# Patient Record
Sex: Male | Born: 1935
Health system: Southern US, Community
[De-identification: ages and names within clinical notes are randomized; demographics above are authoritative.]

## PROBLEM LIST (undated history)

## (undated) DIAGNOSIS — D649 Anemia, unspecified: Secondary | ICD-10-CM

## (undated) DIAGNOSIS — J449 Chronic obstructive pulmonary disease, unspecified: Secondary | ICD-10-CM

## (undated) DIAGNOSIS — Z992 Dependence on renal dialysis: Secondary | ICD-10-CM

## (undated) DIAGNOSIS — N189 Chronic kidney disease, unspecified: Secondary | ICD-10-CM

## (undated) DIAGNOSIS — M1990 Unspecified osteoarthritis, unspecified site: Secondary | ICD-10-CM

## (undated) DIAGNOSIS — I10 Essential (primary) hypertension: Secondary | ICD-10-CM

## (undated) DIAGNOSIS — R001 Bradycardia, unspecified: Secondary | ICD-10-CM

## (undated) DIAGNOSIS — R011 Cardiac murmur, unspecified: Secondary | ICD-10-CM

## (undated) DIAGNOSIS — I2510 Atherosclerotic heart disease of native coronary artery without angina pectoris: Secondary | ICD-10-CM

## (undated) DIAGNOSIS — Z9889 Other specified postprocedural states: Secondary | ICD-10-CM

## (undated) DIAGNOSIS — K922 Gastrointestinal hemorrhage, unspecified: Secondary | ICD-10-CM

## (undated) DIAGNOSIS — J9610 Chronic respiratory failure, unspecified whether with hypoxia or hypercapnia: Secondary | ICD-10-CM

## (undated) DIAGNOSIS — K219 Gastro-esophageal reflux disease without esophagitis: Secondary | ICD-10-CM

## (undated) DIAGNOSIS — L409 Psoriasis, unspecified: Secondary | ICD-10-CM

## (undated) DIAGNOSIS — E119 Type 2 diabetes mellitus without complications: Secondary | ICD-10-CM

## (undated) DIAGNOSIS — R55 Syncope and collapse: Secondary | ICD-10-CM

## (undated) DIAGNOSIS — E7800 Pure hypercholesterolemia, unspecified: Secondary | ICD-10-CM

## (undated) DIAGNOSIS — I4430 Unspecified atrioventricular block: Secondary | ICD-10-CM

## (undated) DIAGNOSIS — N186 End stage renal disease: Secondary | ICD-10-CM

## (undated) HISTORY — DX: Pure hypercholesterolemia, unspecified: E78.00

## (undated) HISTORY — DX: Essential (primary) hypertension: I10

## (undated) HISTORY — DX: Unspecified atrioventricular block: I44.30

## (undated) HISTORY — DX: Bradycardia, unspecified: R00.1

## (undated) HISTORY — DX: Type 2 diabetes mellitus without complications: E11.9

## (undated) HISTORY — DX: Chronic respiratory failure, unspecified whether with hypoxia or hypercapnia: J96.10

## (undated) HISTORY — DX: Psoriasis, unspecified: L40.9

## (undated) HISTORY — DX: Other specified postprocedural states: Z98.890

## (undated) HISTORY — DX: Unspecified osteoarthritis, unspecified site: M19.90

## (undated) HISTORY — DX: End stage renal disease: N18.6

## (undated) HISTORY — DX: Syncope and collapse: R55

## (undated) HISTORY — DX: Anemia, unspecified: D64.9

## (undated) HISTORY — DX: Atherosclerotic heart disease of native coronary artery without angina pectoris: I25.10

## (undated) HISTORY — DX: Gastrointestinal hemorrhage, unspecified: K92.2

## (undated) HISTORY — PX: VASECTOMY: SHX75

## (undated) HISTORY — DX: Chronic obstructive pulmonary disease, unspecified: J44.9

---

## 2007-01-25 HISTORY — PX: CARDIAC CATHETERIZATION: SHX172

## 2011-01-28 ENCOUNTER — Other Ambulatory Visit: Payer: Self-pay | Admitting: Family Medicine

## 2011-01-28 ENCOUNTER — Ambulatory Visit
Admission: RE | Admit: 2011-01-28 | Discharge: 2011-01-28 | Disposition: A | Discharge status: Home / Self Care | Payer: Medicare Other | Source: Ambulatory Visit | Attending: Family Medicine | Admitting: Family Medicine

## 2011-01-28 DIAGNOSIS — M25559 Pain in unspecified hip: Secondary | ICD-10-CM | POA: Diagnosis not present

## 2011-01-28 DIAGNOSIS — M25552 Pain in left hip: Secondary | ICD-10-CM

## 2011-01-28 DIAGNOSIS — M169 Osteoarthritis of hip, unspecified: Secondary | ICD-10-CM | POA: Diagnosis not present

## 2011-01-28 IMAGING — CR DG HIP (WITH OR WITHOUT PELVIS) 2-3V*L*
2 series · 2 of 2 positions shown · non-contrast
Comparison: None.

CLINICAL DATA: Chronic left hip pain and difficulty ambulating.

LEFT HIP - COMPLETE 2+ VIEW

[view not recorded (1 of 2)]
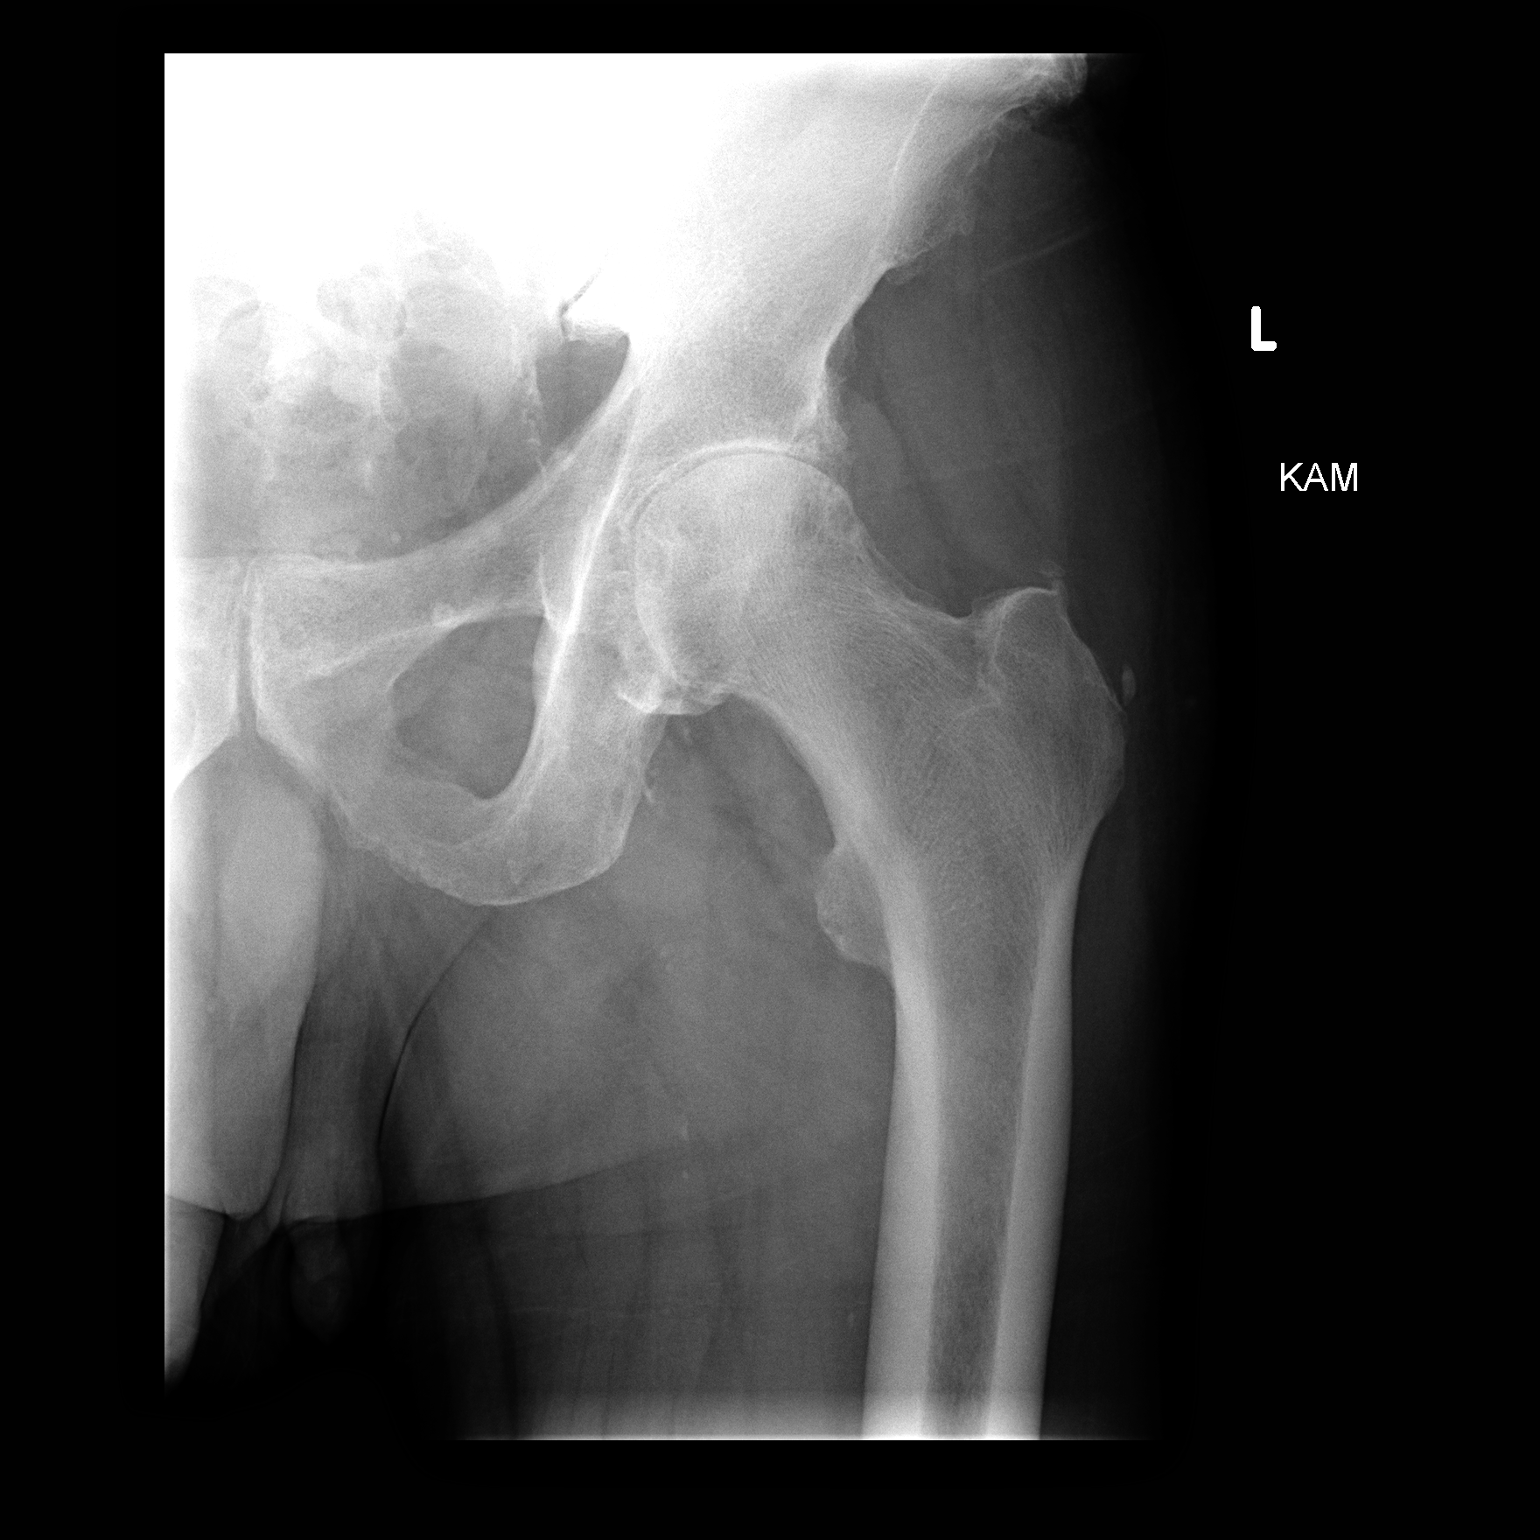

[view not recorded (2 of 2)]
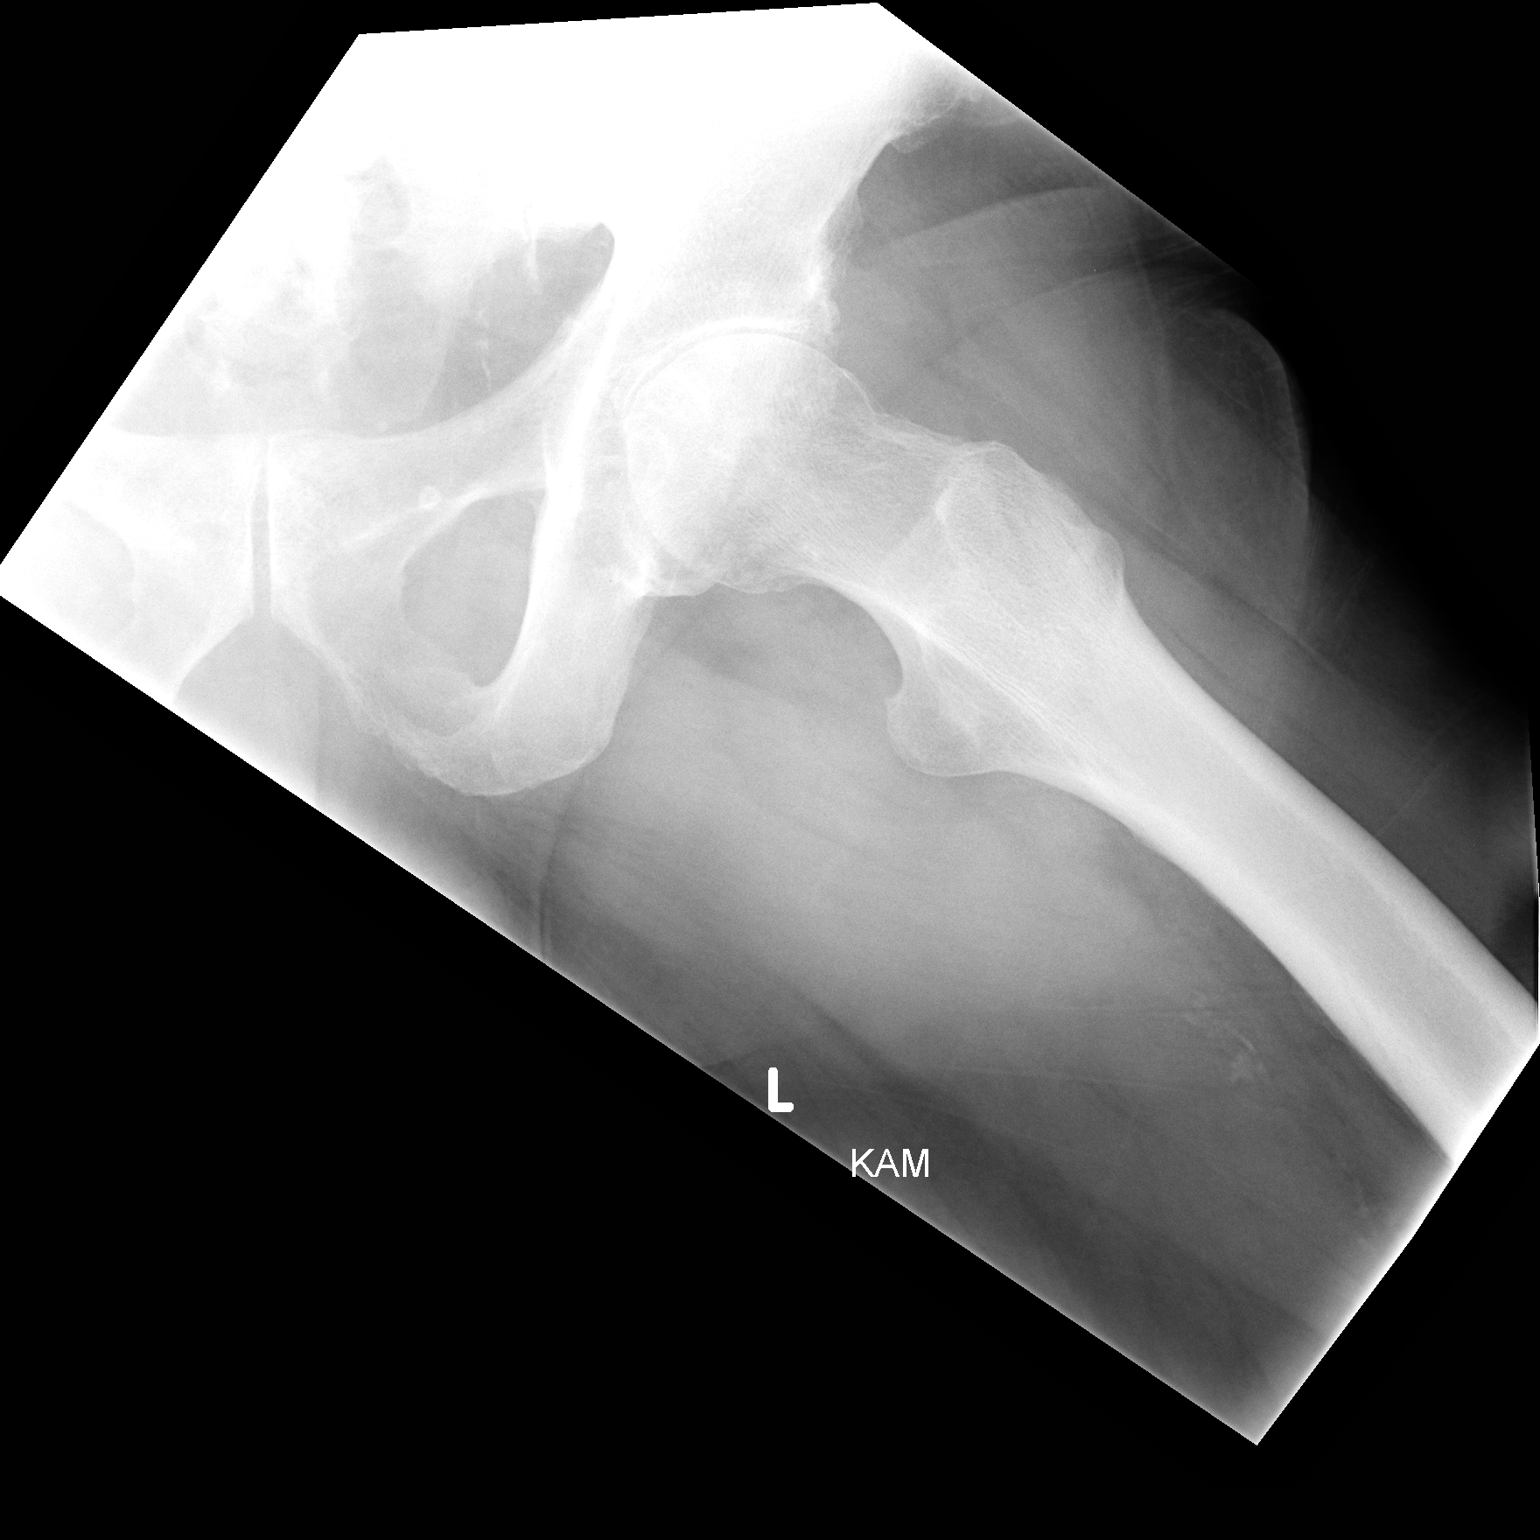

[2 of 2 positions shown; findings below may reference images not displayed]

FINDINGS: No evidence of left hip fracture or dislocation.

Advanced osteoarthritis is seen involving the left hip joint, with
near complete joint space loss.  Mild associated degenerative
sclerosis and spurring also noted.  No other significant bone
abnormality identified.
IMPRESSION: 1.  No acute findings.
2.  Advanced left hip osteoarthritis.

## 2011-02-03 DIAGNOSIS — M25559 Pain in unspecified hip: Secondary | ICD-10-CM | POA: Diagnosis not present

## 2011-02-03 DIAGNOSIS — M169 Osteoarthritis of hip, unspecified: Secondary | ICD-10-CM | POA: Diagnosis not present

## 2011-03-01 DIAGNOSIS — M25559 Pain in unspecified hip: Secondary | ICD-10-CM | POA: Diagnosis not present

## 2011-03-07 DIAGNOSIS — E1159 Type 2 diabetes mellitus with other circulatory complications: Secondary | ICD-10-CM | POA: Diagnosis not present

## 2011-03-07 DIAGNOSIS — B351 Tinea unguium: Secondary | ICD-10-CM | POA: Diagnosis not present

## 2011-03-07 DIAGNOSIS — L851 Acquired keratosis [keratoderma] palmaris et plantaris: Secondary | ICD-10-CM | POA: Diagnosis not present

## 2011-03-31 DIAGNOSIS — H6120 Impacted cerumen, unspecified ear: Secondary | ICD-10-CM | POA: Diagnosis not present

## 2011-05-16 DIAGNOSIS — L851 Acquired keratosis [keratoderma] palmaris et plantaris: Secondary | ICD-10-CM | POA: Diagnosis not present

## 2011-05-16 DIAGNOSIS — B351 Tinea unguium: Secondary | ICD-10-CM | POA: Diagnosis not present

## 2011-05-16 DIAGNOSIS — E1159 Type 2 diabetes mellitus with other circulatory complications: Secondary | ICD-10-CM | POA: Diagnosis not present

## 2011-05-27 DIAGNOSIS — N184 Chronic kidney disease, stage 4 (severe): Secondary | ICD-10-CM | POA: Diagnosis not present

## 2011-07-25 DIAGNOSIS — L851 Acquired keratosis [keratoderma] palmaris et plantaris: Secondary | ICD-10-CM | POA: Diagnosis not present

## 2011-07-25 DIAGNOSIS — E1159 Type 2 diabetes mellitus with other circulatory complications: Secondary | ICD-10-CM | POA: Diagnosis not present

## 2011-07-25 DIAGNOSIS — B351 Tinea unguium: Secondary | ICD-10-CM | POA: Diagnosis not present

## 2011-10-07 DIAGNOSIS — L851 Acquired keratosis [keratoderma] palmaris et plantaris: Secondary | ICD-10-CM | POA: Diagnosis not present

## 2011-10-07 DIAGNOSIS — B351 Tinea unguium: Secondary | ICD-10-CM | POA: Diagnosis not present

## 2011-10-07 DIAGNOSIS — E1159 Type 2 diabetes mellitus with other circulatory complications: Secondary | ICD-10-CM | POA: Diagnosis not present

## 2011-10-24 DIAGNOSIS — Z23 Encounter for immunization: Secondary | ICD-10-CM | POA: Diagnosis not present

## 2011-10-31 DIAGNOSIS — L851 Acquired keratosis [keratoderma] palmaris et plantaris: Secondary | ICD-10-CM | POA: Diagnosis not present

## 2011-10-31 DIAGNOSIS — B351 Tinea unguium: Secondary | ICD-10-CM | POA: Diagnosis not present

## 2011-10-31 DIAGNOSIS — E1159 Type 2 diabetes mellitus with other circulatory complications: Secondary | ICD-10-CM | POA: Diagnosis not present

## 2011-11-15 DIAGNOSIS — L03039 Cellulitis of unspecified toe: Secondary | ICD-10-CM | POA: Diagnosis not present

## 2011-11-30 DIAGNOSIS — Z09 Encounter for follow-up examination after completed treatment for conditions other than malignant neoplasm: Secondary | ICD-10-CM | POA: Diagnosis not present

## 2011-12-19 DIAGNOSIS — L851 Acquired keratosis [keratoderma] palmaris et plantaris: Secondary | ICD-10-CM | POA: Diagnosis not present

## 2011-12-19 DIAGNOSIS — E1159 Type 2 diabetes mellitus with other circulatory complications: Secondary | ICD-10-CM | POA: Diagnosis not present

## 2011-12-19 DIAGNOSIS — B351 Tinea unguium: Secondary | ICD-10-CM | POA: Diagnosis not present

## 2012-02-27 DIAGNOSIS — B351 Tinea unguium: Secondary | ICD-10-CM | POA: Diagnosis not present

## 2012-02-27 DIAGNOSIS — E1159 Type 2 diabetes mellitus with other circulatory complications: Secondary | ICD-10-CM | POA: Diagnosis not present

## 2012-02-27 DIAGNOSIS — L851 Acquired keratosis [keratoderma] palmaris et plantaris: Secondary | ICD-10-CM | POA: Diagnosis not present

## 2012-03-28 ENCOUNTER — Encounter (INDEPENDENT_AMBULATORY_CARE_PROVIDER_SITE_OTHER): Payer: Self-pay | Admitting: General Surgery

## 2012-03-28 DIAGNOSIS — K4090 Unilateral inguinal hernia, without obstruction or gangrene, not specified as recurrent: Secondary | ICD-10-CM | POA: Diagnosis not present

## 2012-03-28 DIAGNOSIS — Z1331 Encounter for screening for depression: Secondary | ICD-10-CM | POA: Diagnosis not present

## 2012-03-29 ENCOUNTER — Encounter (INDEPENDENT_AMBULATORY_CARE_PROVIDER_SITE_OTHER): Payer: Self-pay | Admitting: General Surgery

## 2012-03-29 ENCOUNTER — Ambulatory Visit (INDEPENDENT_AMBULATORY_CARE_PROVIDER_SITE_OTHER): Payer: Medicare Other | Admitting: General Surgery

## 2012-03-29 VITALS — BP 115/72 | HR 70 | Temp 98.0°F | Resp 16 | Wt 180.6 lb

## 2012-03-29 DIAGNOSIS — K4030 Unilateral inguinal hernia, with obstruction, without gangrene, not specified as recurrent: Secondary | ICD-10-CM

## 2012-03-29 MED ORDER — TAMSULOSIN HCL 0.4 MG PO CAPS: 0.4000 mg | ORAL_CAPSULE | Freq: Every day | ORAL | Status: DC

## 2012-03-29 NOTE — Patient Instructions (Signed)
Pick up prescription at pharmacy and start taking 2 days before surgery Do not take Levitra while taking flomax  Hernia A hernia occurs when an internal organ pushes out through a weak spot in the abdominal wall. Hernias most commonly occur in the groin and around the navel. Hernias often can be pushed back into place (reduced). Most hernias tend to get worse over time. Some abdominal hernias can get stuck in the opening (irreducible or incarcerated hernia) and cannot be reduced. An irreducible abdominal hernia which is tightly squeezed into the opening is at risk for impaired blood supply (strangulated hernia). A strangulated hernia is a medical emergency. Because of the risk for an irreducible or strangulated hernia, surgery may be recommended to repair a hernia. CAUSES   Heavy lifting.  Prolonged coughing.  Straining to have a bowel movement.  A cut (incision) made during an abdominal surgery. HOME CARE INSTRUCTIONS   Bed rest is not required. You may continue your normal activities.  Avoid lifting more than 10 pounds (4.5 kg) or straining.  Cough gently. If you are a smoker it is best to stop. Even the best hernia repair can break down with the continual strain of coughing. Even if you do not have your hernia repaired, a cough will continue to aggravate the problem.  Do not wear anything tight over your hernia. Do not try to keep it in with an outside bandage or truss. These can damage abdominal contents if they are trapped within the hernia sac.  Eat a normal diet.  Avoid constipation. Straining over long periods of time will increase hernia size and encourage breakdown of repairs. If you cannot do this with diet alone, stool softeners may be used. SEEK IMMEDIATE MEDICAL CARE IF:   You have a fever.  You develop increasing abdominal pain.  You feel nauseous or vomit.  Your hernia is stuck outside the abdomen, looks discolored, feels hard, or is tender.  You have any changes  in your bowel habits or in the hernia that are unusual for you.  You have increased pain or swelling around the hernia.  You cannot push the hernia back in place by applying gentle pressure while lying down. MAKE SURE YOU:   Understand these instructions.  Will watch your condition.  Will get help right away if you are not doing well or get worse. Document Released: 01/10/2005 Document Revised: 04/04/2011 Document Reviewed: 08/30/2007 [facility] Patient Information 2013 [place].

## 2012-03-29 NOTE — Progress Notes (Signed)
Patient ID: Nicholas [name], male   DOB: Jan 03, 1931, 79 y.o.   MRN: [id number]  No chief complaint on file.   HPI Nicholas [name] is a 79 y.o. male.   HPI 79 year old African American male referred by Dr. Carol Ada for evaluation of a large right scrotal hernia. The patient states that he was diagnosed with an inguinal hernia in 2001. He states that he was told by his [place] physician that he did not need it repaired since it is asymptomatic. He still denies any pain or tenderness in his right groin area. However he states that it is gotten larger over time. He denies any nausea, vomiting, diarrhea or constipation. He denies any numbness or tingling in his right groin. He has had a prior vasectomy. He denies any weight loss. He does complain of urinary issues such as weak stream and urinating in the middle the night.   Past Medical History  Diagnosis Date  . Diabetes mellitus   . Hypertension   . Hypercholesteremia   . Psoriasis   . Arthritis     History reviewed. No pertinent past surgical history.  Family History  Problem Relation Age of Onset  . Stroke Father   . Diabetes Mother   . Diabetes Sister   . Diabetes Brother     Social History History  Substance Use Topics  . Smoking status: Former Smoker    Quit date: 03/29/1987  . Smokeless tobacco: Not on file  . Alcohol Use: No    No Known Allergies  Current Outpatient Prescriptions  Medication Sig Dispense Refill  . amLODipine (NORVASC) 10 MG tablet Take 10 mg by mouth daily.      . ASCORBIC ACID PO Take by mouth.      Marland Kitchen aspirin 325 MG tablet Take 325 mg by mouth daily.      . COD LIVER OIL PO Take by mouth.      . diltiazem (DILACOR XR) 180 MG 24 hr capsule Take 180 mg by mouth daily.      Marland Kitchen ezetimibe-simvastatin (VYTORIN) 10-40 MG per tablet Take 1 tablet by mouth at bedtime.      . ferrous sulfate 325 (65 FE) MG tablet Take 325 mg by mouth daily with breakfast.      . insulin NPH-insulin regular (NOVOLIN 70/30) (70-30)  100 UNIT/ML injection Inject into the skin.      Marland Kitchen labetalol (NORMODYNE) 200 MG tablet Take 200 mg by mouth 2 (two) times daily.      Marland Kitchen lisinopril (PRINIVIL,ZESTRIL) 40 MG tablet Take by mouth daily.      . mupirocin nasal ointment (BACTROBAN) 2 % Place into the nose 2 (two) times daily. Use one-half of tube in each nostril twice daily for five (5) days. After application, press sides of nose together and gently massage.      . niacin 500 MG tablet Take 500 mg by mouth daily with breakfast.      . triamcinolone cream (KENALOG) 0.1 % Apply topically 2 (two) times daily.      . vardenafil (LEVITRA) 10 MG tablet Take 10 mg by mouth daily as needed for erectile dysfunction.      . tamsulosin (FLOMAX) 0.4 MG CAPS Take 1 capsule (0.4 mg total) by mouth daily. Start taking 2 days before surgery  10 capsule  0   No current facility-administered medications for this visit.    Review of Systems Review of Systems  Constitutional: Negative for fever, chills, appetite change and unexpected weight change.  HENT: Negative for congestion and trouble swallowing.        +glasses  Eyes: Negative for visual disturbance.  Respiratory: Negative for chest tightness and shortness of breath.   Cardiovascular: Negative for chest pain and leg swelling.       No PND, no orthopnea, no DOE  Gastrointestinal: Negative for nausea, abdominal pain, diarrhea and constipation.       See HPI  Genitourinary: Negative for dysuria and hematuria.       +nocturia, +weak stream  Musculoskeletal: Negative.   Skin: Negative for rash.  Neurological: Negative for seizures and speech difficulty.       No tias, no amaurosis fugax  Hematological: Does not bruise/bleed easily.  Psychiatric/Behavioral: Negative for behavioral problems and confusion.    Blood pressure 115/72, pulse 70, temperature 98 F (36.7 C), temperature source Temporal, resp. rate 16, weight 180 lb 9.6 oz (81.92 kg).  Physical Exam Physical Exam  Vitals  reviewed. Constitutional: He is oriented to person, place, and time. He appears well-developed and well-nourished. No distress.  HENT:  Head: Normocephalic and atraumatic.  Right Ear: External ear normal.  Left Ear: External ear normal.  Eyes: Conjunctivae are normal. No scleral icterus.  Neck: Neck supple. No tracheal deviation present. No thyromegaly present.  Cardiovascular: Normal rate, regular rhythm and normal heart sounds.   Pulmonary/Chest: Effort normal and breath sounds normal. No respiratory distress. He has no wheezes.  Abdominal: Soft. He exhibits no distension. There is no tenderness. There is no rebound and no guarding. A hernia is present. Hernia confirmed positive in the right inguinal area. Hernia confirmed negative in the left inguinal area.    Genitourinary: Penis normal. Left testis shows no mass.  Large right inguinal/scrotal hernia. Not able to reduce. Soft, nontender. Difficult to palpate Rt testicle  Lymphadenopathy:    He has no cervical adenopathy.       Right: No inguinal adenopathy present.       Left: No inguinal adenopathy present.  Neurological: He is alert and oriented to person, place, and time.  Skin: Skin is warm and dry. Rash noted. He is not diaphoretic. No erythema.     psoriasis dorsum L hand  Psychiatric: He has a normal mood and affect. His behavior is normal. Judgment and thought content normal.    Data Reviewed Dr Thompson Caul note  Assessment    Right incarcerated inguinal hernia     Plan    We discussed the etiology of inguinal hernias. We discussed the signs & symptoms of incarceration & strangulation.  We discussed non-operative and operative management.  The patient has elected to proceed with OPEN REPAIR OF RIGHT INCARCERATED INGUINAL HERNIA WITH MESH   I described the procedure in detail.  The patient was given educational material. We discussed the risks and benefits including but not limited to bleeding, infection, chronic  inguinal pain, nerve entrapment, hernia recurrence, mesh complications, hematoma formation, urinary retention, injury to the testicles or the ovaries, numbness in the groin, blood clots, injury to the surrounding structures, and anesthesia risk. We also discussed the typical post operative recovery course, including no heavy lifting for 6 weeks. I explained that the likelihood of improvement of their symptoms is good. I explained that he is at higher risk for recurrence because of the large nature of his inguinal hernia as well as because of his underlying diabetes mellitus. I also explained that he is at higher risk for chronic nerve pain as well as hematoma formation as well as  postoperative urinary retention. We will place him on perioperative Flomax. He was advised not to take Levitra at the same time as taking Flomax.  Leighton Ruff. Redmond Pulling, MD, FACS General, Bariatric, & Minimally Invasive Surgery [facility], [place]          [facility] M 03/29/2012, 10:23 AM

## 2012-03-30 ENCOUNTER — Encounter (HOSPITAL_COMMUNITY): Payer: Self-pay | Admitting: Pharmacy Technician

## 2012-04-09 ENCOUNTER — Other Ambulatory Visit (HOSPITAL_COMMUNITY): Payer: Medicare Other

## 2012-04-10 ENCOUNTER — Encounter (HOSPITAL_COMMUNITY)
Admission: RE | Admit: 2012-04-10 | Discharge: 2012-04-10 | Disposition: A | Discharge status: Home / Self Care | Payer: Medicare Other | Source: Ambulatory Visit | Attending: Anesthesiology | Admitting: Anesthesiology

## 2012-04-10 ENCOUNTER — Encounter (HOSPITAL_COMMUNITY): Payer: Self-pay

## 2012-04-10 ENCOUNTER — Encounter (HOSPITAL_COMMUNITY)
Admission: RE | Admit: 2012-04-10 | Discharge: 2012-04-10 | Disposition: A | Discharge status: Home / Self Care | Payer: Medicare Other | Source: Ambulatory Visit | Attending: General Surgery | Admitting: General Surgery

## 2012-04-10 ENCOUNTER — Other Ambulatory Visit (INDEPENDENT_AMBULATORY_CARE_PROVIDER_SITE_OTHER): Payer: Self-pay | Admitting: General Surgery

## 2012-04-10 DIAGNOSIS — L408 Other psoriasis: Secondary | ICD-10-CM | POA: Diagnosis not present

## 2012-04-10 DIAGNOSIS — Z01818 Encounter for other preprocedural examination: Secondary | ICD-10-CM | POA: Diagnosis not present

## 2012-04-10 DIAGNOSIS — Z01812 Encounter for preprocedural laboratory examination: Secondary | ICD-10-CM | POA: Diagnosis not present

## 2012-04-10 DIAGNOSIS — I10 Essential (primary) hypertension: Secondary | ICD-10-CM | POA: Diagnosis not present

## 2012-04-10 DIAGNOSIS — Z01810 Encounter for preprocedural cardiovascular examination: Secondary | ICD-10-CM | POA: Diagnosis not present

## 2012-04-10 DIAGNOSIS — R918 Other nonspecific abnormal finding of lung field: Secondary | ICD-10-CM | POA: Diagnosis not present

## 2012-04-10 DIAGNOSIS — M129 Arthropathy, unspecified: Secondary | ICD-10-CM | POA: Diagnosis not present

## 2012-04-10 DIAGNOSIS — E119 Type 2 diabetes mellitus without complications: Secondary | ICD-10-CM | POA: Diagnosis not present

## 2012-04-10 DIAGNOSIS — K4030 Unilateral inguinal hernia, with obstruction, without gangrene, not specified as recurrent: Secondary | ICD-10-CM | POA: Diagnosis not present

## 2012-04-10 DIAGNOSIS — E7800 Pure hypercholesterolemia, unspecified: Secondary | ICD-10-CM | POA: Diagnosis not present

## 2012-04-10 HISTORY — DX: Gastro-esophageal reflux disease without esophagitis: K21.9

## 2012-04-10 HISTORY — DX: Cardiac murmur, unspecified: R01.1

## 2012-04-10 HISTORY — DX: Chronic kidney disease, unspecified: N18.9

## 2012-04-10 LAB — BASIC METABOLIC PANEL
BUN: 27 mg/dL — ABNORMAL HIGH (ref 6–23)
GFR calc Af Amer: 27 mL/min — ABNORMAL LOW (ref >90)
GFR calc non Af Amer: 23 mL/min — ABNORMAL LOW (ref >90)
Potassium: 5.1 mEq/L (ref 3.5–5.1)

## 2012-04-10 LAB — CBC WITH DIFFERENTIAL/PLATELET
Basophils Absolute: 0 10*3/uL (ref 0.0–0.1)
Basophils Relative: 0 % (ref 0–1)
Hemoglobin: 10.3 g/dL — ABNORMAL LOW (ref 13.0–17.0)
MCHC: 33.1 g/dL (ref 30.0–36.0)
Monocytes Relative: 10 % (ref 3–12)
Neutro Abs: 4.2 10*3/uL (ref 1.7–7.7)
Neutrophils Relative %: 59 % (ref 43–77)
RDW: 14.7 % (ref 11.5–15.5)

## 2012-04-10 LAB — SURGICAL PCR SCREEN
MRSA, PCR: NEGATIVE
Staphylococcus aureus: POSITIVE — AB

## 2012-04-10 IMAGING — CR DG CHEST 2V
2 series · 2 of 2 positions shown · non-contrast
Comparison: None.

CLINICAL DATA: Hypertension; preoperative hernia surgery

CHEST - 2 VIEW

[view not recorded (1 of 2)]
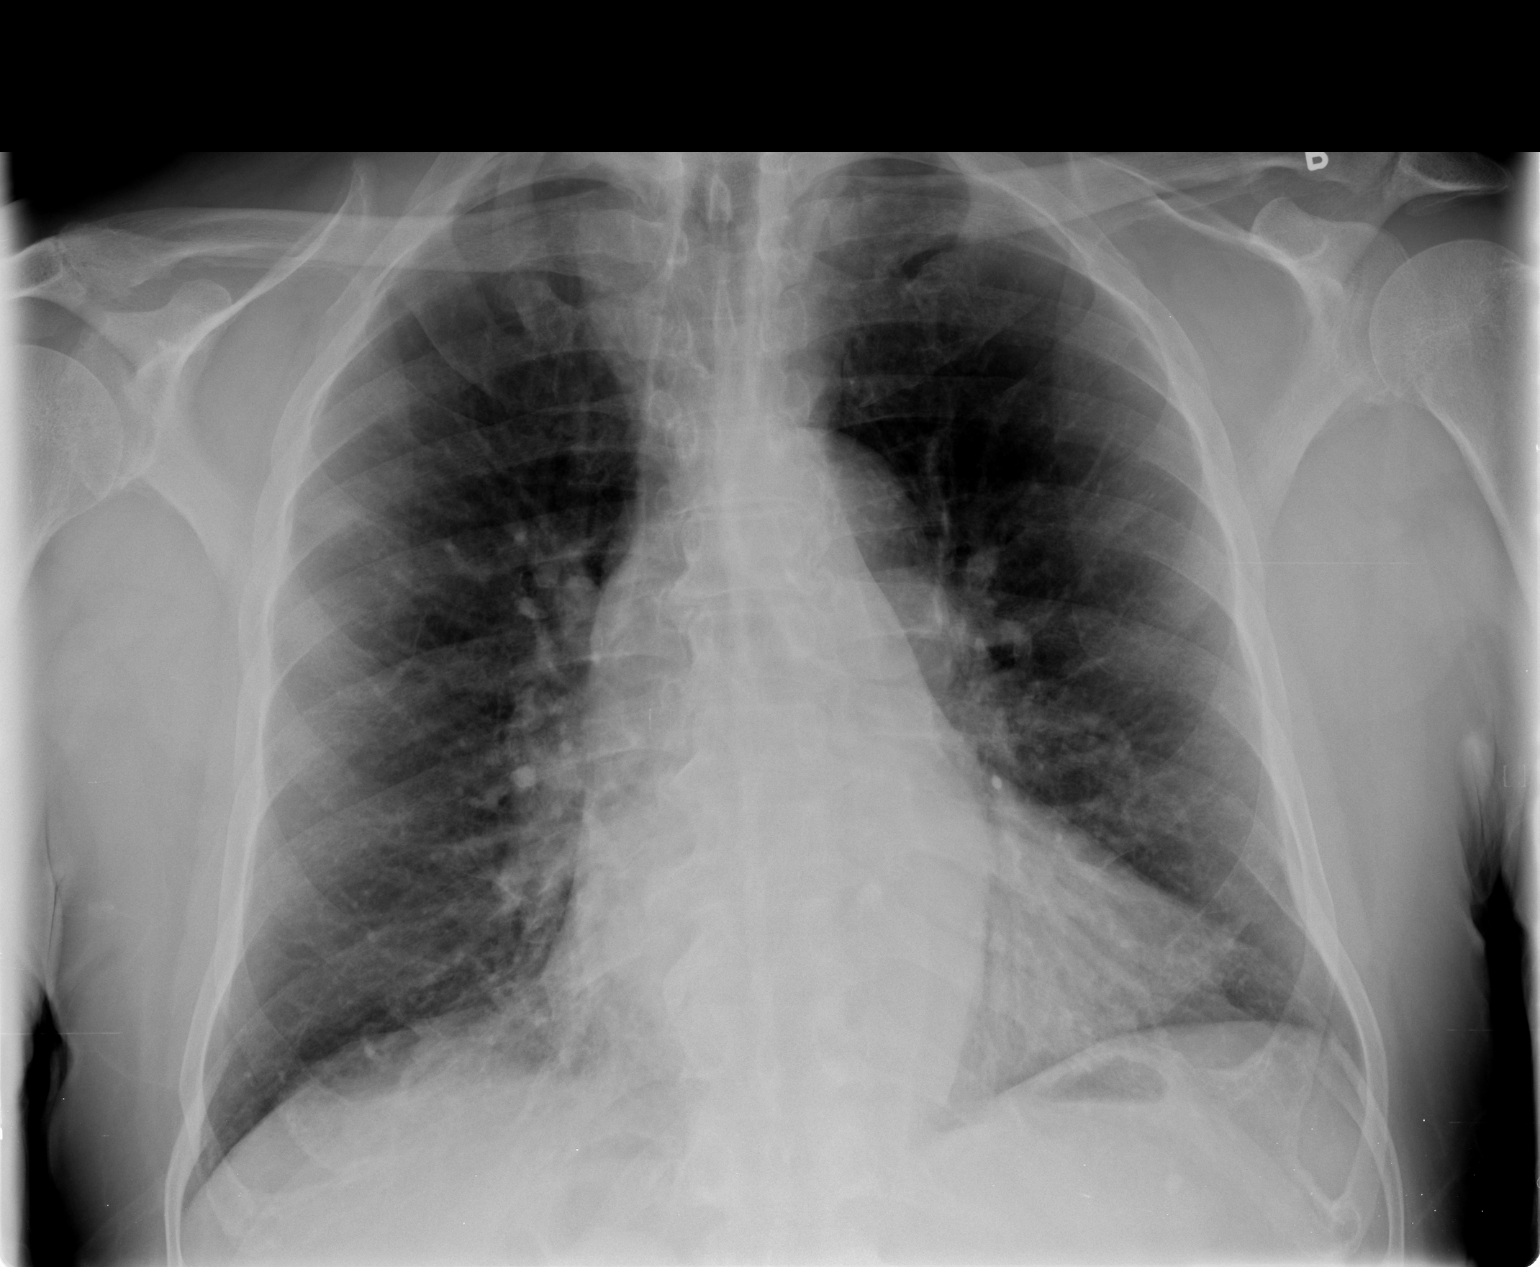

[view not recorded (2 of 2)]
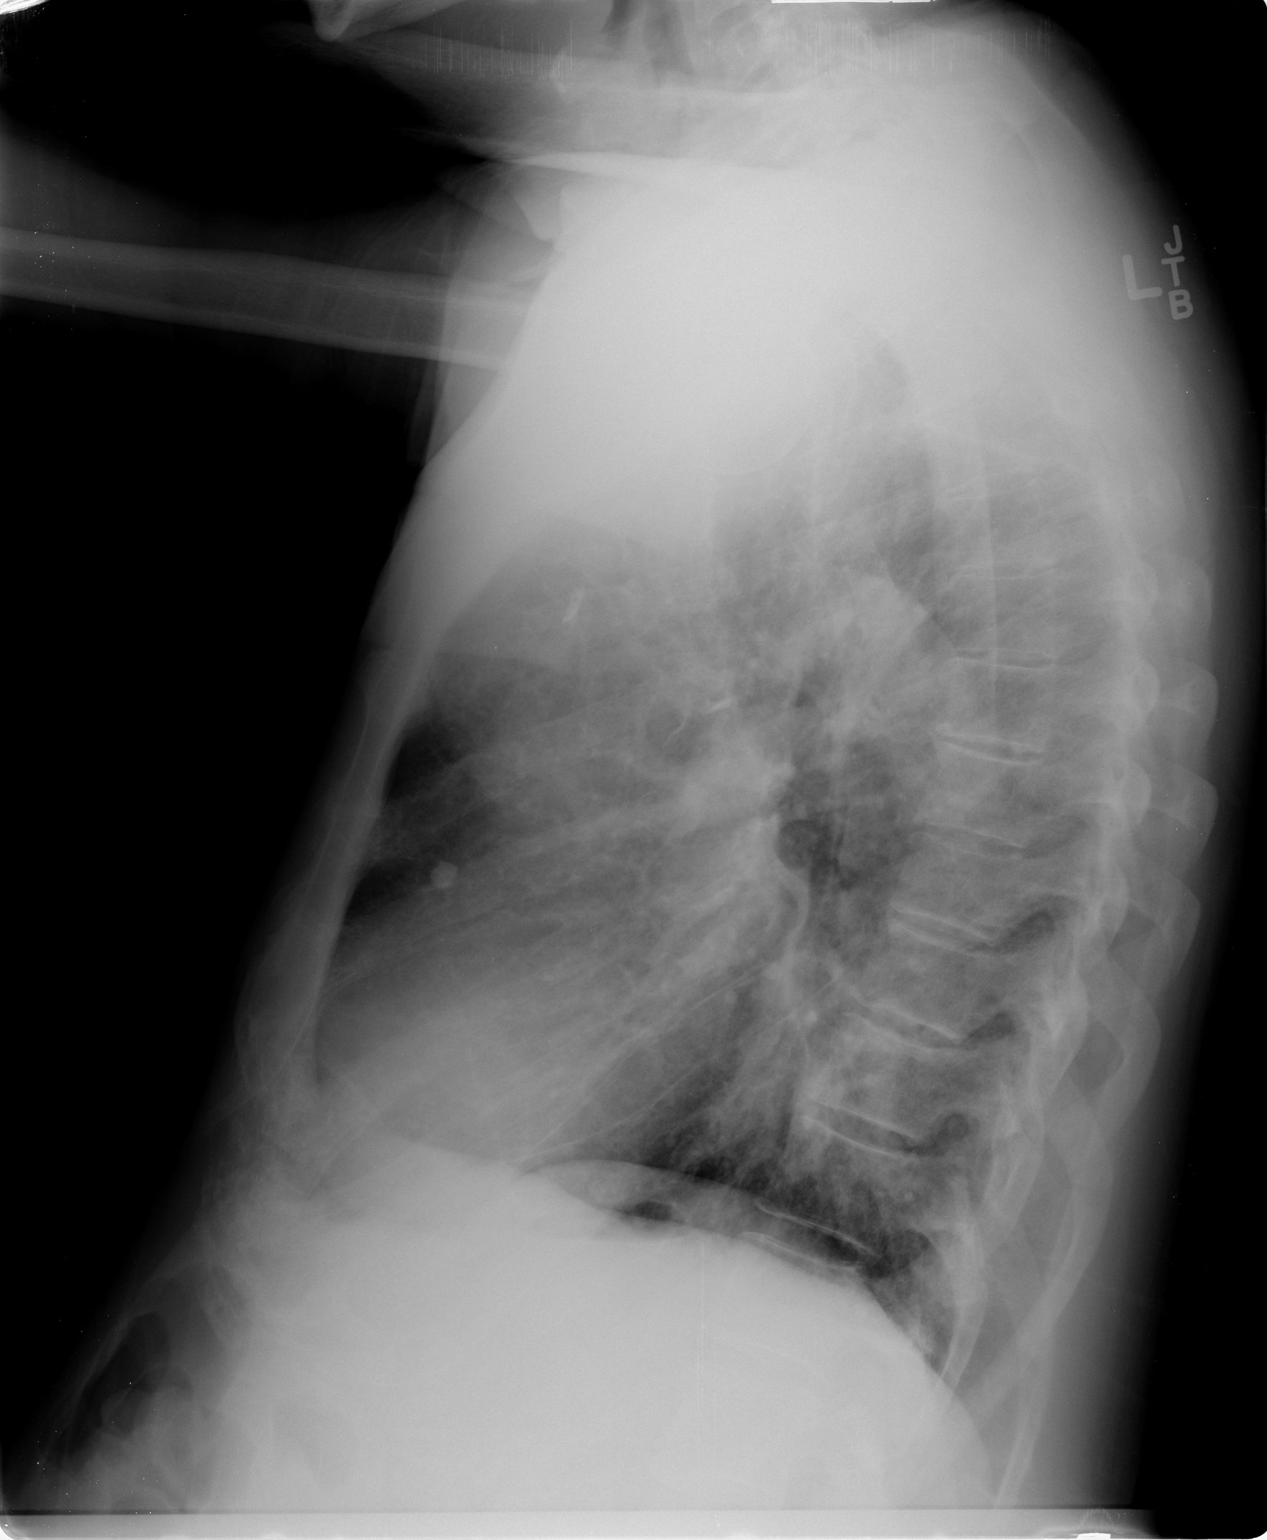

[2 of 2 positions shown; findings below may reference images not displayed]

FINDINGS: There is a 9 mm nodular opacity in the anterior chest
seen only on the lateral view.  Elsewhere, lungs clear.  Heart is
upper normal in size with normal pulmonary vascularity.  No
adenopathy.  No bone lesions.
CONCLUSION: 9 mm nodular opacity anterior chest seen only on
lateral view.  Given this finding, correlation with noncontrast
enhanced chest CT is advised to further assess.  Lungs elsewhere
clear.

## 2012-04-10 MED ORDER — CHLORHEXIDINE GLUCONATE 4 % EX LIQD: 1.0000 "application " | Freq: Once | CUTANEOUS | Status: DC

## 2012-04-10 NOTE — Progress Notes (Signed)
Office called to correct "side" verbage on permit. Spoke with kristin.

## 2012-04-10 NOTE — Pre-Procedure Instructions (Addendum)
Nicholas [name]  04/10/2012   Your procedure is scheduled on: 04/13/12  Report to [place] at 530 AM.  Call this number if you have problems the morning of surgery: [phone]   Remember:   Do not eat food or drink liquids after midnight.   Take these medicines the morning of surgery with A SIP OF WATER:tamulosin  STOPaspirin now   No insulin am of surgery   Do not wear jewelry, make-up or nail polish.  Do not wear lotions, powders, or perfumes. You may wear deodorant.  Do not shave 48 hours prior to surgery. Men may shave face and neck.  Do not bring valuables to the hospital.  Contacts, dentures or bridgework may not be worn into surgery.  Leave suitcase in the car. After surgery it may be brought to your room.  For patients admitted to the hospital, checkout time is 11:00 AM the day of  discharge.   Patients discharged the day of surgery will not be allowed to drive  home.  Name and phone number of your driver:   Special Instructions: Shower using CHG 2 nights before surgery and the night before surgery.  If you shower the day of surgery use CHG.  Use special wash - you have one bottle of CHG for all showers.  You should use approximately 1/3 of the bottle for each shower.   Please read over the following fact sheets that you were given: Pain Booklet, Coughing and Deep Breathing, MRSA Information and Surgical Site Infection Prevention

## 2012-04-11 ENCOUNTER — Telehealth (INDEPENDENT_AMBULATORY_CARE_PROVIDER_SITE_OTHER): Payer: Self-pay | Admitting: General Surgery

## 2012-04-11 NOTE — Telephone Encounter (Signed)
Left message on machine for patient to call back and ask for me. To see which [place] [facility] he is seen at and when the last time prior to his preoperative labs that he remembers having labs drawn. Awaiting call back.

## 2012-04-11 NOTE — Telephone Encounter (Signed)
Patient called back. I spoke with him and made him aware of abnormal labs and not having a lab history on him. He goes to the [place] in [place]. He is aware we need a signature release to get him labs from the [place]. HE will come first thing in the morning to sign the release.

## 2012-04-12 ENCOUNTER — Encounter (INDEPENDENT_AMBULATORY_CARE_PROVIDER_SITE_OTHER): Payer: Self-pay

## 2012-04-12 ENCOUNTER — Encounter (HOSPITAL_COMMUNITY): Payer: Self-pay

## 2012-04-12 MED ORDER — CEFAZOLIN SODIUM-DEXTROSE 2-3 GM-% IV SOLR
2.0000 g | INTRAVENOUS | Status: AC
  Administered 2012-04-13: 2 g via INTRAVENOUS
  Filled 2012-04-12: qty 50

## 2012-04-12 NOTE — Telephone Encounter (Signed)
Patient signed medical release. Called and spoke to [place] at [phone] ext [phone] and she stated if I faxed the signed release to [phone] with all the information provided she would get me the lab reports today. I faxed release and confirmation received. Awaiting lab results from November lab draw at his [place] appt.

## 2012-04-12 NOTE — Progress Notes (Signed)
Anesthesia chart review: Patient is a 79 year old male posted for right inguinal hernia repair by Dr. Redmond Pulling on 04/13/2012. History includes former smoker, diabetes mellitus type 2, psoriasis, GERD, hypertension, arthritis, hypercholesterolemia, murmur (not specified).  By lab trends, he also has underlying CKD.  He primarily receives care through the [facility].  He reports a cardiac cath approximately 5 years ago [facility]) that showed "some blockages" with collaterals and did not require intervention.  He reports he saw a cardiologist once following the cath and was told he would not need routine cardiology follow-up unless he developed any new cardiac issues/symptoms.  EKG on 04/10/12 showed NSR.  Currently, he and his wife are involved in a weekly bowling league.  They also exercise 3X/week doing tone down aerobics for seniors.  He denies chest pain, SOB at rest, LE edema.  He does get DOE with moderate activity such as going up stairs.    CXR on 04/10/12 showed: Conclusion: 9 mm nodular opacity anterior chest seen only on lateral view. Given this finding, correlation with noncontrast enhanced chest CT is advised to further assess. Lungs elsewhere clear. (I confirmed with Jade at [place] that Dr. Redmond Pulling is aware of CXR results and plans to discuss with patient post-operatively.)  Labs noted.  BUN/Cr 27/2.49, glucose 109.  H/H 10.3, 31.1.  Received comparison labs from [place] (obtained from the [facility]) done on 10/04/11 that show his BUN/Cr were 40/2.7.    He reports a cath five years ago with medical therapy recommended.  He is not having any acute cardiopulmonary symptoms and stays reasonable active.  His EKG shows NSR.  His renal function appears stable for at least the past six months.  Dr. Redmond Pulling is aware of lab and CXR results and would like to proceed.  If there are no significant changes in his status then would anticipate he could proceed as planned.  He will be evaluated by his assigned  anesthesiologist on the day of surgery.  George Hugh [facility] Short Stay Center/Anesthesiology Phone [phone] 04/12/2012 11:56 AM

## 2012-04-12 NOTE — Telephone Encounter (Signed)
Labs received. BUN/Creatinine elevated on September 2013 labs. Okay to proceed with surgery per Dr Redmond Pulling. Patient made aware. Ebony Hail, [place] made aware. Labs faxed to [facility] at [phone] and scanned to system.

## 2012-04-13 ENCOUNTER — Encounter (HOSPITAL_COMMUNITY)
Admission: RE | Disposition: A | Discharge status: Home / Self Care | Payer: Self-pay | Source: Ambulatory Visit | Attending: General Surgery

## 2012-04-13 ENCOUNTER — Ambulatory Visit (HOSPITAL_COMMUNITY): Payer: Medicare Other | Admitting: Anesthesiology

## 2012-04-13 ENCOUNTER — Encounter (HOSPITAL_COMMUNITY): Payer: Self-pay | Admitting: *Deleted

## 2012-04-13 ENCOUNTER — Encounter (HOSPITAL_COMMUNITY): Payer: Self-pay | Admitting: Vascular Surgery

## 2012-04-13 ENCOUNTER — Ambulatory Visit (HOSPITAL_COMMUNITY)
Admission: RE | Admit: 2012-04-13 | Discharge: 2012-04-13 | Disposition: A | Discharge status: Home / Self Care | Payer: Medicare Other | Source: Ambulatory Visit | Attending: General Surgery | Admitting: General Surgery

## 2012-04-13 DIAGNOSIS — M129 Arthropathy, unspecified: Secondary | ICD-10-CM | POA: Diagnosis not present

## 2012-04-13 DIAGNOSIS — L408 Other psoriasis: Secondary | ICD-10-CM | POA: Diagnosis not present

## 2012-04-13 DIAGNOSIS — K4030 Unilateral inguinal hernia, with obstruction, without gangrene, not specified as recurrent: Secondary | ICD-10-CM | POA: Insufficient documentation

## 2012-04-13 DIAGNOSIS — I10 Essential (primary) hypertension: Secondary | ICD-10-CM | POA: Insufficient documentation

## 2012-04-13 DIAGNOSIS — Z01818 Encounter for other preprocedural examination: Secondary | ICD-10-CM | POA: Insufficient documentation

## 2012-04-13 DIAGNOSIS — K4090 Unilateral inguinal hernia, without obstruction or gangrene, not specified as recurrent: Secondary | ICD-10-CM | POA: Diagnosis not present

## 2012-04-13 DIAGNOSIS — Z01812 Encounter for preprocedural laboratory examination: Secondary | ICD-10-CM | POA: Insufficient documentation

## 2012-04-13 DIAGNOSIS — E7800 Pure hypercholesterolemia, unspecified: Secondary | ICD-10-CM | POA: Insufficient documentation

## 2012-04-13 DIAGNOSIS — E119 Type 2 diabetes mellitus without complications: Secondary | ICD-10-CM | POA: Insufficient documentation

## 2012-04-13 DIAGNOSIS — Z01810 Encounter for preprocedural cardiovascular examination: Secondary | ICD-10-CM | POA: Insufficient documentation

## 2012-04-13 DIAGNOSIS — K219 Gastro-esophageal reflux disease without esophagitis: Secondary | ICD-10-CM | POA: Diagnosis not present

## 2012-04-13 HISTORY — PX: INSERTION OF MESH: SHX5868

## 2012-04-13 HISTORY — PX: INGUINAL HERNIA REPAIR: SHX194

## 2012-04-13 LAB — GLUCOSE, CAPILLARY
Glucose-Capillary: 98 mg/dL (ref 70–99)
Glucose-Capillary: 102 mg/dL — ABNORMAL HIGH (ref 70–99)
Glucose-Capillary: 123 mg/dL — ABNORMAL HIGH (ref 70–99)

## 2012-04-13 SURGERY — REPAIR, HERNIA, INGUINAL, INCARCERATED
Anesthesia: General | Site: Groin | Laterality: Right | Wound class: Clean

## 2012-04-13 MED ORDER — ROCURONIUM BROMIDE 100 MG/10ML IV SOLN
INTRAVENOUS | Status: DC | PRN
  Administered 2012-04-13: 5 mg via INTRAVENOUS
  Administered 2012-04-13: 10 mg via INTRAVENOUS
  Administered 2012-04-13: 30 mg via INTRAVENOUS

## 2012-04-13 MED ORDER — ONDANSETRON HCL 4 MG/2ML IJ SOLN: 4.0000 mg | Freq: Four times a day (QID) | INTRAMUSCULAR | Status: DC | PRN

## 2012-04-13 MED ORDER — LIDOCAINE HCL (CARDIAC) 20 MG/ML IV SOLN
INTRAVENOUS | Status: DC | PRN
  Administered 2012-04-13: 100 mg via INTRAVENOUS

## 2012-04-13 MED ORDER — NEOSTIGMINE METHYLSULFATE 1 MG/ML IJ SOLN
INTRAMUSCULAR | Status: DC | PRN
  Administered 2012-04-13: 3 mg via INTRAVENOUS

## 2012-04-13 MED ORDER — ACETAMINOPHEN 10 MG/ML IV SOLN
1000.0000 mg | Freq: Once | INTRAVENOUS | Status: AC
  Administered 2012-04-13: 1000 mg via INTRAVENOUS

## 2012-04-13 MED ORDER — GLYCOPYRROLATE 0.2 MG/ML IJ SOLN
INTRAMUSCULAR | Status: DC | PRN
  Administered 2012-04-13: 0.4 mg via INTRAVENOUS

## 2012-04-13 MED ORDER — LIDOCAINE HCL 4 % MT SOLN
OROMUCOSAL | Status: DC | PRN
  Administered 2012-04-13: 4 mL via TOPICAL

## 2012-04-13 MED ORDER — ACETAMINOPHEN 10 MG/ML IV SOLN
INTRAVENOUS | Status: AC
  Filled 2012-04-13: qty 100

## 2012-04-13 MED ORDER — OXYCODONE-ACETAMINOPHEN 5-325 MG PO TABS: 1.0000 | ORAL_TABLET | ORAL | Status: DC | PRN

## 2012-04-13 MED ORDER — LACTATED RINGERS IV SOLN: INTRAVENOUS | Status: DC | PRN

## 2012-04-13 MED ORDER — SODIUM CHLORIDE 0.9 % IR SOLN
Status: DC | PRN
  Administered 2012-04-13: 08:00:00

## 2012-04-13 MED ORDER — 0.9 % SODIUM CHLORIDE (POUR BTL) OPTIME
TOPICAL | Status: DC | PRN
  Administered 2012-04-13: 1000 mL

## 2012-04-13 MED ORDER — FENTANYL CITRATE 0.05 MG/ML IJ SOLN
INTRAMUSCULAR | Status: DC | PRN
  Administered 2012-04-13: 150 ug via INTRAVENOUS

## 2012-04-13 MED ORDER — ONDANSETRON HCL 4 MG/2ML IJ SOLN: 4.0000 mg | Freq: Once | INTRAMUSCULAR | Status: DC | PRN

## 2012-04-13 MED ORDER — SODIUM CHLORIDE 0.9 % IV SOLN
INTRAVENOUS | Status: DC | PRN
  Administered 2012-04-13 (×2): via INTRAVENOUS

## 2012-04-13 MED ORDER — EPHEDRINE SULFATE 50 MG/ML IJ SOLN
INTRAMUSCULAR | Status: DC | PRN
  Administered 2012-04-13: 5 mg via INTRAVENOUS
  Administered 2012-04-13: 10 mg via INTRAVENOUS
  Administered 2012-04-13: 5 mg via INTRAVENOUS

## 2012-04-13 MED ORDER — BUPIVACAINE-EPINEPHRINE PF 0.25-1:200000 % IJ SOLN
INTRAMUSCULAR | Status: AC
  Filled 2012-04-13: qty 30

## 2012-04-13 MED ORDER — PROPOFOL 10 MG/ML IV BOLUS
INTRAVENOUS | Status: DC | PRN
  Administered 2012-04-13: 200 mg via INTRAVENOUS

## 2012-04-13 MED ORDER — OXYCODONE HCL 5 MG PO TABS: 5.0000 mg | ORAL_TABLET | ORAL | Status: DC | PRN

## 2012-04-13 MED ORDER — HYDROMORPHONE HCL PF 1 MG/ML IJ SOLN: 0.2500 mg | INTRAMUSCULAR | Status: DC | PRN

## 2012-04-13 MED ORDER — BUPIVACAINE-EPINEPHRINE 0.25% -1:200000 IJ SOLN
INTRAMUSCULAR | Status: DC | PRN
  Administered 2012-04-13: 20 mL

## 2012-04-13 MED ORDER — ONDANSETRON HCL 4 MG/2ML IJ SOLN
INTRAMUSCULAR | Status: DC | PRN
  Administered 2012-04-13: 4 mg via INTRAVENOUS

## 2012-04-13 MED ORDER — LABETALOL HCL 200 MG PO TABS
400.0000 mg | ORAL_TABLET | Freq: Once | ORAL | Status: AC
  Administered 2012-04-13: 400 mg via ORAL
  Filled 2012-04-13 (×2): qty 2

## 2012-04-13 MED ORDER — PHENYLEPHRINE HCL 10 MG/ML IJ SOLN
INTRAMUSCULAR | Status: DC | PRN
  Administered 2012-04-13 (×5): 40 ug via INTRAVENOUS

## 2012-04-13 SURGICAL SUPPLY — 57 items
BAG DECANTER FOR FLEXI CONT (MISCELLANEOUS) ×2 IMPLANT
BENZOIN TINCTURE PRP APPL 2/3 (GAUZE/BANDAGES/DRESSINGS) ×2 IMPLANT
BLADE SURG ROTATE 9660 (MISCELLANEOUS) ×2 IMPLANT
CANISTER SUCTION 2500CC (MISCELLANEOUS) ×2 IMPLANT
CHLORAPREP W/TINT 26ML (MISCELLANEOUS) ×2 IMPLANT
CLOTH BEACON ORANGE TIMEOUT ST (SAFETY) ×2 IMPLANT
COVER SURGICAL LIGHT HANDLE (MISCELLANEOUS) ×2 IMPLANT
DRAIN PENROSE 1/2X12 LTX STRL (WOUND CARE) ×2 IMPLANT
DRAPE INCISE IOBAN 66X45 STRL (DRAPES) ×2 IMPLANT
DRAPE LAPAROSCOPIC ABDOMINAL (DRAPES) IMPLANT
DRAPE PED LAPAROTOMY (DRAPES) ×2 IMPLANT
DRAPE UTILITY 15X26 W/TAPE STR (DRAPE) ×4 IMPLANT
DRSG TEGADERM 4X4.75 (GAUZE/BANDAGES/DRESSINGS) ×2 IMPLANT
ELECT CAUTERY BLADE 6.4 (BLADE) ×2 IMPLANT
ELECT REM PT RETURN 9FT ADLT (ELECTROSURGICAL) ×2
ELECTRODE REM PT RTRN 9FT ADLT (ELECTROSURGICAL) ×1 IMPLANT
GAUZE SPONGE 4X4 16PLY XRAY LF (GAUZE/BANDAGES/DRESSINGS) ×2 IMPLANT
GLOVE BIO SURGEON STRL SZ 6.5 (GLOVE) ×2 IMPLANT
GLOVE BIO SURGEON STRL SZ7 (GLOVE) ×2 IMPLANT
GLOVE BIOGEL M STRL SZ7.5 (GLOVE) ×2 IMPLANT
GLOVE BIOGEL PI IND STRL 7.0 (GLOVE) ×3 IMPLANT
GLOVE BIOGEL PI IND STRL 7.5 (GLOVE) ×2 IMPLANT
GLOVE BIOGEL PI IND STRL 8 (GLOVE) ×1 IMPLANT
GLOVE BIOGEL PI INDICATOR 7.0 (GLOVE) ×3
GLOVE BIOGEL PI INDICATOR 7.5 (GLOVE) ×2
GLOVE BIOGEL PI INDICATOR 8 (GLOVE) ×1
GLOVE ECLIPSE 7.5 STRL STRAW (GLOVE) ×2 IMPLANT
GLOVE SURG SS PI 7.0 STRL IVOR (GLOVE) ×4 IMPLANT
GOWN STRL NON-REIN LRG LVL3 (GOWN DISPOSABLE) ×8 IMPLANT
GOWN STRL REIN XL XLG (GOWN DISPOSABLE) ×2 IMPLANT
KIT BASIN OR (CUSTOM PROCEDURE TRAY) ×2 IMPLANT
KIT ROOM TURNOVER OR (KITS) ×2 IMPLANT
LIGASURE IMPACT 36 18CM CVD LR (INSTRUMENTS) ×2 IMPLANT
MESH ULTRAPRO 3X6 7.6X15CM (Mesh General) ×2 IMPLANT
NEEDLE HYPO 25GX1X1/2 BEV (NEEDLE) ×2 IMPLANT
NS IRRIG 1000ML POUR BTL (IV SOLUTION) ×2 IMPLANT
PACK GENERAL/GYN (CUSTOM PROCEDURE TRAY) ×2 IMPLANT
PAD ARMBOARD 7.5X6 YLW CONV (MISCELLANEOUS) ×4 IMPLANT
SPONGE GAUZE 4X4 12PLY (GAUZE/BANDAGES/DRESSINGS) IMPLANT
SPONGE INTESTINAL PEANUT (DISPOSABLE) ×2 IMPLANT
STAPLER VISISTAT 35W (STAPLE) IMPLANT
STRIP CLOSURE SKIN 1/2X4 (GAUZE/BANDAGES/DRESSINGS) ×2 IMPLANT
SUT MNCRL AB 4-0 PS2 18 (SUTURE) ×2 IMPLANT
SUT NOVA NAB GS-21 0 18 T12 DT (SUTURE) IMPLANT
SUT NOVA NAB GS-21 1 T12 (SUTURE) IMPLANT
SUT PROLENE 2 0 CT2 30 (SUTURE) ×6 IMPLANT
SUT VIC AB 2-0 CT1 36 (SUTURE) ×2 IMPLANT
SUT VIC AB 3-0 SH 18 (SUTURE) ×4 IMPLANT
SUT VIC AB 3-0 SH 27 (SUTURE) ×1
SUT VIC AB 3-0 SH 27XBRD (SUTURE) ×1 IMPLANT
SUT VICRYL AB 3 0 TIES (SUTURE) ×2 IMPLANT
SYR BULB 3OZ (MISCELLANEOUS) ×2 IMPLANT
SYR CONTROL 10ML LL (SYRINGE) ×2 IMPLANT
TOWEL OR 17X24 6PK STRL BLUE (TOWEL DISPOSABLE) ×2 IMPLANT
TOWEL OR 17X26 10 PK STRL BLUE (TOWEL DISPOSABLE) ×2 IMPLANT
TRAY FOLEY CATH 14FRSI W/METER (CATHETERS) IMPLANT
WATER STERILE IRR 1000ML POUR (IV SOLUTION) IMPLANT

## 2012-04-13 NOTE — Transfer of Care (Signed)
Immediate Anesthesia Transfer of Care Note  Patient: Nicholas [name]  Procedure(s) Performed: Procedure(s): HERNIA REPAIR INGUINAL INCARCERATED (Right) INSERTION OF MESH (Right)  Patient Location: PACU  Anesthesia Type:General  Level of Consciousness: awake, alert  and oriented  Airway & Oxygen Therapy: Patient Spontanous Breathing and Patient connected to nasal cannula oxygen  Post-op Assessment: Report given to PACU RN and Post -op Vital signs reviewed and stable  Post vital signs: Reviewed and stable  Complications: No apparent anesthesia complications

## 2012-04-13 NOTE — Preoperative (Addendum)
Beta Blockers   Reason not to administer Beta Blockers:Not Applicable, Patient takes labetalol daily. States he took his daily dose in Short Stay 3/21

## 2012-04-13 NOTE — Op Note (Signed)
Open Repair of Right Incarcerated Inguinal Hernia with Mesh Procedure Note  Indications: The patient presented with a history of a right, not reducible hernia.    Pre-operative Diagnosis: right incarcerated inguinal hernia  Post-operative Diagnosis: incarcerated right indirect inguinal hernia  Surgeon: Gayland Curry   Assistants: none  Anesthesia: General endotracheal anesthesia  ASA Class: 3   Surgeon: Leighton Ruff. Redmond Pulling, MD, FACS  Procedure Details  The patient was seen again in the Holding Room. The risks, benefits, complications, treatment options, and expected outcomes were discussed with the patient. The possibilities of reaction to medication, pulmonary aspiration, perforation of viscus, bleeding, recurrent infection, the need for additional procedures, and development of a complication requiring transfusion or further operation were discussed with the patient and/or family. The likelihood of success in repairing the hernia and returning the patient to their previous functional status is good.  There was concurrence with the proposed plan, and informed consent was obtained. The site of surgery was properly noted/marked. The patient was taken to the Operating Room, identified as Nicholas [name], and the procedure verified as right inguinal hernia repair. A Time Out was held and the above information confirmed.  The patient was placed in the supine position and underwent induction of anesthesia. IV antibiotic was administered. The lower abdomen and groin was prepped with Chloraprep and the groin was prepped with Betadine and draped in the standard fashion, and 0.25% Marcaine with epinephrine was used to anesthetize the skin over the mid-portion of the inguinal canal. An 10 cm oblique incision was made. Dissection was carried down through the subcutaneous tissue with cautery to the external oblique fascia.  We opened the external oblique fascia along the direction of its fibers to the external  ring.  It took several minutes but I was able to deliver the large incarcerated hernia out of the scrotum. It appeared to be entirely omentum within the sac. The spermatic cord was circumferentially dissected bluntly and retracted with a Penrose drain.  The floor of the inguinal canal was inspected and the floor was weakened. Patient had a large indirect hernia.   I couldn't reduce the indirect hernia so I started to open the sac which was thickened. I was able to dissect and amputate some of the omentum with a Ligasure. After debulking some of the hernia, I was able to reduce it into the abdominal cavity. We skeletonized the spermatic cord.  We used a 3 x 6 inch piece of Ultrapro mesh, which was cut into a keyhole shape.  This was secured with 2-0 Prolene, beginning at the pubic tubercle, running this along the shelving edge inferiorly. Superiorly, the mesh was secured to the internal oblique fascia with interrupted 2-0 Prolene sutures.  The tails of the mesh were sutured together behind the spermatic cord.  The mesh was tucked underneath the external oblique fascia laterally.  The external oblique fascia was reapproximated with 2-0 Vicryl.  3-0 Vicryl was used to close the subcutaneous tissues and 4-0 Monocryl was used to close the skin in subcuticular fashion.  Benzoin and steri-strips were used to seal the incision.  A clean dressing was applied.  The patient was then extubated and brought to the recovery room in stable condition.  All sponge, instrument, and needle counts were correct prior to closure and at the conclusion of the case.   Estimated Blood Loss: Minimal                 Complications: None; patient tolerated the procedure well.  Disposition: PACU - hemodynamically stable.         Condition: stable  Leighton Ruff. Redmond Pulling, MD, FACS General, Bariatric, & Minimally Invasive Surgery [facility], [place]

## 2012-04-13 NOTE — Progress Notes (Signed)
No new orders in epic for consent. Pt. States surgery is on the right side. Notified Dr. Redmond Pulling. He stated will sign consent in holding.

## 2012-04-13 NOTE — Anesthesia Procedure Notes (Addendum)
Procedure Name: Intubation Date/Time: 04/13/2012 7:35 AM Performed by: Jenne Campus Pre-anesthesia Checklist: Patient identified, Emergency Drugs available, Suction available, Patient being monitored and Timeout performed Patient Re-evaluated:Patient Re-evaluated prior to inductionOxygen Delivery Method: Circle system utilized Preoxygenation: Pre-oxygenation with 100% oxygen Intubation Type: IV induction Ventilation: Mask ventilation without difficulty and Oral airway inserted - appropriate to patient size Laryngoscope Size: Mac and 4 Grade View: Grade II Tube size: 7.5 mm Number of attempts: 2 Airway Equipment and Method: Stylet and LTA kit utilized Placement Confirmation: ETT inserted through vocal cords under direct vision,  positive ETCO2,  CO2 detector and breath sounds checked- equal and bilateral Secured at: 22 cm Tube secured with: Tape Comments: Smooth IV induction. EZ mask with 35mm OA by EMT student. DL x 1 MAC 4. Unable to obtain view. Small amount of blood oozing from upper gum line. DL x 1 Dr. Tamala Julian Grade 2 view. Atraumatic OI. +ETCO2. Bbse.  4x4's and pressure applied to upper gum line.

## 2012-04-13 NOTE — Anesthesia Postprocedure Evaluation (Signed)
  Anesthesia Post-op Note  Patient: Nicholas [name]  Procedure(s) Performed: Procedure(s): HERNIA REPAIR INGUINAL INCARCERATED (Right) INSERTION OF MESH (Right)  Patient Location: PACU  Anesthesia Type:General  Level of Consciousness: awake, oriented and patient cooperative  Airway and Oxygen Therapy: Patient Spontanous Breathing  Post-op Pain: mild  Post-op Assessment: Post-op Vital signs reviewed, Patient's Cardiovascular Status Stable, Respiratory Function Stable, Patent Airway, No signs of Nausea or vomiting and Pain level controlled  Post-op Vital Signs: stable  Complications: No apparent anesthesia complications

## 2012-04-13 NOTE — Interval H&P Note (Signed)
History and Physical Interval Note:  04/13/2012 7:22 AM  Nicholas [name]  has presented today for surgery, with the diagnosis of incarcerated right inguinal hernia   The various methods of treatment have been discussed with the patient and family. After consideration of risks, benefits and other options for treatment, the patient has consented to  Procedure(s): HERNIA REPAIR INGUINAL INCARCERATED (Right) INSERTION OF MESH (Right) as a surgical intervention .  The patient's history has been reviewed, patient examined, no change in status, stable for surgery.  I have reviewed the patient's chart and labs.  Questions were answered to the patient's satisfaction.    Leighton Ruff. Redmond Pulling, MD, [place], Bariatric, & Minimally Invasive Surgery [facility], [place]  [facility] M

## 2012-04-13 NOTE — Anesthesia Preprocedure Evaluation (Addendum)
Anesthesia Evaluation  Patient identified by MRN, date of birth, ID band Patient awake    Reviewed: Allergy & Precautions, H&P , NPO status , Patient's Chart, lab work & pertinent test results, reviewed documented beta blocker date and time   History of Anesthesia Complications Negative for: history of anesthetic complications  Airway Mallampati: I TM Distance: >3 FB Neck ROM: full    Dental  (+) Edentulous Upper, Edentulous Lower and Dental Advisory Given   Pulmonary former smoker,          Cardiovascular hypertension, Pt. on medications and Pt. on home beta blockers Rhythm:regular Rate:Normal     Neuro/Psych    GI/Hepatic GERD-  ,  Endo/Other  diabetes, Type 2, Insulin Dependent  Renal/GU CRFRenal disease     Musculoskeletal   Abdominal   Peds  Hematology   Anesthesia Other Findings   Reproductive/Obstetrics                          Anesthesia Physical Anesthesia Plan  ASA: III  Anesthesia Plan: General   Post-op Pain Management:    Induction: Intravenous  Airway Management Planned: Oral ETT  Additional Equipment:   Intra-op Plan:   Post-operative Plan: Extubation in OR  Informed Consent: I have reviewed the patients History and Physical, chart, labs and discussed the procedure including the risks, benefits and alternatives for the proposed anesthesia with the patient or authorized representative who has indicated his/her understanding and acceptance.     Plan Discussed with: CRNA, Anesthesiologist and Surgeon  Anesthesia Plan Comments:         Anesthesia Quick Evaluation

## 2012-04-13 NOTE — H&P (View-Only) (Signed)
Patient ID: Nicholas [name], male   DOB: October 31, 1930, 79 y.o.   MRN: [id number]  No chief complaint on file.   HPI Nicholas [name] is a 79 y.o. male.   HPI 79 year old African American male referred by Dr. Carol Ada for evaluation of a large right scrotal hernia. The patient states that he was diagnosed with an inguinal hernia in 2001. He states that he was told by his [place] physician that he did not need it repaired since it is asymptomatic. He still denies any pain or tenderness in his right groin area. However he states that it is gotten larger over time. He denies any nausea, vomiting, diarrhea or constipation. He denies any numbness or tingling in his right groin. He has had a prior vasectomy. He denies any weight loss. He does complain of urinary issues such as weak stream and urinating in the middle the night.   Past Medical History  Diagnosis Date  . Diabetes mellitus   . Hypertension   . Hypercholesteremia   . Psoriasis   . Arthritis     History reviewed. No pertinent past surgical history.  Family History  Problem Relation Age of Onset  . Stroke Father   . Diabetes Mother   . Diabetes Sister   . Diabetes Brother     Social History History  Substance Use Topics  . Smoking status: Former Smoker    Quit date: 03/29/1987  . Smokeless tobacco: Not on file  . Alcohol Use: No    No Known Allergies  Current Outpatient Prescriptions  Medication Sig Dispense Refill  . amLODipine (NORVASC) 10 MG tablet Take 10 mg by mouth daily.      . ASCORBIC ACID PO Take by mouth.      Marland Kitchen aspirin 325 MG tablet Take 325 mg by mouth daily.      . COD LIVER OIL PO Take by mouth.      . diltiazem (DILACOR XR) 180 MG 24 hr capsule Take 180 mg by mouth daily.      Marland Kitchen ezetimibe-simvastatin (VYTORIN) 10-40 MG per tablet Take 1 tablet by mouth at bedtime.      . ferrous sulfate 325 (65 FE) MG tablet Take 325 mg by mouth daily with breakfast.      . insulin NPH-insulin regular (NOVOLIN 70/30) (70-30)  100 UNIT/ML injection Inject into the skin.      Marland Kitchen labetalol (NORMODYNE) 200 MG tablet Take 200 mg by mouth 2 (two) times daily.      Marland Kitchen lisinopril (PRINIVIL,ZESTRIL) 40 MG tablet Take by mouth daily.      . mupirocin nasal ointment (BACTROBAN) 2 % Place into the nose 2 (two) times daily. Use one-half of tube in each nostril twice daily for five (5) days. After application, press sides of nose together and gently massage.      . niacin 500 MG tablet Take 500 mg by mouth daily with breakfast.      . triamcinolone cream (KENALOG) 0.1 % Apply topically 2 (two) times daily.      . vardenafil (LEVITRA) 10 MG tablet Take 10 mg by mouth daily as needed for erectile dysfunction.      . tamsulosin (FLOMAX) 0.4 MG CAPS Take 1 capsule (0.4 mg total) by mouth daily. Start taking 2 days before surgery  10 capsule  0   No current facility-administered medications for this visit.    Review of Systems Review of Systems  Constitutional: Negative for fever, chills, appetite change and unexpected weight change.  HENT: Negative for congestion and trouble swallowing.        +glasses  Eyes: Negative for visual disturbance.  Respiratory: Negative for chest tightness and shortness of breath.   Cardiovascular: Negative for chest pain and leg swelling.       No PND, no orthopnea, no DOE  Gastrointestinal: Negative for nausea, abdominal pain, diarrhea and constipation.       See HPI  Genitourinary: Negative for dysuria and hematuria.       +nocturia, +weak stream  Musculoskeletal: Negative.   Skin: Negative for rash.  Neurological: Negative for seizures and speech difficulty.       No tias, no amaurosis fugax  Hematological: Does not bruise/bleed easily.  Psychiatric/Behavioral: Negative for behavioral problems and confusion.    Blood pressure 115/72, pulse 70, temperature 98 F (36.7 C), temperature source Temporal, resp. rate 16, weight 180 lb 9.6 oz (81.92 kg).  Physical Exam Physical Exam  Vitals  reviewed. Constitutional: He is oriented to person, place, and time. He appears well-developed and well-nourished. No distress.  HENT:  Head: Normocephalic and atraumatic.  Right Ear: External ear normal.  Left Ear: External ear normal.  Eyes: Conjunctivae are normal. No scleral icterus.  Neck: Neck supple. No tracheal deviation present. No thyromegaly present.  Cardiovascular: Normal rate, regular rhythm and normal heart sounds.   Pulmonary/Chest: Effort normal and breath sounds normal. No respiratory distress. He has no wheezes.  Abdominal: Soft. He exhibits no distension. There is no tenderness. There is no rebound and no guarding. A hernia is present. Hernia confirmed positive in the right inguinal area. Hernia confirmed negative in the left inguinal area.    Genitourinary: Penis normal. Left testis shows no mass.  Large right inguinal/scrotal hernia. Not able to reduce. Soft, nontender. Difficult to palpate Rt testicle  Lymphadenopathy:    He has no cervical adenopathy.       Right: No inguinal adenopathy present.       Left: No inguinal adenopathy present.  Neurological: He is alert and oriented to person, place, and time.  Skin: Skin is warm and dry. Rash noted. He is not diaphoretic. No erythema.     psoriasis dorsum L hand  Psychiatric: He has a normal mood and affect. His behavior is normal. Judgment and thought content normal.    Data Reviewed Dr Thompson Caul note  Assessment    Right incarcerated inguinal hernia     Plan    We discussed the etiology of inguinal hernias. We discussed the signs & symptoms of incarceration & strangulation.  We discussed non-operative and operative management.  The patient has elected to proceed with OPEN REPAIR OF RIGHT INCARCERATED INGUINAL HERNIA WITH MESH   I described the procedure in detail.  The patient was given educational material. We discussed the risks and benefits including but not limited to bleeding, infection, chronic  inguinal pain, nerve entrapment, hernia recurrence, mesh complications, hematoma formation, urinary retention, injury to the testicles or the ovaries, numbness in the groin, blood clots, injury to the surrounding structures, and anesthesia risk. We also discussed the typical post operative recovery course, including no heavy lifting for 6 weeks. I explained that the likelihood of improvement of their symptoms is good. I explained that he is at higher risk for recurrence because of the large nature of his inguinal hernia as well as because of his underlying diabetes mellitus. I also explained that he is at higher risk for chronic nerve pain as well as hematoma formation as well as  postoperative urinary retention. We will place him on perioperative Flomax. He was advised not to take Levitra at the same time as taking Flomax.  Leighton Ruff. Redmond Pulling, MD, FACS General, Bariatric, & Minimally Invasive Surgery [facility], [place]          [facility] M 03/29/2012, 10:23 AM

## 2012-04-16 ENCOUNTER — Encounter (HOSPITAL_COMMUNITY): Payer: Self-pay | Admitting: General Surgery

## 2012-04-30 ENCOUNTER — Ambulatory Visit (INDEPENDENT_AMBULATORY_CARE_PROVIDER_SITE_OTHER): Payer: Medicare Other | Admitting: General Surgery

## 2012-04-30 ENCOUNTER — Encounter (INDEPENDENT_AMBULATORY_CARE_PROVIDER_SITE_OTHER): Payer: Self-pay | Admitting: General Surgery

## 2012-04-30 VITALS — BP 134/74 | HR 71 | Temp 98.0°F | Resp 18 | Ht 68.0 in | Wt 177.0 lb

## 2012-04-30 DIAGNOSIS — R911 Solitary pulmonary nodule: Secondary | ICD-10-CM | POA: Insufficient documentation

## 2012-04-30 DIAGNOSIS — Z09 Encounter for follow-up examination after completed treatment for conditions other than malignant neoplasm: Secondary | ICD-10-CM

## 2012-04-30 NOTE — Progress Notes (Signed)
Subjective:     Patient ID: Nicholas [name], male   DOB: February 04, 1930, 79 y.o.   MRN: [id number]  HPI 79 year old [place] American male comes in today for his first postoperative appointment. He underwent open repair of a large right incarcerated indirect inguinal hernia on March 21. He denies any significant postoperative pain or discomfort. He is no longer taking any pain medications. He denies any fever, chills, nausea, vomiting, diarrhea or constipation. He denies any difficulty urinating. He denies any inguinal numbness, tingling, or burning.  Review of Systems     Objective:   Physical Exam BP 134/74  Pulse 71  Temp(Src) 98 F (36.7 C)  Resp 18  Ht 5\' 8"  (1.727 m)  Wt 177 lb (80.287 kg)  BMI 26.92 kg/m2 Alert, no apparent distress Abdomen soft, nontender nondistended GU-well-healed right inguinal incision. No cellulitis, fluctuants, or induration. No significant swelling underneath the incision. Both testicles are down. He does have a small lower inguinal  scrotal hematoma    Assessment:     Status post open repair of large right incarcerated indirect inguinal hernia with mesh     Plan:     Overall I am quite pleased with how he is doing. He was at high risk for having groin pain. I explained that the small scrotal hematoma will resolve however it may take an additional 2 months for it to completely resolve. He was reminded he should not do any heavy lifting for another 4 weeks. However I told him he could do light activity such as walking on a treadmill or doing his exercise class which is mainly cardio. Followup 8 weeks  Leighton Ruff. Redmond Pulling, MD, FACS General, Bariatric, & Minimally Invasive Surgery [facility], [place]

## 2012-04-30 NOTE — Patient Instructions (Signed)
Avoid lifting, pushing, or pulling anything greater than 15 pounds for another 4 weeks Can resume your cardio class Can mow your grass Can resume bowling in 2 1/2 weeks

## 2012-05-01 ENCOUNTER — Telehealth (INDEPENDENT_AMBULATORY_CARE_PROVIDER_SITE_OTHER): Payer: Self-pay | Admitting: General Surgery

## 2012-05-01 NOTE — Telephone Encounter (Signed)
Spoke with patient and made him aware of Chest Xray results. A copy mailed to patient to follow up with his PCP at [facility] clinic. He will call back with any questions.

## 2012-05-01 NOTE — Telephone Encounter (Signed)
Message copied by Margarette Asal on Tue May 01, 2012  9:32 AM ------      Message from: Redmond Pulling, ERIC M      Created: Mon Apr 30, 2012  4:50 PM       Please call the patient and let him preoperative chest x-ray showed a small less than 1 cm nodule in his lung. It is not uncommon to see lung nodules in people who have smoked. However he needs to followup with his primary care physician to determine whether or not he needs a chest CT to further evaluate this nodule            Thanks      wilson ------

## 2012-05-01 NOTE — Telephone Encounter (Signed)
Left message on machine for patient to call back and ask for me. To make him aware pre-op Chest Xray did show a lung nodule and he should follow up with his PCP to see if he needs additional imaging. Awaiting call back.

## 2012-05-03 ENCOUNTER — Encounter (INDEPENDENT_AMBULATORY_CARE_PROVIDER_SITE_OTHER): Payer: Medicare Other | Admitting: General Surgery

## 2012-05-07 DIAGNOSIS — L851 Acquired keratosis [keratoderma] palmaris et plantaris: Secondary | ICD-10-CM | POA: Diagnosis not present

## 2012-05-07 DIAGNOSIS — B351 Tinea unguium: Secondary | ICD-10-CM | POA: Diagnosis not present

## 2012-05-07 DIAGNOSIS — E1159 Type 2 diabetes mellitus with other circulatory complications: Secondary | ICD-10-CM | POA: Diagnosis not present

## 2012-06-08 ENCOUNTER — Other Ambulatory Visit (INDEPENDENT_AMBULATORY_CARE_PROVIDER_SITE_OTHER): Payer: Self-pay | Admitting: General Surgery

## 2012-06-27 ENCOUNTER — Ambulatory Visit (INDEPENDENT_AMBULATORY_CARE_PROVIDER_SITE_OTHER): Payer: Medicare Other | Admitting: General Surgery

## 2012-06-27 ENCOUNTER — Encounter (INDEPENDENT_AMBULATORY_CARE_PROVIDER_SITE_OTHER): Payer: Self-pay | Admitting: General Surgery

## 2012-06-27 VITALS — BP 146/76 | HR 80 | Temp 98.8°F | Resp 20 | Ht 68.0 in | Wt 184.4 lb

## 2012-06-27 DIAGNOSIS — Z09 Encounter for follow-up examination after completed treatment for conditions other than malignant neoplasm: Secondary | ICD-10-CM

## 2012-06-27 NOTE — Progress Notes (Signed)
Subjective:     Patient ID: Nicholas [name], male   DOB: [birth year], 79 y.o.   MRN: [id number]  HPI 79 year old African American male comes in for long-term followup after undergoing open repair of incarcerated right indirect inguinal hernia with mesh on 04/13/2012. When I last saw him about 77 weeks ago he had a small scrotal hematoma. He denies any pain in his groin. He denies any numbness or tingling. He denies any burning or stinging. He denies any nausea or vomiting. He denies any diarrhea or constipation. He has started bowling again. He did discuss the lung nodule that was seen on his preoperative chest x-ray with his primary care physician.  Review of Systems     Objective:   Physical Exam BP 146/76  Pulse 80  Temp(Src) 98.8 F (37.1 C) (Temporal)  Resp 20  Ht 5\' 8"  (1.727 m)  Wt 184 lb 6.4 oz (83.643 kg)  BMI 28.04 kg/m2 Alert, no apparent distress GU-well-healed right inguinal incision. No signs of swelling or inguinal hematoma no signs of hernia recurrence. Both testicles descended    Assessment:     Status post open repair of right incarcerated indirect inguinal hernia with mesh     Plan:     Overall he is doing well. He was cleared to return to full activities. However I did encourage him to avoid very heavy lifting as much as possible. followup as needed  Nicholas Ruff. Redmond Pulling, MD, FACS General, Bariatric, & Minimally Invasive Surgery [facility], [place]

## 2012-06-27 NOTE — Patient Instructions (Signed)
You can resume heavy lifting.  You need to follow up with your primary care doctor regarding the lung nodule seen on your chest xray taken before hernia surgery

## 2012-07-16 DIAGNOSIS — L851 Acquired keratosis [keratoderma] palmaris et plantaris: Secondary | ICD-10-CM | POA: Diagnosis not present

## 2012-07-16 DIAGNOSIS — B351 Tinea unguium: Secondary | ICD-10-CM | POA: Diagnosis not present

## 2012-07-16 DIAGNOSIS — E1159 Type 2 diabetes mellitus with other circulatory complications: Secondary | ICD-10-CM | POA: Diagnosis not present

## 2012-09-17 DIAGNOSIS — E1159 Type 2 diabetes mellitus with other circulatory complications: Secondary | ICD-10-CM | POA: Diagnosis not present

## 2012-09-17 DIAGNOSIS — L851 Acquired keratosis [keratoderma] palmaris et plantaris: Secondary | ICD-10-CM | POA: Diagnosis not present

## 2012-09-17 DIAGNOSIS — B351 Tinea unguium: Secondary | ICD-10-CM | POA: Diagnosis not present

## 2012-10-08 DIAGNOSIS — Z23 Encounter for immunization: Secondary | ICD-10-CM | POA: Diagnosis not present

## 2012-10-29 DIAGNOSIS — H6120 Impacted cerumen, unspecified ear: Secondary | ICD-10-CM | POA: Diagnosis not present

## 2012-10-29 DIAGNOSIS — L408 Other psoriasis: Secondary | ICD-10-CM | POA: Diagnosis not present

## 2012-11-27 DIAGNOSIS — B351 Tinea unguium: Secondary | ICD-10-CM | POA: Diagnosis not present

## 2012-11-27 DIAGNOSIS — L851 Acquired keratosis [keratoderma] palmaris et plantaris: Secondary | ICD-10-CM | POA: Diagnosis not present

## 2012-11-27 DIAGNOSIS — E1159 Type 2 diabetes mellitus with other circulatory complications: Secondary | ICD-10-CM | POA: Diagnosis not present

## 2013-01-12 DIAGNOSIS — R7301 Impaired fasting glucose: Secondary | ICD-10-CM | POA: Diagnosis not present

## 2013-01-12 DIAGNOSIS — E161 Other hypoglycemia: Secondary | ICD-10-CM | POA: Diagnosis not present

## 2013-02-01 DIAGNOSIS — E1159 Type 2 diabetes mellitus with other circulatory complications: Secondary | ICD-10-CM | POA: Diagnosis not present

## 2013-02-01 DIAGNOSIS — B351 Tinea unguium: Secondary | ICD-10-CM | POA: Diagnosis not present

## 2013-02-01 DIAGNOSIS — L851 Acquired keratosis [keratoderma] palmaris et plantaris: Secondary | ICD-10-CM | POA: Diagnosis not present

## 2013-04-12 DIAGNOSIS — B351 Tinea unguium: Secondary | ICD-10-CM | POA: Diagnosis not present

## 2013-04-12 DIAGNOSIS — L851 Acquired keratosis [keratoderma] palmaris et plantaris: Secondary | ICD-10-CM | POA: Diagnosis not present

## 2013-04-12 DIAGNOSIS — E1159 Type 2 diabetes mellitus with other circulatory complications: Secondary | ICD-10-CM | POA: Diagnosis not present

## 2013-06-21 DIAGNOSIS — E1159 Type 2 diabetes mellitus with other circulatory complications: Secondary | ICD-10-CM | POA: Diagnosis not present

## 2013-06-21 DIAGNOSIS — L851 Acquired keratosis [keratoderma] palmaris et plantaris: Secondary | ICD-10-CM | POA: Diagnosis not present

## 2013-06-21 DIAGNOSIS — B351 Tinea unguium: Secondary | ICD-10-CM | POA: Diagnosis not present

## 2013-07-11 DIAGNOSIS — L408 Other psoriasis: Secondary | ICD-10-CM | POA: Diagnosis not present

## 2013-08-30 DIAGNOSIS — L851 Acquired keratosis [keratoderma] palmaris et plantaris: Secondary | ICD-10-CM | POA: Diagnosis not present

## 2013-08-30 DIAGNOSIS — E1159 Type 2 diabetes mellitus with other circulatory complications: Secondary | ICD-10-CM | POA: Diagnosis not present

## 2013-08-30 DIAGNOSIS — B351 Tinea unguium: Secondary | ICD-10-CM | POA: Diagnosis not present

## 2013-10-07 DIAGNOSIS — Z23 Encounter for immunization: Secondary | ICD-10-CM | POA: Diagnosis not present

## 2013-11-08 DIAGNOSIS — B351 Tinea unguium: Secondary | ICD-10-CM | POA: Diagnosis not present

## 2013-11-08 DIAGNOSIS — L84 Corns and callosities: Secondary | ICD-10-CM | POA: Diagnosis not present

## 2013-11-08 DIAGNOSIS — E1159 Type 2 diabetes mellitus with other circulatory complications: Secondary | ICD-10-CM | POA: Diagnosis not present

## 2013-11-11 DIAGNOSIS — R140 Abdominal distension (gaseous): Secondary | ICD-10-CM | POA: Diagnosis not present

## 2013-11-11 DIAGNOSIS — R110 Nausea: Secondary | ICD-10-CM | POA: Diagnosis not present

## 2013-11-28 DIAGNOSIS — R51 Headache: Secondary | ICD-10-CM | POA: Diagnosis not present

## 2013-11-28 DIAGNOSIS — H6122 Impacted cerumen, left ear: Secondary | ICD-10-CM | POA: Diagnosis not present

## 2014-01-13 DIAGNOSIS — B351 Tinea unguium: Secondary | ICD-10-CM | POA: Diagnosis not present

## 2014-01-13 DIAGNOSIS — E1159 Type 2 diabetes mellitus with other circulatory complications: Secondary | ICD-10-CM | POA: Diagnosis not present

## 2014-01-13 DIAGNOSIS — L84 Corns and callosities: Secondary | ICD-10-CM | POA: Diagnosis not present

## 2014-03-24 DIAGNOSIS — L84 Corns and callosities: Secondary | ICD-10-CM | POA: Diagnosis not present

## 2014-03-24 DIAGNOSIS — B351 Tinea unguium: Secondary | ICD-10-CM | POA: Diagnosis not present

## 2014-03-24 DIAGNOSIS — E1159 Type 2 diabetes mellitus with other circulatory complications: Secondary | ICD-10-CM | POA: Diagnosis not present

## 2014-06-02 DIAGNOSIS — L84 Corns and callosities: Secondary | ICD-10-CM | POA: Diagnosis not present

## 2014-06-02 DIAGNOSIS — B351 Tinea unguium: Secondary | ICD-10-CM | POA: Diagnosis not present

## 2014-06-02 DIAGNOSIS — E1159 Type 2 diabetes mellitus with other circulatory complications: Secondary | ICD-10-CM | POA: Diagnosis not present

## 2014-08-11 DIAGNOSIS — L84 Corns and callosities: Secondary | ICD-10-CM | POA: Diagnosis not present

## 2014-08-11 DIAGNOSIS — B351 Tinea unguium: Secondary | ICD-10-CM | POA: Diagnosis not present

## 2014-08-11 DIAGNOSIS — E1159 Type 2 diabetes mellitus with other circulatory complications: Secondary | ICD-10-CM | POA: Diagnosis not present

## 2014-10-03 ENCOUNTER — Encounter (HOSPITAL_BASED_OUTPATIENT_CLINIC_OR_DEPARTMENT_OTHER): Payer: Self-pay | Admitting: *Deleted

## 2014-10-03 ENCOUNTER — Emergency Department (HOSPITAL_BASED_OUTPATIENT_CLINIC_OR_DEPARTMENT_OTHER)
Admission: EM | Admit: 2014-10-03 | Discharge: 2014-10-04 | Disposition: A | Discharge status: Home / Self Care | Payer: Medicare Other | Attending: Emergency Medicine | Admitting: Emergency Medicine

## 2014-10-03 DIAGNOSIS — T2014XA Burn of first degree of nose (septum), initial encounter: Secondary | ICD-10-CM | POA: Diagnosis not present

## 2014-10-03 DIAGNOSIS — Y92009 Unspecified place in unspecified non-institutional (private) residence as the place of occurrence of the external cause: Secondary | ICD-10-CM | POA: Insufficient documentation

## 2014-10-03 DIAGNOSIS — X12XXXA Contact with other hot fluids, initial encounter: Secondary | ICD-10-CM | POA: Diagnosis not present

## 2014-10-03 DIAGNOSIS — Z872 Personal history of diseases of the skin and subcutaneous tissue: Secondary | ICD-10-CM | POA: Insufficient documentation

## 2014-10-03 DIAGNOSIS — E780 Pure hypercholesterolemia: Secondary | ICD-10-CM | POA: Diagnosis not present

## 2014-10-03 DIAGNOSIS — Z23 Encounter for immunization: Secondary | ICD-10-CM | POA: Insufficient documentation

## 2014-10-03 DIAGNOSIS — Y9389 Activity, other specified: Secondary | ICD-10-CM | POA: Diagnosis not present

## 2014-10-03 DIAGNOSIS — R011 Cardiac murmur, unspecified: Secondary | ICD-10-CM | POA: Diagnosis not present

## 2014-10-03 DIAGNOSIS — Z7982 Long term (current) use of aspirin: Secondary | ICD-10-CM | POA: Diagnosis not present

## 2014-10-03 DIAGNOSIS — M1990 Unspecified osteoarthritis, unspecified site: Secondary | ICD-10-CM | POA: Insufficient documentation

## 2014-10-03 DIAGNOSIS — E119 Type 2 diabetes mellitus without complications: Secondary | ICD-10-CM | POA: Diagnosis not present

## 2014-10-03 DIAGNOSIS — N189 Chronic kidney disease, unspecified: Secondary | ICD-10-CM | POA: Insufficient documentation

## 2014-10-03 DIAGNOSIS — Z794 Long term (current) use of insulin: Secondary | ICD-10-CM | POA: Diagnosis not present

## 2014-10-03 DIAGNOSIS — T2024XA Burn of second degree of nose (septum), initial encounter: Secondary | ICD-10-CM | POA: Diagnosis not present

## 2014-10-03 DIAGNOSIS — Z79899 Other long term (current) drug therapy: Secondary | ICD-10-CM | POA: Diagnosis not present

## 2014-10-03 DIAGNOSIS — S0081XA Abrasion of other part of head, initial encounter: Secondary | ICD-10-CM | POA: Insufficient documentation

## 2014-10-03 DIAGNOSIS — Y998 Other external cause status: Secondary | ICD-10-CM | POA: Insufficient documentation

## 2014-10-03 DIAGNOSIS — I129 Hypertensive chronic kidney disease with stage 1 through stage 4 chronic kidney disease, or unspecified chronic kidney disease: Secondary | ICD-10-CM | POA: Insufficient documentation

## 2014-10-03 DIAGNOSIS — T2004XA Burn of unspecified degree of nose (septum), initial encounter: Secondary | ICD-10-CM | POA: Diagnosis present

## 2014-10-03 DIAGNOSIS — Z8719 Personal history of other diseases of the digestive system: Secondary | ICD-10-CM | POA: Diagnosis not present

## 2014-10-03 DIAGNOSIS — T2026XA Burn of second degree of forehead and cheek, initial encounter: Secondary | ICD-10-CM | POA: Diagnosis not present

## 2014-10-03 DIAGNOSIS — Z87891 Personal history of nicotine dependence: Secondary | ICD-10-CM | POA: Insufficient documentation

## 2014-10-03 DIAGNOSIS — T2000XA Burn of unspecified degree of head, face, and neck, unspecified site, initial encounter: Secondary | ICD-10-CM

## 2014-10-03 DIAGNOSIS — T310 Burns involving less than 10% of body surface: Secondary | ICD-10-CM | POA: Diagnosis not present

## 2014-10-03 MED ORDER — TETANUS-DIPHTH-ACELL PERTUSSIS 5-2.5-18.5 LF-MCG/0.5 IM SUSP
0.5000 mL | Freq: Once | INTRAMUSCULAR | Status: AC
Start: 2014-10-03 — End: 2014-10-03
  Administered 2014-10-03: 0.5 mL via INTRAMUSCULAR
  Filled 2014-10-03: qty 0.5
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
## 2014-10-04 ENCOUNTER — Encounter (HOSPITAL_BASED_OUTPATIENT_CLINIC_OR_DEPARTMENT_OTHER): Payer: Self-pay | Admitting: Emergency Medicine

## 2014-10-04 DIAGNOSIS — T2024XA Burn of second degree of nose (septum), initial encounter: Secondary | ICD-10-CM | POA: Diagnosis not present

## 2014-10-04 LAB — CBG MONITORING, ED: GLUCOSE-CAPILLARY: 138 mg/dL — AB (ref 65–99)

## 2014-10-04 NOTE — Discharge Instructions (Signed)
Burn Care Burns hurt your skin. When your skin is hurt, it is easier to get an infection. Follow your doctor's directions to help prevent an infection. HOME CARE  Wash your hands well before you change your bandage.  Change your bandage as often as told by your doctor.  Remove the old bandage. If the bandage sticks, soak it off with cool, clean water.  Gently clean the burn with mild soap and water.  Pat the burn dry with a clean, dry cloth.  Put a thin layer of medicated cream on the burn.  Put a clean bandage on as told by your doctor.  Keep the bandage clean and dry.  Raise (elevate) the burn for the first 24 hours. After that, follow your doctor's directions.  Only take medicine as told by your doctor. GET HELP RIGHT AWAY IF:   You have too much pain.  The skin near the burn is red, tender, puffy (swollen), or has red streaks.  The burn area has yellowish white fluid (pus) or a bad smell coming from it.  You have a fever. MAKE SURE YOU:   Understand these instructions.  Will watch your condition.  Will get help right away if you are not doing well or get worse. Document Released: 10/20/2007 Document Revised: 04/04/2011 Document Reviewed: 06/02/2010 [facility] Patient Information 2015 [place], [place]. This information is not intended to replace advice given to you by your health care provider. Make sure you discuss any questions you have with your health care provider.

## 2014-10-07 DIAGNOSIS — M25552 Pain in left hip: Secondary | ICD-10-CM | POA: Diagnosis not present

## 2014-10-07 DIAGNOSIS — T2020XD Burn of second degree of head, face, and neck, unspecified site, subsequent encounter: Secondary | ICD-10-CM | POA: Diagnosis not present

## 2014-10-07 DIAGNOSIS — S93402A Sprain of unspecified ligament of left ankle, initial encounter: Secondary | ICD-10-CM | POA: Diagnosis not present

## 2014-10-27 DIAGNOSIS — Z23 Encounter for immunization: Secondary | ICD-10-CM | POA: Diagnosis not present

## 2014-10-27 DIAGNOSIS — K625 Hemorrhage of anus and rectum: Secondary | ICD-10-CM | POA: Diagnosis not present

## 2014-10-29 DIAGNOSIS — B351 Tinea unguium: Secondary | ICD-10-CM | POA: Diagnosis not present

## 2014-10-29 DIAGNOSIS — E1159 Type 2 diabetes mellitus with other circulatory complications: Secondary | ICD-10-CM | POA: Diagnosis not present

## 2014-10-29 DIAGNOSIS — L84 Corns and callosities: Secondary | ICD-10-CM | POA: Diagnosis not present

## 2014-11-13 DIAGNOSIS — D509 Iron deficiency anemia, unspecified: Secondary | ICD-10-CM | POA: Diagnosis not present

## 2014-11-21 DIAGNOSIS — K625 Hemorrhage of anus and rectum: Secondary | ICD-10-CM | POA: Diagnosis not present

## 2014-12-04 DIAGNOSIS — K625 Hemorrhage of anus and rectum: Secondary | ICD-10-CM | POA: Diagnosis not present

## 2014-12-04 DIAGNOSIS — D509 Iron deficiency anemia, unspecified: Secondary | ICD-10-CM | POA: Diagnosis not present

## 2014-12-30 DIAGNOSIS — K648 Other hemorrhoids: Secondary | ICD-10-CM | POA: Diagnosis not present

## 2014-12-30 DIAGNOSIS — K5730 Diverticulosis of large intestine without perforation or abscess without bleeding: Secondary | ICD-10-CM | POA: Diagnosis not present

## 2014-12-30 DIAGNOSIS — K921 Melena: Secondary | ICD-10-CM | POA: Diagnosis not present

## 2014-12-30 DIAGNOSIS — K3189 Other diseases of stomach and duodenum: Secondary | ICD-10-CM | POA: Diagnosis not present

## 2014-12-30 DIAGNOSIS — D509 Iron deficiency anemia, unspecified: Secondary | ICD-10-CM | POA: Diagnosis not present

## 2014-12-30 DIAGNOSIS — K625 Hemorrhage of anus and rectum: Secondary | ICD-10-CM | POA: Diagnosis not present

## 2015-01-14 DIAGNOSIS — E1159 Type 2 diabetes mellitus with other circulatory complications: Secondary | ICD-10-CM | POA: Diagnosis not present

## 2015-01-14 DIAGNOSIS — B351 Tinea unguium: Secondary | ICD-10-CM | POA: Diagnosis not present

## 2015-01-14 DIAGNOSIS — L84 Corns and callosities: Secondary | ICD-10-CM | POA: Diagnosis not present

## 2015-02-10 DIAGNOSIS — D509 Iron deficiency anemia, unspecified: Secondary | ICD-10-CM | POA: Diagnosis not present

## 2015-02-26 DIAGNOSIS — H6122 Impacted cerumen, left ear: Secondary | ICD-10-CM | POA: Diagnosis not present

## 2015-03-25 DIAGNOSIS — E1159 Type 2 diabetes mellitus with other circulatory complications: Secondary | ICD-10-CM | POA: Diagnosis not present

## 2015-03-25 DIAGNOSIS — B351 Tinea unguium: Secondary | ICD-10-CM | POA: Diagnosis not present

## 2015-03-25 DIAGNOSIS — L84 Corns and callosities: Secondary | ICD-10-CM | POA: Diagnosis not present

## 2015-05-04 DIAGNOSIS — M5441 Lumbago with sciatica, right side: Secondary | ICD-10-CM | POA: Diagnosis not present

## 2015-05-04 DIAGNOSIS — M1612 Unilateral primary osteoarthritis, left hip: Secondary | ICD-10-CM | POA: Diagnosis not present

## 2015-05-04 DIAGNOSIS — M545 Low back pain: Secondary | ICD-10-CM | POA: Diagnosis not present

## 2015-05-04 DIAGNOSIS — M4316 Spondylolisthesis, lumbar region: Secondary | ICD-10-CM | POA: Diagnosis not present

## 2015-05-14 DIAGNOSIS — M25551 Pain in right hip: Secondary | ICD-10-CM | POA: Diagnosis not present

## 2015-06-03 DIAGNOSIS — E1159 Type 2 diabetes mellitus with other circulatory complications: Secondary | ICD-10-CM | POA: Diagnosis not present

## 2015-06-03 DIAGNOSIS — L84 Corns and callosities: Secondary | ICD-10-CM | POA: Diagnosis not present

## 2015-06-03 DIAGNOSIS — B351 Tinea unguium: Secondary | ICD-10-CM | POA: Diagnosis not present

## 2015-06-19 DIAGNOSIS — M5442 Lumbago with sciatica, left side: Secondary | ICD-10-CM | POA: Diagnosis not present

## 2015-06-19 DIAGNOSIS — M5441 Lumbago with sciatica, right side: Secondary | ICD-10-CM | POA: Diagnosis not present

## 2015-06-19 DIAGNOSIS — M545 Low back pain: Secondary | ICD-10-CM | POA: Diagnosis not present

## 2015-07-07 DIAGNOSIS — M545 Low back pain: Secondary | ICD-10-CM | POA: Diagnosis not present

## 2015-07-07 DIAGNOSIS — M25551 Pain in right hip: Secondary | ICD-10-CM | POA: Diagnosis not present

## 2015-07-07 DIAGNOSIS — R29898 Other symptoms and signs involving the musculoskeletal system: Secondary | ICD-10-CM | POA: Diagnosis not present

## 2015-07-07 DIAGNOSIS — R269 Unspecified abnormalities of gait and mobility: Secondary | ICD-10-CM | POA: Diagnosis not present

## 2015-07-07 DIAGNOSIS — M79604 Pain in right leg: Secondary | ICD-10-CM | POA: Diagnosis not present

## 2015-07-10 DIAGNOSIS — M25551 Pain in right hip: Secondary | ICD-10-CM | POA: Diagnosis not present

## 2015-07-14 DIAGNOSIS — M25551 Pain in right hip: Secondary | ICD-10-CM | POA: Diagnosis not present

## 2015-07-16 DIAGNOSIS — M25551 Pain in right hip: Secondary | ICD-10-CM | POA: Diagnosis not present

## 2015-07-21 DIAGNOSIS — M25551 Pain in right hip: Secondary | ICD-10-CM | POA: Diagnosis not present

## 2015-07-23 DIAGNOSIS — M25552 Pain in left hip: Secondary | ICD-10-CM | POA: Diagnosis not present

## 2015-07-23 DIAGNOSIS — M160 Bilateral primary osteoarthritis of hip: Secondary | ICD-10-CM | POA: Diagnosis not present

## 2015-07-23 DIAGNOSIS — M545 Low back pain: Secondary | ICD-10-CM | POA: Diagnosis not present

## 2015-07-23 DIAGNOSIS — M25551 Pain in right hip: Secondary | ICD-10-CM | POA: Diagnosis not present

## 2015-07-27 DIAGNOSIS — M25551 Pain in right hip: Secondary | ICD-10-CM | POA: Diagnosis not present

## 2015-07-30 DIAGNOSIS — M25551 Pain in right hip: Secondary | ICD-10-CM | POA: Diagnosis not present

## 2015-08-04 DIAGNOSIS — M25551 Pain in right hip: Secondary | ICD-10-CM | POA: Diagnosis not present

## 2015-08-06 DIAGNOSIS — M545 Low back pain: Secondary | ICD-10-CM | POA: Diagnosis not present

## 2015-08-06 DIAGNOSIS — M79604 Pain in right leg: Secondary | ICD-10-CM | POA: Diagnosis not present

## 2015-08-06 DIAGNOSIS — R269 Unspecified abnormalities of gait and mobility: Secondary | ICD-10-CM | POA: Diagnosis not present

## 2015-08-06 DIAGNOSIS — M25551 Pain in right hip: Secondary | ICD-10-CM | POA: Diagnosis not present

## 2015-08-06 DIAGNOSIS — R29898 Other symptoms and signs involving the musculoskeletal system: Secondary | ICD-10-CM | POA: Diagnosis not present

## 2015-08-11 DIAGNOSIS — M25551 Pain in right hip: Secondary | ICD-10-CM | POA: Diagnosis not present

## 2015-08-20 DIAGNOSIS — R269 Unspecified abnormalities of gait and mobility: Secondary | ICD-10-CM | POA: Diagnosis not present

## 2015-08-20 DIAGNOSIS — M545 Low back pain: Secondary | ICD-10-CM | POA: Diagnosis not present

## 2015-08-20 DIAGNOSIS — M25551 Pain in right hip: Secondary | ICD-10-CM | POA: Diagnosis not present

## 2015-08-20 DIAGNOSIS — M79604 Pain in right leg: Secondary | ICD-10-CM | POA: Diagnosis not present

## 2015-08-20 DIAGNOSIS — R29898 Other symptoms and signs involving the musculoskeletal system: Secondary | ICD-10-CM | POA: Diagnosis not present

## 2015-08-24 DIAGNOSIS — B351 Tinea unguium: Secondary | ICD-10-CM | POA: Diagnosis not present

## 2015-08-24 DIAGNOSIS — E1159 Type 2 diabetes mellitus with other circulatory complications: Secondary | ICD-10-CM | POA: Diagnosis not present

## 2015-08-24 DIAGNOSIS — L84 Corns and callosities: Secondary | ICD-10-CM | POA: Diagnosis not present

## 2015-08-25 DIAGNOSIS — R269 Unspecified abnormalities of gait and mobility: Secondary | ICD-10-CM | POA: Diagnosis not present

## 2015-08-25 DIAGNOSIS — M79604 Pain in right leg: Secondary | ICD-10-CM | POA: Diagnosis not present

## 2015-08-25 DIAGNOSIS — R29898 Other symptoms and signs involving the musculoskeletal system: Secondary | ICD-10-CM | POA: Diagnosis not present

## 2015-08-25 DIAGNOSIS — M25551 Pain in right hip: Secondary | ICD-10-CM | POA: Diagnosis not present

## 2015-08-25 DIAGNOSIS — M545 Low back pain: Secondary | ICD-10-CM | POA: Diagnosis not present

## 2015-09-01 DIAGNOSIS — M25551 Pain in right hip: Secondary | ICD-10-CM | POA: Diagnosis not present

## 2015-09-08 DIAGNOSIS — R269 Unspecified abnormalities of gait and mobility: Secondary | ICD-10-CM | POA: Diagnosis not present

## 2015-09-08 DIAGNOSIS — M25551 Pain in right hip: Secondary | ICD-10-CM | POA: Diagnosis not present

## 2015-09-08 DIAGNOSIS — M79604 Pain in right leg: Secondary | ICD-10-CM | POA: Diagnosis not present

## 2015-09-08 DIAGNOSIS — M545 Low back pain: Secondary | ICD-10-CM | POA: Diagnosis not present

## 2015-09-08 DIAGNOSIS — R29898 Other symptoms and signs involving the musculoskeletal system: Secondary | ICD-10-CM | POA: Diagnosis not present

## 2015-09-17 DIAGNOSIS — M79604 Pain in right leg: Secondary | ICD-10-CM | POA: Diagnosis not present

## 2015-09-17 DIAGNOSIS — M545 Low back pain: Secondary | ICD-10-CM | POA: Diagnosis not present

## 2015-09-17 DIAGNOSIS — M25551 Pain in right hip: Secondary | ICD-10-CM | POA: Diagnosis not present

## 2015-09-17 DIAGNOSIS — R269 Unspecified abnormalities of gait and mobility: Secondary | ICD-10-CM | POA: Diagnosis not present

## 2015-09-17 DIAGNOSIS — R29898 Other symptoms and signs involving the musculoskeletal system: Secondary | ICD-10-CM | POA: Diagnosis not present

## 2015-09-22 DIAGNOSIS — M25551 Pain in right hip: Secondary | ICD-10-CM | POA: Diagnosis not present

## 2015-10-06 DIAGNOSIS — R29898 Other symptoms and signs involving the musculoskeletal system: Secondary | ICD-10-CM | POA: Diagnosis not present

## 2015-10-06 DIAGNOSIS — M25551 Pain in right hip: Secondary | ICD-10-CM | POA: Diagnosis not present

## 2015-10-06 DIAGNOSIS — M79604 Pain in right leg: Secondary | ICD-10-CM | POA: Diagnosis not present

## 2015-10-06 DIAGNOSIS — M545 Low back pain: Secondary | ICD-10-CM | POA: Diagnosis not present

## 2015-10-26 DIAGNOSIS — Z23 Encounter for immunization: Secondary | ICD-10-CM | POA: Diagnosis not present

## 2015-11-02 DIAGNOSIS — E1159 Type 2 diabetes mellitus with other circulatory complications: Secondary | ICD-10-CM | POA: Diagnosis not present

## 2015-11-02 DIAGNOSIS — L84 Corns and callosities: Secondary | ICD-10-CM | POA: Diagnosis not present

## 2015-11-02 DIAGNOSIS — B351 Tinea unguium: Secondary | ICD-10-CM | POA: Diagnosis not present

## 2016-01-13 DIAGNOSIS — B351 Tinea unguium: Secondary | ICD-10-CM | POA: Diagnosis not present

## 2016-01-13 DIAGNOSIS — E1159 Type 2 diabetes mellitus with other circulatory complications: Secondary | ICD-10-CM | POA: Diagnosis not present

## 2016-01-13 DIAGNOSIS — L84 Corns and callosities: Secondary | ICD-10-CM | POA: Diagnosis not present

## 2016-02-02 DIAGNOSIS — J209 Acute bronchitis, unspecified: Secondary | ICD-10-CM | POA: Diagnosis not present

## 2016-03-23 DIAGNOSIS — B351 Tinea unguium: Secondary | ICD-10-CM | POA: Diagnosis not present

## 2016-03-23 DIAGNOSIS — L84 Corns and callosities: Secondary | ICD-10-CM | POA: Diagnosis not present

## 2016-03-23 DIAGNOSIS — E1159 Type 2 diabetes mellitus with other circulatory complications: Secondary | ICD-10-CM | POA: Diagnosis not present

## 2016-06-01 DIAGNOSIS — L84 Corns and callosities: Secondary | ICD-10-CM | POA: Diagnosis not present

## 2016-06-01 DIAGNOSIS — E1159 Type 2 diabetes mellitus with other circulatory complications: Secondary | ICD-10-CM | POA: Diagnosis not present

## 2016-06-01 DIAGNOSIS — B351 Tinea unguium: Secondary | ICD-10-CM | POA: Diagnosis not present

## 2016-08-10 DIAGNOSIS — B351 Tinea unguium: Secondary | ICD-10-CM | POA: Diagnosis not present

## 2016-08-10 DIAGNOSIS — E1159 Type 2 diabetes mellitus with other circulatory complications: Secondary | ICD-10-CM | POA: Diagnosis not present

## 2016-08-10 DIAGNOSIS — L84 Corns and callosities: Secondary | ICD-10-CM | POA: Diagnosis not present

## 2016-10-19 DIAGNOSIS — B351 Tinea unguium: Secondary | ICD-10-CM | POA: Diagnosis not present

## 2016-10-19 DIAGNOSIS — E1159 Type 2 diabetes mellitus with other circulatory complications: Secondary | ICD-10-CM | POA: Diagnosis not present

## 2016-10-19 DIAGNOSIS — L84 Corns and callosities: Secondary | ICD-10-CM | POA: Diagnosis not present

## 2017-01-04 DIAGNOSIS — B351 Tinea unguium: Secondary | ICD-10-CM | POA: Diagnosis not present

## 2017-01-04 DIAGNOSIS — E1159 Type 2 diabetes mellitus with other circulatory complications: Secondary | ICD-10-CM | POA: Diagnosis not present

## 2017-01-04 DIAGNOSIS — L84 Corns and callosities: Secondary | ICD-10-CM | POA: Diagnosis not present

## 2017-02-21 DIAGNOSIS — D500 Iron deficiency anemia secondary to blood loss (chronic): Secondary | ICD-10-CM | POA: Diagnosis present

## 2017-02-21 DIAGNOSIS — E889 Metabolic disorder, unspecified: Secondary | ICD-10-CM | POA: Diagnosis not present

## 2017-02-21 DIAGNOSIS — E119 Type 2 diabetes mellitus without complications: Secondary | ICD-10-CM | POA: Diagnosis not present

## 2017-02-21 DIAGNOSIS — D631 Anemia in chronic kidney disease: Secondary | ICD-10-CM | POA: Diagnosis not present

## 2017-02-21 DIAGNOSIS — I2510 Atherosclerotic heart disease of native coronary artery without angina pectoris: Secondary | ICD-10-CM | POA: Diagnosis not present

## 2017-02-21 DIAGNOSIS — Z85528 Personal history of other malignant neoplasm of kidney: Secondary | ICD-10-CM | POA: Diagnosis not present

## 2017-02-21 DIAGNOSIS — D649 Anemia, unspecified: Secondary | ICD-10-CM | POA: Diagnosis not present

## 2017-02-21 DIAGNOSIS — E1122 Type 2 diabetes mellitus with diabetic chronic kidney disease: Secondary | ICD-10-CM | POA: Diagnosis not present

## 2017-02-21 DIAGNOSIS — N189 Chronic kidney disease, unspecified: Secondary | ICD-10-CM | POA: Diagnosis not present

## 2017-02-21 DIAGNOSIS — K219 Gastro-esophageal reflux disease without esophagitis: Secondary | ICD-10-CM | POA: Diagnosis present

## 2017-02-21 DIAGNOSIS — E1129 Type 2 diabetes mellitus with other diabetic kidney complication: Secondary | ICD-10-CM | POA: Diagnosis not present

## 2017-02-21 DIAGNOSIS — J449 Chronic obstructive pulmonary disease, unspecified: Secondary | ICD-10-CM | POA: Diagnosis present

## 2017-02-21 DIAGNOSIS — N186 End stage renal disease: Secondary | ICD-10-CM | POA: Diagnosis not present

## 2017-02-21 DIAGNOSIS — N2889 Other specified disorders of kidney and ureter: Secondary | ICD-10-CM | POA: Diagnosis not present

## 2017-02-21 DIAGNOSIS — R000 Tachycardia, unspecified: Secondary | ICD-10-CM | POA: Diagnosis not present

## 2017-02-21 DIAGNOSIS — R0789 Other chest pain: Secondary | ICD-10-CM | POA: Diagnosis not present

## 2017-02-21 DIAGNOSIS — R079 Chest pain, unspecified: Secondary | ICD-10-CM | POA: Diagnosis not present

## 2017-02-21 DIAGNOSIS — D62 Acute posthemorrhagic anemia: Secondary | ICD-10-CM | POA: Diagnosis not present

## 2017-02-21 DIAGNOSIS — R072 Precordial pain: Secondary | ICD-10-CM | POA: Diagnosis not present

## 2017-02-21 DIAGNOSIS — R9431 Abnormal electrocardiogram [ECG] [EKG]: Secondary | ICD-10-CM | POA: Diagnosis not present

## 2017-02-21 DIAGNOSIS — M9080 Osteopathy in diseases classified elsewhere, unspecified site: Secondary | ICD-10-CM | POA: Diagnosis not present

## 2017-02-21 DIAGNOSIS — R911 Solitary pulmonary nodule: Secondary | ICD-10-CM | POA: Diagnosis present

## 2017-02-21 DIAGNOSIS — K921 Melena: Secondary | ICD-10-CM | POA: Diagnosis not present

## 2017-02-21 DIAGNOSIS — I1311 Hypertensive heart and chronic kidney disease without heart failure, with stage 5 chronic kidney disease, or end stage renal disease: Secondary | ICD-10-CM | POA: Diagnosis not present

## 2017-02-21 DIAGNOSIS — R197 Diarrhea, unspecified: Secondary | ICD-10-CM | POA: Diagnosis not present

## 2017-02-21 DIAGNOSIS — I951 Orthostatic hypotension: Secondary | ICD-10-CM | POA: Diagnosis not present

## 2017-02-21 DIAGNOSIS — Z905 Acquired absence of kidney: Secondary | ICD-10-CM | POA: Diagnosis not present

## 2017-02-21 DIAGNOSIS — I700 Atherosclerosis of aorta: Secondary | ICD-10-CM | POA: Diagnosis present

## 2017-02-21 DIAGNOSIS — I959 Hypotension, unspecified: Secondary | ICD-10-CM | POA: Diagnosis not present

## 2017-02-21 DIAGNOSIS — N2581 Secondary hyperparathyroidism of renal origin: Secondary | ICD-10-CM | POA: Diagnosis not present

## 2017-02-21 DIAGNOSIS — K753 Granulomatous hepatitis, not elsewhere classified: Secondary | ICD-10-CM | POA: Diagnosis present

## 2017-02-21 DIAGNOSIS — M1990 Unspecified osteoarthritis, unspecified site: Secondary | ICD-10-CM | POA: Diagnosis present

## 2017-02-21 DIAGNOSIS — E1121 Type 2 diabetes mellitus with diabetic nephropathy: Secondary | ICD-10-CM | POA: Diagnosis not present

## 2017-02-21 DIAGNOSIS — I120 Hypertensive chronic kidney disease with stage 5 chronic kidney disease or end stage renal disease: Secondary | ICD-10-CM | POA: Diagnosis not present

## 2017-02-21 DIAGNOSIS — F17211 Nicotine dependence, cigarettes, in remission: Secondary | ICD-10-CM | POA: Diagnosis not present

## 2017-02-21 DIAGNOSIS — R918 Other nonspecific abnormal finding of lung field: Secondary | ICD-10-CM | POA: Diagnosis not present

## 2017-02-21 DIAGNOSIS — I21A1 Myocardial infarction type 2: Secondary | ICD-10-CM | POA: Diagnosis not present

## 2017-02-21 DIAGNOSIS — I214 Non-ST elevation (NSTEMI) myocardial infarction: Secondary | ICD-10-CM | POA: Diagnosis not present

## 2017-02-21 DIAGNOSIS — Z992 Dependence on renal dialysis: Secondary | ICD-10-CM | POA: Diagnosis not present

## 2017-02-21 DIAGNOSIS — Z7982 Long term (current) use of aspirin: Secondary | ICD-10-CM | POA: Diagnosis not present

## 2017-02-21 DIAGNOSIS — R195 Other fecal abnormalities: Secondary | ICD-10-CM | POA: Diagnosis not present

## 2017-02-21 DIAGNOSIS — E785 Hyperlipidemia, unspecified: Secondary | ICD-10-CM | POA: Diagnosis present

## 2017-02-21 DIAGNOSIS — Z87891 Personal history of nicotine dependence: Secondary | ICD-10-CM | POA: Diagnosis not present

## 2017-02-21 DIAGNOSIS — Z794 Long term (current) use of insulin: Secondary | ICD-10-CM | POA: Diagnosis not present

## 2017-03-10 DIAGNOSIS — R55 Syncope and collapse: Secondary | ICD-10-CM | POA: Diagnosis not present

## 2017-03-10 DIAGNOSIS — D500 Iron deficiency anemia secondary to blood loss (chronic): Secondary | ICD-10-CM | POA: Diagnosis not present

## 2017-03-10 DIAGNOSIS — E119 Type 2 diabetes mellitus without complications: Secondary | ICD-10-CM | POA: Diagnosis not present

## 2017-03-10 DIAGNOSIS — K922 Gastrointestinal hemorrhage, unspecified: Secondary | ICD-10-CM | POA: Diagnosis not present

## 2017-03-10 DIAGNOSIS — D649 Anemia, unspecified: Secondary | ICD-10-CM | POA: Diagnosis not present

## 2017-03-10 DIAGNOSIS — K921 Melena: Secondary | ICD-10-CM | POA: Diagnosis not present

## 2017-03-10 DIAGNOSIS — R197 Diarrhea, unspecified: Secondary | ICD-10-CM | POA: Diagnosis not present

## 2017-03-10 DIAGNOSIS — Z7982 Long term (current) use of aspirin: Secondary | ICD-10-CM | POA: Diagnosis not present

## 2017-03-10 DIAGNOSIS — Z87891 Personal history of nicotine dependence: Secondary | ICD-10-CM | POA: Diagnosis not present

## 2017-03-10 DIAGNOSIS — R195 Other fecal abnormalities: Secondary | ICD-10-CM | POA: Diagnosis not present

## 2017-03-10 DIAGNOSIS — K625 Hemorrhage of anus and rectum: Secondary | ICD-10-CM | POA: Diagnosis not present

## 2017-03-10 DIAGNOSIS — Z794 Long term (current) use of insulin: Secondary | ICD-10-CM | POA: Diagnosis not present

## 2017-03-10 DIAGNOSIS — I10 Essential (primary) hypertension: Secondary | ICD-10-CM | POA: Diagnosis not present

## 2017-03-10 DIAGNOSIS — Z961 Presence of intraocular lens: Secondary | ICD-10-CM | POA: Diagnosis present

## 2017-03-10 DIAGNOSIS — K648 Other hemorrhoids: Secondary | ICD-10-CM | POA: Diagnosis not present

## 2017-03-10 DIAGNOSIS — D62 Acute posthemorrhagic anemia: Secondary | ICD-10-CM | POA: Diagnosis not present

## 2017-03-10 DIAGNOSIS — N2581 Secondary hyperparathyroidism of renal origin: Secondary | ICD-10-CM | POA: Diagnosis not present

## 2017-03-10 DIAGNOSIS — I120 Hypertensive chronic kidney disease with stage 5 chronic kidney disease or end stage renal disease: Secondary | ICD-10-CM | POA: Diagnosis not present

## 2017-03-10 DIAGNOSIS — E785 Hyperlipidemia, unspecified: Secondary | ICD-10-CM | POA: Diagnosis not present

## 2017-03-10 DIAGNOSIS — K5730 Diverticulosis of large intestine without perforation or abscess without bleeding: Secondary | ICD-10-CM | POA: Diagnosis not present

## 2017-03-10 DIAGNOSIS — D631 Anemia in chronic kidney disease: Secondary | ICD-10-CM | POA: Diagnosis not present

## 2017-03-10 DIAGNOSIS — E1122 Type 2 diabetes mellitus with diabetic chronic kidney disease: Secondary | ICD-10-CM | POA: Diagnosis not present

## 2017-03-10 DIAGNOSIS — N186 End stage renal disease: Secondary | ICD-10-CM | POA: Diagnosis not present

## 2017-03-10 DIAGNOSIS — K5521 Angiodysplasia of colon with hemorrhage: Secondary | ICD-10-CM | POA: Diagnosis not present

## 2017-03-10 DIAGNOSIS — Z992 Dependence on renal dialysis: Secondary | ICD-10-CM | POA: Diagnosis not present

## 2017-03-10 DIAGNOSIS — I959 Hypotension, unspecified: Secondary | ICD-10-CM | POA: Diagnosis not present

## 2017-03-15 DIAGNOSIS — E1159 Type 2 diabetes mellitus with other circulatory complications: Secondary | ICD-10-CM | POA: Diagnosis not present

## 2017-03-15 DIAGNOSIS — L84 Corns and callosities: Secondary | ICD-10-CM | POA: Diagnosis not present

## 2017-03-15 DIAGNOSIS — B351 Tinea unguium: Secondary | ICD-10-CM | POA: Diagnosis not present

## 2017-04-10 DIAGNOSIS — N184 Chronic kidney disease, stage 4 (severe): Secondary | ICD-10-CM | POA: Diagnosis not present

## 2017-04-10 DIAGNOSIS — N186 End stage renal disease: Secondary | ICD-10-CM | POA: Diagnosis not present

## 2017-04-10 DIAGNOSIS — I120 Hypertensive chronic kidney disease with stage 5 chronic kidney disease or end stage renal disease: Secondary | ICD-10-CM | POA: Diagnosis not present

## 2017-04-10 DIAGNOSIS — E1122 Type 2 diabetes mellitus with diabetic chronic kidney disease: Secondary | ICD-10-CM | POA: Diagnosis not present

## 2017-04-10 DIAGNOSIS — I10 Essential (primary) hypertension: Secondary | ICD-10-CM | POA: Diagnosis not present

## 2017-04-10 DIAGNOSIS — D469 Myelodysplastic syndrome, unspecified: Secondary | ICD-10-CM | POA: Diagnosis not present

## 2017-04-10 DIAGNOSIS — I252 Old myocardial infarction: Secondary | ICD-10-CM | POA: Diagnosis not present

## 2017-04-10 DIAGNOSIS — D649 Anemia, unspecified: Secondary | ICD-10-CM | POA: Diagnosis not present

## 2017-04-10 DIAGNOSIS — Z992 Dependence on renal dialysis: Secondary | ICD-10-CM | POA: Diagnosis not present

## 2017-05-24 DIAGNOSIS — E1159 Type 2 diabetes mellitus with other circulatory complications: Secondary | ICD-10-CM | POA: Diagnosis not present

## 2017-05-24 DIAGNOSIS — L84 Corns and callosities: Secondary | ICD-10-CM | POA: Diagnosis not present

## 2017-05-24 DIAGNOSIS — B351 Tinea unguium: Secondary | ICD-10-CM | POA: Diagnosis not present

## 2017-08-28 DIAGNOSIS — B351 Tinea unguium: Secondary | ICD-10-CM | POA: Diagnosis not present

## 2017-08-28 DIAGNOSIS — L84 Corns and callosities: Secondary | ICD-10-CM | POA: Diagnosis not present

## 2017-08-28 DIAGNOSIS — E1159 Type 2 diabetes mellitus with other circulatory complications: Secondary | ICD-10-CM | POA: Diagnosis not present

## 2017-09-29 DIAGNOSIS — Z794 Long term (current) use of insulin: Secondary | ICD-10-CM | POA: Diagnosis not present

## 2017-09-29 DIAGNOSIS — D649 Anemia, unspecified: Secondary | ICD-10-CM | POA: Diagnosis not present

## 2017-09-29 DIAGNOSIS — E1122 Type 2 diabetes mellitus with diabetic chronic kidney disease: Secondary | ICD-10-CM | POA: Diagnosis not present

## 2017-09-29 DIAGNOSIS — I120 Hypertensive chronic kidney disease with stage 5 chronic kidney disease or end stage renal disease: Secondary | ICD-10-CM | POA: Diagnosis not present

## 2017-09-29 DIAGNOSIS — I252 Old myocardial infarction: Secondary | ICD-10-CM | POA: Diagnosis not present

## 2017-09-29 DIAGNOSIS — Z87891 Personal history of nicotine dependence: Secondary | ICD-10-CM | POA: Diagnosis not present

## 2017-09-29 DIAGNOSIS — E785 Hyperlipidemia, unspecified: Secondary | ICD-10-CM | POA: Diagnosis not present

## 2017-09-29 DIAGNOSIS — I2510 Atherosclerotic heart disease of native coronary artery without angina pectoris: Secondary | ICD-10-CM | POA: Diagnosis not present

## 2017-09-29 DIAGNOSIS — N186 End stage renal disease: Secondary | ICD-10-CM | POA: Diagnosis not present

## 2017-10-16 DIAGNOSIS — Z23 Encounter for immunization: Secondary | ICD-10-CM | POA: Diagnosis not present

## 2017-11-27 DIAGNOSIS — R002 Palpitations: Secondary | ICD-10-CM | POA: Diagnosis not present

## 2017-11-27 DIAGNOSIS — R0789 Other chest pain: Secondary | ICD-10-CM | POA: Diagnosis not present

## 2017-12-20 DIAGNOSIS — B351 Tinea unguium: Secondary | ICD-10-CM | POA: Diagnosis not present

## 2017-12-20 DIAGNOSIS — L84 Corns and callosities: Secondary | ICD-10-CM | POA: Diagnosis not present

## 2017-12-20 DIAGNOSIS — E1159 Type 2 diabetes mellitus with other circulatory complications: Secondary | ICD-10-CM | POA: Diagnosis not present

## 2018-01-31 DIAGNOSIS — I252 Old myocardial infarction: Secondary | ICD-10-CM | POA: Diagnosis not present

## 2018-01-31 DIAGNOSIS — R079 Chest pain, unspecified: Secondary | ICD-10-CM | POA: Diagnosis not present

## 2018-01-31 DIAGNOSIS — R0600 Dyspnea, unspecified: Secondary | ICD-10-CM | POA: Diagnosis not present

## 2018-01-31 DIAGNOSIS — I10 Essential (primary) hypertension: Secondary | ICD-10-CM | POA: Diagnosis not present

## 2018-02-01 DIAGNOSIS — R9431 Abnormal electrocardiogram [ECG] [EKG]: Secondary | ICD-10-CM | POA: Diagnosis not present

## 2018-02-26 DIAGNOSIS — L84 Corns and callosities: Secondary | ICD-10-CM | POA: Diagnosis not present

## 2018-02-26 DIAGNOSIS — B351 Tinea unguium: Secondary | ICD-10-CM | POA: Diagnosis not present

## 2018-02-26 DIAGNOSIS — E1159 Type 2 diabetes mellitus with other circulatory complications: Secondary | ICD-10-CM | POA: Diagnosis not present

## 2018-04-30 DIAGNOSIS — E1159 Type 2 diabetes mellitus with other circulatory complications: Secondary | ICD-10-CM | POA: Diagnosis not present

## 2018-04-30 DIAGNOSIS — L84 Corns and callosities: Secondary | ICD-10-CM | POA: Diagnosis not present

## 2018-04-30 DIAGNOSIS — B351 Tinea unguium: Secondary | ICD-10-CM | POA: Diagnosis not present

## 2018-05-15 DIAGNOSIS — I120 Hypertensive chronic kidney disease with stage 5 chronic kidney disease or end stage renal disease: Secondary | ICD-10-CM | POA: Diagnosis not present

## 2018-05-15 DIAGNOSIS — K922 Gastrointestinal hemorrhage, unspecified: Secondary | ICD-10-CM | POA: Diagnosis not present

## 2018-05-15 DIAGNOSIS — Z992 Dependence on renal dialysis: Secondary | ICD-10-CM | POA: Diagnosis not present

## 2018-05-15 DIAGNOSIS — I25118 Atherosclerotic heart disease of native coronary artery with other forms of angina pectoris: Secondary | ICD-10-CM | POA: Diagnosis not present

## 2018-05-15 DIAGNOSIS — E785 Hyperlipidemia, unspecified: Secondary | ICD-10-CM | POA: Diagnosis not present

## 2018-05-15 DIAGNOSIS — I252 Old myocardial infarction: Secondary | ICD-10-CM | POA: Diagnosis not present

## 2018-05-15 DIAGNOSIS — N186 End stage renal disease: Secondary | ICD-10-CM | POA: Diagnosis not present

## 2018-05-15 DIAGNOSIS — E1122 Type 2 diabetes mellitus with diabetic chronic kidney disease: Secondary | ICD-10-CM | POA: Diagnosis not present

## 2018-07-09 DIAGNOSIS — E1159 Type 2 diabetes mellitus with other circulatory complications: Secondary | ICD-10-CM | POA: Diagnosis not present

## 2018-07-09 DIAGNOSIS — B351 Tinea unguium: Secondary | ICD-10-CM | POA: Diagnosis not present

## 2018-07-09 DIAGNOSIS — L84 Corns and callosities: Secondary | ICD-10-CM | POA: Diagnosis not present

## 2018-08-06 ENCOUNTER — Other Ambulatory Visit: Payer: Self-pay

## 2018-08-06 ENCOUNTER — Encounter (HOSPITAL_BASED_OUTPATIENT_CLINIC_OR_DEPARTMENT_OTHER): Payer: Self-pay | Admitting: *Deleted

## 2018-08-06 ENCOUNTER — Emergency Department (HOSPITAL_BASED_OUTPATIENT_CLINIC_OR_DEPARTMENT_OTHER): Payer: Medicare Other

## 2018-08-06 ENCOUNTER — Inpatient Hospital Stay (HOSPITAL_BASED_OUTPATIENT_CLINIC_OR_DEPARTMENT_OTHER)
Admission: EM | Admit: 2018-08-06 | Discharge: 2018-08-10 | DRG: 246 | Disposition: A | Discharge status: Home / Self Care | Payer: Medicare Other | Attending: Family Medicine | Admitting: Family Medicine

## 2018-08-06 DIAGNOSIS — N2581 Secondary hyperparathyroidism of renal origin: Secondary | ICD-10-CM | POA: Diagnosis not present

## 2018-08-06 DIAGNOSIS — R079 Chest pain, unspecified: Secondary | ICD-10-CM | POA: Diagnosis not present

## 2018-08-06 DIAGNOSIS — I213 ST elevation (STEMI) myocardial infarction of unspecified site: Secondary | ICD-10-CM

## 2018-08-06 DIAGNOSIS — Z955 Presence of coronary angioplasty implant and graft: Secondary | ICD-10-CM

## 2018-08-06 DIAGNOSIS — E1122 Type 2 diabetes mellitus with diabetic chronic kidney disease: Secondary | ICD-10-CM | POA: Diagnosis present

## 2018-08-06 DIAGNOSIS — I2510 Atherosclerotic heart disease of native coronary artery without angina pectoris: Secondary | ICD-10-CM | POA: Diagnosis not present

## 2018-08-06 DIAGNOSIS — M898X9 Other specified disorders of bone, unspecified site: Secondary | ICD-10-CM | POA: Diagnosis present

## 2018-08-06 DIAGNOSIS — Z7982 Long term (current) use of aspirin: Secondary | ICD-10-CM | POA: Diagnosis not present

## 2018-08-06 DIAGNOSIS — I252 Old myocardial infarction: Secondary | ICD-10-CM | POA: Diagnosis not present

## 2018-08-06 DIAGNOSIS — I214 Non-ST elevation (NSTEMI) myocardial infarction: Secondary | ICD-10-CM | POA: Diagnosis not present

## 2018-08-06 DIAGNOSIS — Z833 Family history of diabetes mellitus: Secondary | ICD-10-CM | POA: Diagnosis not present

## 2018-08-06 DIAGNOSIS — I10 Essential (primary) hypertension: Secondary | ICD-10-CM | POA: Diagnosis not present

## 2018-08-06 DIAGNOSIS — Z823 Family history of stroke: Secondary | ICD-10-CM

## 2018-08-06 DIAGNOSIS — E119 Type 2 diabetes mellitus without complications: Secondary | ICD-10-CM

## 2018-08-06 DIAGNOSIS — I25110 Atherosclerotic heart disease of native coronary artery with unstable angina pectoris: Secondary | ICD-10-CM | POA: Diagnosis not present

## 2018-08-06 DIAGNOSIS — Z794 Long term (current) use of insulin: Secondary | ICD-10-CM | POA: Diagnosis not present

## 2018-08-06 DIAGNOSIS — Z85528 Personal history of other malignant neoplasm of kidney: Secondary | ICD-10-CM | POA: Diagnosis not present

## 2018-08-06 DIAGNOSIS — E1159 Type 2 diabetes mellitus with other circulatory complications: Secondary | ICD-10-CM | POA: Diagnosis not present

## 2018-08-06 DIAGNOSIS — I2582 Chronic total occlusion of coronary artery: Secondary | ICD-10-CM | POA: Diagnosis present

## 2018-08-06 DIAGNOSIS — Z905 Acquired absence of kidney: Secondary | ICD-10-CM | POA: Diagnosis not present

## 2018-08-06 DIAGNOSIS — D631 Anemia in chronic kidney disease: Secondary | ICD-10-CM | POA: Diagnosis present

## 2018-08-06 DIAGNOSIS — Z992 Dependence on renal dialysis: Secondary | ICD-10-CM | POA: Diagnosis not present

## 2018-08-06 DIAGNOSIS — D638 Anemia in other chronic diseases classified elsewhere: Secondary | ICD-10-CM | POA: Diagnosis not present

## 2018-08-06 DIAGNOSIS — Z1159 Encounter for screening for other viral diseases: Secondary | ICD-10-CM

## 2018-08-06 DIAGNOSIS — I361 Nonrheumatic tricuspid (valve) insufficiency: Secondary | ICD-10-CM | POA: Diagnosis not present

## 2018-08-06 DIAGNOSIS — Z87891 Personal history of nicotine dependence: Secondary | ICD-10-CM | POA: Diagnosis not present

## 2018-08-06 DIAGNOSIS — I340 Nonrheumatic mitral (valve) insufficiency: Secondary | ICD-10-CM | POA: Diagnosis not present

## 2018-08-06 DIAGNOSIS — N186 End stage renal disease: Secondary | ICD-10-CM

## 2018-08-06 DIAGNOSIS — E7800 Pure hypercholesterolemia, unspecified: Secondary | ICD-10-CM

## 2018-08-06 DIAGNOSIS — I132 Hypertensive heart and chronic kidney disease with heart failure and with stage 5 chronic kidney disease, or end stage renal disease: Secondary | ICD-10-CM | POA: Diagnosis not present

## 2018-08-06 DIAGNOSIS — I120 Hypertensive chronic kidney disease with stage 5 chronic kidney disease or end stage renal disease: Secondary | ICD-10-CM | POA: Diagnosis not present

## 2018-08-06 DIAGNOSIS — I5043 Acute on chronic combined systolic (congestive) and diastolic (congestive) heart failure: Secondary | ICD-10-CM | POA: Diagnosis not present

## 2018-08-06 DIAGNOSIS — E861 Hypovolemia: Secondary | ICD-10-CM | POA: Diagnosis present

## 2018-08-06 DIAGNOSIS — Z20828 Contact with and (suspected) exposure to other viral communicable diseases: Secondary | ICD-10-CM | POA: Diagnosis not present

## 2018-08-06 HISTORY — DX: Dependence on renal dialysis: Z99.2

## 2018-08-06 LAB — COMPREHENSIVE METABOLIC PANEL
ALT: 27 U/L (ref 0–44)
AST: 32 U/L (ref 15–41)
Albumin: 3.5 g/dL (ref 3.5–5.0)
Alkaline Phosphatase: 70 U/L (ref 38–126)
Anion gap: 19 — ABNORMAL HIGH (ref 5–15)
BUN: 55 mg/dL — ABNORMAL HIGH (ref 8–23)
CO2: 23 mmol/L (ref 22–32)
Calcium: 8.4 mg/dL — ABNORMAL LOW (ref 8.9–10.3)
Chloride: 86 mmol/L — ABNORMAL LOW (ref 98–111)
Creatinine, Ser: 9.43 mg/dL — ABNORMAL HIGH (ref 0.61–1.24)
GFR calc Af Amer: 5 mL/min — ABNORMAL LOW (ref >60)
GFR calc non Af Amer: 5 mL/min — ABNORMAL LOW (ref >60)
Glucose, Bld: 208 mg/dL — ABNORMAL HIGH (ref 70–99)
Potassium: 4 mmol/L (ref 3.5–5.1)
Sodium: 128 mmol/L — ABNORMAL LOW (ref 135–145)
Total Bilirubin: 0.7 mg/dL (ref 0.3–1.2)
Total Protein: 7.7 g/dL (ref 6.5–8.1)

## 2018-08-06 LAB — LIPID PANEL
Cholesterol: 202 mg/dL — ABNORMAL HIGH (ref 0–200)
HDL: 68 mg/dL (ref >40)
Total CHOL/HDL Ratio: 3 RATIO
Triglycerides: 200 mg/dL — ABNORMAL HIGH (ref <150)
VLDL: 40 mg/dL (ref 0–40)

## 2018-08-06 LAB — CBC WITH DIFFERENTIAL/PLATELET
Abs Immature Granulocytes: 0.03 10*3/uL (ref 0.00–0.07)
Basophils Absolute: 0 10*3/uL (ref 0.0–0.1)
Basophils Relative: 0 %
Eosinophils Absolute: 0.2 10*3/uL (ref 0.0–0.5)
Eosinophils Relative: 2 %
HCT: 23.4 % — ABNORMAL LOW (ref 39.0–52.0)
Hemoglobin: 7.7 g/dL — ABNORMAL LOW (ref 13.0–17.0)
Immature Granulocytes: 0 %
Lymphocytes Relative: 9 %
Lymphs Abs: 0.9 10*3/uL (ref 0.7–4.0)
MCH: 32.5 pg (ref 26.0–34.0)
MCHC: 32.9 g/dL (ref 30.0–36.0)
MCV: 98.7 fL (ref 80.0–100.0)
Monocytes Absolute: 1 10*3/uL (ref 0.1–1.0)
Monocytes Relative: 10 %
Neutro Abs: 7.3 10*3/uL (ref 1.7–7.7)
Neutrophils Relative %: 79 %
Platelets: 209 10*3/uL (ref 150–400)
RBC: 2.37 MIL/uL — ABNORMAL LOW (ref 4.22–5.81)
RDW: 12.6 % (ref 11.5–15.5)
WBC: 9.3 10*3/uL (ref 4.0–10.5)
nRBC: 0 % (ref 0.0–0.2)

## 2018-08-06 LAB — BRAIN NATRIURETIC PEPTIDE: B Natriuretic Peptide: 662.8 pg/mL — ABNORMAL HIGH (ref 0.0–100.0)

## 2018-08-06 LAB — SARS CORONAVIRUS 2 AG (30 MIN TAT): SARS Coronavirus 2 Ag: NEGATIVE

## 2018-08-06 LAB — TROPONIN I (HIGH SENSITIVITY): Troponin I (High Sensitivity): 242 ng/L (ref <18)

## 2018-08-06 IMAGING — DX PORTABLE CHEST - 1 VIEW
1 series · 1 of 1 positions shown · non-contrast
Comparison: [DATE]

CLINICAL DATA: Chest pain

EXAM:
PORTABLE CHEST 1 VIEW

[chest ap]
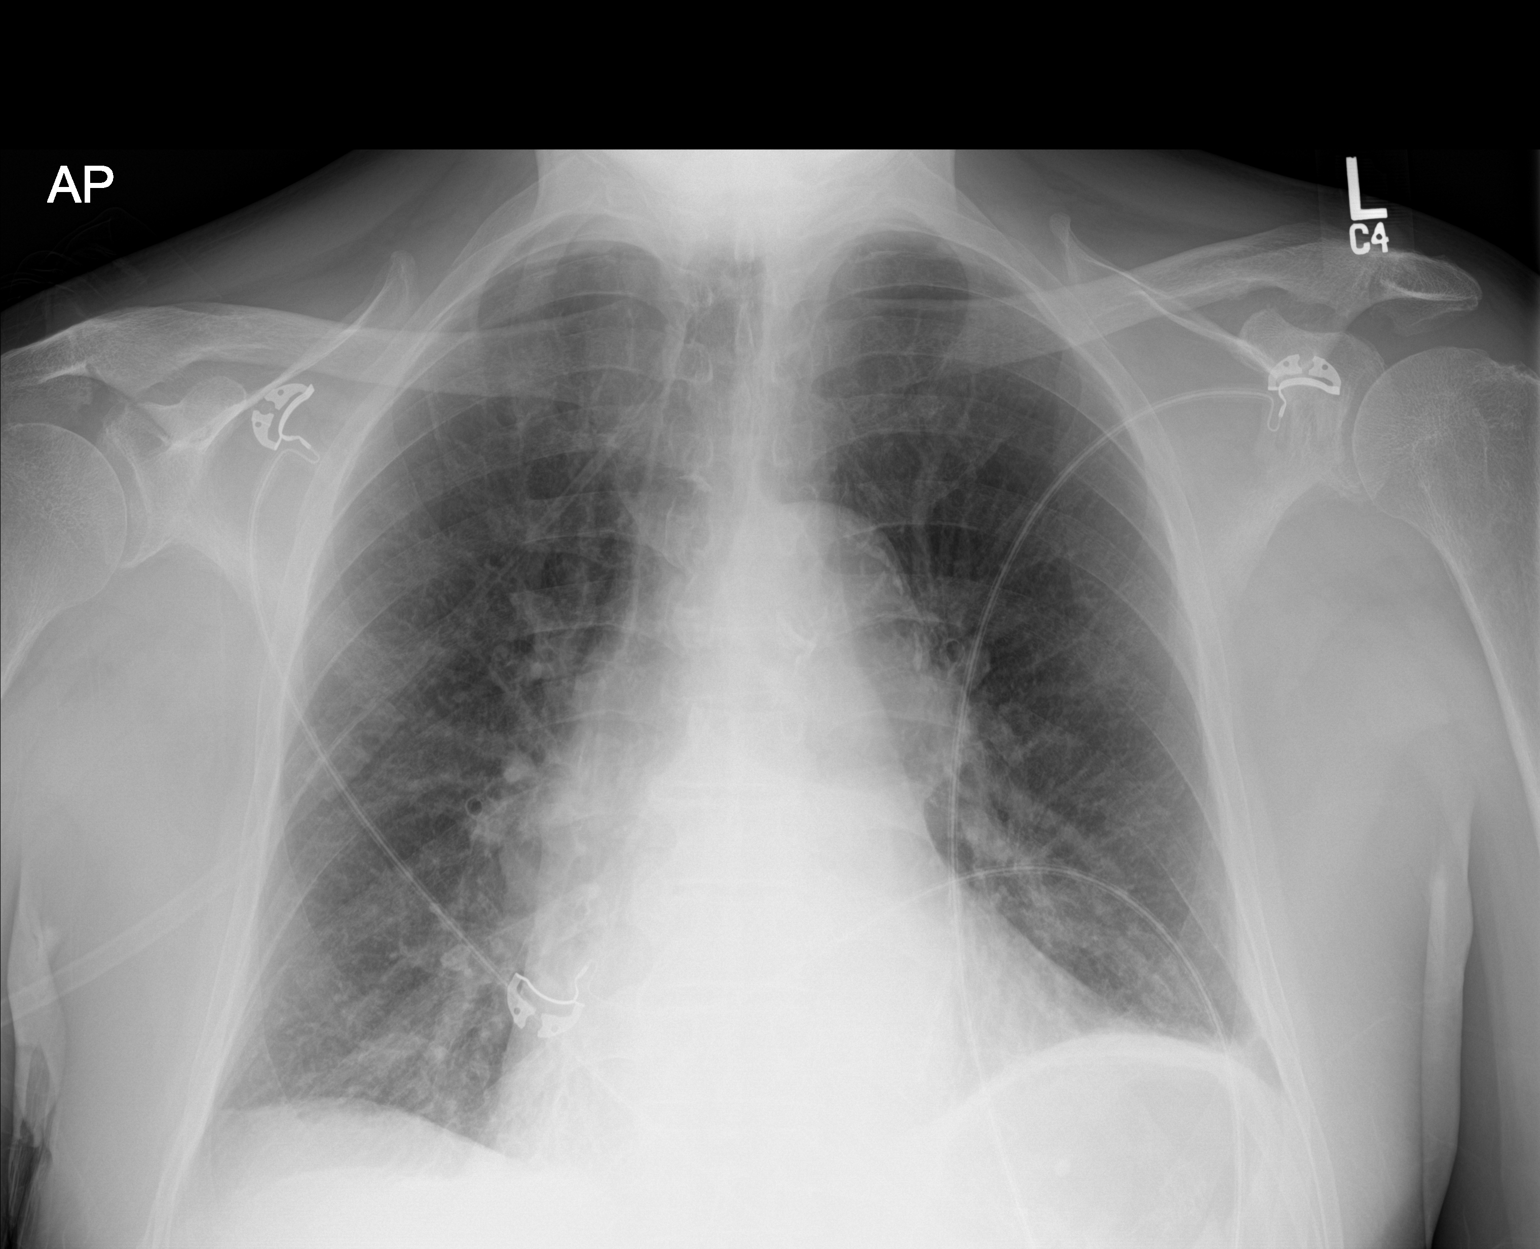

[1 of 1 positions shown; findings below may reference images not displayed]

FINDINGS: Mild cardiomegaly with vascular congestion. No confluent opacities
or effusions. Mild hyperinflation of the lungs. No overt edema,
confluent opacities or effusions. No acute bony abnormality.
IMPRESSION: Borderline cardiomegaly.  Mild vascular congestion.

Hyperinflation.

## 2018-08-06 MED ORDER — SODIUM CHLORIDE 0.9 % IV SOLN
INTRAVENOUS | Status: DC
  Administered 2018-08-09 – 2018-08-10 (×2): 10 mL/h via INTRAVENOUS

## 2018-08-06 MED ORDER — ONDANSETRON HCL 4 MG PO TABS: 4.0000 mg | ORAL_TABLET | Freq: Four times a day (QID) | ORAL | Status: DC | PRN

## 2018-08-06 MED ORDER — ACETAMINOPHEN 650 MG RE SUPP: 650.0000 mg | Freq: Four times a day (QID) | RECTAL | Status: DC | PRN

## 2018-08-06 MED ORDER — HEPARIN (PORCINE) 25000 UT/250ML-% IV SOLN
850.0000 [IU]/h | INTRAVENOUS | Status: DC
  Filled 2018-08-06: qty 250

## 2018-08-06 MED ORDER — HEPARIN (PORCINE) 25000 UT/250ML-% IV SOLN
INTRAVENOUS | Status: AC
  Administered 2018-08-06: 19:00:00 14 [IU]/kg/h via INTRAVENOUS
  Filled 2018-08-06: qty 250

## 2018-08-06 MED ORDER — SODIUM CHLORIDE 0.9% FLUSH
3.0000 mL | Freq: Two times a day (BID) | INTRAVENOUS | Status: DC
  Administered 2018-08-07: 3 mL via INTRAVENOUS

## 2018-08-06 MED ORDER — ASPIRIN 81 MG PO CHEW
324.0000 mg | CHEWABLE_TABLET | Freq: Once | ORAL | Status: AC
  Administered 2018-08-06: 324 mg via ORAL

## 2018-08-06 MED ORDER — HEPARIN (PORCINE) 25000 UT/250ML-% IV SOLN
12.0000 [IU]/kg/h | INTRAVENOUS | Status: DC
  Administered 2018-08-06: 19:00:00 14 [IU]/kg/h via INTRAVENOUS

## 2018-08-06 MED ORDER — ONDANSETRON HCL 4 MG/2ML IJ SOLN: 4.0000 mg | Freq: Four times a day (QID) | INTRAMUSCULAR | Status: DC | PRN

## 2018-08-06 MED ORDER — ASPIRIN 81 MG PO CHEW
324.0000 mg | CHEWABLE_TABLET | Freq: Once | ORAL | Status: DC
  Filled 2018-08-06: qty 4

## 2018-08-06 MED ORDER — METOPROLOL TARTRATE 12.5 MG HALF TABLET
12.5000 mg | ORAL_TABLET | Freq: Two times a day (BID) | ORAL | Status: DC
  Administered 2018-08-07 – 2018-08-10 (×5): 12.5 mg via ORAL
  Filled 2018-08-06 (×5): qty 1

## 2018-08-06 MED ORDER — HEPARIN SODIUM (PORCINE) 5000 UNIT/ML IJ SOLN
4000.0000 [IU] | Freq: Once | INTRAMUSCULAR | Status: AC
  Administered 2018-08-06: 4000 [IU] via INTRAVENOUS
  Filled 2018-08-06: qty 1

## 2018-08-06 MED ORDER — ATORVASTATIN CALCIUM 40 MG PO TABS
40.0000 mg | ORAL_TABLET | Freq: Every day | ORAL | Status: DC
  Administered 2018-08-07 (×2): 40 mg via ORAL
  Filled 2018-08-06 (×2): qty 1

## 2018-08-06 MED ORDER — NITROGLYCERIN 0.4 MG SL SUBL
0.4000 mg | SUBLINGUAL_TABLET | SUBLINGUAL | Status: DC | PRN
  Filled 2018-08-06: qty 1

## 2018-08-06 MED ORDER — ASPIRIN 325 MG PO TABS: 325.0000 mg | ORAL_TABLET | Freq: Every day | ORAL | Status: DC

## 2018-08-06 MED ORDER — ACETAMINOPHEN 325 MG PO TABS
650.0000 mg | ORAL_TABLET | Freq: Four times a day (QID) | ORAL | Status: DC | PRN
  Administered 2018-08-07: 650 mg via ORAL
  Filled 2018-08-06: qty 2

## 2018-08-06 NOTE — ED Notes (Signed)
Critical lab received; troponin 242; patient en route to [facility] via [facility]; [facility] notified.

## 2018-08-06 NOTE — ED Notes (Signed)
Lab called and stated that Pt/INR and PTT blood tube was hemolyzed.  Pt has just received heparin so baseline levels will not be able to collected.

## 2018-08-06 NOTE — ED Notes (Signed)
Pt resting quietly at this time with eyes closed.  Pt continues to deny any chest pain.

## 2018-08-06 NOTE — ED Provider Notes (Signed)
  Physical Exam  BP 129/70   Pulse 94   Temp 97.7 F (36.5 C) (Temporal)   Resp 19   Ht 5\' 8"  (1.727 m)   Wt 71.2 kg   SpO2 100%   BMI 23.87 kg/m   Physical Exam  ED Course/Procedures     Procedures  MDM  Patient with seen at [facility] and here with chest pain.  His troponin was elevated and code STEMI was initially activated.  Dr. Rogene Houston discussed with Dr. Martinique.  Patient was transferred to the ED to see cardiology.  I discussed with Dr. Martinique myself and per Dr. Martinique, the code STEMI was canceled.  He states that patient is a dialysis patient and does not have any diagnostic STEMI on his EKG.  He is also anemic 7.7 and is not a cath candidate and patient will need medical management. Hospitalist to admit.       Drenda Freeze, MD 08/06/18 [id number]

## 2018-08-06 NOTE — ED Provider Notes (Addendum)
[facility] EMERGENCY DEPARTMENT Provider Note   CSN: [id number] Arrival date & time: 08/06/18  1805     History   Chief Complaint Chief Complaint  Patient presents with  . Chest Pain    HPI Nicholas [name] is a 85 y.o. male.     Patient is a dialysis patient normally dialyzed Tuesday Thursdays and Saturdays.  Was dialyzed on Saturday.  Patient followed by cardiology in the past at [facility].  Patient had a cardiac catheterization in 2009.  Patient does have a history of intermittent pain usually in his back area.  But starting this morning it started to last longer and happen more frequently.  The episode was still ongoing when he came in and that is been going on for about 2 hours.  The episode prior to that had lasted about an hour.  Not associated with shortness of breath or any nausea.  No leg swelling.  No history of fever or upper respiratory symptoms.  Patient's history significant for end-stage renal disease on dialysis diabetes high cholesterol hypertension.  Patient denies any history of bleeding problems.  Patient denies any fevers or any upper respiratory symptoms.     Past Medical History:  Diagnosis Date  . Arthritis   . Chronic kidney disease   . Diabetes mellitus ([place])   . Dialysis patient ([name])   . GERD (gastroesophageal reflux disease)    occ  . Heart murmur   . Hypercholesteremia   . Hypertension   . Psoriasis     Patient Active Problem List   Diagnosis Date Noted  . Lung nodule seen on imaging study 04/30/2012    Past Surgical History:  Procedure Laterality Date  . CARDIAC CATHETERIZATION  09   "some blockage" with collaterals by cath at [facility] ~ 2009; no intervention required; reportedly, no routine cardiology f/u recommended   . INGUINAL HERNIA REPAIR Right 04/13/2012   Procedure: HERNIA REPAIR INGUINAL INCARCERATED;  Surgeon: Gayland Curry, MD;  Location: [facility] [place];  Service: General;  Laterality: Right;  . INSERTION OF MESH Right 04/13/2012   Procedure: INSERTION OF MESH;  Surgeon: Gayland Curry, MD;  Location: [facility];  Service: General;  Laterality: Right;  Marland Kitchen VASECTOMY          Home Medications    Prior to Admission medications   Medication Sig Start Date End Date Taking? Authorizing Provider  guaifenesin (HUMIBID E) 400 MG TABS tablet  04/22/18  Yes [provider]  lidocaine-prilocaine (EMLA) cream  05/10/18  Yes [provider]  metoprolol succinate (TOPROL-XL) 25 MG 24 hr tablet Take by mouth. 08/01/18 08/31/18 Yes [provider]  sevelamer carbonate (RENVELA) 800 MG tablet Take by mouth. 05/09/18  Yes [provider]  acetaminophen (TYLENOL) 325 MG tablet Take by mouth.    [provider]  amLODipine (NORVASC) 10 MG tablet Take 10 mg by mouth daily.    [provider]  aspirin 325 MG tablet Take 325 mg by mouth daily.    [provider]  CHOLECALCIFEROL PO Take 1 tablet by mouth daily.    [provider]  COD LIVER OIL PO Take by mouth.    [provider]  docusate sodium (COLACE) 50 MG capsule Take by mouth 2 (two) times daily.    [provider]  ezetimibe-simvastatin (VYTORIN) 10-40 MG per tablet Take 0.5 tablets by mouth at bedtime.     [provider]  ferrous sulfate 325 (65 FE) MG tablet Take 325 mg  by mouth daily with breakfast.    [provider]  furosemide (LASIX) 40 MG tablet Take 40 mg by mouth daily.    [provider]  insulin NPH-insulin regular (NOVOLIN 70/30) (70-30) 100 UNIT/ML injection Inject 30-40 Units into the skin 2 (two) times daily with a meal. 40 units in am, 30 unit in pm.    [provider]  insulin NPH-regular Human (70-30) 100 UNIT/ML injection Inject into the skin.    [provider]  labetalol (NORMODYNE) 200 MG tablet Take 400 mg by mouth daily.     [provider]  lisinopril (PRINIVIL,ZESTRIL) 40 MG tablet Take 40 mg by mouth daily.     [provider]  niacin 500 MG tablet Take 1,000 mg by mouth daily with breakfast.     [provider]  simvastatin (ZOCOR) 20 MG tablet Take 20 mg by mouth every evening.    [provider]  triamcinolone cream (KENALOG) 0.1 % Apply topically 2 (two) times daily.    [provider]  vardenafil (LEVITRA) 10 MG tablet Take 10 mg by mouth daily as needed for erectile dysfunction.    [provider]    Family History Family History  Problem Relation Age of Onset  . Diabetes Mother   . Stroke Father   . Diabetes Sister   . Diabetes Brother     Social History Social History   Tobacco Use  . Smoking status: Former Smoker    Packs/day: 1.00    Years: 15.00    Pack years: 15.00    Types: Cigarettes    Quit date: 03/29/1987    Years since quitting: 31.3  . Smokeless tobacco: Never Used  Substance Use Topics  . Alcohol use: No  . Drug use: No     Allergies   Patient has no known allergies.   Review of Systems Review of Systems  Constitutional: Negative for chills and fever.  HENT: Negative for congestion, rhinorrhea and sore throat.   Eyes: Negative for visual disturbance.  Respiratory: Negative for cough and shortness of breath.   Cardiovascular: Negative for chest pain and leg swelling.  Gastrointestinal: Negative for abdominal pain, diarrhea, nausea and vomiting.  Genitourinary: Negative for dysuria.  Musculoskeletal: Positive for back pain. Negative for neck pain.  Skin: Negative for rash.  Neurological: Negative for dizziness, light-headedness and headaches.  Hematological: Does not bruise/bleed easily.  Psychiatric/Behavioral: Negative for confusion.     Physical Exam Updated Vital Signs BP 121/62   Pulse 100   Temp 98.5 F (36.9 C) (Oral)   Resp 18   Ht 1.727 m (5\' 8" )   Wt 71.2 kg   SpO2 95%   BMI 23.87 kg/m   Physical Exam Vitals signs and nursing note reviewed.  Constitutional:      Appearance: Normal appearance.  He is well-developed. He is not diaphoretic.  HENT:     Head: Normocephalic and atraumatic.  Eyes:     Extraocular Movements: Extraocular movements intact.     Conjunctiva/sclera: Conjunctivae normal.     Pupils: Pupils are equal, round, and reactive to light.  Neck:     Musculoskeletal: Neck supple.  Cardiovascular:     Rate and Rhythm: Normal rate and regular rhythm.     Heart sounds: No murmur.  Pulmonary:     Effort: Pulmonary effort is normal. No respiratory distress.     Breath sounds: Normal breath sounds.  Chest:     Chest wall: No tenderness.  Abdominal:     Palpations: Abdomen is soft.     Tenderness: There is no abdominal tenderness.  Musculoskeletal: Normal range of motion.        General: No swelling.     Comments: AV fistula left upper extremity.  Skin:    General: Skin is warm and dry.  Neurological:     General: No focal deficit present.     Mental Status: He is alert and oriented to person, place, and time.      ED Treatments / Results  Labs (all labs ordered are listed, but only abnormal results are displayed) Labs Reviewed  SARS CORONAVIRUS 2 (HOSP ORDER, PERFORMED IN [facility] VIA [facility] ID)  CBC  BASIC METABOLIC PANEL  BRAIN NATRIURETIC PEPTIDE  CBC WITH DIFFERENTIAL/PLATELET  PROTIME-INR  APTT  COMPREHENSIVE METABOLIC PANEL  LIPID PANEL  TROPONIN I (HIGH SENSITIVITY)    EKG EKG Interpretation  Date/Time:  Monday August 06 2018 18:28:59 EDT Ventricular Rate:  102 PR Interval:  194 QRS Duration: 104 QT Interval:  342 QTC Calculation: 445 R Axis:   -37 Text Interpretation:  Sinus tachycardia Left axis deviation Marked ST abnormality, possible anterior subendocardial injury Abnormal ECG Confirmed by Fredia Sorrow [phone]) on 08/06/2018 6:38:35 PM   Radiology No results found.  Procedures Procedures (including critical care time)  CRITICAL CARE Performed by: Fredia Sorrow Total critical care time: 30 minutes Critical care  time was exclusive of separately billable procedures and treating other patients. Critical care was necessary to treat or prevent imminent or life-threatening deterioration. Critical care was time spent personally by me on the following activities: development of treatment plan with patient and/or surrogate as well as nursing, discussions with consultants, evaluation of patient's response to treatment, examination of patient, obtaining history from patient or surrogate, ordering and performing treatments and interventions, ordering and review of laboratory studies, ordering and review of radiographic studies, pulse oximetry and re-evaluation of patient's condition.   Medications Ordered in ED Medications  aspirin chewable tablet 324 mg (has no administration in time range)  nitroGLYCERIN (NITROSTAT) SL tablet 0.4 mg (has no administration in time range)  heparin injection 4,000 Units (has no administration in time range)  heparin ADULT infusion 100 units/mL (25000 units/262mL sodium chloride 0.45%) (has no administration in time range)  0.9 %  sodium chloride infusion (has no administration in time range)  aspirin chewable tablet 324 mg (has no administration in time range)  heparin 25000-0.45 UT/250ML-% infusion (has no administration in time range)     Initial Impression / Assessment and Plan / ED Course  I have reviewed the triage vital signs and the nursing notes.  Pertinent labs & imaging results that were available during my care of the patient were reviewed by me and considered in my medical decision making (see chart for details).        Patient's EKG definitely consistent with ischemia concerning for an anterior posterior STEMI.  Was reviewed by the cardiology fellow she recommended calling a STEMI.  Discussed with Dr. Martinique who is on-call for STEMI patient will be transferred to [facility] as a STEMI patient.  And will be evaluated there.  Dr. Martinique agreed with the plan.  Patient  started on heparin patient given aspirin and patient given some nitroglycerin.  Also chest x-ray being done and initial COVID testing has been ordered here.  Patient still with pain.  Following the aspirin patient's chest pain completely resolved over the pain in the back as well completely  resolved.  Final Clinical Impressions(s) / ED Diagnoses   Final diagnoses:  ST elevation myocardial infarction (STEMI), unspecified artery [facility])    ED Discharge Orders    None       Fredia Sorrow, MD 08/06/18 1912    Fredia Sorrow, MD 08/06/18 1918    Fredia Sorrow, MD 08/06/18 1919

## 2018-08-06 NOTE — ED Notes (Signed)
ED TO INPATIENT HANDOFF REPORT  ED Nurse Name and Phone #: Jefferson Fuel, RN  [phone]  S Name/Age/Gender Nicholas [name] 86 y.o. male Room/Bed: TRACC/TRACC  Code Status   Code Status: Not on file  Home/SNF/Other Home Patient oriented to: self, place, time and situation Is this baseline? Yes   Triage Complete: Triage complete  Chief Complaint back pain and chest discomfort  Triage Note Chest pain this am. Pain comes and goes with radiation into his back. States he is being treated for same at [facility].    Allergies Allergies  Allergen Reactions  . Hydrochlorothiazide Other (See Comments)    [facility]    Level of Care/Admitting Diagnosis ED Disposition    ED Disposition Condition [place]: [place] [100100]  Level of Care: Progressive [102]  Covid Evaluation: Confirmed COVID Negative  Diagnosis: NSTEMI (non-ST elevated myocardial infarction) [facility]) [[id number]]  Admitting Physician: Lenore Cordia [[id number]]  Attending Physician: Lenore Cordia [[id number]]  Estimated length of stay: past midnight tomorrow  Certification:: I certify this patient will need inpatient services for at least 2 midnights  PT Class (Do Not Modify): Inpatient [101]  PT Acc Code (Do Not Modify): Private [1]       B Medical/Surgery History Past Medical History:  Diagnosis Date  . Arthritis   . Chronic kidney disease   . Diabetes mellitus ([place])   . Dialysis patient ([place])   . GERD (gastroesophageal reflux disease)    occ  . Heart murmur   . Hypercholesteremia   . Hypertension   . Psoriasis    Past Surgical History:  Procedure Laterality Date  . CARDIAC CATHETERIZATION  09   "some blockage" with collaterals by cath at [facility] ~ 2009; no intervention required; reportedly, no routine cardiology f/u recommended   . INGUINAL HERNIA REPAIR Right 04/13/2012   Procedure: HERNIA REPAIR INGUINAL INCARCERATED;  Surgeon: Gayland Curry, MD;  Location:  [place];  Service: General;  Laterality: Right;  . INSERTION OF MESH Right 04/13/2012   Procedure: INSERTION OF MESH;  Surgeon: Gayland Curry, MD;  Location: [place];  Service: General;  Laterality: Right;  Marland Kitchen VASECTOMY       A IV Location/Drains/Wounds Patient Lines/Drains/Airways Status   Active Line/Drains/Airways    Name:   Placement date:   Placement time:   Site:   Days:   Peripheral IV 04/13/12 Left Hand   04/13/12    0705    Hand   2306   Peripheral IV 08/06/18 Right Antecubital   08/06/18    1852    Antecubital   less than 1   Peripheral IV 08/06/18 Right Forearm   08/06/18    1903    Forearm   less than 1   Incision 04/13/12 Groin Right   04/13/12    0815     2306          Intake/Output Last 24 hours No intake or output data in the 24 hours ending 08/06/18 2217  Labs/Imaging Results for orders placed or performed during the hospital encounter of 08/06/18 (from the past 48 hour(s))  Troponin I (High Sensitivity)     Status: Abnormal   Collection Time: 08/06/18  6:54 PM  Result Value Ref Range   Troponin I (High Sensitivity) 242 (HH) <18 ng/L    Comment: CRITICAL RESULT CALLED TO, READ BACK BY AND VERIFIED WITH: Jaci Carrel RN AT 1930 ON 08/06/18 BY [email or web address] (NOTE)  Elevated high sensitivity troponin I (hsTnI) values and significant  changes across serial measurements may suggest ACS but many other  chronic and acute conditions are known to elevate hsTnI results.  Refer to the Links section for chest pain algorithms and additional  guidance. Performed at [facility], [address] [place], [place] [address]   Brain natriuretic peptide     Status: Abnormal   Collection Time: 08/06/18  6:54 PM  Result Value Ref Range   B Natriuretic Peptide 662.8 (H) 0.0 - 100.0 pg/mL    Comment: Performed at [facility], [address] [place], [place] [address]  CBC with Differential/Platelet     Status: Abnormal   Collection Time: 08/06/18  6:54 PM  Result  Value Ref Range   WBC 9.3 4.0 - 10.5 K/uL   RBC 2.37 (L) 4.22 - 5.81 MIL/uL   Hemoglobin 7.7 (L) 13.0 - 17.0 g/dL   HCT 23.4 (L) 39.0 - 52.0 %   MCV 98.7 80.0 - 100.0 fL   MCH 32.5 26.0 - 34.0 pg   MCHC 32.9 30.0 - 36.0 g/dL   RDW 12.6 11.5 - 15.5 %   Platelets 209 150 - 400 K/uL   nRBC 0.0 0.0 - 0.2 %   Neutrophils Relative % 79 %   Neutro Abs 7.3 1.7 - 7.7 K/uL   Lymphocytes Relative 9 %   Lymphs Abs 0.9 0.7 - 4.0 K/uL   Monocytes Relative 10 %   Monocytes Absolute 1.0 0.1 - 1.0 K/uL   Eosinophils Relative 2 %   Eosinophils Absolute 0.2 0.0 - 0.5 K/uL   Basophils Relative 0 %   Basophils Absolute 0.0 0.0 - 0.1 K/uL   Immature Granulocytes 0 %   Abs Immature Granulocytes 0.03 0.00 - 0.07 K/uL    Comment: Performed at [facility], [address] [place], [place] [address]  Comprehensive metabolic panel     Status: Abnormal   Collection Time: 08/06/18  6:54 PM  Result Value Ref Range   Sodium 128 (L) 135 - 145 mmol/L   Potassium 4.0 3.5 - 5.1 mmol/L   Chloride 86 (L) 98 - 111 mmol/L   CO2 23 22 - 32 mmol/L   Glucose, Bld 208 (H) 70 - 99 mg/dL   BUN 55 (H) 8 - 23 mg/dL   Creatinine, Ser 9.43 (H) 0.61 - 1.24 mg/dL   Calcium 8.4 (L) 8.9 - 10.3 mg/dL   Total Protein 7.7 6.5 - 8.1 g/dL   Albumin 3.5 3.5 - 5.0 g/dL   AST 32 15 - 41 U/L   ALT 27 0 - 44 U/L   Alkaline Phosphatase 70 38 - 126 U/L   Total Bilirubin 0.7 0.3 - 1.2 mg/dL   GFR calc non Af Amer 5 (L) >60 mL/min   GFR calc Af Amer 5 (L) >60 mL/min   Anion gap 19 (H) 5 - 15    Comment: Performed at [facility], [address] [place], [place] [address]  Lipid panel     Status: None (Preliminary result)   Collection Time: 08/06/18  6:54 PM  Result Value Ref Range   Cholesterol PENDING 0 - 200 mg/dL   Triglycerides PENDING <150 mg/dL   HDL PENDING >40 mg/dL   Total CHOL/HDL Ratio PENDING RATIO   VLDL PENDING 0 - 40 mg/dL   LDL Cholesterol NOT CALCULATED 0 - 99 mg/dL    Comment: Performed  at [facility], [address] [place], [place] [address]  SARS Coronavirus 2 (Hosp order,Performed in [facility] lab via [facility] ID)     Status: None   Collection Time: 08/06/18  7:02 PM   Specimen: Dry Nasal Swab ([facility] ID Now)  Result Value Ref Range   SARS Coronavirus 2 ([facility] ID Now) NEGATIVE NEGATIVE    Comment: (NOTE) SARS-CoV-2 target nucleic acids are NOT DETECTED. The SARS-CoV-2 RNA is generally detectable in upper and lower respiratory specimens during the acute phase of infection.  Negativeresults do not preclude SARS-CoV-2 infection, do not rule out coinfections with other pathogens, and should not be used as the  sole basis for treatment or other patient management decisions.  Negative results must be combined with clinical observations, patient history, and epidemiological information. The expected result is Negative. Fact Sheet for Patients: GolfingFamily.no Fact Sheet for Healthcare Providers: https://www.hernandez-brewer.com/ This test is not yet approved or cleared by the [place] FDA and  has been authorized for detection and/or diagnosis of SARS-CoV-2 by FDA under an Emergency Use Authorization (EUA).  This EUA will remain in effect (meaning this test can be used) for the duration of  the COVID19 declaration under Section 5 64(b)(1) of the Act, 21 U.S.C.  section 365-661-5005 3(b)(1), unless the authorization is terminated or revoked sooner. Performed at [facility], [address], [place], [place] [address]    Dg Chest Port 1 View  Result Date: 08/06/2018 CLINICAL DATA:  Chest pain EXAM: PORTABLE CHEST 1 VIEW COMPARISON:  02/21/2017 FINDINGS: Mild cardiomegaly with vascular congestion. No confluent opacities or effusions. Mild hyperinflation of the lungs. No overt edema, confluent opacities or effusions. No acute bony abnormality. IMPRESSION: Borderline cardiomegaly.  Mild vascular congestion.  Hyperinflation. Electronically Signed   By: Rolm Baptise M.D.   On: 08/06/2018 19:30    Pending Labs Unresulted Labs (From admission, onward)    Start     Ordered   08/08/18 0500  Heparin level (unfractionated)  Daily,   R     08/06/18 2022   08/07/18 0500  CBC  Daily,   R     08/06/18 2022   08/07/18 0400  Heparin level (unfractionated)  Once-Timed,   STAT     08/06/18 2022          Vitals/Pain Today's Vitals   08/06/18 2130 08/06/18 2145 08/06/18 2200 08/06/18 2215  BP: 120/71 111/63 122/72 118/70  Pulse: 86 81 84 81  Resp: 19 (!) 22 20 18   Temp:      TempSrc:      SpO2: 100% 100% 100% 100%  Weight:      Height:      PainSc:        Isolation Precautions No active isolations  Medications Medications  nitroGLYCERIN (NITROSTAT) SL tablet 0.4 mg (0.4 mg Sublingual Not Given 08/06/18 1927)  0.9 %  sodium chloride infusion (has no administration in time range)  heparin ADULT infusion 100 units/mL (25000 units/234mL sodium chloride 0.45%) (850 Units/hr Intravenous Rate/Dose Change 08/06/18 1958)  heparin injection 4,000 Units (4,000 Units Intravenous Given 08/06/18 1910)  aspirin chewable tablet 324 mg (324 mg Oral Given 08/06/18 1904)    Mobility walks Low fall risk   Focused Assessments Pt denies any chest pain at this time   R Recommendations: See Admitting Provider Note  Report given to:   Additional Notes:

## 2018-08-06 NOTE — H&P (Signed)
History and Physical    Nicholas [name] NGE:[id number] DOB: [birth year] DOA: 08/06/2018  PCP: Carol Ada, MD  Patient coming from: [place] have personally briefly reviewed patient's old medical records in [place]  Chief Complaint: Chest pain  HPI: Nicholas [name] is a 85 y.o. male with medical history significant for CAD with chronic stable angina and history of end STEMI, ESRD on TTS HD, insulin-dependent type 2 diabetes, hypertension, hyperlipidemia, and anemia of chronic disease who presented to the [facility] ED for evaluation of chest pain.  Patient states he has been having intermittent episodes of pressure or squeezing type chest pain that has been occurring intermittently over the last year.  He says the symptoms began when his previous beta-blocker was discontinued due to low blood pressures.  He had a similar chest pain episode early this morning which resolved after 30-60 seconds with rest.  He went about his day and after eating lunch he had a recurrent more severe episode and therefore presented to the [facility] ED for further evaluation.  He reports associated shortness of breath and palpitations with the episodes.  He denies any nausea, vomiting, diaphoresis, abdominal pain, or swelling in either of his legs.  ED Course:  Initially presented to [place] which time vitals showed BP 121/62, pulse 100, RR 18, temp 98.5 Fahrenheit, SPO2 95%.  Labs notable for WBC 9.3, hemoglobin 7.7, platelets 209,000, Sodium 128, potassium 4.0, Bicarb 23, BUN 55, creatinine 9.43,BNP 662.8, troponin I 242, SARS-CoV-2 test negative.  Chest x-ray showed hyperinflated lung fields with vascular congestion, no focal consolidation. EKG was concerning for ischemia with possible anterior posterior STEMI.  The ED physician discussed the case with on-call cardiology fellow who recommended calling a code STEMI.  Troponin was noted to be elevated to 242.  Patient  was given aspirin 324 mg and started on IV heparin.  Patient was subsequently transferred to the [facility] ED.  In the [facility] ED, cardiology reviewed the case and felt there was not a diagnostic STEMI on EKG.  Per EDP cardiology, if it was felt that patient was not a cath candidate and that he be treated with medical management alone and for hospitalist to admit.  The hospitalist service was consulted to admit for further evaluation management.   Review of Systems: All systems reviewed and are negative except as documented in history of present illness above.   Past Medical History:  Diagnosis Date  . Arthritis   . Chronic kidney disease   . Diabetes mellitus ([place])   . Dialysis patient ([place])   . GERD (gastroesophageal reflux disease)    occ  . Heart murmur   . Hypercholesteremia   . Hypertension   . Psoriasis     Past Surgical History:  Procedure Laterality Date  . CARDIAC CATHETERIZATION  09   "some blockage" with collaterals by cath at [facility] ~ 2009; no intervention required; reportedly, no routine cardiology f/u recommended   . INGUINAL HERNIA REPAIR Right 04/13/2012   Procedure: HERNIA REPAIR INGUINAL INCARCERATED;  Surgeon: Gayland Curry, MD;  Location: [place];  Service: General;  Laterality: Right;  . INSERTION OF MESH Right 04/13/2012   Procedure: INSERTION OF MESH;  Surgeon: Gayland Curry, MD;  Location: [place];  Service: General;  Laterality: Right;  Marland Kitchen VASECTOMY      Social History:  reports that he quit smoking about 31 years ago. His smoking use included cigarettes. He has a  15.00 pack-year smoking history. He has never used smokeless tobacco. He reports that he does not drink alcohol or use drugs.  Allergies  Allergen Reactions  . Hydrochlorothiazide Other (See Comments)    [facility]    Family History  Problem Relation Age of Onset  . Diabetes Mother   . Stroke Father   . Diabetes Sister   . Diabetes Brother      Prior to Admission medications    Medication Sig Start Date End Date Taking? Authorizing Provider  aspirin 325 MG tablet Take 325 mg by mouth daily.   Yes [provider]  atorvastatin (LIPITOR) 40 MG tablet Take 40 mg by mouth at bedtime. For cholesterol   Yes [provider]  insulin NPH-insulin regular (NOVOLIN 70/30) (70-30) 100 UNIT/ML injection Inject 20 Units into the skin 2 (two) times daily as needed (CBG >100).    Yes [provider]  metoprolol succinate (TOPROL-XL) 50 MG 24 hr tablet Take 25-50 mg by mouth See admin instructions. Take 1/2 tablet (25 mg) on dialysis days - Tuesday, Thursday, Saturday. Take 1 tablet (50 mg) on non-dialysis days - Sunday, Monday, Wednesday, Friday. Take with or immediately following a meal.   Yes [provider]  PRESCRIPTION MEDICATION Take 1 tablet by mouth daily.   Yes [provider]  sevelamer carbonate (RENVELA) 800 MG tablet Take 1,600 mg by mouth 3 (three) times daily with meals.  05/09/18  Yes [provider]    Physical Exam: Vitals:   08/06/18 2145 08/06/18 2200 08/06/18 2215 08/06/18 2311  BP: 111/63 122/72 118/70 112/62  Pulse: 81 84 81 84  Resp: (!) 22 20 18 14   Temp:    98.2 F (36.8 C)  TempSrc:    Oral  SpO2: 100% 100% 100% 100%  Weight:      Height:        Constitutional: Elderly man resting supine in bed, NAD, calm, comfortable Eyes: PERRL, lids and conjunctivae normal ENMT: Mucous membranes are moist. Posterior pharynx clear of any exudate or lesions.Normal dentition.  Neck: normal, supple, no masses. Respiratory: Bibasilar inspiratory crackles. Normal respiratory effort. No accessory muscle use.  Cardiovascular: Regular rate and rhythm, no murmurs / rubs / gallops.  Trace lower extremity edema. 2+ pedal pulses.  LUE aVF with palpable thrill. Abdomen: no tenderness, no masses palpated. No hepatosplenomegaly. Bowel sounds positive.  Musculoskeletal: no clubbing / cyanosis. No joint deformity upper and  lower extremities. Good ROM, no contractures. Normal muscle tone.  Skin: no rashes, lesions, ulcers. No induration Neurologic: CN 2-12 grossly intact. Sensation intact,Strength 5/5 in all 4.  Psychiatric: Normal judgment and insight. Alert and oriented x 3. Normal mood.     Labs on Admission: I have personally reviewed following labs and imaging studies  CBC: Recent Labs  Lab 08/06/18 1854  WBC 9.3  NEUTROABS 7.3  HGB 7.7*  HCT 23.4*  MCV 98.7  PLT 185   Basic Metabolic Panel: Recent Labs  Lab 08/06/18 1854  NA 128*  K 4.0  CL 86*  CO2 23  GLUCOSE 208*  BUN 55*  CREATININE 9.43*  CALCIUM 8.4*   GFR: Estimated Creatinine Clearance: 5.5 mL/min (A) (by C-G formula based on SCr of 9.43 mg/dL (H)). Liver Function Tests: Recent Labs  Lab 08/06/18 1854  AST 32  ALT 27  ALKPHOS 70  BILITOT 0.7  PROT 7.7  ALBUMIN 3.5   No results for input(s): LIPASE, AMYLASE in the last 168 hours. No results  for input(s): AMMONIA in the last 168 hours. Coagulation Profile: No results for input(s): INR, PROTIME in the last 168 hours. Cardiac Enzymes: No results for input(s): CKTOTAL, CKMB, CKMBINDEX, TROPONINI in the last 168 hours. BNP (last 3 results) No results for input(s): PROBNP in the last 8760 hours. HbA1C: No results for input(s): HGBA1C in the last 72 hours. CBG: No results for input(s): GLUCAP in the last 168 hours. Lipid Profile: Recent Labs    08/06/18 1854  CHOL 202*  HDL 68  LDLCALC NOT CALCULATED  TRIG 200*  CHOLHDL 3.0   Thyroid Function Tests: No results for input(s): TSH, T4TOTAL, FREET4, T3FREE, THYROIDAB in the last 72 hours. Anemia Panel: No results for input(s): VITAMINB12, FOLATE, FERRITIN, TIBC, IRON, RETICCTPCT in the last 72 hours. Urine analysis: No results found for: COLORURINE, APPEARANCEUR, LABSPEC, [place], GLUCOSEU, HGBUR, BILIRUBINUR, KETONESUR, PROTEINUR, UROBILINOGEN, NITRITE, LEUKOCYTESUR  Radiological Exams on Admission: Dg Chest  Port 1 View  Result Date: 08/06/2018 CLINICAL DATA:  Chest pain EXAM: PORTABLE CHEST 1 VIEW COMPARISON:  02/21/2017 FINDINGS: Mild cardiomegaly with vascular congestion. No confluent opacities or effusions. Mild hyperinflation of the lungs. No overt edema, confluent opacities or effusions. No acute bony abnormality. IMPRESSION: Borderline cardiomegaly.  Mild vascular congestion. Hyperinflation. Electronically Signed   By: Rolm Baptise M.D.   On: 08/06/2018 19:30    EKG: Independently reviewed. Sinus tachycardia, ST elevation V1, ST depression V3-V6.  ST changes are new when compared to prior from 2014.  Assessment/Plan Principal Problem:   NSTEMI (non-ST elevated myocardial infarction) ([place]) Active Problems:   Diabetes mellitus ([place])   Hypertension   Hypercholesteremia   ESRD (end stage renal disease) on dialysis ([place])   Anemia of chronic disease  Caylan Malter is a 85 y.o. male with medical history significant for CAD with chronic stable angina and history of end STEMI, ESRD on TTS HD, insulin-dependent type 2 diabetes, hypertension, hyperlipidemia, and anemia of chronic disease who is admitted with an NSTEMI.   NSTEMI/history of CAD with angina: Not considered cardiac cath candidate per cardiology.  Recommending medical management only. -Admit to PCU -Continue IV heparin -Continue aspirin 325 mg daily -Start Lopressor 12.5 mg twice daily as HR and BP tolerate, adjust as needed -Sublingual nitroglycerin as needed -Troponin I 242 >> repeat pending -Obtain echocardiogram -Continue atorvastatin 40 mg daily  ESRD on TTS HD: Reports adherence to dialysis.  Mildly hypovolemic on admission. -Will need nephrology consult in a.m. for usual dialysis  Anemia of chronic disease: Hemoglobin 7.7, no recent for comparison.  Has no active bleeding, however on aspirin and IV heparin will need to monitor closely.  Given cardiac history, recommend blood transfusion with dialysis for goal hemoglobin  8.0.  Insulin-dependent type 2 diabetes: Takes Novolin 70/30 20 units twice daily as needed at home. -Sensitive SSI w/ HS coverage for now, adjust as needed -Check A1c  Hypertension: Currently controlled.  Start Lopressor 12.5 mg twice daily his HR and BP tolerate as above.  Hyperlipidemia: Continue atorvastatin 40 mg daily.  DVT prophylaxis: Heparin anticoagulation Code Status: Full code, confirmed with patient Family Communication: None present on admission Disposition Plan: Pending clinical progress Consults called: EDP discussed with cardiology Admission status: Admit - It is my clinical opinion that admission to INPATIENT is reasonable and necessary because of the expectation that this patient will require hospital care that crosses at least 2 midnights to treat this condition based on the medical complexity of the problems presented.  Given the aforementioned information, the predictability of  an adverse outcome is felt to be significant.       Zada Finders MD [facility]  If 7PM-7AM, please contact night-coverage [email or web address]  08/06/2018, 11:44 PM

## 2018-08-06 NOTE — ED Triage Notes (Signed)
Chest pain this am. Pain comes and goes with radiation into his back. States he is being treated for same at [facility].

## 2018-08-06 NOTE — Progress Notes (Signed)
ANTICOAGULATION CONSULT NOTE - Initial Consult  Pharmacy Consult for heparin Indication: chest pain/ACS  No Known Allergies  Patient Measurements: Height: 5\' 8"  (172.7 cm) Weight: 157 lb (71.2 kg) IBW/kg (Calculated) : 68.4 Heparin Dosing Weight: 71.2  Vital Signs: Temp: 98.5 F (36.9 C) (07/13 1819) Temp Source: Oral (07/13 1819) BP: 120/63 (07/13 1900) Pulse Rate: 98 (07/13 1900)  Labs: Recent Labs    08/06/18 1854  HGB 7.7*  HCT 23.4*  PLT 209  CREATININE 9.43*  TROPONINIHS 242*    Estimated Creatinine Clearance: 5.5 mL/min (A) (by C-G formula based on SCr of 9.43 mg/dL (H)).   Medical History: Past Medical History:  Diagnosis Date  . Arthritis   . Chronic kidney disease   . Diabetes mellitus ([place])   . Dialysis patient ([place])   . GERD (gastroesophageal reflux disease)    occ  . Heart murmur   . Hypercholesteremia   . Hypertension   . Psoriasis     Medications:  Infusions:  . sodium chloride    . heparin      Assessment: Pt is a 87YOM that presented to [facility] w/ c/o chest pain this am. Pt states that the pain comes and goes with radiation to his back. Hgb 7.7 and Plts 209. Pharmacy consulted to dose heparin per ACS. Code STEMI called and cancelled, cardiology to see patient. Pt arrived having already received heparin bolus and started on heparin drip at [facility].  Goal of Therapy:  Heparin level 0.3-0.7 units/ml Monitor platelets by anticoagulation protocol: Yes   Plan:  Heparin 850 units/hr IV Check 8 hour heparin level Monitor heparin level and CBC daily F/u cardiology plans  [facility] 08/06/2018,7:40 PM

## 2018-08-06 NOTE — ED Notes (Signed)
Spoke to wife Nicholas [name], [phone], and notified of transfer to [facility] ED and possible admission.  She related verbal understanding.

## 2018-08-06 NOTE — ED Notes (Signed)
Per Charge Nurse Marva, lab reported hemolyzed PTT/INR. Will not redraw due to administration of Heparin bolus and drip per provider. Provider aware.

## 2018-08-06 NOTE — ED Notes (Signed)
Called [facility] spoke with Tammie

## 2018-08-06 NOTE — ED Notes (Signed)
Pt resting quietly at this time.  Pt denies any chest pain or discomfort.

## 2018-08-06 NOTE — ED Notes (Signed)
Paged Dr. Meda Coffee

## 2018-08-07 ENCOUNTER — Inpatient Hospital Stay (HOSPITAL_COMMUNITY): Payer: Medicare Other

## 2018-08-07 DIAGNOSIS — I361 Nonrheumatic tricuspid (valve) insufficiency: Secondary | ICD-10-CM

## 2018-08-07 DIAGNOSIS — I340 Nonrheumatic mitral (valve) insufficiency: Secondary | ICD-10-CM

## 2018-08-07 DIAGNOSIS — I10 Essential (primary) hypertension: Secondary | ICD-10-CM

## 2018-08-07 LAB — CBC
HCT: 20.6 % — ABNORMAL LOW (ref 39.0–52.0)
Hemoglobin: 7.2 g/dL — ABNORMAL LOW (ref 13.0–17.0)
MCH: 33 pg (ref 26.0–34.0)
MCHC: 35 g/dL (ref 30.0–36.0)
MCV: 94.5 fL (ref 80.0–100.0)
Platelets: 204 10*3/uL (ref 150–400)
RBC: 2.18 MIL/uL — ABNORMAL LOW (ref 4.22–5.81)
RDW: 12.5 % (ref 11.5–15.5)
WBC: 7.8 10*3/uL (ref 4.0–10.5)
nRBC: 0 % (ref 0.0–0.2)

## 2018-08-07 LAB — HEPARIN LEVEL (UNFRACTIONATED)
Heparin Unfractionated: 0.36 IU/mL (ref 0.30–0.70)
Heparin Unfractionated: 0.38 IU/mL (ref 0.30–0.70)

## 2018-08-07 LAB — HEMOGLOBIN A1C
Hgb A1c MFr Bld: 6.8 % — ABNORMAL HIGH (ref 4.8–5.6)
Mean Plasma Glucose: 148.46 mg/dL

## 2018-08-07 LAB — BASIC METABOLIC PANEL
Anion gap: 15 (ref 5–15)
BUN: 57 mg/dL — ABNORMAL HIGH (ref 8–23)
CO2: 25 mmol/L (ref 22–32)
Calcium: 8.3 mg/dL — ABNORMAL LOW (ref 8.9–10.3)
Chloride: 90 mmol/L — ABNORMAL LOW (ref 98–111)
Creatinine, Ser: 10.03 mg/dL — ABNORMAL HIGH (ref 0.61–1.24)
GFR calc Af Amer: 5 mL/min — ABNORMAL LOW (ref >60)
GFR calc non Af Amer: 4 mL/min — ABNORMAL LOW (ref >60)
Glucose, Bld: 113 mg/dL — ABNORMAL HIGH (ref 70–99)
Potassium: 4.2 mmol/L (ref 3.5–5.1)
Sodium: 130 mmol/L — ABNORMAL LOW (ref 135–145)

## 2018-08-07 LAB — TROPONIN I (HIGH SENSITIVITY)
Troponin I (High Sensitivity): 7197 ng/L (ref <18)
Troponin I (High Sensitivity): 22363 ng/L (ref <18)

## 2018-08-07 LAB — ECHOCARDIOGRAM COMPLETE
Height: 68 in
Weight: 2476.21 oz

## 2018-08-07 LAB — ABO/RH: ABO/RH(D): A NEG

## 2018-08-07 LAB — GLUCOSE, CAPILLARY
Glucose-Capillary: 102 mg/dL — ABNORMAL HIGH (ref 70–99)
Glucose-Capillary: 122 mg/dL — ABNORMAL HIGH (ref 70–99)
Glucose-Capillary: 125 mg/dL — ABNORMAL HIGH (ref 70–99)
Glucose-Capillary: 128 mg/dL — ABNORMAL HIGH (ref 70–99)

## 2018-08-07 LAB — PREPARE RBC (CROSSMATCH)

## 2018-08-07 LAB — MRSA PCR SCREENING: MRSA by PCR: NEGATIVE

## 2018-08-07 MED ORDER — PENTAFLUOROPROP-TETRAFLUOROETH EX AERO: 1.0000 "application " | INHALATION_SPRAY | CUTANEOUS | Status: DC | PRN

## 2018-08-07 MED ORDER — SEVELAMER CARBONATE 800 MG PO TABS
1600.0000 mg | ORAL_TABLET | Freq: Three times a day (TID) | ORAL | Status: DC
  Administered 2018-08-08 – 2018-08-10 (×3): 1600 mg via ORAL
  Filled 2018-08-07 (×3): qty 2

## 2018-08-07 MED ORDER — INSULIN ASPART 100 UNIT/ML ~~LOC~~ SOLN: 0.0000 [IU] | Freq: Every day | SUBCUTANEOUS | Status: DC

## 2018-08-07 MED ORDER — HEPARIN SODIUM (PORCINE) 1000 UNIT/ML DIALYSIS
1000.0000 [IU] | INTRAMUSCULAR | Status: DC | PRN
  Filled 2018-08-07: qty 1

## 2018-08-07 MED ORDER — ASPIRIN 81 MG PO CHEW
81.0000 mg | CHEWABLE_TABLET | ORAL | Status: AC
  Administered 2018-08-08: 81 mg via ORAL
  Filled 2018-08-07: qty 1

## 2018-08-07 MED ORDER — SODIUM CHLORIDE 0.9% FLUSH: 3.0000 mL | Freq: Two times a day (BID) | INTRAVENOUS | Status: DC

## 2018-08-07 MED ORDER — LIDOCAINE-PRILOCAINE 2.5-2.5 % EX CREA
1.0000 "application " | TOPICAL_CREAM | CUTANEOUS | Status: DC | PRN
  Filled 2018-08-07: qty 5

## 2018-08-07 MED ORDER — INSULIN ASPART 100 UNIT/ML ~~LOC~~ SOLN
0.0000 [IU] | Freq: Three times a day (TID) | SUBCUTANEOUS | Status: DC
  Administered 2018-08-07 – 2018-08-09 (×3): 1 [IU] via SUBCUTANEOUS

## 2018-08-07 MED ORDER — SODIUM CHLORIDE 0.9 % IV SOLN: INTRAVENOUS | Status: DC

## 2018-08-07 MED ORDER — DARBEPOETIN ALFA 150 MCG/0.3ML IJ SOSY
150.0000 ug | PREFILLED_SYRINGE | INTRAMUSCULAR | Status: DC
  Administered 2018-08-08: 150 ug via INTRAVENOUS
  Filled 2018-08-07: qty 0.3

## 2018-08-07 MED ORDER — SODIUM CHLORIDE 0.9 % IV SOLN: 100.0000 mL | INTRAVENOUS | Status: DC | PRN

## 2018-08-07 MED ORDER — LIDOCAINE HCL (PF) 1 % IJ SOLN: 5.0000 mL | INTRAMUSCULAR | Status: DC | PRN

## 2018-08-07 MED ORDER — ALTEPLASE 2 MG IJ SOLR: 2.0000 mg | Freq: Once | INTRAMUSCULAR | Status: DC | PRN

## 2018-08-07 MED ORDER — SODIUM CHLORIDE 0.9 % IV SOLN: 250.0000 mL | INTRAVENOUS | Status: DC | PRN

## 2018-08-07 MED ORDER — SODIUM CHLORIDE 0.9% IV SOLUTION
Freq: Once | INTRAVENOUS | Status: AC
  Administered 2018-08-07: 17:00:00 via INTRAVENOUS

## 2018-08-07 MED ORDER — CINACALCET HCL 30 MG PO TABS
30.0000 mg | ORAL_TABLET | Freq: Every day | ORAL | Status: DC
  Administered 2018-08-08: 30 mg via ORAL
  Filled 2018-08-07 (×4): qty 1

## 2018-08-07 MED ORDER — CHLORHEXIDINE GLUCONATE CLOTH 2 % EX PADS
6.0000 | MEDICATED_PAD | Freq: Every day | CUTANEOUS | Status: DC
  Administered 2018-08-07: 6 via TOPICAL

## 2018-08-07 MED ORDER — SODIUM CHLORIDE 0.9% FLUSH: 3.0000 mL | INTRAVENOUS | Status: DC | PRN

## 2018-08-07 NOTE — Progress Notes (Signed)
PROGRESS NOTE    Nicholas [name]  OXB:[id number] DOB: 06-29-1930 DOA: 08/06/2018 PCP: Carol Ada, MD    Brief Narrative:  85 year old male who presented with chest pain.  He does have significant past medical history for coronary disease, chronic stable angina, end-stage renal disease, type 2 diabetes mellitus, hypertension, dyslipidemia and anemia.  Patient reported intermittent episode of chest pressure, on his initial physical examination his blood pressure was 121/62, heart rate 100, respiratory rate 18, temperature 98.5, oxygen saturation 95%.  His lungs had bibasilar Rales, heart S1-S2 present rhythmic, abdomen soft, no lower extremity edema.  Sodium 128, potassium 4.0, chloride 86, bicarb 23, glucose 208, BUN 55, creatinine 9.5, troponins high sensitivity 242.  EKG 102 bpm, left axis deviation, normal intervals, sinus rhythm, ST segment depression V3 through V6, no T wave changes.   Patient was admitted to the hospital working diagnosis of non-ST elevation myocardial infarction.   Assessment & Plan:   Principal Problem:   NSTEMI (non-ST elevated myocardial infarction) ([place]) Active Problems:   Diabetes mellitus ([place])   Hypertension   Hypercholesteremia   ESRD (end stage renal disease) on dialysis ([place])   Anemia of chronic disease   1. NSETMI. This am with no further chest or back pain, his high sensitive troponin has elevated up to 7,197 from 242. His repeat ekg has improvement on st depression, but has inverted T waves septal leads. His echocardiogram has EF 35 to 40% with moderate hypokinesis of the left ventricular, mid-apical anterospetal, anterior segment and anterior wall and inferioapical, consistent with the LAD territory.   Will continue anticoagulation with IV heparin, continue asa, atorvastatin and metoprolol. He follows with [facility] cardiology. Will have inpatient formal cardiology consultation.   2. ESRD on HD. Patient euvolemic, his K is 4.2, HD scheduled is  T-Th-Sat. Nephrology has been consulted for HD while hospitalized.  3. HTN. Continue blood pressure control with metoprolol.  4. Dyslipidemia. Continue statin therapy.   5. T2Dm. Continue insulin sliding scale for glucose cover and monitoring.   DVT prophylaxis: IV heparin   Code Status: full Family Communication: no family at the bedside  Disposition Plan/ discharge barriers: pending clinical improvement   Body mass index is 23.53 kg/m. Malnutrition Type:      Malnutrition Characteristics:      Nutrition Interventions:     RN Pressure Injury Documentation:     Consultants:   Cardiology   Nephrology   Procedures:     Antimicrobials:       Subjective: Patient with no further chest pain this am, but at home did have more frequent and more intense than usual chest pain. He follows with [facility] cardiology.   Objective: Vitals:   08/06/18 2200 08/06/18 2215 08/06/18 2311 08/07/18 0620  BP: 122/72 118/70 112/62 115/62  Pulse: 84 81 84 78  Resp: 20 18 14 16   Temp:   98.2 F (36.8 C) 98 F (36.7 C)  TempSrc:   Oral Oral  SpO2: 100% 100% 100% 100%  Weight:    70.2 kg  Height:        Intake/Output Summary (Last 24 hours) at 08/07/2018 1501 Last data filed at 08/07/2018 0700 Gross per 24 hour  Intake 333.79 ml  Output -  Net 333.79 ml   Filed Weights   08/06/18 1815 08/07/18 0620  Weight: 71.2 kg 70.2 kg    Examination:   General: deconditioned  Neurology: Awake and alert, non focal  E ENT: mild pallor, no icterus, oral mucosa moist  Cardiovascular: No JVD. S1-S2 present, rhythmic, no gallops, rubs, or murmurs. No lower extremity edema. Pulmonary: positive breath sounds bilaterally, adequate air movement, no wheezing, rhonchi or rales. Gastrointestinal. Abdomen with no organomegaly, non tender, no rebound or guarding Skin. No rashes Musculoskeletal: no joint deformities     Data Reviewed: I have personally reviewed following labs and  imaging studies  CBC: Recent Labs  Lab 08/06/18 1854 08/07/18 0238  WBC 9.3 7.8  NEUTROABS 7.3  --   HGB 7.7* 7.2*  HCT 23.4* 20.6*  MCV 98.7 94.5  PLT 209 188   Basic Metabolic Panel: Recent Labs  Lab 08/06/18 1854 08/07/18 0238  NA 128* 130*  K 4.0 4.2  CL 86* 90*  CO2 23 25  GLUCOSE 208* 113*  BUN 55* 57*  CREATININE 9.43* 10.03*  CALCIUM 8.4* 8.3*   GFR: Estimated Creatinine Clearance: 5.2 mL/min (A) (by C-G formula based on SCr of 10.03 mg/dL (H)). Liver Function Tests: Recent Labs  Lab 08/06/18 1854  AST 32  ALT 27  ALKPHOS 70  BILITOT 0.7  PROT 7.7  ALBUMIN 3.5   No results for input(s): LIPASE, AMYLASE in the last 168 hours. No results for input(s): AMMONIA in the last 168 hours. Coagulation Profile: No results for input(s): INR, PROTIME in the last 168 hours. Cardiac Enzymes: No results for input(s): CKTOTAL, CKMB, CKMBINDEX, TROPONINI in the last 168 hours. BNP (last 3 results) No results for input(s): PROBNP in the last 8760 hours. HbA1C: Recent Labs    08/07/18 0238  HGBA1C 6.8*   CBG: Recent Labs  Lab 08/07/18 0018 08/07/18 0727 08/07/18 1140  GLUCAP 125* 122* 128*   Lipid Profile: Recent Labs    08/06/18 1854  CHOL 202*  HDL 68  LDLCALC NOT CALCULATED  TRIG 200*  CHOLHDL 3.0   Thyroid Function Tests: No results for input(s): TSH, T4TOTAL, FREET4, T3FREE, THYROIDAB in the last 72 hours. Anemia Panel: No results for input(s): VITAMINB12, FOLATE, FERRITIN, TIBC, IRON, RETICCTPCT in the last 72 hours.    Radiology Studies: I have reviewed all of the imaging during this hospital visit personally     Scheduled Meds: . sodium chloride   Intravenous Once  . aspirin  325 mg Oral Daily  . atorvastatin  40 mg Oral QHS  . Chlorhexidine Gluconate Cloth  6 each Topical Q0600  . cinacalcet  30 mg Oral Q supper  . darbepoetin (ARANESP) injection - DIALYSIS  150 mcg Intravenous Q Tue-HD  . insulin aspart  0-5 Units Subcutaneous  QHS  . insulin aspart  0-9 Units Subcutaneous TID WC  . metoprolol tartrate  12.5 mg Oral BID  . sevelamer carbonate  1,600 mg Oral TID WC  . sodium chloride flush  3 mL Intravenous Q12H   Continuous Infusions: . sodium chloride    . sodium chloride    . sodium chloride    . heparin 850 Units/hr (08/06/18 1958)     LOS: 1 day        Nicholas [name] Gerome Apley, MD

## 2018-08-07 NOTE — Progress Notes (Signed)
[place] for heparin Indication: chest pain/ACS  Allergies  Allergen Reactions  . Hydrochlorothiazide Other (See Comments)    [facility]    Patient Measurements: Height: 5\' 8"  (172.7 cm) Weight: 154 lb 12.2 oz (70.2 kg) IBW/kg (Calculated) : 68.4 Heparin Dosing Weight: 71.2  Vital Signs: Temp: 98 F (36.7 C) (07/14 0620) Temp Source: Oral (07/14 0620) BP: 115/62 (07/14 0620) Pulse Rate: 78 (07/14 0620)  Labs: Recent Labs    08/06/18 1854 08/06/18 2338 08/07/18 0238 08/07/18 1023  HGB 7.7*  --  7.2*  --   HCT 23.4*  --  20.6*  --   PLT 209  --  204  --   HEPARINUNFRC  --   --  0.36 0.38  CREATININE 9.43*  --  10.03*  --   TROPONINIHS 242* 7,197*  --   --     Estimated Creatinine Clearance: 5.2 mL/min (A) (by C-G formula based on SCr of 10.03 mg/dL (H)).   Assessment: Pt is a 87YOM that presented to [facility] w/ c/o chest pain this am. Pt states that the pain comes and goes with radiation to his back. Hgb 7.2 and Plts 204. Pharmacy consulted to dose heparin per ACS. Heparin level remains therapeutic. No bleeding noted.   Goal of Therapy:  Heparin level 0.3-0.7 units/ml Monitor platelets by anticoagulation protocol: Yes   Plan:  Continue heparin 850 units/hr IV Monitor heparin level and CBC daily F/u cardiology plans  Salome Arnt, PharmD, BCPS Please see AMION for all pharmacy numbers 08/07/2018 11:14 AM

## 2018-08-07 NOTE — Consult Note (Addendum)
The patient has been seen in conjunction with Lyda Jester, PA-C. All aspects of care have been considered and discussed. The patient has been personally interviewed, examined, and all clinical data has been reviewed.   NSTEMI -> in high risk patient with multiple risk factors- Htn, elevated lipids, ESRD, and known CAD.  Needs cath to define anatomy and help guide therapy.  The patient was counseled to undergo left heart catheterization, coronary angiography, and possible percutaneous coronary intervention with stent implantation. The procedural risks and benefits were discussed in detail. The risks discussed included death, stroke, myocardial infarction, life-threatening bleeding, limb ischemia, kidney injury, allergy, and possible emergency cardiac surgery. The risk of these significant complications were estimated to occur less than 1% of the time. After discussion, the patient has agreed to proceed.    Cardiology Consultation:   Patient ID: Nicholas [name] MRN: [id number]; DOB: Oct 09, 1930  Admit date: 08/06/2018 Date of Consult: 08/07/2018  Primary Care Provider: Carol Ada, MD Primary Cardiologist: [facility] Primary Electrophysiologist:  None   Patient Profile:   Nicholas [name] is a 85 y.o. male with a hx of CAD with chronic stable angina, ESRD on HD TTS, IDT2DM, HTN, HLD and anemia of chronic disease who is being seen today for the evaluation of NSTEMI at the request of Dr. Cathlean Sauer, Internal Medicine.  History of Present Illness:   Nicholas [name] is followed by [place]. Per notes in Care Everywhere, he has a h/o nonobstructive CAD by cardiac cath years back and was treated medically. He had a stress test prior to undergoing rt nephrectomy for renal cell tumor at the [facility] hospital in 2018 that was normal. He started HD in Dec 2018 after surgery.  He has chronic anemia, w/ h/o angiodysplasia which was previously cauterized, IDDM, HTN, HLD.  Prior baseline EF unknown. No prior echo on  file.    Pt presented initially to [facility] on 7/13 w/ CC of chest pain. He reports h/o chronic stable angina, that typically resolves when he sits down to rest, however yesterday he had recurrent CP with increase in frequency of symptoms. Pain started yesterday morning while standing at the bathroom sink to shave. Described as substernal chest tightness radiating to his back. It eased off when he sat down but had recurrence multiple times throughout the day. Did not change after eating. No associated dyspnea, diaphoresis, n/v. Given recurrent CP, he went to med center [facility] and transferred to [facility] for NSTEMI.   Initial EKG 7/13 showed sinus tach 102 bpm with 3 mm ST depressions in V4-V6. Initial Hs Troponin was 242. 2nd troponin jumped to 7,197. Echo obtained today shows moderately reduced LVEF at 35-40% w/ moderate hypokinesis of the left ventricular, mid-apical anteroseptal wall, anterior segment, anterior wall and inferoapical area. Mild to moderate MR also noted. He was started on IV heparin for NSTEMI. BNP 662.8. Hgb 7.2. He has been having nosebleeds on heparin. Nephrology saw today and plans to give ESA and transfused with 1 unit during HD this evening.  Cardiology asked to weigh in. He is currently CP free and remains on IV heparin.    Heart Pathway Score:     Past Medical History:  Diagnosis Date   Arthritis    Chronic kidney disease    Diabetes mellitus ([place])    Dialysis patient ([place])    GERD (gastroesophageal reflux disease)    occ   Heart murmur    Hypercholesteremia    Hypertension    Psoriasis  Past Surgical History:  Procedure Laterality Date   CARDIAC CATHETERIZATION  09   "some blockage" with collaterals by cath at [facility] ~ 2009; no intervention required; reportedly, no routine cardiology f/u recommended    INGUINAL HERNIA REPAIR Right 04/13/2012   Procedure: HERNIA REPAIR INGUINAL INCARCERATED;  Surgeon: Gayland Curry, MD;  Location: [place];  Service: General;   Laterality: Right;   INSERTION OF MESH Right 04/13/2012   Procedure: INSERTION OF MESH;  Surgeon: Gayland Curry, MD;  Location: [place];  Service: General;  Laterality: Right;   VASECTOMY       Home Medications:  Prior to Admission medications   Medication Sig Start Date End Date Taking? Authorizing Provider  aspirin 325 MG tablet Take 325 mg by mouth daily.   Yes [provider]  atorvastatin (LIPITOR) 40 MG tablet Take 40 mg by mouth at bedtime. For cholesterol   Yes [provider]  cinacalcet (SENSIPAR) 30 MG tablet Take 30 mg by mouth daily.   Yes [provider]  insulin NPH-insulin regular (NOVOLIN 70/30) (70-30) 100 UNIT/ML injection Inject 20 Units into the skin 2 (two) times daily as needed (CBG >100).    Yes [provider]  metoprolol succinate (TOPROL-XL) 50 MG 24 hr tablet Take 25-50 mg by mouth See admin instructions. Take 1/2 tablet (25 mg) on dialysis days - Tuesday, Thursday, Saturday. Take 1 tablet (50 mg) on non-dialysis days - Sunday, Monday, Wednesday, Friday. Take with or immediately following a meal.   Yes [provider]  PROTEIN PO Take 237 mLs by mouth daily.   Yes [provider]  sevelamer carbonate (RENVELA) 800 MG tablet Take 1,600 mg by mouth 3 (three) times daily with meals.  05/09/18  Yes [provider]    Inpatient Medications: Scheduled Meds:  sodium chloride   Intravenous Once   aspirin  325 mg Oral Daily   atorvastatin  40 mg Oral QHS   Chlorhexidine Gluconate Cloth  6 each Topical Q0600   cinacalcet  30 mg Oral Q supper   darbepoetin (ARANESP) injection - DIALYSIS  150 mcg Intravenous Q Tue-HD   insulin aspart  0-5 Units Subcutaneous QHS   insulin aspart  0-9 Units Subcutaneous TID WC   metoprolol tartrate  12.5 mg Oral BID   sevelamer carbonate  1,600 mg Oral TID WC   sodium chloride flush  3 mL Intravenous Q12H   Continuous Infusions:  sodium chloride     sodium  chloride     sodium chloride     heparin 850 Units/hr (08/06/18 1958)   PRN Meds: sodium chloride, sodium chloride, acetaminophen **OR** acetaminophen, alteplase, heparin, lidocaine (PF), lidocaine-prilocaine, nitroGLYCERIN, ondansetron **OR** ondansetron (ZOFRAN) IV, pentafluoroprop-tetrafluoroeth  Allergies:    Allergies  Allergen Reactions   Hydrochlorothiazide Other (See Comments)    Unknown [facility]    Social History:   Social History   Socioeconomic History   Marital status: Married    Spouse name: Not on file   Number of children: Not on file   Years of education: Not on file   Highest education level: Not on file  Occupational History   Not on file  Social Needs   Financial resource strain: Not on file   Food insecurity    Worry: Not on file    Inability: Not on file   Transportation needs    Medical: Not on file    Non-medical: Not on file  Tobacco Use   Smoking status:  Former Smoker    Packs/day: 1.00    Years: 15.00    Pack years: 15.00    Types: Cigarettes    Quit date: 03/29/1987    Years since quitting: 31.3   Smokeless tobacco: Never Used  Substance and Sexual Activity   Alcohol use: No   Drug use: No   Sexual activity: Not on file  Lifestyle   Physical activity    Days per week: Not on file    Minutes per session: Not on file   Stress: Not on file  Relationships   Social connections    Talks on phone: Not on file    Gets together: Not on file    Attends religious service: Not on file    Active member of club or organization: Not on file    Attends meetings of clubs or organizations: Not on file    Relationship status: Not on file   Intimate partner violence    Fear of current or ex partner: Not on file    Emotionally abused: Not on file    Physically abused: Not on file    Forced sexual activity: Not on file  Other Topics Concern   Not on file  Social History Narrative   Not on file    Family History:     Family History  Problem Relation Age of Onset   Diabetes Mother    Stroke Father    Diabetes Sister    Diabetes Brother      ROS:  Please see the history of present illness.   All other ROS reviewed and negative.     Physical Exam/Data:   Vitals:   08/06/18 2200 08/06/18 2215 08/06/18 2311 08/07/18 0620  BP: 122/72 118/70 112/62 115/62  Pulse: 84 81 84 78  Resp: 20 18 14 16   Temp:   98.2 F (36.8 C) 98 F (36.7 C)  TempSrc:   Oral Oral  SpO2: 100% 100% 100% 100%  Weight:    70.2 kg  Height:        Intake/Output Summary (Last 24 hours) at 08/07/2018 1726 Last data filed at 08/07/2018 1700 Gross per 24 hour  Intake 418.79 ml  Output --  Net 418.79 ml   Last 3 Weights 08/07/2018 08/06/2018 10/03/2014  Weight (lbs) 154 lb 12.2 oz 157 lb 180 lb  Weight (kg) 70.2 kg 71.215 kg 81.647 kg     Body mass index is 23.53 kg/m.  General:  Well nourished, well developed, in no acute distress HEENT: normal Lymph: no adenopathy Neck: no JVD Endocrine:  No thryomegaly Vascular: No carotid bruits; FA pulses 2+ bilaterally without bruits  Cardiac:  normal S1, S2; RRR; no murmur  Lungs:  Faint crackles on the right lower lung field, otherwise CTA Abd: soft, nontender, no hepatomegaly  Ext: trace pretibial edema Musculoskeletal:  No deformities, BUE and BLE strength normal and equal Skin: warm and dry  Neuro:  CNs 2-12 intact, no focal abnormalities noted Psych:  Normal affect   EKG:  The EKG was personally reviewed and demonstrates:  Initial EKG 7/13 showed sinus tach 102 bpm with 3 mm ST depressions in V4-V6 Telemetry:  Telemetry was personally reviewed and demonstrates:  NSR 87 bpm   Relevant CV Studies: 2D Echo 08/07/18 IMPRESSIONS    1. The left ventricle has moderately reduced systolic function, with an ejection fraction of 35-40%. The cavity size was normal. There is mild asymmetric left ventricular hypertrophy of the basal anteroseptal wall. Left ventricular  diastolic Doppler  parameters are consistent with pseudonormalization. Elevated left atrial and left ventricular end-diastolic pressures The E/e' is >25.  2. Moderate hypokinesis of the left ventricular, mid-apical anteroseptal wall, anterior segment, anterior wall and inferoapical.  3. The right ventricle has normal systolic function. The cavity was normal. There is no increase in right ventricular wall thickness.  4. The mitral valve is abnormal. Mild thickening of the mitral valve leaflet. There is mild mitral annular calcification present. Mitral valve regurgitation is mild to moderate by color flow Doppler.  5. The tricuspid valve is abnormal.  6. The aortic valve is tricuspid. Mild calcification of the aortic valve. No stenosis of the aortic valve.  Laboratory Data:  High Sensitivity Troponin:   Recent Labs  Lab 08/06/18 1854 08/06/18 2338  TROPONINIHS 242* 7,197*     Cardiac EnzymesNo results for input(s): TROPONINI in the last 168 hours. No results for input(s): TROPIPOC in the last 168 hours.  Chemistry Recent Labs  Lab 08/06/18 1854 08/07/18 0238  NA 128* 130*  K 4.0 4.2  CL 86* 90*  CO2 23 25  GLUCOSE 208* 113*  BUN 55* 57*  CREATININE 9.43* 10.03*  CALCIUM 8.4* 8.3*  GFRNONAA 5* 4*  GFRAA 5* 5*  ANIONGAP 19* 15    Recent Labs  Lab 08/06/18 1854  PROT 7.7  ALBUMIN 3.5  AST 32  ALT 27  ALKPHOS 70  BILITOT 0.7   Hematology Recent Labs  Lab 08/06/18 1854 08/07/18 0238  WBC 9.3 7.8  RBC 2.37* 2.18*  HGB 7.7* 7.2*  HCT 23.4* 20.6*  MCV 98.7 94.5  MCH 32.5 33.0  MCHC 32.9 35.0  RDW 12.6 12.5  PLT 209 204   BNP Recent Labs  Lab 08/06/18 1854  BNP 662.8*    DDimer No results for input(s): DDIMER in the last 168 hours.   Radiology/Studies:  Dg Chest Port 1 View  Result Date: 08/06/2018 CLINICAL DATA:  Chest pain EXAM: PORTABLE CHEST 1 VIEW COMPARISON:  02/21/2017 FINDINGS: Mild cardiomegaly with vascular congestion. No confluent opacities or  effusions. Mild hyperinflation of the lungs. No overt edema, confluent opacities or effusions. No acute bony abnormality. IMPRESSION: Borderline cardiomegaly.  Mild vascular congestion. Hyperinflation. Electronically Signed   By: Rolm Baptise M.D.   On: 08/06/2018 19:30    Assessment and Plan:   Kedric Bumgarner is a 85 y.o. male with a hx of CAD with chronic stable angina, ESRD on HD TTS, IDT2DM, HTN, HLD and anemia of chronic disease who is being seen today for the evaluation of NSTEMI at the request of Dr. Cathlean Sauer, Internal Medicine.   1. NSTEMI: symptoms c/w unstable angina, w/ SSCP increasing in frequency. Troponin elevation, EKG and echocardiogram also c/w coronary ischemia/ ACS. Initial EKG showed 3 mm ST depressions in V4-V6, initial troponin 242 and 2nd troponin jumped to 7,197. 2D echo shows moderately reduced LVEF 35-40% with regional WMAs. He has been started on IV heparin and is currently CP free. Under normal circumstances, we would recommend cardiac catheterization however, given his chronic transfusion dependent anemia, he is not a suitable candidate for coronary stenting that would require treatment w/ DAPT. Would therefore favor medical therapy. IV heparin x 48 hrs, high dose statin and continuation of  blocker if BP allows. No ACE/ARB due to CKD. Continue to cycle Hs Trop to ensure downward trend. Aggressive management of cardiac risk factors. LDL goal < 70 mg/dL. Hgb A1c < 7.0. BP goal <130/80. Volume can be controlled through HD.  Will discuss case with Dr. Tamala Julian to see if he agrees with medical management. Other option would be to proceed with at least a diagnostic cath to ensure he does not have high grade LM disease. Dr. Tamala Julian to follow with final recommendations.   For questions or updates, please contact [place] Please consult [email or web address] for contact info under     Signed, Lyda Jester, PA-C  08/07/2018 5:26 PM

## 2018-08-07 NOTE — Progress Notes (Signed)
[place] for heparin Indication: chest pain/ACS  Allergies  Allergen Reactions  . Hydrochlorothiazide Other (See Comments)    Unknown [facility]    Patient Measurements: Height: 5\' 8"  (172.7 cm) Weight: 157 lb (71.2 kg) IBW/kg (Calculated) : 68.4 Heparin Dosing Weight: 71.2  Vital Signs: Temp: 98.2 F (36.8 C) (07/13 2311) Temp Source: Oral (07/13 2311) BP: 112/62 (07/13 2311) Pulse Rate: 84 (07/13 2311)  Labs: Recent Labs    08/06/18 1854 08/06/18 2338 08/07/18 0238  HGB 7.7*  --  7.2*  HCT 23.4*  --  20.6*  PLT 209  --  204  HEPARINUNFRC  --   --  0.36  CREATININE 9.43*  --   --   TROPONINIHS 242* 7,197*  --     Estimated Creatinine Clearance: 5.5 mL/min (A) (by C-G formula based on SCr of 9.43 mg/dL (H)).   Assessment: Pt is a 87YOM that [facility] w/ c/o chest pain this am. Pt states that the pain comes and goes with radiation to his back. Hgb 7.7 and Plts 209. Pharmacy consulted to dose heparin per ACS. Code STEMI called and cancelled, cardiology to see patient. Pt arrived having already received heparin bolus and started on heparin drip at [facility]. Initial heparin level 0.36 units/ml Goal of Therapy:  Heparin level 0.3-0.7 units/ml Monitor platelets by anticoagulation protocol: Yes   Plan:  Continue heparin 850 units/hr IV Monitor heparin level and CBC daily F/u cardiology plans  Thanks for allowing pharmacy to be a part of this patient's care.  Excell Seltzer, PharmD Clinical Pharmacist  08/07/2018,3:24 AM

## 2018-08-07 NOTE — Consult Note (Signed)
Reason for Consult: To manage dialysis and dialysis related needs Referring Physician: Arrien  Nicholas [name] is an 85 y.o. male with DM, HTN hyperlipidemia as well as a history of CAD and ESRD-  HD at [facility] on TTS.   Pt had been having CP which became more severe so he presented to [facility] yesterday.  Was found to have very positive troponins.  Cards was consulted and felt that patient was not a candidate for cath so suggested he get admitted for medical management of his CAD.  He is on heparin, states that he feels better.  We are asked to see him for dialysis needs.  He is compliant with his dialysis at the [place].  Last treatment was Saturday 7/11.  Hgb very low in the 7's-  He was not getting ESA at OP dialysis- unclear what his last hgb was    Dialyzes at [facility] 72.5. HD Bath 3K, Dialyzer unknown, Heparin none. Access left upper AVF-  450 BFR. No other meds with HD-  He tells me he is on renvela 2 with meals   Past Medical History:  Diagnosis Date  . Arthritis   . Chronic kidney disease   . Diabetes mellitus ([place])   . Dialysis patient ([place])   . GERD (gastroesophageal reflux disease)    occ  . Heart murmur   . Hypercholesteremia   . Hypertension   . Psoriasis     Past Surgical History:  Procedure Laterality Date  . CARDIAC CATHETERIZATION  09   "some blockage" with collaterals by cath at [facility] ~ 2009; no intervention required; reportedly, no routine cardiology f/u recommended   . INGUINAL HERNIA REPAIR Right 04/13/2012   Procedure: HERNIA REPAIR INGUINAL INCARCERATED;  Surgeon: Gayland Curry, MD;  Location: [place];  Service: General;  Laterality: Right;  . INSERTION OF MESH Right 04/13/2012   Procedure: INSERTION OF MESH;  Surgeon: Gayland Curry, MD;  Location: [place];  Service: General;  Laterality: Right;  Marland Kitchen VASECTOMY      Family History  Problem Relation Age of Onset  . Diabetes Mother   . Stroke Father   . Diabetes Sister   . Diabetes  Brother     Social History:  reports that he quit smoking about 31 years ago. His smoking use included cigarettes. He has a 15.00 pack-year smoking history. He has never used smokeless tobacco. He reports that he does not drink alcohol or use drugs.  Allergies:  Allergies  Allergen Reactions  . Hydrochlorothiazide Other (See Comments)    [facility]    Medications: I have reviewed the patient's current medications.   Results for orders placed or performed during the hospital encounter of 08/06/18 (from the past 48 hour(s))  Troponin I (High Sensitivity)     Status: Abnormal   Collection Time: 08/06/18  6:54 PM  Result Value Ref Range   Troponin I (High Sensitivity) 242 (HH) <18 ng/L    Comment: CRITICAL RESULT CALLED TO, READ BACK BY AND VERIFIED WITH: WALTON MEREDITH RN AT 1930 ON 08/06/18 BY [email or web address] (NOTE) Elevated high sensitivity troponin I (hsTnI) values and significant  changes across serial measurements may suggest ACS but many other  chronic and acute conditions are known to elevate hsTnI results.  Refer to the Links section for chest pain algorithms and additional  guidance. Performed at [facility], [address] [place], [place] [address]   Brain natriuretic peptide  Status: Abnormal   Collection Time: 08/06/18  6:54 PM  Result Value Ref Range   B Natriuretic Peptide 662.8 (H) 0.0 - 100.0 pg/mL    Comment: Performed at [facility], [address] [place], [place] [address]  CBC with Differential/Platelet     Status: Abnormal   Collection Time: 08/06/18  6:54 PM  Result Value Ref Range   WBC 9.3 4.0 - 10.5 K/uL   RBC 2.37 (L) 4.22 - 5.81 MIL/uL   Hemoglobin 7.7 (L) 13.0 - 17.0 g/dL   HCT 23.4 (L) 39.0 - 52.0 %   MCV 98.7 80.0 - 100.0 fL   MCH 32.5 26.0 - 34.0 pg   MCHC 32.9 30.0 - 36.0 g/dL   RDW 12.6 11.5 - 15.5 %   Platelets 209 150 - 400 K/uL   nRBC 0.0 0.0 - 0.2 %   Neutrophils Relative % 79 %   Neutro Abs 7.3 1.7  - 7.7 K/uL   Lymphocytes Relative 9 %   Lymphs Abs 0.9 0.7 - 4.0 K/uL   Monocytes Relative 10 %   Monocytes Absolute 1.0 0.1 - 1.0 K/uL   Eosinophils Relative 2 %   Eosinophils Absolute 0.2 0.0 - 0.5 K/uL   Basophils Relative 0 %   Basophils Absolute 0.0 0.0 - 0.1 K/uL   Immature Granulocytes 0 %   Abs Immature Granulocytes 0.03 0.00 - 0.07 K/uL    Comment: Performed at [facility], [address] [place], [place] [address]  Comprehensive metabolic panel     Status: Abnormal   Collection Time: 08/06/18  6:54 PM  Result Value Ref Range   Sodium 128 (L) 135 - 145 mmol/L   Potassium 4.0 3.5 - 5.1 mmol/L   Chloride 86 (L) 98 - 111 mmol/L   CO2 23 22 - 32 mmol/L   Glucose, Bld 208 (H) 70 - 99 mg/dL   BUN 55 (H) 8 - 23 mg/dL   Creatinine, Ser 9.43 (H) 0.61 - 1.24 mg/dL   Calcium 8.4 (L) 8.9 - 10.3 mg/dL   Total Protein 7.7 6.5 - 8.1 g/dL   Albumin 3.5 3.5 - 5.0 g/dL   AST 32 15 - 41 U/L   ALT 27 0 - 44 U/L   Alkaline Phosphatase 70 38 - 126 U/L   Total Bilirubin 0.7 0.3 - 1.2 mg/dL   GFR calc non Af Amer 5 (L) >60 mL/min   GFR calc Af Amer 5 (L) >60 mL/min   Anion gap 19 (H) 5 - 15    Comment: Performed at [facility], [address] [place], [place] [address]  Lipid panel     Status: Abnormal   Collection Time: 08/06/18  6:54 PM  Result Value Ref Range   Cholesterol 202 (H) 0 - 200 mg/dL    Comment: Performed at [facility], [place] [address] [place], [place] [address]   Triglycerides 200 (H) <150 mg/dL    Comment: Performed at [facility] [address] [place], [place] [address]   HDL 68 >40 mg/dL    Comment: Performed at [facility] [address] [place], [place] [address]   Total CHOL/HDL Ratio 3.0 RATIO    Comment: Performed at [facility], [place] [address] [place], [place] [address]   VLDL 40 0 - 40 mg/dL    Comment: Performed at [facility], [place] [address], [place], [place] [address]  LDL Cholesterol NOT  CALCULATED 0 - 99 mg/dL    Comment: Performed at [facility], [address] [place], [place] [address]  SARS Coronavirus 2 (Hosp order,Performed in [facility] lab via Abbott ID)     Status: None   Collection Time: 08/06/18  7:02 PM   Specimen: Dry Nasal Swab (Abbott ID Now)  Result Value Ref Range   SARS Coronavirus 2 (Abbott ID Now) NEGATIVE NEGATIVE    Comment: (NOTE) SARS-CoV-2 target nucleic acids are NOT DETECTED. The SARS-CoV-2 RNA is generally detectable in upper and lower respiratory specimens during the acute phase of infection.  Negativeresults do not preclude SARS-CoV-2 infection, do not rule out coinfections with other pathogens, and should not be used as the  sole basis for treatment or other patient management decisions.  Negative results must be combined with clinical observations, patient history, and epidemiological information. The expected result is Negative. Fact Sheet for Patients: GolfingFamily.no Fact Sheet for Healthcare Providers: https://www.hernandez-brewer.com/ This test is not yet approved or cleared by the [place] FDA and  has been authorized for detection and/or diagnosis of SARS-CoV-2 by FDA under an Emergency Use Authorization (EUA).  This EUA will remain in effect (meaning this test can be used) for the duration of  the COVID19 declaration under Section 5 64(b)(1) of the Act, 21 U.S.C.  section [phone] 3(b)(1), unless the authorization is terminated or revoked sooner. Performed at [facility], [address] [place], [place] [address]   Troponin I (High Sensitivity)     Status: Abnormal   Collection Time: 08/06/18 11:38 PM  Result Value Ref Range   Troponin I (High Sensitivity) 7,197 (HH) <18 ng/L    Comment: CRITICAL RESULT CALLED TO, READ BACK BY AND VERIFIED WITH: DEBRA DUFALL RN 08/07/2018 AT 0120 BY H SOEWARDIMAN MT (NOTE) Elevated high sensitivity troponin I (hsTnI) values  and significant  changes across serial measurements may suggest ACS but many other  chronic and acute conditions are known to elevate hsTnI results.  Refer to the Links section for chest pain algorithms and additional  guidance. Performed at [facility], [place] [address], [place], [place] [address]   Glucose, capillary     Status: Abnormal   Collection Time: 08/07/18 12:18 AM  Result Value Ref Range   Glucose-Capillary 125 (H) 70 - 99 mg/dL  MRSA PCR Screening     Status: None   Collection Time: 08/07/18 12:43 AM   Specimen: Nasal Mucosa; Nasopharyngeal  Result Value Ref Range   MRSA by PCR NEGATIVE NEGATIVE    Comment:        The GeneXpert MRSA Assay (FDA approved for NASAL specimens only), is one component of a comprehensive MRSA colonization surveillance program. It is not intended to diagnose MRSA infection nor to guide or monitor treatment for MRSA infections. Performed at [facility], [place] [address], [place], [place] [address]   Heparin level (unfractionated)     Status: None   Collection Time: 08/07/18  2:38 AM  Result Value Ref Range   Heparin Unfractionated 0.36 0.30 - 0.70 IU/mL    Comment: (NOTE) If heparin results are below expected values, and patient dosage has  been confirmed, suggest follow up testing of antithrombin III levels. Performed at [facility], [place] [address], [place], [place] [address]   CBC     Status: Abnormal   Collection Time: 08/07/18  2:38 AM  Result Value Ref Range  WBC 7.8 4.0 - 10.5 K/uL   RBC 2.18 (L) 4.22 - 5.81 MIL/uL   Hemoglobin 7.2 (L) 13.0 - 17.0 g/dL   HCT 20.6 (L) 39.0 - 52.0 %   MCV 94.5 80.0 - 100.0 fL   MCH 33.0 26.0 - 34.0 pg   MCHC 35.0 30.0 - 36.0 g/dL   RDW 12.5 11.5 - 15.5 %   Platelets 204 150 - 400 K/uL   nRBC 0.0 0.0 - 0.2 %    Comment: Performed at [facility], [place] [address] [place], [place] [address]  Basic metabolic panel     Status: Abnormal   Collection Time: 08/07/18  2:38  AM  Result Value Ref Range   Sodium 130 (L) 135 - 145 mmol/L   Potassium 4.2 3.5 - 5.1 mmol/L   Chloride 90 (L) 98 - 111 mmol/L   CO2 25 22 - 32 mmol/L   Glucose, Bld 113 (H) 70 - 99 mg/dL   BUN 57 (H) 8 - 23 mg/dL   Creatinine, Ser 10.03 (H) 0.61 - 1.24 mg/dL   Calcium 8.3 (L) 8.9 - 10.3 mg/dL   GFR calc non Af Amer 4 (L) >60 mL/min   GFR calc Af Amer 5 (L) >60 mL/min   Anion gap 15 5 - 15    Comment: Performed at [facility] [address], [place], [place] [address]  Hemoglobin A1c     Status: Abnormal   Collection Time: 08/07/18  2:38 AM  Result Value Ref Range   Hgb A1c MFr Bld 6.8 (H) 4.8 - 5.6 %    Comment: REPEATED TO VERIFY (NOTE) Pre diabetes:          5.7%-6.4% Diabetes:              >6.4% Glycemic control for   <7.0% adults with diabetes    Mean Plasma Glucose 148.46 mg/dL    Comment: Performed at [facility] [address], [place], [place] [address]  Glucose, capillary     Status: Abnormal   Collection Time: 08/07/18  7:27 AM  Result Value Ref Range   Glucose-Capillary 122 (H) 70 - 99 mg/dL    Dg Chest Port 1 View  Result Date: 08/06/2018 CLINICAL DATA:  Chest pain EXAM: PORTABLE CHEST 1 VIEW COMPARISON:  02/21/2017 FINDINGS: Mild cardiomegaly with vascular congestion. No confluent opacities or effusions. Mild hyperinflation of the lungs. No overt edema, confluent opacities or effusions. No acute bony abnormality. IMPRESSION: Borderline cardiomegaly.  Mild vascular congestion. Hyperinflation. Electronically Signed   By: Rolm Baptise M.D.   On: 08/06/2018 19:30    ROS: no complaints at present-  Previous CP Blood pressure 115/62, pulse 78, temperature 98 F (36.7 C), temperature source Oral, resp. rate 16, height 5\' 8"  (1.727 m), weight 70.2 kg, SpO2 100 %. General appearance: alert and no distress Resp: clear to auscultation bilaterally Cardio: regular rate and rhythm, S1, S2 normal, no murmur, click, rub or gallop GI: soft, non-tender; bowel  sounds normal; no masses,  no organomegaly Extremities: extremities normal, atraumatic, no cyanosis or edema right AVF- patent  Assessment/Plan: 85 year old BM with multiple medical problems including ESRD and CAD. He now presents with an NSTEMI- for medical management 1 NSTEMI-  Medical management including heparin- per primary  2 ESRD: normally TTS-  Would like to do today if possible to keep on schedule via AVF- have received home orders from the [place] 3 Hypertension: BP controlled , only on low  dose lopressor  4. Anemia of ESRD: was not on iron or ESA as OP-  Unclear if this hgb in the 7's is new-  Currently having a nosebleed on heparin.  Will give ESA , check iron and actually I would feel better if I gave him a unit of blood on HD today  5. Metabolic Bone Disease: continue home renvela and sensipar    Louis Meckel 08/07/2018, 10:35 AM

## 2018-08-07 NOTE — Progress Notes (Signed)
  Echocardiogram 2D Echocardiogram has been performed.  Nicholas [name] 08/07/2018, 11:48 AM

## 2018-08-07 NOTE — Plan of Care (Signed)
  Problem: Clinical Measurements: Goal: Respiratory complications will improve Outcome: Progressing Note: No s/s of respiratory complications noted.  Stable on 2L/[place]. Goal: Cardiovascular complication will be avoided Outcome: Progressing Note: No s/s of cardiovascular complication noted.  VSS.

## 2018-08-08 ENCOUNTER — Encounter (HOSPITAL_COMMUNITY): Payer: Self-pay | Admitting: Cardiovascular Disease

## 2018-08-08 ENCOUNTER — Inpatient Hospital Stay (HOSPITAL_COMMUNITY)
Admission: EM | Disposition: A | Discharge status: Home / Self Care | Payer: Self-pay | Source: Home / Self Care | Attending: Internal Medicine

## 2018-08-08 DIAGNOSIS — E7800 Pure hypercholesterolemia, unspecified: Secondary | ICD-10-CM

## 2018-08-08 DIAGNOSIS — Z992 Dependence on renal dialysis: Secondary | ICD-10-CM

## 2018-08-08 DIAGNOSIS — D638 Anemia in other chronic diseases classified elsewhere: Secondary | ICD-10-CM

## 2018-08-08 DIAGNOSIS — N186 End stage renal disease: Secondary | ICD-10-CM

## 2018-08-08 DIAGNOSIS — I214 Non-ST elevation (NSTEMI) myocardial infarction: Principal | ICD-10-CM

## 2018-08-08 DIAGNOSIS — I2510 Atherosclerotic heart disease of native coronary artery without angina pectoris: Secondary | ICD-10-CM

## 2018-08-08 HISTORY — PX: LEFT HEART CATH AND CORONARY ANGIOGRAPHY: CATH118249

## 2018-08-08 LAB — CBC
HCT: 20.1 % — ABNORMAL LOW (ref 39.0–52.0)
HCT: 26.3 % — ABNORMAL LOW (ref 39.0–52.0)
Hemoglobin: 6.9 g/dL — CL (ref 13.0–17.0)
Hemoglobin: 9.2 g/dL — ABNORMAL LOW (ref 13.0–17.0)
MCH: 32.7 pg (ref 26.0–34.0)
MCH: 32.7 pg (ref 26.0–34.0)
MCHC: 34.3 g/dL (ref 30.0–36.0)
MCHC: 35 g/dL (ref 30.0–36.0)
MCV: 95.3 fL (ref 80.0–100.0)
MCV: 93.6 fL (ref 80.0–100.0)
Platelets: 222 10*3/uL (ref 150–400)
Platelets: 223 10*3/uL (ref 150–400)
RBC: 2.11 MIL/uL — ABNORMAL LOW (ref 4.22–5.81)
RBC: 2.81 MIL/uL — ABNORMAL LOW (ref 4.22–5.81)
RDW: 12.3 % (ref 11.5–15.5)
RDW: 12.5 % (ref 11.5–15.5)
WBC: 7.1 10*3/uL (ref 4.0–10.5)
WBC: 7.3 10*3/uL (ref 4.0–10.5)
nRBC: 0 % (ref 0.0–0.2)
nRBC: 0 % (ref 0.0–0.2)

## 2018-08-08 LAB — GLUCOSE, CAPILLARY
Glucose-Capillary: 98 mg/dL (ref 70–99)
Glucose-Capillary: 91 mg/dL (ref 70–99)
Glucose-Capillary: 95 mg/dL (ref 70–99)
Glucose-Capillary: 105 mg/dL — ABNORMAL HIGH (ref 70–99)
Glucose-Capillary: 100 mg/dL — ABNORMAL HIGH (ref 70–99)

## 2018-08-08 LAB — RENAL FUNCTION PANEL
Albumin: 2.7 g/dL — ABNORMAL LOW (ref 3.5–5.0)
Anion gap: 21 — ABNORMAL HIGH (ref 5–15)
BUN: 71 mg/dL — ABNORMAL HIGH (ref 8–23)
CO2: 19 mmol/L — ABNORMAL LOW (ref 22–32)
Calcium: 8.5 mg/dL — ABNORMAL LOW (ref 8.9–10.3)
Chloride: 91 mmol/L — ABNORMAL LOW (ref 98–111)
Creatinine, Ser: 11.75 mg/dL — ABNORMAL HIGH (ref 0.61–1.24)
GFR calc Af Amer: 4 mL/min — ABNORMAL LOW (ref >60)
GFR calc non Af Amer: 3 mL/min — ABNORMAL LOW (ref >60)
Glucose, Bld: 101 mg/dL — ABNORMAL HIGH (ref 70–99)
Phosphorus: 6.1 mg/dL — ABNORMAL HIGH (ref 2.5–4.6)
Potassium: 4.4 mmol/L (ref 3.5–5.1)
Sodium: 131 mmol/L — ABNORMAL LOW (ref 135–145)

## 2018-08-08 LAB — POCT ACTIVATED CLOTTING TIME: Activated Clotting Time: 142 seconds

## 2018-08-08 LAB — BASIC METABOLIC PANEL
Anion gap: 19 — ABNORMAL HIGH (ref 5–15)
BUN: 23 mg/dL (ref 8–23)
CO2: 24 mmol/L (ref 22–32)
Calcium: 8.9 mg/dL (ref 8.9–10.3)
Chloride: 89 mmol/L — ABNORMAL LOW (ref 98–111)
Creatinine, Ser: 5.4 mg/dL — ABNORMAL HIGH (ref 0.61–1.24)
GFR calc Af Amer: 10 mL/min — ABNORMAL LOW (ref >60)
GFR calc non Af Amer: 9 mL/min — ABNORMAL LOW (ref >60)
Glucose, Bld: 101 mg/dL — ABNORMAL HIGH (ref 70–99)
Potassium: 3.6 mmol/L (ref 3.5–5.1)
Sodium: 132 mmol/L — ABNORMAL LOW (ref 135–145)

## 2018-08-08 LAB — IRON AND TIBC
Iron: 102 ug/dL (ref 45–182)
Saturation Ratios: 39 % (ref 17.9–39.5)
TIBC: 262 ug/dL (ref 250–450)
UIBC: 160 ug/dL

## 2018-08-08 LAB — PROTIME-INR
INR: 1.1 (ref 0.8–1.2)
Prothrombin Time: 14.1 seconds (ref 11.4–15.2)

## 2018-08-08 LAB — HEPARIN LEVEL (UNFRACTIONATED): Heparin Unfractionated: 0.58 IU/mL (ref 0.30–0.70)

## 2018-08-08 LAB — HEPATITIS B SURFACE ANTIGEN: Hepatitis B Surface Ag: NEGATIVE

## 2018-08-08 LAB — FERRITIN: Ferritin: 2453 ng/mL — ABNORMAL HIGH (ref 24–336)

## 2018-08-08 SURGERY — LEFT HEART CATH AND CORONARY ANGIOGRAPHY
Anesthesia: LOCAL

## 2018-08-08 MED ORDER — CLOPIDOGREL BISULFATE 75 MG PO TABS
75.0000 mg | ORAL_TABLET | Freq: Every day | ORAL | Status: DC
  Administered 2018-08-09: 75 mg via ORAL
  Filled 2018-08-08: qty 1

## 2018-08-08 MED ORDER — HEPARIN SODIUM (PORCINE) 5000 UNIT/ML IJ SOLN
5000.0000 [IU] | Freq: Three times a day (TID) | INTRAMUSCULAR | Status: DC
  Administered 2018-08-09: 5000 [IU] via SUBCUTANEOUS
  Filled 2018-08-08: qty 1

## 2018-08-08 MED ORDER — MIDAZOLAM HCL 2 MG/2ML IJ SOLN
INTRAMUSCULAR | Status: AC
  Filled 2018-08-08: qty 2

## 2018-08-08 MED ORDER — SODIUM CHLORIDE 0.9 % IV SOLN: 250.0000 mL | INTRAVENOUS | Status: DC | PRN

## 2018-08-08 MED ORDER — SODIUM CHLORIDE 0.9% FLUSH: 3.0000 mL | INTRAVENOUS | Status: DC | PRN

## 2018-08-08 MED ORDER — HEPARIN (PORCINE) IN NACL 1000-0.9 UT/500ML-% IV SOLN
INTRAVENOUS | Status: DC | PRN
  Administered 2018-08-08: 500 mL

## 2018-08-08 MED ORDER — HEPARIN (PORCINE) IN NACL 1000-0.9 UT/500ML-% IV SOLN
INTRAVENOUS | Status: AC
  Filled 2018-08-08: qty 1000

## 2018-08-08 MED ORDER — SODIUM CHLORIDE 0.9% FLUSH
3.0000 mL | Freq: Two times a day (BID) | INTRAVENOUS | Status: DC
  Administered 2018-08-09: 3 mL via INTRAVENOUS

## 2018-08-08 MED ORDER — ATORVASTATIN CALCIUM 80 MG PO TABS
80.0000 mg | ORAL_TABLET | Freq: Every day | ORAL | Status: DC
  Administered 2018-08-08: 80 mg via ORAL
  Filled 2018-08-08: qty 1

## 2018-08-08 MED ORDER — LABETALOL HCL 5 MG/ML IV SOLN: 10.0000 mg | INTRAVENOUS | Status: AC | PRN

## 2018-08-08 MED ORDER — ONDANSETRON HCL 4 MG/2ML IJ SOLN: 4.0000 mg | Freq: Four times a day (QID) | INTRAMUSCULAR | Status: DC | PRN

## 2018-08-08 MED ORDER — HYDRALAZINE HCL 20 MG/ML IJ SOLN: 10.0000 mg | INTRAMUSCULAR | Status: AC | PRN

## 2018-08-08 MED ORDER — LIDOCAINE HCL (PF) 1 % IJ SOLN
INTRAMUSCULAR | Status: AC
  Filled 2018-08-08: qty 30

## 2018-08-08 MED ORDER — DARBEPOETIN ALFA 150 MCG/0.3ML IJ SOSY
PREFILLED_SYRINGE | INTRAMUSCULAR | Status: AC
  Filled 2018-08-08: qty 0.3

## 2018-08-08 MED ORDER — VERAPAMIL HCL 2.5 MG/ML IV SOLN
INTRAVENOUS | Status: AC
  Filled 2018-08-08: qty 2

## 2018-08-08 MED ORDER — IOHEXOL 350 MG/ML SOLN
INTRAVENOUS | Status: DC | PRN
  Administered 2018-08-08: 50 mL via INTRAVENOUS

## 2018-08-08 MED ORDER — CLOPIDOGREL BISULFATE 75 MG PO TABS
300.0000 mg | ORAL_TABLET | Freq: Once | ORAL | Status: AC
  Administered 2018-08-08: 300 mg via ORAL
  Filled 2018-08-08: qty 4

## 2018-08-08 MED ORDER — FENTANYL CITRATE (PF) 100 MCG/2ML IJ SOLN
INTRAMUSCULAR | Status: AC
  Filled 2018-08-08: qty 2

## 2018-08-08 MED ORDER — ACETAMINOPHEN 325 MG PO TABS: 650.0000 mg | ORAL_TABLET | ORAL | Status: DC | PRN

## 2018-08-08 MED ORDER — FENTANYL CITRATE (PF) 100 MCG/2ML IJ SOLN
INTRAMUSCULAR | Status: DC | PRN
  Administered 2018-08-08: 25 ug via INTRAVENOUS

## 2018-08-08 MED ORDER — ASPIRIN 81 MG PO CHEW: 81.0000 mg | CHEWABLE_TABLET | Freq: Every day | ORAL | Status: DC

## 2018-08-08 MED ORDER — MIDAZOLAM HCL 2 MG/2ML IJ SOLN
INTRAMUSCULAR | Status: DC | PRN
  Administered 2018-08-08: 1 mg via INTRAVENOUS

## 2018-08-08 MED ORDER — LIDOCAINE HCL (PF) 1 % IJ SOLN
INTRAMUSCULAR | Status: DC | PRN
  Administered 2018-08-08: 15 mL

## 2018-08-08 MED ORDER — ASPIRIN 81 MG PO CHEW
81.0000 mg | CHEWABLE_TABLET | ORAL | Status: AC
  Administered 2018-08-09: 06:00:00 81 mg via ORAL
  Filled 2018-08-08: qty 1

## 2018-08-08 MED ORDER — SODIUM CHLORIDE 0.9 % IV SOLN: INTRAVENOUS | Status: DC

## 2018-08-08 MED ORDER — CHLORHEXIDINE GLUCONATE CLOTH 2 % EX PADS
6.0000 | MEDICATED_PAD | Freq: Every day | CUTANEOUS | Status: DC
  Administered 2018-08-09 – 2018-08-10 (×2): 6 via TOPICAL

## 2018-08-08 SURGICAL SUPPLY — 10 items
BAG SNAP BAND KOVER 36X36 (MISCELLANEOUS) ×2 IMPLANT
CATH INFINITI 5FR JL5 (CATHETERS) ×2 IMPLANT
CATH INFINITI 5FR MULTPACK ANG (CATHETERS) ×2 IMPLANT
COVER DOME SNAP 22 D (MISCELLANEOUS) ×2 IMPLANT
KIT HEART LEFT (KITS) ×2 IMPLANT
PACK CARDIAC CATHETERIZATION (CUSTOM PROCEDURE TRAY) ×2 IMPLANT
SHEATH PINNACLE 5F 10CM (SHEATH) ×2 IMPLANT
TRANSDUCER W/STOPCOCK (MISCELLANEOUS) ×2 IMPLANT
TUBING CIL FLEX 10 FLL-RA (TUBING) IMPLANT
WIRE EMERALD 3MM-J .035X150CM (WIRE) ×2 IMPLANT

## 2018-08-08 NOTE — H&P (View-Only) (Signed)
Progress Note  Patient Name: Nicholas [name] Date of Encounter: 08/08/2018  Primary Cardiologist: No primary care provider on file.   Subjective   No problems over night. Cannot tell if he was dialyzed. Re-discussed cath.  Inpatient Medications    Scheduled Meds: . aspirin  325 mg Oral Daily  . atorvastatin  40 mg Oral QHS  . Chlorhexidine Gluconate Cloth  6 each Topical Q0600  . cinacalcet  30 mg Oral Q supper  . darbepoetin (ARANESP) injection - DIALYSIS  150 mcg Intravenous Q Tue-HD  . insulin aspart  0-5 Units Subcutaneous QHS  . insulin aspart  0-9 Units Subcutaneous TID WC  . metoprolol tartrate  12.5 mg Oral BID  . sevelamer carbonate  1,600 mg Oral TID WC  . sodium chloride flush  3 mL Intravenous Q12H  . sodium chloride flush  3 mL Intravenous Q12H   Continuous Infusions: . sodium chloride    . sodium chloride    . sodium chloride 10 mL/hr at 08/08/18 0617  . heparin 850 Units/hr (08/06/18 1958)   PRN Meds: sodium chloride, acetaminophen **OR** acetaminophen, nitroGLYCERIN, ondansetron **OR** ondansetron (ZOFRAN) IV, sodium chloride flush   Vital Signs    Vitals:   08/08/18 0320 08/08/18 0345 08/08/18 0415 08/08/18 0420  BP: 107/63 112/66 (!) 101/58 121/71  Pulse: 89 88 92 88  Resp: 18 18 19  (!) 22  Temp: 97.9 F (36.6 C)   97.6 F (36.4 C)  TempSrc: Oral   Oral  SpO2: 100%   100%  Weight:    73.1 kg  Height:        Intake/Output Summary (Last 24 hours) at 08/08/2018 0857 Last data filed at 08/08/2018 0420 Gross per 24 hour  Intake 144.36 ml  Output 2500 ml  Net -2355.64 ml   Filed Weights   08/07/18 0620 08/08/18 0045 08/08/18 0420  Weight: 70.2 kg 75.2 kg 73.1 kg    Telemetry    NSR - Personally Reviewed  ECG    Electrocardiogram performed on 08/06/2018 demonstrated significant anterolateral ST depression with poor R wave progression throughout the precordium.  Repeat EKG on 08/07/2018 reveals resolution of ST abnormality and symmetrical  precordial T wave inversion.- Personally Reviewed  Physical Exam  He appears younger than his stated age.  He moves slowly. GEN: No acute distress.   Neck: No JVD Cardiac: RRR, 2/6 right upper sternal systolic.  No rubs, or gallops.  Respiratory: Clear to auscultation bilaterally. GI: Soft, nontender, non-distended  MS: No edema; No deformity. Neuro:  Nonfocal  Psych: Normal affect   Labs    Chemistry Recent Labs  Lab 08/06/18 1854 08/07/18 0238 08/08/18 0005 08/08/18 0539  NA 128* 130* 131* 132*  K 4.0 4.2 4.4 3.6  CL 86* 90* 91* 89*  CO2 23 25 19* 24  GLUCOSE 208* 113* 101* 101*  BUN 55* 57* 71* 23  CREATININE 9.43* 10.03* 11.75* 5.40*  CALCIUM 8.4* 8.3* 8.5* 8.9  PROT 7.7  --   --   --   ALBUMIN 3.5  --  2.7*  --   AST 32  --   --   --   ALT 27  --   --   --   ALKPHOS 70  --   --   --   BILITOT 0.7  --   --   --   GFRNONAA 5* 4* 3* 9*  GFRAA 5* 5* 4* 10*  ANIONGAP 19* 15 21* 19*     Hematology Recent  Labs  Lab 08/07/18 0238 08/08/18 0005 08/08/18 0539  WBC 7.8 7.1 7.3  RBC 2.18* 2.11* 2.81*  HGB 7.2* 6.9* 9.2*  HCT 20.6* 20.1* 26.3*  MCV 94.5 95.3 93.6  MCH 33.0 32.7 32.7  MCHC 35.0 34.3 35.0  RDW 12.5 12.3 12.5  PLT 204 222 223    Cardiac EnzymesNo results for input(s): TROPONINI in the last 168 hours. No results for input(s): TROPIPOC in the last 168 hours.   BNP Recent Labs  Lab 08/06/18 1854  BNP 662.8*     DDimer No results for input(s): DDIMER in the last 168 hours.   Radiology    Dg Chest Port 1 View  Result Date: 08/06/2018 CLINICAL DATA:  Chest pain EXAM: PORTABLE CHEST 1 VIEW COMPARISON:  02/21/2017 FINDINGS: Mild cardiomegaly with vascular congestion. No confluent opacities or effusions. Mild hyperinflation of the lungs. No overt edema, confluent opacities or effusions. No acute bony abnormality. IMPRESSION: Borderline cardiomegaly.  Mild vascular congestion. Hyperinflation. Electronically Signed   By: Rolm Baptise M.D.   On:  08/06/2018 19:30    Cardiac Studies   2D Doppler echocardiogram performed on 08/07/2018  IMPRESSIONS    1. The left ventricle has moderately reduced systolic function, with an ejection fraction of 35-40%. The cavity size was normal. There is mild asymmetric left ventricular hypertrophy of the basal anteroseptal wall. Left ventricular diastolic Doppler  parameters are consistent with pseudonormalization. Elevated left atrial and left ventricular end-diastolic pressures The E/e' is >25.  2. Moderate hypokinesis of the left ventricular, mid-apical anteroseptal wall, anterior segment, anterior wall and inferoapical.  3. The right ventricle has normal systolic function. The cavity was normal. There is no increase in right ventricular wall thickness.  4. The mitral valve is abnormal. Mild thickening of the mitral valve leaflet. There is mild mitral annular calcification present. Mitral valve regurgitation is mild to moderate by color flow Doppler.  5. The tricuspid valve is abnormal.  6. The aortic valve is tricuspid. Mild calcification of the aortic valve. No stenosis of the aortic valve.  Patient Profile     85 y.o. male with a hx of CAD with chronic stable angina, ESRD on HD TTS, IDT2DM, HTN, HLD, anemia of chronic disease requiring transfusion admitted with NSTEMI, large enzyme leak and reduced LVEF-> 35%..  Assessment & Plan    1. Anterior non-ST elevation myocardial infarction: Significant wall motion abnormality on echocardiogram with LVEF 35%.  Need to determine if infarct is completed or if there is other significant disease to help establish long-term treatment strategy and prognosis.  Coronary angiography is recommended to define anatomy and prognosis.  Because of significant anemia and GI AV malformations, would consider PCI only under circumstances where prognosis would be significantly improved from the standpoint of LV function and myocardium at risk for recurrent event 2.  Coronary atherosclerosis: Longstanding prior history of atherosclerosis although never had interventions. 3. End-stage kidney disease on chronic hemodialysis: Cannot tell from documentation in the chart but based upon labs, it appears he had hemodialysis yesterday. 4. Acute on chronic combined systolic and diastolic heart failure: Guideline directed therapy will be limited due to hemodialysis/end-stage kidney disease. 5. Significant anemia requiring transfusion: Etiology felt anemia of chronic disease.  Has AV malformations in intestinal tract.  Will be high risk for bleeding on dual antiplatelet therapy.     For questions or updates, please contact [place] Please consult [email or web address] for contact info under Cardiology/STEMI.      Signed, [place],  MD  08/08/2018, 8:57 AM

## 2018-08-08 NOTE — Progress Notes (Addendum)
[facility] Progress Note   Subjective:  Seen and examined at bedside pre procedure.  Reports dialysis went well, some oozing from cannulation sites overnight.  Denies CP, SOB, n/v/d, edema, weakness and fatigue. I saw him after cath- dressing on AVF is dry-  Found 3 V dz- trying to determine treatment plan- no CP  Objective Vitals:   08/08/18 0320 08/08/18 0345 08/08/18 0415 08/08/18 0420  BP: 107/63 112/66 (!) 101/58 121/71  Pulse: 89 88 92 88  Resp: 18 18 19  (!) 22  Temp: 97.9 F (36.6 C)   97.6 F (36.4 C)  TempSrc: Oral   Oral  SpO2: 100%   100%  Weight:    73.1 kg  Height:       Physical Exam General:NAD, WDWN, male, laying in bed Heart:+tachycardia, RR, +5/6 systolic murmur Lungs:CTAB Abdomen:soft, NTND Extremities:no LE edema Dialysis Access: LU AVF dressed c/d/i   Filed Weights   08/07/18 0620 08/08/18 0045 08/08/18 0420  Weight: 70.2 kg 75.2 kg 73.1 kg    Intake/Output Summary (Last 24 hours) at 08/08/2018 0930 Last data filed at 08/08/2018 0420 Gross per 24 hour  Intake 144.36 ml  Output 2500 ml  Net -2355.64 ml    Additional Objective Labs: Basic Metabolic Panel: Recent Labs  Lab 08/07/18 0238 08/08/18 0005 08/08/18 0539  NA 130* 131* 132*  K 4.2 4.4 3.6  CL 90* 91* 89*  CO2 25 19* 24  GLUCOSE 113* 101* 101*  BUN 57* 71* 23  CREATININE 10.03* 11.75* 5.40*  CALCIUM 8.3* 8.5* 8.9  PHOS  --  6.1*  --    Liver Function Tests: Recent Labs  Lab 08/06/18 1854 08/08/18 0005  AST 32  --   ALT 27  --   ALKPHOS 70  --   BILITOT 0.7  --   PROT 7.7  --   ALBUMIN 3.5 2.7*   CBC: Recent Labs  Lab 08/06/18 1854 08/07/18 0238 08/08/18 0005 08/08/18 0539  WBC 9.3 7.8 7.1 7.3  NEUTROABS 7.3  --   --   --   HGB 7.7* 7.2* 6.9* 9.2*  HCT 23.4* 20.6* 20.1* 26.3*  MCV 98.7 94.5 95.3 93.6  PLT 209 204 222 223   CBG: Recent Labs  Lab 08/07/18 0018 08/07/18 0727 08/07/18 1140 08/07/18 1709 08/08/18 0758  GLUCAP 125* 122* 128*  102* 98   Iron Studies:  Recent Labs    08/08/18 0539  IRON 102  TIBC 262  FERRITIN 2,453*   Lab Results  Component Value Date   INR 1.1 08/08/2018   Studies/Results: Dg Chest Port 1 View  Result Date: 08/06/2018 CLINICAL DATA:  Chest pain EXAM: PORTABLE CHEST 1 VIEW COMPARISON:  02/21/2017 FINDINGS: Mild cardiomegaly with vascular congestion. No confluent opacities or effusions. Mild hyperinflation of the lungs. No overt edema, confluent opacities or effusions. No acute bony abnormality. IMPRESSION: Borderline cardiomegaly.  Mild vascular congestion. Hyperinflation. Electronically Signed   By: Rolm Baptise M.D.   On: 08/06/2018 19:30    Medications: . sodium chloride    . sodium chloride    . sodium chloride 10 mL/hr at 08/08/18 0617  . heparin Stopped (08/08/18 0903)   . [MAR Hold] aspirin  325 mg Oral Daily  . [MAR Hold] atorvastatin  40 mg Oral QHS  . [MAR Hold] Chlorhexidine Gluconate Cloth  6 each Topical Q0600  . [MAR Hold] cinacalcet  30 mg Oral Q supper  . [MAR Hold] darbepoetin (ARANESP) injection - DIALYSIS  150 mcg Intravenous  Q Tue-HD  . [MAR Hold] insulin aspart  0-5 Units Subcutaneous QHS  . [MAR Hold] insulin aspart  0-9 Units Subcutaneous TID WC  . [MAR Hold] metoprolol tartrate  12.5 mg Oral BID  . [MAR Hold] sevelamer carbonate  1,600 mg Oral TID WC  . [MAR Hold] sodium chloride flush  3 mL Intravenous Q12H  . [MAR Hold] sodium chloride flush  3 mL Intravenous Q12H    Dialysis Orders: Dialyzes at [facility] 72.5. HD Bath 3K, Dialyzer unknown, Heparin none. Access left upper AVF-  450 BFR. No other meds with HD-  He tells me he is on renvela 2 with meals    Assessment/Plan: 85 year old BM with multiple medical problems including ESRD and CAD. He now presents with an NSTEMI- for medical management  1. NSTEMI - Echo w/LVEF 35%. Heart cath this morning.  Per cards. 3V dz-  A few options-  Cards, cts and pt to decide.  Lopressor, asa and  lipitor with heparin drip  2. ESRD - on HD TTS.  HD yesterday per regular schedule. Tolerated well.  Some oozing post HD from AVF while on heparin drip, monitor. Orders written for HD tomorrow per regular schedule.  3. Anemia of CKD- Hgb 9.2 s/p 1unit pRBC? Ferritin^, no Fe load. Aranesp q Tuesday  4. Secondary hyperparathyroidism - Ca in goal, phos^.  Continue sensipar and renvela  5. HTN/volume - Bp well controlled.  Volume status stable.  Does not appear overloaded. UF 2.5 Lremoved yesterday.  6. Nutrition - Renal diet w/fluid restrictions once advanced. 7. Combined systolic and diastolic HD  Jen Mow, PA-C [facility] Pager: [phone] 08/08/2018,9:30 AM  LOS: 2 days   Patient seen and examined, agree with above note with above modifications. No more CP-- heart cath found severe 3 V dz- plan pending.  Routine HD tomorrow on schedule along with appropriate titration of HD related meds  Corliss Parish, MD 08/08/2018  ]

## 2018-08-08 NOTE — Progress Notes (Addendum)
Progress Note  Patient Name: Nicholas [name] Date of Encounter: 08/08/2018  Primary Cardiologist: No primary care provider on file.   Subjective   No problems over night. Cannot tell if he was dialyzed. Re-discussed cath.  Inpatient Medications    Scheduled Meds: . aspirin  325 mg Oral Daily  . atorvastatin  40 mg Oral QHS  . Chlorhexidine Gluconate Cloth  6 each Topical Q0600  . cinacalcet  30 mg Oral Q supper  . darbepoetin (ARANESP) injection - DIALYSIS  150 mcg Intravenous Q Tue-HD  . insulin aspart  0-5 Units Subcutaneous QHS  . insulin aspart  0-9 Units Subcutaneous TID WC  . metoprolol tartrate  12.5 mg Oral BID  . sevelamer carbonate  1,600 mg Oral TID WC  . sodium chloride flush  3 mL Intravenous Q12H  . sodium chloride flush  3 mL Intravenous Q12H   Continuous Infusions: . sodium chloride    . sodium chloride    . sodium chloride 10 mL/hr at 08/08/18 0617  . heparin 850 Units/hr (08/06/18 1958)   PRN Meds: sodium chloride, acetaminophen **OR** acetaminophen, nitroGLYCERIN, ondansetron **OR** ondansetron (ZOFRAN) IV, sodium chloride flush   Vital Signs    Vitals:   08/08/18 0320 08/08/18 0345 08/08/18 0415 08/08/18 0420  BP: 107/63 112/66 (!) 101/58 121/71  Pulse: 89 88 92 88  Resp: 18 18 19  (!) 22  Temp: 97.9 F (36.6 C)   97.6 F (36.4 C)  TempSrc: Oral   Oral  SpO2: 100%   100%  Weight:    73.1 kg  Height:        Intake/Output Summary (Last 24 hours) at 08/08/2018 0857 Last data filed at 08/08/2018 0420 Gross per 24 hour  Intake 144.36 ml  Output 2500 ml  Net -2355.64 ml   Filed Weights   08/07/18 0620 08/08/18 0045 08/08/18 0420  Weight: 70.2 kg 75.2 kg 73.1 kg    Telemetry    NSR - Personally Reviewed  ECG    Electrocardiogram performed on 08/06/2018 demonstrated significant anterolateral ST depression with poor R wave progression throughout the precordium.  Repeat EKG on 08/07/2018 reveals resolution of ST abnormality and symmetrical  precordial T wave inversion.- Personally Reviewed  Physical Exam  He appears younger than his stated age.  He moves slowly. GEN: No acute distress.   Neck: No JVD Cardiac: RRR, 2/6 left lower sternal systolic Murmur.  No rubs, or gallops.  Respiratory: Clear to auscultation bilaterally. GI: Soft, nontender, non-distended  MS: No edema; No deformity. Neuro:  Nonfocal  Psych: Normal affect   Labs    Chemistry Recent Labs  Lab 08/06/18 1854 08/07/18 0238 08/08/18 0005 08/08/18 0539  NA 128* 130* 131* 132*  K 4.0 4.2 4.4 3.6  CL 86* 90* 91* 89*  CO2 23 25 19* 24  GLUCOSE 208* 113* 101* 101*  BUN 55* 57* 71* 23  CREATININE 9.43* 10.03* 11.75* 5.40*  CALCIUM 8.4* 8.3* 8.5* 8.9  PROT 7.7  --   --   --   ALBUMIN 3.5  --  2.7*  --   AST 32  --   --   --   ALT 27  --   --   --   ALKPHOS 70  --   --   --   BILITOT 0.7  --   --   --   GFRNONAA 5* 4* 3* 9*  GFRAA 5* 5* 4* 10*  ANIONGAP 19* 15 21* 19*     Hematology  Recent Labs  Lab 08/07/18 0238 08/08/18 0005 08/08/18 0539  WBC 7.8 7.1 7.3  RBC 2.18* 2.11* 2.81*  HGB 7.2* 6.9* 9.2*  HCT 20.6* 20.1* 26.3*  MCV 94.5 95.3 93.6  MCH 33.0 32.7 32.7  MCHC 35.0 34.3 35.0  RDW 12.5 12.3 12.5  PLT 204 222 223    Cardiac EnzymesNo results for input(s): TROPONINI in the last 168 hours. No results for input(s): TROPIPOC in the last 168 hours.   BNP Recent Labs  Lab 08/06/18 1854  BNP 662.8*     DDimer No results for input(s): DDIMER in the last 168 hours.   Radiology    Dg Chest Port 1 View  Result Date: 08/06/2018 CLINICAL DATA:  Chest pain EXAM: PORTABLE CHEST 1 VIEW COMPARISON:  02/21/2017 FINDINGS: Mild cardiomegaly with vascular congestion. No confluent opacities or effusions. Mild hyperinflation of the lungs. No overt edema, confluent opacities or effusions. No acute bony abnormality. IMPRESSION: Borderline cardiomegaly.  Mild vascular congestion. Hyperinflation. Electronically Signed   By: Rolm Baptise M.D.    On: 08/06/2018 19:30    Cardiac Studies   2D Doppler echocardiogram performed on 08/07/2018  IMPRESSIONS    1. The left ventricle has moderately reduced systolic function, with an ejection fraction of 35-40%. The cavity size was normal. There is mild asymmetric left ventricular hypertrophy of the basal anteroseptal wall. Left ventricular diastolic Doppler  parameters are consistent with pseudonormalization. Elevated left atrial and left ventricular end-diastolic pressures The E/e' is >25.  2. Moderate hypokinesis of the left ventricular, mid-apical anteroseptal wall, anterior segment, anterior wall and inferoapical.  3. The right ventricle has normal systolic function. The cavity was normal. There is no increase in right ventricular wall thickness.  4. The mitral valve is abnormal. Mild thickening of the mitral valve leaflet. There is mild mitral annular calcification present. Mitral valve regurgitation is mild to moderate by color flow Doppler.  5. The tricuspid valve is abnormal.  6. The aortic valve is tricuspid. Mild calcification of the aortic valve. No stenosis of the aortic valve.  Patient Profile     85 y.o. male with a hx of CAD with chronic stable angina, ESRD on HD TTS, IDT2DM, HTN, HLD, anemia of chronic disease requiring transfusion admitted with NSTEMI, large enzyme leak and reduced LVEF-> 35%..  Assessment & Plan    1. Anterior non-ST elevation myocardial infarction: Significant wall motion abnormality on echocardiogram with LVEF 35%.  Need to determine if infarct is completed or if there is other significant disease to help establish long-term treatment strategy and prognosis.  Coronary angiography is recommended to define anatomy and prognosis.  Because of significant anemia and GI AV malformations, would consider PCI only under circumstances where prognosis would be significantly improved from the standpoint of LV function and myocardium at risk for recurrent event 2.  Coronary atherosclerosis: Longstanding prior history of atherosclerosis although never had interventions. 3. End-stage kidney disease on chronic hemodialysis: Cannot tell from documentation in the chart but based upon labs, it appears he had hemodialysis yesterday. 4. Acute on chronic combined systolic and diastolic heart failure: Guideline directed therapy will be limited due to hemodialysis/end-stage kidney disease. 5. Significant anemia requiring transfusion: Etiology felt anemia of chronic disease.  Has AV malformations in intestinal tract.  Will be high risk for bleeding on dual antiplatelet therapy.     For questions or updates, please contact [place] Please consult [email or web address] for contact info under Cardiology/STEMI.      Signed, Belva Crome  III, MD  08/08/2018, 8:57 AM

## 2018-08-08 NOTE — Progress Notes (Signed)
PROGRESS NOTE    Nicholas [name]  ZOX:[id number] DOB: 06-04-1930 DOA: 08/06/2018 PCP: Carol Ada, MD    Brief Narrative:  85 year old male who presented with chest pain.  He does have significant past medical history for coronary disease, chronic stable angina, end-stage renal disease, type 2 diabetes mellitus, hypertension, dyslipidemia and anemia.  Patient reported intermittent episode of chest pressure, on his initial physical examination his blood pressure was 121/62, heart rate 100, respiratory rate 18, temperature 98.5, oxygen saturation 95%.  His lungs had bibasilar Rales, heart S1-S2 present rhythmic, abdomen soft, no lower extremity edema.  Sodium 128, potassium 4.0, chloride 86, bicarb 23, glucose 208, BUN 55, creatinine 9.5, troponins high sensitivity 242.  EKG 102 bpm, left axis deviation, normal intervals, sinus rhythm, ST segment depression V3 through V6, no T wave changes.   Patient was admitted to the hospital working diagnosis of non-ST elevation myocardial infarction.   Assessment & Plan:   Principal Problem:   NSTEMI (non-ST elevated myocardial infarction) ([place]) Active Problems:   Diabetes mellitus ([place])   Hypertension   Hypercholesteremia   ESRD (end stage renal disease) on dialysis ([place])   Anemia of chronic disease    1. NSETMI/ anterior spetal.  EF 35 to 40% with moderate hypokinesis of the left ventricular, mid-apical anterospetal, anterior segment and anterior wall and inferioapical, consistent with the LAD territory. Patient with no chest pain, no dyspnea. Underwent cardiac catheterization today with findings of: severe multivessel CAD. Follow on surgical recommendations. Patient has risk factors including ESRD and advance age, but physically functional   Now off heparin infusion, continue b blockade, antiplatelet therapy (asa-clopidogrel) and statin therapy. No ace inh due to ESRD.Transfer to cardiac telemetry.   2. ESRD on HD. Scheduled for HD in am, per  nephrology recommendations, home schedule is T-Th-Sat.   3. HTN.  Metoprolol for blood pressure control.   4. Dyslipidemia. On  Atorvastatin 80 mg daily with good toleration.   5. T2Dm. On insulin sliding scale for glucose cover and monitoring. patient is tolerating po well  DVT prophylaxis: sq heparin   Code Status: full Family Communication: no family at the bedside  Disposition Plan/ discharge barriers: pending CT surgery and cardiology final recommendations.   Body mass index is 24.5 kg/m. Malnutrition Type:      Malnutrition Characteristics:      Nutrition Interventions:     RN Pressure Injury Documentation:     Consultants:   Cardiology   CT surgery   Procedures:   Cardiac cath today  Antimicrobials:       Subjective: Patient with no further chest pain, no nausea or vomiting. Tolerated HD well, no nausea or vomiting.   Objective: Vitals:   08/08/18 1123 08/08/18 1138 08/08/18 1153 08/08/18 1223  BP: (!) 105/50 121/64 (!) 100/58 (!) 108/54  Pulse:      Resp:      Temp:      TempSrc:      SpO2:      Weight:      Height:        Intake/Output Summary (Last 24 hours) at 08/08/2018 1345 Last data filed at 08/08/2018 0903 Gross per 24 hour  Intake 144.36 ml  Output 2500 ml  Net -2355.64 ml   Filed Weights   08/07/18 0620 08/08/18 0045 08/08/18 0420  Weight: 70.2 kg 75.2 kg 73.1 kg    Examination:   General: deconditioned  Neurology: Awake and alert, non focal  E ENT: mild pallor, no icterus, oral mucosa  moist Cardiovascular: No JVD. S1-S2 present, rhythmic, no gallops, rubs, or murmurs. No lower extremity edema. Pulmonary: positive breath sounds bilaterally, adequate air movement, no wheezing, rhonchi or rales. Gastrointestinal. Abdomen with no organomegaly, non tender, no rebound or guarding Skin. No rashes Musculoskeletal: no joint deformities     Data Reviewed: I have personally reviewed following labs and imaging studies   CBC: Recent Labs  Lab 08/06/18 1854 08/07/18 0238 08/08/18 0005 08/08/18 0539  WBC 9.3 7.8 7.1 7.3  NEUTROABS 7.3  --   --   --   HGB 7.7* 7.2* 6.9* 9.2*  HCT 23.4* 20.6* 20.1* 26.3*  MCV 98.7 94.5 95.3 93.6  PLT 209 204 222 283   Basic Metabolic Panel: Recent Labs  Lab 08/06/18 1854 08/07/18 0238 08/08/18 0005 08/08/18 0539  NA 128* 130* 131* 132*  K 4.0 4.2 4.4 3.6  CL 86* 90* 91* 89*  CO2 23 25 19* 24  GLUCOSE 208* 113* 101* 101*  BUN 55* 57* 71* 23  CREATININE 9.43* 10.03* 11.75* 5.40*  CALCIUM 8.4* 8.3* 8.5* 8.9  PHOS  --   --  6.1*  --    GFR: Estimated Creatinine Clearance: 9.7 mL/min (A) (by C-G formula based on SCr of 5.4 mg/dL (H)). Liver Function Tests: Recent Labs  Lab 08/06/18 1854 08/08/18 0005  AST 32  --   ALT 27  --   ALKPHOS 70  --   BILITOT 0.7  --   PROT 7.7  --   ALBUMIN 3.5 2.7*   No results for input(s): LIPASE, AMYLASE in the last 168 hours. No results for input(s): AMMONIA in the last 168 hours. Coagulation Profile: Recent Labs  Lab 08/08/18 0539  INR 1.1   Cardiac Enzymes: No results for input(s): CKTOTAL, CKMB, CKMBINDEX, TROPONINI in the last 168 hours. BNP (last 3 results) No results for input(s): PROBNP in the last 8760 hours. HbA1C: Recent Labs    08/07/18 0238  HGBA1C 6.8*   CBG: Recent Labs  Lab 08/07/18 1140 08/07/18 1709 08/08/18 0758 08/08/18 1019 08/08/18 1142  GLUCAP 128* 102* 98 91 95   Lipid Profile: Recent Labs    08/06/18 1854  CHOL 202*  HDL 68  LDLCALC NOT CALCULATED  TRIG 200*  CHOLHDL 3.0   Thyroid Function Tests: No results for input(s): TSH, T4TOTAL, FREET4, T3FREE, THYROIDAB in the last 72 hours. Anemia Panel: Recent Labs    08/08/18 0539  FERRITIN 2,453*  TIBC 262  IRON 102      Radiology Studies: I have reviewed all of the imaging during this hospital visit personally     Scheduled Meds: . [START ON 08/09/2018] aspirin  81 mg Oral Daily  . atorvastatin  80 mg Oral  q1800  . Chlorhexidine Gluconate Cloth  6 each Topical Q0600  . Chlorhexidine Gluconate Cloth  6 each Topical Q0600  . cinacalcet  30 mg Oral Q supper  . darbepoetin (ARANESP) injection - DIALYSIS  150 mcg Intravenous Q Tue-HD  . [START ON 08/09/2018] heparin  5,000 Units Subcutaneous Q8H  . insulin aspart  0-5 Units Subcutaneous QHS  . insulin aspart  0-9 Units Subcutaneous TID WC  . metoprolol tartrate  12.5 mg Oral BID  . sevelamer carbonate  1,600 mg Oral TID WC  . sodium chloride flush  3 mL Intravenous Q12H  . sodium chloride flush  3 mL Intravenous Q12H  . sodium chloride flush  3 mL Intravenous Q12H   Continuous Infusions: . sodium chloride    .  sodium chloride       LOS: 2 days        Autym Siess Gerome Apley, MD

## 2018-08-08 NOTE — Progress Notes (Signed)
   Angiographic findings reviewed and discussed with Dr. Claiborne Billings.  Has severe three-vessel coronary disease that includes a totally occluded dominant right coronary, total occlusion of obtuse marginals and high-grade circumflex, and has high-grade culprit and proximal LAD.  The anatomy is surgical although he is an extremely high risk, has aortic calcification, and will likely be turned down for surgical revascularization.  Plan heart team approach.  Surgical consultation will be obtained.  Discuss alternative treatment options with interventional team.  Anemia requiring transfusion on this admission, low EF, and end-stage kidney disease place him at high risk for future events and mortality.  Will discuss whether he is a reasonable candidate for protected LAD PCI.  Risk of bleeding on dual antiplatelet therapy will be high.  May ultimately be medical management due to futility.  Will discuss options with the patient and make a shared decision concerning proceeding with active management.

## 2018-08-08 NOTE — Plan of Care (Signed)
  Problem: Cardiovascular: Goal: Vascular access site(s) Level 0-1 will be maintained Outcome: Completed/Met

## 2018-08-08 NOTE — Progress Notes (Signed)
L upper arm site assessed after report of oozing at site. Bulky dressing noted. Dressing marked around 2 sites with small amount of blood. Left undisturbed. No need to remove dressing at this time. Will continue to monitor.

## 2018-08-08 NOTE — Progress Notes (Addendum)
   Long discussion with patient concerning treatment options.  He is not interested in CABG.  We discussed high risk PCI/OA and stenting. Discussed 10 fold increased risk of complications to include bleeding, stroke, death, MI, limb ischemia, transfusion, CHF among others. Would not be a candidate for bailout emergency surgery.  Medical therapy discussed and given 25-40 risk of recurrent MI/death over 6-12 months. Limited in therapeutic options due to low BP and dialysis.   Also concerned about chronically low hemoglobin in setting where DAPT will be required for up to 12 months. Will start plavix now.  Nephrology notified and will plan dialysis post procedure.

## 2018-08-08 NOTE — Interval H&P Note (Signed)
Cath Lab Visit (complete for each Cath Lab visit)  Clinical Evaluation Leading to the Procedure:   ACS: Yes.    Non-ACS:    Anginal Classification: CCS IV  Anti-ischemic medical therapy: Minimal Therapy (1 class of medications)  Non-Invasive Test Results: No non-invasive testing performed  Prior CABG: No previous CABG      History and Physical Interval Note:  08/08/2018 9:36 AM  Nicholas [name]  has presented today for surgery, with the diagnosis of NSTEMI.  The various methods of treatment have been discussed with the patient and family. After consideration of risks, benefits and other options for treatment, the patient has consented to  Procedure(s): LEFT HEART CATH AND CORONARY ANGIOGRAPHY (N/A) as a surgical intervention.  The patient's history has been reviewed, patient examined, no change in status, stable for surgery.  I have reviewed the patient's chart and labs.  Questions were answered to the patient's satisfaction.     Shelva Majestic

## 2018-08-08 NOTE — Progress Notes (Signed)
Progress Note  Patient Name: Nicholas [name] Date of Encounter: 08/08/2018  Primary Cardiologist: No primary care provider on file.   Subjective   No chest pain overnight.   Inpatient Medications    Scheduled Meds: . aspirin  325 mg Oral Daily  . atorvastatin  40 mg Oral QHS  . Chlorhexidine Gluconate Cloth  6 each Topical Q0600  . cinacalcet  30 mg Oral Q supper  . darbepoetin (ARANESP) injection - DIALYSIS  150 mcg Intravenous Q Tue-HD  . insulin aspart  0-5 Units Subcutaneous QHS  . insulin aspart  0-9 Units Subcutaneous TID WC  . metoprolol tartrate  12.5 mg Oral BID  . sevelamer carbonate  1,600 mg Oral TID WC  . sodium chloride flush  3 mL Intravenous Q12H  . sodium chloride flush  3 mL Intravenous Q12H   Continuous Infusions: . sodium chloride    . sodium chloride    . sodium chloride 10 mL/hr at 08/08/18 0617  . heparin 850 Units/hr (08/06/18 1958)   PRN Meds: sodium chloride, acetaminophen **OR** acetaminophen, nitroGLYCERIN, ondansetron **OR** ondansetron (ZOFRAN) IV, sodium chloride flush   Vital Signs    Vitals:   08/08/18 0320 08/08/18 0345 08/08/18 0415 08/08/18 0420  BP: 107/63 112/66 (!) 101/58 121/71  Pulse: 89 88 92 88  Resp: 18 18 19  (!) 22  Temp: 97.9 F (36.6 C)   97.6 F (36.4 C)  TempSrc: Oral   Oral  SpO2: 100%   100%  Weight:    73.1 kg  Height:        Intake/Output Summary (Last 24 hours) at 08/08/2018 0830 Last data filed at 08/08/2018 0420 Gross per 24 hour  Intake 144.36 ml  Output 2500 ml  Net -2355.64 ml   Last 3 Weights 08/08/2018 08/08/2018 08/07/2018  Weight (lbs) 161 lb 2.5 oz 165 lb 12.6 oz 154 lb 12.2 oz  Weight (kg) 73.1 kg 75.2 kg 70.2 kg      Telemetry    SR - Personally Reviewed  ECG    No new tracing this morning - Personally Reviewed  Physical Exam  Pleasant older AAM GEN: No acute distress.   Neck: No JVD Cardiac: RRR, no murmurs, rubs, or gallops.  Respiratory: Clear to auscultation bilaterally. GI:  Soft, nontender, non-distended  MS: No edema; No deformity. AVF to left upper arm Neuro:  Nonfocal  Psych: Normal affect   Labs    High Sensitivity Troponin:   Recent Labs  Lab 08/06/18 1854 08/06/18 2338 08/07/18 1801  TROPONINIHS 242* 7,197* 22,363*      Cardiac EnzymesNo results for input(s): TROPONINI in the last 168 hours. No results for input(s): TROPIPOC in the last 168 hours.   Chemistry Recent Labs  Lab 08/06/18 1854 08/07/18 0238 08/08/18 0005 08/08/18 0539  NA 128* 130* 131* 132*  K 4.0 4.2 4.4 3.6  CL 86* 90* 91* 89*  CO2 23 25 19* 24  GLUCOSE 208* 113* 101* 101*  BUN 55* 57* 71* 23  CREATININE 9.43* 10.03* 11.75* 5.40*  CALCIUM 8.4* 8.3* 8.5* 8.9  PROT 7.7  --   --   --   ALBUMIN 3.5  --  2.7*  --   AST 32  --   --   --   ALT 27  --   --   --   ALKPHOS 70  --   --   --   BILITOT 0.7  --   --   --   GFRNONAA 5* 4*  3* 9*  GFRAA 5* 5* 4* 10*  ANIONGAP 19* 15 21* 19*     Hematology Recent Labs  Lab 08/07/18 0238 08/08/18 0005 08/08/18 0539  WBC 7.8 7.1 7.3  RBC 2.18* 2.11* 2.81*  HGB 7.2* 6.9* 9.2*  HCT 20.6* 20.1* 26.3*  MCV 94.5 95.3 93.6  MCH 33.0 32.7 32.7  MCHC 35.0 34.3 35.0  RDW 12.5 12.3 12.5  PLT 204 222 223    BNP Recent Labs  Lab 08/06/18 1854  BNP 662.8*     DDimer No results for input(s): DDIMER in the last 168 hours.   Radiology    Dg Chest Port 1 View  Result Date: 08/06/2018 CLINICAL DATA:  Chest pain EXAM: PORTABLE CHEST 1 VIEW COMPARISON:  02/21/2017 FINDINGS: Mild cardiomegaly with vascular congestion. No confluent opacities or effusions. Mild hyperinflation of the lungs. No overt edema, confluent opacities or effusions. No acute bony abnormality. IMPRESSION: Borderline cardiomegaly.  Mild vascular congestion. Hyperinflation. Electronically Signed   By: Rolm Baptise M.D.   On: 08/06/2018 19:30    Cardiac Studies   Cath: 08/07/18  IMPRESSIONS    1. The left ventricle has moderately reduced systolic  function, with an ejection fraction of 35-40%. The cavity size was normal. There is mild asymmetric left ventricular hypertrophy of the basal anteroseptal wall. Left ventricular diastolic Doppler  parameters are consistent with pseudonormalization. Elevated left atrial and left ventricular end-diastolic pressures The E/e' is >25.  2. Moderate hypokinesis of the left ventricular, mid-apical anteroseptal wall, anterior segment, anterior wall and inferoapical.  3. The right ventricle has normal systolic function. The cavity was normal. There is no increase in right ventricular wall thickness.  4. The mitral valve is abnormal. Mild thickening of the mitral valve leaflet. There is mild mitral annular calcification present. Mitral valve regurgitation is mild to moderate by color flow Doppler.  5. The tricuspid valve is abnormal.  6. The aortic valve is tricuspid. Mild calcification of the aortic valve. No stenosis of the aortic valve.  Patient Profile     85 y.o. male with a hx of CAD with chronic stable angina, ESRD on HD TTS, IDT2DM, HTN, HLD and anemia of chronic disease who was seen for the evaluation of NSTEMI at the request of Dr. Cathlean Sauer, Internal Medicine.  Assessment & Plan    1. NSTEMI/abnormal EKG: hsT up to 22,363. EKG on admission with 57mm ST depression in v4-v6. Now improved on repeat EKG. Remains on IV heparin. No further episodes of chest pain. Current medical therapy includes ASA (full dose), statin. Plan today for diagnostic cath to define anatomy. Further recommendations will depend on findings.  2. Acute systolic HF/ICM?: Echo yesterday with EF of 35-40% with LVH of the anteroseptal wall. Would favor switching metoprolol tartrate to Toprol or coreg given new finding of HF as long as blood pressures can tolerate. No ACE/ARB with ESRD. Volume status managed by HD.   3. ESRD on HD: TTS, nephrology following. Had HD last night and tolerated well per his report  4. Chronic Anemia: Hgb  7.2-->> 9.2 post transfusion. Monitor H/H closely while on IV heparin. Did have issues with AVF bleeding this morning. Dressing changed by RN and dry currently.  5. IDDM: Hgb A1c 6.8.  -- on SSI  For questions or updates, please contact [place] Please consult [email or web address] for contact info under    Signed, Reino Bellis, NP  08/08/2018, 8:30 AM

## 2018-08-08 NOTE — Progress Notes (Signed)
Renal Navigator notified OP HD clinic/[facility] of patient's admission and negative COVID 19 test result in order to provide continuity of care.  Alphonzo Cruise, [place] Renal Navigator [phone]

## 2018-08-08 NOTE — H&P (View-Only) (Signed)
   Long discussion with patient concerning treatment options.  He is not interested in CABG.  We discussed high risk PCI/OA and stenting. Discussed 10 fold increased risk of complications to include bleeding, stroke, death, MI, limb ischemia, transfusion, CHF among others. Would not be a candidate for bailout emergency surgery.  Medical therapy discussed and given 25-40 risk of recurrent MI/death over 6-12 months. Limited in therapeutic options due to low BP and dialysis.   Also concerned about chronically low hemoglobin in setting where DAPT will be required for up to 12 months. Will start plavix now.  Nephrology notified and will plan dialysis post procedure.

## 2018-08-08 NOTE — Progress Notes (Signed)
     5 Fr. R F/A sheath was removed by [facility] RN, and manual pressure was held for 15 min. R groin was soft and non tender. Sterile gauze was applied at the site. Bed rest started at 1035 X 4 hr. Patient was given instructions about his bed rest.  HR 106 ST BP 135/62 sPO2 95% on R/A

## 2018-08-08 NOTE — Plan of Care (Signed)
  Problem: Pain Managment: Goal: General experience of comfort will improve Outcome: Progressing   Problem: Clinical Measurements: Goal: Respiratory complications will improve Outcome: Progressing Goal: Cardiovascular complication will be avoided Outcome: Progressing

## 2018-08-09 ENCOUNTER — Encounter (HOSPITAL_COMMUNITY)
Admission: EM | Disposition: A | Discharge status: Home / Self Care | Payer: Self-pay | Source: Home / Self Care | Attending: Internal Medicine

## 2018-08-09 DIAGNOSIS — E1159 Type 2 diabetes mellitus with other circulatory complications: Secondary | ICD-10-CM

## 2018-08-09 HISTORY — PX: CORONARY STENT INTERVENTION: CATH118234

## 2018-08-09 HISTORY — PX: LEFT HEART CATH: CATH118248

## 2018-08-09 HISTORY — PX: CORONARY ATHERECTOMY: CATH118238

## 2018-08-09 LAB — GLUCOSE, CAPILLARY
Glucose-Capillary: 130 mg/dL — ABNORMAL HIGH (ref 70–99)
Glucose-Capillary: 123 mg/dL — ABNORMAL HIGH (ref 70–99)
Glucose-Capillary: 111 mg/dL — ABNORMAL HIGH (ref 70–99)
Glucose-Capillary: 86 mg/dL (ref 70–99)

## 2018-08-09 LAB — CBC
HCT: 24.6 % — ABNORMAL LOW (ref 39.0–52.0)
HCT: 22.3 % — ABNORMAL LOW (ref 39.0–52.0)
Hemoglobin: 8.5 g/dL — ABNORMAL LOW (ref 13.0–17.0)
Hemoglobin: 7.7 g/dL — ABNORMAL LOW (ref 13.0–17.0)
MCH: 32.3 pg (ref 26.0–34.0)
MCH: 32.4 pg (ref 26.0–34.0)
MCHC: 34.6 g/dL (ref 30.0–36.0)
MCHC: 34.5 g/dL (ref 30.0–36.0)
MCV: 93.5 fL (ref 80.0–100.0)
MCV: 93.7 fL (ref 80.0–100.0)
Platelets: 229 10*3/uL (ref 150–400)
Platelets: 215 10*3/uL (ref 150–400)
RBC: 2.63 MIL/uL — ABNORMAL LOW (ref 4.22–5.81)
RBC: 2.38 MIL/uL — ABNORMAL LOW (ref 4.22–5.81)
RDW: 13.1 % (ref 11.5–15.5)
RDW: 13 % (ref 11.5–15.5)
WBC: 7 10*3/uL (ref 4.0–10.5)
WBC: 6.6 10*3/uL (ref 4.0–10.5)
nRBC: 0 % (ref 0.0–0.2)
nRBC: 0 % (ref 0.0–0.2)

## 2018-08-09 LAB — BASIC METABOLIC PANEL
Anion gap: 17 — ABNORMAL HIGH (ref 5–15)
BUN: 43 mg/dL — ABNORMAL HIGH (ref 8–23)
CO2: 23 mmol/L (ref 22–32)
Calcium: 8.6 mg/dL — ABNORMAL LOW (ref 8.9–10.3)
Chloride: 90 mmol/L — ABNORMAL LOW (ref 98–111)
Creatinine, Ser: 7.94 mg/dL — ABNORMAL HIGH (ref 0.61–1.24)
GFR calc Af Amer: 6 mL/min — ABNORMAL LOW (ref >60)
GFR calc non Af Amer: 6 mL/min — ABNORMAL LOW (ref >60)
Glucose, Bld: 98 mg/dL (ref 70–99)
Potassium: 4 mmol/L (ref 3.5–5.1)
Sodium: 130 mmol/L — ABNORMAL LOW (ref 135–145)

## 2018-08-09 LAB — BPAM RBC
Blood Product Expiration Date: 202007212359
ISSUE DATE / TIME: 202007150239
Unit Type and Rh: 600

## 2018-08-09 LAB — CREATININE, SERUM
Creatinine, Ser: 7.17 mg/dL — ABNORMAL HIGH (ref 0.61–1.24)
GFR calc Af Amer: 7 mL/min — ABNORMAL LOW (ref >60)
GFR calc non Af Amer: 6 mL/min — ABNORMAL LOW (ref >60)

## 2018-08-09 LAB — TYPE AND SCREEN
ABO/RH(D): A NEG
Antibody Screen: NEGATIVE
Unit division: 0

## 2018-08-09 LAB — POCT ACTIVATED CLOTTING TIME
Activated Clotting Time: 268 seconds
Activated Clotting Time: 257 seconds
Activated Clotting Time: 213 seconds
Activated Clotting Time: 186 seconds

## 2018-08-09 LAB — HEPATITIS B SURFACE ANTIBODY,QUALITATIVE: Hep B S Ab: REACTIVE

## 2018-08-09 SURGERY — CORONARY ATHERECTOMY
Anesthesia: LOCAL

## 2018-08-09 MED ORDER — CINACALCET HCL 30 MG PO TABS: 30.0000 mg | ORAL_TABLET | Freq: Every day | ORAL | Status: DC

## 2018-08-09 MED ORDER — HYDRALAZINE HCL 20 MG/ML IJ SOLN: 10.0000 mg | INTRAMUSCULAR | Status: AC | PRN

## 2018-08-09 MED ORDER — HEPARIN (PORCINE) IN NACL 1000-0.9 UT/500ML-% IV SOLN
INTRAVENOUS | Status: DC | PRN
  Administered 2018-08-09: 500 mL

## 2018-08-09 MED ORDER — MIDAZOLAM HCL 2 MG/2ML IJ SOLN
INTRAMUSCULAR | Status: AC
  Filled 2018-08-09: qty 2

## 2018-08-09 MED ORDER — ACETAMINOPHEN 325 MG PO TABS
650.0000 mg | ORAL_TABLET | ORAL | Status: DC | PRN
  Administered 2018-08-09: 650 mg via ORAL

## 2018-08-09 MED ORDER — LIDOCAINE HCL (PF) 1 % IJ SOLN
INTRAMUSCULAR | Status: DC | PRN
  Administered 2018-08-09: 20 mL

## 2018-08-09 MED ORDER — SEVELAMER CARBONATE 800 MG PO TABS: 1600.0000 mg | ORAL_TABLET | Freq: Three times a day (TID) | ORAL | Status: DC

## 2018-08-09 MED ORDER — ACETAMINOPHEN 325 MG PO TABS
ORAL_TABLET | ORAL | Status: AC
  Filled 2018-08-09: qty 2

## 2018-08-09 MED ORDER — IOHEXOL 350 MG/ML SOLN
INTRAVENOUS | Status: DC | PRN
  Administered 2018-08-09: 55 mL via INTRA_ARTERIAL

## 2018-08-09 MED ORDER — HEPARIN SODIUM (PORCINE) 5000 UNIT/ML IJ SOLN
5000.0000 [IU] | Freq: Three times a day (TID) | INTRAMUSCULAR | Status: DC
  Administered 2018-08-09 – 2018-08-10 (×2): 5000 [IU] via SUBCUTANEOUS
  Filled 2018-08-09 (×2): qty 1

## 2018-08-09 MED ORDER — HEPARIN SODIUM (PORCINE) 1000 UNIT/ML IJ SOLN
INTRAMUSCULAR | Status: AC
  Filled 2018-08-09: qty 1

## 2018-08-09 MED ORDER — SODIUM CHLORIDE 0.9% FLUSH
3.0000 mL | Freq: Two times a day (BID) | INTRAVENOUS | Status: DC
  Administered 2018-08-09 – 2018-08-10 (×2): 3 mL via INTRAVENOUS

## 2018-08-09 MED ORDER — MIDAZOLAM HCL 2 MG/2ML IJ SOLN
INTRAMUSCULAR | Status: DC | PRN
  Administered 2018-08-09: 1 mg via INTRAVENOUS

## 2018-08-09 MED ORDER — FENTANYL CITRATE (PF) 100 MCG/2ML IJ SOLN
INTRAMUSCULAR | Status: DC | PRN
  Administered 2018-08-09: 12.5 ug via INTRAVENOUS
  Administered 2018-08-09: 25 ug via INTRAVENOUS

## 2018-08-09 MED ORDER — FENTANYL CITRATE (PF) 100 MCG/2ML IJ SOLN
INTRAMUSCULAR | Status: AC
  Filled 2018-08-09: qty 2

## 2018-08-09 MED ORDER — SODIUM CHLORIDE 0.9 % IV SOLN: 250.0000 mL | INTRAVENOUS | Status: DC | PRN

## 2018-08-09 MED ORDER — LIDOCAINE HCL (PF) 1 % IJ SOLN
INTRAMUSCULAR | Status: AC
  Filled 2018-08-09: qty 30

## 2018-08-09 MED ORDER — ASPIRIN 81 MG PO CHEW
81.0000 mg | CHEWABLE_TABLET | Freq: Every day | ORAL | Status: DC
  Administered 2018-08-10: 81 mg via ORAL
  Filled 2018-08-09: qty 1

## 2018-08-09 MED ORDER — METOPROLOL SUCCINATE ER 25 MG PO TB24: 25.0000 mg | ORAL_TABLET | ORAL | Status: DC

## 2018-08-09 MED ORDER — LABETALOL HCL 5 MG/ML IV SOLN: 10.0000 mg | INTRAVENOUS | Status: AC | PRN

## 2018-08-09 MED ORDER — ONDANSETRON HCL 4 MG/2ML IJ SOLN: 4.0000 mg | Freq: Four times a day (QID) | INTRAMUSCULAR | Status: DC | PRN

## 2018-08-09 MED ORDER — SODIUM CHLORIDE 0.9% FLUSH: 3.0000 mL | INTRAVENOUS | Status: DC | PRN

## 2018-08-09 MED ORDER — CLOPIDOGREL BISULFATE 75 MG PO TABS
75.0000 mg | ORAL_TABLET | Freq: Every day | ORAL | Status: DC
  Administered 2018-08-10: 75 mg via ORAL
  Filled 2018-08-09: qty 1

## 2018-08-09 MED ORDER — HEPARIN (PORCINE) IN NACL 1000-0.9 UT/500ML-% IV SOLN
INTRAVENOUS | Status: AC
  Filled 2018-08-09: qty 1000

## 2018-08-09 MED ORDER — HEPARIN SODIUM (PORCINE) 1000 UNIT/ML IJ SOLN
INTRAMUSCULAR | Status: DC | PRN
  Administered 2018-08-09: 8000 [IU] via INTRAVENOUS
  Administered 2018-08-09: 3000 [IU] via INTRAVENOUS

## 2018-08-09 MED ORDER — NITROGLYCERIN 1 MG/10 ML FOR IR/CATH LAB
INTRA_ARTERIAL | Status: AC
  Filled 2018-08-09: qty 10

## 2018-08-09 MED FILL — Verapamil HCl IV Soln 2.5 MG/ML: INTRAVENOUS | Qty: 2 | Status: AC

## 2018-08-09 SURGICAL SUPPLY — 19 items
BALLN SAPPHIRE 3.0X12 (BALLOONS) ×2
BALLN SAPPHIRE ~~LOC~~ 3.75X12 (BALLOONS) ×2 IMPLANT
BALLOON SAPPHIRE 3.0X12 (BALLOONS) ×1 IMPLANT
CATH INFINITI JR4 5F (CATHETERS) ×2 IMPLANT
CATH LAUNCHER 6FR EBU 3.75 (CATHETERS) ×2 IMPLANT
CROWN DIAMONDBACK CLASSIC 1.25 (BURR) ×2 IMPLANT
ELECT DEFIB PAD ADLT CADENCE (PAD) ×2 IMPLANT
KIT HEART LEFT (KITS) ×2 IMPLANT
KIT HEMO VALVE WATCHDOG (MISCELLANEOUS) ×2 IMPLANT
PACK CARDIAC CATHETERIZATION (CUSTOM PROCEDURE TRAY) ×2 IMPLANT
SHEATH PINNACLE 6F 10CM (SHEATH) ×2 IMPLANT
SHEATH PROBE COVER 6X72 (BAG) ×2 IMPLANT
STENT SYNERGY DES 3.5X16 (Permanent Stent) ×2 IMPLANT
TRANSDUCER W/STOPCOCK (MISCELLANEOUS) ×2 IMPLANT
TUBING CIL FLEX 10 FLL-RA (TUBING) ×2 IMPLANT
WIRE ASAHI PROWATER 180CM (WIRE) ×2 IMPLANT
WIRE EMERALD 3MM-J .035X150CM (WIRE) ×2 IMPLANT
WIRE HI TORQ BMW 190CM (WIRE) ×2 IMPLANT
WIRE VIPERWIRE COR FLEX .012 (WIRE) ×2 IMPLANT

## 2018-08-09 NOTE — Interval H&P Note (Signed)
Cath Lab Visit (complete for each Cath Lab visit)  Clinical Evaluation Leading to the Procedure:   ACS: Yes.    Non-ACS:    Anginal Classification: CCS IV  Anti-ischemic medical therapy: Minimal Therapy (1 class of medications)  Non-Invasive Test Results: No non-invasive testing performed  Prior CABG: No previous CABG    Atherectomy discussed by Dr. Tamala Julian with patient and family.  History and Physical Interval Note:  08/09/2018 10:40 AM  Nicholas [name]  has presented today for surgery, with the diagnosis of post cardiac cath.  The various methods of treatment have been discussed with the patient and family. After consideration of risks, benefits and other options for treatment, the patient has consented to  Procedure(s): CORONARY ATHERECTOMY (N/A) as a surgical intervention.  The patient's history has been reviewed, patient examined, no change in status, stable for surgery.  I have reviewed the patient's chart and labs.  Questions were answered to the patient's satisfaction.     Larae Grooms

## 2018-08-09 NOTE — Progress Notes (Addendum)
[facility] Progress Note   Subjective:   Patient seen and examined at bedside.  Waiting to go for the procedure.  No new complaints.  Denies CP, SOB, n/v/d, and edema.  Objective Vitals:   08/08/18 1447 08/08/18 1538 08/08/18 2048 08/09/18 0406  BP: (!) 99/38 (!) 104/56 101/65 112/65  Pulse: 97  95 (!) 103  Resp: 19  18 20   Temp: 98.1 F (36.7 C)  99.1 F (37.3 C) 98.6 F (37 C)  TempSrc: Oral  Oral Oral  SpO2: 94%  95% 98%  Weight:    70 kg  Height:       Physical Exam General:NAD, WDWN male, laying in bed Heart:RRR Lungs:CTAB Abdomen:soft, NTND Extremities:no edema Dialysis Access: LU AVF +b, dressed c/d/i   Filed Weights   08/08/18 0045 08/08/18 0420 08/09/18 0406  Weight: 75.2 kg 73.1 kg 70 kg    Intake/Output Summary (Last 24 hours) at 08/09/2018 0913 Last data filed at 08/08/2018 2120 Gross per 24 hour  Intake 240 ml  Output 0 ml  Net 240 ml    Additional Objective Labs: Basic Metabolic Panel: Recent Labs  Lab 08/08/18 0005 08/08/18 0539 08/09/18 0354  NA 131* 132* 130*  K 4.4 3.6 4.0  CL 91* 89* 90*  CO2 19* 24 23  GLUCOSE 101* 101* 98  BUN 71* 23 43*  CREATININE 11.75* 5.40* 7.94*  CALCIUM 8.5* 8.9 8.6*  PHOS 6.1*  --   --    Liver Function Tests: Recent Labs  Lab 08/06/18 1854 08/08/18 0005  AST 32  --   ALT 27  --   ALKPHOS 70  --   BILITOT 0.7  --   PROT 7.7  --   ALBUMIN 3.5 2.7*   CBC: Recent Labs  Lab 08/06/18 1854 08/07/18 0238 08/08/18 0005 08/08/18 0539 08/09/18 0354  WBC 9.3 7.8 7.1 7.3 7.0  NEUTROABS 7.3  --   --   --   --   HGB 7.7* 7.2* 6.9* 9.2* 8.5*  HCT 23.4* 20.6* 20.1* 26.3* 24.6*  MCV 98.7 94.5 95.3 93.6 93.5  PLT 209 204 222 223 229   CBG: Recent Labs  Lab 08/08/18 1019 08/08/18 1142 08/08/18 1715 08/08/18 2144 08/09/18 0715  GLUCAP 91 95 105* 100* 130*   Iron Studies:  Recent Labs    08/08/18 0539  IRON 102  TIBC 262  FERRITIN 2,453*   Lab Results  Component Value Date    INR 1.1 08/08/2018   Studies/Results: No results found.  Medications: . sodium chloride    . sodium chloride    . sodium chloride    . sodium chloride     . aspirin  81 mg Oral Daily  . atorvastatin  80 mg Oral q1800  . Chlorhexidine Gluconate Cloth  6 each Topical Q0600  . Chlorhexidine Gluconate Cloth  6 each Topical Q0600  . cinacalcet  30 mg Oral Q supper  . clopidogrel  75 mg Oral Daily  . darbepoetin (ARANESP) injection - DIALYSIS  150 mcg Intravenous Q Tue-HD  . heparin  5,000 Units Subcutaneous Q8H  . insulin aspart  0-5 Units Subcutaneous QHS  . insulin aspart  0-9 Units Subcutaneous TID WC  . metoprolol tartrate  12.5 mg Oral BID  . sevelamer carbonate  1,600 mg Oral TID WC  . sodium chloride flush  3 mL Intravenous Q12H  . sodium chloride flush  3 mL Intravenous Q12H  . sodium chloride flush  3 mL Intravenous Q12H  .  sodium chloride flush  3 mL Intravenous Q12H    [id number]. HD Bath3K, Dialyzerunknown, Heparinnone. Accessleft upper AVF- 450 BFR.No other meds with HD- He tells me he is on renvela 2 with meals   Assessment/Plan: 85 year old BM with multiple medical problems including ESRD and CAD. He now presents with an NSTEMI- for medical management  1. NSTEMI - Echo w/LVEF 35%. Heart cath showed 3 vessel disease, plan for high risk PCI today.  Lopressor, asa and lipitor with heparin drip. Per cards 2. ESRD - on HD TTS.  K 4.0. HD planned for today post procedure.  3. Anemia of CKD- Hgb 8.5 s/p 1unit pRBC? Ferritin^, no Fe load. Aranesp q Tuesday. Expect drop post procedure, will transfuse if indicated.  4. Secondary hyperparathyroidism - Ca in goal, phos^.  Continue sensipar and renvela  5. HTN/volume - BP variable, soft this AM.  Volume status stable, does not appear overloaded.  Under EDW.  Minimal UF with HD today. 6. Nutrition - Renal diet w/fluid restrictions once advanced. 7. Combined systolic and  diastolic HF  Jen Mow, PA-C [facility] Pager: [phone] 08/09/2018,9:13 AM  LOS: 3 days    Patient seen and examined, agree with above note with above modifications. Seen- waiting for high risk PCI-  [place] OK about it.  Planning for routine HD later today.  Watch hgb closely may need transfusion- hemodynamically stable  Corliss Parish, MD 08/09/2018

## 2018-08-09 NOTE — Progress Notes (Signed)
    6 Fr. Sheath was pulled from the R femoral artery, and manual pressure was held for 30 min., due to a continuous ooze. R groin is soft and non tender. Sterile gauze was applied at the site. R DP was palpable before and after the sheath pull.  Bed rest started at 1530 X 4 hr. Instructions were given to patient about this time.  HR 100 ST BP 106/54 sPO2 97% on R/A

## 2018-08-09 NOTE — Progress Notes (Signed)
[facility] PROGRESS NOTE  [place] Monds OFB:[id number] DOB: [birth year] DOA: 08/06/2018 PCP: Carol Ada, MD  P High risk unstable angina, EF 30%-initial cath severe three-vessel disease PCI done 7/16 t Further management per cardiology Current meds = metoprolol 12.5 twice daily, Lipitor 80 daily, aspirin 81, Plavix 75 daily-might need to adjust beta-blocker to non-HD days?  Apparently his cardiologist at [facility] had to discontinue and then subsequently re-titrate late 2019 2/2 hypotension with dialysis-appreciate guidance from cardiology regarding dosing periprocedural ESRD HD TTS Anemia of renal disease Dialyzing today post-cath Repeat phosphorus a.m.-continue Sensipar 30 daily, Renvela 1.6 g 3 times daily Saturations okay 7/15 DM TY 2 Sugars ranging 98-1 30-Home meds 70/30 insulin 20 units twice daily Continuing at this time sensitive coverage in addition to sliding scale blood sugar ranges 3 Prior right renal cell CA status post nephrectomy Outpatient surveillance PRN   --- Synopsis 85 year old male ESRD HD TTS [employer] [facility]-right renal cell CA s/p nephrectomy 2018 Prior CAD Rx medically?  2009 DM TY 2 HTN HLD Admitted 08/06/2018 chest pain-intermittent-troponin high-sensitivity 242 ST depressions across V3 through 6  Cardiology consulted nephrology consulted Catheterized found to have three-vessel disease  PCI performed 7/16 as below    Estimated body mass index is 23.46 kg/m as calculated from the following:   Height as of this encounter: 5\' 8"  (1.727 m).   Weight as of this encounter: 70 kg.  Heparin + on plavix Full code Inpatient Not ready for discharge--family updated by Dr. Irish Lack earlier today Called spouse and informed re: possibility am d/c home   Verlon Au, MD [facility]  [place]ia [place] [email or web address] 7PM-7AM contact night coverage as above 08/09/2018, 1:01 PM  LOS: 3 days   ---  Consultants:  Nephrology  Cardiology  Antimicrobials:  None --- Today Seen in recovery from Cath Looks good slight sleepy no cp no fever  O  Vitals:  Vitals:   08/09/18 1149 08/09/18 1217  BP: (!) 100/59 (!) 100/46  Pulse: (!) 0 (!) 102  Resp: (!) 0 (!) 22  Temp:    SpO2: (!) 0%     Exam:   eomi arcus, well manicured moustache edentulous No jvd No bruit s1 s2 in NSR cta b abd soft groind dressing sligt red tinged   I have personally reviewed the following:   DATA Conclusion     Prox LAD lesion is 90% stenosed.  A drug-eluting stent was successfully placed using a STENT SYNERGY DES 3.5X16.  1st Diag lesion is 80% stenosed and jailed by the stent. TIMIT 1 flow.  1st Mrg lesion is 100% stenosed.  Prox Cx lesion is 80% stenosed.  Post intervention, there is a 0% residual stenosis.  LV end diastolic pressure is normal.  There is no aortic valve stenosis.   Continue clopidogrel along with aggressive secondary prevention.     Dialysis later today.        Scheduled Meds: . [MAR Hold] aspirin  81 mg Oral Daily  . [MAR Hold] atorvastatin  80 mg Oral q1800  . [MAR Hold] Chlorhexidine Gluconate Cloth  6 each Topical Q0600  . [MAR Hold] Chlorhexidine Gluconate Cloth  6 each Topical Q0600  . [MAR Hold] cinacalcet  30 mg Oral Q supper  . [MAR Hold] clopidogrel  75 mg Oral Daily  . [MAR Hold] darbepoetin (ARANESP) injection - DIALYSIS  150 mcg Intravenous Q Tue-HD  . [MAR Hold] heparin  5,000 Units Subcutaneous Q8H  . [MAR Hold] insulin aspart  0-5 Units Subcutaneous  QHS  . [MAR Hold] insulin aspart  0-9 Units Subcutaneous TID WC  . [MAR Hold] metoprolol tartrate  12.5 mg Oral BID  . [MAR Hold] sevelamer carbonate  1,600 mg Oral TID WC  . [MAR Hold] sodium chloride flush  3 mL Intravenous Q12H  . [MAR Hold] sodium chloride flush  3 mL Intravenous Q12H  . [MAR Hold] sodium chloride flush  3 mL Intravenous Q12H  . sodium chloride flush  3 mL  Intravenous Q12H   Continuous Infusions: . sodium chloride    . [MAR Hold] sodium chloride    . sodium chloride    . sodium chloride      Principal Problem:   NSTEMI (non-ST elevated myocardial infarction) ([place]) Active Problems:   Diabetes mellitus ([place])   Hypertension   Hypercholesteremia   ESRD (end stage renal disease) on dialysis (HCC)   Anemia of chronic disease   LOS: 3 days

## 2018-08-09 NOTE — Progress Notes (Signed)
Progress Note  Patient Name: Nicholas [name] Date of Encounter: 08/09/2018  Primary Cardiologist: To be determined  Subjective   No problems over night.  Dialysis will need to be performed after today's procedure.Marland Kitchen  Extensive conversation yesterday with the patient, wife, and son.  The patient decided against consideration of bypass surgery.  Therefore, surgical consultation was not obtained.  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  Relatively low blood pressures chronically will prevent adequate anti-ischemic therapy.  Discussed the risk of bleeding/severe anemia on dual antiplatelet therapy.  Discussed treatment options that would include medical therapy versus high risk orbital atherectomy and stenting (low EF/heavily calcified vessels/anemia/age/end-stage renal disease).  After contemplative discussion all agreed to proceed with PCI/stenting.  I estimated the risk of complications to be 3-5 fold higher than typical with risk of stroke, death, myocardial infarction, bleeding, to be in excess of 10%.  We discussed this again this morning and he has not changed his mind and is willing to proceed.  Hemodialysis planned for later today after the procedure.  Inpatient Medications    Scheduled Meds:  [MAR Hold] aspirin  81 mg Oral Daily   [MAR Hold] atorvastatin  80 mg Oral q1800   [MAR Hold] Chlorhexidine Gluconate Cloth  6 each Topical Q0600   [MAR Hold] Chlorhexidine Gluconate Cloth  6 each Topical Q0600   [MAR Hold] cinacalcet  30 mg Oral Q supper   [MAR Hold] clopidogrel  75 mg Oral Daily   [MAR Hold] darbepoetin (ARANESP) injection - DIALYSIS  150 mcg Intravenous Q Tue-HD   [MAR Hold] heparin  5,000 Units Subcutaneous Q8H   [MAR Hold] insulin aspart  0-5 Units Subcutaneous QHS   [MAR Hold] insulin aspart  0-9 Units Subcutaneous TID WC   [MAR Hold] metoprolol tartrate  12.5 mg Oral BID   [MAR Hold] sevelamer carbonate  1,600 mg Oral  TID WC   [MAR Hold] sodium chloride flush  3 mL Intravenous Q12H   [MAR Hold] sodium chloride flush  3 mL Intravenous Q12H   [MAR Hold] sodium chloride flush  3 mL Intravenous Q12H   sodium chloride flush  3 mL Intravenous Q12H   Continuous Infusions:  sodium chloride     [MAR Hold] sodium chloride     sodium chloride     sodium chloride     PRN Meds: [MAR Hold] sodium chloride, sodium chloride, [MAR Hold] acetaminophen, fentaNYL, Heparin (Porcine) in NaCl, Heparin (Porcine) in NaCl, heparin, lidocaine (PF), midazolam, [MAR Hold] nitroGLYCERIN, [MAR Hold] ondansetron (ZOFRAN) IV, [MAR Hold] ondansetron **OR** [DISCONTINUED] ondansetron (ZOFRAN) IV, [MAR Hold] sodium chloride flush, sodium chloride flush   Vital Signs    Vitals:   08/08/18 1538 08/08/18 2048 08/09/18 0406 08/09/18 0942  BP: (!) 104/56 101/65 112/65 (!) 99/54  Pulse:  95 (!) 103   Resp:  18 20   Temp:  99.1 F (37.3 C) 98.6 F (37 C)   TempSrc:  Oral Oral   SpO2:  95% 98%   Weight:   70 kg   Height:        Intake/Output Summary (Last 24 hours) at 08/09/2018 1119 Last data filed at 08/08/2018 2120 Gross per 24 hour  Intake 240 ml  Output 0 ml  Net 240 ml   Filed Weights   08/08/18 0045 08/08/18 0420 08/09/18 0406  Weight: 75.2 kg 73.1 kg 70 kg    Telemetry    NSR - Personally Reviewed  ECG    Electrocardiogram performed on  08/06/2018 demonstrated significant anterolateral ST depression with poor R wave progression throughout the precordium.  Repeat EKG on 08/07/2018 reveals resolution of ST abnormality and symmetrical precordial T wave inversion.- Personally Reviewed  Physical Exam  He appears younger than his stated age.  He moves slowly. Femoral cath site from yesterday is unremarkable Blood pressure is relatively low.  May need volume expansion  Labs    Chemistry Recent Labs  Lab 08/06/18 1854  08/08/18 0005 08/08/18 0539 08/09/18 0354  NA 128*   < > 131* 132* 130*  K 4.0   < >  4.4 3.6 4.0  CL 86*   < > 91* 89* 90*  CO2 23   < > 19* 24 23  GLUCOSE 208*   < > 101* 101* 98  BUN 55*   < > 71* 23 43*  CREATININE 9.43*   < > 11.75* 5.40* 7.94*  CALCIUM 8.4*   < > 8.5* 8.9 8.6*  PROT 7.7  --   --   --   --   ALBUMIN 3.5  --  2.7*  --   --   AST 32  --   --   --   --   ALT 27  --   --   --   --   ALKPHOS 70  --   --   --   --   BILITOT 0.7  --   --   --   --   GFRNONAA 5*   < > 3* 9* 6*  GFRAA 5*   < > 4* 10* 6*  ANIONGAP 19*   < > 21* 19* 17*   < > = values in this interval not displayed.     Hematology Recent Labs  Lab 08/08/18 0005 08/08/18 0539 08/09/18 0354  WBC 7.1 7.3 7.0  RBC 2.11* 2.81* 2.63*  HGB 6.9* 9.2* 8.5*  HCT 20.1* 26.3* 24.6*  MCV 95.3 93.6 93.5  MCH 32.7 32.7 32.3  MCHC 34.3 35.0 34.6  RDW 12.3 12.5 13.1  PLT 222 223 229    Cardiac EnzymesNo results for input(s): TROPONINI in the last 168 hours. No results for input(s): TROPIPOC in the last 168 hours.   BNP Recent Labs  Lab 08/06/18 1854  BNP 662.8*     DDimer No results for input(s): DDIMER in the last 168 hours.   Radiology    No results found.  Cardiac Studies   2D Doppler echocardiogram performed on 08/07/2018  IMPRESSIONS    1. The left ventricle has moderately reduced systolic function, with an ejection fraction of 35-40%. The cavity size was normal. There is mild asymmetric left ventricular hypertrophy of the basal anteroseptal wall. Left ventricular diastolic Doppler  parameters are consistent with pseudonormalization. Elevated left atrial and left ventricular end-diastolic pressures The E/e' is >25.  2. Moderate hypokinesis of the left ventricular, mid-apical anteroseptal wall, anterior segment, anterior wall and inferoapical.  3. The right ventricle has normal systolic function. The cavity was normal. There is no increase in right ventricular wall thickness.  4. The mitral valve is abnormal. Mild thickening of the mitral valve leaflet. There is mild  mitral annular calcification present. Mitral valve regurgitation is mild to moderate by color flow Doppler.  5. The tricuspid valve is abnormal.  6. The aortic valve is tricuspid. Mild calcification of the aortic valve. No stenosis of the aortic valve.  Coronary angiography 08/08/2018: Diagnostic Dominance: Right    Patient Profile     85 y.o. male  with a hx of CAD with chronic stable angina, ESRD on HD TTS, IDT2DM, HTN, HLD, anemia of chronic disease requiring transfusion admitted with NSTEMI, large enzyme leak and reduced LVEF-> 35%..  Assessment & Plan    1. Anterior non-ST elevation myocardial infarction: High-grade LAD in setting of severe three-vessel coronary disease with total occlusion of RCA and the dominant obtuse marginal of the circumflex. 2. Coronary atherosclerosis: Discussed management strategy for severe three-vessel coronary disease.  Excluded surgery is a reasonable option at the patient's request.  Medical therapy versus high risk PCI debated and now all agreed that high risk PCI gives best opportunity for preservation of functional status and survival.  Patient understands that procedure is high risk and potentially tenfold higher complication rate making possibility of significant complication in the 10 to 20% range. 3. End-stage kidney disease on chronic hemodialysis: Will have dialysis after the procedure today.   4. Acute on chronic combined systolic and diastolic heart failure: No clinical evidence of volume overload on exam today. 5. Significant anemia requiring transfusion: Possible aggravation of anemia and intolerance of dual antiplatelet therapy due to GI bleeding was frankly discussed with the patient and family who understand the downside risk.  For questions or updates, please contact [place] Please consult [email or web address] for contact info under Cardiology/STEMI.      Signed, Sinclair Grooms, MD  08/09/2018, 11:19 AM

## 2018-08-09 NOTE — Care Management Important Message (Signed)
Important Message  Patient Details  Name: Nicholas [name] MRN: [id number] Date of Birth: 09/16/1930   Medicare Important Message Given:  Yes     Shelda Altes 08/09/2018, 1:52 PM

## 2018-08-10 ENCOUNTER — Encounter (HOSPITAL_COMMUNITY): Payer: Self-pay | Admitting: Interventional Cardiology

## 2018-08-10 LAB — POCT ACTIVATED CLOTTING TIME: Activated Clotting Time: 224 seconds

## 2018-08-10 LAB — CBC
HCT: 23.2 % — ABNORMAL LOW (ref 39.0–52.0)
Hemoglobin: 7.8 g/dL — ABNORMAL LOW (ref 13.0–17.0)
MCH: 32.4 pg (ref 26.0–34.0)
MCHC: 33.6 g/dL (ref 30.0–36.0)
MCV: 96.3 fL (ref 80.0–100.0)
Platelets: 240 10*3/uL (ref 150–400)
RBC: 2.41 MIL/uL — ABNORMAL LOW (ref 4.22–5.81)
RDW: 13.1 % (ref 11.5–15.5)
WBC: 8.4 10*3/uL (ref 4.0–10.5)
nRBC: 0 % (ref 0.0–0.2)

## 2018-08-10 LAB — GLUCOSE, CAPILLARY
Glucose-Capillary: 115 mg/dL — ABNORMAL HIGH (ref 70–99)
Glucose-Capillary: 119 mg/dL — ABNORMAL HIGH (ref 70–99)
Glucose-Capillary: 163 mg/dL — ABNORMAL HIGH (ref 70–99)

## 2018-08-10 LAB — RENAL FUNCTION PANEL
Albumin: 2.7 g/dL — ABNORMAL LOW (ref 3.5–5.0)
Anion gap: 20 — ABNORMAL HIGH (ref 5–15)
BUN: 25 mg/dL — ABNORMAL HIGH (ref 8–23)
CO2: 19 mmol/L — ABNORMAL LOW (ref 22–32)
Calcium: 8.8 mg/dL — ABNORMAL LOW (ref 8.9–10.3)
Chloride: 96 mmol/L — ABNORMAL LOW (ref 98–111)
Creatinine, Ser: 5.74 mg/dL — ABNORMAL HIGH (ref 0.61–1.24)
GFR calc Af Amer: 10 mL/min — ABNORMAL LOW (ref >60)
GFR calc non Af Amer: 8 mL/min — ABNORMAL LOW (ref >60)
Glucose, Bld: 122 mg/dL — ABNORMAL HIGH (ref 70–99)
Phosphorus: 4.3 mg/dL (ref 2.5–4.6)
Potassium: 4.4 mmol/L (ref 3.5–5.1)
Sodium: 135 mmol/L (ref 135–145)

## 2018-08-10 MED ORDER — CHLORHEXIDINE GLUCONATE CLOTH 2 % EX PADS: 6.0000 | MEDICATED_PAD | Freq: Every day | CUTANEOUS | Status: DC

## 2018-08-10 MED ORDER — ASPIRIN 81 MG PO CHEW: 81.0000 mg | CHEWABLE_TABLET | Freq: Every day | ORAL | Status: DC

## 2018-08-10 MED ORDER — CLOPIDOGREL BISULFATE 75 MG PO TABS: 75.0000 mg | ORAL_TABLET | Freq: Every day | ORAL | 3 refills | Status: DC

## 2018-08-10 MED ORDER — ATORVASTATIN CALCIUM 80 MG PO TABS: 80.0000 mg | ORAL_TABLET | Freq: Every day | ORAL | 3 refills | Status: DC

## 2018-08-10 MED ORDER — NITROGLYCERIN 0.4 MG SL SUBL: 0.4000 mg | SUBLINGUAL_TABLET | SUBLINGUAL | 1 refills | Status: AC | PRN

## 2018-08-10 MED ORDER — METOPROLOL SUCCINATE ER 25 MG PO TB24: 25.0000 mg | ORAL_TABLET | ORAL | Status: DC

## 2018-08-10 MED FILL — Nitroglycerin IV Soln 100 MCG/ML in D5W: INTRA_ARTERIAL | Qty: 10 | Status: AC

## 2018-08-10 NOTE — Progress Notes (Signed)
CARDIAC REHAB PHASE I   PRE:  Rate/Rhythm: 102 ST    BP: sitting 113/58    SaO2:   MODE:  Ambulation: 140 ft   POST:  Rate/Rhythm: 106 ST    BP: sitting 106/56     SaO2:   Pt sleepy this am. Sts yesterday was rough. Able to get out of bed. Asked to use RW due to being in bed for days and felt weak. C/o fatigue and slight dizziness walking. Overall did fair with RW. He has RW at home if he needs to use it. Denied CP. Discussed MI, stent, Plavix, restrictions, exercise at home, NTG, and CPRII. Will defer diet to RD at HD (encouraged him to meet with him/her). Will refer to  [facility].  1010-1100  [place], ACSM 08/10/2018 10:58 AM

## 2018-08-10 NOTE — Progress Notes (Addendum)
[facility] Progress Note   Subjective:  Patient seen and examined at bedside.  Tired today after a long day yesterday with PCI followed by dialysis.  Denies CP, SOB, n/v/d, and edema.  States HD tolerated well.  No UF removed. Seen up walking int he hall- possible discharge today ??   Objective Vitals:   08/09/18 1938 08/09/18 2111 08/10/18 0449 08/10/18 0610  BP: (!) 102/56 107/63 (!) 102/55   Pulse: 100 (!) 104 (!) 104   Resp: 19 16 18    Temp: 97.9 F (36.6 C) 98.5 F (36.9 C) (!) 100.4 F (38 C) 99.1 F (37.3 C)  TempSrc: Oral Oral Oral Oral  SpO2: 94% 95% 96%   Weight: 69.9 kg  69.8 kg   Height:       Physical Exam General:NAD, NDNW male, laying in bed  Heart:RRR Lungs:CTAB Abdomen:soft, NTND Extremities:no edema Dialysis Access: LU AVF +b   Filed Weights   08/09/18 1625 08/09/18 1938 08/10/18 0449  Weight: 69.9 kg 69.9 kg 69.8 kg    Intake/Output Summary (Last 24 hours) at 08/10/2018 0929 Last data filed at 08/09/2018 2139 Gross per 24 hour  Intake 3 ml  Output 0 ml  Net 3 ml    Additional Objective Labs: Basic Metabolic Panel: Recent Labs  Lab 08/08/18 0005 08/08/18 0539 08/09/18 0354 08/09/18 1639 08/10/18 0443  NA 131* 132* 130*  --  135  K 4.4 3.6 4.0  --  4.4  CL 91* 89* 90*  --  96*  CO2 19* 24 23  --  19*  GLUCOSE 101* 101* 98  --  122*  BUN 71* 23 43*  --  25*  CREATININE 11.75* 5.40* 7.94* 7.17* 5.74*  CALCIUM 8.5* 8.9 8.6*  --  8.8*  PHOS 6.1*  --   --   --  4.3   Liver Function Tests: Recent Labs  Lab 08/06/18 1854 08/08/18 0005 08/10/18 0443  AST 32  --   --   ALT 27  --   --   ALKPHOS 70  --   --   BILITOT 0.7  --   --   PROT 7.7  --   --   ALBUMIN 3.5 2.7* 2.7*   CBC: Recent Labs  Lab 08/06/18 1854  08/08/18 0005 08/08/18 0539 08/09/18 0354 08/09/18 1639 08/10/18 0443  WBC 9.3   < > 7.1 7.3 7.0 6.6 8.4  NEUTROABS 7.3  --   --   --   --   --   --   HGB 7.7*   < > 6.9* 9.2* 8.5* 7.7* 7.8*  HCT  23.4*   < > 20.1* 26.3* 24.6* 22.3* 23.2*  MCV 98.7   < > 95.3 93.6 93.5 93.7 96.3  PLT 209   < > 222 223 229 215 240   < > = values in this interval not displayed.  CBG: Recent Labs  Lab 08/09/18 0715 08/09/18 1236 08/09/18 1621 08/09/18 2114 08/10/18 0738  GLUCAP 130* 123* 111* 86 119*   Iron Studies:  Recent Labs    08/08/18 0539  IRON 102  TIBC 262  FERRITIN 2,453*   Lab Results  Component Value Date   INR 1.1 08/08/2018   Studies/Results: No results found.  Medications: . sodium chloride 10 mL/hr (08/10/18 2130)  . sodium chloride    . sodium chloride     . aspirin  81 mg Oral Daily  . atorvastatin  80 mg Oral q1800  . Chlorhexidine Gluconate Cloth  6 each Topical Q0600  . Chlorhexidine Gluconate Cloth  6 each Topical Q0600  . cinacalcet  30 mg Oral Q supper  . clopidogrel  75 mg Oral Q breakfast  . darbepoetin (ARANESP) injection - DIALYSIS  150 mcg Intravenous Q Tue-HD  . heparin  5,000 Units Subcutaneous Q8H  . insulin aspart  0-5 Units Subcutaneous QHS  . insulin aspart  0-9 Units Subcutaneous TID WC  . metoprolol tartrate  12.5 mg Oral BID  . sevelamer carbonate  1,600 mg Oral TID WC  . sodium chloride flush  3 mL Intravenous Q12H  . sodium chloride flush  3 mL Intravenous Q12H  . sodium chloride flush  3 mL Intravenous Q12H  . sodium chloride flush  3 mL Intravenous Q12H    [id number] HD Bath3K, Dialyzerunknown, Heparinnone. Accessleft upper AVF- 450 BFR.No other meds with HD- He tells me he is on renvela 2 with meals  Assessment/Plan: 85 year old BM with multiple medical problems including ESRD and CAD. He now presents with an NSTEMI- for medical management  1.NSTEMI - Echo w/LVEF 35%. Heart cath showed severe 3 vessel disease. PCI yesterday - DES to pLAD, 1dt dig jailed by stent  Lopressor, asa, plavix and Lipitor.. Per cards 2. ESRD -on HD TTS. K 4.4. Orders written for HD tomorrow  per regular schedule. Possible discharge- if gos will go to OP clinic tomorrow  3. Anemia of CKD-Hgb 7.8 post procedure. s/p 1unit pRBC? Ferritin^, no Fe load.Aranesp q Tuesday. Will transfuse if indicated.  4. Secondary hyperparathyroidism -Ca in goal, phos^. Continue sensipar and renvela 5. HTN/volume -BP variable, soft this AM. Volume status stable, does not appear overloaded. Under EDW, will need new EDW on d/c.   6. Nutrition -Renal diet w/fluid restrictions once advanced, protein supplements.  7. Combined systolic and diastolic HF  Jen Mow, PA-C [facility] Pager: [phone] 08/10/2018,9:29 AM  LOS: 4 days    Patient seen and examined, agree with above note with above modifications. Did great with DES to LAD yest followed by HD.  Will be due for HD tomorrow- if goes can do as OP- will do here if stays  Corliss Parish, MD 08/10/2018

## 2018-08-10 NOTE — Progress Notes (Signed)
Renal Navigator notified OP HD clinic/[facility] of patient's discharge and faxed H&P, today's Nephrology note, and Discharge Summary to clinic in order to provide continuity of care.  Alphonzo Cruise, [place] Renal Navigator [phone]

## 2018-08-10 NOTE — Progress Notes (Addendum)
The patient has been seen in conjunction with Vin Bhagat, PAC. All aspects of care have been considered and discussed. The patient has been personally interviewed, examined, and all clinical data has been reviewed.   Tolerated elevated risk PCI without significant complication.  Right femoral access site is unremarkable.  Next big hurdle will be tolerance of dual antiplatelet therapy.  Current therapy is aspirin and clopidogrel.  Hemoglobin will need to be followed closely.  If significant bleeding we may have to sacrifice aspirin and use monotherapy with clopidogrel.  Getting beyond 3 months on dual antiplatelet therapy is key, if possible.  Hemoglobin needs to be done in 1 week and followed closely overtime.  Continue low-dose metoprolol tartrate  Eligible for discharge from cardiology standpoint.  Needs transition of care follow-up 7 to 14 days.  He is followed by [facility], Dr. Roseanne Reno.  Progress Note  Patient Name: Nicholas [name] Date of Encounter: 08/10/2018  Primary Cardiologist: [facility] or Dr. Tamala Julian  Subjective   No chest pain or dyspnea.   Inpatient Medications    Scheduled Meds: . aspirin  81 mg Oral Daily  . atorvastatin  80 mg Oral q1800  . Chlorhexidine Gluconate Cloth  6 each Topical Q0600  . Chlorhexidine Gluconate Cloth  6 each Topical Q0600  . cinacalcet  30 mg Oral Q supper  . clopidogrel  75 mg Oral Q breakfast  . darbepoetin (ARANESP) injection - DIALYSIS  150 mcg Intravenous Q Tue-HD  . heparin  5,000 Units Subcutaneous Q8H  . insulin aspart  0-5 Units Subcutaneous QHS  . insulin aspart  0-9 Units Subcutaneous TID WC  . metoprolol tartrate  12.5 mg Oral BID  . sevelamer carbonate  1,600 mg Oral TID WC  . sodium chloride flush  3 mL Intravenous Q12H  . sodium chloride flush  3 mL Intravenous Q12H  . sodium chloride flush  3 mL Intravenous Q12H  . sodium chloride flush  3 mL Intravenous Q12H   Continuous Infusions: . sodium chloride 10 mL/hr (08/10/18  5621)  . sodium chloride    . sodium chloride     PRN Meds: sodium chloride, sodium chloride, acetaminophen, acetaminophen, nitroGLYCERIN, ondansetron (ZOFRAN) IV, ondansetron **OR** [DISCONTINUED] ondansetron (ZOFRAN) IV, sodium chloride flush, sodium chloride flush   Vital Signs    Vitals:   08/09/18 1938 08/09/18 2111 08/10/18 0449 08/10/18 0610  BP: (!) 102/56 107/63 (!) 102/55   Pulse: 100 (!) 104 (!) 104   Resp: 19 16 18    Temp: 97.9 F (36.6 C) 98.5 F (36.9 C) (!) 100.4 F (38 C) 99.1 F (37.3 C)  TempSrc: Oral Oral Oral Oral  SpO2: 94% 95% 96%   Weight: 69.9 kg  69.8 kg   Height:        Intake/Output Summary (Last 24 hours) at 08/10/2018 0906 Last data filed at 08/09/2018 2139 Gross per 24 hour  Intake 3 ml  Output 0 ml  Net 3 ml   Last 3 Weights 08/10/2018 08/09/2018 08/09/2018  Weight (lbs) 153 lb 14.4 oz 154 lb 1.6 oz 154 lb 1.6 oz  Weight (kg) 69.809 kg 69.9 kg 69.9 kg      Telemetry    Sinus tachycardia in 100s- Personally Reviewed  ECG    Sinus tachycardia at 105 bpm - Personally Reviewed  Physical Exam   GEN: No acute distress.   Neck: No JVD Cardiac: RRR, no murmurs, rubs, or gallops.  Respiratory: Clear to auscultation bilaterally. GI: Soft, nontender, non-distended  MS: No  edema; No deformity. Neuro:  Nonfocal  Psych: Normal affect   Labs    High Sensitivity Troponin:   Recent Labs  Lab 08/06/18 1854 08/06/18 2338 08/07/18 1801  TROPONINIHS 242* 7,197* 22,363*     Chemistry Recent Labs  Lab 08/06/18 1854  08/08/18 0005 08/08/18 0539 08/09/18 0354 08/09/18 1639 08/10/18 0443  NA 128*   < > 131* 132* 130*  --  135  K 4.0   < > 4.4 3.6 4.0  --  4.4  CL 86*   < > 91* 89* 90*  --  96*  CO2 23   < > 19* 24 23  --  19*  GLUCOSE 208*   < > 101* 101* 98  --  122*  BUN 55*   < > 71* 23 43*  --  25*  CREATININE 9.43*   < > 11.75* 5.40* 7.94* 7.17* 5.74*  CALCIUM 8.4*   < > 8.5* 8.9 8.6*  --  8.8*  PROT 7.7  --   --   --   --   --    --   ALBUMIN 3.5  --  2.7*  --   --   --  2.7*  AST 32  --   --   --   --   --   --   ALT 27  --   --   --   --   --   --   ALKPHOS 70  --   --   --   --   --   --   BILITOT 0.7  --   --   --   --   --   --   GFRNONAA 5*   < > 3* 9* 6* 6* 8*  GFRAA 5*   < > 4* 10* 6* 7* 10*  ANIONGAP 19*   < > 21* 19* 17*  --  20*   < > = values in this interval not displayed.     Hematology Recent Labs  Lab 08/09/18 0354 08/09/18 1639 08/10/18 0443  WBC 7.0 6.6 8.4  RBC 2.63* 2.38* 2.41*  HGB 8.5* 7.7* 7.8*  HCT 24.6* 22.3* 23.2*  MCV 93.5 93.7 96.3  MCH 32.3 32.4 32.4  MCHC 34.6 34.5 33.6  RDW 13.1 13.0 13.1  PLT 229 215 240    BNP Recent Labs  Lab 08/06/18 1854  BNP 662.8*     DDimer No results for input(s): DDIMER in the last 168 hours.   Radiology    No results found.  Cardiac Studies   CORONARY STENT INTERVENTION  CORONARY ATHERECTOMY  Left Heart Cath  Conclusion    Prox LAD lesion is 90% stenosed.  A drug-eluting stent was successfully placed using a STENT SYNERGY DES 3.5X16.  1st Diag lesion is 80% stenosed and jailed by the stent. TIMIT 1 flow.  1st Mrg lesion is 100% stenosed.  Prox Cx lesion is 80% stenosed.  Post intervention, there is a 0% residual stenosis.  LV end diastolic pressure is normal.  There is no aortic valve stenosis.   Continue clopidogrel along with aggressive secondary prevention.     Diagnostic Dominance: Right  Intervention   2D Doppler echocardiogram performed on 08/07/2018  IMPRESSIONS   1. The left ventricle has moderately reduced systolic function, with an ejection fraction of 35-40%. The cavity size was normal. There is mild asymmetric left ventricular hypertrophy of the basal anteroseptal wall. Left ventricular diastolic Doppler  parameters are consistent with pseudonormalization.  Elevated left atrial and left ventricular end-diastolic pressures The E/e' is >25. 2. Moderate hypokinesis of the left ventricular,  mid-apical anteroseptal wall, anterior segment, anterior wall and inferoapical. 3. The right ventricle has normal systolic function. The cavity was normal. There is no increase in right ventricular wall thickness. 4. The mitral valve is abnormal. Mild thickening of the mitral valve leaflet. There is mild mitral annular calcification present. Mitral valve regurgitation is mild to moderate by color flow Doppler. 5. The tricuspid valve is abnormal. 6. The aortic valve is tricuspid. Mild calcification of the aortic valve. No stenosis of the aortic valve.  Coronary angiography 08/08/2018: Diagnostic Dominance: Right     Patient Profile     85 y.o. male with a hx of CAD with chronic stable angina,ESRD on HD TTS, IDT2DM, HTN, HLD, anemia of chronic disease requiring transfusion admitted with NSTEMI, large enzyme leak and reduced LVEF-> 35%..  Assessment & Plan    1. Anterior non-ST elevation myocardial infarction: High-grade LAD in setting of severe three-vessel coronary disease with total occlusion of RCA and the dominant obtuse marginal of the circumflex. The patient decided against consideration of bypass surgery. After long discussion with patient, wife and son he underwent atherectomy and PCI with DES to pLAD. 1dt dig jailed by stent. Continue ASA, Plavix, statin and metoprolol.   2. ESRD on HD - Underwent dialysis yesterday  3. Sinus tachycardia - Continue low dose BB. Unable to titrate further 2nd to soft blood pressure.   4.  Acute on chronic combined CHF - LVEF of 35-40% with Wm abnormality. Continue low dose BB. No ACE/ARB due to ESRD. Volume managed by dialysis  5. Anemia of CKD - transfused 1 unit initially. Hgb 7.8 today.  6. HLD - 08/06/2018: Cholesterol 202; HDL 68; LDL Cholesterol NOT CALCULATED; Triglycerides 200; VLDL 40  - Continue Lipitor 80mg . Repeat lab in 6 weeks   7. DM - A1c 6.8. Per primary team.     For questions or updates, please contact [place] Please consult [email or web address] for contact info under        SignedLeanor Kail, PA  08/10/2018, 9:06 AM

## 2018-08-10 NOTE — Discharge Summary (Signed)
Physician Discharge Summary  Nicholas [name] GPQ:[id number] DOB: [birth year] DOA: 08/06/2018  PCP: Carol Ada, MD  Admit date: 08/06/2018 Discharge date: 08/10/2018  Time spent: 37 minutes  Recommendations for Outpatient Follow-up:  1. Please note addition of aspirin Plavix and change of doses of 325 aspirin this admission-please note change of statin dose to highest intensity dosing 2. Will need care coordination transition visit Dr. Tamala Julian 1 to 2 weeks-I have also CCed Dr. Moshe Cipro to ensure that patient has labs done within the next week or so with dialysis including an iron level and a T sat as patient may require supplementation 3. Note dosage change of beta-blocker regimen to nondialysis days ONLY   Discharge Diagnoses:  Principal Problem:   NSTEMI (non-ST elevated myocardial infarction) ([place]) Active Problems:   Diabetes mellitus ([place])   Hypertension   Hypercholesteremia   ESRD (end stage renal disease) on dialysis ([place])   Anemia of chronic disease   Discharge Condition: Improved  Diet recommendation: Heart healthy diabetic  Filed Weights   08/09/18 1625 08/09/18 1938 08/10/18 0449  Weight: 69.9 kg 69.9 kg 69.8 kg    History of present illness:  85 year old male ESRD HD TTS [facility]-right renal cell CA s/p nephrectomy 2018 Prior CAD Rx medically?  2009 DM TY 2 HTN HLD Admitted 08/06/2018 chest pain-intermittent-troponin high-sensitivity 242 ST depressions across V3 through 6  Cardiology consulted nephrology consulted Catheterized found to have three-vessel disease  PCI performed 7/16 as below   Hospital Course:  High risk unstable angina, EF 30%-initial cath severe three-vessel disease PCI done 7/16 t Further management per cardiology Lipitor 80 daily, aspirin 81, Plavix 75 daily On discharge metoprolol was adjusted to nondialysis days metoprolol ER ESRD HD TTS Anemia of renal disease Dialyzing today post-cath Repeat phosphorus a.m.-continue  Sensipar 30 daily, Renvela 1.6 g 3 times daily Saturations okay 7/15 Have CCed nephrology to be aware of need to check iron levels and CBC closely post discharge given on DAPT at this time DM TY 2 Blood sugars well controlled resumed 70/30 insulin on discharge Prior right renal cell CA status post nephrectomy Outpatient surveillance PRN  Procedures: Cardiac cath 7/17 Conclusion    Prox LAD lesion is 90% stenosed.  A drug-eluting stent was successfully placed using a STENT SYNERGY DES 3.5X16.  1st Diag lesion is 80% stenosed and jailed by the stent. TIMIT 1 flow.  1st Mrg lesion is 100% stenosed.  Prox Cx lesion is 80% stenosed.  Post intervention, there is a 0% residual stenosis.  LV end diastolic pressure is normal.  There is no aortic valve stenosis.  Continue clopidogrel along with aggressive secondary prevention.   Dialysis later today.     Consultations:  Cardiology  Nephrology  Discharge Exam: Vitals:   08/10/18 0610 08/10/18 0900  BP:  (!) 100/49  Pulse:    Resp:    Temp: 99.1 F (37.3 C)   SpO2:      General: Awake alert pleasant arcus senilis no pallor no icterus coherent Walked around with exercise therapist but felt a little tired Cardiovascular: S1-S2 no murmur on monitor she is in sinus rhythm Respiratory: Clinically clear no added sound Abdomen soft Groin soft no bleeding No lower extremity edema Cranial nerves intact  Discharge Instructions   Discharge Instructions    Amb Referral to Cardiac Rehabilitation   Complete by: As directed    To [place]   Diagnosis:  Coronary Stents NSTEMI     After initial evaluation and assessments completed: Virtual Based  Care may be provided alone or in conjunction with Phase 2 Cardiac Rehab based on patient barriers.: Yes   Diet - low sodium heart healthy   Complete by: As directed    Discharge instructions   Complete by: As directed    Please look at your medications carefully noticed  changes to your dosage of atorvastatin, aspirin, change in formulation and dosing of your metoprolol addition of Plavix etc.  You will need to follow-up closely-we have recommended that you get iron studies as well as blood counts in the next 1 to 2 weeks and I will alert your nephrologist to this and CCed her on this note to check your iron levels given you will be on multiple blood thinners I would recommend that you also continue cardiac rehab because of your stent placement   Increase activity slowly   Complete by: As directed      Allergies as of 08/10/2018      Reactions   Hydrochlorothiazide Other (See Comments)   Unknown [facility] records      Medication List    STOP taking these medications   aspirin 325 MG tablet Replaced by: aspirin 81 MG chewable tablet     TAKE these medications   aspirin 81 MG chewable tablet Chew 1 tablet (81 mg total) by mouth daily. Start taking on: August 11, 2018 Replaces: aspirin 325 MG tablet   atorvastatin 80 MG tablet Commonly known as: LIPITOR Take 1 tablet (80 mg total) by mouth daily at 6 PM. What changed:   medication strength  how much to take  when to take this  additional instructions   cinacalcet 30 MG tablet Commonly known as: SENSIPAR Take 30 mg by mouth daily.   clopidogrel 75 MG tablet Commonly known as: PLAVIX Take 1 tablet (75 mg total) by mouth daily with breakfast. Start taking on: August 11, 2018   insulin NPH-regular Human (70-30) 100 UNIT/ML injection Inject 20 Units into the skin 2 (two) times daily as needed (CBG >100).   metoprolol succinate 25 MG 24 hr tablet Commonly known as: TOPROL-XL Take 1 tablet (25 mg total) by mouth See admin instructions. Take 1/2 tablet (25 mg) on non dialysis days -  Sunday, Monday, Wednesday, Friday. Take with or immediately following a meal. What changed:   medication strength  how much to take  additional instructions   nitroGLYCERIN 0.4 MG SL tablet Commonly known as:  NITROSTAT Place 1 tablet (0.4 mg total) under the tongue every 5 (five) minutes x 3 doses as needed for chest pain.   PROTEIN PO Take 237 mLs by mouth daily.   sevelamer carbonate 800 MG tablet Commonly known as: RENVELA Take 1,600 mg by mouth 3 (three) times daily with meals.      Allergies  Allergen Reactions  . Hydrochlorothiazide Other (See Comments)    Unknown per [facility] records      The results of significant diagnostics from this hospitalization (including imaging, microbiology, ancillary and laboratory) are listed below for reference.    Significant Diagnostic Studies: Dg Chest Port 1 View  Result Date: 08/06/2018 CLINICAL DATA:  Chest pain EXAM: PORTABLE CHEST 1 VIEW COMPARISON:  02/21/2017 FINDINGS: Mild cardiomegaly with vascular congestion. No confluent opacities or effusions. Mild hyperinflation of the lungs. No overt edema, confluent opacities or effusions. No acute bony abnormality. IMPRESSION: Borderline cardiomegaly.  Mild vascular congestion. Hyperinflation. Electronically Signed   By: Rolm Baptise M.D.   On: 08/06/2018 19:30    Microbiology: Recent Results (  from the past 240 hour(s))  SARS Coronavirus 2 (Hosp order,Performed in [facility] lab via [employer] ID)     Status: None   Collection Time: 08/06/18  7:02 PM   Specimen: Dry Nasal Swab (A[employer] ID Now)  Result Value Ref Range Status   SARS Coronavirus 2 (Abbott ID Now) NEGATIVE NEGATIVE Final    Comment: (NOTE) SARS-CoV-2 target nucleic acids are NOT DETECTED. The SARS-CoV-2 RNA is generally detectable in upper and lower respiratory specimens during the acute phase of infection.  Negativeresults do not preclude SARS-CoV-2 infection, do not rule out coinfections with other pathogens, and should not be used as the  sole basis for treatment or other patient management decisions.  Negative results must be combined with clinical observations, patient history, and epidemiological information. The expected result  is Negative. Fact Sheet for Patients: GolfingFamily.no Fact Sheet for Healthcare Providers: https://www.hernandez-brewer.com/ This test is not yet approved or cleared by the [place] FDA and  has been authorized for detection and/or diagnosis of SARS-CoV-2 by FDA under an Emergency Use Authorization (EUA).  This EUA will remain in effect (meaning this test can be used) for the duration of  the COVID19 declaration under Section 5 64(b)(1) of the Act, 21 U.S.C.  section [phone] 3(b)(1), unless the authorization is terminated or revoked sooner. Performed at [facility], [address] [place], [place] [address]   MRSA PCR Screening     Status: None   Collection Time: 08/07/18 12:43 AM   Specimen: Nasal Mucosa; Nasopharyngeal  Result Value Ref Range Status   MRSA by PCR NEGATIVE NEGATIVE Final    Comment:        The GeneXpert MRSA Assay (FDA approved for NASAL specimens only), is one component of a comprehensive MRSA colonization surveillance program. It is not intended to diagnose MRSA infection nor to guide or monitor treatment for MRSA infections. Performed at [facility], [place] [address] [place], [place] [address]      Labs: Basic Metabolic Panel: Recent Labs  Lab 08/07/18 0238 08/08/18 0005 08/08/18 0539 08/09/18 0354 08/09/18 1639 08/10/18 0443  NA 130* 131* 132* 130*  --  135  K 4.2 4.4 3.6 4.0  --  4.4  CL 90* 91* 89* 90*  --  96*  CO2 25 19* 24 23  --  19*  GLUCOSE 113* 101* 101* 98  --  122*  BUN 57* 71* 23 43*  --  25*  CREATININE 10.03* 11.75* 5.40* 7.94* 7.17* 5.74*  CALCIUM 8.3* 8.5* 8.9 8.6*  --  8.8*  PHOS  --  6.1*  --   --   --  4.3   Liver Function Tests: Recent Labs  Lab 08/06/18 1854 08/08/18 0005 08/10/18 0443  AST 32  --   --   ALT 27  --   --   ALKPHOS 70  --   --   BILITOT 0.7  --   --   PROT 7.7  --   --   ALBUMIN 3.5 2.7* 2.7*   No results for input(s): LIPASE, AMYLASE  in the last 168 hours. No results for input(s): AMMONIA in the last 168 hours. CBC: Recent Labs  Lab 08/06/18 1854  08/08/18 0005 08/08/18 0539 08/09/18 0354 08/09/18 1639 08/10/18 0443  WBC 9.3   < > 7.1 7.3 7.0 6.6 8.4  NEUTROABS 7.3  --   --   --   --   --   --   HGB 7.7*   < >  6.9* 9.2* 8.5* 7.7* 7.8*  HCT 23.4*   < > 20.1* 26.3* 24.6* 22.3* 23.2*  MCV 98.7   < > 95.3 93.6 93.5 93.7 96.3  PLT 209   < > 222 223 229 215 240   < > = values in this interval not displayed.   Cardiac Enzymes: No results for input(s): CKTOTAL, CKMB, CKMBINDEX, TROPONINI in the last 168 hours. BNP: BNP (last 3 results) Recent Labs    08/06/18 1854  BNP 662.8*    ProBNP (last 3 results) No results for input(s): PROBNP in the last 8760 hours.  CBG: Recent Labs  Lab 08/09/18 0715 08/09/18 1236 08/09/18 1621 08/09/18 2114 08/10/18 0738  GLUCAP 130* 123* 111* 86 119*       Signed:  [name] MD   [facility] 08/10/2018, 11:17 AM

## 2018-08-16 ENCOUNTER — Telehealth: Payer: Self-pay | Admitting: Cardiology

## 2018-08-16 NOTE — Telephone Encounter (Signed)

## 2018-08-17 ENCOUNTER — Other Ambulatory Visit: Payer: Self-pay

## 2018-08-17 ENCOUNTER — Ambulatory Visit (INDEPENDENT_AMBULATORY_CARE_PROVIDER_SITE_OTHER): Payer: Medicare Other | Admitting: Physician Assistant

## 2018-08-17 ENCOUNTER — Encounter: Payer: Self-pay | Admitting: Physician Assistant

## 2018-08-17 VITALS — BP 116/62 | HR 81 | Ht 68.0 in | Wt 163.0 lb

## 2018-08-17 DIAGNOSIS — E0800 Diabetes mellitus due to underlying condition with hyperosmolarity without nonketotic hyperglycemic-hyperosmolar coma (NKHHC): Secondary | ICD-10-CM

## 2018-08-17 DIAGNOSIS — R911 Solitary pulmonary nodule: Secondary | ICD-10-CM

## 2018-08-17 DIAGNOSIS — N186 End stage renal disease: Secondary | ICD-10-CM | POA: Diagnosis not present

## 2018-08-17 DIAGNOSIS — I25110 Atherosclerotic heart disease of native coronary artery with unstable angina pectoris: Secondary | ICD-10-CM

## 2018-08-17 DIAGNOSIS — I214 Non-ST elevation (NSTEMI) myocardial infarction: Secondary | ICD-10-CM

## 2018-08-17 DIAGNOSIS — Z794 Long term (current) use of insulin: Secondary | ICD-10-CM

## 2018-08-17 DIAGNOSIS — I5042 Chronic combined systolic (congestive) and diastolic (congestive) heart failure: Secondary | ICD-10-CM

## 2018-08-17 DIAGNOSIS — Z992 Dependence on renal dialysis: Secondary | ICD-10-CM

## 2018-08-17 NOTE — Patient Instructions (Signed)
Medication Instructions:  Your physician recommends that you continue on your current medications as directed. Please refer to the Current Medication list given to you today.  If you need a refill on your cardiac medications before your next appointment, please call your pharmacy.   Lab work: None ordered  If you have labs (blood work) drawn today and your tests are completely normal, you will receive your results only by: Marland Kitchen MyChart Message (if you have MyChart) OR . A paper copy in the mail If you have any lab test that is abnormal or we need to change your treatment, we will call you to review the results.  Testing/Procedures: None ordered  Follow-Up: At [facility], you and your health needs are our priority.  As part of our continuing mission to provide you with exceptional heart care, we have created designated Provider Care Teams.  These Care Teams include your primary Cardiologist (physician) and Advanced Practice Providers (APPs -  Physician Assistants and Nurse Practitioners) who all work together to provide you with the care you need, when you need it. You will need a follow up appointment in 3 months.  Please call our office 2 months in advance to schedule this appointment.  You may see  or one of the following Advanced Practice Providers on your designated Care Team:   Truitt Merle, NP Cecilie Kicks, NP . Kathyrn Drown, NP  Any Other Special Instructions Will Be Listed Below (If Applicable).

## 2018-08-17 NOTE — Progress Notes (Signed)
Cardiology Office Note    Date:  08/17/2018   ID:  Nicholas [name], DOB July 10, 1930, MRN [id number]  PCP:  Carol Ada, MD  Cardiologist:  Dr. Tamala Julian (previously followed by Dr. Roseanne Reno at [facility]) Chief Complaint: Hospital follow up   History of Present Illness:   Nicholas [name] is a 85 y.o. male CAD, chronic systolic CHF, ESRD on HD TTS, IDT2DM, HTN, HLD, anemia of chronic disease requiring transfusion presents for hospital follow up.   Nicholas [name] is followed by [facility]. Per notes in Care Everywhere, he has a h/o nonobstructive CAD by cardiac cath years back and was treated medically. He had a stress test prior to undergoing rt nephrectomy for renal cell tumor at the [facility] in 2018 that was normal. He started HD in Dec 2018 after surgery.    Admitted 07/2018 with anterior NSTEMI. Cath showed high-grade LAD in setting of severe three-vessel coronary disease with total occlusion of RCA and the dominant obtuse marginal of the circumflex. The patient decided against consideration of bypass surgery. After long discussion with patient, wife and son he underwent atherectomy and PCI with DES to pLAD. 1dt dig jailed by stent. Continue ASA, Plavix, statin and metoprolol. Echo showed LVEF of 35-40% with Wm abnormality. Continue low dose BB. No ACE/ARB due to ESRD. Volume managed by dialysis. Transfused for low hemoglobin. Per Dr. Tamala Julian " If significant bleeding we may have to sacrifice aspirin and use monotherapy with clopidogrel.  Getting beyond 3 months on dual antiplatelet therapy is key, if possible".  Here today for follow up with wife.  Patient was transfused during dialysis yesterday.  Unknown hemoglobin level.  He denies chest pain, shortness of breath, palpitation, dizziness, orthopnea, PND, syncope.  He is compliant with his medications and low-sodium diet.   Past Medical History:  Diagnosis Date  . Arthritis   . Chronic kidney disease   . Diabetes mellitus ([place])   . Dialysis patient  ([place])   . GERD (gastroesophageal reflux disease)    occ  . Heart murmur   . Hypercholesteremia   . Hypertension   . Psoriasis     Past Surgical History:  Procedure Laterality Date  . CARDIAC CATHETERIZATION  09   "some blockage" with collaterals by cath at [facility] ~ 2009; no intervention required; reportedly, no routine cardiology f/u recommended   . CORONARY ATHERECTOMY N/A 08/09/2018   Procedure: CORONARY ATHERECTOMY;  Surgeon: Jettie Booze, MD;  Location: [place] [facility];  Service: Cardiovascular;  Laterality: N/A;  . CORONARY STENT INTERVENTION N/A 08/09/2018   Procedure: CORONARY STENT INTERVENTION;  Surgeon: Jettie Booze, MD;  Location: [place];  Service: Cardiovascular;  Laterality: N/A;  . INGUINAL HERNIA REPAIR Right 04/13/2012   Procedure: HERNIA REPAIR INGUINAL INCARCERATED;  Surgeon: Gayland Curry, MD;  Location: [place];  Service: General;  Laterality: Right;  . INSERTION OF MESH Right 04/13/2012   Procedure: INSERTION OF MESH;  Surgeon: Gayland Curry, MD;  Location: [place];  Service: General;  Laterality: Right;  . LEFT HEART CATH N/A 08/09/2018   Procedure: Left Heart Cath;  Surgeon: Jettie Booze, MD;  Location: [place] [facility];  Service: Cardiovascular;  Laterality: N/A;  . LEFT HEART CATH AND CORONARY ANGIOGRAPHY N/A 08/08/2018   Procedure: LEFT HEART CATH AND CORONARY ANGIOGRAPHY;  Surgeon: Troy Sine, MD;  Location: [place] [facility];  Service: Cardiovascular;  Laterality: N/A;  . VASECTOMY      Current Medications: Prior  to Admission medications   Medication Sig Start Date End Date Taking? Authorizing Provider  aspirin 81 MG chewable tablet Chew 1 tablet (81 mg total) by mouth daily. 08/11/18  Yes [name], MD  atorvastatin (LIPITOR) 80 MG tablet Take 1 tablet (80 mg total) by mouth daily at 6 PM. 08/10/18  Yes [name], MD  cinacalcet (SENSIPAR) 30 MG tablet Take 30 mg by mouth daily.   Yes [provider]  clopidogrel (PLAVIX) 75 MG tablet Take 1 tablet (75 mg total) by mouth daily with breakfast. 08/11/18  Yes [name], MD  insulin NPH-insulin regular (NOVOLIN 70/30) (70-30) 100 UNIT/ML injection Inject 20 Units into the skin 2 (two) times daily as needed (CBG >100).    Yes [provider]  metoprolol succinate (TOPROL-XL) 25 MG 24 hr tablet Take 1 tablet (25 mg total) by mouth See admin instructions. Take 1/2 tablet (25 mg) on non dialysis days -  Sunday, Monday, Wednesday, Friday. Take with or immediately following a meal. 08/10/18  Yes [name], MD  nitroGLYCERIN (NITROSTAT) 0.4 MG SL tablet Place 1 tablet (0.4 mg total) under the tongue every 5 (five) minutes x 3 doses as needed for chest pain. 08/10/18  Yes [name], MD  PROTEIN PO Take 237 mLs by mouth daily.   Yes [provider]  sevelamer carbonate (RENVELA) 800 MG tablet Take 1,600 mg by mouth 3 (three) times daily with meals.  05/09/18  Yes [provider]    Allergies:   Hydrochlorothiazide   Social History   Socioeconomic History  . Marital status: Married    Spouse name: Not on file  . Number of children: Not on file  . Years of education: Not on file  . Highest education level: Not on file  Occupational History  . Not on file  Social Needs  . Financial resource strain: Not on file  . Food insecurity    Worry: Not on file    Inability: Not on file  . Transportation needs    Medical: Not on file    Non-medical: Not on file  Tobacco Use  . Smoking status: Former Smoker    Packs/day: 1.00    Years: 15.00    Pack years: 15.00    Types: Cigarettes    Quit date: 03/29/1987    Years since quitting: 31.4  . Smokeless tobacco: Never Used  Substance and Sexual Activity  . Alcohol use: No  . Drug use: No  . Sexual activity: Not on file  Lifestyle  . Physical activity    Days per week: Not on file    Minutes per session: Not on file  . Stress: Not  on file  Relationships  . Social Herbalist on phone: Not on file    Gets together: Not on file    Attends religious service: Not on file    Active member of club or organization: Not on file    Attends meetings of clubs or organizations: Not on file    Relationship status: Not on file  Other Topics Concern  . Not on file  Social History Narrative  . Not on file     Family History:  The patient's family history includes Diabetes in his brother, mother, and sister; Stroke in his father.  ROS:   Please see the history of present illness.    ROS All other systems reviewed and are negative.   PHYSICAL EXAM:   VS:  BP 116/62  Pulse 81   Ht 5\' 8"  (1.727 m)   Wt 163 lb (73.9 kg)   BMI 24.78 kg/m    GEN: Well nourished, well developed, in no acute distress  HEENT: normal  Neck: no JVD, carotid bruits, or masses Cardiac: RRR; no murmurs, rubs, or gallops,no edema  Respiratory:  clear to auscultation bilaterally, normal work of breathing GI: soft, nontender, nondistended, + BS MS: no deformity or atrophy  Skin: warm and dry, no rash Neuro:  Alert and Oriented x 3, Strength and sensation are intact Psych: euthymic mood, full affect  Wt Readings from Last 3 Encounters:  08/17/18 163 lb (73.9 kg)  08/10/18 153 lb 14.4 oz (69.8 kg)  10/03/14 180 lb (81.6 kg)      Studies/Labs Reviewed:   EKG:  EKG is ordered today.  The ekg ordered today demonstrates SR at rate of 81 bpm  Recent Labs: 08/06/2018: ALT 27; B Natriuretic Peptide 662.8 08/10/2018: BUN 25; Creatinine, Ser 5.74; Hemoglobin 7.8; Platelets 240; Potassium 4.4; Sodium 135   Lipid Panel    Component Value Date/Time   CHOL 202 (H) 08/06/2018 1854   TRIG 200 (H) 08/06/2018 1854   HDL 68 08/06/2018 1854   CHOLHDL 3.0 08/06/2018 1854   VLDL 40 08/06/2018 1854   LDLCALC NOT CALCULATED 08/06/2018 1854    Additional studies/ records that were reviewed today include:   CORONARY STENT INTERVENTION   CORONARY ATHERECTOMY  Left Heart Cath  Conclusion    Prox LAD lesion is 90% stenosed.  A drug-eluting stent was successfully placed using a STENT SYNERGY DES 3.5X16.  1st Diag lesion is 80% stenosed and jailed by the stent. TIMIT 1 flow.  1st Mrg lesion is 100% stenosed.  Prox Cx lesion is 80% stenosed.  Post intervention, there is a 0% residual stenosis.  LV end diastolic pressure is normal.  There is no aortic valve stenosis.  Continue clopidogrel along with aggressive secondary prevention.    Diagnostic Dominance: Right  Intervention   2D Doppler echocardiogram performed on 08/07/2018  IMPRESSIONS   1. The left ventricle has moderately reduced systolic function, with an ejection fraction of 35-40%. The cavity size was normal. There is mild asymmetric left ventricular hypertrophy of the basal anteroseptal wall. Left ventricular diastolic Doppler  parameters are consistent with pseudonormalization. Elevated left atrial and left ventricular end-diastolic pressures The E/e' is >25. 2. Moderate hypokinesis of the left ventricular, mid-apical anteroseptal wall, anterior segment, anterior wall and inferoapical. 3. The right ventricle has normal systolic function. The cavity was normal. There is no increase in right ventricular wall thickness. 4. The mitral valve is abnormal. Mild thickening of the mitral valve leaflet. There is mild mitral annular calcification present. Mitral valve regurgitation is mild to moderate by color flow Doppler. 5. The tricuspid valve is abnormal. 6. The aortic valve is tricuspid. Mild calcification of the aortic valve. No stenosis of the aortic valve.  Coronary angiography 08/08/2018: Diagnostic Dominance: Right      ASSESSMENT & PLAN:    1. CAD  Recent cath as above. Declined CABG. S/p atherectomy and DES to pLAD. - Continue ASA, Plavix, statin and BB.   2. Chronic combined CHF - LVEF of 35-40% with Wm abnormality.  Continue low dose BB. No ACE/ARB due to ESRD. Volume managed by dialysis  3. Anemia of CKD - He was transfused yesterday during dialysis.  Per Dr. Tamala Julian " If significant bleeding we may have to sacrifice aspirin and use monotherapy with clopidogrel.  Getting beyond  3 months on dual antiplatelet therapy is key, if possible".  4. HLD 08/06/2018: Cholesterol 202; HDL 68; LDL Cholesterol NOT CALCULATED; Triglycerides 200; VLDL 40  - Continue high intensity statin.  - He will get Lipid panel and [facility]  5. ESRD on HD  Medication Adjustments/Labs and Tests Ordered: Current medicines are reviewed at length with the patient today.  Concerns regarding medicines are outlined above.  Medication changes, Labs and Tests ordered today are listed in the Patient Instructions below. Patient Instructions  Medication Instructions:  Your physician recommends that you continue on your current medications as directed. Please refer to the Current Medication list given to you today.  If you need a refill on your cardiac medications before your next appointment, please call your pharmacy.   Lab work: None ordered  If you have labs (blood work) drawn today and your tests are completely normal, you will receive your results only by: Marland Kitchen MyChart Message (if you have MyChart) OR . A paper copy in the mail If you have any lab test that is abnormal or we need to change your treatment, we will call you to review the results.  Testing/Procedures: None ordered  Follow-Up: At [facility], you and your health needs are our priority.  As part of our continuing mission to provide you with exceptional heart care, we have created designated Provider Care Teams.  These Care Teams include your primary Cardiologist (physician) and Advanced Practice Providers (APPs -  Physician Assistants and Nurse Practitioners) who all work together to provide you with the care you need, when you need it. You will need a follow up  appointment in 3 months.  Please call our office 2 months in advance to schedule this appointment.  You may see  or one of the following Advanced Practice Providers on your designated Care Team:   Truitt Merle, NP Cecilie Kicks, NP . Kathyrn Drown, NP  Any Other Special Instructions Will Be Listed Below (If Applicable).       Jarrett Soho, [place]  08/17/2018 2:49 PM    [facility] [place], [place], [place]  [address] Phone: [phone]; Fax: [phone]

## 2018-08-20 ENCOUNTER — Telehealth: Payer: Self-pay | Admitting: *Deleted

## 2018-08-20 NOTE — Telephone Encounter (Signed)
Called pt re: follow-up appointment with Dr. Tamala Julian in 3 months.  Dr. Thompson Caul schedule is open, if pt calls, please schedule a follow-up appointment. I left the pt a message to call back.

## 2018-08-20 NOTE — Telephone Encounter (Signed)
-----   Message from Loren Racer, RN sent at 08/20/2018  8:11 AM EDT ----- Regarding: RE: 3 mo f/u His October schedule is open  ----- Message ----- From: Jeanann Lewandowsky, RMA Sent: 08/17/2018   2:46 PM EDT To: Loren Racer, RN Subject: 3 mo f/u                                       Pt needs 3 mo f/u with dr. Tamala Julian please

## 2018-08-22 DIAGNOSIS — M25511 Pain in right shoulder: Secondary | ICD-10-CM | POA: Diagnosis not present

## 2018-08-22 DIAGNOSIS — M24811 Other specific joint derangements of right shoulder, not elsewhere classified: Secondary | ICD-10-CM | POA: Diagnosis not present

## 2018-08-30 ENCOUNTER — Telehealth: Payer: Self-pay | Admitting: Interventional Cardiology

## 2018-08-30 NOTE — Telephone Encounter (Signed)
New message   Patient's wife states that he is having a nose bleed and there is a lot of clotting. Please call to discuss because the patient is on blood thinner.

## 2018-08-30 NOTE — Telephone Encounter (Signed)
Spoke with wife and made her aware of recommendations.  Wife verbalized understanding and was in agreement with plan.

## 2018-08-30 NOTE — Telephone Encounter (Signed)
Spoke with pt and obtained permission to speak with wife. Wife states last night and this morning pt had nose bleeds.  She feels like it was a heavy amount, called it a "handful".  Did notice some clots during the bleed.  Pt able to get the bleeding stopped fairly quickly.  Stent was placed 08/08/2018.  This has only occurred these two times.  Inquired about dryness and allergies.  Wife states it probably could be that because pt has had a slight, dry cough as well.  Pt does not use nasal sprays to keep his nasal passages moisturized.  Advised wife this may help but I will also send message to Dr. Tamala Julian and Robbie Lis, PA-C since he seen pt recently to see about other recommendations.  I did advise to continue ASA and Plavix because of recent stent.  Wife asked that we call her cell because she will be taking pt to dialysis soon [phone].

## 2018-08-30 NOTE — Telephone Encounter (Signed)
Too soon to stop aspirin. Please monitor and let us know if it continues.

## 2018-09-19 DIAGNOSIS — B351 Tinea unguium: Secondary | ICD-10-CM | POA: Diagnosis not present

## 2018-09-19 DIAGNOSIS — L84 Corns and callosities: Secondary | ICD-10-CM | POA: Diagnosis not present

## 2018-09-19 DIAGNOSIS — E1159 Type 2 diabetes mellitus with other circulatory complications: Secondary | ICD-10-CM | POA: Diagnosis not present

## 2018-09-27 ENCOUNTER — Encounter (HOSPITAL_COMMUNITY): Payer: Self-pay

## 2018-09-27 ENCOUNTER — Other Ambulatory Visit: Payer: Self-pay

## 2018-09-27 ENCOUNTER — Other Ambulatory Visit: Payer: Self-pay | Admitting: *Deleted

## 2018-09-27 ENCOUNTER — Inpatient Hospital Stay (HOSPITAL_COMMUNITY)
Admission: EM | Admit: 2018-09-27 | Discharge: 2018-10-04 | DRG: 377 | Disposition: A | Discharge status: Home Health | Payer: Medicare Other | Attending: Internal Medicine | Admitting: Internal Medicine

## 2018-09-27 ENCOUNTER — Telehealth: Payer: Self-pay | Admitting: Interventional Cardiology

## 2018-09-27 DIAGNOSIS — E8889 Other specified metabolic disorders: Secondary | ICD-10-CM | POA: Diagnosis present

## 2018-09-27 DIAGNOSIS — I255 Ischemic cardiomyopathy: Secondary | ICD-10-CM | POA: Diagnosis present

## 2018-09-27 DIAGNOSIS — E875 Hyperkalemia: Secondary | ICD-10-CM | POA: Diagnosis present

## 2018-09-27 DIAGNOSIS — N2581 Secondary hyperparathyroidism of renal origin: Secondary | ICD-10-CM | POA: Diagnosis present

## 2018-09-27 DIAGNOSIS — D500 Iron deficiency anemia secondary to blood loss (chronic): Secondary | ICD-10-CM | POA: Diagnosis not present

## 2018-09-27 DIAGNOSIS — Z992 Dependence on renal dialysis: Secondary | ICD-10-CM | POA: Diagnosis not present

## 2018-09-27 DIAGNOSIS — I2510 Atherosclerotic heart disease of native coronary artery without angina pectoris: Secondary | ICD-10-CM | POA: Diagnosis present

## 2018-09-27 DIAGNOSIS — D649 Anemia, unspecified: Secondary | ICD-10-CM | POA: Diagnosis present

## 2018-09-27 DIAGNOSIS — Z794 Long term (current) use of insulin: Secondary | ICD-10-CM

## 2018-09-27 DIAGNOSIS — I252 Old myocardial infarction: Secondary | ICD-10-CM

## 2018-09-27 DIAGNOSIS — E1129 Type 2 diabetes mellitus with other diabetic kidney complication: Secondary | ICD-10-CM | POA: Diagnosis present

## 2018-09-27 DIAGNOSIS — Z20828 Contact with and (suspected) exposure to other viral communicable diseases: Secondary | ICD-10-CM | POA: Diagnosis present

## 2018-09-27 DIAGNOSIS — I5042 Chronic combined systolic (congestive) and diastolic (congestive) heart failure: Secondary | ICD-10-CM | POA: Diagnosis not present

## 2018-09-27 DIAGNOSIS — I132 Hypertensive heart and chronic kidney disease with heart failure and with stage 5 chronic kidney disease, or end stage renal disease: Secondary | ICD-10-CM | POA: Diagnosis present

## 2018-09-27 DIAGNOSIS — K922 Gastrointestinal hemorrhage, unspecified: Secondary | ICD-10-CM | POA: Diagnosis present

## 2018-09-27 DIAGNOSIS — E7800 Pure hypercholesterolemia, unspecified: Secondary | ICD-10-CM | POA: Diagnosis present

## 2018-09-27 DIAGNOSIS — N186 End stage renal disease: Secondary | ICD-10-CM | POA: Diagnosis not present

## 2018-09-27 DIAGNOSIS — I120 Hypertensive chronic kidney disease with stage 5 chronic kidney disease or end stage renal disease: Secondary | ICD-10-CM | POA: Diagnosis not present

## 2018-09-27 DIAGNOSIS — I10 Essential (primary) hypertension: Secondary | ICD-10-CM | POA: Diagnosis not present

## 2018-09-27 DIAGNOSIS — Z7982 Long term (current) use of aspirin: Secondary | ICD-10-CM

## 2018-09-27 DIAGNOSIS — Z03818 Encounter for observation for suspected exposure to other biological agents ruled out: Secondary | ICD-10-CM | POA: Diagnosis not present

## 2018-09-27 DIAGNOSIS — E785 Hyperlipidemia, unspecified: Secondary | ICD-10-CM | POA: Diagnosis present

## 2018-09-27 DIAGNOSIS — K219 Gastro-esophageal reflux disease without esophagitis: Secondary | ICD-10-CM | POA: Diagnosis not present

## 2018-09-27 DIAGNOSIS — E1122 Type 2 diabetes mellitus with diabetic chronic kidney disease: Secondary | ICD-10-CM | POA: Diagnosis present

## 2018-09-27 DIAGNOSIS — Z79899 Other long term (current) drug therapy: Secondary | ICD-10-CM

## 2018-09-27 DIAGNOSIS — Z87891 Personal history of nicotine dependence: Secondary | ICD-10-CM

## 2018-09-27 DIAGNOSIS — Z7902 Long term (current) use of antithrombotics/antiplatelets: Secondary | ICD-10-CM

## 2018-09-27 DIAGNOSIS — Z955 Presence of coronary angioplasty implant and graft: Secondary | ICD-10-CM

## 2018-09-27 DIAGNOSIS — K920 Hematemesis: Secondary | ICD-10-CM | POA: Diagnosis not present

## 2018-09-27 DIAGNOSIS — Z833 Family history of diabetes mellitus: Secondary | ICD-10-CM

## 2018-09-27 DIAGNOSIS — D638 Anemia in other chronic diseases classified elsewhere: Secondary | ICD-10-CM | POA: Diagnosis present

## 2018-09-27 DIAGNOSIS — I5022 Chronic systolic (congestive) heart failure: Secondary | ICD-10-CM | POA: Diagnosis present

## 2018-09-27 DIAGNOSIS — K5791 Diverticulosis of intestine, part unspecified, without perforation or abscess with bleeding: Principal | ICD-10-CM | POA: Diagnosis present

## 2018-09-27 LAB — PROTIME-INR
INR: 1 (ref 0.8–1.2)
Prothrombin Time: 13.2 seconds (ref 11.4–15.2)

## 2018-09-27 LAB — COMPREHENSIVE METABOLIC PANEL
ALT: 18 U/L (ref 0–44)
AST: 21 U/L (ref 15–41)
Albumin: 2.9 g/dL — ABNORMAL LOW (ref 3.5–5.0)
Alkaline Phosphatase: 70 U/L (ref 38–126)
Anion gap: 15 (ref 5–15)
BUN: 28 mg/dL — ABNORMAL HIGH (ref 8–23)
CO2: 25 mmol/L (ref 22–32)
Calcium: 8 mg/dL — ABNORMAL LOW (ref 8.9–10.3)
Chloride: 97 mmol/L — ABNORMAL LOW (ref 98–111)
Creatinine, Ser: 4.42 mg/dL — ABNORMAL HIGH (ref 0.61–1.24)
GFR calc Af Amer: 13 mL/min — ABNORMAL LOW (ref >60)
GFR calc non Af Amer: 11 mL/min — ABNORMAL LOW (ref >60)
Glucose, Bld: 105 mg/dL — ABNORMAL HIGH (ref 70–99)
Potassium: 4.3 mmol/L (ref 3.5–5.1)
Sodium: 137 mmol/L (ref 135–145)
Total Bilirubin: 0.2 mg/dL — ABNORMAL LOW (ref 0.3–1.2)
Total Protein: 6.1 g/dL — ABNORMAL LOW (ref 6.5–8.1)

## 2018-09-27 LAB — CBC WITH DIFFERENTIAL/PLATELET
Abs Immature Granulocytes: 0.04 10*3/uL (ref 0.00–0.07)
Basophils Absolute: 0 10*3/uL (ref 0.0–0.1)
Basophils Relative: 0 %
Eosinophils Absolute: 0.3 10*3/uL (ref 0.0–0.5)
Eosinophils Relative: 3 %
HCT: 15.8 % — ABNORMAL LOW (ref 39.0–52.0)
Hemoglobin: 5.1 g/dL — CL (ref 13.0–17.0)
Immature Granulocytes: 0 %
Lymphocytes Relative: 22 %
Lymphs Abs: 1.9 10*3/uL (ref 0.7–4.0)
MCH: 31.3 pg (ref 26.0–34.0)
MCHC: 32.3 g/dL (ref 30.0–36.0)
MCV: 96.9 fL (ref 80.0–100.0)
Monocytes Absolute: 0.8 10*3/uL (ref 0.1–1.0)
Monocytes Relative: 9 %
Neutro Abs: 5.9 10*3/uL (ref 1.7–7.7)
Neutrophils Relative %: 66 %
Platelets: 202 10*3/uL (ref 150–400)
RBC: 1.63 MIL/uL — ABNORMAL LOW (ref 4.22–5.81)
RDW: 15.3 % (ref 11.5–15.5)
WBC: 8.9 10*3/uL (ref 4.0–10.5)
nRBC: 0 % (ref 0.0–0.2)

## 2018-09-27 LAB — SARS CORONAVIRUS 2 BY RT PCR (HOSPITAL ORDER, PERFORMED IN ~~LOC~~ HOSPITAL LAB): SARS Coronavirus 2: NEGATIVE

## 2018-09-27 LAB — POC OCCULT BLOOD, ED: Fecal Occult Bld: POSITIVE — AB

## 2018-09-27 LAB — PREPARE RBC (CROSSMATCH)

## 2018-09-27 MED ORDER — SODIUM CHLORIDE 0.9 % IV SOLN
10.0000 mL/h | Freq: Once | INTRAVENOUS | Status: AC
  Administered 2018-09-27: 10 mL/h via INTRAVENOUS

## 2018-09-27 MED ORDER — PANTOPRAZOLE SODIUM 40 MG IV SOLR
40.0000 mg | Freq: Two times a day (BID) | INTRAVENOUS | Status: DC
  Administered 2018-09-27 – 2018-10-03 (×13): 40 mg via INTRAVENOUS
  Filled 2018-09-27 (×13): qty 40

## 2018-09-27 NOTE — ED Provider Notes (Signed)
[facility] EMERGENCY DEPARTMENT Provider Note   CSN: [id number] Arrival date & time: 09/27/18  1607     History   Chief Complaint Chief Complaint  Patient presents with  . GI Bleeding  . Abnormal Lab    HPI Nicholas [name] is a 85 y.o. male.     HPI Patient reports that he had some bright red blood mixed with stool today.  He reports it did not seem like it was very much.  He was not really that concerned about it.  He reports last week he also had a little bit of red blood.  He reports his bowel movements have otherwise been normal.  He has not felt any abdominal pain.  Is had no nausea no vomiting.  He has been going to dialysis as scheduled.  He went today and was told that his hemoglobin was low and he had to come to the emergency department.  He denies any history of significant gastrointestinal bleeding.  He reports he has had anemia and had to get transfused previously. Past Medical History:  Diagnosis Date  . Arthritis   . Chronic kidney disease   . Diabetes mellitus ([place])   . Dialysis patient ([place])   . GERD (gastroesophageal reflux disease)    occ  . Heart murmur   . Hypercholesteremia   . Hypertension   . Psoriasis     Patient Active Problem List   Diagnosis Date Noted  . NSTEMI (non-ST elevated myocardial infarction) ([place]) 08/06/2018  . Diabetes mellitus ([place])   . Hypertension   . Hypercholesteremia   . ESRD (end stage renal disease) on dialysis ([place])   . Anemia of chronic disease   . Lung nodule seen on imaging study 04/30/2012    Past Surgical History:  Procedure Laterality Date  . CARDIAC CATHETERIZATION  09   "some blockage" with collaterals by cath at [facility] ~ 2009; no intervention required; reportedly, no routine cardiology f/u recommended   . CORONARY ATHERECTOMY N/A 08/09/2018   Procedure: CORONARY ATHERECTOMY;  Surgeon: Jettie Booze, MD;  Location: [facility] CV [facility];  Service: Cardiovascular;  Laterality: N/A;  . CORONARY  STENT INTERVENTION N/A 08/09/2018   Procedure: CORONARY STENT INTERVENTION;  Surgeon: Jettie Booze, MD;  Location: [facility];  Service: Cardiovascular;  Laterality: N/A;  . INGUINAL HERNIA REPAIR Right 04/13/2012   Procedure: HERNIA REPAIR INGUINAL INCARCERATED;  Surgeon: Gayland Curry, MD;  Location: [facility];  Service: General;  Laterality: Right;  . INSERTION OF MESH Right 04/13/2012   Procedure: INSERTION OF MESH;  Surgeon: Gayland Curry, MD;  Location: [facility];  Service: General;  Laterality: Right;  . LEFT HEART CATH N/A 08/09/2018   Procedure: Left Heart Cath;  Surgeon: Jettie Booze, MD;  Location: [facility];  Service: Cardiovascular;  Laterality: N/A;  . LEFT HEART CATH AND CORONARY ANGIOGRAPHY N/A 08/08/2018   Procedure: LEFT HEART CATH AND CORONARY ANGIOGRAPHY;  Surgeon: Troy Sine, MD;  Location: [facility];  Service: Cardiovascular;  Laterality: N/A;  . VASECTOMY          Home Medications    Prior to Admission medications   Medication Sig Start Date End Date Taking? Authorizing Provider  aspirin 81 MG chewable tablet Chew 1 tablet (81 mg total) by mouth daily. 08/11/18  Yes [name], MD  atorvastatin (LIPITOR) 80 MG tablet Take 1 tablet (80 mg total) by mouth daily at 6 PM. 08/10/18  Yes  [name], MD  cinacalcet (SENSIPAR) 30 MG tablet Take 30 mg by mouth daily.   Yes [provider]  clopidogrel (PLAVIX) 75 MG tablet Take 1 tablet (75 mg total) by mouth daily with breakfast. 08/11/18  Yes [name], MD  insulin NPH-insulin regular (NOVOLIN 70/30) (70-30) 100 UNIT/ML injection Inject 20 Units into the skin 2 (two) times daily as needed (CBG >100).    Yes [provider]  metoprolol succinate (TOPROL-XL) 25 MG 24 hr tablet Take 1 tablet (25 mg total) by mouth See admin instructions. Take 1/2 tablet (25 mg) on non dialysis days -  Sunday, Monday, Wednesday, Friday. Take with or immediately  following a meal. 08/10/18  Yes [name], MD  nitroGLYCERIN (NITROSTAT) 0.4 MG SL tablet Place 1 tablet (0.4 mg total) under the tongue every 5 (five) minutes x 3 doses as needed for chest pain. 08/10/18  Yes [name], MD  PROTEIN PO Take 237 mLs by mouth daily.   Yes [provider]  sevelamer carbonate (RENVELA) 800 MG tablet Take 1,600 mg by mouth 3 (three) times daily with meals.  05/09/18  Yes [provider]    Family History Family History  Problem Relation Age of Onset  . Diabetes Mother   . Stroke Father   . Diabetes Sister   . Diabetes Brother     Social History Social History   Tobacco Use  . Smoking status: Former Smoker    Packs/day: 1.00    Years: 15.00    Pack years: 15.00    Types: Cigarettes    Quit date: 03/29/1987    Years since quitting: 31.5  . Smokeless tobacco: Never Used  Substance Use Topics  . Alcohol use: No  . Drug use: No     Allergies   Hydrochlorothiazide   Review of Systems Review of Systems 10 Systems reviewed and are negative for acute change except as noted in the HPI.  Physical Exam Updated Vital Signs BP (!) 108/55   Pulse 88   Temp 98.1 F (36.7 C) (Oral)   Resp 16   Ht 5\' 8"  (1.727 m)   Wt 72.6 kg   SpO2 100%   BMI 24.33 kg/m   Physical Exam Constitutional:      Appearance: Normal appearance.  HENT:     Head: Normocephalic and atraumatic.  Eyes:     Extraocular Movements: Extraocular movements intact.  Neck:     Musculoskeletal: Neck supple.  Cardiovascular:     Rate and Rhythm: Normal rate and regular rhythm.  Pulmonary:     Effort: Pulmonary effort is normal.     Breath sounds: Normal breath sounds.  Abdominal:     General: There is no distension.     Palpations: Abdomen is soft.     Tenderness: There is no abdominal tenderness. There is no guarding.  Musculoskeletal: Normal range of motion.        General: No swelling or tenderness.  Skin:    General: Skin is warm  and dry.  Neurological:     General: No focal deficit present.     Mental Status: He is alert and oriented to person, place, and time.     Coordination: Coordination normal.  Psychiatric:        Mood and Affect: Mood normal.      ED Treatments / Results  Labs (all labs ordered are listed, but only abnormal results are displayed) Labs Reviewed  COMPREHENSIVE METABOLIC PANEL - Abnormal; Notable for the  following components:      Result Value   Chloride 97 (*)    Glucose, Bld 105 (*)    BUN 28 (*)    Creatinine, Ser 4.42 (*)    Calcium 8.0 (*)    Total Protein 6.1 (*)    Albumin 2.9 (*)    Total Bilirubin 0.2 (*)    GFR calc non Af Amer 11 (*)    GFR calc Af Amer 13 (*)    All other components within normal limits  CBC WITH DIFFERENTIAL/PLATELET - Abnormal; Notable for the following components:   RBC 1.63 (*)    Hemoglobin 5.1 (*)    HCT 15.8 (*)    All other components within normal limits  POC OCCULT BLOOD, ED - Abnormal; Notable for the following components:   Fecal Occult Bld POSITIVE (*)    All other components within normal limits  PROTIME-INR  TYPE AND SCREEN  PREPARE RBC (CROSSMATCH)    EKG EKG Interpretation  Date/Time:  Thursday September 27 2018 16:16:25 EDT Ventricular Rate:  90 PR Interval:    QRS Duration: 99 QT Interval:  378 QTC Calculation: 463 R Axis:   -3 Text Interpretation:  Sinus rhythm Borderline low voltage, extremity leads Anteroseptal infarct, old no sig change from previous Confirmed by Charlesetta Shanks [phone]) on 09/27/2018 9:29:58 PM   Radiology No results found.  Procedures Procedures (including critical care time) CRITICAL CARE Performed by: Charlesetta Shanks   Total critical care time: 20  minutes  Critical care time was exclusive of separately billable procedures and treating other patients.  Critical care was necessary to treat or prevent imminent or life-threatening deterioration.  Critical care was time spent personally  by me on the following activities: development of treatment plan with patient and/or surrogate as well as nursing, discussions with consultants, evaluation of patient's response to treatment, examination of patient, obtaining history from patient or surrogate, ordering and performing treatments and interventions, ordering and review of laboratory studies, ordering and review of radiographic studies, pulse oximetry and re-evaluation of patient's condition. Medications Ordered in ED Medications  0.9 %  sodium chloride infusion (has no administration in time range)     Initial Impression / Assessment and Plan / ED Course  I have reviewed the triage vital signs and the nursing notes.  Pertinent labs & imaging results that were available during my care of the patient were reviewed by me and considered in my medical decision making (see chart for details).  Clinical Course as of Sep 26 2124  Thu Sep 27, 2018  2113 Consult:dr. [facility] for admission   [MP]    Clinical Course User Index [MP] Charlesetta Shanks, MD      Patient is alert and in no acute distress.  He did have identified significant anemia.  Patient has had some GI bleed but does not have active bleeding.  Patient has comorbid illness of dialysis.  Patient will need admission for blood replacement and monitoring for any sign of volume overload.   Final Clinical Impressions(s) / ED Diagnoses   Final diagnoses:  Symptomatic anemia  Lower GI bleed  ESRD (end stage renal disease) on dialysis [facility])    ED Discharge Orders    None       Charlesetta Shanks, MD 09/27/18 2131

## 2018-09-27 NOTE — Telephone Encounter (Signed)
Spoke with pt and wife,  Wife states this morning pt's stool was a little darker than normal.  Not tarry or black though.  Slight pink tint on toilet paper.  Denies hemorrhoid issue.  Pt receives iron during dialysis on Tu, Th, Sat.  Denies any visible blood on toilet paper or in toilet.  Advised to continue to monitor and I will send to Dr. Tamala Julian to see if any further recommendations.  Pt appreciative for call.

## 2018-09-27 NOTE — Telephone Encounter (Signed)
° ° ° °  Spouse calling to report dark stool No other symptoms

## 2018-09-27 NOTE — ED Triage Notes (Signed)
Pt BIB [facility] for eval of GI bleeding w/ low hgb. Pt was at dialysis today, and his PCP advised him that his hgb was 5 and he needed to come here for further eval. Pt reports 1 episode of melena a week ago and another episode of melena this AM, but reports normal BMs in between. Denies abd pain, N/V, or hx of same. T/Th/Sat dialysis

## 2018-09-27 NOTE — ED Notes (Signed)
ED TO INPATIENT HANDOFF REPORT  ED Nurse Name and Phone #: Annie Main [phone]  S Name/Age/Gender Nicholas [name] 85 y.o. male Room/Bed: 031C/031C  Code Status   Code Status: Prior  Home/SNF/Other Home Patient oriented to: self, place, time and situation Is this baseline? Yes   Triage Complete: Triage complete  Chief Complaint Hemodialysis  Triage Note Pt BIB [facility] for eval of GI bleeding w/ low hgb. Pt was at dialysis today, and his PCP advised him that his hgb was 5 and he needed to come here for further eval. Pt reports 1 episode of melena a week ago and another episode of melena this AM, but reports normal BMs in between. Denies abd pain, N/V, or hx of same. T/Th/Sat dialysis   Allergies Allergies  Allergen Reactions  . Hydrochlorothiazide Other (See Comments)    [facility]    Level of Care/Admitting Diagnosis ED Disposition    ED Disposition Condition [place]: [place] [100100]  Level of Care: Telemetry Medical [104]  I expect the patient will be discharged within 24 hours: No (not a candidate for 5C-Observation unit)  Covid Evaluation: Asymptomatic Screening Protocol (No Symptoms)  Diagnosis: GIB (gastrointestinal bleeding) [481856]  Admitting Physician: Ivor Costa [[id number]]  Attending Physician: Ivor Costa [[id number]]  PT Class (Do Not Modify): Observation [104]  PT Acc Code (Do Not Modify): Observation [10022]       B Medical/Surgery History Past Medical History:  Diagnosis Date  . Arthritis   . Chronic kidney disease   . Diabetes mellitus ([place])   . Dialysis patient ([place])   . GERD (gastroesophageal reflux disease)    occ  . Heart murmur   . Hypercholesteremia   . Hypertension   . Psoriasis    Past Surgical History:  Procedure Laterality Date  . CARDIAC CATHETERIZATION  09   "some blockage" with collaterals by cath at [facility] ~ 2009; no intervention required; reportedly, no routine cardiology f/u recommended    . CORONARY ATHERECTOMY N/A 08/09/2018   Procedure: CORONARY ATHERECTOMY;  Surgeon: Jettie Booze, MD;  Location: [facility] LAB;  Service: Cardiovascular;  Laterality: N/A;  . CORONARY STENT INTERVENTION N/A 08/09/2018   Procedure: CORONARY STENT INTERVENTION;  Surgeon: Jettie Booze, MD;  Location: [facility];  Service: Cardiovascular;  Laterality: N/A;  . INGUINAL HERNIA REPAIR Right 04/13/2012   Procedure: HERNIA REPAIR INGUINAL INCARCERATED;  Surgeon: Gayland Curry, MD;  Location: [facility];  Service: General;  Laterality: Right;  . INSERTION OF MESH Right 04/13/2012   Procedure: INSERTION OF MESH;  Surgeon: Gayland Curry, MD;  Location: [facility];  Service: General;  Laterality: Right;  . LEFT HEART CATH N/A 08/09/2018   Procedure: Left Heart Cath;  Surgeon: Jettie Booze, MD;  Location: [facility] LAB;  Service: Cardiovascular;  Laterality: N/A;  . LEFT HEART CATH AND CORONARY ANGIOGRAPHY N/A 08/08/2018   Procedure: LEFT HEART CATH AND CORONARY ANGIOGRAPHY;  Surgeon: Troy Sine, MD;  Location: [facility];  Service: Cardiovascular;  Laterality: N/A;  . VASECTOMY       A IV Location/Drains/Wounds Patient Lines/Drains/Airways Status   Active Line/Drains/Airways    Name:   Placement date:   Placement time:   Site:   Days:   Peripheral IV 09/27/18 Posterior;Right Forearm   09/27/18    1707    Forearm   less than 1   Fistula / Graft Left Upper arm   -    -  Upper arm      Incision 04/13/12 Groin Right   04/13/12    0815     2358          Intake/Output Last 24 hours No intake or output data in the 24 hours ending 09/27/18 2131  Labs/Imaging Results for orders placed or performed during the hospital encounter of 09/27/18 (from the past 48 hour(s))  Type and screen [place]     Status: None (Preliminary result)   Collection Time: 09/27/18  6:50 PM  Result Value Ref Range   ABO/RH(D) A NEG    Antibody Screen NEG    Sample  Expiration 09/30/2018,2359    Unit Number [id number]    Blood Component Type RED CELLS,LR    Unit division 00    Status of Unit ALLOCATED    Transfusion Status OK TO TRANSFUSE    Crossmatch Result Compatible    Unit Number [id number]    Blood Component Type RED CELLS,LR    Unit division 00    Status of Unit ISSUED    Transfusion Status OK TO TRANSFUSE    Crossmatch Result      Compatible Performed at [facility], [address] [place], [place] [address]    Unit Number [id number]    Blood Component Type RBC LR PHER1    Unit division 00    Status of Unit ALLOCATED    Transfusion Status OK TO TRANSFUSE    Crossmatch Result Compatible   Comprehensive metabolic panel     Status: Abnormal   Collection Time: 09/27/18  7:07 PM  Result Value Ref Range   Sodium 137 135 - 145 mmol/L   Potassium 4.3 3.5 - 5.1 mmol/L   Chloride 97 (L) 98 - 111 mmol/L   CO2 25 22 - 32 mmol/L   Glucose, Bld 105 (H) 70 - 99 mg/dL   BUN 28 (H) 8 - 23 mg/dL   Creatinine, Ser 4.42 (H) 0.61 - 1.24 mg/dL   Calcium 8.0 (L) 8.9 - 10.3 mg/dL   Total Protein 6.1 (L) 6.5 - 8.1 g/dL   Albumin 2.9 (L) 3.5 - 5.0 g/dL   AST 21 15 - 41 U/L   ALT 18 0 - 44 U/L   Alkaline Phosphatase 70 38 - 126 U/L   Total Bilirubin 0.2 (L) 0.3 - 1.2 mg/dL   GFR calc non Af Amer 11 (L) >60 mL/min   GFR calc Af Amer 13 (L) >60 mL/min   Anion gap 15 5 - 15    Comment: Performed at [facility], [place] [address] [place], [place] [address]  CBC WITH DIFFERENTIAL     Status: Abnormal   Collection Time: 09/27/18  7:07 PM  Result Value Ref Range   WBC 8.9 4.0 - 10.5 K/uL   RBC 1.63 (L) 4.22 - 5.81 MIL/uL   Hemoglobin 5.1 (LL) 13.0 - 17.0 g/dL    Comment: This critical result has verified and been called to S TOWNS,RN by Red Christians on 09 03 2020 at 1924, and has been read back.  REPEATED TO VERIFY CORRECTED ON 09/03 AT 1959: PREVIOUSLY REPORTED AS 5.1 This critical result has verified and been called to S TOWNS,RN  by Red Christians on 09 03 2020 at 1924, and has been read back.     HCT 15.8 (L) 39.0 - 52.0 %   MCV 96.9 80.0 - 100.0 fL   MCH 31.3 26.0 - 34.0 pg   MCHC 32.3 30.0 -  36.0 g/dL   RDW 15.3 11.5 - 15.5 %   Platelets 202 150 - 400 K/uL   nRBC 0.0 0.0 - 0.2 %   Neutrophils Relative % 66 %   Neutro Abs 5.9 1.7 - 7.7 K/uL   Lymphocytes Relative 22 %   Lymphs Abs 1.9 0.7 - 4.0 K/uL   Monocytes Relative 9 %   Monocytes Absolute 0.8 0.1 - 1.0 K/uL   Eosinophils Relative 3 %   Eosinophils Absolute 0.3 0.0 - 0.5 K/uL   Basophils Relative 0 %   Basophils Absolute 0.0 0.0 - 0.1 K/uL   Immature Granulocytes 0 %   Abs Immature Granulocytes 0.04 0.00 - 0.07 K/uL    Comment: Performed at [facility] [address] [place], [place] [address]  Protime-INR     Status: None   Collection Time: 09/27/18  7:07 PM  Result Value Ref Range   Prothrombin Time 13.2 11.4 - 15.2 seconds   INR 1.0 0.8 - 1.2    Comment: (NOTE) INR goal varies based on device and disease states. Performed at [facility], [place] [address], [place], [place] [address]   POC occult blood, ED     Status: Abnormal   Collection Time: 09/27/18  7:41 PM  Result Value Ref Range   Fecal Occult Bld POSITIVE (A) NEGATIVE  Prepare RBC     Status: None   Collection Time: 09/27/18  8:01 PM  Result Value Ref Range   Order Confirmation      ORDER PROCESSED BY BLOOD BANK Performed at [facility], [place] [address] [place],  [address]    No results found.  Pending Labs Unresulted Labs (From admission, onward)    Start     Ordered   09/27/18 2116  CBC  Now then every 6 hours,   R (with STAT occurrences)     09/27/18 2115   09/27/18 2114  Vitamin B12  (Anemia Panel (PNL))  Once,   STAT     09/27/18 2113   09/27/18 2114  Folate  (Anemia Panel (PNL))  Once,   STAT     09/27/18 2113   09/27/18 2114  Iron and TIBC  (Anemia Panel (PNL))  Once,   STAT     09/27/18 2113   09/27/18 2114  Ferritin  (Anemia Panel  (PNL))  Once,   STAT     09/27/18 2113   09/27/18 2114  Reticulocytes  (Anemia Panel (PNL))  Once,   STAT     09/27/18 2113   09/27/18 2108  SARS Coronavirus 2 [facility] order, Performed in [facility] hospital lab) Nasopharyngeal Nasopharyngeal Swab  (Symptomatic/High Risk of Exposure/Tier 1 Patients Labs with Precautions)  Once,   STAT    Question Answer Comment  Is this test for diagnosis or screening Screening   Symptomatic for COVID-19 as defined by CDC No   Hospitalized for COVID-19 No   Admitted to ICU for COVID-19 No   Previously tested for COVID-19 Yes   Resident in a congregate (group) care setting No   Employed in healthcare setting No      09/27/18 2107   Signed and Held  Basic metabolic panel  Tomorrow morning,   R     Signed and Held   Signed and Held  APTT  Once,   R     Signed and Held          Vitals/Pain Today's Vitals   09/27/18 2030 09/27/18 2042  09/27/18 2045 09/27/18 2058  BP: (!) 95/48 (!) 94/52 (!) 97/48 (!) (P) 113/56  Pulse: 97 88 87   Resp: 20 16 (!) 22   Temp:  98 F (36.7 C)  (P) 97.9 F (36.6 C)  TempSrc:  Oral  (P) Oral  SpO2: 98% 96% 99%   Weight:      Height:      PainSc:        Isolation Precautions Airborne and Contact precautions  Medications Medications  pantoprazole (PROTONIX) injection 40 mg (has no administration in time range)  0.9 %  sodium chloride infusion (10 mL/hr Intravenous New Bag/Given 09/27/18 2030)    Mobility walks with person assist Low fall risk   Focused Assessments GI      R Recommendations: See Admitting Provider Note  Report given to:   Additional Notes:

## 2018-09-27 NOTE — Telephone Encounter (Signed)
° °  Spouse calling, states the [facility] is sending patient to [facility] for transfusion for low hemoglobin

## 2018-09-27 NOTE — H&P (Signed)
History and Physical    Nicholas [name] WNU:[id number] DOB: 1930-06-06 DOA: 09/27/2018  Referring MD/NP/PA:   PCP: Carol Ada, MD   Patient coming from:  The patient is coming from home.  At baseline, pt is independent for most of ADL.        Chief Complaint: GIB   HPI: Nicholas [name] is a 85 y.o. male with medical history significant of hypertension, hyperlipidemia, GERD, ESRD-HD (TTS), anemia, CAD, STEMI, sCHF, who presents with GI bleeding.  Pt states that he had episode of rectal bleeding 1 week ago, and another episode this morning with dark red blood.  Patient denies any nausea, vomiting, diarrhea or abdominal pain.  Patient had dialysis this morning, and was found to have low hemoglobin of 5 at dialysis center. Patient does not have chest pain, shortness of breath.  No dizziness or lightheadedness. He is taking ASA and plavix currently.  Patient does not have symptoms of UTI or unilateral weakness. Patient states that he possibly had EGD in the past, but not very sure.  He had colonoscopy at [facility] last year, but not sure about the results  ED Course: pt was found to have positive FOBT, negative Covid 19, hemoglobin dropped from 7.8 on 08/10/2018 5.1, INR 1.0, potassium 4.3, bicarbonate 25, creatinine 4.42, BUN 28, temperature normal, blood pressure 94/52, heart rate 87, oxygen saturation 99% on room air.  Patient is placed on telemetry bed for observation.  Review of Systems:   General: no fevers, chills, no body weight gain, has fatigue HEENT: no blurry vision, hearing changes or sore throat Respiratory: no dyspnea, coughing, wheezing CV: no chest pain, no palpitations GI: no nausea, vomiting, abdominal pain, diarrhea, constipation. Rectal bleeding. GU: no dysuria, burning on urination, increased urinary frequency, hematuria. Ext: no leg edema Neuro: no unilateral weakness, numbness, or tingling, no vision change or hearing loss Skin: no rash, no skin tear.  MSK: No muscle spasm, no deformity, no limitation of range of movement in spin Heme: No easy bruising.  Travel history: No recent long distant travel.  Allergy:  Allergies  Allergen Reactions  . Hydrochlorothiazide Other (See Comments)    [facility]    Past Medical History:  Diagnosis Date  . Arthritis   . Chronic kidney disease   . Diabetes mellitus ([place])   . Dialysis patient ([place])   . GERD (gastroesophageal reflux disease)    occ  . Heart murmur   . Hypercholesteremia   . Hypertension   . Psoriasis     Past Surgical History:  Procedure Laterality Date  . CARDIAC CATHETERIZATION  09   "some blockage" with collaterals by cath at [facility] ~ 2009; no intervention required; reportedly, no routine cardiology f/u recommended   . CORONARY ATHERECTOMY N/A 08/09/2018   Procedure: CORONARY ATHERECTOMY;  Surgeon: Jettie Booze, MD;  Location: [place] [facility];  Service: Cardiovascular;  Laterality: N/A;  . CORONARY STENT INTERVENTION N/A 08/09/2018   Procedure: CORONARY STENT INTERVENTION;  Surgeon: Jettie Booze, MD;  Location: [place] [facility];  Service: Cardiovascular;  Laterality: N/A;  . INGUINAL HERNIA REPAIR Right 04/13/2012   Procedure: HERNIA REPAIR INGUINAL INCARCERATED;  Surgeon: Gayland Curry, MD;  Location: [place];  Service: General;  Laterality: Right;  . INSERTION OF MESH Right 04/13/2012   Procedure: INSERTION OF MESH;  Surgeon: Gayland Curry, MD;  Location: [place];  Service: General;  Laterality: Right;  . LEFT HEART CATH N/A 08/09/2018   Procedure:  Left Heart Cath;  Surgeon: Jettie Booze, MD;  Location: [place] [facility];  Service: Cardiovascular;  Laterality: N/A;  . LEFT HEART CATH AND CORONARY ANGIOGRAPHY N/A 08/08/2018   Procedure: LEFT HEART CATH AND CORONARY ANGIOGRAPHY;  Surgeon: Troy Sine, MD;  Location: [place] [facility];  Service: Cardiovascular;  Laterality: N/A;  . VASECTOMY      Social History:  reports that he quit  smoking about 31 years ago. His smoking use included cigarettes. He has a 15.00 pack-year smoking history. He has never used smokeless tobacco. He reports that he does not drink alcohol or use drugs.  Family History:  Family History  Problem Relation Age of Onset  . Diabetes Mother   . Stroke Father   . Diabetes Sister   . Diabetes Brother      Prior to Admission medications   Medication Sig Start Date End Date Taking? Authorizing Provider  aspirin 81 MG chewable tablet Chew 1 tablet (81 mg total) by mouth daily. 08/11/18  Yes [name], MD  atorvastatin (LIPITOR) 80 MG tablet Take 1 tablet (80 mg total) by mouth daily at 6 PM. 08/10/18  Yes [name], MD  cinacalcet (SENSIPAR) 30 MG tablet Take 30 mg by mouth daily.   Yes [provider]  clopidogrel (PLAVIX) 75 MG tablet Take 1 tablet (75 mg total) by mouth daily with breakfast. 08/11/18  Yes [name], MD  insulin NPH-insulin regular (NOVOLIN 70/30) (70-30) 100 UNIT/ML injection Inject 20 Units into the skin 2 (two) times daily as needed (CBG >100).    Yes [provider]  metoprolol succinate (TOPROL-XL) 25 MG 24 hr tablet Take 1 tablet (25 mg total) by mouth See admin instructions. Take 1/2 tablet (25 mg) on non dialysis days -  Sunday, Monday, Wednesday, Friday. Take with or immediately following a meal. 08/10/18  Yes [name], MD  nitroGLYCERIN (NITROSTAT) 0.4 MG SL tablet Place 1 tablet (0.4 mg total) under the tongue every 5 (five) minutes x 3 doses as needed for chest pain. 08/10/18  Yes [name], MD  PROTEIN PO Take 237 mLs by mouth daily.   Yes [provider]  sevelamer carbonate (RENVELA) 800 MG tablet Take 1,600 mg by mouth 3 (three) times daily with meals.  05/09/18  Yes [provider]    Physical Exam: Vitals:   09/27/18 2330 09/28/18 0122 09/28/18 0123 09/28/18 0207  BP: (!) 109/53 119/63 119/63 (!) 105/58  Pulse: 81 80 83 81  Resp:  19 16 16 17   Temp:  99.3 F (37.4 C) 99.3 F (37.4 C) 99.1 F (37.3 C)  TempSrc:  Oral Oral Oral  SpO2: 97% 99% 100% 100%  Weight:      Height:       General: Not in acute distress. Pale looking.  HEENT:       Eyes: PERRL, EOMI, no scleral icterus.       ENT: No discharge from the ears and nose, no pharynx injection, no tonsillar enlargement.        Neck: No JVD, no bruit, no mass felt. Heme: No neck lymph node enlargement. Cardiac: S1/S2, RRR, No murmurs, No gallops or rubs. Respiratory: No rales, wheezing, rhonchi or rubs. GI: Soft, nondistended, nontender, no rebound pain, no organomegaly, BS present. GU: No hematuria Ext: No pitting leg edema bilaterally. 2+DP/PT pulse bilaterally. Musculoskeletal: No joint deformities, No joint redness or warmth, no limitation of ROM in spin. Skin: No rashes.  Neuro: Alert, oriented X3,  cranial nerves II-XII grossly intact, moves all extremities normally.  Psych: Patient is not psychotic, no suicidal or hemocidal ideation.  Labs on Admission: I have personally reviewed following labs and imaging studies  CBC: Recent Labs  Lab 09/27/18 1907  WBC 8.9  NEUTROABS 5.9  HGB 5.1*  HCT 15.8*  MCV 96.9  PLT 528   Basic Metabolic Panel: Recent Labs  Lab 09/27/18 1907  NA 137  K 4.3  CL 97*  CO2 25  GLUCOSE 105*  BUN 28*  CREATININE 4.42*  CALCIUM 8.0*   GFR: Estimated Creatinine Clearance: 11.8 mL/min (A) (by C-G formula based on SCr of 4.42 mg/dL (H)). Liver Function Tests: Recent Labs  Lab 09/27/18 1907  AST 21  ALT 18  ALKPHOS 70  BILITOT 0.2*  PROT 6.1*  ALBUMIN 2.9*   No results for input(s): LIPASE, AMYLASE in the last 168 hours. No results for input(s): AMMONIA in the last 168 hours. Coagulation Profile: Recent Labs  Lab 09/27/18 1907  INR 1.0   Cardiac Enzymes: No results for input(s): CKTOTAL, CKMB, CKMBINDEX, TROPONINI in the last 168 hours. BNP (last 3 results) No results for input(s): PROBNP in the  last 8760 hours. HbA1C: No results for input(s): HGBA1C in the last 72 hours. CBG: Recent Labs  Lab 09/28/18 0123  GLUCAP 147*   Lipid Profile: No results for input(s): CHOL, HDL, LDLCALC, TRIG, CHOLHDL, LDLDIRECT in the last 72 hours. Thyroid Function Tests: No results for input(s): TSH, T4TOTAL, FREET4, T3FREE, THYROIDAB in the last 72 hours. Anemia Panel: No results for input(s): VITAMINB12, FOLATE, FERRITIN, TIBC, IRON, RETICCTPCT in the last 72 hours. Urine analysis: No results found for: COLORURINE, APPEARANCEUR, LABSPEC, PHURINE, GLUCOSEU, HGBUR, BILIRUBINUR, KETONESUR, PROTEINUR, UROBILINOGEN, NITRITE, LEUKOCYTESUR Sepsis Labs: @LABRCNTIP (procalcitonin:4,lacticidven:4) ) Recent Results (from the past 240 hour(s))  SARS Coronavirus 2 [facility] order, Performed in [facility] hospital lab) Nasopharyngeal Nasopharyngeal Swab     Status: None   Collection Time: 09/27/18  9:08 PM   Specimen: Nasopharyngeal Swab  Result Value Ref Range Status   SARS Coronavirus 2 NEGATIVE NEGATIVE Final    Comment: (NOTE) If result is NEGATIVE SARS-CoV-2 target nucleic acids are NOT DETECTED. The SARS-CoV-2 RNA is generally detectable in upper and lower  respiratory specimens during the acute phase of infection. The lowest  concentration of SARS-CoV-2 viral copies this assay can detect is 250  copies / mL. A negative result does not preclude SARS-CoV-2 infection  and should not be used as the sole basis for treatment or other  patient management decisions.  A negative result may occur with  improper specimen collection / handling, submission of specimen other  than nasopharyngeal swab, presence of viral mutation(s) within the  areas targeted by this assay, and inadequate number of viral copies  (<250 copies / mL). A negative result must be combined with clinical  observations, patient history, and epidemiological information. If result is POSITIVE SARS-CoV-2 target nucleic acids are  DETECTED. The SARS-CoV-2 RNA is generally detectable in upper and lower  respiratory specimens dur ing the acute phase of infection.  Positive  results are indicative of active infection with SARS-CoV-2.  Clinical  correlation with patient history and other diagnostic information is  necessary to determine patient infection status.  Positive results do  not rule out bacterial infection or co-infection with other viruses. If result is PRESUMPTIVE POSTIVE SARS-CoV-2 nucleic acids MAY BE PRESENT.   A presumptive positive result was obtained on the submitted specimen  and confirmed on repeat testing.  While 2019 novel coronavirus  (SARS-CoV-2) nucleic acids may be present in the submitted sample  additional confirmatory testing may be necessary for epidemiological  and / or clinical management purposes  to differentiate between  SARS-CoV-2 and other Sarbecovirus currently known to infect humans.  If clinically indicated additional testing with an alternate test  methodology [phone]) is advised. The SARS-CoV-2 RNA is generally  detectable in upper and lower respiratory sp ecimens during the acute  phase of infection. The expected result is Negative. Fact Sheet for Patients:  StrictlyIdeas.no Fact Sheet for Healthcare Providers: BankingDealers.co.za This test is not yet approved or cleared by the [place] FDA and has been authorized for detection and/or diagnosis of SARS-CoV-2 by FDA under an Emergency Use Authorization (EUA).  This EUA will remain in effect (meaning this test can be used) for the duration of the COVID-19 declaration under Section 564(b)(1) of the Act, 21 U.S.C. section 360bbb-3(b)(1), unless the authorization is terminated or revoked sooner. Performed at [facility], [place] [address], [place], [place] [address]      Radiological Exams on Admission: No results found.   EKG: Independently reviewed.  Sinus  rhythm, QTC 463, low voltage, poor R wave progression.   Assessment/Plan Principal Problem:   GIB (gastrointestinal bleeding) Active Problems:   Hypertension   Hypercholesteremia   ESRD (end stage renal disease) on dialysis (HCC)   Anemia of chronic disease   Anemia due to blood loss   Type II diabetes mellitus with renal manifestations (HCC)   GERD (gastroesophageal reflux disease)   CAD (coronary artery disease)   Chronic systolic CHF (congestive heart failure) (HCC)   GIB (gastrointestinal bleeding): Hgb dropped from 7.8 to 5.1.  Blood pressure softer.  Hemodynamically stable currently.  - will place in tele bed for obs -  Hold ASA and plavix - transfuse 3 units of blood now - NPO - IVF: none due to ESRD - Start IV pantoprazole 40 mg bid - Zofran IV for nausea - Avoid NSAIDs and SQ heparin - Maintain IV access (2 large bore IVs if possible). - Monitor closely and follow q6h cbc, transfuse as necessary, if Hgb<7.0 - LaB: INR, PTT and type screen - please call GI in AM  Hypertension: Bp 94/52. -hold metoprolol  Hypercholesteremia: -lipitor  ESRD (end stage renal disease) on dialysis [facility]): Patient had dialysis on Thursday.  Potassium 4.3, bicarbonate 25, creatinine 4.43, BUN 28. -please consult renal in AM  Anemia of chronic disease and Anemia due to blood loss: -see above -check anemia panel  Type II diabetes mellitus with renal manifestations ([place]): Last A1c 6.8 on 08/07/18, well controled. Patient is taking as needed 70/30 insulin at home.  Blood sugar 105 -SSI  GERD (gastroesophageal reflux disease): -on protonix   CAD (coronary artery disease) and hx of STEMI: no chest pain -lipitor -hold ASA and plavix  Chronic systolic CHF (congestive heart failure) ([place]): 2D echo on 08/07/2018 showed EF of 35-40%.  No leg edema or JVD.  Denies shortness of breath.  CHF is compensated. -Volume management per renal     DVT ppx: SCD Code Status: Full code Family  Communication: None at bed side.         Disposition Plan:  Anticipate discharge back to previous home environment Consults called:  none Admission status: Obs / tele    Date of Service 09/28/2018    [facility]   If 7PM-7AM, please contact night-coverage [email or web address] Password [email or web address] 09/28/2018, 4:17 AM

## 2018-09-28 ENCOUNTER — Encounter (HOSPITAL_COMMUNITY): Payer: Self-pay | Admitting: Nephrology

## 2018-09-28 DIAGNOSIS — E1122 Type 2 diabetes mellitus with diabetic chronic kidney disease: Secondary | ICD-10-CM | POA: Diagnosis not present

## 2018-09-28 DIAGNOSIS — E785 Hyperlipidemia, unspecified: Secondary | ICD-10-CM | POA: Diagnosis present

## 2018-09-28 DIAGNOSIS — I2510 Atherosclerotic heart disease of native coronary artery without angina pectoris: Secondary | ICD-10-CM | POA: Diagnosis present

## 2018-09-28 DIAGNOSIS — I5022 Chronic systolic (congestive) heart failure: Secondary | ICD-10-CM

## 2018-09-28 DIAGNOSIS — Z833 Family history of diabetes mellitus: Secondary | ICD-10-CM | POA: Diagnosis not present

## 2018-09-28 DIAGNOSIS — Z955 Presence of coronary angioplasty implant and graft: Secondary | ICD-10-CM | POA: Diagnosis not present

## 2018-09-28 DIAGNOSIS — K921 Melena: Secondary | ICD-10-CM | POA: Diagnosis not present

## 2018-09-28 DIAGNOSIS — E8889 Other specified metabolic disorders: Secondary | ICD-10-CM | POA: Diagnosis present

## 2018-09-28 DIAGNOSIS — I5042 Chronic combined systolic (congestive) and diastolic (congestive) heart failure: Secondary | ICD-10-CM | POA: Diagnosis present

## 2018-09-28 DIAGNOSIS — D638 Anemia in other chronic diseases classified elsewhere: Secondary | ICD-10-CM | POA: Diagnosis not present

## 2018-09-28 DIAGNOSIS — Z7902 Long term (current) use of antithrombotics/antiplatelets: Secondary | ICD-10-CM | POA: Diagnosis not present

## 2018-09-28 DIAGNOSIS — Z992 Dependence on renal dialysis: Secondary | ICD-10-CM

## 2018-09-28 DIAGNOSIS — Z87891 Personal history of nicotine dependence: Secondary | ICD-10-CM | POA: Diagnosis not present

## 2018-09-28 DIAGNOSIS — K219 Gastro-esophageal reflux disease without esophagitis: Secondary | ICD-10-CM

## 2018-09-28 DIAGNOSIS — D631 Anemia in chronic kidney disease: Secondary | ICD-10-CM | POA: Diagnosis not present

## 2018-09-28 DIAGNOSIS — I255 Ischemic cardiomyopathy: Secondary | ICD-10-CM | POA: Diagnosis present

## 2018-09-28 DIAGNOSIS — E875 Hyperkalemia: Secondary | ICD-10-CM | POA: Diagnosis present

## 2018-09-28 DIAGNOSIS — D500 Iron deficiency anemia secondary to blood loss (chronic): Secondary | ICD-10-CM

## 2018-09-28 DIAGNOSIS — N186 End stage renal disease: Secondary | ICD-10-CM | POA: Diagnosis not present

## 2018-09-28 DIAGNOSIS — Z79899 Other long term (current) drug therapy: Secondary | ICD-10-CM | POA: Diagnosis not present

## 2018-09-28 DIAGNOSIS — K922 Gastrointestinal hemorrhage, unspecified: Secondary | ICD-10-CM | POA: Diagnosis not present

## 2018-09-28 DIAGNOSIS — N2581 Secondary hyperparathyroidism of renal origin: Secondary | ICD-10-CM | POA: Diagnosis not present

## 2018-09-28 DIAGNOSIS — I132 Hypertensive heart and chronic kidney disease with heart failure and with stage 5 chronic kidney disease, or end stage renal disease: Secondary | ICD-10-CM | POA: Diagnosis present

## 2018-09-28 DIAGNOSIS — E7800 Pure hypercholesterolemia, unspecified: Secondary | ICD-10-CM | POA: Diagnosis present

## 2018-09-28 DIAGNOSIS — I252 Old myocardial infarction: Secondary | ICD-10-CM | POA: Diagnosis not present

## 2018-09-28 DIAGNOSIS — D62 Acute posthemorrhagic anemia: Secondary | ICD-10-CM | POA: Diagnosis not present

## 2018-09-28 DIAGNOSIS — I120 Hypertensive chronic kidney disease with stage 5 chronic kidney disease or end stage renal disease: Secondary | ICD-10-CM | POA: Diagnosis not present

## 2018-09-28 DIAGNOSIS — K920 Hematemesis: Secondary | ICD-10-CM | POA: Diagnosis not present

## 2018-09-28 DIAGNOSIS — D649 Anemia, unspecified: Secondary | ICD-10-CM | POA: Diagnosis present

## 2018-09-28 DIAGNOSIS — Z794 Long term (current) use of insulin: Secondary | ICD-10-CM | POA: Diagnosis not present

## 2018-09-28 DIAGNOSIS — K5791 Diverticulosis of intestine, part unspecified, without perforation or abscess with bleeding: Secondary | ICD-10-CM | POA: Diagnosis present

## 2018-09-28 DIAGNOSIS — Z20828 Contact with and (suspected) exposure to other viral communicable diseases: Secondary | ICD-10-CM | POA: Diagnosis present

## 2018-09-28 DIAGNOSIS — Z7982 Long term (current) use of aspirin: Secondary | ICD-10-CM | POA: Diagnosis not present

## 2018-09-28 LAB — BASIC METABOLIC PANEL
Anion gap: 10 (ref 5–15)
BUN: 36 mg/dL — ABNORMAL HIGH (ref 8–23)
CO2: 28 mmol/L (ref 22–32)
Calcium: 8 mg/dL — ABNORMAL LOW (ref 8.9–10.3)
Chloride: 100 mmol/L (ref 98–111)
Creatinine, Ser: 5.32 mg/dL — ABNORMAL HIGH (ref 0.61–1.24)
GFR calc Af Amer: 10 mL/min — ABNORMAL LOW (ref >60)
GFR calc non Af Amer: 9 mL/min — ABNORMAL LOW (ref >60)
Glucose, Bld: 93 mg/dL (ref 70–99)
Potassium: 5.1 mmol/L (ref 3.5–5.1)
Sodium: 138 mmol/L (ref 135–145)

## 2018-09-28 LAB — GLUCOSE, CAPILLARY
Glucose-Capillary: 147 mg/dL — ABNORMAL HIGH (ref 70–99)
Glucose-Capillary: 94 mg/dL (ref 70–99)
Glucose-Capillary: 99 mg/dL (ref 70–99)
Glucose-Capillary: 169 mg/dL — ABNORMAL HIGH (ref 70–99)
Glucose-Capillary: 88 mg/dL (ref 70–99)

## 2018-09-28 LAB — IRON AND TIBC
Iron: 120 ug/dL (ref 45–182)
Saturation Ratios: 51 % — ABNORMAL HIGH (ref 17.9–39.5)
TIBC: 234 ug/dL — ABNORMAL LOW (ref 250–450)
UIBC: 114 ug/dL

## 2018-09-28 LAB — CBC
HCT: 23.2 % — ABNORMAL LOW (ref 39.0–52.0)
HCT: 23.2 % — ABNORMAL LOW (ref 39.0–52.0)
Hemoglobin: 7.9 g/dL — ABNORMAL LOW (ref 13.0–17.0)
Hemoglobin: 8 g/dL — ABNORMAL LOW (ref 13.0–17.0)
MCH: 31.3 pg (ref 26.0–34.0)
MCH: 31.6 pg (ref 26.0–34.0)
MCHC: 34.1 g/dL (ref 30.0–36.0)
MCHC: 34.5 g/dL (ref 30.0–36.0)
MCV: 92.1 fL (ref 80.0–100.0)
MCV: 91.7 fL (ref 80.0–100.0)
Platelets: 151 10*3/uL (ref 150–400)
Platelets: 159 10*3/uL (ref 150–400)
RBC: 2.52 MIL/uL — ABNORMAL LOW (ref 4.22–5.81)
RBC: 2.53 MIL/uL — ABNORMAL LOW (ref 4.22–5.81)
RDW: 15.2 % (ref 11.5–15.5)
RDW: 15.3 % (ref 11.5–15.5)
WBC: 7.7 10*3/uL (ref 4.0–10.5)
WBC: 7.7 10*3/uL (ref 4.0–10.5)
nRBC: 0 % (ref 0.0–0.2)
nRBC: 0 % (ref 0.0–0.2)

## 2018-09-28 LAB — RETICULOCYTES
Immature Retic Fract: 21.8 % — ABNORMAL HIGH (ref 2.3–15.9)
RBC.: 2.52 MIL/uL — ABNORMAL LOW (ref 4.22–5.81)
Retic Count, Absolute: 52.4 10*3/uL (ref 19.0–186.0)
Retic Ct Pct: 2.1 % (ref 0.4–3.1)

## 2018-09-28 LAB — FOLATE: Folate: 10.7 ng/mL (ref >5.9)

## 2018-09-28 LAB — VITAMIN B12: Vitamin B-12: 462 pg/mL (ref 180–914)

## 2018-09-28 LAB — FERRITIN: Ferritin: 887 ng/mL — ABNORMAL HIGH (ref 24–336)

## 2018-09-28 LAB — MRSA PCR SCREENING: MRSA by PCR: NEGATIVE

## 2018-09-28 LAB — APTT: aPTT: 29 seconds (ref 24–36)

## 2018-09-28 MED ORDER — ONDANSETRON HCL 4 MG PO TABS
4.0000 mg | ORAL_TABLET | Freq: Four times a day (QID) | ORAL | Status: DC | PRN
  Filled 2018-09-28: qty 1

## 2018-09-28 MED ORDER — CINACALCET HCL 30 MG PO TABS
30.0000 mg | ORAL_TABLET | Freq: Every day | ORAL | Status: DC
  Administered 2018-09-29 – 2018-10-04 (×6): 30 mg via ORAL
  Filled 2018-09-28 (×6): qty 1

## 2018-09-28 MED ORDER — ATORVASTATIN CALCIUM 80 MG PO TABS
80.0000 mg | ORAL_TABLET | Freq: Every day | ORAL | Status: DC
  Administered 2018-09-28 – 2018-10-03 (×6): 80 mg via ORAL
  Filled 2018-09-28 (×7): qty 1

## 2018-09-28 MED ORDER — ONDANSETRON HCL 4 MG/2ML IJ SOLN: 4.0000 mg | Freq: Four times a day (QID) | INTRAMUSCULAR | Status: DC | PRN

## 2018-09-28 MED ORDER — ACETAMINOPHEN 325 MG PO TABS
650.0000 mg | ORAL_TABLET | Freq: Four times a day (QID) | ORAL | Status: DC | PRN
  Administered 2018-09-29: 650 mg via ORAL
  Filled 2018-09-28: qty 2

## 2018-09-28 MED ORDER — POLYETHYLENE GLYCOL 3350 17 G PO PACK: 17.0000 g | PACK | Freq: Three times a day (TID) | ORAL | Status: DC

## 2018-09-28 MED ORDER — CLOPIDOGREL BISULFATE 75 MG PO TABS
75.0000 mg | ORAL_TABLET | Freq: Every day | ORAL | Status: DC
  Administered 2018-09-28 – 2018-10-04 (×7): 75 mg via ORAL
  Filled 2018-09-28 (×7): qty 1

## 2018-09-28 MED ORDER — ACETAMINOPHEN 650 MG RE SUPP: 650.0000 mg | Freq: Four times a day (QID) | RECTAL | Status: DC | PRN

## 2018-09-28 MED ORDER — NITROGLYCERIN 0.4 MG SL SUBL: 0.4000 mg | SUBLINGUAL_TABLET | SUBLINGUAL | Status: DC | PRN

## 2018-09-28 MED ORDER — INSULIN ASPART 100 UNIT/ML ~~LOC~~ SOLN: 0.0000 [IU] | Freq: Every day | SUBCUTANEOUS | Status: DC

## 2018-09-28 MED ORDER — INSULIN ASPART 100 UNIT/ML ~~LOC~~ SOLN
0.0000 [IU] | Freq: Three times a day (TID) | SUBCUTANEOUS | Status: DC
  Administered 2018-09-28: 2 [IU] via SUBCUTANEOUS
  Administered 2018-09-29 – 2018-09-30 (×2): 1 [IU] via SUBCUTANEOUS
  Administered 2018-10-01 – 2018-10-03 (×2): 2 [IU] via SUBCUTANEOUS

## 2018-09-28 MED ORDER — CHLORHEXIDINE GLUCONATE CLOTH 2 % EX PADS
6.0000 | MEDICATED_PAD | Freq: Every day | CUTANEOUS | Status: DC
  Administered 2018-10-04: 6 via TOPICAL

## 2018-09-28 MED ORDER — METOPROLOL SUCCINATE ER 25 MG PO TB24
25.0000 mg | ORAL_TABLET | Freq: Every day | ORAL | Status: DC
  Administered 2018-09-28 – 2018-10-04 (×6): 25 mg via ORAL
  Filled 2018-09-28 (×7): qty 1

## 2018-09-28 MED ORDER — SEVELAMER CARBONATE 800 MG PO TABS
1600.0000 mg | ORAL_TABLET | Freq: Three times a day (TID) | ORAL | Status: DC
  Administered 2018-09-28 – 2018-10-04 (×13): 1600 mg via ORAL
  Filled 2018-09-28 (×14): qty 2

## 2018-09-28 MED ORDER — POLYETHYLENE GLYCOL 3350 17 G PO PACK
17.0000 g | PACK | Freq: Two times a day (BID) | ORAL | Status: DC
  Administered 2018-09-28 – 2018-10-04 (×7): 17 g via ORAL
  Filled 2018-09-28 (×13): qty 1

## 2018-09-28 NOTE — Progress Notes (Signed)
PROGRESS NOTE    Nicholas [name]  PFX:[id number] DOB: February 25, 1930 DOA: 09/27/2018 PCP: Carol Ada, MD  Brief Narrative: 85 year old male with history of ESRD on hemodialysis Tuesday Thursday Saturday, CAD, chronic systolic CHF, hypertension, dyslipidemia. -He was admitted in July with the anterior non-STEMI, cath showed high-grade LAD, three-vessel disease with total occlusion of RCA, patient decided against bypass, subsequently underwent PCI with DES to proximal LAD, subsequently started on aspirin Plavix statin and metoprolol, echo showed EF of 35 to 40%, getting beyond 3 months of dual antiplatelet therapy was felt to be key if possible per Cardiologist Dr.Smith  Assessment & Plan:   Symptomatic anemia, suspected lower GI bleed -Long history of chronic anemia with iron deficiency and chronic disease/CKD -Now admitted with hemoglobin of 5.1, reported hematochezia, in the setting of dual antiplatelet therapy, -Last colonoscopy at [facility] in 2019 noted cecal AVM status post ablation [facility] gastroenterology consulted -Upper GI bleed appears less likely, patient is a very poor historian, BUN is not considerably elevated from baseline -Aspirin and Plavix were held on admission last night, I called and discussed with cardiology, Dr. Martinique this morning, given recent LAD stent less than 2 months ago will restart Plavix, hold aspirin for now and monitor clinical course -No further bleeding since last episode last night -Hemoglobin improved to 7.9 status post 3 units of PRBC, monitor hemoglobin closely -Continue PPI  CAD/multivessel disease -admitted in July with the anterior non-STEMI, cath showed high-grade LAD, three-vessel disease with total occlusion of RCA, patient decided against bypass, after much discussion between cardiology and family, subsequently underwent PCI with DES to proximal LAD on 09 August 2018, subsequently started on aspirin Plavix statin and metoprolol, echo showed EF  of 35 to 40% -See above, discussed with cardiology Dr. Martinique this morning, high risk given recent stent less than 2 months ago, restart Plavix, hold aspirin -Restart metoprolol, continue statin  Ischemic cardiomyopathy/chronic systolic CHF/EF 32% -Continue Toprol, volume managed with HD  ESRD on hemodialysis Tuesday Thursday Saturday -Patient was dialyzed yesterday, nephrology consulted, due for HD tomorrow, appears euvolemic  Type 2 diabetes mellitus -Last A1c was 6.8 in July, patient on insulin 70/30 as needed at home, CBG stable, continue sliding scale  DVT prophylaxis: SCDs Code Status: Full code,  Family Communication: no family at bedside Disposition Plan: Home pending clinical stability, improvement  Consultants:   [facility] Dr. Cristina Gong  Discussed with cardiology Dr. Martinique   Procedures:   Antimicrobials:    Subjective: -Feels better, wondering when he can go home, denies any bleeding today, last episode was reportedly last night  Objective: Vitals:   09/28/18 0123 09/28/18 0207 09/28/18 0433 09/28/18 0912  BP: 119/63 (!) 105/58 103/64 122/63  Pulse: 83 81 79 85  Resp: 16 17 15 18   Temp: 99.3 F (37.4 C) 99.1 F (37.3 C) 98.6 F (37 C) 99.2 F (37.3 C)  TempSrc: Oral Oral Oral Oral  SpO2: 100% 100% 91% 93%  Weight:      Height:        Intake/Output Summary (Last 24 hours) at 09/28/2018 1155 Last data filed at 09/28/2018 1000 Gross per 24 hour  Intake 795.42 ml  Output 1 ml  Net 794.42 ml   Filed Weights   09/27/18 1611  Weight: 72.6 kg    Examination:  General exam: Awake alert oriented to self and place only Respiratory system: Clear bilaterally Cardiovascular system: S1 & S2 heard, RRR  Gastrointestinal system: Abdomen is nondistended, soft and nontender.Normal bowel sounds  heard. Central nervous system: Alert and oriented x2. No focal neurological deficits. Extremities: No edema Skin: No rashes, lesions or ulcers Psychiatry: Poor insight  and judgment    Data Reviewed:   CBC: Recent Labs  Lab 09/27/18 1907 09/28/18 0730  WBC 8.9 7.7  NEUTROABS 5.9  --   HGB 5.1* 7.9*  HCT 15.8* 23.2*  MCV 96.9 92.1  PLT 202 161   Basic Metabolic Panel: Recent Labs  Lab 09/27/18 1907 09/28/18 0730  NA 137 138  K 4.3 5.1  CL 97* 100  CO2 25 28  GLUCOSE 105* 93  BUN 28* 36*  CREATININE 4.42* 5.32*  CALCIUM 8.0* 8.0*   GFR: Estimated Creatinine Clearance: 9.8 mL/min (A) (by C-G formula based on SCr of 5.32 mg/dL (H)). Liver Function Tests: Recent Labs  Lab 09/27/18 1907  AST 21  ALT 18  ALKPHOS 70  BILITOT 0.2*  PROT 6.1*  ALBUMIN 2.9*   No results for input(s): LIPASE, AMYLASE in the last 168 hours. No results for input(s): AMMONIA in the last 168 hours. Coagulation Profile: Recent Labs  Lab 09/27/18 1907  INR 1.0   Cardiac Enzymes: No results for input(s): CKTOTAL, CKMB, CKMBINDEX, TROPONINI in the last 168 hours. BNP (last 3 results) No results for input(s): PROBNP in the last 8760 hours. HbA1C: No results for input(s): HGBA1C in the last 72 hours. CBG: Recent Labs  Lab 09/28/18 0123 09/28/18 0710 09/28/18 1113  GLUCAP 147* 94 99   Lipid Profile: No results for input(s): CHOL, HDL, LDLCALC, TRIG, CHOLHDL, LDLDIRECT in the last 72 hours. Thyroid Function Tests: No results for input(s): TSH, T4TOTAL, FREET4, T3FREE, THYROIDAB in the last 72 hours. Anemia Panel: Recent Labs    09/28/18 0730  VITAMINB12 462  FOLATE 10.7  FERRITIN 887*  TIBC 234*  IRON 120  RETICCTPCT 2.1   Urine analysis: No results found for: COLORURINE, APPEARANCEUR, LABSPEC, PHURINE, GLUCOSEU, HGBUR, BILIRUBINUR, KETONESUR, PROTEINUR, UROBILINOGEN, NITRITE, LEUKOCYTESUR Sepsis Labs: @LABRCNTIP (procalcitonin:4,lacticidven:4)  ) Recent Results (from the past 240 hour(s))  SARS Coronavirus 2 [facility] order, Performed in [facility] hospital lab) Nasopharyngeal Nasopharyngeal Swab     Status: None   Collection Time:  09/27/18  9:08 PM   Specimen: Nasopharyngeal Swab  Result Value Ref Range Status   SARS Coronavirus 2 NEGATIVE NEGATIVE Final    Comment: (NOTE) If result is NEGATIVE SARS-CoV-2 target nucleic acids are NOT DETECTED. The SARS-CoV-2 RNA is generally detectable in upper and lower  respiratory specimens during the acute phase of infection. The lowest  concentration of SARS-CoV-2 viral copies this assay can detect is 250  copies / mL. A negative result does not preclude SARS-CoV-2 infection  and should not be used as the sole basis for treatment or other  patient management decisions.  A negative result may occur with  improper specimen collection / handling, submission of specimen other  than nasopharyngeal swab, presence of viral mutation(s) within the  areas targeted by this assay, and inadequate number of viral copies  (<250 copies / mL). A negative result must be combined with clinical  observations, patient history, and epidemiological information. If result is POSITIVE SARS-CoV-2 target nucleic acids are DETECTED. The SARS-CoV-2 RNA is generally detectable in upper and lower  respiratory specimens dur ing the acute phase of infection.  Positive  results are indicative of active infection with SARS-CoV-2.  Clinical  correlation with patient history and other diagnostic information is  necessary to determine patient infection status.  Positive results do  not  rule out bacterial infection or co-infection with other viruses. If result is PRESUMPTIVE POSTIVE SARS-CoV-2 nucleic acids MAY BE PRESENT.   A presumptive positive result was obtained on the submitted specimen  and confirmed on repeat testing.  While 2019 novel coronavirus  (SARS-CoV-2) nucleic acids may be present in the submitted sample  additional confirmatory testing may be necessary for epidemiological  and / or clinical management purposes  to differentiate between  SARS-CoV-2 and other Sarbecovirus currently known to  infect humans.  If clinically indicated additional testing with an alternate test  methodology [phone]) is advised. The SARS-CoV-2 RNA is generally  detectable in upper and lower respiratory sp ecimens during the acute  phase of infection. The expected result is Negative. Fact Sheet for Patients:  StrictlyIdeas.no Fact Sheet for Healthcare Providers: BankingDealers.co.za This test is not yet approved or cleared by the [place] FDA and has been authorized for detection and/or diagnosis of SARS-CoV-2 by FDA under an Emergency Use Authorization (EUA).  This EUA will remain in effect (meaning this test can be used) for the duration of the COVID-19 declaration under Section 564(b)(1) of the Act, 21 U.S.C. section 360bbb-3(b)(1), unless the authorization is terminated or revoked sooner. Performed at [facility], [place] [address] [place], [place] [address]   MRSA PCR Screening     Status: None   Collection Time: 09/28/18  1:27 AM   Specimen: Nasal Mucosa; Nasopharyngeal  Result Value Ref Range Status   MRSA by PCR NEGATIVE NEGATIVE Final    Comment:        The GeneXpert MRSA Assay (FDA approved for NASAL specimens only), is one component of a comprehensive MRSA colonization surveillance program. It is not intended to diagnose MRSA infection nor to guide or monitor treatment for MRSA infections. Performed at [facility], [place] [address] [place], [place] [address]          Radiology Studies: No results found.      Scheduled Meds: . atorvastatin  80 mg Oral q1800  . cinacalcet  30 mg Oral Q breakfast  . insulin aspart  0-5 Units Subcutaneous QHS  . insulin aspart  0-9 Units Subcutaneous TID WC  . pantoprazole  40 mg Intravenous Q12H  . sevelamer carbonate  1,600 mg Oral TID WC   Continuous Infusions:   LOS: 0 days    Time spent: 47min    Domenic Polite, MD [facility]  09/28/2018, 11:55  AM

## 2018-09-28 NOTE — Progress Notes (Addendum)
New Admission Note: ? Arrival Method: via stretcher Mental Orientation: A/O x 4 Telemetry: Box #6 NSR Assessment: Completed Skin: Refer to flowsheet IV: Right Forearm Pain: none Tubes: Safety Measures: Safety Fall Prevention Plan discussed with patient. Admission: Completed [facility] [place] Orientation: Patient has been orientated to the room, unit and the staff. Family: Orders have been reviewed and are being implemented. Will continue to monitor the patient. Call light has been placed within reach and bed alarm has been activated.  ? [employer], [place]

## 2018-09-28 NOTE — Consult Note (Addendum)
Referring Provider:  Dr. Fanny Bien ([facility]) Primary Care Physician:  Carol Ada, MD Primary Gastroenterologist:  Dr. Cannon Kettle  Reason for Consultation: Hematochezia  HPI: Nicholas [name] is a 85 y.o. male admitted through the emergency room yesterday because of recurrent hematochezia.  The patient has an extensive past medical history which includes an MI with placement of a drug-eluting stent to the left anterior descending artery approximately 2 months ago, since which time he has been on aspirin and Plavix, EF in the range of 35 to 40%, and the need to have dual antiplatelet therapy for a total of at least 3 months if at all possible.  Other medical problems include end-stage renal disease on hemodialysis as well as hypertension and dyslipidemia.  His medical history is pertinent for having been hospitalized in [facility] last year for what the patient describes as florid hematochezia, at which time a cecal vascular malformation was ablated.  I have reviewed that report, and it is evident that it was the likely cause for the bleeding, because it was actively bleeding at the time of the exam with an overlying clot; after clot irrigation, a 10 mm angiodysplastic lesion was noted in that location.  Prior to that, in December 2016, Dr. Michail Sermon had performed endoscopy and colonoscopy in the patient for anemia, at which time he was noted to have scattered pancolonic diverticular disease.  With that background, the patient had a solitary episode of a bowel movement with some surrounding blood about a week ago, then several subsequent normal bowel movements over the ensuing days, and then 2 bowel movements last night, the first of which was brown stool with surrounding blood, and the second of which was just pure, dark red blood.  He never had any abdominal pain, nor any orthostatic symptoms.  In the emergency room, his hemoglobin was 5.1, as compared to 7.8 when checked 6 weeks  earlier.  Overnight, the patient has received 3 units of packed red cells, and has had an appropriate posttransfusion rise in hemoglobin to 7.9.      Past Medical History:  Diagnosis Date  . Arthritis   . Chronic kidney disease   . Diabetes mellitus ([place])   . Dialysis patient ([place])   . GERD (gastroesophageal reflux disease)    occ  . Heart murmur   . Hypercholesteremia   . Hypertension   . Psoriasis     Past Surgical History:  Procedure Laterality Date  . CARDIAC CATHETERIZATION  09   "some blockage" with collaterals by cath at [facility] ~ 2009; no intervention required; reportedly, no routine cardiology f/u recommended   . CORONARY ATHERECTOMY N/A 08/09/2018   Procedure: CORONARY ATHERECTOMY;  Surgeon: Jettie Booze, MD;  Location: [place] [facility];  Service: Cardiovascular;  Laterality: N/A;  . CORONARY STENT INTERVENTION N/A 08/09/2018   Procedure: CORONARY STENT INTERVENTION;  Surgeon: Jettie Booze, MD;  Location: [place] [facility];  Service: Cardiovascular;  Laterality: N/A;  . INGUINAL HERNIA REPAIR Right 04/13/2012   Procedure: HERNIA REPAIR INGUINAL INCARCERATED;  Surgeon: Gayland Curry, MD;  Location: [place];  Service: General;  Laterality: Right;  . INSERTION OF MESH Right 04/13/2012   Procedure: INSERTION OF MESH;  Surgeon: Gayland Curry, MD;  Location: [place];  Service: General;  Laterality: Right;  . LEFT HEART CATH N/A 08/09/2018   Procedure: Left Heart Cath;  Surgeon: Jettie Booze, MD;  Location: [place] [facility];  Service: Cardiovascular;  Laterality: N/A;  .  LEFT HEART CATH AND CORONARY ANGIOGRAPHY N/A 08/08/2018   Procedure: LEFT HEART CATH AND CORONARY ANGIOGRAPHY;  Surgeon: Troy Sine, MD;  Location: [place] [facility];  Service: Cardiovascular;  Laterality: N/A;  . VASECTOMY      Prior to Admission medications   Medication Sig Start Date End Date Taking? Authorizing Provider  aspirin 81 MG chewable tablet Chew 1 tablet (81 mg total) by  mouth daily. 08/11/18  Yes [name], MD  atorvastatin (LIPITOR) 80 MG tablet Take 1 tablet (80 mg total) by mouth daily at 6 PM. 08/10/18  Yes [name], MD  cinacalcet (SENSIPAR) 30 MG tablet Take 30 mg by mouth daily.   Yes [provider]  clopidogrel (PLAVIX) 75 MG tablet Take 1 tablet (75 mg total) by mouth daily with breakfast. 08/11/18  Yes [name], MD  insulin NPH-insulin regular (NOVOLIN 70/30) (70-30) 100 UNIT/ML injection Inject 20 Units into the skin 2 (two) times daily as needed (CBG >100).    Yes [provider]  metoprolol succinate (TOPROL-XL) 25 MG 24 hr tablet Take 1 tablet (25 mg total) by mouth See admin instructions. Take 1/2 tablet (25 mg) on non dialysis days -  Sunday, Monday, Wednesday, Friday. Take with or immediately following a meal. 08/10/18  Yes [name], MD  nitroGLYCERIN (NITROSTAT) 0.4 MG SL tablet Place 1 tablet (0.4 mg total) under the tongue every 5 (five) minutes x 3 doses as needed for chest pain. 08/10/18  Yes [name], MD  PROTEIN PO Take 237 mLs by mouth daily.   Yes [provider]  sevelamer carbonate (RENVELA) 800 MG tablet Take 1,600 mg by mouth 3 (three) times daily with meals.  05/09/18  Yes [provider]    Current Facility-Administered Medications  Medication Dose Route Frequency Provider Last Rate Last Dose  . acetaminophen (TYLENOL) tablet 650 mg  650 mg Oral Q6H PRN Ivor Costa, MD       Or  . acetaminophen (TYLENOL) suppository 650 mg  650 mg Rectal Q6H PRN Ivor Costa, MD      . atorvastatin (LIPITOR) tablet 80 mg  80 mg Oral q1800 Ivor Costa, MD      . cinacalcet [facility]) tablet 30 mg  30 mg Oral Q breakfast Ivor Costa, MD      . clopidogrel (PLAVIX) tablet 75 mg  75 mg Oral Daily Domenic Polite, MD   75 mg at 09/28/18 1319  . insulin aspart (novoLOG) injection 0-5 Units  0-5 Units Subcutaneous QHS Ivor Costa, MD      . insulin aspart (novoLOG)  injection 0-9 Units  0-9 Units Subcutaneous TID WC Ivor Costa, MD      . metoprolol succinate (TOPROL-XL) 24 hr tablet 25 mg  25 mg Oral Daily Domenic Polite, MD   25 mg at 09/28/18 1319  . nitroGLYCERIN (NITROSTAT) SL tablet 0.4 mg  0.4 mg Sublingual Q5 Min x 3 PRN Ivor Costa, MD      . ondansetron [facility]) tablet 4 mg  4 mg Oral Q6H PRN Ivor Costa, MD       Or  . ondansetron (ZOFRAN) injection 4 mg  4 mg Intravenous Q6H PRN Ivor Costa, MD      . pantoprazole (PROTONIX) injection 40 mg  40 mg Intravenous Q12H Ivor Costa, MD   40 mg at 09/28/18 1038  . sevelamer carbonate (RENVELA) tablet 1,600 mg  1,600 mg Oral TID WC Ivor Costa, MD        Allergies as  of 09/27/2018 - Review Complete 09/27/2018  Allergen Reaction Noted  . Hydrochlorothiazide Other (See Comments) 02/21/2017    Family History  Problem Relation Age of Onset  . Diabetes Mother   . Stroke Father   . Diabetes Sister   . Diabetes Brother     Social History   Socioeconomic History  . Marital status: Married    Spouse name: Not on file  . Number of children: Not on file  . Years of education: Not on file  . Highest education level: Not on file  Occupational History  . Not on file  Social Needs  . Financial resource strain: Not on file  . Food insecurity    Worry: Not on file    Inability: Not on file  . Transportation needs    Medical: Not on file    Non-medical: Not on file  Tobacco Use  . Smoking status: Former Smoker    Packs/day: 1.00    Years: 15.00    Pack years: 15.00    Types: Cigarettes    Quit date: 03/29/1987    Years since quitting: 31.5  . Smokeless tobacco: Never Used  Substance and Sexual Activity  . Alcohol use: No  . Drug use: No  . Sexual activity: Not on file  Lifestyle  . Physical activity    Days per week: Not on file    Minutes per session: Not on file  . Stress: Not on file  Relationships  . Social Herbalist on phone: Not on file    Gets together: Not on file     Attends religious service: Not on file    Active member of club or organization: Not on file    Attends meetings of clubs or organizations: Not on file    Relationship status: Not on file  . Intimate partner violence    Fear of current or ex partner: Not on file    Emotionally abused: Not on file    Physically abused: Not on file    Forced sexual activity: Not on file  Other Topics Concern  . Not on file  Social History Narrative  . Not on file    Review of Systems: Negative for chest pain or shortness of breath.  He does endorse mild lower extremity edema.  No prodromal dyspeptic symptomatology.  Physical Exam: Vital signs in last 24 hours: Temp:  [97.9 F (36.6 C)-99.3 F (37.4 C)] 99.2 F (37.3 C) (09/04 0912) Pulse Rate:  [79-97] 85 (09/04 0912) Resp:  [12-24] 18 (09/04 0912) BP: (94-129)/(46-68) 122/63 (09/04 0912) SpO2:  [91 %-100 %] 93 % (09/04 0912) Weight:  [72.6 kg] 72.6 kg (09/03 1611) Last BM Date: 09/28/18 General:   Alert,  Well-developed, well-nourished, pleasant and cooperative in NAD Head:  Normocephalic and atraumatic. Eyes:  Sclera clear, no icterus.   Lungs:  Clear throughout to auscultation.   No wheezes, crackles, or rhonchi. No evident respiratory distress. Heart:   Regular rate and rhythm; no murmurs, clicks, rubs,  or gallops. Abdomen:  Soft, nontender, nontympanitic, and slightly adipose but nondistended.  No evident ascites (flank tympany present). Rectal: [place] pasty blood present on examining finger Msk:   Symmetrical without gross deformities. Extremities:   Trace tibial edema present. Neurologic:  Alert and coherent;  grossly normal neurologically. Skin:  Intact without significant lesions or rashes. Psych:   Alert and cooperative. Normal mood and affect.  Intake/Output from previous day: 09/03 0701 - 09/04 0700 In: 795.4 [Blood:795.4]  Out: 0  Intake/Output this shift: Total I/O In: -  Out: 1 [Stool:1]  Lab Results: Recent Labs     09/27/18 1907 09/28/18 0730  WBC 8.9 7.7  HGB 5.1* 7.9*  HCT 15.8* 23.2*  PLT 202 151   BMET Recent Labs    09/27/18 1907 09/28/18 0730  NA 137 138  K 4.3 5.1  CL 97* 100  CO2 25 28  GLUCOSE 105* 93  BUN 28* 36*  CREATININE 4.42* 5.32*  CALCIUM 8.0* 8.0*   LFT Recent Labs    09/27/18 1907  PROT 6.1*  ALBUMIN 2.9*  AST 21  ALT 18  ALKPHOS 70  BILITOT 0.2*   PT/INR Recent Labs    09/27/18 1907  LABPROT 13.2  INR 1.0    Studies/Results: No results found.  Impression: The character of the bleeding in the absence of hemodynamic instability suggests a lower tract source, rather than upper tract source even though the patient is on aspirin.  No doubt, the bleeding is being exacerbated by his dual antiplatelet therapy, but so far, has not been catastrophic.  I think the main differential diagnosis is between recurrent bleeding from colonic vascular ectasia, versus colonic diverticular bleeding.  Plan: I would favor expectant management for now, particularly since the patient is only 2 months status post MI, and since diverticular bleeding, in particular, often stops spontaneously.  It is acceptable to consider resuming 1 or both of the patient's antiplatelet agents, considering how recently he had his drug-eluting stent placed.  I agree with empiric PPI therapy although I think ulcer disease as the source of the bleeding is unlikely.  I will initiate a gentle prep with a clear liquid diet and some MiraLAX to help flush the blood out of the colon.  This will make it easier to tell whether he is having ongoing bleeding, as well as prepare the patient for colonoscopy if it should be needed.   LOS: 0 days   Youlanda Mighty Estuardo Frisbee  09/28/2018, 2:40 PM   Pager [phone] If no answer or after 5 PM call [phone]

## 2018-09-28 NOTE — Plan of Care (Signed)
  Problem: Education: Goal: Knowledge of General Education information will improve Description: Including pain rating scale, medication(s)/side effects and non-pharmacologic comfort measures Outcome: Progressing   Problem: Health Behavior/Discharge Planning: Goal: Ability to manage health-related needs will improve Outcome: Progressing   Problem: Clinical Measurements: Goal: Respiratory complications will improve Outcome: Progressing   

## 2018-09-28 NOTE — Consult Note (Addendum)
[facility] Renal Consultation Note    Indication for Consultation:  Management of ESRD/hemodialysis; anemia, hypertension/volume and secondary hyperparathyroidism PCP: Dr. Alben Spittle  HPI: Nicholas [name] is a very pleasant 85 y.o. male with ESRD on hemodialysis T,Th,S at [place] [place] [facility]. PMH of CAD, recent adm 07/2018 with NSTEMI S/P DES to LAD 38/75/6433, combined systolic and diastolic HF, DM, HTN, AOCD, SHPT.   Patient presented to ED 09/27/2018 with C/O painless,rectal bleeding. HGB 5.1on admission. He has received 3 units PRBCs, HGB now 7.9. GI has been consulted, no procedures or interventions planned at present. He currently has no C/Os, is sitting at bedside, drinking clear liquids. He denies chest pain, SOB, fever, chills, nausea or vomiting. No abdominal pain. Endoscopy/colonoscopy 01/09/2019 noted for pancolonic diverticular disease.   Past Medical History:  Diagnosis Date  . Arthritis   . Chronic kidney disease   . Diabetes mellitus ([place])   . Dialysis patient ([place])   . GERD (gastroesophageal reflux disease)    occ  . Heart murmur   . Hypercholesteremia   . Hypertension   . Psoriasis    Past Surgical History:  Procedure Laterality Date  . CARDIAC CATHETERIZATION  09   "some blockage" with collaterals by cath at [facility] ~ 2009; no intervention required; reportedly, no routine cardiology f/u recommended   . CORONARY ATHERECTOMY N/A 08/09/2018   Procedure: CORONARY ATHERECTOMY;  Surgeon: Jettie Booze, MD;  Location: [place] [facility];  Service: Cardiovascular;  Laterality: N/A;  . CORONARY STENT INTERVENTION N/A 08/09/2018   Procedure: CORONARY STENT INTERVENTION;  Surgeon: Jettie Booze, MD;  Location: [place] [facility];  Service: Cardiovascular;  Laterality: N/A;  . INGUINAL HERNIA REPAIR Right 04/13/2012   Procedure: HERNIA REPAIR INGUINAL INCARCERATED;  Surgeon: Gayland Curry, MD;  Location: [place];  Service: General;  Laterality:  Right;  . INSERTION OF MESH Right 04/13/2012   Procedure: INSERTION OF MESH;  Surgeon: Gayland Curry, MD;  Location: [place];  Service: General;  Laterality: Right;  . LEFT HEART CATH N/A 08/09/2018   Procedure: Left Heart Cath;  Surgeon: Jettie Booze, MD;  Location: [place] [facility];  Service: Cardiovascular;  Laterality: N/A;  . LEFT HEART CATH AND CORONARY ANGIOGRAPHY N/A 08/08/2018   Procedure: LEFT HEART CATH AND CORONARY ANGIOGRAPHY;  Surgeon: Troy Sine, MD;  Location: [place] [facility];  Service: Cardiovascular;  Laterality: N/A;  . VASECTOMY     Family History  Problem Relation Age of Onset  . Diabetes Mother   . Stroke Father   . Diabetes Sister   . Diabetes Brother    Social History:  reports that he quit smoking about 31 years ago. His smoking use included cigarettes. He has a 15.00 pack-year smoking history. He has never used smokeless tobacco. He reports that he does not drink alcohol or use drugs. Allergies  Allergen Reactions  . Hydrochlorothiazide Other (See Comments)    [facility]   Prior to Admission medications   Medication Sig Start Date End Date Taking? Authorizing Provider  aspirin 81 MG chewable tablet Chew 1 tablet (81 mg total) by mouth daily. 08/11/18  Yes [name], MD  atorvastatin (LIPITOR) 80 MG tablet Take 1 tablet (80 mg total) by mouth daily at 6 PM. 08/10/18  Yes [name], MD  cinacalcet (SENSIPAR) 30 MG tablet Take 30 mg by mouth daily.   Yes [provider]  clopidogrel (PLAVIX) 75 MG tablet Take 1 tablet (  75 mg total) by mouth daily with breakfast. 08/11/18  Yes [name], MD  insulin NPH-insulin regular (NOVOLIN 70/30) (70-30) 100 UNIT/ML injection Inject 20 Units into the skin 2 (two) times daily as needed (CBG >100).    Yes [provider]  metoprolol succinate (TOPROL-XL) 25 MG 24 hr tablet Take 1 tablet (25 mg total) by mouth See admin instructions. Take 1/2 tablet (25 mg)  on non dialysis days -  Sunday, Monday, Wednesday, Friday. Take with or immediately following a meal. 08/10/18  Yes [name], MD  nitroGLYCERIN (NITROSTAT) 0.4 MG SL tablet Place 1 tablet (0.4 mg total) under the tongue every 5 (five) minutes x 3 doses as needed for chest pain. 08/10/18  Yes [name], MD  PROTEIN PO Take 237 mLs by mouth daily.   Yes [provider]  sevelamer carbonate (RENVELA) 800 MG tablet Take 1,600 mg by mouth 3 (three) times daily with meals.  05/09/18  Yes [provider]   Current Facility-Administered Medications  Medication Dose Route Frequency Provider Last Rate Last Dose  . acetaminophen (TYLENOL) tablet 650 mg  650 mg Oral Q6H PRN Ivor Costa, MD       Or  . acetaminophen (TYLENOL) suppository 650 mg  650 mg Rectal Q6H PRN Ivor Costa, MD      . atorvastatin (LIPITOR) tablet 80 mg  80 mg Oral q1800 Ivor Costa, MD      . cinacalcet [facility]) tablet 30 mg  30 mg Oral Q breakfast Ivor Costa, MD      . clopidogrel (PLAVIX) tablet 75 mg  75 mg Oral Daily Domenic Polite, MD   75 mg at 09/28/18 1319  . insulin aspart (novoLOG) injection 0-5 Units  0-5 Units Subcutaneous QHS Ivor Costa, MD      . insulin aspart (novoLOG) injection 0-9 Units  0-9 Units Subcutaneous TID WC Ivor Costa, MD      . metoprolol succinate (TOPROL-XL) 24 hr tablet 25 mg  25 mg Oral Daily Domenic Polite, MD   25 mg at 09/28/18 1319  . nitroGLYCERIN (NITROSTAT) SL tablet 0.4 mg  0.4 mg Sublingual Q5 Min x 3 PRN Ivor Costa, MD      . ondansetron [facility]) tablet 4 mg  4 mg Oral Q6H PRN Ivor Costa, MD       Or  . ondansetron (ZOFRAN) injection 4 mg  4 mg Intravenous Q6H PRN Ivor Costa, MD      . pantoprazole (PROTONIX) injection 40 mg  40 mg Intravenous Q12H Ivor Costa, MD   40 mg at 09/28/18 1038  . polyethylene glycol (MIRALAX / GLYCOLAX) packet 17 g  17 g Oral BID Buccini, Truth Wolaver, MD      . sevelamer carbonate (RENVELA) tablet 1,600 mg  1,600 mg Oral TID WC  Ivor Costa, MD       Labs: Basic Metabolic Panel: Recent Labs  Lab 09/27/18 1907 09/28/18 0730  NA 137 138  K 4.3 5.1  CL 97* 100  CO2 25 28  GLUCOSE 105* 93  BUN 28* 36*  CREATININE 4.42* 5.32*  CALCIUM 8.0* 8.0*   Liver Function Tests: Recent Labs  Lab 09/27/18 1907  AST 21  ALT 18  ALKPHOS 70  BILITOT 0.2*  PROT 6.1*  ALBUMIN 2.9*   No results for input(s): LIPASE, AMYLASE in the last 168 hours. No results for input(s): AMMONIA in the last 168 hours. CBC: Recent Labs  Lab 09/27/18 1907 09/28/18 0730  WBC 8.9 7.7  NEUTROABS 5.9  --  HGB 5.1* 7.9*  HCT 15.8* 23.2*  MCV 96.9 92.1  PLT 202 151   Cardiac Enzymes: No results for input(s): CKTOTAL, CKMB, CKMBINDEX, TROPONINI in the last 168 hours. CBG: Recent Labs  Lab 09/28/18 0123 09/28/18 0710 09/28/18 1113  GLUCAP 147* 94 99   Iron Studies:  Recent Labs    09/28/18 0730  IRON 120  TIBC 234*  FERRITIN 887*   Studies/Results: No results found.  ROS: As per HPI otherwise negative.   Physical Exam: Vitals:   09/28/18 0123 09/28/18 0207 09/28/18 0433 09/28/18 0912  BP: 119/63 (!) 105/58 103/64 122/63  Pulse: 83 81 79 85  Resp: 16 17 15 18   Temp: 99.3 F (37.4 C) 99.1 F (37.3 C) 98.6 F (37 C) 99.2 F (37.3 C)  TempSrc: Oral Oral Oral Oral  SpO2: 100% 100% 91% 93%  Weight:      Height:         General: Pleasant, elderly male in no acute distress. Head: Normocephalic, atraumatic, sclera non-icteric, mucus membranes are moist Neck: Supple. JVD elevated 1/4 to mandible. Lungs: Clear bilaterally to auscultation without wheezes, rales, or rhonchi. Breathing is unlabored. Heart: RRR with S1 S2. SR on monitor. HR 80s.  Abdomen: Soft, non-tender, non-distended with normoactive bowel sounds. No rebound/guarding. No obvious abdominal masses. M-S:  Strength and tone appear normal for age. Lower extremities:2+ bilateral pitting edema Neuro: Alert and oriented X 3. Moves all extremities  spontaneously. Psych:  Responds to questions appropriately with a normal affect. Dialysis Access: L AVF + bruit  [facility] Have called for records   Assessment/Plan: 1.  Symptomatic anemia/possible lower GIB-Per primary. GI consulted. Conservative management for now.  Follow HGB, transfuse as needed.  2. H/O CAD-Recent NSTEMI. Per primary 3.  ESRD -  T,Th,S. HD tomorrow on schedule. Hold heparin.  4.  Hypertension/volume  - BP well controlled. Has mild JVD/2+pitting edema. Reports OP EDW 72.5 kg, current wt 72.6 kg. Get standing wt in HD tomorrow. Needs volume lowered. UF as tolerated 2-2.5 liters.  5.  Anemia  -HGB 5.1 on admission. Now S/P 3 units of PRBCs, HGB 7.9. Follow HGB, transfuse as needed.   6.  Metabolic bone disease - Continue binders, have requested records from [place].  7.  Nutrition - clear liquids at present. Albumin 2.9. Renal/Carb mod diet when eating. Renal vit/prostat.  8. DM-per primary 9. CAD - hx PCI/ intervention  Rita H. Owens Shark, NP-C 09/28/2018, 4:02 PM  [employer] [phone]  Pt seen, examined and agree w A/P as above.  Kelly Splinter  MD 09/28/2018, 5:17 PM

## 2018-09-29 ENCOUNTER — Other Ambulatory Visit: Payer: Self-pay

## 2018-09-29 LAB — CBC
HCT: 22.8 % — ABNORMAL LOW (ref 39.0–52.0)
HCT: 22.2 % — ABNORMAL LOW (ref 39.0–52.0)
Hemoglobin: 7.6 g/dL — ABNORMAL LOW (ref 13.0–17.0)
Hemoglobin: 7.5 g/dL — ABNORMAL LOW (ref 13.0–17.0)
MCH: 31.3 pg (ref 26.0–34.0)
MCH: 31.3 pg (ref 26.0–34.0)
MCHC: 33.3 g/dL (ref 30.0–36.0)
MCHC: 33.8 g/dL (ref 30.0–36.0)
MCV: 93.8 fL (ref 80.0–100.0)
MCV: 92.5 fL (ref 80.0–100.0)
Platelets: 155 10*3/uL (ref 150–400)
Platelets: 153 10*3/uL (ref 150–400)
RBC: 2.43 MIL/uL — ABNORMAL LOW (ref 4.22–5.81)
RBC: 2.4 MIL/uL — ABNORMAL LOW (ref 4.22–5.81)
RDW: 15.4 % (ref 11.5–15.5)
RDW: 15.1 % (ref 11.5–15.5)
WBC: 8.8 10*3/uL (ref 4.0–10.5)
WBC: 8.9 10*3/uL (ref 4.0–10.5)
nRBC: 0 % (ref 0.0–0.2)
nRBC: 0 % (ref 0.0–0.2)

## 2018-09-29 LAB — BASIC METABOLIC PANEL
Anion gap: 15 (ref 5–15)
BUN: 52 mg/dL — ABNORMAL HIGH (ref 8–23)
CO2: 25 mmol/L (ref 22–32)
Calcium: 8.1 mg/dL — ABNORMAL LOW (ref 8.9–10.3)
Chloride: 98 mmol/L (ref 98–111)
Creatinine, Ser: 7.38 mg/dL — ABNORMAL HIGH (ref 0.61–1.24)
GFR calc Af Amer: 7 mL/min — ABNORMAL LOW (ref >60)
GFR calc non Af Amer: 6 mL/min — ABNORMAL LOW (ref >60)
Glucose, Bld: 87 mg/dL (ref 70–99)
Potassium: 5.4 mmol/L — ABNORMAL HIGH (ref 3.5–5.1)
Sodium: 138 mmol/L (ref 135–145)

## 2018-09-29 LAB — HEPATITIS B SURFACE ANTIGEN: Hepatitis B Surface Ag: NEGATIVE

## 2018-09-29 LAB — GLUCOSE, CAPILLARY
Glucose-Capillary: 80 mg/dL (ref 70–99)
Glucose-Capillary: 142 mg/dL — ABNORMAL HIGH (ref 70–99)
Glucose-Capillary: 147 mg/dL — ABNORMAL HIGH (ref 70–99)

## 2018-09-29 NOTE — Plan of Care (Signed)
  Problem: Education: Goal: Knowledge of General Education information will improve Description: Including pain rating scale, medication(s)/side effects and non-pharmacologic comfort measures Outcome: Progressing   Problem: Clinical Measurements: Goal: Respiratory complications will improve Outcome: Progressing   

## 2018-09-29 NOTE — Progress Notes (Signed)
1 burgundy BM last night. None since (over 12 hrs)  Hgb basically stable (7.9 to 7.5 past 24 hrs) without transfusion during that interval.  Pt on Plavix only (ASA on hold).  IMPR:  Doubt active/ongoing bleeding, although can't exclude a slow ongoing ooze.  RECOMM:  1. Advance diet   2. Continue observation while monitoring CBC q 12hr  3. Agree w/ plan for dischg tomorrow if stable; agree w/ re-starting ASA upon dischg, given recency of pt's MI/stent.  Cleotis Nipper, M.D. Pager [phone] If no answer or after 5 PM call [phone]

## 2018-09-29 NOTE — Progress Notes (Signed)
Patient had a [place] stool and clots at 19:15pm.

## 2018-09-29 NOTE — Progress Notes (Signed)
[facility] Progress Note  Subjective: seen on HD, still having bloody stools per pt  Vitals:   09/29/18 0830 09/29/18 0900 09/29/18 0930 09/29/18 1000  BP: (!) 107/59 (!) 111/53 115/65 123/63  Pulse: 75 75 82 79  Resp:      Temp:      TempSrc:      SpO2:      Weight:      Height:        Inpatient medications: . atorvastatin  80 mg Oral q1800  . Chlorhexidine Gluconate Cloth  6 each Topical Q0600  . cinacalcet  30 mg Oral Q breakfast  . clopidogrel  75 mg Oral Daily  . insulin aspart  0-5 Units Subcutaneous QHS  . insulin aspart  0-9 Units Subcutaneous TID WC  . metoprolol succinate  25 mg Oral Daily  . pantoprazole  40 mg Intravenous Q12H  . polyethylene glycol  17 g Oral BID  . sevelamer carbonate  1,600 mg Oral TID WC    acetaminophen **OR** acetaminophen, nitroGLYCERIN, ondansetron **OR** ondansetron (ZOFRAN) IV    Exam: General: Pleasant, elderly male in no acute distress. Neck: Supple. JVD elevated 1/4 to mandible. Lungs: Clear bilaterally to auscultation without wheezes, rales, or rhonchi Heart: RRR with S1 S2. SR on monitor. HR 80s.  Abdomen: Soft, non-tender, non-distended with normoactive bowel sounds. No rebound/guarding. No obvious abdominal masses. M-S:  Strength and tone appear normal for age. Lower extremities:2+ bilateral pitting edema Neuro: Alert and oriented X 3. Moves all extremities spontaneously. Dialysis Access: L AVF + bruit  [facility] Have called for records   Assessment/Plan: 1.  Symptomatic anemia/possible lower GIB-Per primary. GI consulted. Conservative management for now.  Follow HGB, transfuse as needed.  2. H/O CAD-Recent NSTEMI. Per primary 3.  ESRD -  HD TTS. HD today. Hold heparin d/t GIB  4.  Hypertension/volume  - BP well controlled. Has mild JVD/2+pitting edema. UF 2-3 L on HD today. Get OP records soon.  5.  Anemia  -HGB 5.1 on admission. Now S/P 3 units of PRBCs, HGB 7's  today. Transfuse as needed.   6.  Metabolic bone disease - Continue binders, have requested records from [place].  7.  Nutrition - clear liquids at present. Albumin 2.9. Renal/Carb mod diet when eating. Renal vit/prostat.  8. DM-per primary 9. CAD - hx PCI/ intervention     [facility] 09/29/2018, 10:24 AM  Iron/TIBC/Ferritin/ %Sat    Component Value Date/Time   IRON 120 09/28/2018 0730   TIBC 234 (L) 09/28/2018 0730   FERRITIN 887 (H) 09/28/2018 0730   IRONPCTSAT 51 (H) 09/28/2018 0730   Recent Labs  Lab 09/27/18 1907  09/29/18 0523  NA 137   < > 138  K 4.3   < > 5.4*  CL 97*   < > 98  CO2 25   < > 25  GLUCOSE 105*   < > 87  BUN 28*   < > 52*  CREATININE 4.42*   < > 7.38*  CALCIUM 8.0*   < > 8.1*  ALBUMIN 2.9*  --   --   INR 1.0  --   --    < > = values in this interval not displayed.   Recent Labs  Lab 09/27/18 1907  AST 21  ALT 18  ALKPHOS 70  BILITOT 0.2*  PROT 6.1*   Recent Labs  Lab 09/29/18 0744  WBC 8.9  HGB 7.5*  HCT  22.2*  PLT 153

## 2018-09-29 NOTE — Progress Notes (Signed)
PROGRESS NOTE    Nicholas [name]  UQJ:[id number] DOB: 1930/01/29 DOA: 09/27/2018 PCP: Carol Ada, MD  Brief Narrative: 85 year old male with history of ESRD on hemodialysis Tuesday Thursday Saturday, CAD, chronic systolic CHF, hypertension, dyslipidemia. -He was admitted in July with the anterior non-STEMI, cath showed high-grade LAD, three-vessel disease with total occlusion of RCA, patient decided against bypass, subsequently underwent PCI with DES to proximal LAD, subsequently started on aspirin Plavix statin and metoprolol, echo showed EF of 35 to 40%, getting beyond 3 months of dual antiplatelet therapy was felt to be key if possible per Cardiologist Dr.Smith -Now admitted with lower GI bleed, hemoglobin of 5.1, gastroenterology following, cardiology notified, conservative management status post blood transfusion  Assessment & Plan:   Symptomatic anemia, suspected lower GI bleed -Long history of chronic anemia with iron deficiency and chronic disease/CKD -Now admitted with hemoglobin of 5.1, reported hematochezia, in the setting of dual antiplatelet therapy, -Last colonoscopy at [facility] in 2019 noted cecal AVM status post ablation [facility] gastroenterology consulted -Upper GI bleed appears less likely, patient is a very poor historian, BUN is not considerably elevated from baseline -Aspirin and Plavix were held on admission, I called and discussed with cardiology, Dr. Martinique 9/4, given recent LAD stent less than 2 months ago, started Plavix yesterday, hold aspirin and monitor clinical course -1 maroon stool last night, hemoglobin relatively stable -Continue PPI, changed to p.o., appreciate gastroenterology input -Home tomorrow if stable  CAD/multivessel disease -admitted in July with the anterior non-STEMI, cath showed high-grade LAD, three-vessel disease with total occlusion of RCA, patient decided against bypass, after much discussion between cardiology and family,  subsequently underwent PCI with DES to proximal LAD on 09 August 2018, subsequently started on aspirin Plavix statin and metoprolol, echo showed EF of 35 to 40% -See above, discussed with cardiology Dr. Martinique 9/4 morning, high risk given recent stent less than 2 months ago, restarted Plavix, hold aspirin -Continue metoprolol and statin  Ischemic cardiomyopathy/chronic systolic CHF/EF 38% -Continue Toprol, volume managed with HD  ESRD on hemodialysis Tuesday Thursday Saturday -Patient was dialyzed yesterday, nephrology consulted, underwent dialysis today  Hyperkalemia -This will be corrected with HD today  Type 2 diabetes mellitus -Last A1c was 6.8 in July, patient on insulin 70/30 as needed at home, CBG stable, continue sliding scale -Does not need insulin at this time  DVT prophylaxis: SCDs Code Status: Full code,  Family Communication: no family at bedside Disposition Plan: Home pending clinical stability, improvement  Consultants:   [facility] Dr. Cristina Gong  Discussed with cardiology Dr. Martinique   Procedures:   Antimicrobials:    Subjective: -1 maroon stool last night, none further, wants to eat  Objective: Vitals:   09/29/18 0930 09/29/18 1000 09/29/18 1110 09/29/18 1200  BP: 115/65 123/63 125/62 124/62  Pulse: 82 79 83 87  Resp:   18 18  Temp:   97.8 F (36.6 C) 98.4 F (36.9 C)  TempSrc:   Oral Oral  SpO2:   98% 100%  Weight:      Height:        Intake/Output Summary (Last 24 hours) at 09/29/2018 1358 Last data filed at 09/29/2018 0746 Gross per 24 hour  Intake 300 ml  Output 1 ml  Net 299 ml   Filed Weights   09/27/18 1611 09/29/18 0303 09/29/18 0700  Weight: 72.6 kg 72.5 kg 72.3 kg    Examination:  Gen: Awake, Alert, Oriented X3 elderly chronically ill, no distress, mild cognitive deficits HEENT: PERRLA,  Neck supple, no JVD Lungs: Clear bilaterally CVS: RRR,No Gallops,Rubs or new Murmurs Abd: soft, Non tender, non distended, BS present Extremities:  Trace edema Skin: no new rashes Psychiatry: Poor insight and judgment    Data Reviewed:   CBC: Recent Labs  Lab 09/27/18 1907 09/28/18 0730 09/28/18 1759 09/29/18 0523 09/29/18 0744  WBC 8.9 7.7 7.7 8.8 8.9  NEUTROABS 5.9  --   --   --   --   HGB 5.1* 7.9* 8.0* 7.6* 7.5*  HCT 15.8* 23.2* 23.2* 22.8* 22.2*  MCV 96.9 92.1 91.7 93.8 92.5  PLT 202 151 159 155 440   Basic Metabolic Panel: Recent Labs  Lab 09/27/18 1907 09/28/18 0730 09/29/18 0523  NA 137 138 138  K 4.3 5.1 5.4*  CL 97* 100 98  CO2 25 28 25   GLUCOSE 105* 93 87  BUN 28* 36* 52*  CREATININE 4.42* 5.32* 7.38*  CALCIUM 8.0* 8.0* 8.1*   GFR: Estimated Creatinine Clearance: 7.1 mL/min (A) (by C-G formula based on SCr of 7.38 mg/dL (H)). Liver Function Tests: Recent Labs  Lab 09/27/18 1907  AST 21  ALT 18  ALKPHOS 70  BILITOT 0.2*  PROT 6.1*  ALBUMIN 2.9*   No results for input(s): LIPASE, AMYLASE in the last 168 hours. No results for input(s): AMMONIA in the last 168 hours. Coagulation Profile: Recent Labs  Lab 09/27/18 1907  INR 1.0   Cardiac Enzymes: No results for input(s): CKTOTAL, CKMB, CKMBINDEX, TROPONINI in the last 168 hours. BNP (last 3 results) No results for input(s): PROBNP in the last 8760 hours. HbA1C: No results for input(s): HGBA1C in the last 72 hours. CBG: Recent Labs  Lab 09/28/18 0710 09/28/18 1113 09/28/18 1608 09/28/18 2137 09/29/18 1200  GLUCAP 94 99 169* 88 80   Lipid Profile: No results for input(s): CHOL, HDL, LDLCALC, TRIG, CHOLHDL, LDLDIRECT in the last 72 hours. Thyroid Function Tests: No results for input(s): TSH, T4TOTAL, FREET4, T3FREE, THYROIDAB in the last 72 hours. Anemia Panel: Recent Labs    09/28/18 0730  VITAMINB12 462  FOLATE 10.7  FERRITIN 887*  TIBC 234*  IRON 120  RETICCTPCT 2.1   Urine analysis: No results found for: COLORURINE, APPEARANCEUR, LABSPEC, PHURINE, GLUCOSEU, HGBUR, BILIRUBINUR, KETONESUR, PROTEINUR, UROBILINOGEN,  NITRITE, LEUKOCYTESUR Sepsis Labs: @LABRCNTIP (procalcitonin:4,lacticidven:4)  ) Recent Results (from the past 240 hour(s))  SARS Coronavirus 2 [facility] order, Performed in [facility] hospital lab) Nasopharyngeal Nasopharyngeal Swab     Status: None   Collection Time: 09/27/18  9:08 PM   Specimen: Nasopharyngeal Swab  Result Value Ref Range Status   SARS Coronavirus 2 NEGATIVE NEGATIVE Final    Comment: (NOTE) If result is NEGATIVE SARS-CoV-2 target nucleic acids are NOT DETECTED. The SARS-CoV-2 RNA is generally detectable in upper and lower  respiratory specimens during the acute phase of infection. The lowest  concentration of SARS-CoV-2 viral copies this assay can detect is 250  copies / mL. A negative result does not preclude SARS-CoV-2 infection  and should not be used as the sole basis for treatment or other  patient management decisions.  A negative result may occur with  improper specimen collection / handling, submission of specimen other  than nasopharyngeal swab, presence of viral mutation(s) within the  areas targeted by this assay, and inadequate number of viral copies  (<250 copies / mL). A negative result must be combined with clinical  observations, patient history, and epidemiological information. If result is POSITIVE SARS-CoV-2 target nucleic acids are DETECTED. The SARS-CoV-2 RNA  is generally detectable in upper and lower  respiratory specimens dur ing the acute phase of infection.  Positive  results are indicative of active infection with SARS-CoV-2.  Clinical  correlation with patient history and other diagnostic information is  necessary to determine patient infection status.  Positive results do  not rule out bacterial infection or co-infection with other viruses. If result is PRESUMPTIVE POSTIVE SARS-CoV-2 nucleic acids MAY BE PRESENT.   A presumptive positive result was obtained on the submitted specimen  and confirmed on repeat testing.  While 2019  novel coronavirus  (SARS-CoV-2) nucleic acids may be present in the submitted sample  additional confirmatory testing may be necessary for epidemiological  and / or clinical management purposes  to differentiate between  SARS-CoV-2 and other Sarbecovirus currently known to infect humans.  If clinically indicated additional testing with an alternate test  methodology [phone]) is advised. The SARS-CoV-2 RNA is generally  detectable in upper and lower respiratory sp ecimens during the acute  phase of infection. The expected result is Negative. Fact Sheet for Patients:  StrictlyIdeas.no Fact Sheet for Healthcare Providers: BankingDealers.co.za This test is not yet approved or cleared by the [place] FDA and has been authorized for detection and/or diagnosis of SARS-CoV-2 by FDA under an Emergency Use Authorization (EUA).  This EUA will remain in effect (meaning this test can be used) for the duration of the COVID-19 declaration under Section 564(b)(1) of the Act, 21 U.S.C. section 360bbb-3(b)(1), unless the authorization is terminated or revoked sooner. Performed at [facility], [place] [address], [place], [place] [address]   MRSA PCR Screening     Status: None   Collection Time: 09/28/18  1:27 AM   Specimen: Nasal Mucosa; Nasopharyngeal  Result Value Ref Range Status   MRSA by PCR NEGATIVE NEGATIVE Final    Comment:        The GeneXpert MRSA Assay (FDA approved for NASAL specimens only), is one component of a comprehensive MRSA colonization surveillance program. It is not intended to diagnose MRSA infection nor to guide or monitor treatment for MRSA infections. Performed at [facility], [place] [address] [place],  [address]          Radiology Studies: No results found.      Scheduled Meds: . atorvastatin  80 mg Oral q1800  . Chlorhexidine Gluconate Cloth  6 each Topical Q0600  . cinacalcet  30 mg  Oral Q breakfast  . clopidogrel  75 mg Oral Daily  . insulin aspart  0-5 Units Subcutaneous QHS  . insulin aspart  0-9 Units Subcutaneous TID WC  . metoprolol succinate  25 mg Oral Daily  . pantoprazole  40 mg Intravenous Q12H  . polyethylene glycol  17 g Oral BID  . sevelamer carbonate  1,600 mg Oral TID WC   Continuous Infusions:   LOS: 1 day    Time spent: 69min    Domenic Polite, MD [facility]  09/29/2018, 1:58 PM

## 2018-09-30 LAB — CBC
HCT: 20.6 % — ABNORMAL LOW (ref 39.0–52.0)
Hemoglobin: 6.7 g/dL — CL (ref 13.0–17.0)
MCH: 31.3 pg (ref 26.0–34.0)
MCHC: 32.5 g/dL (ref 30.0–36.0)
MCV: 96.3 fL (ref 80.0–100.0)
Platelets: 154 10*3/uL (ref 150–400)
RBC: 2.14 MIL/uL — ABNORMAL LOW (ref 4.22–5.81)
RDW: 15 % (ref 11.5–15.5)
WBC: 8.7 10*3/uL (ref 4.0–10.5)
nRBC: 0 % (ref 0.0–0.2)

## 2018-09-30 LAB — HEMOGLOBIN AND HEMATOCRIT, BLOOD
HCT: 24.1 % — ABNORMAL LOW (ref 39.0–52.0)
Hemoglobin: 8.1 g/dL — ABNORMAL LOW (ref 13.0–17.0)

## 2018-09-30 LAB — BASIC METABOLIC PANEL
Anion gap: 11 (ref 5–15)
BUN: 35 mg/dL — ABNORMAL HIGH (ref 8–23)
CO2: 27 mmol/L (ref 22–32)
Calcium: 7.7 mg/dL — ABNORMAL LOW (ref 8.9–10.3)
Chloride: 103 mmol/L (ref 98–111)
Creatinine, Ser: 5.68 mg/dL — ABNORMAL HIGH (ref 0.61–1.24)
GFR calc Af Amer: 10 mL/min — ABNORMAL LOW (ref >60)
GFR calc non Af Amer: 8 mL/min — ABNORMAL LOW (ref >60)
Glucose, Bld: 110 mg/dL — ABNORMAL HIGH (ref 70–99)
Potassium: 4.8 mmol/L (ref 3.5–5.1)
Sodium: 141 mmol/L (ref 135–145)

## 2018-09-30 LAB — GLUCOSE, CAPILLARY
Glucose-Capillary: 104 mg/dL — ABNORMAL HIGH (ref 70–99)
Glucose-Capillary: 130 mg/dL — ABNORMAL HIGH (ref 70–99)
Glucose-Capillary: 116 mg/dL — ABNORMAL HIGH (ref 70–99)
Glucose-Capillary: 188 mg/dL — ABNORMAL HIGH (ref 70–99)

## 2018-09-30 LAB — PREPARE RBC (CROSSMATCH)

## 2018-09-30 MED ORDER — SODIUM CHLORIDE 0.9% IV SOLUTION
Freq: Once | INTRAVENOUS | Status: AC
  Administered 2018-09-30: 09:00:00 via INTRAVENOUS

## 2018-09-30 NOTE — Progress Notes (Addendum)
  [facility] Progress Note   Subjective: Seen in room - feels ok, no CP/dyspnea. Getting blood transfusion currently. Per nursing notes, did have another maroon stool overnight.   Objective Vitals:   09/29/18 2037 09/30/18 0531 09/30/18 0815 09/30/18 0848  BP: (!) 102/54 (!) 102/51 (!) 99/48 (!) 106/50  Pulse: 79 79 85 88  Resp: 17 18 18 18   Temp: 98.7 F (37.1 C) 98.3 F (36.8 C) 98.6 F (37 C) 98.3 F (36.8 C)  TempSrc: Oral Oral  Oral  SpO2: 96% 95% 96% 97%  Weight: 72.3 kg     Height:       Physical Exam General: Well appearing man, NAD. Room air. Heart: RRR; no murmur Lungs: CTAB Abdomen: soft, non-tender Extremities: Trace LE edema Dialysis Access: L AVF + bruit  Additional Objective Labs: Basic Metabolic Panel: Recent Labs  Lab 09/28/18 0730 09/29/18 0523 09/30/18 0546  NA 138 138 141  K 5.1 5.4* 4.8  CL 100 98 103  CO2 28 25 27   GLUCOSE 93 87 110*  BUN 36* 52* 35*  CREATININE 5.32* 7.38* 5.68*  CALCIUM 8.0* 8.1* 7.7*   Liver Function Tests: Recent Labs  Lab 09/27/18 1907  AST 21  ALT 18  ALKPHOS 70  BILITOT 0.2*  PROT 6.1*  ALBUMIN 2.9*   CBC: Recent Labs  Lab 09/27/18 1907 09/28/18 0730 09/28/18 1759 09/29/18 0523 09/29/18 0744 09/30/18 0546  WBC 8.9 7.7 7.7 8.8 8.9 8.7  NEUTROABS 5.9  --   --   --   --   --   HGB 5.1* 7.9* 8.0* 7.6* 7.5* 6.7*  HCT 15.8* 23.2* 23.2* 22.8* 22.2* 20.6*  MCV 96.9 92.1 91.7 93.8 92.5 96.3  PLT 202 151 159 155 153 154   CBG: Recent Labs  Lab 09/28/18 2137 09/29/18 1200 09/29/18 1708 09/29/18 2037 09/30/18 0659  GLUCAP 88 80 142* 147* 104*   Iron Studies:  Recent Labs    09/28/18 0730  IRON 120  TIBC 234*  FERRITIN 887*   Studies/Results: No results found. Medications:  . atorvastatin  80 mg Oral q1800  . Chlorhexidine Gluconate Cloth  6 each Topical Q0600  . cinacalcet  30 mg Oral Q breakfast  . clopidogrel  75 mg Oral Daily  . insulin aspart  0-5 Units Subcutaneous  QHS  . insulin aspart  0-9 Units Subcutaneous TID WC  . metoprolol succinate  25 mg Oral Daily  . pantoprazole  40 mg Intravenous Q12H  . polyethylene glycol  17 g Oral BID  . sevelamer carbonate  1,600 mg Oral TID WC    Dialysis Orders: [facility] - called for records, not sent.  Assessment/Plan: 1. Symptomatic anemia/possible lower GIB: GI consulted. Conservative management for now. Follow HGB, transfusing today. 2.  CAD: Recent NSTEMI + stents, on plavix 3. ESRD: Continue HD per TTS schedule - next 9/8. 4. Hypertension/volume: BP controlled, edema improving. 5. Anemia: Hgb 5.1 on admit - s/p 3U PRBCs. Down to 6.7 this AM, getting another 1U currently.   6. Metabolic bone disease - Continue binders, have requested records from [place]. 7. Nutrition: Alb low, adding pro-stat 8. DM-per primary  Veneta Penton, PA-C 09/30/2018, 9:45 AM  [facility] Pager: [phone]  Pt seen, examined and agree w A/P as above.  Kelly Splinter  MD 09/30/2018, 4:19 PM

## 2018-09-30 NOTE — Plan of Care (Signed)
  Problem: Clinical Measurements: Goal: Diagnostic test results will improve Outcome: Progressing   

## 2018-09-30 NOTE — Plan of Care (Signed)
  Problem: Health Behavior/Discharge Planning: Goal: Ability to manage health-related needs will improve Outcome: Progressing   Problem: Clinical Measurements: Goal: Ability to maintain clinical measurements within normal limits will improve Outcome: Progressing Goal: Diagnostic test results will improve Outcome: Progressing   Problem: Elimination: Goal: Will not experience complications related to bowel motility Outcome: Progressing   Problem: Education: Goal: Knowledge of General Education information will improve Description: Including pain rating scale, medication(s)/side effects and non-pharmacologic comfort measures Outcome: Completed/Met   Problem: Clinical Measurements: Goal: Will remain free from infection Outcome: Completed/Met Goal: Respiratory complications will improve Outcome: Completed/Met Goal: Cardiovascular complication will be avoided Outcome: Completed/Met   Problem: Activity: Goal: Risk for activity intolerance will decrease Outcome: Completed/Met   Problem: Nutrition: Goal: Adequate nutrition will be maintained Outcome: Completed/Met   Problem: Elimination: Goal: Will not experience complications related to urinary retention Outcome: Completed/Met   Problem: Safety: Goal: Ability to remain free from injury will improve Outcome: Completed/Met   Problem: Skin Integrity: Goal: Risk for impaired skin integrity will decrease Outcome: Completed/Met

## 2018-09-30 NOTE — Evaluation (Signed)
Physical Therapy Evaluation Patient Details Name: Nicholas [name] MRN: [id number] DOB: 1930/09/11 Today's Date: 09/30/2018   History of Present Illness  85 year old male with history of ESRD on hemodialysis Tuesday Thursday Saturday, CAD, chronic systolic CHF, hypertension, dyslipidemia, recent STEMI, admitted with low Hgb attributed to lower GI Bleed  Clinical Impression   Pt admitted with above diagnosis. Typically walks with a cane, and reports no difficulty with ADLs; Presents to PT with gait and balance dysfunction; At this point, I recommend RW for bil UE support when walking (he is not very open to the idea of a RW);  Pt currently with functional limitations due to the deficits listed below (see PT Problem List). Pt will benefit from skilled PT to increase their independence and safety with mobility to allow discharge to the venue listed below.       Follow Up Recommendations Home health PT    Equipment Recommendations  Rolling walker with 5" wheels;3in1 (PT);Other (comment)(consider Rollator vs regular RW)    Recommendations for Other Services OT consult     Precautions / Restrictions Precautions Precautions: Fall Precaution Comments: Fall risk greatly reduced with use of RW      Mobility  Bed Mobility Overal bed mobility: Modified Independent             General bed mobility comments: incr tiem and used rails  Transfers Overall transfer level: Needs assistance Equipment used: 1 person hand held assist Transfers: Sit to/from Stand Sit to Stand: Mod assist         General transfer comment: light mod assist tosteady  Ambulation/Gait Ambulation/Gait assistance: Min assist Gait Distance (Feet): 80 Feet Assistive device: 1 person hand held assist;Rolling walker (2 wheeled)(and hallway rail) Gait Pattern/deviations: Step-through pattern;Decreased step length - right;Decreased step length - left;Decreased stride length     General Gait Details: Noted dependence on  UE support for balance  Stairs            Wheelchair Mobility    Modified Rankin (Stroke Patients Only)       Balance Overall balance assessment: Needs assistance   Sitting balance-Leahy Scale: Fair       Standing balance-Leahy Scale: Poor                               Pertinent Vitals/Pain Pain Assessment: No/denies pain    Home Living Family/patient expects to be discharged to:: Private residence Living Arrangements: Spouse/significant other;Children Available Help at Discharge: Family;Available PRN/intermittently Type of Home: House Home Access: Stairs to enter   [employer] of Steps: 1 Home Layout: Two level;Bed/bath upstairs        Prior Function Level of Independence: Independent with assistive device(s)         Comments: Uses a cane to walk and furniture walks in his home; tends to use a wheelchair when going to/from HD     Hand Dominance        Extremity/Trunk Assessment   Upper Extremity Assessment Upper Extremity Assessment: Generalized weakness    Lower Extremity Assessment Lower Extremity Assessment: Generalized weakness       Communication   Communication: No difficulties  Cognition Arousal/Alertness: Awake/alert Behavior During Therapy: WFL for tasks assessed/performed Overall Cognitive Status: Within Functional Limits for tasks assessed  General Comments      Exercises     Assessment/Plan    PT Assessment Patient needs continued PT services  PT Problem List Decreased strength;Decreased activity tolerance;Decreased balance;Decreased mobility;Decreased coordination;Decreased knowledge of use of DME;Decreased safety awareness;Decreased knowledge of precautions       PT Treatment Interventions DME instruction;Gait training;Stair training;Functional mobility training;Therapeutic activities;Therapeutic exercise;Balance training;Neuromuscular  re-education;Patient/family education    PT Goals (Current goals can be found in the Care Plan section)  Acute Rehab PT Goals Patient Stated Goal: hopes to be home soon PT Goal Formulation: With patient Time For Goal Achievement: 10/14/18 Potential to Achieve Goals: Good    Frequency Min 3X/week   Barriers to discharge        Co-evaluation               AM-PAC PT "6 Clicks" Mobility  Outcome Measure Help needed turning from your back to your side while in a flat bed without using bedrails?: None Help needed moving from lying on your back to sitting on the side of a flat bed without using bedrails?: None Help needed moving to and from a bed to a chair (including a wheelchair)?: A Little Help needed standing up from a chair using your arms (e.g., wheelchair or bedside chair)?: A Little Help needed to walk in hospital room?: A Little Help needed climbing 3-5 steps with a railing? : A Little 6 Click Score: 20    End of Session Equipment Utilized During Treatment: Gait belt Activity Tolerance: Patient tolerated treatment well Patient left: in bed;with call bell/phone within reach(sitting EOB in prep for lunch) Nurse Communication: Mobility status PT Visit Diagnosis: Unsteadiness on feet (R26.81);Other abnormalities of gait and mobility (R26.89)    Time: 3358-2518 PT Time Calculation (min) (ACUTE ONLY): 24 min   Charges:   PT Evaluation $PT Eval Moderate Complexity: 1 Mod PT Treatments $Gait Training: 8-22 mins        Roney Marion, PT  Acute Rehabilitation Services Pager [phone] Office [place] 09/30/2018, 3:52 PM

## 2018-09-30 NOTE — Progress Notes (Addendum)
PROGRESS NOTE    Nicholas [name]  IRW:[id number] DOB: 1930/07/31 DOA: 09/27/2018 PCP: Carol Ada, MD  Brief Narrative: 85 year old male with history of ESRD on hemodialysis Tuesday Thursday Saturday, CAD, chronic systolic CHF, hypertension, dyslipidemia. -He was admitted in July with the anterior non-STEMI, cath showed high-grade LAD, three-vessel disease with total occlusion of RCA, patient decided against bypass, subsequently underwent PCI with DES to proximal LAD, subsequently started on aspirin Plavix statin and metoprolol, echo showed EF of 35 to 40%, getting beyond 3 months of dual antiplatelet therapy was felt to be key if possible per Cardiologist Dr.Smith -Now admitted with lower GI bleed, hemoglobin of 5.1, gastroenterology following, cardiology notified, conservative management recommended, status post blood transfusion -Continues to have low-grade bleeding  Assessment & Plan:   Symptomatic anemia, suspected lower GI bleed -Long history of chronic anemia with iron deficiency and chronic disease/CKD -Now admitted with hemoglobin of 5.1, reported hematochezia, in the setting of dual antiplatelet therapy, -Last colonoscopy at [facility] in 2019 noted cecal AVM status post ablation [facility] gastroenterology consulted -Aspirin and Plavix were held on admission, I called and discussed with cardiology, Dr. Martinique 9/4, given recent LAD stent less than 2 months ago, started Plavix, hold aspirin and monitor clinical course - again with maroon stools and clots yesterday evening, hemoglobin down to 6.7, will transfuse another unit of PRBC -Gastroenterology following, continue PPI -Monitor hemoglobin closely  CAD/multivessel disease -admitted in July with the anterior non-STEMI, cath showed high-grade LAD, three-vessel disease with total occlusion of RCA, patient decided against bypass, after much discussion between cardiology and family, subsequently underwent PCI with DES to proximal  LAD on 09 August 2018, subsequently started on aspirin Plavix statin and metoprolol, echo showed EF of 35 to 40% -See above, discussed with cardiology Dr. Martinique 9/4 morning, high risk given recent stent less than 2 months ago, restarted Plavix, hold aspirin -Continue metoprolol and statin  Ischemic cardiomyopathy/chronic systolic CHF/EF 76% -Continue Toprol, volume managed with HD  ESRD on hemodialysis Tuesday Thursday Saturday -Patient was dialyzed yesterday, nephrology consulted, underwent dialysis today  Hyperkalemia -Resolved with HD  Type 2 diabetes mellitus -Last A1c was 6.8 in July, patient on insulin 70/30 as needed at home, CBG stable, continue sliding scale -Does not need insulin at this time  DVT prophylaxis: SCDs Code Status: Full code,  Family Communication: no family at bedside Disposition Plan: Home pending clinical stability, improvement  Consultants:   [facility] Dr. Cristina Gong  Discussed with cardiology Dr. Martinique   Procedures:   Antimicrobials:    Subjective: - maroon stool with large amount of clots around 7 PM yesterday per RN, no further bleeding, tolerating diet  Objective: Vitals:   09/30/18 0531 09/30/18 0815 09/30/18 0848 09/30/18 1113  BP: (!) 102/51 (!) 99/48 (!) 106/50 (!) 120/58  Pulse: 79 85 88 80  Resp: 18 18 18 18   Temp: 98.3 F (36.8 C) 98.6 F (37 C) 98.3 F (36.8 C) 98.5 F (36.9 C)  TempSrc: Oral  Oral Oral  SpO2: 95% 96% 97% 97%  Weight:      Height:        Intake/Output Summary (Last 24 hours) at 09/30/2018 1229 Last data filed at 09/30/2018 1111 Gross per 24 hour  Intake 1040 ml  Output 0 ml  Net 1040 ml   Filed Weights   09/29/18 0303 09/29/18 0700 09/29/18 2037  Weight: 72.5 kg 72.3 kg 72.3 kg    Examination:  Gen: AAO x3, chronically ill male, mild cognitive  deficits, no distress HEENT: PERRLA, Neck supple, no JVD Lungs: Few basilar rales CVS: RRR,No Gallops,Rubs or new Murmurs Abd: soft, Non tender, non  distended, BS present Extremities: Trace edema Skin: no new rashes Psychiatry: Poor insight and judgment    Data Reviewed:   CBC: Recent Labs  Lab 09/27/18 1907 09/28/18 0730 09/28/18 1759 09/29/18 0523 09/29/18 0744 09/30/18 0546  WBC 8.9 7.7 7.7 8.8 8.9 8.7  NEUTROABS 5.9  --   --   --   --   --   HGB 5.1* 7.9* 8.0* 7.6* 7.5* 6.7*  HCT 15.8* 23.2* 23.2* 22.8* 22.2* 20.6*  MCV 96.9 92.1 91.7 93.8 92.5 96.3  PLT 202 151 159 155 153 465   Basic Metabolic Panel: Recent Labs  Lab 09/27/18 1907 09/28/18 0730 09/29/18 0523 09/30/18 0546  NA 137 138 138 141  K 4.3 5.1 5.4* 4.8  CL 97* 100 98 103  CO2 25 28 25 27   GLUCOSE 105* 93 87 110*  BUN 28* 36* 52* 35*  CREATININE 4.42* 5.32* 7.38* 5.68*  CALCIUM 8.0* 8.0* 8.1* 7.7*   GFR: Estimated Creatinine Clearance: 9.2 mL/min (A) (by C-G formula based on SCr of 5.68 mg/dL (H)). Liver Function Tests: Recent Labs  Lab 09/27/18 1907  AST 21  ALT 18  ALKPHOS 70  BILITOT 0.2*  PROT 6.1*  ALBUMIN 2.9*   No results for input(s): LIPASE, AMYLASE in the last 168 hours. No results for input(s): AMMONIA in the last 168 hours. Coagulation Profile: Recent Labs  Lab 09/27/18 1907  INR 1.0   Cardiac Enzymes: No results for input(s): CKTOTAL, CKMB, CKMBINDEX, TROPONINI in the last 168 hours. BNP (last 3 results) No results for input(s): PROBNP in the last 8760 hours. HbA1C: No results for input(s): HGBA1C in the last 72 hours. CBG: Recent Labs  Lab 09/29/18 1200 09/29/18 1708 09/29/18 2037 09/30/18 0659 09/30/18 1143  GLUCAP 80 142* 147* 104* 130*   Lipid Profile: No results for input(s): CHOL, HDL, LDLCALC, TRIG, CHOLHDL, LDLDIRECT in the last 72 hours. Thyroid Function Tests: No results for input(s): TSH, T4TOTAL, FREET4, T3FREE, THYROIDAB in the last 72 hours. Anemia Panel: Recent Labs    09/28/18 0730  VITAMINB12 462  FOLATE 10.7  FERRITIN 887*  TIBC 234*  IRON 120  RETICCTPCT 2.1   Urine analysis:  No results found for: COLORURINE, APPEARANCEUR, LABSPEC, PHURINE, GLUCOSEU, HGBUR, BILIRUBINUR, KETONESUR, PROTEINUR, UROBILINOGEN, NITRITE, LEUKOCYTESUR Sepsis Labs: @LABRCNTIP (procalcitonin:4,lacticidven:4)  ) Recent Results (from the past 240 hour(s))  SARS Coronavirus 2 [facility] order, Performed in [facility] hospital lab) Nasopharyngeal Nasopharyngeal Swab     Status: None   Collection Time: 09/27/18  9:08 PM   Specimen: Nasopharyngeal Swab  Result Value Ref Range Status   SARS Coronavirus 2 NEGATIVE NEGATIVE Final    Comment: (NOTE) If result is NEGATIVE SARS-CoV-2 target nucleic acids are NOT DETECTED. The SARS-CoV-2 RNA is generally detectable in upper and lower  respiratory specimens during the acute phase of infection. The lowest  concentration of SARS-CoV-2 viral copies this assay can detect is 250  copies / mL. A negative result does not preclude SARS-CoV-2 infection  and should not be used as the sole basis for treatment or other  patient management decisions.  A negative result may occur with  improper specimen collection / handling, submission of specimen other  than nasopharyngeal swab, presence of viral mutation(s) within the  areas targeted by this assay, and inadequate number of viral copies  (<250 copies / mL). A negative  result must be combined with clinical  observations, patient history, and epidemiological information. If result is POSITIVE SARS-CoV-2 target nucleic acids are DETECTED. The SARS-CoV-2 RNA is generally detectable in upper and lower  respiratory specimens dur ing the acute phase of infection.  Positive  results are indicative of active infection with SARS-CoV-2.  Clinical  correlation with patient history and other diagnostic information is  necessary to determine patient infection status.  Positive results do  not rule out bacterial infection or co-infection with other viruses. If result is PRESUMPTIVE POSTIVE SARS-CoV-2 nucleic acids MAY  BE PRESENT.   A presumptive positive result was obtained on the submitted specimen  and confirmed on repeat testing.  While 2019 novel coronavirus  (SARS-CoV-2) nucleic acids may be present in the submitted sample  additional confirmatory testing may be necessary for epidemiological  and / or clinical management purposes  to differentiate between  SARS-CoV-2 and other Sarbecovirus currently known to infect humans.  If clinically indicated additional testing with an alternate test  methodology [phone]) is advised. The SARS-CoV-2 RNA is generally  detectable in upper and lower respiratory sp ecimens during the acute  phase of infection. The expected result is Negative. Fact Sheet for Patients:  StrictlyIdeas.no Fact Sheet for Healthcare Providers: BankingDealers.co.za This test is not yet approved or cleared by the [place] FDA and has been authorized for detection and/or diagnosis of SARS-CoV-2 by FDA under an Emergency Use Authorization (EUA).  This EUA will remain in effect (meaning this test can be used) for the duration of the COVID-19 declaration under Section 564(b)(1) of the Act, 21 U.S.C. section 360bbb-3(b)(1), unless the authorization is terminated or revoked sooner. Performed at [facility], [place] [address] [place], [place] [address]   MRSA PCR Screening     Status: None   Collection Time: 09/28/18  1:27 AM   Specimen: Nasal Mucosa; Nasopharyngeal  Result Value Ref Range Status   MRSA by PCR NEGATIVE NEGATIVE Final    Comment:        The GeneXpert MRSA Assay (FDA approved for NASAL specimens only), is one component of a comprehensive MRSA colonization surveillance program. It is not intended to diagnose MRSA infection nor to guide or monitor treatment for MRSA infections. Performed at [facility], [place] [address] [place], [place] [address]          Radiology Studies: No results found.       Scheduled Meds: . atorvastatin  80 mg Oral q1800  . Chlorhexidine Gluconate Cloth  6 each Topical Q0600  . cinacalcet  30 mg Oral Q breakfast  . clopidogrel  75 mg Oral Daily  . insulin aspart  0-5 Units Subcutaneous QHS  . insulin aspart  0-9 Units Subcutaneous TID WC  . metoprolol succinate  25 mg Oral Daily  . pantoprazole  40 mg Intravenous Q12H  . polyethylene glycol  17 g Oral BID  . sevelamer carbonate  1,600 mg Oral TID WC   Continuous Infusions:   LOS: 2 days    Time spent: 94min    Domenic Polite, MD [facility]  09/30/2018, 12:29 PM

## 2018-09-30 NOTE — Plan of Care (Signed)
  Problem: Health Behavior/Discharge Planning: Goal: Ability to manage health-related needs will improve Outcome: Progressing   Problem: Clinical Measurements: Goal: Ability to maintain clinical measurements within normal limits will improve Outcome: Progressing Goal: Diagnostic test results will improve Outcome: Progressing   Problem: Elimination: Goal: Will not experience complications related to bowel motility Outcome: Progressing

## 2018-09-30 NOTE — Progress Notes (Signed)
Patient reports several burgundy stools last night, exact quantity of blood hard to quantitate, not associated with dizziness, syncope, or hemodynamic instability, but with an associated fall in hemoglobin from 7.5 to 6.7, prompting transfusion of 1 unit of packed cells with an appropriate posttransfusion rise to 8.1 this afternoon.  He has not had any further stools since last night, thus going approximately 18 hours without hematochezia.  Discussion and plan:   This patient is clearly having an "on again, off again" pattern of bleeding which would be very compatible with diverticular bleeding (he has known diverticulosis), but it is recalled that he did have a bleeding vascular malformation cauterized in the cecal region last year.    Since he is recently status post MI, it would be ideal to avoid procedures requiring intravenous sedation, so although I am going to start gradually prepping the patient for possible colonoscopy with a clear liquid diet and MiraLAX 2 doses daily, while monitoring the trajectory of his hemoglobin level and the frequency of his bloody bowel movements (which might increase because of MiraLAX, even in the absence of further bleeding).  Unfortunately, being on dual agent antiplatelet therapy is working against [place] here, but we have little choice given his recent PCI with a DES.  Cleotis Nipper, M.D. Pager [phone] If no answer or after 5 PM call [phone]

## 2018-10-01 LAB — TYPE AND SCREEN
ABO/RH(D): A NEG
Antibody Screen: NEGATIVE
Unit division: 0
Unit division: 0
Unit division: 0
Unit division: 0

## 2018-10-01 LAB — BPAM RBC
Blood Product Expiration Date: 202009182359
Blood Product Expiration Date: 202009262359
Blood Product Expiration Date: 202009272359
Blood Product Expiration Date: 202009282359
ISSUE DATE / TIME: 202009032037
ISSUE DATE / TIME: 202009032230
ISSUE DATE / TIME: 202009040143
ISSUE DATE / TIME: 202009060823
Unit Type and Rh: 600
Unit Type and Rh: 600
Unit Type and Rh: 600
Unit Type and Rh: 600

## 2018-10-01 LAB — BASIC METABOLIC PANEL
Anion gap: 14 (ref 5–15)
BUN: 48 mg/dL — ABNORMAL HIGH (ref 8–23)
CO2: 24 mmol/L (ref 22–32)
Calcium: 7.6 mg/dL — ABNORMAL LOW (ref 8.9–10.3)
Chloride: 103 mmol/L (ref 98–111)
Creatinine, Ser: 7.41 mg/dL — ABNORMAL HIGH (ref 0.61–1.24)
GFR calc Af Amer: 7 mL/min — ABNORMAL LOW (ref >60)
GFR calc non Af Amer: 6 mL/min — ABNORMAL LOW (ref >60)
Glucose, Bld: 93 mg/dL (ref 70–99)
Potassium: 4.8 mmol/L (ref 3.5–5.1)
Sodium: 141 mmol/L (ref 135–145)

## 2018-10-01 LAB — CBC
HCT: 23.4 % — ABNORMAL LOW (ref 39.0–52.0)
Hemoglobin: 7.6 g/dL — ABNORMAL LOW (ref 13.0–17.0)
MCH: 31.3 pg (ref 26.0–34.0)
MCHC: 32.5 g/dL (ref 30.0–36.0)
MCV: 96.3 fL (ref 80.0–100.0)
Platelets: 164 10*3/uL (ref 150–400)
RBC: 2.43 MIL/uL — ABNORMAL LOW (ref 4.22–5.81)
RDW: 14.6 % (ref 11.5–15.5)
WBC: 8.3 10*3/uL (ref 4.0–10.5)
nRBC: 0 % (ref 0.0–0.2)

## 2018-10-01 LAB — GLUCOSE, CAPILLARY
Glucose-Capillary: 48 mg/dL — ABNORMAL LOW (ref 70–99)
Glucose-Capillary: 85 mg/dL (ref 70–99)
Glucose-Capillary: 77 mg/dL (ref 70–99)
Glucose-Capillary: 91 mg/dL (ref 70–99)

## 2018-10-01 MED ORDER — RENA-VITE PO TABS
1.0000 | ORAL_TABLET | Freq: Every day | ORAL | Status: DC
  Administered 2018-10-01 – 2018-10-03 (×3): 1 via ORAL
  Filled 2018-10-01 (×3): qty 1

## 2018-10-01 MED ORDER — SODIUM CHLORIDE 0.9 % IV SOLN: 510.0000 mg | Freq: Once | INTRAVENOUS | Status: DC

## 2018-10-01 MED ORDER — DARBEPOETIN ALFA 60 MCG/0.3ML IJ SOSY
60.0000 ug | PREFILLED_SYRINGE | INTRAMUSCULAR | Status: DC
  Filled 2018-10-01: qty 0.3

## 2018-10-01 NOTE — Progress Notes (Signed)
Patient has been without any further bowel movements since yesterday.    His hemoglobin has drifted down slightly, but even now, at 7.6, reflects an appropriate posttransfusion rise following 1 unit of blood for his hemoglobin of 6.7 yesterday.    Therefore, I think the drop in hemoglobin from the value yesterday afternoon reflects equilibration rather than further bleeding.  Recommend: Continue observation on clear liquid diet and gentle MiraLAX cleanout.  We will be prepared to do a full prep and proceed to colonoscopy if there is evidence of significant further bleeding.  Discussion: Since the patient appears to have stopped bleeding, and since his recent MI places him at greater risk for anesthesia and procedures, I think it is best to continue observation, and to do colonoscopy only if he shows definite evidence of ongoing or recurrent bleeding, in which case we would want to check the area of the cecum where, a year ago, he had an actively bleeding vascular lesion.    On balance, however, as previously stated I think that diverticular bleeding is more compatible with this patient's clinical course and is therefore a more likely explanation for his bleeding.  Cleotis Nipper, M.D. Pager [phone] If no answer or after 5 PM call [phone]

## 2018-10-01 NOTE — Progress Notes (Addendum)
PROGRESS NOTE    Nicholas [name]  DGU:[id number] DOB: 12-17-1930 DOA: 09/27/2018 PCP: Carol Ada, MD  Brief Narrative: 85 year old male with history of ESRD on hemodialysis Tuesday Thursday Saturday, CAD, chronic systolic CHF, hypertension, dyslipidemia. -He was admitted in July with the anterior non-STEMI, cath showed high-grade LAD, three-vessel disease with total occlusion of RCA, patient decided against bypass, subsequently underwent PCI with DES to proximal LAD, subsequently started on aspirin Plavix statin and metoprolol, echo showed EF of 35 to 40%, getting beyond 3 months of dual antiplatelet therapy was felt to be key if possible per Cardiologist Dr.Smith -Now admitted with lower GI bleed, hemoglobin of 5.1, gastroenterology following, cardiology notified, conservative management recommended, status post blood transfusion -Noted to have intermittent maroon stools with clots during hospitalization  Assessment & Plan:   Iron deficiency anemia, suspected lower GI bleed -Long history of chronic anemia with iron deficiency and chronic disease/CKD -Now admitted with hemoglobin of 5.1, reported hematochezia, in the setting of dual antiplatelet therapy, -Last colonoscopy at [facility] in 2019 noted cecal AVM status post ablation [facility] gastroenterology consulted -Aspirin and Plavix were held on admission, I called and discussed with cardiology, Dr. Martinique 9/4, given recent LAD stent less than 2 months ago, started Plavix, hold aspirin and monitor clinical course -Had recurrent maroon stools with clots during this admission, no further episodes yesterday, last transfusion was yesterday morning, appropriate improvement in hemoglobin to 7.6 today  -Gastroenterology following, continue PPI -Monitor in hospital another day if no further bleeding and hemoglobin stable will discharge him home tomorrow, give EPO today and IV iron x1, if bleeding recurs GI will consider colonoscopy   CAD/multivessel disease -admitted in July with the anterior non-STEMI, cath showed high-grade LAD, three-vessel disease with total occlusion of RCA, patient decided against bypass, after much discussion between cardiology and family, subsequently underwent PCI with DES to proximal LAD on 09 August 2018, subsequently started on aspirin Plavix statin and metoprolol, echo showed EF of 35 to 40% -See above, discussed with cardiology Dr. Martinique 9/4 morning, high risk given recent stent less than 2 months ago, restarted Plavix, hold aspirin -Continue metoprolol and statin  Ischemic cardiomyopathy/chronic systolic CHF/EF 95% -Continue Toprol, volume managed with HD  ESRD on hemodialysis Tuesday Thursday Saturday -Nephrology following for HD  Hyperkalemia -Resolved with HD  Type 2 diabetes mellitus -Last A1c was 6.8 in July, patient on insulin 70/30 as needed at home, CBG stable, continue sliding scale -Does not need insulin at this time  DVT prophylaxis: SCDs Code Status: Full code,  Family Communication: no family at bedside Disposition Plan: Home pending clinical stability, improvement, home tomorrow if hemoglobin stable, no further bleeding  Consultants:   [facility] Dr. Cristina Gong  Discussed with cardiology Dr. Martinique   Procedures:   Antimicrobials:    Subjective: -Denies any bleeding yesterday, no symptoms today feels okay  Objective: Vitals:   09/30/18 1809 09/30/18 2136 10/01/18 0535 10/01/18 0905  BP: 113/61 (!) 108/59 117/61 (!) 114/57  Pulse: 79 79 76 75  Resp: 18 18 17 18   Temp: 98.1 F (36.7 C) 98.2 F (36.8 C) 98.2 F (36.8 C) 98.3 F (36.8 C)  TempSrc: Oral  Oral Oral  SpO2: 99% 100% 94% 98%  Weight:  72.3 kg    Height:        Intake/Output Summary (Last 24 hours) at 10/01/2018 1533 Last data filed at 10/01/2018 0200 Gross per 24 hour  Intake 240 ml  Output 0 ml  Net 240 ml  Filed Weights   09/29/18 0700 09/29/18 2037 09/30/18 2136  Weight: 72.3 kg 72.3  kg 72.3 kg    Examination:  Gen: Awake, Alert, Oriented X 3, chronically ill-appearing pleasant, no distress HEENT: PERRLA, Neck supple, no JVD Lungs: Few basilar rales CVS: RRR,No Gallops,Rubs or new Murmurs Abd: soft, Non tender, non distended, BS present Extremities: No edema  skin: no new rashes Psychiatry: Poor insight and judgment    Data Reviewed:   CBC: Recent Labs  Lab 09/27/18 1907  09/28/18 1759 09/29/18 0523 09/29/18 0744 09/30/18 0546 09/30/18 1314 10/01/18 0440  WBC 8.9   < > 7.7 8.8 8.9 8.7  --  8.3  NEUTROABS 5.9  --   --   --   --   --   --   --   HGB 5.1*   < > 8.0* 7.6* 7.5* 6.7* 8.1* 7.6*  HCT 15.8*   < > 23.2* 22.8* 22.2* 20.6* 24.1* 23.4*  MCV 96.9   < > 91.7 93.8 92.5 96.3  --  96.3  PLT 202   < > 159 155 153 154  --  164   < > = values in this interval not displayed.   Basic Metabolic Panel: Recent Labs  Lab 09/27/18 1907 09/28/18 0730 09/29/18 0523 09/30/18 0546 10/01/18 0440  NA 137 138 138 141 141  K 4.3 5.1 5.4* 4.8 4.8  CL 97* 100 98 103 103  CO2 25 28 25 27 24   GLUCOSE 105* 93 87 110* 93  BUN 28* 36* 52* 35* 48*  CREATININE 4.42* 5.32* 7.38* 5.68* 7.41*  CALCIUM 8.0* 8.0* 8.1* 7.7* 7.6*   GFR: Estimated Creatinine Clearance: 7.1 mL/min (A) (by C-G formula based on SCr of 7.41 mg/dL (H)). Liver Function Tests: Recent Labs  Lab 09/27/18 1907  AST 21  ALT 18  ALKPHOS 70  BILITOT 0.2*  PROT 6.1*  ALBUMIN 2.9*   No results for input(s): LIPASE, AMYLASE in the last 168 hours. No results for input(s): AMMONIA in the last 168 hours. Coagulation Profile: Recent Labs  Lab 09/27/18 1907  INR 1.0   Cardiac Enzymes: No results for input(s): CKTOTAL, CKMB, CKMBINDEX, TROPONINI in the last 168 hours. BNP (last 3 results) No results for input(s): PROBNP in the last 8760 hours. HbA1C: No results for input(s): HGBA1C in the last 72 hours. CBG: Recent Labs  Lab 09/30/18 1143 09/30/18 1648 09/30/18 2135 10/01/18 1217  10/01/18 1303  GLUCAP 130* 116* 188* 48* 85   Lipid Profile: No results for input(s): CHOL, HDL, LDLCALC, TRIG, CHOLHDL, LDLDIRECT in the last 72 hours. Thyroid Function Tests: No results for input(s): TSH, T4TOTAL, FREET4, T3FREE, THYROIDAB in the last 72 hours. Anemia Panel: No results for input(s): VITAMINB12, FOLATE, FERRITIN, TIBC, IRON, RETICCTPCT in the last 72 hours. Urine analysis: No results found for: COLORURINE, APPEARANCEUR, LABSPEC, PHURINE, GLUCOSEU, HGBUR, BILIRUBINUR, KETONESUR, PROTEINUR, UROBILINOGEN, NITRITE, LEUKOCYTESUR Sepsis Labs: @LABRCNTIP (procalcitonin:4,lacticidven:4)  ) Recent Results (from the past 240 hour(s))  SARS Coronavirus 2 [facility] order, Performed in [facility] hospital lab) Nasopharyngeal Nasopharyngeal Swab     Status: None   Collection Time: 09/27/18  9:08 PM   Specimen: Nasopharyngeal Swab  Result Value Ref Range Status   SARS Coronavirus 2 NEGATIVE NEGATIVE Final    Comment: (NOTE) If result is NEGATIVE SARS-CoV-2 target nucleic acids are NOT DETECTED. The SARS-CoV-2 RNA is generally detectable in upper and lower  respiratory specimens during the acute phase of infection. The lowest  concentration of SARS-CoV-2 viral copies this  assay can detect is 250  copies / mL. A negative result does not preclude SARS-CoV-2 infection  and should not be used as the sole basis for treatment or other  patient management decisions.  A negative result may occur with  improper specimen collection / handling, submission of specimen other  than nasopharyngeal swab, presence of viral mutation(s) within the  areas targeted by this assay, and inadequate number of viral copies  (<250 copies / mL). A negative result must be combined with clinical  observations, patient history, and epidemiological information. If result is POSITIVE SARS-CoV-2 target nucleic acids are DETECTED. The SARS-CoV-2 RNA is generally detectable in upper and lower  respiratory  specimens dur ing the acute phase of infection.  Positive  results are indicative of active infection with SARS-CoV-2.  Clinical  correlation with patient history and other diagnostic information is  necessary to determine patient infection status.  Positive results do  not rule out bacterial infection or co-infection with other viruses. If result is PRESUMPTIVE POSTIVE SARS-CoV-2 nucleic acids MAY BE PRESENT.   A presumptive positive result was obtained on the submitted specimen  and confirmed on repeat testing.  While 2019 novel coronavirus  (SARS-CoV-2) nucleic acids may be present in the submitted sample  additional confirmatory testing may be necessary for epidemiological  and / or clinical management purposes  to differentiate between  SARS-CoV-2 and other Sarbecovirus currently known to infect humans.  If clinically indicated additional testing with an alternate test  methodology [phone]) is advised. The SARS-CoV-2 RNA is generally  detectable in upper and lower respiratory sp ecimens during the acute  phase of infection. The expected result is Negative. Fact Sheet for Patients:  StrictlyIdeas.no Fact Sheet for Healthcare Providers: BankingDealers.co.za This test is not yet approved or cleared by the [place] FDA and has been authorized for detection and/or diagnosis of SARS-CoV-2 by FDA under an Emergency Use Authorization (EUA).  This EUA will remain in effect (meaning this test can be used) for the duration of the COVID-19 declaration under Section 564(b)(1) of the Act, 21 U.S.C. section 360bbb-3(b)(1), unless the authorization is terminated or revoked sooner. Performed at [facility], [employer] [address] [place], [place] [address]   MRSA PCR Screening     Status: None   Collection Time: 09/28/18  1:27 AM   Specimen: Nasal Mucosa; Nasopharyngeal  Result Value Ref Range Status   MRSA by PCR NEGATIVE NEGATIVE Final     Comment:        The GeneXpert MRSA Assay (FDA approved for NASAL specimens only), is one component of a comprehensive MRSA colonization surveillance program. It is not intended to diagnose MRSA infection nor to guide or monitor treatment for MRSA infections. Performed at [facility], [place] [address], [place], [place] [address]          Radiology Studies: No results found.      Scheduled Meds: . atorvastatin  80 mg Oral q1800  . Chlorhexidine Gluconate Cloth  6 each Topical Q0600  . cinacalcet  30 mg Oral Q breakfast  . clopidogrel  75 mg Oral Daily  . [START ON 10/02/2018] darbepoetin (ARANESP) injection - DIALYSIS  60 mcg Intravenous Q Tue-HD  . insulin aspart  0-5 Units Subcutaneous QHS  . insulin aspart  0-9 Units Subcutaneous TID WC  . metoprolol succinate  25 mg Oral Daily  . multivitamin  1 tablet Oral QHS  . pantoprazole  40 mg Intravenous Q12H  . polyethylene glycol  17 g Oral BID  . sevelamer carbonate  1,600 mg Oral TID WC   Continuous Infusions:   LOS: 3 days    Time spent: 66min    Domenic Polite, MD [facility]  10/01/2018, 3:33 PM

## 2018-10-01 NOTE — Progress Notes (Signed)
Patient CBG was 48 gave juice and recheck was 85. Will continue to monitor.

## 2018-10-01 NOTE — Progress Notes (Signed)
[facility] Progress Note   Dialysis Orders: [facility] - called for records, not sent.  Assessment/Plan: 1. Symptomatic anemia/possible lower GIB: GI consulted. Conservative management for now. Transfused 4 units PRBC - the last unit was 9/6 hgb 7.6 hgb 5.1 upon admission - baseline ESA dose not known - give maintaince dose per EDW 2. CAD: Recent NSTEMI + stents, on plavix 3. ESRD: Continue HD per TTS schedule - next 9/8 - no heparin. 4. Hypertension/volume: BP controlled, edema improving. 5. Metabolic bone disease - Continue binders/sensipar, have requested records from [place]. 6. Nutrition: CL for now - added renal vitamin 7. DM-per primary  Nicholas Jacobson, PA-C [place] [phone] 10/01/2018,9:02 AM  LOS: 3 days   Subjective:   C/o being cold.  Appetite picking up - looking forward to oatmeal and got CL  Objective Vitals:   09/30/18 1113 09/30/18 1809 09/30/18 2136 10/01/18 0535  BP: (!) 120/58 113/61 (!) 108/59 117/61  Pulse: 80 79 79 76  Resp: 18 18 18 17   Temp: 98.5 F (36.9 C) 98.1 F (36.7 C) 98.2 F (36.8 C) 98.2 F (36.8 C)  TempSrc: Oral Oral  Oral  SpO2: 97% 99% 100% 94%  Weight:   72.3 kg   Height:       Physical Exam General: NAD alert and engaging Heart: RRR no murmur Lungs: no rales Abdomen: soft NT ND + BS Extremities: no sig LE edema Dialysis Access:  Left upper AVF + bruit   Additional Objective Labs: Basic Metabolic Panel: Recent Labs  Lab 09/29/18 0523 09/30/18 0546 10/01/18 0440  NA 138 141 141  K 5.4* 4.8 4.8  CL 98 103 103  CO2 25 27 24   GLUCOSE 87 110* 93  BUN 52* 35* 48*  CREATININE 7.38* 5.68* 7.41*  CALCIUM 8.1* 7.7* 7.6*   Liver Function Tests: Recent Labs  Lab 09/27/18 1907  AST 21  ALT 18  ALKPHOS 70  BILITOT 0.2*  PROT 6.1*  ALBUMIN 2.9*   No results for input(s): LIPASE, AMYLASE in the last 168 hours. CBC: Recent Labs  Lab 09/27/18 1907  09/28/18 1759  09/29/18 0523 09/29/18 0744 09/30/18 0546 09/30/18 1314 10/01/18 0440  WBC 8.9   < > 7.7 8.8 8.9 8.7  --  8.3  NEUTROABS 5.9  --   --   --   --   --   --   --   HGB 5.1*   < > 8.0* 7.6* 7.5* 6.7* 8.1* 7.6*  HCT 15.8*   < > 23.2* 22.8* 22.2* 20.6* 24.1* 23.4*  MCV 96.9   < > 91.7 93.8 92.5 96.3  --  96.3  PLT 202   < > 159 155 153 154  --  164   < > = values in this interval not displayed.   Blood Culture No results found for: SDES, SPECREQUEST, CULT, REPTSTATUS  Cardiac Enzymes: No results for input(s): CKTOTAL, CKMB, CKMBINDEX, TROPONINI in the last 168 hours. CBG: Recent Labs  Lab 09/29/18 2037 09/30/18 0659 09/30/18 1143 09/30/18 1648 09/30/18 2135  GLUCAP 147* 104* 130* 116* 188*   Iron Studies: No results for input(s): IRON, TIBC, TRANSFERRIN, FERRITIN in the last 72 hours. Lab Results  Component Value Date   INR 1.0 09/27/2018   INR 1.1 08/08/2018   Studies/Results: No results found. Medications:  . atorvastatin  80 mg Oral q1800  . Chlorhexidine Gluconate Cloth  6 each Topical Q0600  . cinacalcet  30 mg Oral Q  breakfast  . clopidogrel  75 mg Oral Daily  . insulin aspart  0-5 Units Subcutaneous QHS  . insulin aspart  0-9 Units Subcutaneous TID WC  . metoprolol succinate  25 mg Oral Daily  . pantoprazole  40 mg Intravenous Q12H  . polyethylene glycol  17 g Oral BID  . sevelamer carbonate  1,600 mg Oral TID WC

## 2018-10-01 NOTE — Care Management Important Message (Signed)
Important Message  Patient Details  Name: Nicholas [name] MRN: [id number] Date of Birth: Jul 14, 1930   Medicare Important Message Given:  Yes     Nicholas [name] 10/01/2018, 3:42 PM

## 2018-10-02 LAB — CBC
HCT: 21.5 % — ABNORMAL LOW (ref 39.0–52.0)
HCT: 22.9 % — ABNORMAL LOW (ref 39.0–52.0)
Hemoglobin: 7.1 g/dL — ABNORMAL LOW (ref 13.0–17.0)
Hemoglobin: 7.6 g/dL — ABNORMAL LOW (ref 13.0–17.0)
MCH: 31.8 pg (ref 26.0–34.0)
MCH: 31.3 pg (ref 26.0–34.0)
MCHC: 33 g/dL (ref 30.0–36.0)
MCHC: 33.2 g/dL (ref 30.0–36.0)
MCV: 96.4 fL (ref 80.0–100.0)
MCV: 94.2 fL (ref 80.0–100.0)
Platelets: 147 10*3/uL — ABNORMAL LOW (ref 150–400)
Platelets: 179 10*3/uL (ref 150–400)
RBC: 2.23 MIL/uL — ABNORMAL LOW (ref 4.22–5.81)
RBC: 2.43 MIL/uL — ABNORMAL LOW (ref 4.22–5.81)
RDW: 14.3 % (ref 11.5–15.5)
RDW: 14.1 % (ref 11.5–15.5)
WBC: 7.3 10*3/uL (ref 4.0–10.5)
WBC: 8.3 10*3/uL (ref 4.0–10.5)
nRBC: 0 % (ref 0.0–0.2)
nRBC: 0 % (ref 0.0–0.2)

## 2018-10-02 LAB — GLUCOSE, CAPILLARY
Glucose-Capillary: 72 mg/dL (ref 70–99)
Glucose-Capillary: 71 mg/dL (ref 70–99)
Glucose-Capillary: 84 mg/dL (ref 70–99)
Glucose-Capillary: 131 mg/dL — ABNORMAL HIGH (ref 70–99)
Glucose-Capillary: 118 mg/dL — ABNORMAL HIGH (ref 70–99)

## 2018-10-02 LAB — HEPATITIS B E ANTIBODY: Hep B E Ab: NEGATIVE

## 2018-10-02 NOTE — TOC Initial Note (Signed)
Transition of Care [facility]) - Initial/Assessment Note    Patient Details  Name: Nicholas [name] MRN: [id number] Date of Birth: Dec 14, 1930  Transition of Care [facility]) CM/SW Contact:    Bartholomew Crews, RN Phone Number: [phone] 10/02/2018, 12:03 PM  Clinical Narrative:                 Spoke with patient at the bedside. PTA home with spouse. PCP: Dr. Sherral Hammers at [facility]. Discussed [facility] PT needs - offered choice - patient agreeable to using his Medicare - referral accepted by [facility]. Will need HH order for PT with Face to Face. Discussed DME needs for rollator - [employer] notified. Will need DME order for rolling walker with seat.  Patient states that his wife will provide transportation home. No further transition of care needs identified at this time.   Expected Discharge Plan: [place] Barriers to Discharge: Continued Medical Work up   Patient Goals and CMS Choice Patient states their goals for this hospitalization and ongoing recovery are:: return home CMS Medicare.gov Compare Post Acute Care list provided to:: Patient Choice offered to / list presented to : Patient  Expected Discharge Plan and Services Expected Discharge Plan: [place]   Discharge Planning Services: CM Consult Post Acute Care Choice: Durable Medical Equipment, Home Health Living arrangements for the past 2 months: Single Family Home                 DME Arranged: 3-N-1, Walker rolling with seat DME Agency: [employer] Date DME Agency Contacted: 10/02/18 Time DME Agency Contacted: 1202 Representative spoke with at DME Agency: [place]: PT [place]: [facility] Date [place]: 10/02/18 Time [place]: 1202 Representative spoke with at [place]: Dorian Pod  Prior Living Arrangements/Services Living arrangements for the past 2 months: [place] with:: Self, Spouse Patient language and need for interpreter reviewed:: Yes Do you feel safe going back to the place where you live?: Yes      Need for Family Participation in Patient Care: Yes (Comment) Care giver support system in place?: Yes (comment)   Criminal Activity/Legal Involvement Pertinent to Current Situation/Hospitalization: No - Comment as needed  Activities of Daily Living Home Assistive Devices/Equipment: Eyeglasses, Dentures (specify type) ADL Screening (condition at time of admission) Patient's cognitive ability adequate to safely complete daily activities?: Yes Is the patient deaf or have difficulty hearing?: No Does the patient have difficulty seeing, even when wearing glasses/contacts?: No Does the patient have difficulty concentrating, remembering, or making decisions?: No Patient able to express need for assistance with ADLs?: Yes Does the patient have difficulty dressing or bathing?: No Independently performs ADLs?: Yes (appropriate for developmental age) Does the patient have difficulty walking or climbing stairs?: No Weakness of Legs: None Weakness of Arms/Hands: None  Permission Sought/Granted                  Emotional Assessment Appearance:: Appears stated age Attitude/Demeanor/Rapport: Engaged Affect (typically observed): Accepting Orientation: : Oriented to Self, Oriented to Place, Oriented to  Time, Oriented to Situation Alcohol / Substance Use: Not Applicable Psych Involvement: No (comment)  Admission diagnosis:  Lower GI bleed [K92.2] ESRD (end stage renal disease) on dialysis ([place]) [N18.6, Z99.2] Symptomatic anemia [D64.9] Patient Active Problem List   Diagnosis Date Noted  . Symptomatic anemia 09/28/2018  . GIB (gastrointestinal bleeding)  09/27/2018  . Anemia due to blood loss 09/27/2018  . Type II diabetes mellitus with renal manifestations ([place]) 09/27/2018  . GERD (gastroesophageal reflux disease) 09/27/2018  .  CAD (coronary artery disease) 09/27/2018  . Chronic systolic CHF (congestive heart failure) ([place]) 09/27/2018  . NSTEMI (non-ST elevated myocardial infarction) ([place]) 08/06/2018  . Diabetes mellitus ([place])   . Hypertension   . Hypercholesteremia   . ESRD (end stage renal disease) on dialysis ([place])   . Anemia of chronic disease   . Lung nodule seen on imaging study 04/30/2012   PCP:  Carol Ada, MD Pharmacy:   [facility], [place] [address] Phone: [phone] Fax: [phone]     Social Determinants of Health (SDOH) Interventions    Readmission Risk Interventions No flowsheet data found.

## 2018-10-02 NOTE — Progress Notes (Signed)
[facility] Progress Note   [facility]: [facility] - no records rec to date  Assessment/Plan: 1. Symptomatic anemia / lower GIB: GI consulted. Conservative management for now. Transfused 4 units PRBC - rec ESA maintenance dose per EDW 2. CAD: Recent NSTEMI + stents, on plavix 3. ESRD: Continue HD per TTS schedule - next 9/8 - no heparin. 4. Hypertension/volume: BP controlled, edema improving. 5. Metabolic bone disease - Continue binders/sensipar, have requested records from [place]. 6. Nutrition: CL for now - added renal vitamin 7. DM-per primary  [facility] 10/02/2018,12:52 PM  LOS: 4 days   Subjective:   Passing some maroon stool overnight Objective Vitals:   10/01/18 1500 10/01/18 2032 10/02/18 0349 10/02/18 0807  BP: (!) 110/58 (!) 107/58 (!) 107/56 122/67  Pulse: 79 73 72 78  Resp: 18 16 16 18   Temp: 98.2 F (36.8 C) 97.7 F (36.5 C) 98.3 F (36.8 C) 97.7 F (36.5 C)  TempSrc: Oral Oral Oral Oral  SpO2: 100% 100% 94% 98%  Weight:  72.4 kg    Height:       Physical Exam General: NAD alert and engaging Heart: RRR no murmur Lungs: no rales Abdomen: soft NT ND + BS Extremities: no sig LE edema Dialysis Access:  Left upper AVF + bruit   Additional Objective Labs: Basic Metabolic Panel: Recent Labs  Lab 09/29/18 0523 09/30/18 0546 10/01/18 0440  NA 138 141 141  K 5.4* 4.8 4.8  CL 98 103 103  CO2 25 27 24   GLUCOSE 87 110* 93  BUN 52* 35* 48*  CREATININE 7.38* 5.68* 7.41*  CALCIUM 8.1* 7.7* 7.6*   Liver Function Tests: Recent Labs  Lab 09/27/18 1907  AST 21  ALT 18  ALKPHOS 70  BILITOT 0.2*  PROT 6.1*  ALBUMIN 2.9*   No results for input(s): LIPASE, AMYLASE in the last 168 hours. CBC: Recent Labs  Lab 09/27/18 1907  09/29/18 0523 09/29/18 0744 09/30/18 0546 09/30/18 1314 10/01/18 0440 10/02/18 0526  WBC 8.9   < > 8.8 8.9 8.7  --  8.3 7.3  NEUTROABS 5.9  --   --   --   --    --   --   --   HGB 5.1*   < > 7.6* 7.5* 6.7* 8.1* 7.6* 7.1*  HCT 15.8*   < > 22.8* 22.2* 20.6* 24.1* 23.4* 21.5*  MCV 96.9   < > 93.8 92.5 96.3  --  96.3 96.4  PLT 202   < > 155 153 154  --  164 147*   < > = values in this interval not displayed.   Blood Culture No results found for: SDES, SPECREQUEST, CULT, REPTSTATUS  Cardiac Enzymes: No results for input(s): CKTOTAL, CKMB, CKMBINDEX, TROPONINI in the last 168 hours. CBG: Recent Labs  Lab 10/01/18 1621 10/01/18 2109 10/02/18 0351 10/02/18 0701 10/02/18 1153  GLUCAP 77 91 72 71 84   Iron Studies: No results for input(s): IRON, TIBC, TRANSFERRIN, FERRITIN in the last 72 hours. Lab Results  Component Value Date   INR 1.0 09/27/2018   INR 1.1 08/08/2018   Studies/Results: No results found. Medications:  . atorvastatin  80 mg Oral q1800  . Chlorhexidine Gluconate Cloth  6 each Topical Q0600  . cinacalcet  30 mg Oral Q breakfast  . clopidogrel  75 mg Oral Daily  . darbepoetin (ARANESP) injection - DIALYSIS  60 mcg Intravenous Q Tue-HD  . insulin aspart  0-5  Units Subcutaneous QHS  . insulin aspart  0-9 Units Subcutaneous TID WC  . metoprolol succinate  25 mg Oral Daily  . multivitamin  1 tablet Oral QHS  . pantoprazole  40 mg Intravenous Q12H  . polyethylene glycol  17 g Oral BID  . sevelamer carbonate  1,600 mg Oral TID WC

## 2018-10-02 NOTE — Progress Notes (Addendum)
Drop in hemoglobin level over past 24 hours from 7.6 to current level of 7.1 is somewhat concerning, BUT pt feels the same as yesterday (no dizziness) and has only had 1 BM (semi-formed, dark) in past 24hrs.  IMPR:  Doubt further bleeding, altho cannot exclude slow bleed.  PLAN:  1. Advance diet 2. Monitor bld count 3. Continue Miralax 4. Given recent MI and pt's advanced age, I think default position should be to NOT do colonoscopy unless our hand is forced by persistent or significant recurrent bleeding, since the most likely explanation (diverticular) would not be helped by colonoscopy--but we have to keep in mind that 1 yr ago in [facility] he did have a bleeding cecal AVM.  Cleotis Nipper, M.D. Pager [phone] If no answer or after 5 PM call [phone]  ADDENDUM 11:44 a.m.:   Pt just had a liquid BM (as expected on Miralax), brown fluid w/ very slight reddish tinge (did have cranberry juice this a.m.); no frank blood, no clots.  Pt has been walking in the hall today w/out dizziness.  Chest clr, heart nl, skin warm, radial pulse nl.  UPDATED IMPRESSION:  I am even more convinced pt is not currently bleeding.  Have advised pt to avoid to vegetable food on renal diet in case we do end up needing to do a colonoscopy.  Will decrease Miralax to once a day to try to avoid excessive diarr.  Cleotis Nipper, M.D. Pager [phone] If no answer or after 5 PM call [phone]

## 2018-10-02 NOTE — Plan of Care (Signed)
  Problem: Elimination: Goal: Will not experience complications related to bowel motility Outcome: Progressing   

## 2018-10-02 NOTE — Progress Notes (Signed)
PROGRESS NOTE    Nicholas [name]  MEQ:[id number] DOB: Nov 11, 1930 DOA: 09/27/2018 PCP: Carol Ada, MD  Brief Narrative: 85 year old male with history of ESRD on hemodialysis Tuesday Thursday Saturday, CAD, chronic systolic CHF, hypertension, dyslipidemia. -He was admitted in July with the anterior non-STEMI, cath showed high-grade LAD, three-vessel disease with total occlusion of RCA, patient decided against bypass, subsequently underwent PCI with DES to proximal LAD, subsequently started on aspirin Plavix statin and metoprolol, echo showed EF of 35 to 40%, getting beyond 3 months of dual antiplatelet therapy was felt to be key if possible per Cardiologist Dr.Smith -Now admitted with lower GI bleed, hemoglobin of 5.1, gastroenterology following, cardiology notified, conservative management recommended, status post blood transfusion -Noted to have intermittent maroon stools with clots during hospitalization  Assessment & Plan:   Iron deficiency anemia, suspected lower GI bleed -Long history of chronic anemia with iron deficiency and chronic disease/CKD -Now admitted with hemoglobin of 5.1, reported hematochezia, in the setting of dual antiplatelet therapy, -Last colonoscopy at [facility] in 2019 noted cecal AVM status post ablation [facility] gastroenterology consulted -Aspirin and Plavix were held on admission, I called and discussed with cardiology, Dr. Martinique 9/4, given recent LAD stent less than 2 months ago, started Plavix, hold aspirin and monitor clinical course -Status post 4 units of PRBC thus far -Had recurrent maroon stools with clots during this admission, , last episode this morning 9/8, hemoglobin trending down to 7.1 this morning -Gastroenterology following, continue PPI -Given IV iron and EPO as well, may be forced to consider endoscopic evaluation given persistence of bleeding   CAD/multivessel disease -admitted in July with the anterior non-STEMI, cath showed high-grade  LAD, three-vessel disease with total occlusion of RCA, patient decided against bypass, after much discussion between cardiology and family, subsequently underwent PCI with DES to proximal LAD on 09 August 2018, subsequently started on aspirin Plavix statin and metoprolol, echo showed EF of 35 to 40% -See above, discussed with cardiology Dr. Martinique on 9/4, high risk given recent stent less than 2 months ago, restarted Plavix, hold aspirin -Continue metoprolol and statin  Ischemic cardiomyopathy/chronic systolic CHF/EF 29% -Continue Toprol, volume managed with HD  ESRD on hemodialysis Tuesday Thursday Saturday -Nephrology following for HD  Hyperkalemia -Resolved with HD  Type 2 diabetes mellitus -Last A1c was 6.8 in July, patient on insulin 70/30 as needed at home, CBG stable, continue sliding scale -Does not need insulin at this time  DVT prophylaxis: SCDs Code Status: Full code,  Family Communication: no family at bedside Disposition Plan: To be determined, pending resolution of bleeding  Consultants:   [facility] Dr. Cristina Gong  Discussed with cardiology Dr. Martinique   Procedures:   Antimicrobials:    Subjective: -Feels okay, had maroon stool with clots again this morning  Objective: Vitals:   10/01/18 1500 10/01/18 2032 10/02/18 0349 10/02/18 0807  BP: (!) 110/58 (!) 107/58 (!) 107/56 122/67  Pulse: 79 73 72 78  Resp: 18 16 16 18   Temp: 98.2 F (36.8 C) 97.7 F (36.5 C) 98.3 F (36.8 C) 97.7 F (36.5 C)  TempSrc: Oral Oral Oral Oral  SpO2: 100% 100% 94% 98%  Weight:  72.4 kg    Height:        Intake/Output Summary (Last 24 hours) at 10/02/2018 1132 Last data filed at 10/02/2018 0817 Gross per 24 hour  Intake 900 ml  Output 0 ml  Net 900 ml   Filed Weights   09/29/18 2037 09/30/18 2136 10/01/18 2032  Weight: 72.3 kg 72.3 kg 72.4 kg    Examination:  Gen: Awake, Alert, Oriented X 3, elderly pleasant male, no distress HEENT: PERRLA, Neck supple, no JVD Lungs:  Clear bilaterally CVS: RRR,No Gallops,Rubs or new Murmurs Abd: soft, Non tender, non distended, BS present Extremities: No edema Skin: no new rashes Psychiatry: Poor insight and judgment    Data Reviewed:   CBC: Recent Labs  Lab 09/27/18 1907  09/29/18 0523 09/29/18 0744 09/30/18 0546 09/30/18 1314 10/01/18 0440 10/02/18 0526  WBC 8.9   < > 8.8 8.9 8.7  --  8.3 7.3  NEUTROABS 5.9  --   --   --   --   --   --   --   HGB 5.1*   < > 7.6* 7.5* 6.7* 8.1* 7.6* 7.1*  HCT 15.8*   < > 22.8* 22.2* 20.6* 24.1* 23.4* 21.5*  MCV 96.9   < > 93.8 92.5 96.3  --  96.3 96.4  PLT 202   < > 155 153 154  --  164 147*   < > = values in this interval not displayed.   Basic Metabolic Panel: Recent Labs  Lab 09/27/18 1907 09/28/18 0730 09/29/18 0523 09/30/18 0546 10/01/18 0440  NA 137 138 138 141 141  K 4.3 5.1 5.4* 4.8 4.8  CL 97* 100 98 103 103  CO2 25 28 25 27 24   GLUCOSE 105* 93 87 110* 93  BUN 28* 36* 52* 35* 48*  CREATININE 4.42* 5.32* 7.38* 5.68* 7.41*  CALCIUM 8.0* 8.0* 8.1* 7.7* 7.6*   GFR: Estimated Creatinine Clearance: 7.1 mL/min (A) (by C-G formula based on SCr of 7.41 mg/dL (H)). Liver Function Tests: Recent Labs  Lab 09/27/18 1907  AST 21  ALT 18  ALKPHOS 70  BILITOT 0.2*  PROT 6.1*  ALBUMIN 2.9*   No results for input(s): LIPASE, AMYLASE in the last 168 hours. No results for input(s): AMMONIA in the last 168 hours. Coagulation Profile: Recent Labs  Lab 09/27/18 1907  INR 1.0   Cardiac Enzymes: No results for input(s): CKTOTAL, CKMB, CKMBINDEX, TROPONINI in the last 168 hours. BNP (last 3 results) No results for input(s): PROBNP in the last 8760 hours. HbA1C: No results for input(s): HGBA1C in the last 72 hours. CBG: Recent Labs  Lab 10/01/18 1303 10/01/18 1621 10/01/18 2109 10/02/18 0351 10/02/18 0701  GLUCAP 85 77 91 72 71   Lipid Profile: No results for input(s): CHOL, HDL, LDLCALC, TRIG, CHOLHDL, LDLDIRECT in the last 72 hours. Thyroid  Function Tests: No results for input(s): TSH, T4TOTAL, FREET4, T3FREE, THYROIDAB in the last 72 hours. Anemia Panel: No results for input(s): VITAMINB12, FOLATE, FERRITIN, TIBC, IRON, RETICCTPCT in the last 72 hours. Urine analysis: No results found for: COLORURINE, APPEARANCEUR, LABSPEC, PHURINE, GLUCOSEU, HGBUR, BILIRUBINUR, KETONESUR, PROTEINUR, UROBILINOGEN, NITRITE, LEUKOCYTESUR Sepsis Labs: @LABRCNTIP (procalcitonin:4,lacticidven:4)  ) Recent Results (from the past 240 hour(s))  SARS Coronavirus 2 [facility] order, Performed in [facility] hospital lab) Nasopharyngeal Nasopharyngeal Swab     Status: None   Collection Time: 09/27/18  9:08 PM   Specimen: Nasopharyngeal Swab  Result Value Ref Range Status   SARS Coronavirus 2 NEGATIVE NEGATIVE Final    Comment: (NOTE) If result is NEGATIVE SARS-CoV-2 target nucleic acids are NOT DETECTED. The SARS-CoV-2 RNA is generally detectable in upper and lower  respiratory specimens during the acute phase of infection. The lowest  concentration of SARS-CoV-2 viral copies this assay can detect is 250  copies / mL. A negative result does  not preclude SARS-CoV-2 infection  and should not be used as the sole basis for treatment or other  patient management decisions.  A negative result may occur with  improper specimen collection / handling, submission of specimen other  than nasopharyngeal swab, presence of viral mutation(s) within the  areas targeted by this assay, and inadequate number of viral copies  (<250 copies / mL). A negative result must be combined with clinical  observations, patient history, and epidemiological information. If result is POSITIVE SARS-CoV-2 target nucleic acids are DETECTED. The SARS-CoV-2 RNA is generally detectable in upper and lower  respiratory specimens dur ing the acute phase of infection.  Positive  results are indicative of active infection with SARS-CoV-2.  Clinical  correlation with patient history and  other diagnostic information is  necessary to determine patient infection status.  Positive results do  not rule out bacterial infection or co-infection with other viruses. If result is PRESUMPTIVE POSTIVE SARS-CoV-2 nucleic acids MAY BE PRESENT.   A presumptive positive result was obtained on the submitted specimen  and confirmed on repeat testing.  While 2019 novel coronavirus  (SARS-CoV-2) nucleic acids may be present in the submitted sample  additional confirmatory testing may be necessary for epidemiological  and / or clinical management purposes  to differentiate between  SARS-CoV-2 and other Sarbecovirus currently known to infect humans.  If clinically indicated additional testing with an alternate test  methodology [phone]) is advised. The SARS-CoV-2 RNA is generally  detectable in upper and lower respiratory sp ecimens during the acute  phase of infection. The expected result is Negative. Fact Sheet for Patients:  StrictlyIdeas.no Fact Sheet for Healthcare Providers: BankingDealers.co.za This test is not yet approved or cleared by the [place] FDA and has been authorized for detection and/or diagnosis of SARS-CoV-2 by FDA under an Emergency Use Authorization (EUA).  This EUA will remain in effect (meaning this test can be used) for the duration of the COVID-19 declaration under Section 564(b)(1) of the Act, 21 U.S.C. section 360bbb-3(b)(1), unless the authorization is terminated or revoked sooner. Performed at [facility], [place] [address], [place], [place] [address]   MRSA PCR Screening     Status: None   Collection Time: 09/28/18  1:27 AM   Specimen: Nasal Mucosa; Nasopharyngeal  Result Value Ref Range Status   MRSA by PCR NEGATIVE NEGATIVE Final    Comment:        The GeneXpert MRSA Assay (FDA approved for NASAL specimens only), is one component of a comprehensive MRSA colonization surveillance program. It is  not intended to diagnose MRSA infection nor to guide or monitor treatment for MRSA infections. Performed at [facility], [place] [address], [place],  [address]          Radiology Studies: No results found.      Scheduled Meds: . atorvastatin  80 mg Oral q1800  . Chlorhexidine Gluconate Cloth  6 each Topical Q0600  . cinacalcet  30 mg Oral Q breakfast  . clopidogrel  75 mg Oral Daily  . darbepoetin (ARANESP) injection - DIALYSIS  60 mcg Intravenous Q Tue-HD  . insulin aspart  0-5 Units Subcutaneous QHS  . insulin aspart  0-9 Units Subcutaneous TID WC  . metoprolol succinate  25 mg Oral Daily  . multivitamin  1 tablet Oral QHS  . pantoprazole  40 mg Intravenous Q12H  . polyethylene glycol  17 g Oral BID  . sevelamer carbonate  1,600 mg Oral TID WC  Continuous Infusions:   LOS: 4 days    Time spent: 85min    Domenic Polite, MD [facility]  10/02/2018, 11:32 AM

## 2018-10-02 NOTE — Progress Notes (Signed)
Physical Therapy Treatment Patient Details Name: Nicholas [name] MRN: [id number] DOB: [birth year] Today's Date: 10/02/2018    History of Present Illness 85 year old male with history of ESRD on hemodialysis Tuesday Thursday Saturday, CAD, chronic systolic CHF, hypertension, dyslipidemia, recent STEMI, admitted with low Hgb attributed to lower GI Bleed    PT Comments    Continuing work on functional mobility and activity tolerance;  Took the Rollator RW for a "test drive" today, and it was a success; Nicholas [name] seemed to like it, and tells me he will use it at home as needed -- he very much prefers it to the regular 2 wheeled RW, and I liek the bilateral UE support it gives over the cane he typically uses  Follow Up Recommendations  Home health PT     Equipment Recommendations  3in1 (PT);Other (comment)(Rollator RW)    Recommendations for Other Services       Precautions / Restrictions Precautions Precautions: Fall Precaution Comments: Fall risk greatly reduced with use of Rollator RW    Mobility  Bed Mobility               General bed mobility comments: EOB upon arrival  Transfers Overall transfer level: Needs assistance Equipment used: 4-wheeled walker Transfers: Sit to/from Stand Sit to Stand: Min guard         General transfer comment:  little extra time to figure out most comfortable place for hands; cues to push up from bed with at least one hand; Stood from [employer] seat slowly, and with hands on handles  Ambulation/Gait Ambulation/Gait assistance: Min guard Gait Distance (Feet): 120 Feet(with one seated rest break on Rollator seat) Assistive device: 4-wheeled walker Gait Pattern/deviations: Step-through pattern;Decreased step length - right;Decreased step length - left;Decreased stride length     General Gait Details: Good use of Rollator RW for balance; He seemed to like the Field seismologist    Modified Rankin  (Stroke Patients Only)       Balance Overall balance assessment: Needs assistance   Sitting balance-Leahy Scale: Fair       Standing balance-Leahy Scale: Poor(approaching Fair)                              Cognition Arousal/Alertness: Awake/alert Behavior During Therapy: WFL for tasks assessed/performed Overall Cognitive Status: Within Functional Limits for tasks assessed                                        Exercises      General Comments        Pertinent Vitals/Pain Pain Assessment: No/denies pain    Home Living                      Prior Function            PT Goals (current goals can now be found in the care plan section) Acute Rehab PT Goals Patient Stated Goal: hopes to be home soon PT Goal Formulation: With patient Time For Goal Achievement: 10/14/18 Potential to Achieve Goals: Good Progress towards PT goals: Progressing toward goals    Frequency    Min 3X/week      PT Plan Current plan remains appropriate;Equipment recommendations need to be updated    Co-evaluation  AM-PAC PT "6 Clicks" Mobility   Outcome Measure  Help needed turning from your back to your side while in a flat bed without using bedrails?: None Help needed moving from lying on your back to sitting on the side of a flat bed without using bedrails?: None Help needed moving to and from a bed to a chair (including a wheelchair)?: A Little Help needed standing up from a chair using your arms (e.g., wheelchair or bedside chair)?: A Little Help needed to walk in hospital room?: A Little Help needed climbing 3-5 steps with a railing? : A Little 6 Click Score: 20    End of Session Equipment Utilized During Treatment: Gait belt Activity Tolerance: Patient tolerated treatment well Patient left: in bed;with call bell/phone within reach(sitting EOB) Nurse Communication: Mobility status PT Visit Diagnosis: Unsteadiness on feet  (R26.81);Other abnormalities of gait and mobility (R26.89)     Time: 5974-7185 PT Time Calculation (min) (ACUTE ONLY): 19 min  Charges:  $Gait Training: 8-22 mins                     Roney Marion, [place]  [place] Pager [phone] Office [place] 10/02/2018, 10:13 AM

## 2018-10-03 DIAGNOSIS — K922 Gastrointestinal hemorrhage, unspecified: Secondary | ICD-10-CM

## 2018-10-03 LAB — BASIC METABOLIC PANEL
Anion gap: 15 (ref 5–15)
BUN: 71 mg/dL — ABNORMAL HIGH (ref 8–23)
CO2: 20 mmol/L — ABNORMAL LOW (ref 22–32)
Calcium: 7.4 mg/dL — ABNORMAL LOW (ref 8.9–10.3)
Chloride: 100 mmol/L (ref 98–111)
Creatinine, Ser: 10.66 mg/dL — ABNORMAL HIGH (ref 0.61–1.24)
GFR calc Af Amer: 5 mL/min — ABNORMAL LOW (ref >60)
GFR calc non Af Amer: 4 mL/min — ABNORMAL LOW (ref >60)
Glucose, Bld: 88 mg/dL (ref 70–99)
Potassium: 5.2 mmol/L — ABNORMAL HIGH (ref 3.5–5.1)
Sodium: 135 mmol/L (ref 135–145)

## 2018-10-03 LAB — HEMOGLOBIN AND HEMATOCRIT, BLOOD
HCT: 22.8 % — ABNORMAL LOW (ref 39.0–52.0)
Hemoglobin: 7.6 g/dL — ABNORMAL LOW (ref 13.0–17.0)

## 2018-10-03 LAB — GLUCOSE, CAPILLARY
Glucose-Capillary: 89 mg/dL (ref 70–99)
Glucose-Capillary: 87 mg/dL (ref 70–99)
Glucose-Capillary: 152 mg/dL — ABNORMAL HIGH (ref 70–99)
Glucose-Capillary: 106 mg/dL — ABNORMAL HIGH (ref 70–99)

## 2018-10-03 LAB — CBC
HCT: 20.7 % — ABNORMAL LOW (ref 39.0–52.0)
Hemoglobin: 7.2 g/dL — ABNORMAL LOW (ref 13.0–17.0)
MCH: 32.6 pg (ref 26.0–34.0)
MCHC: 34.8 g/dL (ref 30.0–36.0)
MCV: 93.7 fL (ref 80.0–100.0)
Platelets: 158 10*3/uL (ref 150–400)
RBC: 2.21 MIL/uL — ABNORMAL LOW (ref 4.22–5.81)
RDW: 13.9 % (ref 11.5–15.5)
WBC: 8.8 10*3/uL (ref 4.0–10.5)
nRBC: 0 % (ref 0.0–0.2)

## 2018-10-03 MED ORDER — PENTAFLUOROPROP-TETRAFLUOROETH EX AERO: 1.0000 "application " | INHALATION_SPRAY | CUTANEOUS | Status: DC | PRN

## 2018-10-03 MED ORDER — SODIUM CHLORIDE 0.9 % IV SOLN: 100.0000 mL | INTRAVENOUS | Status: DC | PRN

## 2018-10-03 MED ORDER — DARBEPOETIN ALFA 60 MCG/0.3ML IJ SOSY
PREFILLED_SYRINGE | INTRAMUSCULAR | Status: AC
  Filled 2018-10-03: qty 0.3

## 2018-10-03 MED ORDER — LIDOCAINE HCL (PF) 1 % IJ SOLN: 5.0000 mL | INTRAMUSCULAR | Status: DC | PRN

## 2018-10-03 MED ORDER — DARBEPOETIN ALFA 60 MCG/0.3ML IJ SOSY
60.0000 ug | PREFILLED_SYRINGE | INTRAMUSCULAR | Status: DC
  Administered 2018-10-03: 60 ug via INTRAVENOUS

## 2018-10-03 MED ORDER — LIDOCAINE-PRILOCAINE 2.5-2.5 % EX CREA: 1.0000 "application " | TOPICAL_CREAM | CUTANEOUS | Status: DC | PRN

## 2018-10-03 NOTE — Progress Notes (Signed)
PROGRESS NOTE    Nicholas [name]  CZY:[id number] DOB: 08/23/1930 DOA: 09/27/2018 PCP: No primary care provider on file.   Brief Narrative: 85 year old with past medical history significant for end-stage renal disease on hemodialysis, coronary artery disease recent stent placement, chronic systolic heart failure, recently admitted in July with anterior non-STEMI cath showed high-grade LAD 3 vessels disease with total occlusion of RCA underwent PCI DES proximal L LAD started on aspirin and Plavix and metoprolol.  Admitted with lower GI bleed hemoglobin of 5.1, gastroenterology is following, cardiology notified, conservative management recommended a status post blood transfusion.  Noted to have intermittent maroon stool with clots during hospitalization.    Assessment & Plan:   Principal Problem:   GIB (gastrointestinal bleeding) Active Problems:   Hypertension   Hypercholesteremia   ESRD (end stage renal disease) on dialysis (HCC)   Anemia of chronic disease   Anemia due to blood loss   Type II diabetes mellitus with renal manifestations (HCC)   GERD (gastroesophageal reflux disease)   CAD (coronary artery disease)   Chronic systolic CHF (congestive heart failure) (HCC)   Symptomatic anemia  1-Iron deficiency [place] anemia, suspected lower GI bleed -Patient with history of chronic anemia and iron deficiency anemia. -in the setting of dual antiplatelet therapy. -Last colonoscopy at [facility] 2019 noticed cecal AVM status post ablation -GI Dr. Cristina Gong consulted. -Aspirin and Plavix initially held on admission.  Dr. Broadus John discussed with cardiology Dr. Martinique on 9/4, given recent LAD stent less than 2 months ago, Plavix was restarted.  Aspirin continue to be on hold. -Patient has received 4 units of packed red blood cells so far. Yesterday there were some concern of GI bleed, patient reported no further bowel movement today.  Hemoglobin remained stable at 7. -We will repeat hemoglobin  tonight and in the morning.  Confusion as needed  Coronary artery disease multiple vessels disease: Recent admission in July for non-STEMI status post stent to the LAD. See discussion as above. Continue with Plavix metoprolol and a statin.  Ischemic cardiomyopathy chronic systolic CHF ejection fraction 35%: Continue with Toprol, volume management with hemodialysis  End-stage renal disease on hemodialysis: Nephrology following.  Hyperkalemia: Resolved with hemodialysis  Diabetes type 2: Continue with continuous scale insulin         Estimated body mass index is 23.67 kg/m as calculated from the following:   Height as of this encounter: 5\' 8"  (1.727 m).   Weight as of this encounter: 70.6 kg.   DVT prophylaxis: SCDs Code Status: Full code Family Communication: Care discussed with patient Disposition Plan: Awaiting resolution of GI bleed Consultants:   Dr. Cristina Gong  Dr. Martinique  Nephrology  Procedures:   Dialysis  Antimicrobials:  None  Subjective: Patient denies abdominal pain, no bowel movement since yesterday afternoon.   Objective: Vitals:   10/03/18 1000 10/03/18 1031 10/03/18 1032 10/03/18 1120  BP: (!) 88/44 (!) 82/51 (!) 110/57 (!) 108/50  Pulse: 75 77 81 90  Resp: 19 20 15 18   Temp:   98.3 F (36.8 C) 98.4 F (36.9 C)  TempSrc:   Oral Oral  SpO2:   96% 100%  Weight:   70.6 kg   Height:        Intake/Output Summary (Last 24 hours) at 10/03/2018 1143 Last data filed at 10/03/2018 1032 Gross per 24 hour  Intake 300 ml  Output 2500 ml  Net -2200 ml   Filed Weights   10/01/18 2032 10/03/18 0701 10/03/18 1032  Weight: 72.4  kg 73.3 kg 70.6 kg    Examination:  General exam: Appears calm and comfortable  Respiratory system: Clear to auscultation. Respiratory effort normal. Cardiovascular system: S1 & S2 heard, RRR. No JVD, murmurs, rubs, gallops or clicks. No pedal edema. Gastrointestinal system: Abdomen is nondistended, soft and nontender. No  organomegaly or masses felt. Normal bowel sounds heard. Central nervous system: Alert and oriented. No focal neurological deficits. Extremities: Symmetric 5 x 5 power. Skin: No rashes, lesions or ulcers Psychiatry: Judgement and insight appear normal. Mood & affect appropriate.     Data Reviewed: I have personally reviewed following labs and imaging studies  CBC: Recent Labs  Lab 09/27/18 1907  09/30/18 0546 09/30/18 1314 10/01/18 0440 10/02/18 0526 10/02/18 1508 10/03/18 0506  WBC 8.9   < > 8.7  --  8.3 7.3 8.3 8.8  NEUTROABS 5.9  --   --   --   --   --   --   --   HGB 5.1*   < > 6.7* 8.1* 7.6* 7.1* 7.6* 7.2*  HCT 15.8*   < > 20.6* 24.1* 23.4* 21.5* 22.9* 20.7*  MCV 96.9   < > 96.3  --  96.3 96.4 94.2 93.7  PLT 202   < > 154  --  164 147* 179 158   < > = values in this interval not displayed.   Basic Metabolic Panel: Recent Labs  Lab 09/28/18 0730 09/29/18 0523 09/30/18 0546 10/01/18 0440 10/03/18 0506  NA 138 138 141 141 135  K 5.1 5.4* 4.8 4.8 5.2*  CL 100 98 103 103 100  CO2 28 25 27 24  20*  GLUCOSE 93 87 110* 93 88  BUN 36* 52* 35* 48* 71*  CREATININE 5.32* 7.38* 5.68* 7.41* 10.66*  CALCIUM 8.0* 8.1* 7.7* 7.6* 7.4*   GFR: Estimated Creatinine Clearance: 4.9 mL/min (A) (by C-G formula based on SCr of 10.66 mg/dL (H)). Liver Function Tests: Recent Labs  Lab 09/27/18 1907  AST 21  ALT 18  ALKPHOS 70  BILITOT 0.2*  PROT 6.1*  ALBUMIN 2.9*   No results for input(s): LIPASE, AMYLASE in the last 168 hours. No results for input(s): AMMONIA in the last 168 hours. Coagulation Profile: Recent Labs  Lab 09/27/18 1907  INR 1.0   Cardiac Enzymes: No results for input(s): CKTOTAL, CKMB, CKMBINDEX, TROPONINI in the last 168 hours. BNP (last 3 results) No results for input(s): PROBNP in the last 8760 hours. HbA1C: No results for input(s): HGBA1C in the last 72 hours. CBG: Recent Labs  Lab 10/02/18 1153 10/02/18 1653 10/02/18 2137 10/03/18 0645  10/03/18 1118  GLUCAP 84 131* 118* 89 87   Lipid Profile: No results for input(s): CHOL, HDL, LDLCALC, TRIG, CHOLHDL, LDLDIRECT in the last 72 hours. Thyroid Function Tests: No results for input(s): TSH, T4TOTAL, FREET4, T3FREE, THYROIDAB in the last 72 hours. Anemia Panel: No results for input(s): VITAMINB12, FOLATE, FERRITIN, TIBC, IRON, RETICCTPCT in the last 72 hours. Sepsis Labs: No results for input(s): PROCALCITON, LATICACIDVEN in the last 168 hours.  Recent Results (from the past 240 hour(s))  SARS Coronavirus 2 [facility] order, Performed in [facility] hospital lab) Nasopharyngeal Nasopharyngeal Swab     Status: None   Collection Time: 09/27/18  9:08 PM   Specimen: Nasopharyngeal Swab  Result Value Ref Range Status   SARS Coronavirus 2 NEGATIVE NEGATIVE Final    Comment: (NOTE) If result is NEGATIVE SARS-CoV-2 target nucleic acids are NOT DETECTED. The SARS-CoV-2 RNA is generally detectable in upper  and lower  respiratory specimens during the acute phase of infection. The lowest  concentration of SARS-CoV-2 viral copies this assay can detect is 250  copies / mL. A negative result does not preclude SARS-CoV-2 infection  and should not be used as the sole basis for treatment or other  patient management decisions.  A negative result may occur with  improper specimen collection / handling, submission of specimen other  than nasopharyngeal swab, presence of viral mutation(s) within the  areas targeted by this assay, and inadequate number of viral copies  (<250 copies / mL). A negative result must be combined with clinical  observations, patient history, and epidemiological information. If result is POSITIVE SARS-CoV-2 target nucleic acids are DETECTED. The SARS-CoV-2 RNA is generally detectable in upper and lower  respiratory specimens dur ing the acute phase of infection.  Positive  results are indicative of active infection with SARS-CoV-2.  Clinical  correlation with  patient history and other diagnostic information is  necessary to determine patient infection status.  Positive results do  not rule out bacterial infection or co-infection with other viruses. If result is PRESUMPTIVE POSTIVE SARS-CoV-2 nucleic acids MAY BE PRESENT.   A presumptive positive result was obtained on the submitted specimen  and confirmed on repeat testing.  While 2019 novel coronavirus  (SARS-CoV-2) nucleic acids may be present in the submitted sample  additional confirmatory testing may be necessary for epidemiological  and / or clinical management purposes  to differentiate between  SARS-CoV-2 and other Sarbecovirus currently known to infect humans.  If clinically indicated additional testing with an alternate test  methodology [phone]) is advised. The SARS-CoV-2 RNA is generally  detectable in upper and lower respiratory sp ecimens during the acute  phase of infection. The expected result is Negative. Fact Sheet for Patients:  StrictlyIdeas.no Fact Sheet for Healthcare Providers: BankingDealers.co.za This test is not yet approved or cleared by the [place] FDA and has been authorized for detection and/or diagnosis of SARS-CoV-2 by FDA under an Emergency Use Authorization (EUA).  This EUA will remain in effect (meaning this test can be used) for the duration of the COVID-19 declaration under Section 564(b)(1) of the Act, 21 U.S.C. section 360bbb-3(b)(1), unless the authorization is terminated or revoked sooner. Performed at [facility], [place] [address], [address], [place] [address]   MRSA PCR Screening     Status: None   Collection Time: 09/28/18  1:27 AM   Specimen: Nasal Mucosa; Nasopharyngeal  Result Value Ref Range Status   MRSA by PCR NEGATIVE NEGATIVE Final    Comment:        The GeneXpert MRSA Assay (FDA approved for NASAL specimens only), is one component of a comprehensive MRSA colonization  surveillance program. It is not intended to diagnose MRSA infection nor to guide or monitor treatment for MRSA infections. Performed at [facility], [place] [address], [place], [place] [address]          Radiology Studies: No results found.      Scheduled Meds: . atorvastatin  80 mg Oral q1800  . Chlorhexidine Gluconate Cloth  6 each Topical Q0600  . cinacalcet  30 mg Oral Q breakfast  . clopidogrel  75 mg Oral Daily  . Darbepoetin Alfa      . darbepoetin (ARANESP) injection - DIALYSIS  60 mcg Intravenous Q Wed-HD  . insulin aspart  0-5 Units Subcutaneous QHS  . insulin aspart  0-9 Units Subcutaneous TID WC  . metoprolol succinate  25 mg Oral Daily  . multivitamin  1 tablet Oral QHS  . pantoprazole  40 mg Intravenous Q12H  . polyethylene glycol  17 g Oral BID  . sevelamer carbonate  1,600 mg Oral TID WC   Continuous Infusions:   LOS: 5 days    Time spent: 35 minutes    Elmarie Shiley, MD [facility] Pager [phone]  If 7PM-7AM, please contact night-coverage [email or web address] Password [email or web address] 10/03/2018, 11:43 AM

## 2018-10-03 NOTE — Progress Notes (Signed)
PT Cancellation Note  Patient Details Name: Nicholas [name] MRN: [id number] DOB: 08/24/1930   Cancelled Treatment:    Reason Eval/Treat Not Completed: Patient at procedure or test/unavailable   Currently in HD;  Will follow up later today as time allows;  Otherwise, will follow up for PT tomorrow;   Thank you,  Roney Marion, PT  Acute Rehabilitation Services Pager [phone] Office [phone]     Colletta [place] 10/03/2018, 8:05 AM

## 2018-10-03 NOTE — Plan of Care (Signed)
  Problem: Health Behavior/Discharge Planning: Goal: Ability to manage health-related needs will improve Outcome: Progressing   

## 2018-10-03 NOTE — Progress Notes (Addendum)
Patient was concerned about missing his dialysis today as he has never missed a day. Per patient, he was told by day shift provider that he would go tonight. Night RN did not see order for HD. Patient was upset upon finding out news and requested to speak to an on call nephrologist. Marval Regal, MD was paged. MD spoke with patient and placed order for patient to have HD first thing in the morning.

## 2018-10-03 NOTE — Procedures (Signed)
I was present at this dialysis session. I have reviewed the session itself and made appropriate changes.   Off schedule. 2K bath Goal UF 2.5L.  Using AVF tolerating well.  Hb stable, still says having some blood in BMs   Filed Weights   09/30/18 2136 10/01/18 2032 10/03/18 0701  Weight: 72.3 kg 72.4 kg 73.3 kg    Recent Labs  Lab 10/03/18 0506  NA 135  K 5.2*  CL 100  CO2 20*  GLUCOSE 88  BUN 71*  CREATININE 10.66*  CALCIUM 7.4*    Recent Labs  Lab 09/27/18 1907  10/02/18 0526 10/02/18 1508 10/03/18 0506  WBC 8.9   < > 7.3 8.3 8.8  NEUTROABS 5.9  --   --   --   --   HGB 5.1*   < > 7.1* 7.6* 7.2*  HCT 15.8*   < > 21.5* 22.9* 20.7*  MCV 96.9   < > 96.4 94.2 93.7  PLT 202   < > 147* 179 158   < > = values in this interval not displayed.    Scheduled Meds: . Darbepoetin Alfa      . atorvastatin  80 mg Oral q1800  . Chlorhexidine Gluconate Cloth  6 each Topical Q0600  . cinacalcet  30 mg Oral Q breakfast  . clopidogrel  75 mg Oral Daily  . darbepoetin (ARANESP) injection - DIALYSIS  60 mcg Intravenous Q Wed-HD  . insulin aspart  0-5 Units Subcutaneous QHS  . insulin aspart  0-9 Units Subcutaneous TID WC  . metoprolol succinate  25 mg Oral Daily  . multivitamin  1 tablet Oral QHS  . pantoprazole  40 mg Intravenous Q12H  . polyethylene glycol  17 g Oral BID  . sevelamer carbonate  1,600 mg Oral TID WC   Continuous Infusions: . sodium chloride    . sodium chloride     PRN Meds:.sodium chloride, sodium chloride, acetaminophen **OR** acetaminophen, lidocaine (PF), lidocaine-prilocaine, nitroGLYCERIN, ondansetron **OR** ondansetron (ZOFRAN) IV, pentafluoroprop-tetrafluoroeth   [name]  MD 10/03/2018, 9:13 AM

## 2018-10-03 NOTE — Progress Notes (Signed)
Hemoglobin unchanged over past 24 hours, having gone from 7.1 to 7.2.    Patient tolerated dialysis well, per note.  Pt notes very slight dizziness.  No BM's since yesterday.  Remains on Miralax.  IMPR:  Almost certainly, bleeding has stopped.  RECOMMEND:  1. Consider dischg tomorrow if no worrisome sx, rectal bleeding, or significant drop in hgb.  Cleotis Nipper, M.D. Pager [phone] If no answer or after 5 PM call [phone]

## 2018-10-04 LAB — RENAL FUNCTION PANEL
Albumin: 2.9 g/dL — ABNORMAL LOW (ref 3.5–5.0)
Anion gap: 14 (ref 5–15)
BUN: 29 mg/dL — ABNORMAL HIGH (ref 8–23)
CO2: 24 mmol/L (ref 22–32)
Calcium: 7.7 mg/dL — ABNORMAL LOW (ref 8.9–10.3)
Chloride: 99 mmol/L (ref 98–111)
Creatinine, Ser: 6.95 mg/dL — ABNORMAL HIGH (ref 0.61–1.24)
GFR calc Af Amer: 8 mL/min — ABNORMAL LOW (ref >60)
GFR calc non Af Amer: 7 mL/min — ABNORMAL LOW (ref >60)
Glucose, Bld: 150 mg/dL — ABNORMAL HIGH (ref 70–99)
Phosphorus: 4 mg/dL (ref 2.5–4.6)
Potassium: 3.7 mmol/L (ref 3.5–5.1)
Sodium: 137 mmol/L (ref 135–145)

## 2018-10-04 LAB — CBC
HCT: 21.9 % — ABNORMAL LOW (ref 39.0–52.0)
HCT: 21.9 % — ABNORMAL LOW (ref 39.0–52.0)
Hemoglobin: 7.1 g/dL — ABNORMAL LOW (ref 13.0–17.0)
Hemoglobin: 7.3 g/dL — ABNORMAL LOW (ref 13.0–17.0)
MCH: 31.1 pg (ref 26.0–34.0)
MCH: 31.6 pg (ref 26.0–34.0)
MCHC: 32.4 g/dL (ref 30.0–36.0)
MCHC: 33.3 g/dL (ref 30.0–36.0)
MCV: 96.1 fL (ref 80.0–100.0)
MCV: 94.8 fL (ref 80.0–100.0)
Platelets: 186 10*3/uL (ref 150–400)
Platelets: 188 10*3/uL (ref 150–400)
RBC: 2.28 MIL/uL — ABNORMAL LOW (ref 4.22–5.81)
RBC: 2.31 MIL/uL — ABNORMAL LOW (ref 4.22–5.81)
RDW: 13.8 % (ref 11.5–15.5)
RDW: 13.9 % (ref 11.5–15.5)
WBC: 7.3 10*3/uL (ref 4.0–10.5)
WBC: 7.1 10*3/uL (ref 4.0–10.5)
nRBC: 0 % (ref 0.0–0.2)
nRBC: 0 % (ref 0.0–0.2)

## 2018-10-04 LAB — GLUCOSE, CAPILLARY
Glucose-Capillary: 105 mg/dL — ABNORMAL HIGH (ref 70–99)
Glucose-Capillary: 82 mg/dL (ref 70–99)

## 2018-10-04 LAB — PREPARE RBC (CROSSMATCH)

## 2018-10-04 MED ORDER — PANTOPRAZOLE SODIUM 40 MG PO TBEC: 40.0000 mg | DELAYED_RELEASE_TABLET | Freq: Two times a day (BID) | ORAL | 0 refills | Status: DC

## 2018-10-04 MED ORDER — PANTOPRAZOLE SODIUM 40 MG PO TBEC
40.0000 mg | DELAYED_RELEASE_TABLET | Freq: Two times a day (BID) | ORAL | Status: DC
  Administered 2018-10-04: 40 mg via ORAL
  Filled 2018-10-04 (×2): qty 1

## 2018-10-04 MED ORDER — SODIUM CHLORIDE 0.9% IV SOLUTION: Freq: Once | INTRAVENOUS | Status: DC

## 2018-10-04 MED ORDER — POLYETHYLENE GLYCOL 3350 17 G PO PACK: 17.0000 g | PACK | Freq: Two times a day (BID) | ORAL | 0 refills | Status: DC

## 2018-10-04 NOTE — Progress Notes (Signed)
PT Cancellation Note  Patient Details Name: Laquon Emel MRN: [id number] DOB: 08/28/1930   Cancelled Treatment:    Reason Eval/Treat Not Completed: Other (comment).  Pt is discharged and awaiting his wife.   Ramond Dial 10/04/2018, 1:12 PM   Mee Hives, PT MS Acute Rehab Dept. Number: [place]

## 2018-10-04 NOTE — Progress Notes (Signed)
Hemoglobin stable past 24 hours.  Patient reports no bowel movements.  At this time, I think discharge home is appropriate.  Patient is at quite low risk of rebleeding.  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  Recommendations:  1.  No new medications needed from GI tract standpoint  2.  No dietary or activity restrictions from the GI tract standpoint  3.  I think it would be reasonably safe to restart aspirin, if desired, about a week from now, while maintaining the patient on Plavix in the meantime as he already is.  4.  I gave the patient my card and explained that there is probably about a 5% chance of early rebleeding and a 20% lifetime risk of a recurrent episode of bleeding.  I explained that he is welcome to call us if he has some minor bleeding and wants to talk things over, but realistically, if he has significant bleeding, he needs to go directly to the emergency room.  I will sign off.  Please call me if you want to discuss the patient's case.  Cleotis Nipper, M.D. Pager [phone] If no answer or after 5 PM call [phone]

## 2018-10-04 NOTE — Procedures (Signed)
I was present at this dialysis session. I have reviewed the session itself and made appropriate changes.   Hb 7.1, will rec 1u PRBC. AM chem pending.  No issues for DC today.      Filed Weights   10/03/18 0701 10/03/18 1032 10/04/18 0800  Weight: 73.3 kg 70.6 kg 71.5 kg    Recent Labs  Lab 10/03/18 0506  NA 135  K 5.2*  CL 100  CO2 20*  GLUCOSE 88  BUN 71*  CREATININE 10.66*  CALCIUM 7.4*    Recent Labs  Lab 09/27/18 1907  10/02/18 1508 10/03/18 0506 10/03/18 1929 10/04/18 0444  WBC 8.9   < > 8.3 8.8  --  7.3  NEUTROABS 5.9  --   --   --   --   --   HGB 5.1*   < > 7.6* 7.2* 7.6* 7.1*  HCT 15.8*   < > 22.9* 20.7* 22.8* 21.9*  MCV 96.9   < > 94.2 93.7  --  96.1  PLT 202   < > 179 158  --  186   < > = values in this interval not displayed.    Scheduled Meds: . sodium chloride   Intravenous Once  . atorvastatin  80 mg Oral q1800  . Chlorhexidine Gluconate Cloth  6 each Topical Q0600  . cinacalcet  30 mg Oral Q breakfast  . clopidogrel  75 mg Oral Daily  . darbepoetin (ARANESP) injection - DIALYSIS  60 mcg Intravenous Q Wed-HD  . insulin aspart  0-5 Units Subcutaneous QHS  . insulin aspart  0-9 Units Subcutaneous TID WC  . metoprolol succinate  25 mg Oral Daily  . multivitamin  1 tablet Oral QHS  . pantoprazole  40 mg Intravenous Q12H  . polyethylene glycol  17 g Oral BID  . sevelamer carbonate  1,600 mg Oral TID WC   Continuous Infusions: PRN Meds:.acetaminophen **OR** acetaminophen, nitroGLYCERIN, ondansetron **OR** ondansetron (ZOFRAN) IV   [name]  MD 10/04/2018, 8:30 AM

## 2018-10-04 NOTE — Plan of Care (Signed)
  Problem: Health Behavior/Discharge Planning: Goal: Ability to manage health-related needs will improve Outcome: Progressing   

## 2018-10-04 NOTE — Discharge Instructions (Signed)
You may resume baby aspirin in 1 week from now.   You will need lab work in 3 days to check blood count.

## 2018-10-04 NOTE — Discharge Summary (Signed)
Physician Discharge Summary  Nicholas [name] VVO:[id number] DOB: 24-Nov-1930 DOA: 09/27/2018  PCP: No primary care provider on file.  Admit date: 09/27/2018 Discharge date: 10/04/2018  Admitted From: Home  Disposition:  Home   Recommendations for Outpatient Follow-up:  1. Follow up with PCP in 1-2 weeks 2. Please obtain BMP/CBC in one week 3. May resume baby aspirin in 1 week after discharge.   Home Health: none  Discharge Condition: stable.  CODE STATUS: full code Diet recommendation: Heart Healthy   Brief/Interim Summary: 85 year old with past medical history significant for end-stage renal disease on hemodialysis, coronary artery disease recent stent placement, chronic systolic heart failure, recently admitted in July with anterior non-STEMI cath showed high-grade LAD 3 vessels disease with total occlusion of RCA underwent PCI DES proximal L LAD started on aspirin and Plavix and metoprolol.  Admitted with lower GI bleed hemoglobin of 5.1, gastroenterology is following, cardiology notified, conservative management recommended a status post blood transfusion.  Noted to have intermittent maroon stool with clots during hospitalization.  1-Iron deficiency [place] anemia, suspected lower GI bleed -Patient with history of chronic anemia and iron deficiency anemia. -in the setting of dual antiplatelet therapy. -Last colonoscopy at [facility] 2019 noticed cecal AVM status post ablation -GI Dr. Cristina Gong consulted. -Aspirin and Plavix initially held on admission.  Dr. Broadus John discussed with cardiology Dr. Martinique on 9/4, given recent LAD stent less than 2 months ago, Plavix was restarted.  Aspirin continue to be on hold. -Patient has received 5 units of packed red blood cells during this admission, he last PRBC day of discharge during HD.  -hb remain stable at 7, no further bleeding. Plan to discharge today.   Coronary artery disease multiple vessels disease: Recent admission in July for non-STEMI  status post stent to the LAD. See discussion as above. Continue with Plavix metoprolol and a statin. Plan to resume baby aspirin in 1 week.   Ischemic cardiomyopathy chronic systolic CHF ejection fraction 35%: Continue with Toprol, volume management with hemodialysis  End-stage renal disease on hemodialysis: Nephrology following.  Hyperkalemia: Resolved with hemodialysis  Diabetes type 2: he has not required significant amount of insulin. Will discontinue NPH    Discharge Diagnoses:  Principal Problem:   GIB (gastrointestinal bleeding) Active Problems:   Hypertension   Hypercholesteremia   ESRD (end stage renal disease) on dialysis (HCC)   Anemia of chronic disease   Anemia due to blood loss   Type II diabetes mellitus with renal manifestations (HCC)   GERD (gastroesophageal reflux disease)   CAD (coronary artery disease)   Chronic systolic CHF (congestive heart failure) (HCC)   Symptomatic anemia    Discharge Instructions  Discharge Instructions    Diet - low sodium heart healthy   Complete by: As directed    Increase activity slowly   Complete by: As directed      Allergies as of 10/04/2018      Reactions   Hydrochlorothiazide Other (See Comments)   [facility]      Medication List    STOP taking these medications   aspirin 81 MG chewable tablet   insulin NPH-regular Human (70-30) 100 UNIT/ML injection     TAKE these medications   atorvastatin 80 MG tablet Commonly known as: LIPITOR Take 1 tablet (80 mg total) by mouth daily at 6 PM.   cinacalcet 30 MG tablet Commonly known as: SENSIPAR Take 30 mg by mouth daily.   clopidogrel 75 MG tablet Commonly known as: PLAVIX Take  1 tablet (75 mg total) by mouth daily with breakfast.   metoprolol succinate 25 MG 24 hr tablet Commonly known as: TOPROL-XL Take 1 tablet (25 mg total) by mouth See admin instructions. Take 1/2 tablet (25 mg) on non dialysis days -  Sunday, Monday, Wednesday,  Friday. Take with or immediately following a meal.   nitroGLYCERIN 0.4 MG SL tablet Commonly known as: NITROSTAT Place 1 tablet (0.4 mg total) under the tongue every 5 (five) minutes x 3 doses as needed for chest pain.   pantoprazole 40 MG tablet Commonly known as: PROTONIX Take 1 tablet (40 mg total) by mouth 2 (two) times daily.   polyethylene glycol 17 g packet Commonly known as: MIRALAX / GLYCOLAX Take 17 g by mouth 2 (two) times daily.   PROTEIN PO Take 237 mLs by mouth daily.   sevelamer carbonate 800 MG tablet Commonly known as: RENVELA Take 1,600 mg by mouth 3 (three) times daily with meals.            Durable Medical Equipment  (From admission, onward)         Start     Ordered   10/02/18 1511  For home use only DME 3 n 1  Once     09 /08/20 1510   10/02/18 1510  For home use only DME 4 wheeled rolling walker with seat  Once    Question:  Patient needs a walker to treat with the following condition  Answer:  Decreased functional mobility and endurance   10/02/18 1510         Follow-up Information    [facility] Follow up.   Specialty: [place] Why: a clinician will call you about scheduling an appointment for physical therapy Contact information: [address] [place] [address] [phone]        Ronald Lobo, MD Follow up.   Specialty: Gastroenterology Why: follow up as needed Contact information: [address] [place] [address] [phone]          Allergies  Allergen Reactions  . Hydrochlorothiazide Other (See Comments)    Unknown [facility]    Consultations:  Dr Cristina Gong.      Procedures/Studies:  No results found.   Subjective: No further bowel movement. No blood per rectum  Discharge Exam: Vitals:   10/04/18 1107 10/04/18 1135  BP: 132/68 132/62  Pulse: 81 89  Resp: 15 18  Temp: 97.7 F (36.5 C) 98.1 F (36.7 C)  SpO2: 97% 97%     General: Pt is  alert, awake, not in acute distress Cardiovascular: RRR, S1/S2 +, no rubs, no gallops Respiratory: CTA bilaterally, no wheezing, no rhonchi Abdominal: Soft, NT, ND, bowel sounds + Extremities: no edema, no cyanosis    The results of significant diagnostics from this hospitalization (including imaging, microbiology, ancillary and laboratory) are listed below for reference.     Microbiology: Recent Results (from the past 240 hour(s))  SARS Coronavirus 2 [facility] order, Performed in [facility] hospital lab) Nasopharyngeal Nasopharyngeal Swab     Status: None   Collection Time: 09/27/18  9:08 PM   Specimen: Nasopharyngeal Swab  Result Value Ref Range Status   SARS Coronavirus 2 NEGATIVE NEGATIVE Final    Comment: (NOTE) If result is NEGATIVE SARS-CoV-2 target nucleic acids are NOT DETECTED. The SARS-CoV-2 RNA is generally detectable in upper and lower  respiratory specimens during the acute phase of infection. The lowest  concentration of SARS-CoV-2  viral copies this assay can detect is 250  copies / mL. A negative result does not preclude SARS-CoV-2 infection  and should not be used as the sole basis for treatment or other  patient management decisions.  A negative result may occur with  improper specimen collection / handling, submission of specimen other  than nasopharyngeal swab, presence of viral mutation(s) within the  areas targeted by this assay, and inadequate number of viral copies  (<250 copies / mL). A negative result must be combined with clinical  observations, patient history, and epidemiological information. If result is POSITIVE SARS-CoV-2 target nucleic acids are DETECTED. The SARS-CoV-2 RNA is generally detectable in upper and lower  respiratory specimens dur ing the acute phase of infection.  Positive  results are indicative of active infection with SARS-CoV-2.  Clinical  correlation with patient history and other diagnostic information is  necessary to  determine patient infection status.  Positive results do  not rule out bacterial infection or co-infection with other viruses. If result is PRESUMPTIVE POSTIVE SARS-CoV-2 nucleic acids MAY BE PRESENT.   A presumptive positive result was obtained on the submitted specimen  and confirmed on repeat testing.  While 2019 novel coronavirus  (SARS-CoV-2) nucleic acids may be present in the submitted sample  additional confirmatory testing may be necessary for epidemiological  and / or clinical management purposes  to differentiate between  SARS-CoV-2 and other Sarbecovirus currently known to infect humans.  If clinically indicated additional testing with an alternate test  methodology [phone]) is advised. The SARS-CoV-2 RNA is generally  detectable in upper and lower respiratory sp ecimens during the acute  phase of infection. The expected result is Negative. Fact Sheet for Patients:  StrictlyIdeas.no Fact Sheet for Healthcare Providers: BankingDealers.co.za This test is not yet approved or cleared by the [place] FDA and has been authorized for detection and/or diagnosis of SARS-CoV-2 by FDA under an Emergency Use Authorization (EUA).  This EUA will remain in effect (meaning this test can be used) for the duration of the COVID-19 declaration under Section 564(b)(1) of the Act, 21 U.S.C. section 360bbb-3(b)(1), unless the authorization is terminated or revoked sooner. Performed at [facility], [place] [address], [place], [place] [address]   MRSA PCR Screening     Status: None   Collection Time: 09/28/18  1:27 AM   Specimen: Nasal Mucosa; Nasopharyngeal  Result Value Ref Range Status   MRSA by PCR NEGATIVE NEGATIVE Final    Comment:        The GeneXpert MRSA Assay (FDA approved for NASAL specimens only), is one component of a comprehensive MRSA colonization surveillance program. It is not intended to diagnose MRSA infection nor  to guide or monitor treatment for MRSA infections. Performed at [facility], [place] [address], [place], [place] [address]      Labs: BNP (last 3 results) Recent Labs    08/06/18 1854  BNP 573.2*   Basic Metabolic Panel: Recent Labs  Lab 09/29/18 0523 09/30/18 0546 10/01/18 0440 10/03/18 0506 10/04/18 0834  NA 138 141 141 135 137  K 5.4* 4.8 4.8 5.2* 3.7  CL 98 103 103 100 99  CO2 25 27 24  20* 24  GLUCOSE 87 110* 93 88 150*  BUN 52* 35* 48* 71* 29*  CREATININE 7.38* 5.68* 7.41* 10.66* 6.95*  CALCIUM 8.1* 7.7* 7.6* 7.4* 7.7*  PHOS  --   --   --   --  4.0   Liver Function Tests: Recent  Labs  Lab 09/27/18 1907 10/04/18 0834  AST 21  --   ALT 18  --   ALKPHOS 70  --   BILITOT 0.2*  --   PROT 6.1*  --   ALBUMIN 2.9* 2.9*   No results for input(s): LIPASE, AMYLASE in the last 168 hours. No results for input(s): AMMONIA in the last 168 hours. CBC: Recent Labs  Lab 09/27/18 1907  10/02/18 0526 10/02/18 1508 10/03/18 0506 10/03/18 1929 10/04/18 0444 10/04/18 0834  WBC 8.9   < > 7.3 8.3 8.8  --  7.3 7.1  NEUTROABS 5.9  --   --   --   --   --   --   --   HGB 5.1*   < > 7.1* 7.6* 7.2* 7.6* 7.1* 7.3*  HCT 15.8*   < > 21.5* 22.9* 20.7* 22.8* 21.9* 21.9*  MCV 96.9   < > 96.4 94.2 93.7  --  96.1 94.8  PLT 202   < > 147* 179 158  --  186 188   < > = values in this interval not displayed.   Cardiac Enzymes: No results for input(s): CKTOTAL, CKMB, CKMBINDEX, TROPONINI in the last 168 hours. BNP: Invalid input(s): POCBNP CBG: Recent Labs  Lab 10/03/18 1118 10/03/18 1646 10/03/18 2135 10/04/18 0652 10/04/18 1133  GLUCAP 87 152* 106* 105* 82   D-Dimer No results for input(s): DDIMER in the last 72 hours. Hgb A1c No results for input(s): HGBA1C in the last 72 hours. Lipid Profile No results for input(s): CHOL, HDL, LDLCALC, TRIG, CHOLHDL, LDLDIRECT in the last 72 hours. Thyroid function studies No results for input(s): TSH, T4TOTAL, T3FREE, THYROIDAB  in the last 72 hours.  Invalid input(s): FREET3 Anemia work up No results for input(s): VITAMINB12, FOLATE, FERRITIN, TIBC, IRON, RETICCTPCT in the last 72 hours. Urinalysis No results found for: COLORURINE, APPEARANCEUR, LABSPEC, [place], GLUCOSEU, [place], [place], KETONESUR, PROTEINUR, UROBILINOGEN, NITRITE, LEUKOCYTESUR Sepsis Labs Invalid input(s): PROCALCITONIN,  WBC,  [place] [facility] Recent Results (from the past 240 hour(s))  SARS Coronavirus 2 [facility] order, Performed in [facility] hospital lab) Nasopharyngeal Nasopharyngeal Swab     Status: None   Collection Time: 09/27/18  9:08 PM   Specimen: Nasopharyngeal Swab  Result Value Ref Range Status   SARS Coronavirus 2 NEGATIVE NEGATIVE Final    Comment: (NOTE) If result is NEGATIVE SARS-CoV-2 target nucleic acids are NOT DETECTED. The SARS-CoV-2 RNA is generally detectable in upper and lower  respiratory specimens during the acute phase of infection. The lowest  concentration of SARS-CoV-2 viral copies this assay can detect is 250  copies / mL. A negative result does not preclude SARS-CoV-2 infection  and should not be used as the sole basis for treatment or other  patient management decisions.  A negative result may occur with  improper specimen collection / handling, submission of specimen other  than nasopharyngeal swab, presence of viral mutation(s) within the  areas targeted by this assay, and inadequate number of viral copies  (<250 copies / mL). A negative result must be combined with clinical  observations, patient history, and epidemiological information. If result is POSITIVE SARS-CoV-2 target nucleic acids are DETECTED. The SARS-CoV-2 RNA is generally detectable in upper and lower  respiratory specimens dur ing the acute phase of infection.  Positive  results are indicative of active infection with SARS-CoV-2.  Clinical  correlation with patient history and other diagnostic information is  necessary  to determine patient infection status.  Positive results do  not rule out bacterial infection or co-infection with other viruses. If result is PRESUMPTIVE POSTIVE SARS-CoV-2 nucleic acids MAY BE PRESENT.   A presumptive positive result was obtained on the submitted specimen  and confirmed on repeat testing.  While 2019 novel coronavirus  (SARS-CoV-2) nucleic acids may be present in the submitted sample  additional confirmatory testing may be necessary for epidemiological  and / or clinical management purposes  to differentiate between  SARS-CoV-2 and other Sarbecovirus currently known to infect humans.  If clinically indicated additional testing with an alternate test  methodology [phone]) is advised. The SARS-CoV-2 RNA is generally  detectable in upper and lower respiratory sp ecimens during the acute  phase of infection. The expected result is Negative. Fact Sheet for Patients:  StrictlyIdeas.no Fact Sheet for Healthcare Providers: BankingDealers.co.za This test is not yet approved or cleared by the [place] FDA and has been authorized for detection and/or diagnosis of SARS-CoV-2 by FDA under an Emergency Use Authorization (EUA).  This EUA will remain in effect (meaning this test can be used) for the duration of the COVID-19 declaration under Section 564(b)(1) of the Act, 21 U.S.C. section 360bbb-3(b)(1), unless the authorization is terminated or revoked sooner. Performed at [facility], [place] [address] [place], [place] [address]   MRSA PCR Screening     Status: None   Collection Time: 09/28/18  1:27 AM   Specimen: Nasal Mucosa; Nasopharyngeal  Result Value Ref Range Status   MRSA by PCR NEGATIVE NEGATIVE Final    Comment:        The GeneXpert MRSA Assay (FDA approved for NASAL specimens only), is one component of a comprehensive MRSA colonization surveillance program. It is not intended to diagnose MRSA infection  nor to guide or monitor treatment for MRSA infections. Performed at [facility], [place] [address], [place], [place] [address]      Time coordinating discharge: 40 minutes  SIGNED:   Elmarie Shiley, MD  [facility]

## 2018-10-04 NOTE — Consult Note (Signed)
   [facility] CM Inpatient Consult   10/04/2018  Nicholas [name] 03/02/1930 [id number]    Patient evaluated for [name] service needs for a 23% high risk score for unplanned readmission and hospitalization underhis Medicare/ NextGenplan.  Chart review and MD brief narrative on 10/03/18 show as follows: Patient was admitted with lower GI bleed, hemoglobin of 5.1, Gastroenterology is following, cardiology notified, conservative management recommended and status post blood transfusion. Noted to have intermittent maroon stool with clots during hospitalization.  Called and spoke to patient in his room. HIPAA information obtained. Patient reports that he is likely to discharge home today with home health arranged. Patient reports that medications are filled through the [place] and confirmed that hisprimary care provider isno longer with [facility], rather, currently seeing Dr. Sherral Hammers at [place] [facility] who isNOT a [facility] provider and not affiliated with [employer]  Patient is NOT currently a beneficiary of the attributed [place] in the [employer].   Reason:The patient's PCP is not a [facility] primary care provider and is not [facility] affiliated.  This patient is Not eligible for [facility] Care Management Services.    Transition of care CM note review, states that prior to admission, patient lives at home with spouse. [facility] and will [facility] to home with home health services ([facility]).   For questions and additional information, please call:  Sheva Mcdougle A. Ileane Sando, BSN, RN-BC [facility] Liaison Cell: [phone]

## 2018-10-04 NOTE — Progress Notes (Signed)
DISCHARGE NOTE HOME Jahel Wavra to be discharged Home per MD order. Discussed prescriptions and follow up appointments with the patient. Prescriptions given to patient; medication list explained in detail. Patient verbalized understanding.  Skin clean, dry and intact without evidence of skin break down, no evidence of skin tears noted. IV catheter discontinued intact. Site without signs and symptoms of complications. Dressing and pressure applied. Pt denies pain at the site currently. No complaints noted.  Patient free of lines, drains, and wounds.   An After Visit Summary (AVS) was printed and given to the patient. Patient escorted via wheelchair, and discharged home via private auto.  Aneta Mins BSN, RN3

## 2018-10-04 NOTE — Progress Notes (Signed)
Renal Navigator faxed H&P, most recent Renal note and Discharge Summary to OP HD clinic/[facility] to provide continuity of care.  Alphonzo Cruise, [place] Renal Navigator [phone]

## 2018-10-05 LAB — TYPE AND SCREEN
ABO/RH(D): A NEG
Antibody Screen: NEGATIVE
Unit division: 0

## 2018-10-05 LAB — BPAM RBC
Blood Product Expiration Date: 202009292359
ISSUE DATE / TIME: 202009100941
Unit Type and Rh: 600

## 2018-10-08 DIAGNOSIS — I132 Hypertensive heart and chronic kidney disease with heart failure and with stage 5 chronic kidney disease, or end stage renal disease: Secondary | ICD-10-CM | POA: Diagnosis not present

## 2018-10-08 DIAGNOSIS — Z992 Dependence on renal dialysis: Secondary | ICD-10-CM | POA: Diagnosis not present

## 2018-10-08 DIAGNOSIS — I2510 Atherosclerotic heart disease of native coronary artery without angina pectoris: Secondary | ICD-10-CM | POA: Diagnosis not present

## 2018-10-08 DIAGNOSIS — Z87891 Personal history of nicotine dependence: Secondary | ICD-10-CM | POA: Diagnosis not present

## 2018-10-08 DIAGNOSIS — K219 Gastro-esophageal reflux disease without esophagitis: Secondary | ICD-10-CM | POA: Diagnosis not present

## 2018-10-08 DIAGNOSIS — E7800 Pure hypercholesterolemia, unspecified: Secondary | ICD-10-CM | POA: Diagnosis not present

## 2018-10-08 DIAGNOSIS — E1122 Type 2 diabetes mellitus with diabetic chronic kidney disease: Secondary | ICD-10-CM | POA: Diagnosis not present

## 2018-10-08 DIAGNOSIS — I5022 Chronic systolic (congestive) heart failure: Secondary | ICD-10-CM | POA: Diagnosis not present

## 2018-10-08 DIAGNOSIS — M1990 Unspecified osteoarthritis, unspecified site: Secondary | ICD-10-CM | POA: Diagnosis not present

## 2018-10-08 DIAGNOSIS — D500 Iron deficiency anemia secondary to blood loss (chronic): Secondary | ICD-10-CM | POA: Diagnosis not present

## 2018-10-08 DIAGNOSIS — I252 Old myocardial infarction: Secondary | ICD-10-CM | POA: Diagnosis not present

## 2018-10-08 DIAGNOSIS — D631 Anemia in chronic kidney disease: Secondary | ICD-10-CM | POA: Diagnosis not present

## 2018-10-08 DIAGNOSIS — L409 Psoriasis, unspecified: Secondary | ICD-10-CM | POA: Diagnosis not present

## 2018-10-08 DIAGNOSIS — Z87440 Personal history of urinary (tract) infections: Secondary | ICD-10-CM | POA: Diagnosis not present

## 2018-10-08 DIAGNOSIS — N186 End stage renal disease: Secondary | ICD-10-CM | POA: Diagnosis not present

## 2018-10-08 DIAGNOSIS — Z955 Presence of coronary angioplasty implant and graft: Secondary | ICD-10-CM | POA: Diagnosis not present

## 2018-11-05 DIAGNOSIS — K5790 Diverticulosis of intestine, part unspecified, without perforation or abscess without bleeding: Secondary | ICD-10-CM | POA: Diagnosis not present

## 2018-11-05 DIAGNOSIS — K219 Gastro-esophageal reflux disease without esophagitis: Secondary | ICD-10-CM | POA: Diagnosis not present

## 2018-11-05 DIAGNOSIS — K5900 Constipation, unspecified: Secondary | ICD-10-CM | POA: Diagnosis not present

## 2018-11-06 NOTE — Progress Notes (Signed)
Cardiology Office Note:    Date:  11/07/2018   ID:  Nicholas [name], DOB 1930-07-27, MRN [id number]  PCP:  Patient, No Pcp Per  Cardiologist:  Sinclair Grooms, MD   Referring MD: Carol Ada, MD   Chief Complaint  Patient presents with  . Coronary Artery Disease    History of Present Illness:    Nicholas [name] is a 85 y.o. male with a hx of CAD, chronic systolic CHF, ESRD on HD TTS, IDT2DM, HTN, HLD, anemia of chronic disease requiring transfusion .  Non-ST elevation MI June 2020 ultimately treated LAD with orbital atherectomy and stenting (Dr. Saundra Shelling)  Nicholas [name] has had no recurrence of the substernal discomfort that he had that led to admission and diagnosis of non-ST elevation MI.  2 months later he was admitted to the hospital with diverticular bleeding.  Multiple transfusions were required.  Ultimately aspirin was discontinued after both aspirin and Plavix were paused for a week.  He had no ischemic complications.  Currently tolerating dialysis without difficulty.  No nitroglycerin use.   Past Medical History:  Diagnosis Date  . Arthritis   . Chronic kidney disease   . Diabetes mellitus ([place])   . Dialysis patient ([place])   . GERD (gastroesophageal reflux disease)    occ  . Heart murmur   . Hypercholesteremia   . Hypertension   . Psoriasis     Past Surgical History:  Procedure Laterality Date  . CARDIAC CATHETERIZATION  09   "some blockage" with collaterals by cath at [facility] ~ 2009; no intervention required; reportedly, no routine cardiology f/u recommended   . CORONARY ATHERECTOMY N/A 08/09/2018   Procedure: CORONARY ATHERECTOMY;  Surgeon: Jettie Booze, MD;  Location: [place] [facility];  Service: Cardiovascular;  Laterality: N/A;  . CORONARY STENT INTERVENTION N/A 08/09/2018   Procedure: CORONARY STENT INTERVENTION;  Surgeon: Jettie Booze, MD;  Location: [place] [facility];  Service: Cardiovascular;  Laterality: N/A;  . INGUINAL HERNIA REPAIR Right  04/13/2012   Procedure: HERNIA REPAIR INGUINAL INCARCERATED;  Surgeon: Gayland Curry, MD;  Location: [place];  Service: General;  Laterality: Right;  . INSERTION OF MESH Right 04/13/2012   Procedure: INSERTION OF MESH;  Surgeon: Gayland Curry, MD;  Location: [place];  Service: General;  Laterality: Right;  . LEFT HEART CATH N/A 08/09/2018   Procedure: Left Heart Cath;  Surgeon: Jettie Booze, MD;  Location: [place] [facility];  Service: Cardiovascular;  Laterality: N/A;  . LEFT HEART CATH AND CORONARY ANGIOGRAPHY N/A 08/08/2018   Procedure: LEFT HEART CATH AND CORONARY ANGIOGRAPHY;  Surgeon: Troy Sine, MD;  Location: [place] [facility];  Service: Cardiovascular;  Laterality: N/A;  . VASECTOMY      Current Medications: Current Meds  Medication Sig  . atorvastatin (LIPITOR) 80 MG tablet Take 1 tablet (80 mg total) by mouth daily at 6 PM.  . cinacalcet (SENSIPAR) 30 MG tablet Take 30 mg by mouth daily.  . clopidogrel (PLAVIX) 75 MG tablet Take 1 tablet (75 mg total) by mouth daily with breakfast.  . metoprolol succinate (TOPROL-XL) 25 MG 24 hr tablet Take 1 tablet (25 mg total) by mouth See admin instructions. Take 1/2 tablet (25 mg) on non dialysis days -  Sunday, Monday, Wednesday, Friday. Take with or immediately following a meal.  . nitroGLYCERIN (NITROSTAT) 0.4 MG SL tablet Place 1 tablet (0.4 mg total) under the tongue every 5 (five) minutes x 3 doses as needed for  chest pain.  . pantoprazole (PROTONIX) 40 MG tablet Take 1 tablet (40 mg total) by mouth 2 (two) times daily.  . polyethylene glycol (MIRALAX / GLYCOLAX) 17 g packet Take 17 g by mouth 2 (two) times daily.  Marland Kitchen PROTEIN PO Take 237 mLs by mouth daily.  . sevelamer carbonate (RENVELA) 800 MG tablet Take 1,600 mg by mouth 3 (three) times daily with meals.      Allergies:   Hydrochlorothiazide   Social History   Socioeconomic History  . Marital status: Married    Spouse name: Not on file  . Number of children: Not on file   . Years of education: Not on file  . Highest education level: Not on file  Occupational History  . Not on file  Social Needs  . Financial resource strain: Not on file  . Food insecurity    Worry: Not on file    Inability: Not on file  . Transportation needs    Medical: Not on file    Non-medical: Not on file  Tobacco Use  . Smoking status: Former Smoker    Packs/day: 1.00    Years: 15.00    Pack years: 15.00    Types: Cigarettes    Quit date: 03/29/1987    Years since quitting: 31.6  . Smokeless tobacco: Never Used  Substance and Sexual Activity  . Alcohol use: No  . Drug use: No  . Sexual activity: Not on file  Lifestyle  . Physical activity    Days per week: Not on file    Minutes per session: Not on file  . Stress: Not on file  Relationships  . Social Herbalist on phone: Not on file    Gets together: Not on file    Attends religious service: Not on file    Active member of club or organization: Not on file    Attends meetings of clubs or organizations: Not on file    Relationship status: Not on file  Other Topics Concern  . Not on file  Social History Narrative  . Not on file     Family History: The patient's family history includes Diabetes in his brother, mother, and sister; Stroke in his father.  ROS:   Please see the history of present illness.    Some episodes of hypotension on dialysis.  He has some difficulty with sleeping.  He awakens with a sensation of breathlessness on occasion.  He sleeps no longer than 2 hours.  He does not snore according to his wife.  Heart is not racing or pounding.  All other systems reviewed and are negative.  EKGs/Labs/Other Studies Reviewed:    The following studies were reviewed today: No new data  EKG:  EKG no new data  Recent Labs: 08/06/2018: B Natriuretic Peptide 662.8 09/27/2018: ALT 18 10/04/2018: BUN 29; Creatinine, Ser 6.95; Hemoglobin 7.3; Platelets 188; Potassium 3.7; Sodium 137  Recent Lipid Panel     Component Value Date/Time   CHOL 202 (H) 08/06/2018 1854   TRIG 200 (H) 08/06/2018 1854   HDL 68 08/06/2018 1854   CHOLHDL 3.0 08/06/2018 1854   VLDL 40 08/06/2018 1854   LDLCALC NOT CALCULATED 08/06/2018 1854    Physical Exam:    VS:  BP (!) 120/54   Pulse 84   Ht 5\' 8"  (1.727 m)   Wt 164 lb (74.4 kg)   SpO2 95%   BMI 24.94 kg/m     Wt Readings from Last 3  Encounters:  11/07/18 164 lb (74.4 kg)  10/04/18 154 lb 5.2 oz (70 kg)  08/17/18 163 lb (73.9 kg)     GEN: Appears younger than stated age. No acute distress HEENT: Normal NECK: No JVD. LYMPHATICS: No lymphadenopathy CARDIAC:  RRR without murmur, gallop, or edema. VASCULAR:  Normal Pulses. No bruits. RESPIRATORY:  Clear to auscultation without rales, wheezing or rhonchi  ABDOMEN: Soft, non-tender, non-distended, No pulsatile mass, MUSCULOSKELETAL: No deformity  SKIN: Warm and dry NEUROLOGIC:  Alert and oriented x 3 PSYCHIATRIC:  Normal affect   ASSESSMENT:    1. Coronary artery disease involving native coronary artery of native heart with unstable angina pectoris ([place])   2. Chronic combined systolic and diastolic CHF (congestive heart failure) ([place])   3. NSTEMI (non-ST elevated myocardial infarction) ([place])   4. Diabetes mellitus due to underlying condition with hyperosmolarity without coma, with long-term current use of insulin ([place])   5. ESRD on dialysis [facility])    PLAN:    In order of problems listed above:  1. Secondary prevention is discussed.  He is on monotherapy because of GI bleeding.  Plavix is being well-tolerated without recurrent bleeding.  No ischemic symptoms.  Since the LAD stent was placed and acute coronary syndrome circumstances, he will need to remain on Plavix at least until July 2021. 2. No evidence of volume overload.  Volume is being controlled by dialysis. 3. I correlated his symptoms on presentation with his identified LAD disease and he is informed that recurrent symptoms should  receive attention and lead him to notify us as soon as possible. 4. A1c less than 7 is at target. 5. Continue dialysis. 6. The 3 double use are discussed and endorsed by the patient   Medication Adjustments/Labs and Tests Ordered: Current medicines are reviewed at length with the patient today.  Concerns regarding medicines are outlined above.  No orders of the defined types were placed in this encounter.  No orders of the defined types were placed in this encounter.   Patient Instructions  Medication Instructions:  Your physician recommends that you continue on your current medications as directed. Please refer to the Current Medication list given to you today.  If you need a refill on your cardiac medications before your next appointment, please call your pharmacy.   Lab work: None If you have labs (blood work) drawn today and your tests are completely normal, you will receive your results only by: Marland Kitchen MyChart Message (if you have MyChart) OR . A paper copy in the mail If you have any lab test that is abnormal or we need to change your treatment, we will call you to review the results.  Testing/Procedures: None  Follow-Up: At [facility], you and your health needs are our priority.  As part of our continuing mission to provide you with exceptional heart care, we have created designated Provider Care Teams.  These Care Teams include your primary Cardiologist (physician) and Advanced Practice Providers (APPs -  Physician Assistants and Nurse Practitioners) who all work together to provide you with the care you need, when you need it. You will need a follow up appointment in 6 months.  Please call our office 2 months in advance to schedule this appointment.  You may see Sinclair Grooms, MD or one of the following Advanced Practice Providers on your designated Care Team:   Truitt Merle, NP Cecilie Kicks, NP . Kathyrn Drown, NP  Any Other Special Instructions Will Be Listed Below  (  If Applicable).       Signed, Sinclair Grooms, MD  11/07/2018 10:38 AM    [place]

## 2018-11-07 ENCOUNTER — Other Ambulatory Visit: Payer: Self-pay

## 2018-11-07 ENCOUNTER — Encounter: Payer: Self-pay | Admitting: Interventional Cardiology

## 2018-11-07 ENCOUNTER — Ambulatory Visit (INDEPENDENT_AMBULATORY_CARE_PROVIDER_SITE_OTHER): Payer: Medicare Other | Admitting: Interventional Cardiology

## 2018-11-07 VITALS — BP 120/54 | HR 84 | Ht 68.0 in | Wt 164.0 lb

## 2018-11-07 DIAGNOSIS — Z794 Long term (current) use of insulin: Secondary | ICD-10-CM | POA: Diagnosis not present

## 2018-11-07 DIAGNOSIS — I252 Old myocardial infarction: Secondary | ICD-10-CM | POA: Diagnosis not present

## 2018-11-07 DIAGNOSIS — Z87440 Personal history of urinary (tract) infections: Secondary | ICD-10-CM | POA: Diagnosis not present

## 2018-11-07 DIAGNOSIS — E0800 Diabetes mellitus due to underlying condition with hyperosmolarity without nonketotic hyperglycemic-hyperosmolar coma (NKHHC): Secondary | ICD-10-CM | POA: Diagnosis not present

## 2018-11-07 DIAGNOSIS — N186 End stage renal disease: Secondary | ICD-10-CM | POA: Diagnosis not present

## 2018-11-07 DIAGNOSIS — I214 Non-ST elevation (NSTEMI) myocardial infarction: Secondary | ICD-10-CM | POA: Diagnosis not present

## 2018-11-07 DIAGNOSIS — Z955 Presence of coronary angioplasty implant and graft: Secondary | ICD-10-CM | POA: Diagnosis not present

## 2018-11-07 DIAGNOSIS — E1122 Type 2 diabetes mellitus with diabetic chronic kidney disease: Secondary | ICD-10-CM | POA: Diagnosis not present

## 2018-11-07 DIAGNOSIS — Z992 Dependence on renal dialysis: Secondary | ICD-10-CM | POA: Diagnosis not present

## 2018-11-07 DIAGNOSIS — Z7189 Other specified counseling: Secondary | ICD-10-CM | POA: Diagnosis not present

## 2018-11-07 DIAGNOSIS — L409 Psoriasis, unspecified: Secondary | ICD-10-CM | POA: Diagnosis not present

## 2018-11-07 DIAGNOSIS — Z87891 Personal history of nicotine dependence: Secondary | ICD-10-CM | POA: Diagnosis not present

## 2018-11-07 DIAGNOSIS — M1990 Unspecified osteoarthritis, unspecified site: Secondary | ICD-10-CM | POA: Diagnosis not present

## 2018-11-07 DIAGNOSIS — D500 Iron deficiency anemia secondary to blood loss (chronic): Secondary | ICD-10-CM | POA: Diagnosis not present

## 2018-11-07 DIAGNOSIS — I132 Hypertensive heart and chronic kidney disease with heart failure and with stage 5 chronic kidney disease, or end stage renal disease: Secondary | ICD-10-CM | POA: Diagnosis not present

## 2018-11-07 DIAGNOSIS — I5042 Chronic combined systolic (congestive) and diastolic (congestive) heart failure: Secondary | ICD-10-CM

## 2018-11-07 DIAGNOSIS — D631 Anemia in chronic kidney disease: Secondary | ICD-10-CM | POA: Diagnosis not present

## 2018-11-07 DIAGNOSIS — K219 Gastro-esophageal reflux disease without esophagitis: Secondary | ICD-10-CM | POA: Diagnosis not present

## 2018-11-07 DIAGNOSIS — E7800 Pure hypercholesterolemia, unspecified: Secondary | ICD-10-CM | POA: Diagnosis not present

## 2018-11-07 DIAGNOSIS — I2510 Atherosclerotic heart disease of native coronary artery without angina pectoris: Secondary | ICD-10-CM | POA: Diagnosis not present

## 2018-11-07 DIAGNOSIS — I5022 Chronic systolic (congestive) heart failure: Secondary | ICD-10-CM | POA: Diagnosis not present

## 2018-11-07 DIAGNOSIS — I25110 Atherosclerotic heart disease of native coronary artery with unstable angina pectoris: Secondary | ICD-10-CM

## 2018-11-07 NOTE — Patient Instructions (Signed)

## 2018-11-09 DIAGNOSIS — N186 End stage renal disease: Secondary | ICD-10-CM | POA: Diagnosis not present

## 2018-11-09 DIAGNOSIS — I132 Hypertensive heart and chronic kidney disease with heart failure and with stage 5 chronic kidney disease, or end stage renal disease: Secondary | ICD-10-CM | POA: Diagnosis not present

## 2018-11-09 DIAGNOSIS — E1122 Type 2 diabetes mellitus with diabetic chronic kidney disease: Secondary | ICD-10-CM | POA: Diagnosis not present

## 2018-11-09 DIAGNOSIS — I5022 Chronic systolic (congestive) heart failure: Secondary | ICD-10-CM | POA: Diagnosis not present

## 2018-11-09 DIAGNOSIS — D500 Iron deficiency anemia secondary to blood loss (chronic): Secondary | ICD-10-CM | POA: Diagnosis not present

## 2018-11-09 DIAGNOSIS — D631 Anemia in chronic kidney disease: Secondary | ICD-10-CM | POA: Diagnosis not present

## 2018-11-28 DIAGNOSIS — E1159 Type 2 diabetes mellitus with other circulatory complications: Secondary | ICD-10-CM | POA: Diagnosis not present

## 2018-11-28 DIAGNOSIS — L84 Corns and callosities: Secondary | ICD-10-CM | POA: Diagnosis not present

## 2018-11-28 DIAGNOSIS — B351 Tinea unguium: Secondary | ICD-10-CM | POA: Diagnosis not present

## 2018-12-07 DIAGNOSIS — I5022 Chronic systolic (congestive) heart failure: Secondary | ICD-10-CM | POA: Diagnosis not present

## 2018-12-07 DIAGNOSIS — Z955 Presence of coronary angioplasty implant and graft: Secondary | ICD-10-CM | POA: Diagnosis not present

## 2018-12-07 DIAGNOSIS — Z992 Dependence on renal dialysis: Secondary | ICD-10-CM | POA: Diagnosis not present

## 2018-12-07 DIAGNOSIS — Z7902 Long term (current) use of antithrombotics/antiplatelets: Secondary | ICD-10-CM | POA: Diagnosis not present

## 2018-12-07 DIAGNOSIS — N186 End stage renal disease: Secondary | ICD-10-CM | POA: Diagnosis not present

## 2018-12-07 DIAGNOSIS — Z87440 Personal history of urinary (tract) infections: Secondary | ICD-10-CM | POA: Diagnosis not present

## 2018-12-07 DIAGNOSIS — E7800 Pure hypercholesterolemia, unspecified: Secondary | ICD-10-CM | POA: Diagnosis not present

## 2018-12-07 DIAGNOSIS — I252 Old myocardial infarction: Secondary | ICD-10-CM | POA: Diagnosis not present

## 2018-12-07 DIAGNOSIS — Z87891 Personal history of nicotine dependence: Secondary | ICD-10-CM | POA: Diagnosis not present

## 2018-12-07 DIAGNOSIS — E1122 Type 2 diabetes mellitus with diabetic chronic kidney disease: Secondary | ICD-10-CM | POA: Diagnosis not present

## 2018-12-07 DIAGNOSIS — D500 Iron deficiency anemia secondary to blood loss (chronic): Secondary | ICD-10-CM | POA: Diagnosis not present

## 2018-12-07 DIAGNOSIS — L409 Psoriasis, unspecified: Secondary | ICD-10-CM | POA: Diagnosis not present

## 2018-12-07 DIAGNOSIS — M1990 Unspecified osteoarthritis, unspecified site: Secondary | ICD-10-CM | POA: Diagnosis not present

## 2018-12-07 DIAGNOSIS — I132 Hypertensive heart and chronic kidney disease with heart failure and with stage 5 chronic kidney disease, or end stage renal disease: Secondary | ICD-10-CM | POA: Diagnosis not present

## 2018-12-07 DIAGNOSIS — K219 Gastro-esophageal reflux disease without esophagitis: Secondary | ICD-10-CM | POA: Diagnosis not present

## 2018-12-07 DIAGNOSIS — I2510 Atherosclerotic heart disease of native coronary artery without angina pectoris: Secondary | ICD-10-CM | POA: Diagnosis not present

## 2019-01-31 ENCOUNTER — Telehealth: Payer: Self-pay | Admitting: Interventional Cardiology

## 2019-01-31 NOTE — Telephone Encounter (Signed)
The patient's wife Phineas Semen) called to report that the patient's BP has been low 19-694 systolic, causing the patient to faint.  The patient has had 2 syncopal episodes 1/6 @ 4 pm and 1/7 @ 2 am.  Prior to the 1/7 episode, the patient had "mild chest discomfort" so took a NTG, which contributed in lowering BP.  The episode lasts "a couple minutes".  He has dialysis today @ 11:00 and will get further monitoring and report episodes.  They will f/u with [place], if episodes recur.

## 2019-01-31 NOTE — Telephone Encounter (Signed)
Pt c/o Syncope: STAT if syncope occurred within 30 minutes and pt complains of lightheadedness High Priority if episode of passing out, completely, today or in last 24 hours   1. Did you pass out today? Yes  2. When is the last time you passed out? Wife states patient passed out once around 4-5P on 01/30/19 and once around 1-2A on 01/31/19  3. Has this occurred multiple times? Yes  4. Did you have any symptoms prior to passing out? Nausea  Pt c/o BP issue: STAT if pt c/o blurred vision, one-sided weakness or slurred speech  1. What are your last 5 BP readings?  80/40 83/42 90/50  82/42 100/50  2. Are you having any other symptoms (ex. Dizziness, headache, blurred vision, passed out)? Dizziness, nausea  3. What is your BP issue? Low BP

## 2019-02-01 NOTE — Telephone Encounter (Signed)
Attempted to contact wife and pt.  Phone rang several times with no answer and no VM.  Unable to leave message.

## 2019-02-01 NOTE — Telephone Encounter (Signed)
Nephrology needs to be informed so that volume status can be controlled.

## 2019-02-05 NOTE — Telephone Encounter (Signed)
Spoke with wife and made her aware of recommendations.  She did let dialysis at the [place] know about this last week and they drew labs.  Per wife, Hgb was down to 5 so pt ended up admitted to [facility] hospital and was just released yesterday.  She states "they found a tear in his colon and cauterized it".

## 2019-03-17 ENCOUNTER — Encounter (HOSPITAL_BASED_OUTPATIENT_CLINIC_OR_DEPARTMENT_OTHER): Payer: Self-pay | Admitting: Emergency Medicine

## 2019-03-17 ENCOUNTER — Emergency Department (HOSPITAL_BASED_OUTPATIENT_CLINIC_OR_DEPARTMENT_OTHER): Payer: Medicare Other

## 2019-03-17 ENCOUNTER — Inpatient Hospital Stay (HOSPITAL_BASED_OUTPATIENT_CLINIC_OR_DEPARTMENT_OTHER)
Admission: EM | Admit: 2019-03-17 | Discharge: 2019-03-20 | DRG: 811 | Disposition: A | Discharge status: Home Health | Payer: Medicare Other | Attending: Internal Medicine | Admitting: Internal Medicine

## 2019-03-17 ENCOUNTER — Other Ambulatory Visit: Payer: Self-pay

## 2019-03-17 DIAGNOSIS — K08109 Complete loss of teeth, unspecified cause, unspecified class: Secondary | ICD-10-CM | POA: Diagnosis present

## 2019-03-17 DIAGNOSIS — I25118 Atherosclerotic heart disease of native coronary artery with other forms of angina pectoris: Secondary | ICD-10-CM | POA: Diagnosis not present

## 2019-03-17 DIAGNOSIS — I248 Other forms of acute ischemic heart disease: Secondary | ICD-10-CM | POA: Diagnosis present

## 2019-03-17 DIAGNOSIS — E11649 Type 2 diabetes mellitus with hypoglycemia without coma: Secondary | ICD-10-CM | POA: Diagnosis present

## 2019-03-17 DIAGNOSIS — D500 Iron deficiency anemia secondary to blood loss (chronic): Principal | ICD-10-CM | POA: Diagnosis present

## 2019-03-17 DIAGNOSIS — D638 Anemia in other chronic diseases classified elsewhere: Secondary | ICD-10-CM | POA: Diagnosis not present

## 2019-03-17 DIAGNOSIS — E0800 Diabetes mellitus due to underlying condition with hyperosmolarity without nonketotic hyperglycemic-hyperosmolar coma (NKHHC): Secondary | ICD-10-CM | POA: Diagnosis not present

## 2019-03-17 DIAGNOSIS — Z8719 Personal history of other diseases of the digestive system: Secondary | ICD-10-CM

## 2019-03-17 DIAGNOSIS — Z992 Dependence on renal dialysis: Secondary | ICD-10-CM | POA: Diagnosis not present

## 2019-03-17 DIAGNOSIS — R195 Other fecal abnormalities: Secondary | ICD-10-CM | POA: Diagnosis present

## 2019-03-17 DIAGNOSIS — N186 End stage renal disease: Secondary | ICD-10-CM

## 2019-03-17 DIAGNOSIS — I10 Essential (primary) hypertension: Secondary | ICD-10-CM | POA: Diagnosis present

## 2019-03-17 DIAGNOSIS — D631 Anemia in chronic kidney disease: Secondary | ICD-10-CM | POA: Diagnosis present

## 2019-03-17 DIAGNOSIS — Z955 Presence of coronary angioplasty implant and graft: Secondary | ICD-10-CM

## 2019-03-17 DIAGNOSIS — I447 Left bundle-branch block, unspecified: Secondary | ICD-10-CM | POA: Diagnosis present

## 2019-03-17 DIAGNOSIS — Z794 Long term (current) use of insulin: Secondary | ICD-10-CM | POA: Diagnosis not present

## 2019-03-17 DIAGNOSIS — Z905 Acquired absence of kidney: Secondary | ICD-10-CM

## 2019-03-17 DIAGNOSIS — N2581 Secondary hyperparathyroidism of renal origin: Secondary | ICD-10-CM | POA: Diagnosis present

## 2019-03-17 DIAGNOSIS — Z888 Allergy status to other drugs, medicaments and biological substances status: Secondary | ICD-10-CM

## 2019-03-17 DIAGNOSIS — I9589 Other hypotension: Secondary | ICD-10-CM | POA: Diagnosis present

## 2019-03-17 DIAGNOSIS — I959 Hypotension, unspecified: Secondary | ICD-10-CM | POA: Diagnosis not present

## 2019-03-17 DIAGNOSIS — I5022 Chronic systolic (congestive) heart failure: Secondary | ICD-10-CM | POA: Diagnosis present

## 2019-03-17 DIAGNOSIS — R011 Cardiac murmur, unspecified: Secondary | ICD-10-CM | POA: Diagnosis present

## 2019-03-17 DIAGNOSIS — I252 Old myocardial infarction: Secondary | ICD-10-CM

## 2019-03-17 DIAGNOSIS — E1122 Type 2 diabetes mellitus with diabetic chronic kidney disease: Secondary | ICD-10-CM | POA: Diagnosis present

## 2019-03-17 DIAGNOSIS — Z87891 Personal history of nicotine dependence: Secondary | ICD-10-CM

## 2019-03-17 DIAGNOSIS — Z85528 Personal history of other malignant neoplasm of kidney: Secondary | ICD-10-CM

## 2019-03-17 DIAGNOSIS — Z823 Family history of stroke: Secondary | ICD-10-CM

## 2019-03-17 DIAGNOSIS — Z20822 Contact with and (suspected) exposure to covid-19: Secondary | ICD-10-CM | POA: Diagnosis present

## 2019-03-17 DIAGNOSIS — I953 Hypotension of hemodialysis: Secondary | ICD-10-CM | POA: Diagnosis present

## 2019-03-17 DIAGNOSIS — K219 Gastro-esophageal reflux disease without esophagitis: Secondary | ICD-10-CM | POA: Diagnosis present

## 2019-03-17 DIAGNOSIS — Z8546 Personal history of malignant neoplasm of prostate: Secondary | ICD-10-CM

## 2019-03-17 DIAGNOSIS — I2510 Atherosclerotic heart disease of native coronary artery without angina pectoris: Secondary | ICD-10-CM | POA: Diagnosis present

## 2019-03-17 DIAGNOSIS — H5789 Other specified disorders of eye and adnexa: Secondary | ICD-10-CM | POA: Diagnosis present

## 2019-03-17 DIAGNOSIS — I132 Hypertensive heart and chronic kidney disease with heart failure and with stage 5 chronic kidney disease, or end stage renal disease: Secondary | ICD-10-CM | POA: Diagnosis present

## 2019-03-17 DIAGNOSIS — Z7902 Long term (current) use of antithrombotics/antiplatelets: Secondary | ICD-10-CM

## 2019-03-17 DIAGNOSIS — R55 Syncope and collapse: Secondary | ICD-10-CM | POA: Diagnosis present

## 2019-03-17 DIAGNOSIS — Z79899 Other long term (current) drug therapy: Secondary | ICD-10-CM

## 2019-03-17 DIAGNOSIS — I351 Nonrheumatic aortic (valve) insufficiency: Secondary | ICD-10-CM | POA: Diagnosis not present

## 2019-03-17 DIAGNOSIS — E7800 Pure hypercholesterolemia, unspecified: Secondary | ICD-10-CM | POA: Diagnosis present

## 2019-03-17 DIAGNOSIS — Z833 Family history of diabetes mellitus: Secondary | ICD-10-CM

## 2019-03-17 DIAGNOSIS — E119 Type 2 diabetes mellitus without complications: Secondary | ICD-10-CM

## 2019-03-17 LAB — GLUCOSE, CAPILLARY
Glucose-Capillary: 140 mg/dL — ABNORMAL HIGH (ref 70–99)
Glucose-Capillary: 199 mg/dL — ABNORMAL HIGH (ref 70–99)

## 2019-03-17 LAB — CBC WITH DIFFERENTIAL/PLATELET
Abs Immature Granulocytes: 0.05 10*3/uL (ref 0.00–0.07)
Basophils Absolute: 0.1 10*3/uL (ref 0.0–0.1)
Basophils Relative: 1 %
Eosinophils Absolute: 0.2 10*3/uL (ref 0.0–0.5)
Eosinophils Relative: 2 %
HCT: 24.1 % — ABNORMAL LOW (ref 39.0–52.0)
Hemoglobin: 7.7 g/dL — ABNORMAL LOW (ref 13.0–17.0)
Immature Granulocytes: 0 %
Lymphocytes Relative: 9 %
Lymphs Abs: 1 10*3/uL (ref 0.7–4.0)
MCH: 29.2 pg (ref 26.0–34.0)
MCHC: 32 g/dL (ref 30.0–36.0)
MCV: 91.3 fL (ref 80.0–100.0)
Monocytes Absolute: 1.1 10*3/uL — ABNORMAL HIGH (ref 0.1–1.0)
Monocytes Relative: 9 %
Neutro Abs: 9.3 10*3/uL — ABNORMAL HIGH (ref 1.7–7.7)
Neutrophils Relative %: 79 %
Platelets: 217 10*3/uL (ref 150–400)
RBC: 2.64 MIL/uL — ABNORMAL LOW (ref 4.22–5.81)
RDW: 16.4 % — ABNORMAL HIGH (ref 11.5–15.5)
WBC: 11.7 10*3/uL — ABNORMAL HIGH (ref 4.0–10.5)
nRBC: 0 % (ref 0.0–0.2)

## 2019-03-17 LAB — COMPREHENSIVE METABOLIC PANEL
ALT: 21 U/L (ref 0–44)
AST: 23 U/L (ref 15–41)
Albumin: 3.5 g/dL (ref 3.5–5.0)
Alkaline Phosphatase: 86 U/L (ref 38–126)
Anion gap: 15 (ref 5–15)
BUN: 51 mg/dL — ABNORMAL HIGH (ref 8–23)
CO2: 27 mmol/L (ref 22–32)
Calcium: 8.8 mg/dL — ABNORMAL LOW (ref 8.9–10.3)
Chloride: 91 mmol/L — ABNORMAL LOW (ref 98–111)
Creatinine, Ser: 7.36 mg/dL — ABNORMAL HIGH (ref 0.61–1.24)
GFR calc Af Amer: 7 mL/min — ABNORMAL LOW (ref >60)
GFR calc non Af Amer: 6 mL/min — ABNORMAL LOW (ref >60)
Glucose, Bld: 60 mg/dL — ABNORMAL LOW (ref 70–99)
Potassium: 4.1 mmol/L (ref 3.5–5.1)
Sodium: 133 mmol/L — ABNORMAL LOW (ref 135–145)
Total Bilirubin: 0.4 mg/dL (ref 0.3–1.2)
Total Protein: 7.4 g/dL (ref 6.5–8.1)

## 2019-03-17 LAB — MAGNESIUM: Magnesium: 2.2 mg/dL (ref 1.7–2.4)

## 2019-03-17 LAB — TROPONIN I (HIGH SENSITIVITY)
Troponin I (High Sensitivity): 19 ng/L — ABNORMAL HIGH (ref <18)
Troponin I (High Sensitivity): 35 ng/L — ABNORMAL HIGH (ref <18)

## 2019-03-17 LAB — HEMOGLOBIN A1C
Hgb A1c MFr Bld: 7.1 % — ABNORMAL HIGH (ref 4.8–5.6)
Mean Plasma Glucose: 157.07 mg/dL

## 2019-03-17 IMAGING — DX DG CHEST 1V PORT
1 series · 1 of 1 positions shown · non-contrast
Comparison: Chest radiograph dated [DATE]

CLINICAL DATA: Shortness of breath

EXAM:
PORTABLE CHEST 1 VIEW

[chest ap]
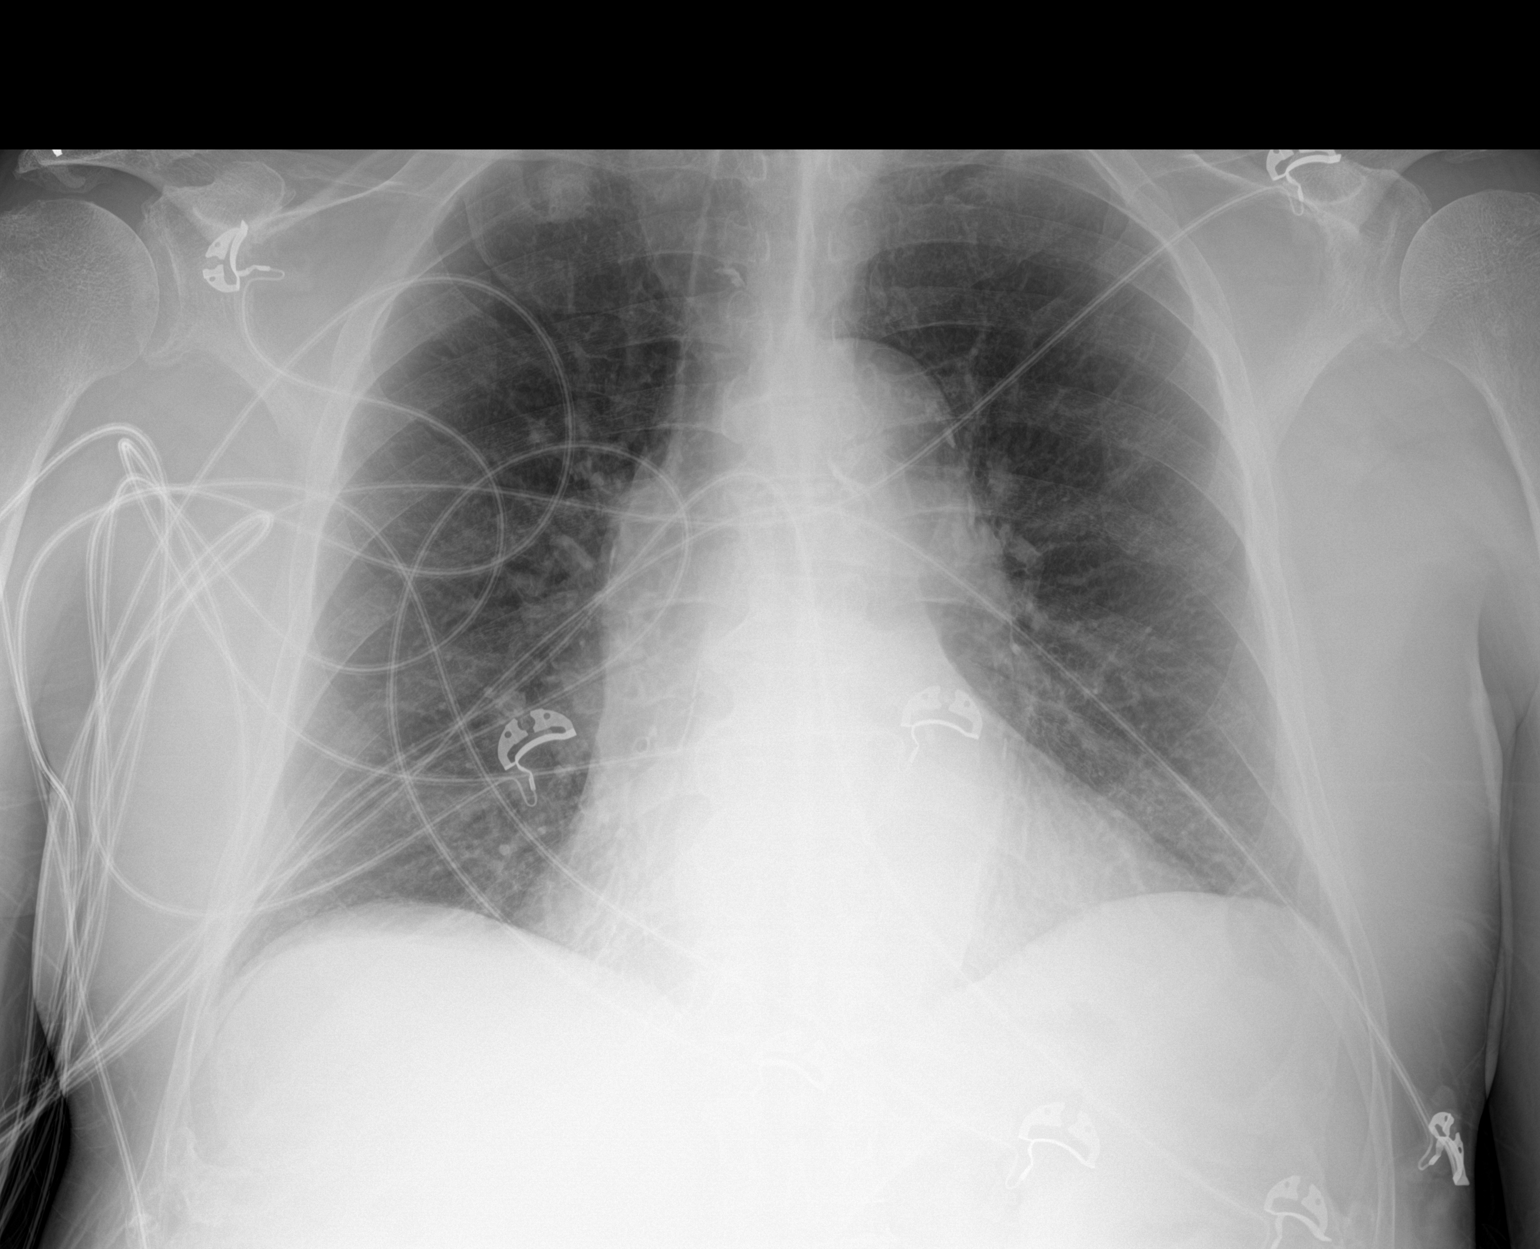

[1 of 1 positions shown; findings below may reference images not displayed]

FINDINGS: The heart is borderline enlarged. Vascular calcifications are seen
in the aortic arch. The lungs are clear. The osseous structures are
intact.
IMPRESSION: Borderline cardiomegaly.  No acute pulmonary process.

## 2019-03-17 MED ORDER — METOPROLOL SUCCINATE ER 25 MG PO TB24: 12.5000 mg | ORAL_TABLET | ORAL | Status: DC

## 2019-03-17 MED ORDER — ATORVASTATIN CALCIUM 80 MG PO TABS
80.0000 mg | ORAL_TABLET | Freq: Every day | ORAL | Status: DC
  Administered 2019-03-18 – 2019-03-19 (×2): 80 mg via ORAL
  Filled 2019-03-17 (×2): qty 1

## 2019-03-17 MED ORDER — ACETAMINOPHEN 650 MG RE SUPP: 650.0000 mg | Freq: Four times a day (QID) | RECTAL | Status: DC | PRN

## 2019-03-17 MED ORDER — CINACALCET HCL 30 MG PO TABS
30.0000 mg | ORAL_TABLET | ORAL | Status: DC
  Administered 2019-03-18 – 2019-03-20 (×2): 30 mg via ORAL
  Filled 2019-03-17 (×2): qty 1

## 2019-03-17 MED ORDER — SODIUM CHLORIDE 0.9% FLUSH: 3.0000 mL | INTRAVENOUS | Status: DC | PRN

## 2019-03-17 MED ORDER — PANTOPRAZOLE SODIUM 40 MG PO TBEC
40.0000 mg | DELAYED_RELEASE_TABLET | Freq: Two times a day (BID) | ORAL | Status: DC
  Administered 2019-03-17 – 2019-03-20 (×6): 40 mg via ORAL
  Filled 2019-03-17 (×6): qty 1

## 2019-03-17 MED ORDER — SODIUM CHLORIDE 0.9% FLUSH
3.0000 mL | Freq: Two times a day (BID) | INTRAVENOUS | Status: DC
  Administered 2019-03-18 – 2019-03-20 (×3): 3 mL via INTRAVENOUS

## 2019-03-17 MED ORDER — INSULIN ASPART 100 UNIT/ML ~~LOC~~ SOLN
0.0000 [IU] | Freq: Three times a day (TID) | SUBCUTANEOUS | Status: DC
  Administered 2019-03-18: 11 [IU] via SUBCUTANEOUS
  Administered 2019-03-18: 3 [IU] via SUBCUTANEOUS

## 2019-03-17 MED ORDER — CINACALCET HCL 30 MG PO TABS
60.0000 mg | ORAL_TABLET | ORAL | Status: DC
  Filled 2019-03-17 (×2): qty 2

## 2019-03-17 MED ORDER — POLYETHYLENE GLYCOL 3350 17 G PO PACK
17.0000 g | PACK | Freq: Two times a day (BID) | ORAL | Status: DC
  Filled 2019-03-17 (×3): qty 1

## 2019-03-17 MED ORDER — SODIUM CHLORIDE 0.9% FLUSH
3.0000 mL | Freq: Two times a day (BID) | INTRAVENOUS | Status: DC
  Administered 2019-03-17 – 2019-03-20 (×3): 3 mL via INTRAVENOUS

## 2019-03-17 MED ORDER — NITROGLYCERIN 0.4 MG SL SUBL
0.4000 mg | SUBLINGUAL_TABLET | SUBLINGUAL | Status: DC | PRN
  Administered 2019-03-18: 0.4 mg via SUBLINGUAL
  Filled 2019-03-17: qty 1

## 2019-03-17 MED ORDER — METOPROLOL TARTRATE 25 MG PO TABS
25.0000 mg | ORAL_TABLET | ORAL | Status: DC
  Administered 2019-03-18: 10:00:00 25 mg via ORAL
  Filled 2019-03-17: qty 1

## 2019-03-17 MED ORDER — CLOPIDOGREL BISULFATE 75 MG PO TABS
75.0000 mg | ORAL_TABLET | Freq: Every day | ORAL | Status: DC
  Administered 2019-03-18 – 2019-03-20 (×3): 75 mg via ORAL
  Filled 2019-03-17 (×3): qty 1

## 2019-03-17 MED ORDER — METOPROLOL SUCCINATE ER 25 MG PO TB24: 25.0000 mg | ORAL_TABLET | ORAL | Status: DC

## 2019-03-17 MED ORDER — CINACALCET HCL 30 MG PO TABS
30.0000 mg | ORAL_TABLET | Freq: Every day | ORAL | Status: DC
  Filled 2019-03-17: qty 1

## 2019-03-17 MED ORDER — ACETAMINOPHEN 325 MG PO TABS: 650.0000 mg | ORAL_TABLET | Freq: Four times a day (QID) | ORAL | Status: DC | PRN

## 2019-03-17 MED ORDER — SEVELAMER CARBONATE 800 MG PO TABS: 1600.0000 mg | ORAL_TABLET | Freq: Three times a day (TID) | ORAL | Status: DC

## 2019-03-17 MED ORDER — SODIUM CHLORIDE 0.9 % IV SOLN: 250.0000 mL | INTRAVENOUS | Status: DC | PRN

## 2019-03-17 MED ORDER — MORPHINE SULFATE (PF) 4 MG/ML IV SOLN
4.0000 mg | Freq: Once | INTRAVENOUS | Status: AC
  Administered 2019-03-17: 4 mg via INTRAVENOUS
  Filled 2019-03-17: qty 1

## 2019-03-17 NOTE — ED Triage Notes (Signed)
Pt states that he was at home about any hour ago and passed out. His wife took his blood pressure and it was low  - pt is currently a/o x 4 but has low BP

## 2019-03-17 NOTE — ED Notes (Signed)
ED Provider at bedside. 

## 2019-03-17 NOTE — ED Provider Notes (Signed)
[place] EMERGENCY DEPARTMENT Provider Note   CSN: [id number] Arrival date & time: 03/17/19  1234     History Chief Complaint  Patient presents with  . Loss of Consciousness    Nicholas [name] is a 86 y.o. male with a pertinent PMH of HTN, DM, ESRD (TTS), HFrEF (EF of 30-35%), MI s/p angiography with stents on 07/2018, who presents today after an unwitnessed syncopal episode. Patient states that he was sitting at his kitchen table drinking coffee when he passed out for a few seconds spilling coffee over the table and himself. He denies any symptoms leading up to this episode, but states he may of had change in vision right before the syncopal event. He denies postictal symptoms or history of seizures. His wife took his blood pressure immediately after this episode which was low.   On presentation to the ED the patient was tachycardic and hypotensive at 70's/40's. He does have a significant history of CV disease including ischemic CHF without a history of aortic stenosis. He had dialysis yesterday, which he was able to complete without complication.  HPI     Past Medical History:  Diagnosis Date  . Arthritis   . Chronic kidney disease   . Diabetes mellitus ([place])   . Dialysis patient ([place])   . GERD (gastroesophageal reflux disease)    occ  . Heart murmur   . Hypercholesteremia   . Hypertension   . Psoriasis     Patient Active Problem List   Diagnosis Date Noted  . Syncope and collapse 03/17/2019  . Symptomatic anemia 09/28/2018  . GIB (gastrointestinal bleeding) 09/27/2018  . Anemia due to blood loss 09/27/2018  . Type II diabetes mellitus with renal manifestations ([place]) 09/27/2018  . GERD (gastroesophageal reflux disease) 09/27/2018  . CAD (coronary artery disease) 09/27/2018  . Chronic systolic CHF (congestive heart failure) ([place]) 09/27/2018  . NSTEMI (non-ST elevated myocardial infarction) ([place]) 08/06/2018  . Diabetes mellitus ([place])   . Hypertension   .  Hypercholesteremia   . ESRD (end stage renal disease) on dialysis ([place])   . Anemia of chronic disease   . Lung nodule seen on imaging study 04/30/2012    Past Surgical History:  Procedure Laterality Date  . CARDIAC CATHETERIZATION  09   "some blockage" with collaterals by cath at [facility] ~ 2009; no intervention required; reportedly, no routine cardiology f/u recommended   . CORONARY ATHERECTOMY N/A 08/09/2018   Procedure: CORONARY ATHERECTOMY;  Surgeon: Jettie Booze, MD;  Location: [place] [facility];  Service: Cardiovascular;  Laterality: N/A;  . CORONARY STENT INTERVENTION N/A 08/09/2018   Procedure: CORONARY STENT INTERVENTION;  Surgeon: Jettie Booze, MD;  Location: [place] [facility];  Service: Cardiovascular;  Laterality: N/A;  . INGUINAL HERNIA REPAIR Right 04/13/2012   Procedure: HERNIA REPAIR INGUINAL INCARCERATED;  Surgeon: Gayland Curry, MD;  Location: [place];  Service: General;  Laterality: Right;  . INSERTION OF MESH Right 04/13/2012   Procedure: INSERTION OF MESH;  Surgeon: Gayland Curry, MD;  Location: [place];  Service: General;  Laterality: Right;  . LEFT HEART CATH N/A 08/09/2018   Procedure: Left Heart Cath;  Surgeon: Jettie Booze, MD;  Location: [place] [facility];  Service: Cardiovascular;  Laterality: N/A;  . LEFT HEART CATH AND CORONARY ANGIOGRAPHY N/A 08/08/2018   Procedure: LEFT HEART CATH AND CORONARY ANGIOGRAPHY;  Surgeon: Troy Sine, MD;  Location: [place] [facility];  Service: Cardiovascular;  Laterality: N/A;  . VASECTOMY  Family History  Problem Relation Age of Onset  . Diabetes Mother   . Stroke Father   . Diabetes Sister   . Diabetes Brother     Social History   Tobacco Use  . Smoking status: Former Smoker    Packs/day: 1.00    Years: 15.00    Pack years: 15.00    Types: Cigarettes    Quit date: 03/29/1987    Years since quitting: 31.9  . Smokeless tobacco: Never Used  Substance Use Topics  . Alcohol use: No  . Drug  use: No    Home Medications Prior to Admission medications   Medication Sig Start Date End Date Taking? Authorizing Provider  atorvastatin (LIPITOR) 80 MG tablet Take 1 tablet (80 mg total) by mouth daily at 6 PM. 08/10/18   [name], MD  cinacalcet (SENSIPAR) 30 MG tablet Take 30 mg by mouth daily.    [provider]  clopidogrel (PLAVIX) 75 MG tablet Take 1 tablet (75 mg total) by mouth daily with breakfast. 08/11/18   [name], MD  metoprolol succinate (TOPROL-XL) 25 MG 24 hr tablet Take 1 tablet (25 mg total) by mouth See admin instructions. Take 1/2 tablet (25 mg) on non dialysis days -  Sunday, Monday, Wednesday, Friday. Take with or immediately following a meal. 08/10/18   [name], MD  nitroGLYCERIN (NITROSTAT) 0.4 MG SL tablet Place 1 tablet (0.4 mg total) under the tongue every 5 (five) minutes x 3 doses as needed for chest pain. 08/10/18   [name], MD  pantoprazole (PROTONIX) 40 MG tablet Take 1 tablet (40 mg total) by mouth 2 (two) times daily. 10/04/18   Regalado, Belkys A, MD  polyethylene glycol (MIRALAX / GLYCOLAX) 17 g packet Take 17 g by mouth 2 (two) times daily. 10/04/18   Regalado, Belkys A, MD  PROTEIN PO Take 237 mLs by mouth daily.    [provider]  sevelamer carbonate (RENVELA) 800 MG tablet Take 1,600 mg by mouth 3 (three) times daily with meals.  05/09/18   [provider]    Allergies    Hydrochlorothiazide  Review of Systems   Review of Systems  Physical Exam Updated Vital Signs BP (!) 118/59   Pulse 100   Temp 98.7 F (37.1 C) (Oral)   Resp 16   Ht 5\' 7"  (1.702 m)   Wt 73.9 kg   SpO2 95%   BMI 25.53 kg/m   Physical Exam Constitutional:      Appearance: Normal appearance.  HENT:     Head: Normocephalic and atraumatic.  Eyes:     Extraocular Movements: Extraocular movements intact.  Cardiovascular:     Rate and Rhythm: Normal rate and regular rhythm.     Heart sounds: Murmur  present.  Pulmonary:     Effort: Respiratory distress present.  Chest:     Chest wall: No tenderness.  Abdominal:     General: Bowel sounds are normal. There is no distension.     Tenderness: There is no abdominal tenderness.  Musculoskeletal:        General: No swelling or tenderness. Normal range of motion.     Cervical back: Normal range of motion.  Skin:    General: Skin is warm and dry.  Neurological:     General: No focal deficit present.     Mental Status: He is alert and oriented to person, place, and time.  Psychiatric:        Mood and Affect: Mood normal.  Behavior: Behavior normal.     ED Results / Procedures / Treatments   Labs (all labs ordered are listed, but only abnormal results are displayed) Labs Reviewed  CBC WITH DIFFERENTIAL/PLATELET - Abnormal; Notable for the following components:      Result Value   WBC 11.7 (*)    RBC 2.64 (*)    Hemoglobin 7.7 (*)    HCT 24.1 (*)    RDW 16.4 (*)    Neutro Abs 9.3 (*)    Monocytes Absolute 1.1 (*)    All other components within normal limits  COMPREHENSIVE METABOLIC PANEL - Abnormal; Notable for the following components:   Sodium 133 (*)    Chloride 91 (*)    Glucose, Bld 60 (*)    BUN 51 (*)    Creatinine, Ser 7.36 (*)    Calcium 8.8 (*)    GFR calc non Af Amer 6 (*)    GFR calc Af Amer 7 (*)    All other components within normal limits  TROPONIN I (HIGH SENSITIVITY) - Abnormal; Notable for the following components:   Troponin I (High Sensitivity) 19 (*)    All other components within normal limits  SARS CORONAVIRUS 2 (TAT 6-24 HRS)  MAGNESIUM    EKG EKG Interpretation  Date/Time:  Sunday March 17 2019 13:12:44 EST Ventricular Rate:  106 PR Interval:    QRS Duration: 102 QT Interval:  354 QTC Calculation: 471 R Axis:   -35 Text Interpretation: Sinus tachycardia Left axis deviation Probable anterior infarct, age indeterminate Lateral leads are also involved No significant change since  last tracing Confirmed by Deno Etienne [phone]) on 03/17/2019 2:15:00 PM   Radiology DG Chest Portable 1 View  Result Date: 03/17/2019 CLINICAL DATA:  Shortness of breath EXAM: PORTABLE CHEST 1 VIEW COMPARISON:  Chest radiograph dated 08/06/2018 FINDINGS: The heart is borderline enlarged. Vascular calcifications are seen in the aortic arch. The lungs are clear. The osseous structures are intact. IMPRESSION: Borderline cardiomegaly.  No acute pulmonary process. Electronically Signed   By: Zerita Boers M.D.   On: 03/17/2019 14:02    Procedures Procedures (including critical care time)  Medications Ordered in ED Medications - No data to display  ED Course  I have reviewed the triage vital signs and the nursing notes.  Pertinent labs & imaging results that were available during my care of the patient were reviewed by me and considered in my medical decision making (see chart for details).    MDM Rules/Calculators/A&P                       Final Clinical Impression(s) / ED Diagnoses Final diagnoses:  Syncope, unspecified syncope type   Syncope: Patient presents with a single episode of syncope while sitting down and without prodromal symptoms. He does have a significant history of structural heart disease concerning for cardiogenic etiology of his syncope. Basc labs were check and show no signs of electrolyte abnormalities. Patient will be admitted for further evaluation and management. Basic labs check  - Admit patient for further   Rx / DC Orders ED Discharge Orders    None       Marianna Payment, MD 03/17/19 [place], [place], DO 03/17/19 1540

## 2019-03-17 NOTE — Progress Notes (Signed)
Troponin of 35 up from 19.  APP notified.  Asymptomatic.

## 2019-03-17 NOTE — ED Notes (Signed)
Report given to Shannon RN with [facility] 

## 2019-03-17 NOTE — H&P (Signed)
History and Physical    Nicholas [name] GLO:[id number] DOB: 1930-10-04 DOA: 03/17/2019  PCP: [facility], [facility] (Confirm with patient/family/NH records and if not entered, this has to be entered at [facility] point of entry) Patient coming from: [facility], from home prior to transfer  I have personally briefly reviewed patient's old medical records in [place]  Chief Complaint: syncope  HPI: Nicholas [name] is a 86 y.o. male with medical history significant of HTN, DM, ESRD (TTS), HFrEF (EF of 30-35%), MI s/p angiography revealing severe multi-vessel disease  with stent proximal LAD on 07/2018, who presents today after an unwitnessed syncopal episode. Patient states that he was sitting at his kitchen table drinking coffee when he passed out for a few seconds spilling coffee over the table and himself. He denies any symptoms leading up to this episode, but states he had a very brief change in vision right before the syncopal event. He denies postictal symptoms or history of seizures. His wife took his blood pressure immediately after this episode which was in the low 90's.  (For level 3, the HPI must include 4+ descriptors: Location, Quality, Severity, Duration, Timing, Context, modifying factors, associated signs/symptoms and/or status of 3+ chronic problems.)  (Please avoid self-populating past medical history here) (The initial 2-3 lines should be focused and good to copy and paste in the HPI section of the daily progress note).  ED Course: On presentation to the ED the patient was tachycardic and hypotensive at 70's/40's. He does have a significant history of CV disease including ischemic CHF without a history of aortic stenosis. He had dialysis yesterday, which he was able to complete without complication. BP improved w/o intervention. Lab revealed Hgb 7.7, Troponin 19. EKG w/o acute STEMI changes. Per [facility] MD cardiology recommended tele observation to r/o arrhythmogenic origin of syncope.  Review of  Systems: As per HPI otherwise 10 point review of systems negative.  Unacceptable ROS statements: "10 systems reviewed," "Extensive" (without elaboration).  Acceptable ROS statements: "All others negative," "All others reviewed and are negative," and "All others unremarkable," with at [place] documented Can't double dip - if using for HPI can't use for ROS  Past Medical History:  Diagnosis Date  . Arthritis   . Chronic kidney disease   . Diabetes mellitus ([place])   . Dialysis patient ([place])   . GERD (gastroesophageal reflux disease)    occ  . Heart murmur   . Hypercholesteremia   . Hypertension   . Psoriasis     Past Surgical History:  Procedure Laterality Date  . CARDIAC CATHETERIZATION  09   "some blockage" with collaterals by cath at [facility] ~ 2009; no intervention required; reportedly, no routine cardiology f/u recommended   . CORONARY ATHERECTOMY N/A 08/09/2018   Procedure: CORONARY ATHERECTOMY;  Surgeon: Jettie Booze, MD;  Location: [place] CV LAB;  Service: Cardiovascular;  Laterality: N/A;  . CORONARY STENT INTERVENTION N/A 08/09/2018   Procedure: CORONARY STENT INTERVENTION;  Surgeon: Jettie Booze, MD;  Location: [place] CV LAB;  Service: Cardiovascular;  Laterality: N/A;  . INGUINAL HERNIA REPAIR Right 04/13/2012   Procedure: HERNIA REPAIR INGUINAL INCARCERATED;  Surgeon: Gayland Curry, MD;  Location: [place];  Service: General;  Laterality: Right;  . INSERTION OF MESH Right 04/13/2012   Procedure: INSERTION OF MESH;  Surgeon: Gayland Curry, MD;  Location: [place];  Service: General;  Laterality: Right;  . LEFT HEART CATH N/A 08/09/2018   Procedure: Left Heart Cath;  Surgeon:  Jettie Booze, MD;  Location: [place] CV LAB;  Service: Cardiovascular;  Laterality: N/A;  . LEFT HEART CATH AND CORONARY ANGIOGRAPHY N/A 08/08/2018   Procedure: LEFT HEART CATH AND CORONARY ANGIOGRAPHY;  Surgeon: Troy Sine, MD;  Location: [place] CV LAB;  Service:  Cardiovascular;  Laterality: N/A;  . VASECTOMY     Soc Hx - married 63 years. Raised 6 children: 4 biologic children and 2 children of his deceased sister. He has more grandchildren then fingers and toes. 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 He is now retired. He lives with his wife.    reports that he quit smoking about 31 years ago. His smoking use included cigarettes. He has a 15.00 pack-year smoking history. He has never used smokeless tobacco. He reports that he does not drink alcohol or use drugs.  Allergies  Allergen Reactions  . Hydrochlorothiazide Other (See Comments)    [facility]    Family History  Problem Relation Age of Onset  . Diabetes Mother   . Stroke Father   . Diabetes Sister   . Diabetes Brother    Unacceptable: Noncontributory, unremarkable, or negative. Acceptable: Family history reviewed and not pertinent (If you reviewed it)  Prior to Admission medications   Medication Sig Start Date End Date Taking? Authorizing Provider  atorvastatin (LIPITOR) 80 MG tablet Take 1 tablet (80 mg total) by mouth daily at 6 PM. 08/10/18   [name], MD  cinacalcet (SENSIPAR) 30 MG tablet Take 30 mg by mouth daily.    [provider]  clopidogrel (PLAVIX) 75 MG tablet Take 1 tablet (75 mg total) by mouth daily with breakfast. 08/11/18   [name], MD  metoprolol succinate (TOPROL-XL) 25 MG 24 hr tablet Take 1 tablet (25 mg total) by mouth See admin instructions. Take 1/2 tablet (25 mg) on non dialysis days -  Sunday, Monday, Wednesday, Friday. Take with or immediately following a meal. 08/10/18   [name], MD  nitroGLYCERIN (NITROSTAT) 0.4 MG SL tablet Place 1 tablet (0.4 mg total) under the tongue every 5 (five) minutes x 3 doses as needed for chest pain. 08/10/18   [name], MD  pantoprazole (PROTONIX) 40 MG tablet Take 1 tablet  (40 mg total) by mouth 2 (two) times daily. 10/04/18   Regalado, Belkys A, MD  polyethylene glycol (MIRALAX / GLYCOLAX) 17 g packet Take 17 g by mouth 2 (two) times daily. 10/04/18   Regalado, Belkys A, MD  PROTEIN PO Take 237 mLs by mouth daily.    [provider]  sevelamer carbonate (RENVELA) 800 MG tablet Take 1,600 mg by mouth 3 (three) times daily with meals.  05/09/18   [provider]    Physical Exam: Vitals:   03/17/19 1530 03/17/19 1600 03/17/19 1630 03/17/19 1715  BP: (!) 113/58 119/60 126/78 124/67  Pulse: 100 (!) 107  (!) 109  Resp: 13 17  20   Temp:    98.1 F (36.7 C)  TempSrc:    Oral  SpO2: 100% 94%  97%  Weight:    74.2 kg  Height:    5\' 7"  (1.702 m)    Constitutional: NAD, calm, comfortable Vitals:   03/17/19 1530 03/17/19 1600 03/17/19 1630 03/17/19 1715  BP: (!) 113/58 119/60 126/78 124/67  Pulse: 100 (!) 107  (!) 109  Resp: 13 17  20   Temp:  98.1 F (36.7 C)  TempSrc:    Oral  SpO2: 100% 94%  97%  Weight:    74.2 kg  Height:    5\' 7"  (1.702 m)   General" WNWD man in no distress Eyes: PERRL chronic corneal rings bilaterally, lids and conjunctivae normal ENMT: Mucous membranes are moist. Posterior pharynx clear of any exudate or lesions.Edentulous.  Neck: normal, supple, no masses, no thyromegaly Respiratory: clear to auscultation bilaterally, no wheezing, no crackles. Normal respiratory effort. No accessory muscle use.  Cardiovascular: Regular rate and rhythm, no murmurs / rubs / gallops. 1+ extremity edema. 1+ pedal pulses. No carotid bruits.  Abdomen: no tenderness, no masses palpated. No hepatosplenomegaly. Bowel sounds positive.  Musculoskeletal: no clubbing / cyanosis. No joint deformity upper and lower extremities. Good ROM, no contractures. Normal muscle tone.  Skin: no rashes, lesions, ulcers. No induration Neurologic: CN 2-12 grossly intact. Sensation intact, DTR normal. Strength 5/5 in all 4.  Psychiatric: Normal judgment and  insight. Alert and oriented x 3. Normal mood.   (Anything < 9 systems with 2 bullets each down codes to level 1) (If patient refuses exam can't bill higher level) (Make sure to document decubitus ulcers present on admission -- if possible -- and whether patient has chronic indwelling catheter at time of admission)  Labs on Admission: I have personally reviewed following labs and imaging studies  CBC: Recent Labs  Lab 03/17/19 1347  WBC 11.7*  NEUTROABS 9.3*  HGB 7.7*  HCT 24.1*  MCV 91.3  PLT 185   Basic Metabolic Panel: Recent Labs  Lab 03/17/19 1347  NA 133*  K 4.1  CL 91*  CO2 27  GLUCOSE 60*  BUN 51*  CREATININE 7.36*  CALCIUM 8.8*  MG 2.2   GFR: Estimated Creatinine Clearance: 6.7 mL/min (A) (by C-G formula based on SCr of 7.36 mg/dL (H)). Liver Function Tests: Recent Labs  Lab 03/17/19 1347  AST 23  ALT 21  ALKPHOS 86  BILITOT 0.4  PROT 7.4  ALBUMIN 3.5   No results for input(s): LIPASE, AMYLASE in the last 168 hours. No results for input(s): AMMONIA in the last 168 hours. Coagulation Profile: No results for input(s): INR, PROTIME in the last 168 hours. Cardiac Enzymes: No results for input(s): CKTOTAL, CKMB, CKMBINDEX, TROPONINI in the last 168 hours. BNP (last 3 results) No results for input(s): PROBNP in the last 8760 hours. HbA1C: No results for input(s): HGBA1C in the last 72 hours. CBG: No results for input(s): GLUCAP in the last 168 hours. Lipid Profile: No results for input(s): CHOL, HDL, LDLCALC, TRIG, CHOLHDL, LDLDIRECT in the last 72 hours. Thyroid Function Tests: No results for input(s): TSH, T4TOTAL, FREET4, T3FREE, THYROIDAB in the last 72 hours. Anemia Panel: No results for input(s): VITAMINB12, FOLATE, FERRITIN, TIBC, IRON, RETICCTPCT in the last 72 hours. Urine analysis: No results found for: COLORURINE, APPEARANCEUR, [place], [place], GLUCOSEU, HGBUR, BILIRUBINUR, KETONESUR, PROTEINUR, UROBILINOGEN, NITRITE,  LEUKOCYTESUR  Radiological Exams on Admission: DG Chest Portable 1 View  Result Date: 03/17/2019 CLINICAL DATA:  Shortness of breath EXAM: PORTABLE CHEST 1 VIEW COMPARISON:  Chest radiograph dated 08/06/2018 FINDINGS: The heart is borderline enlarged. Vascular calcifications are seen in the aortic arch. The lungs are clear. The osseous structures are intact. IMPRESSION: Borderline cardiomegaly.  No acute pulmonary process. Electronically Signed   By: Zerita Boers M.D.   On: 03/17/2019 14:02    EKG: Independently reviewed. Sinus tachycardia, left axis deviation, no acute changes  Assessment/Plan Active Problems:   Syncope  and collapse   Diabetes mellitus ([place])   Hypertension   ESRD (end stage renal disease) on dialysis ([place])   Anemia of chronic disease  (please populate well all problems here in Problem List. (For example, if patient is on BP meds at home and you resume or decide to hold them, it is a problem that needs to be her. Same for CAD, COPD, HLD and so on)   1. Syncope - patient with syncope of sudden onset w/o prodrome. No injury. LOC was brief and he was fully awake in seconds. No chest pain. He did have brief fleeting darkening of vision. He has LVEF 35-40% by echo 08/07/18 with LV hypokinesis, miold to moderate MV regurg. He has several other episodes of sudden syncope Plan Tele to r/o arrhythmogenic syncope  Complete cycling troponins  EP consult to be called in AM  2. Anemia - patient having ongoing work up for anemia in addition to chronic anemia related to ESRD. Last hospitalization he required multiple units of blood with a d/c Hgb 8.4. He report last H/H at dialysis was 9+ and now is back down to 7.7. He denies dark stools or any sign of active bleed. He is for capsule endoscopy March 6th.  Plan F/u H/H in AM: for continued drop he will need transfusion. Type and hold ordered  3. ESRD - HD on TTSa. At [facility]. If here beyond Monday will need HD  4. DM - takes sliding scale  at home with 70/30 at bedtime Plan Ss coverage.  5. HTN - had transient post-syncope hypotension that corrected w/o intervention.  Plan  Continue home meds.  DVT prophylaxis: continue plavix (Lovenox/Heparin/SCD's/anticoagulated/None (if comfort care) Code Status: full code (Full/Partial (specify details) Family Communication: spoke with wife, who reported the multiple syncopal episodes (Specify name, relationship. Do not write "discussed with patient". Specify tel # if discussed over the phone) Disposition Plan: home when stable (specify when and where you expect patient to be discharged) Consults called: Cardiology/EP - to be called in AM (with names) Admission status: inpatient/tele (inpatient / obs / tele / medical floor / SDU)   Adella Hare MD [facility] Pager [phone]  If 7PM-7AM, please contact night-coverage [email or web address] Password [facility]  03/17/2019, 6:12 PM

## 2019-03-17 NOTE — ED Notes (Signed)
Report given to receiving nurse at [facility], [place]

## 2019-03-18 ENCOUNTER — Inpatient Hospital Stay (HOSPITAL_COMMUNITY): Payer: Medicare Other

## 2019-03-18 DIAGNOSIS — I25118 Atherosclerotic heart disease of native coronary artery with other forms of angina pectoris: Secondary | ICD-10-CM

## 2019-03-18 DIAGNOSIS — I351 Nonrheumatic aortic (valve) insufficiency: Secondary | ICD-10-CM

## 2019-03-18 DIAGNOSIS — I959 Hypotension, unspecified: Secondary | ICD-10-CM

## 2019-03-18 LAB — CBC
HCT: 22.2 % — ABNORMAL LOW (ref 39.0–52.0)
Hemoglobin: 7.2 g/dL — ABNORMAL LOW (ref 13.0–17.0)
MCH: 29.5 pg (ref 26.0–34.0)
MCHC: 32.4 g/dL (ref 30.0–36.0)
MCV: 91 fL (ref 80.0–100.0)
Platelets: 217 10*3/uL (ref 150–400)
RBC: 2.44 MIL/uL — ABNORMAL LOW (ref 4.22–5.81)
RDW: 16 % — ABNORMAL HIGH (ref 11.5–15.5)
WBC: 8.7 10*3/uL (ref 4.0–10.5)
nRBC: 0 % (ref 0.0–0.2)

## 2019-03-18 LAB — ECHOCARDIOGRAM COMPLETE
Height: 67 in
Weight: 2584 oz

## 2019-03-18 LAB — HEMOGLOBIN AND HEMATOCRIT, BLOOD
HCT: 24.4 % — ABNORMAL LOW (ref 39.0–52.0)
Hemoglobin: 8.4 g/dL — ABNORMAL LOW (ref 13.0–17.0)

## 2019-03-18 LAB — RENAL FUNCTION PANEL
Albumin: 3.1 g/dL — ABNORMAL LOW (ref 3.5–5.0)
Anion gap: 15 (ref 5–15)
BUN: 57 mg/dL — ABNORMAL HIGH (ref 8–23)
CO2: 28 mmol/L (ref 22–32)
Calcium: 8.7 mg/dL — ABNORMAL LOW (ref 8.9–10.3)
Chloride: 95 mmol/L — ABNORMAL LOW (ref 98–111)
Creatinine, Ser: 9.11 mg/dL — ABNORMAL HIGH (ref 0.61–1.24)
GFR calc Af Amer: 5 mL/min — ABNORMAL LOW (ref >60)
GFR calc non Af Amer: 5 mL/min — ABNORMAL LOW (ref >60)
Glucose, Bld: 159 mg/dL — ABNORMAL HIGH (ref 70–99)
Phosphorus: 6.8 mg/dL — ABNORMAL HIGH (ref 2.5–4.6)
Potassium: 4.8 mmol/L (ref 3.5–5.1)
Sodium: 138 mmol/L (ref 135–145)

## 2019-03-18 LAB — GLUCOSE, CAPILLARY
Glucose-Capillary: 157 mg/dL — ABNORMAL HIGH (ref 70–99)
Glucose-Capillary: 234 mg/dL — ABNORMAL HIGH (ref 70–99)
Glucose-Capillary: 62 mg/dL — ABNORMAL LOW (ref 70–99)
Glucose-Capillary: 81 mg/dL (ref 70–99)
Glucose-Capillary: 90 mg/dL (ref 70–99)
Glucose-Capillary: 208 mg/dL — ABNORMAL HIGH (ref 70–99)

## 2019-03-18 LAB — MRSA PCR SCREENING: MRSA by PCR: NEGATIVE

## 2019-03-18 LAB — TROPONIN I (HIGH SENSITIVITY)
Troponin I (High Sensitivity): 96 ng/L — ABNORMAL HIGH (ref <18)
Troponin I (High Sensitivity): 90 ng/L — ABNORMAL HIGH (ref <18)

## 2019-03-18 LAB — PROTIME-INR
INR: 1 (ref 0.8–1.2)
Prothrombin Time: 13.6 seconds (ref 11.4–15.2)

## 2019-03-18 LAB — PREPARE RBC (CROSSMATCH)

## 2019-03-18 LAB — SARS CORONAVIRUS 2 (TAT 6-24 HRS): SARS Coronavirus 2: NEGATIVE

## 2019-03-18 MED ORDER — SODIUM CHLORIDE 0.9% IV SOLUTION: Freq: Once | INTRAVENOUS | Status: AC

## 2019-03-18 MED ORDER — DOXERCALCIFEROL 0.5 MCG PO CAPS
2.0000 ug | ORAL_CAPSULE | ORAL | Status: DC
  Filled 2019-03-18: qty 4

## 2019-03-18 MED ORDER — CHLORHEXIDINE GLUCONATE CLOTH 2 % EX PADS: 6.0000 | MEDICATED_PAD | Freq: Every day | CUTANEOUS | Status: DC

## 2019-03-18 MED ORDER — INSULIN ASPART 100 UNIT/ML ~~LOC~~ SOLN
0.0000 [IU] | Freq: Every day | SUBCUTANEOUS | Status: DC
  Administered 2019-03-18: 2 [IU] via SUBCUTANEOUS

## 2019-03-18 MED ORDER — INSULIN ASPART 100 UNIT/ML ~~LOC~~ SOLN: 0.0000 [IU] | Freq: Three times a day (TID) | SUBCUTANEOUS | Status: DC

## 2019-03-18 NOTE — Progress Notes (Signed)
Patient states the pain is gone and he does not need nitroglycerin. Will continue to monitor patient.

## 2019-03-18 NOTE — Progress Notes (Signed)
Renal Navigator notified [place] Worker/J. Mischler of patient's admission and requested that HD orders be faxed to acute unit to provide continuity of care.  Alphonzo Cruise, [place] Renal Navigator  [phone]

## 2019-03-18 NOTE — Consult Note (Addendum)
Cardiology Consultation:   Patient ID: Nicholas [name] MRN: [id number]; DOB: 1930/03/07  Admit date: 03/17/2019 Date of Consult: 03/18/2019  Primary Care Provider: [facility], [facility] Primary Cardiologist: Sinclair Grooms, MD (previoulsy followed by Dr. Roseanne Reno at [facility])  Patient Profile:   Nicholas [name] is a 86 y.o. male with a hx o CAD s/p atherectomy and DES to pLAD 07/2018. chronic systolic CHF, Right nephrectomy for renal cell tumor, ESRD on HD TTS, IDT2DM, HTN, HLD, anemia of chronic disease requiring transfusion and DM who is being seen today for the evaluation of syncope at the request of Dr. Posey Pronto.   Admitted 07/2018 with anterior NSTEMI. Cath showed high-grade LAD in setting of severe three-vessel coronary disease with total occlusion of RCA and the dominant obtuse marginal of the circumflex. The patient decided against consideration of bypass surgery.After long discussion with patient, wife and son he underwent atherectomy and PCI with DES to pLAD. 1dt dig jailed by stent. Continue ASA, Plavix, statin and metoprolol. Echo showed LVEF of 35-40% with Wm abnormality. Continued low dose BB. No ACE/ARB due to ESRD. Volume managed by dialysis.  Few months later he was admitted to the hospital with diverticular bleeding.  Multiple transfusions were required.  Ultimately aspirin was discontinued after both aspirin and Plavix were paused for a week.  History of Present Illness:   Nicholas [name] admitted for evaluation of syncope.  Yesterday morning he was sitting at his kitchen table and suddenly passed out.  He fall on table.  Loss of consciousness for few seconds.  Denies prodromal symptoms of chest pain, shortness of breath or palpitation.  Witnessed by wife.  Lately his blood pressure running low during dialysis.  He takes metoprolol succinate 12.5 mg on dialysis days and 25 mg on nondialysis days.  Saturday after dialysis he did not felt quite well for few hours.  Able to sleep well.  Denies  orthopnea, PND, syncope, lower extremity edema or melena.  He started elliptical exercise approximately few weeks ago.  Denies exertional chest tightness or shortness of breath.  He gets fatigue after few activities.  COVID negative  HgbA1c 7.1 Hemoglobin 7.7>>7.2 Hs-Troponin 19>>35>>96>>90 Echo this admission showed improved LVEF to 60-65% as below\  1. Left ventricular ejection fraction, by estimation, is 60 to 65%. The  left ventricle has normal function. The left ventricle has no regional  wall motion abnormalities. Left ventricular diastolic parameters are  consistent with Grade I diastolic  dysfunction (impaired relaxation). Elevated left ventricular end-diastolic  pressure.  2. Right ventricular systolic function is normal. The right ventricular  size is normal. There is normal pulmonary artery systolic pressure.  3. The mitral valve is normal in structure and function. No evidence of  mitral valve regurgitation. No evidence of mitral stenosis.  4. The aortic valve is tricuspid. Aortic valve regurgitation is mild.  Mild aortic valve sclerosis is present, with no evidence of aortic valve  stenosis.  5. The inferior vena cava is normal in size with greater than 50%  respiratory variability, suggesting right atrial pressure of 3 mmHg.   Heart Pathway Score:     Past Medical History:  Diagnosis Date  . Arthritis   . Chronic kidney disease   . Diabetes mellitus ([place])   . Dialysis patient ([place])   . GERD (gastroesophageal reflux disease)    occ  . Heart murmur   . Hypercholesteremia   . Hypertension   . Psoriasis     Past Surgical History:  Procedure Laterality Date  .  CARDIAC CATHETERIZATION  09   "some blockage" with collaterals by cath at [facility] ~ 2009; no intervention required; reportedly, no routine cardiology f/u recommended   . CORONARY ATHERECTOMY N/A 08/09/2018   Procedure: CORONARY ATHERECTOMY;  Surgeon: Jettie Booze, MD;  Location: [place] [facility];   Service: Cardiovascular;  Laterality: N/A;  . CORONARY STENT INTERVENTION N/A 08/09/2018   Procedure: CORONARY STENT INTERVENTION;  Surgeon: Jettie Booze, MD;  Location: [place] [facility];  Service: Cardiovascular;  Laterality: N/A;  . INGUINAL HERNIA REPAIR Right 04/13/2012   Procedure: HERNIA REPAIR INGUINAL INCARCERATED;  Surgeon: Gayland Curry, MD;  Location: [place];  Service: General;  Laterality: Right;  . INSERTION OF MESH Right 04/13/2012   Procedure: INSERTION OF MESH;  Surgeon: Gayland Curry, MD;  Location: [place];  Service: General;  Laterality: Right;  . LEFT HEART CATH N/A 08/09/2018   Procedure: Left Heart Cath;  Surgeon: Jettie Booze, MD;  Location: [place] [facility];  Service: Cardiovascular;  Laterality: N/A;  . LEFT HEART CATH AND CORONARY ANGIOGRAPHY N/A 08/08/2018   Procedure: LEFT HEART CATH AND CORONARY ANGIOGRAPHY;  Surgeon: Troy Sine, MD;  Location: [place] [facility];  Service: Cardiovascular;  Laterality: N/A;  . VASECTOMY       Inpatient Medications: Scheduled Meds: . atorvastatin  80 mg Oral q1800  . [START ON 03/19/2019] cinacalcet  60 mg Oral Once per day on Tue Thu Sat   And  . cinacalcet  30 mg Oral Once per day on Sun Mon Wed Fri  . clopidogrel  75 mg Oral Q breakfast  . insulin aspart  0-15 Units Subcutaneous TID WC  . [START ON 03/19/2019] metoprolol succinate  12.5 mg Oral Once per day on Tue Thu Sat   And  . metoprolol tartrate  25 mg Oral Once per day on Sun Mon Wed Fri  . pantoprazole  40 mg Oral BID  . polyethylene glycol  17 g Oral BID  . sodium chloride flush  3 mL Intravenous Q12H  . sodium chloride flush  3 mL Intravenous Q12H   Continuous Infusions: . sodium chloride     PRN Meds: sodium chloride, acetaminophen **OR** acetaminophen, nitroGLYCERIN, sodium chloride flush  Allergies:    Allergies  Allergen Reactions  . Hydrochlorothiazide Other (See Comments)    Unknown [facility]    Social History:   Social  History   Socioeconomic History  . Marital status: Married    Spouse name: Not on file  . Number of children: Not on file  . Years of education: Not on file  . Highest education level: Not on file  Occupational History  . Not on file  Tobacco Use  . Smoking status: Former Smoker    Packs/day: 1.00    Years: 15.00    Pack years: 15.00    Types: Cigarettes    Quit date: 03/29/1987    Years since quitting: 31.9  . Smokeless tobacco: Never Used  Substance and Sexual Activity  . Alcohol use: No  . Drug use: No  . Sexual activity: Not on file  Other Topics Concern  . Not on file  Social History Narrative  . Not on file   Social Determinants of Health   Financial Resource Strain:   . Difficulty of Paying Living Expenses: Not on file  Food Insecurity:   . Worried About Charity fundraiser in the Last Year: Not on file  . Ran Out of  Food in the Last Year: Not on file  Transportation Needs:   . Lack of Transportation (Medical): Not on file  . Lack of Transportation (Non-Medical): Not on file  Physical Activity:   . Days of Exercise per Week: Not on file  . Minutes of Exercise per Session: Not on file  Stress:   . Feeling of Stress : Not on file  Social Connections:   . Frequency of Communication with Friends and Family: Not on file  . Frequency of Social Gatherings with Friends and Family: Not on file  . Attends Religious Services: Not on file  . Active Member of Clubs or Organizations: Not on file  . Attends Archivist Meetings: Not on file  . Marital Status: Not on file  Intimate Partner Violence:   . Fear of Current or Ex-Partner: Not on file  . Emotionally Abused: Not on file  . Physically Abused: Not on file  . Sexually Abused: Not on file    Family History:   Family History  Problem Relation Age of Onset  . Diabetes Mother   . Stroke Father   . Diabetes Sister   . Diabetes Brother      ROS:  Please see the history of present illness.  All other  ROS reviewed and negative.     Physical Exam/Data:   Vitals:   03/18/19 0353 03/18/19 0735 03/18/19 1048 03/18/19 1102  BP: (!) 105/46 (!) 95/48 123/65 (!) 93/50  Pulse: 95 96    Resp: 20 18    Temp: 98.3 F (36.8 C) 98.5 F (36.9 C)    TempSrc: Oral Oral    SpO2: 95% 95%    Weight: 73.3 kg     Height:        Intake/Output Summary (Last 24 hours) at 03/18/2019 1420 Last data filed at 03/18/2019 1300 Gross per 24 hour  Intake 480 ml  Output --  Net 480 ml   Last 3 Weights 03/18/2019 03/17/2019 03/17/2019  Weight (lbs) 161 lb 8 oz 163 lb 9.6 oz 163 lb  Weight (kg) 73.256 kg 74.208 kg 73.936 kg     Body mass index is 25.29 kg/m.  General:  Well nourished, well developed, in no acute distress HEENT: normal Lymph: no adenopathy Neck: no JVD Endocrine:  No thryomegaly Vascular: No carotid bruits; FA pulses 2+ bilaterally without bruits  Cardiac:  normal S1, S2; RRR; no murmur  Lungs:  clear to auscultation bilaterally, no wheezing, rhonchi or rales  Abd: soft, nontender, no hepatomegaly  Ext: no edema Musculoskeletal:  No deformities, BUE and BLE strength normal and equal Skin: warm and dry  Neuro:  CNs 2-12 intact, no focal abnormalities noted Psych:  Normal affect   EKG:  The EKG was personally reviewed and demonstrates:  Sinus tachycardia at 106 bpm Telemetry:  Telemetry was personally reviewed and demonstrates: Sinus rhythm at rate of 70 bpm, intermittent tachycardia  Relevant CV Studies:  CORONARY STENT INTERVENTION  CORONARY ATHERECTOMY  Left Heart Cath  Conclusion    Prox LAD lesion is 90% stenosed.  A drug-eluting stent was successfully placed using a STENT SYNERGY DES 3.5X16.  1st Diag lesion is 80% stenosed and jailed by the stent. TIMIT 1 flow.  1st Mrg lesion is 100% stenosed.  Prox Cx lesion is 80% stenosed.  Post intervention, there is a 0% residual stenosis.  LV end diastolic pressure is normal.  There is no aortic valve  stenosis.  Continue clopidogrel along with aggressive secondary prevention.  Diagnostic Dominance: Right  Intervention   2D Doppler echocardiogram performed on 08/07/2018  IMPRESSIONS   1. The left ventricle has moderately reduced systolic function, with an ejection fraction of 35-40%. The cavity size was normal. There is mild asymmetric left ventricular hypertrophy of the basal anteroseptal wall. Left ventricular diastolic Doppler  parameters are consistent with pseudonormalization. Elevated left atrial and left ventricular end-diastolic pressures The E/e' is >25. 2. Moderate hypokinesis of the left ventricular, mid-apical anteroseptal wall, anterior segment, anterior wall and inferoapical. 3. The right ventricle has normal systolic function. The cavity was normal. There is no increase in right ventricular wall thickness. 4. The mitral valve is abnormal. Mild thickening of the mitral valve leaflet. There is mild mitral annular calcification present. Mitral valve regurgitation is mild to moderate by color flow Doppler. 5. The tricuspid valve is abnormal. 6. The aortic valve is tricuspid. Mild calcification of the aortic valve. No stenosis of the aortic valve.  Coronary angiography 08/08/2018: Diagnostic Dominance: Right     Laboratory Data:  High Sensitivity Troponin:   Recent Labs  Lab 03/17/19 1347 03/17/19 2058 03/18/19 0842 03/18/19 1029  TROPONINIHS 19* 35* 96* 90*     Chemistry Recent Labs  Lab 03/17/19 1347  NA 133*  K 4.1  CL 91*  CO2 27  GLUCOSE 60*  BUN 51*  CREATININE 7.36*  CALCIUM 8.8*  GFRNONAA 6*  GFRAA 7*  ANIONGAP 15    Recent Labs  Lab 03/17/19 1347  PROT 7.4  ALBUMIN 3.5  AST 23  ALT 21  ALKPHOS 86  BILITOT 0.4   Hematology Recent Labs  Lab 03/17/19 1347 03/18/19 0402  WBC 11.7* 8.7  RBC 2.64* 2.44*  HGB 7.7* 7.2*  HCT 24.1* 22.2*  MCV 91.3 91.0  MCH 29.2 29.5  MCHC 32.0 32.4  RDW 16.4* 16.0*  PLT 217  217   Radiology/Studies:  DG Chest Portable 1 View  Result Date: 03/17/2019 CLINICAL DATA:  Shortness of breath EXAM: PORTABLE CHEST 1 VIEW COMPARISON:  Chest radiograph dated 08/06/2018 FINDINGS: The heart is borderline enlarged. Vascular calcifications are seen in the aortic arch. The lungs are clear. The osseous structures are intact. IMPRESSION: Borderline cardiomegaly.  No acute pulmonary process. Electronically Signed   By: Zerita Boers M.D.   On: 03/17/2019 14:02   ECHOCARDIOGRAM COMPLETE  Result Date: 03/18/2019    ECHOCARDIOGRAM REPORT   Patient Name:   Nicholas [name] Date of Exam: 03/18/2019 Medical Rec #:  [id number]    Height:       67.0 in Accession #:    [id number]   Weight:       161.5 lb Date of Birth:  04-Oct-1930    BSA:          1.847 m Patient Age:    84 years     BP:           93/50 mmHg Patient Gender: M            HR:           89 bpm. Exam Location:  Inpatient Procedure: 2D Echo, Cardiac Doppler and Color Doppler Indications:    Syncope 780.2  History:        Patient has prior history of Echocardiogram examinations, most                 recent 08/07/2018. CAD, Arrythmias:non-specific ST changes,                 Signs/Symptoms:Syncope; Risk Factors:Hypertension,  Diabetes,                 Dyslipidemia and Former Smoker.  Sonographer:    Vickie Epley RDCS Referring Phys: [id number] [place]  1. Left ventricular ejection fraction, by estimation, is 60 to 65%. The left ventricle has normal function. The left ventricle has no regional wall motion abnormalities. Left ventricular diastolic parameters are consistent with Grade I diastolic dysfunction (impaired relaxation). Elevated left ventricular end-diastolic pressure.  2. Right ventricular systolic function is normal. The right ventricular size is normal. There is normal pulmonary artery systolic pressure.  3. The mitral valve is normal in structure and function. No evidence of mitral valve regurgitation. No evidence of mitral  stenosis.  4. The aortic valve is tricuspid. Aortic valve regurgitation is mild. Mild aortic valve sclerosis is present, with no evidence of aortic valve stenosis.  5. The inferior vena cava is normal in size with greater than 50% respiratory variability, suggesting right atrial pressure of 3 mmHg. FINDINGS  Left Ventricle: Left ventricular ejection fraction, by estimation, is 60 to 65%. The left ventricle has normal function. The left ventricle has no regional wall motion abnormalities. The left ventricular internal cavity size was normal in size. There is  no left ventricular hypertrophy. Left ventricular diastolic parameters are consistent with Grade I diastolic dysfunction (impaired relaxation). Elevated left ventricular end-diastolic pressure. Right Ventricle: The right ventricular size is normal. No increase in right ventricular wall thickness. Right ventricular systolic function is normal. There is normal pulmonary artery systolic pressure. The tricuspid regurgitant velocity is 2.54 m/s, and  with an assumed right atrial pressure of 3 mmHg, the estimated right ventricular systolic pressure is 48.5 mmHg. Left Atrium: Left atrial size was normal in size. Right Atrium: Right atrial size was normal in size. Pericardium: There is no evidence of pericardial effusion. Mitral Valve: The mitral valve is normal in structure and function. Normal mobility of the mitral valve leaflets. No evidence of mitral valve regurgitation. No evidence of mitral valve stenosis. Tricuspid Valve: The tricuspid valve is normal in structure. Tricuspid valve regurgitation is trivial. No evidence of tricuspid stenosis. Aortic Valve: The aortic valve is tricuspid. Aortic valve regurgitation is mild. Mild aortic valve sclerosis is present, with no evidence of aortic valve stenosis. Pulmonic Valve: The pulmonic valve was normal in structure. Pulmonic valve regurgitation is not visualized. No evidence of pulmonic stenosis. Aorta: The aortic  root is normal in size and structure. Venous: The inferior vena cava is normal in size with greater than 50% respiratory variability, suggesting right atrial pressure of 3 mmHg. IAS/Shunts: No atrial level shunt detected by color flow Doppler.  LEFT VENTRICLE PLAX 2D LVIDd:         5.00 cm  Diastology LVIDs:         3.50 cm  LV e' lateral:   7.62 cm/s LV PW:         0.70 cm  LV E/e' lateral: 15.4 LV IVS:        0.70 cm  LV e' medial:    6.09 cm/s LVOT diam:     1.50 cm  LV E/e' medial:  19.2 LV SV:         42.76 ml LV SV Index:   23.16 LVOT Area:     1.77 cm  RIGHT VENTRICLE RV S prime:     11.20 cm/s TAPSE (M-mode): 2.0 cm LEFT ATRIUM             Index  LA diam:        2.90 cm 1.57 cm/m LA Vol (A2C):   50.5 ml 27.35 ml/m LA Vol (A4C):   53.0 ml 28.70 ml/m LA Biplane Vol: 53.9 ml 29.19 ml/m  AORTIC VALVE LVOT Vmax:   122.00 cm/s LVOT Vmean:  82.400 cm/s LVOT VTI:    0.242 m  AORTA Ao Root diam: 3.40 cm MITRAL VALVE                TRICUSPID VALVE MV Area (PHT): 2.66 cm     TR Peak grad:   25.8 mmHg MV Decel Time: 285 msec     TR Vmax:        254.00 cm/s MV E velocity: 117.00 cm/s MV A velocity: 153.00 cm/s  SHUNTS MV E/A ratio:  0.76         Systemic VTI:  0.24 m                             Systemic Diam: 1.50 cm Fransico Him MD Electronically signed by Fransico Him MD Signature Date/Time: 03/18/2019/2:12:22 PM    Final    {  Assessment and Plan:   1.  Syncope -Unknown etiology.  Witnessed by wife.  No prodromal symptoms.  Wife noted low blood pressure at home as well as in ER. On Toprol XL 12.5 mg on dialysis days and 25 mg on nondialysis days. -EKG without acute ischemic changes -Telemetry without arrhythmia -Echocardiogram result normalization of LV function.  No wall motion abnormality - Will stop metoprolol.  - Plan for cardiac monitor at discharge   2.  Elevated troponin with CAD s/p atherectomy and DES to LAD -Declined CABG.  Has residual disease - Hs-Troponin 19>>35>>96>>90 -No chest pain  or shortness of breath -Recently started exercise  -Not on aspirin due to hx of recurrent GI bleed -Continue Plavix and statin - Stop BB as above   3.  Chronic systolic heart failure -Echocardiogram this admission showed normalization of LV function -Volume managed by dialysis  4.  End-stage renal disease -On dialysis Tuesday, Thursday and Saturday  5.  Anemia of chronic disease -Last admission he required multiple transfusion -Hemoglobin 7.7>>7.2 today -Transfuse per primary team (recomending Hgb above 8 given hx of CAD)   For questions or updates, please contact [place] Please consult [email or web address] for contact info under     Jarrett Soho, [place]  03/18/2019 2:20 PM   Patient seen and examined.  Agree with above documentation.  Nicholas [name] is 86 year old male with a history of CAD status post DES to proximal LAD in 07/2018, heart failure with reduced EF (EF 35 to 40%), renal tumor status post right nephrectomy, ESRD on TTS HD, IDDM, hypertension, anemia.  Cardiology is consulted at the request of Dr. Posey Pronto for evaluation of syncope.  He presented in July 2020 with NSTEMI.  Cath showed severe three-vessel coronary disease with CTO of RCA, CTO OM, severe proximal LAD disease.  Patient declined CABG.  Ultimately underwent PCI with DES to proximal LAD.  He was discharged on aspirin, Plavix, statin, metoprolol.  TTE showed EF 35 to 40%.  He was subsequently admitted in September 2020 with GI bleed.  Hemoglobin down to 5.1.  He received 5 units of PRBCs during admission and hemoglobin was stable at 7 on discharge.  Aspirin was held but he was continued on Plavix.  He denies any recent GI bleeding.  Reports that he is started  exercising, using the elliptical.  Denies any exertional chest pain.  Does report that he becomes fatigued easily.  Reports that he had a syncopal episode yesterday.  States that he had walked to his kitchen and sat at the table.  Denies any chest pain,  shortness of breath, palpitations.  Reports that after sitting at the table his vision went black and he passed out onto the table.  His wife states that she looked back and saw him unconscious, lasted for a few seconds.  He was taken to the ED and found to have BP down to 70s over 40s.  He does report that he felt unsteady on his feet following his dialysis session on Saturday.  EKG personally reviewed and shows incomplete left bundle branch block, sinus tachycardia, rate 110, ST depressions in leads V3-6 telemetry.  Telemetry personally reviewed and shows sinus rhythm with rate in 70s, no events.  High-sensitivity troponin peaked at 96.  TTE today shows LV systolic function has improved, now 60 to 65%.  Suspect syncopal episode is secondary to hypotension.  Will hold metoprolol.  Arrhythmia also on differential, will continue to monitor on telemetry and likely plan for cardiac monitor on discharge.  Anemia could also be contributing, recommend transfusion for goal hemoglobin greater than 8 given CAD.  Donato Heinz, MD

## 2019-03-18 NOTE — Consult Note (Signed)
[facility] Renal Consultation Note  Requesting MD: Budd Palmer, MD  Indication for Consultation:  ESRD  Chief complaint: passed out   HPI:  Nicholas [name] is a 86 y.o. male with a history of ESRD on HD TTS [place], DM, HTN and reported prostate cancer who presented with syncope.  He had a witnessed syncopal event while sitting at the kitchen table on Sunday morning, 2/21.  His wife was with him and is here today. Hx of a similar admission as well.  Per outpatient HD RN, he left at 75.1 the last treatment on 2/20.  He states that he felt tired and drained after dialysis on Saturday but did not cramp.  Does have cramping if they pull a lot of fluid.  Per HD RN, his orders are as below; he normally has a max of 1 kg removed - sometimes comes below his EDW. No hx syncope at dialysis that his outpatient RN is aware of.  Cardiology has been consulted and has stopped metoprolol.  The patient states he has had kidney cancer and has had a nephrectomy but denies any history of prostate cancer.  He denies any blood per rectum or dark stools.  Note that he does have a history of a GI bleed in 01/2019 with requiring multiple units of packed red blood cells.  History of gastric AVMs.  We discussed the risks benefits and indications of transfusion and he consents to get packed red blood cells.  Asks when he might be going home.  Outpatient at [facility] patient 3.5 hours  EDW 74.5 kg  3K/2.5 BF 450; DF 800 Not getting heparin  revaclear max  Not on ESA due to hx cancer  hectorol 2 mcg each treatment Per outpatient HD RN, he left at 75.1 the last treatment on 2/20.  PMHx:   Past Medical History:  Diagnosis Date  . Arthritis   . Chronic kidney disease   . Diabetes mellitus ([place])   . Dialysis patient ([place])   . GERD (gastroesophageal reflux disease)    occ  . Heart murmur   . Hypercholesteremia   . Hypertension   . Psoriasis     Past Surgical History:  Procedure  Laterality Date  . CARDIAC CATHETERIZATION  09   "some blockage" with collaterals by cath at [facility] ~ 2009; no intervention required; reportedly, no routine cardiology f/u recommended   . CORONARY ATHERECTOMY N/A 08/09/2018   Procedure: CORONARY ATHERECTOMY;  Surgeon: Jettie Booze, MD;  Location: [facility];  Service: Cardiovascular;  Laterality: N/A;  . CORONARY STENT INTERVENTION N/A 08/09/2018   Procedure: CORONARY STENT INTERVENTION;  Surgeon: Jettie Booze, MD;  Location: [facility] [facility];  Service: Cardiovascular;  Laterality: N/A;  . INGUINAL HERNIA REPAIR Right 04/13/2012   Procedure: HERNIA REPAIR INGUINAL INCARCERATED;  Surgeon: Gayland Curry, MD;  Location: [place];  Service: General;  Laterality: Right;  . INSERTION OF MESH Right 04/13/2012   Procedure: INSERTION OF MESH;  Surgeon: Gayland Curry, MD;  Location: [place];  Service: General;  Laterality: Right;  . LEFT HEART CATH N/A 08/09/2018   Procedure: Left Heart Cath;  Surgeon: Jettie Booze, MD;  Location: [place] [facility];  Service: Cardiovascular;  Laterality: N/A;  . LEFT HEART CATH AND CORONARY ANGIOGRAPHY N/A 08/08/2018   Procedure: LEFT HEART CATH AND CORONARY ANGIOGRAPHY;  Surgeon: Troy Sine, MD;  Location: [place] [facility];  Service: Cardiovascular;  Laterality: N/A;  .  VASECTOMY      Family Hx:  Family History  Problem Relation Age of Onset  . Diabetes Mother   . Stroke Father   . Diabetes Sister   . Diabetes Brother     Social History:  reports that he quit smoking about 31 years ago. His smoking use included cigarettes. He has a 15.00 pack-year smoking history. He has never used smokeless tobacco. He reports that he does not drink alcohol or use drugs.  Allergies:  Allergies  Allergen Reactions  . Hydrochlorothiazide Other (See Comments)    [facility]    Medications: Prior to Admission medications   Medication Sig Start Date End Date Taking? Authorizing Provider   atorvastatin (LIPITOR) 80 MG tablet Take 1 tablet (80 mg total) by mouth daily at 6 PM. Patient taking differently: Take 80 mg by mouth at bedtime.  08/10/18  Yes [name], MD  cinacalcet (SENSIPAR) 30 MG tablet Take 30-60 mg by mouth See admin instructions. Take 2 tablets (60 mg) by mouth on Tuesday, Thursday, Saturday (dialysis days), take 1 tablet (30 mg) on Sunday, Monday, Wednesday, Friday (non-dialysis days)   Yes [provider]  clopidogrel (PLAVIX) 75 MG tablet Take 1 tablet (75 mg total) by mouth daily with breakfast. 08/11/18  Yes [name], MD  insulin NPH-regular Human (70-30) 100 UNIT/ML injection Inject 20 Units into the skin See admin instructions. Inject 20 units subcutaneously in the morning as needed for CBG >140, inject 20 units every evening   Yes [provider]  lidocaine-prilocaine (EMLA) cream Apply 1 application topically See admin instructions. Apply topically 30 minutes prior to dialysis on Tuesday, Thursday, Saturday 11/15/18  Yes [provider]  metoprolol succinate (TOPROL-XL) 25 MG 24 hr tablet Take 1 tablet (25 mg total) by mouth See admin instructions. Take 1/2 tablet (25 mg) on non dialysis days -  Sunday, Monday, Wednesday, Friday. Take with or immediately following a meal. Patient taking differently: Take 12.5-25 mg by mouth See admin instructions. Take 1/2 tablet (12.5 mg) by mouth after dialysis on Tuesday, Thursday, Saturday, take 1 tablet (25 mg) on Sunday, Monday, Wednesday, Friday mornings 08/10/18  Yes [name], MD  nitroGLYCERIN (NITROSTAT) 0.4 MG SL tablet Place 1 tablet (0.4 mg total) under the tongue every 5 (five) minutes x 3 doses as needed for chest pain. 08/10/18  Yes [name], MD  Nutritional Supplements (NEPRO/CARBSTEADY PO) Take 237 mLs by mouth daily. vanilla   Yes [provider]  pantoprazole (PROTONIX) 40 MG tablet Take 1 tablet (40 mg total) by mouth 2 (two) times  daily. Patient not taking: Reported on 03/17/2019 10/04/18   Regalado, Jerald Kief A, MD  polyethylene glycol (MIRALAX / GLYCOLAX) 17 g packet Take 17 g by mouth 2 (two) times daily. Patient not taking: Reported on 03/17/2019 10/04/18   Niel Hummer A, MD    I have reviewed the patient's current and reported prior to admission medications.  Labs:  BMP Latest Ref Rng & Units 03/18/2019 03/17/2019 10/04/2018  Glucose 70 - 99 mg/dL 159(H) 60(L) 150(H)  BUN 8 - 23 mg/dL 57(H) 51(H) 29(H)  Creatinine 0.61 - 1.24 mg/dL 9.11(H) 7.36(H) 6.95(H)  Sodium 135 - 145 mmol/L 138 133(L) 137  Potassium 3.5 - 5.1 mmol/L 4.8 4.1 3.7  Chloride 98 - 111 mmol/L 95(L) 91(L) 99  CO2 22 - 32 mmol/L 28 27 24   Calcium 8.9 - 10.3 mg/dL 8.7(L) 8.8(L) 7.7(L)    ROS:  Pertinent items noted in HPI and  remainder of comprehensive ROS otherwise negative.  Physical Exam: Vitals:   03/18/19 1048 03/18/19 1102  BP: 123/65 (!) 93/50  Pulse:    Resp:    Temp:    SpO2:       General: Adult male in bed in no acute distress at rest HEENT: Normocephalic atraumatic Eyes: Extraocular movements intact sclera anicteric Neck: Trachea midline Heart: S1-S2 no rub appreciated Lungs: Clear to auscultation bilaterally normal work of breathing at rest on room air Abdomen: Soft nontender nondistended Extremities: Trace if any edema appreciated no cyanosis or clubbing Skin: No rash on extremities exposed Neuro: Oriented x3 provides a history and follows commands Psych normal mood and affect Access left upper extremity AV fistula with bruit and thrill  Assessment/Plan:  # End-stage renal disease on hemodialysis TTS - Next HD treatment on 2/23 per outpatient schedule - would avoid morphine for pain given ESRD pt  # Syncope - Limited UF with HD - Note also anemia may be contributing  # Hypertension - Noted patient hypotensive initially.  # Anemia CKD - Not on ESA secondary to history of cancer diagnosis per outpatient HD  RN - Transfuse as needed -Transfuse 1 unit of packed red blood cells today. -Do note history of GI bleed last month at [facility]   # Secondary hyperparathyroidism - Will continue his outpatient Hectorol and note on sensipar.  Would continue binders  # Chronic systolic CHF  - Manage volume with HD  Claudia Desanctis 03/18/2019, 4:08 PM

## 2019-03-18 NOTE — Progress Notes (Signed)
  Echocardiogram 2D Echocardiogram has been performed.  Nicholas [name] 03/18/2019, 1:57 PM

## 2019-03-18 NOTE — Progress Notes (Signed)
PROGRESS NOTE    Nicholas [name]  MOQ:[id number] DOB: 1931-01-24 DOA: 03/17/2019 PCP: [facility], [facility]    Brief Narrative:  86 year old African-American male with PMH of hypertension, type 2 diabetes mellitus, ESRD on HD (TTS), heart failure reduced EF (LVEF 30 to 35% in 07/2018), CAD s/p PCI to pLAD in 07/2018 (angiography revealing severe multivessel disease but patient preferred PCI over CABG), prior history of cecal vascular malformation bleed/recent suspected diverticular bleed requiring multiple blood transfusions who presented to the ED with syncope without prodrome.  History of 2 prior similar episodes last month.  Hypotensive on presentation which seems to have improved with no intervention.  Initial EKG showed T wave inversions in lead I, aVL.  High-sensitivity troponin peaked at 96.  PVCs on telemetry.  Cardiology on board.   Assessment & Plan:   Active Problems:   Diabetes mellitus ([place])   Hypertension   ESRD (end stage renal disease) on dialysis (HCC)   Anemia of chronic disease   Syncope and collapse  Syncope - With ?Vision changes prior to passing out.  No palpitations.  Recurrent episodes since last month.  No confusion on waking up.  Unclear etiology. - ?Cerebral hypoperfusion from volume removal during dialysis given his anemia, atherosclerotic blood vessels versus arrhythmia - Echocardiogram shows recovered LVEF - Orthostatic vitals ordered, pending -Telemetry  CAD s/p PCI to proximal LAD in 07/2018 HFrEF with LVEF 35-40%, Moderate hypokinesis of the left ventricular, mid-apical anteroseptal  wall, anterior segment, anterior wall and inferoapical (07/2018)  - History of NSTEMI in 07/2018 when coronary angiogram showed severe triple-vessel CAD.  At the time patient decided against CABG.  Underwent high risk PCI to pLAD and placed on DAPT.  As noted below, during 09/2018 admission, aspirin was discontinued and he was continued on Plavix alone due to GI bleed.  - Repeat  echo on 03/18/2019 showed recovered LVEF  - Continue Plavix, statin  -While at bedside, complained of chest discomfort after his bed alarm rang.  He was noted to be tachycardic.  Despite few minutes of rest after switching off the bed alarm, complained of continued chest discomfort and was tachycardic.  BP at the time 123/65.  Patient requested sublingual nitro following which his blood pressure was 93/50 but his chest discomfort resolved.  EKG taken at the time showed sinus tachycardia with ST-T changes concerning for ischemia in the anterolateral leads.  Episode concerning for angina.  Cardiology consulted.  Normochromic normocytic anemia - Suspected due to chronic blood loss and renal failure requiring recurrent blood transfusions. Ongoing evaluation by [facility] as outpatient.  Plan is for capsule endoscopy on 04/01/2019.  - On chart review, recent admission from 01/31/2019-02/04/2019 for GI bleed requiring total of 4 units PRBC for which he underwent EGD that showed gastric AVMs s/p APC with negative push enteroscopy and gastric erythema; colonoscopy with cauterization of cecal angiodysplasia.  Plavix was held during this hospitalization and resumed on discharge.  Hemoglobin on day of discharge 1/112021- 8.4.  Hemoglobin on admission 7.7.  Patient denies any dark stools or bright red blood per rectum.  - Also noted 09/27/2018-10/04/2018 admission for GI bleed requiring blood transfusions suspected to be diverticular when his aspirin was discontinued.  Followed up with cardiologist Dr. Daneen Schick in 10/2018 who continued Plavix alone.  -No active signs of bleeding currently.  Continue PPI p.o. twice daily  -Monitor H&H daily.  Transfuse for hemoglobin <7. May benefit from transfusion goal of 8 given multivessel disease.  Blood transfusion tomorrow with hemodialysis.   ESRD on HD (TTS) [facility] Nephrology consulted for hemodialysis  Type 2 diabetes  [employer] [employer] insulin   DVT prophylaxis: SCD/Compression stockings given concern for bleeding Code Status: Full code  Family Communication:  Disposition Plan: Pending medical stability.  Admitted from home.   Consultants:   Cardiology, nephrology  Procedures:  None  Antimicrobials:   None   Subjective: Other than chest discomfort from the bed alarm, he denies any complaints.  Denies any dark stools or bright red bleeding per rectum.  Says he has chest discomfort and on and off at home which resolved with sublingual nitro.  Objective: Vitals:   03/18/19 0353 03/18/19 0735 03/18/19 1048 03/18/19 1102  BP: (!) 105/46 (!) 95/48 123/65 (!) 93/50  Pulse: 95 96    Resp: 20 18    Temp: 98.3 F (36.8 C) 98.5 F (36.9 C)    TempSrc: Oral Oral    SpO2: 95% 95%    Weight: 73.3 kg     Height:        Intake/Output Summary (Last 24 hours) at 03/18/2019 1429 Last data filed at 03/18/2019 1300 Gross per 24 hour  Intake 480 ml  Output --  Net 480 ml   Filed Weights   03/17/19 1248 03/17/19 1715 03/18/19 0353  Weight: 73.9 kg 74.2 kg 73.3 kg    Examination:  General exam: Appears calm and comfortable  Respiratory system: Clear to auscultation. Respiratory effort normal. Cardiovascular system: S1 & S2 heard, RRR. No JVD, murmurs. No pedal edema. Gastrointestinal system: Abdomen is nondistended, soft and nontender. Normal bowel sounds heard. Central nervous system: Alert and oriented. No focal neurological deficits. Extremities: Symmetric 5 x 5 power. Skin: No rashes, lesions or ulcers Psychiatry: Judgement and insight appear normal. Mood & affect appropriate.   Data Reviewed: I have personally reviewed following labs and imaging studies  CBC: Recent Labs  Lab 03/17/19 1347 03/18/19 0402  WBC 11.7* 8.7  NEUTROABS 9.3*  --   HGB 7.7* 7.2*  HCT 24.1* 22.2*  MCV 91.3 91.0  PLT 217 810   Basic Metabolic Panel: Recent Labs  Lab 03/17/19 1347  NA 133*  K  4.1  CL 91*  CO2 27  GLUCOSE 60*  BUN 51*  CREATININE 7.36*  CALCIUM 8.8*  MG 2.2   GFR: Estimated Creatinine Clearance: 6.7 mL/min (A) (by C-G formula based on SCr of 7.36 mg/dL (H)). Liver Function Tests: Recent Labs  Lab 03/17/19 1347  AST 23  ALT 21  ALKPHOS 86  BILITOT 0.4  PROT 7.4  ALBUMIN 3.5   No results for input(s): LIPASE, AMYLASE in the last 168 hours. No results for input(s): AMMONIA in the last 168 hours. Coagulation Profile: Recent Labs  Lab 03/18/19 0402  INR 1.0   Cardiac Enzymes: No results for input(s): CKTOTAL, CKMB, CKMBINDEX, TROPONINI in the last 168 hours. BNP (last 3 results) No results for input(s): PROBNP in the last 8760 hours. HbA1C: Recent Labs    03/17/19 1817  HGBA1C 7.1*   CBG: Recent Labs  Lab 03/17/19 1727 03/17/19 2133 03/18/19 0640 03/18/19 1129  GLUCAP 140* 199* 157* 234*   Lipid Profile: No results for input(s): CHOL, HDL, LDLCALC, TRIG, CHOLHDL, LDLDIRECT in the last 72 hours. Thyroid Function Tests: No results for input(s): TSH, T4TOTAL, FREET4, T3FREE, THYROIDAB in the last 72 hours. Anemia Panel: No results for input(s): VITAMINB12, FOLATE, FERRITIN, TIBC, IRON, RETICCTPCT in the last 72 hours. Sepsis Labs: No results  for input(s): PROCALCITON, LATICACIDVEN in the last 168 hours.  Recent Results (from the past 240 hour(s))  SARS CORONAVIRUS 2 (TAT 6-24 HRS) Nasopharyngeal Nasopharyngeal Swab     Status: None   Collection Time: 03/17/19  4:06 PM   Specimen: Nasopharyngeal Swab  Result Value Ref Range Status   SARS Coronavirus 2 NEGATIVE NEGATIVE Final    Comment: (NOTE) SARS-CoV-2 target nucleic acids are NOT DETECTED. The SARS-CoV-2 RNA is generally detectable in upper and lower respiratory specimens during the acute phase of infection. Negative results do not preclude SARS-CoV-2 infection, do not rule out co-infections with other pathogens, and should not be used as the sole basis for treatment or other  patient management decisions. Negative results must be combined with clinical observations, patient history, and epidemiological information. The expected result is Negative. Fact Sheet for Patients: SugarRoll.be Fact Sheet for Healthcare Providers: https://www.woods-mathews.com/ This test is not yet approved or cleared by the [place] FDA and  has been authorized for detection and/or diagnosis of SARS-CoV-2 by FDA under an Emergency Use Authorization (EUA). This EUA will remain  in effect (meaning this test can be used) for the duration of the COVID-19 declaration under Section 56 4(b)(1) of the Act, 21 U.S.C. section 360bbb-3(b)(1), unless the authorization is terminated or revoked sooner. Performed at [facility], [place] [address], [place], [place] [address]   MRSA PCR Screening     Status: None   Collection Time: 03/18/19  6:57 AM   Specimen: Nasopharyngeal  Result Value Ref Range Status   MRSA by PCR NEGATIVE NEGATIVE Final    Comment:        The GeneXpert MRSA Assay (FDA approved for NASAL specimens only), is one component of a comprehensive MRSA colonization surveillance program. It is not intended to diagnose MRSA infection nor to guide or monitor treatment for MRSA infections. Performed at [facility], [place] [address], [place], [place] [address]          Radiology Studies: DG Chest Portable 1 View  Result Date: 03/17/2019 CLINICAL DATA:  Shortness of breath EXAM: PORTABLE CHEST 1 VIEW COMPARISON:  Chest radiograph dated 08/06/2018 FINDINGS: The heart is borderline enlarged. Vascular calcifications are seen in the aortic arch. The lungs are clear. The osseous structures are intact. IMPRESSION: Borderline cardiomegaly.  No acute pulmonary process. Electronically Signed   By: Zerita Boers M.D.   On: 03/17/2019 14:02   ECHOCARDIOGRAM COMPLETE  Result Date: 03/18/2019    ECHOCARDIOGRAM REPORT   Patient Name:    KAZ AULD Date of Exam: 03/18/2019 Medical Rec #:  [id number]    Height:       67.0 in Accession #:    [id number]   Weight:       161.5 lb Date of Birth:  May 08, 1930    BSA:          1.847 m Patient Age:    48 years     BP:           93/50 mmHg Patient Gender: M            HR:           89 bpm. Exam Location:  Inpatient Procedure: 2D Echo, Cardiac Doppler and Color Doppler Indications:    Syncope 780.2  History:        Patient has prior history of Echocardiogram examinations, most                 recent 08/07/2018. CAD,  Arrythmias:non-specific ST changes,                 Signs/Symptoms:Syncope; Risk Factors:Hypertension, Diabetes,                 Dyslipidemia and Former Smoker.  Sonographer:    Vickie Epley RDCS Referring Phys: [id number] [place]  1. Left ventricular ejection fraction, by estimation, is 60 to 65%. The left ventricle has normal function. The left ventricle has no regional wall motion abnormalities. Left ventricular diastolic parameters are consistent with Grade I diastolic dysfunction (impaired relaxation). Elevated left ventricular end-diastolic pressure.  2. Right ventricular systolic function is normal. The right ventricular size is normal. There is normal pulmonary artery systolic pressure.  3. The mitral valve is normal in structure and function. No evidence of mitral valve regurgitation. No evidence of mitral stenosis.  4. The aortic valve is tricuspid. Aortic valve regurgitation is mild. Mild aortic valve sclerosis is present, with no evidence of aortic valve stenosis.  5. The inferior vena cava is normal in size with greater than 50% respiratory variability, suggesting right atrial pressure of 3 mmHg. FINDINGS  Left Ventricle: Left ventricular ejection fraction, by estimation, is 60 to 65%. The left ventricle has normal function. The left ventricle has no regional wall motion abnormalities. The left ventricular internal cavity size was normal in size. There is  no left  ventricular hypertrophy. Left ventricular diastolic parameters are consistent with Grade I diastolic dysfunction (impaired relaxation). Elevated left ventricular end-diastolic pressure. Right Ventricle: The right ventricular size is normal. No increase in right ventricular wall thickness. Right ventricular systolic function is normal. There is normal pulmonary artery systolic pressure. The tricuspid regurgitant velocity is 2.54 m/s, and  with an assumed right atrial pressure of 3 mmHg, the estimated right ventricular systolic pressure is 11.9 mmHg. Left Atrium: Left atrial size was normal in size. Right Atrium: Right atrial size was normal in size. Pericardium: There is no evidence of pericardial effusion. Mitral Valve: The mitral valve is normal in structure and function. Normal mobility of the mitral valve leaflets. No evidence of mitral valve regurgitation. No evidence of mitral valve stenosis. Tricuspid Valve: The tricuspid valve is normal in structure. Tricuspid valve regurgitation is trivial. No evidence of tricuspid stenosis. Aortic Valve: The aortic valve is tricuspid. Aortic valve regurgitation is mild. Mild aortic valve sclerosis is present, with no evidence of aortic valve stenosis. Pulmonic Valve: The pulmonic valve was normal in structure. Pulmonic valve regurgitation is not visualized. No evidence of pulmonic stenosis. Aorta: The aortic root is normal in size and structure. Venous: The inferior vena cava is normal in size with greater than 50% respiratory variability, suggesting right atrial pressure of 3 mmHg. IAS/Shunts: No atrial level shunt detected by color flow Doppler.  LEFT VENTRICLE PLAX 2D LVIDd:         5.00 cm  Diastology LVIDs:         3.50 cm  LV e' lateral:   7.62 cm/s LV PW:         0.70 cm  LV E/e' lateral: 15.4 LV IVS:        0.70 cm  LV e' medial:    6.09 cm/s LVOT diam:     1.50 cm  LV E/e' medial:  19.2 LV SV:         42.76 ml LV SV Index:   23.16 LVOT Area:     1.77 cm  RIGHT  VENTRICLE RV S prime:  11.20 cm/s TAPSE (M-mode): 2.0 cm LEFT ATRIUM             Index LA diam:        2.90 cm 1.57 cm/m LA Vol (A2C):   50.5 ml 27.35 ml/m LA Vol (A4C):   53.0 ml 28.70 ml/m LA Biplane Vol: 53.9 ml 29.19 ml/m  AORTIC VALVE LVOT Vmax:   122.00 cm/s LVOT Vmean:  82.400 cm/s LVOT VTI:    0.242 m  AORTA Ao Root diam: 3.40 cm MITRAL VALVE                TRICUSPID VALVE MV Area (PHT): 2.66 cm     TR Peak grad:   25.8 mmHg MV Decel Time: 285 msec     TR Vmax:        254.00 cm/s MV E velocity: 117.00 cm/s MV A velocity: 153.00 cm/s  SHUNTS MV E/A ratio:  0.76         Systemic VTI:  0.24 m                             Systemic Diam: 1.50 cm Fransico Him MD Electronically signed by Fransico Him MD Signature Date/Time: 03/18/2019/2:12:22 PM    Final         Scheduled Meds: . atorvastatin  80 mg Oral q1800  . [START ON 03/19/2019] cinacalcet  60 mg Oral Once per day on Tue Thu Sat   And  . cinacalcet  30 mg Oral Once per day on Sun Mon Wed Fri  . clopidogrel  75 mg Oral Q breakfast  . insulin aspart  0-15 Units Subcutaneous TID WC  . [START ON 03/19/2019] metoprolol succinate  12.5 mg Oral Once per day on Tue Thu Sat   And  . metoprolol tartrate  25 mg Oral Once per day on Sun Mon Wed Fri  . pantoprazole  40 mg Oral BID  . polyethylene glycol  17 g Oral BID  . sodium chloride flush  3 mL Intravenous Q12H  . sodium chloride flush  3 mL Intravenous Q12H   Continuous Infusions: . sodium chloride       LOS: 1 day    Time spent: Spent more than 30 minutes in coordinating care for this patient including bedside patient care.   Lucky Cowboy, MD [facility] If 7PM-7AM, please contact night-coverage 03/18/2019, 2:29 PM   This document was prepared using Dragon voice recognition software and may contain some unintended transcription errors.

## 2019-03-18 NOTE — Progress Notes (Signed)
Patient troponin 96. MD notified. Will continue to monitor patient.

## 2019-03-18 NOTE — Plan of Care (Signed)

## 2019-03-18 NOTE — Progress Notes (Signed)
Hypoglycemic Event  CBG: 62 at 1559  Treatment: 120 cc orange juice and 1 chocolate chips cookie Symptoms: None  Follow-up CBG: Time: 1628 CBG Result:81  Possible Reasons for Event: Patient refused to eat at lunch  But he got 11 units of insulin. Comments/MD notified: Keep monitoring patient.    Linton Flemings

## 2019-03-19 DIAGNOSIS — I959 Hypotension, unspecified: Secondary | ICD-10-CM

## 2019-03-19 DIAGNOSIS — D500 Iron deficiency anemia secondary to blood loss (chronic): Principal | ICD-10-CM

## 2019-03-19 LAB — BASIC METABOLIC PANEL
Anion gap: 20 — ABNORMAL HIGH (ref 5–15)
BUN: 72 mg/dL — ABNORMAL HIGH (ref 8–23)
CO2: 24 mmol/L (ref 22–32)
Calcium: 8.1 mg/dL — ABNORMAL LOW (ref 8.9–10.3)
Chloride: 95 mmol/L — ABNORMAL LOW (ref 98–111)
Creatinine, Ser: 10.61 mg/dL — ABNORMAL HIGH (ref 0.61–1.24)
GFR calc Af Amer: 5 mL/min — ABNORMAL LOW (ref >60)
GFR calc non Af Amer: 4 mL/min — ABNORMAL LOW (ref >60)
Glucose, Bld: 137 mg/dL — ABNORMAL HIGH (ref 70–99)
Potassium: 5 mmol/L (ref 3.5–5.1)
Sodium: 139 mmol/L (ref 135–145)

## 2019-03-19 LAB — CBC
HCT: 23.3 % — ABNORMAL LOW (ref 39.0–52.0)
Hemoglobin: 7.7 g/dL — ABNORMAL LOW (ref 13.0–17.0)
MCH: 29.4 pg (ref 26.0–34.0)
MCHC: 33 g/dL (ref 30.0–36.0)
MCV: 88.9 fL (ref 80.0–100.0)
Platelets: 206 10*3/uL (ref 150–400)
RBC: 2.62 MIL/uL — ABNORMAL LOW (ref 4.22–5.81)
RDW: 15.9 % — ABNORMAL HIGH (ref 11.5–15.5)
WBC: 8.4 10*3/uL (ref 4.0–10.5)
nRBC: 0 % (ref 0.0–0.2)

## 2019-03-19 LAB — GLUCOSE, CAPILLARY
Glucose-Capillary: 135 mg/dL — ABNORMAL HIGH (ref 70–99)
Glucose-Capillary: 172 mg/dL — ABNORMAL HIGH (ref 70–99)
Glucose-Capillary: 90 mg/dL (ref 70–99)
Glucose-Capillary: 147 mg/dL — ABNORMAL HIGH (ref 70–99)

## 2019-03-19 LAB — HEMOGLOBIN AND HEMATOCRIT, BLOOD
HCT: 24.7 % — ABNORMAL LOW (ref 39.0–52.0)
Hemoglobin: 8.1 g/dL — ABNORMAL LOW (ref 13.0–17.0)

## 2019-03-19 LAB — PREPARE RBC (CROSSMATCH)

## 2019-03-19 LAB — TSH: TSH: 4.548 u[IU]/mL — ABNORMAL HIGH (ref 0.350–4.500)

## 2019-03-19 LAB — HEPATITIS B SURFACE ANTIGEN: Hepatitis B Surface Ag: NONREACTIVE

## 2019-03-19 MED ORDER — SODIUM CHLORIDE 0.9 % IV SOLN: 100.0000 mL | INTRAVENOUS | Status: DC | PRN

## 2019-03-19 MED ORDER — LIDOCAINE-PRILOCAINE 2.5-2.5 % EX CREA: 1.0000 "application " | TOPICAL_CREAM | CUTANEOUS | Status: DC | PRN

## 2019-03-19 MED ORDER — SODIUM CHLORIDE 0.9% IV SOLUTION: Freq: Once | INTRAVENOUS | Status: DC

## 2019-03-19 MED ORDER — LIDOCAINE HCL (PF) 1 % IJ SOLN: 5.0000 mL | INTRAMUSCULAR | Status: DC | PRN

## 2019-03-19 MED ORDER — PENTAFLUOROPROP-TETRAFLUOROETH EX AERO: 1.0000 "application " | INHALATION_SPRAY | CUTANEOUS | Status: DC | PRN

## 2019-03-19 MED ORDER — ALTEPLASE 2 MG IJ SOLR: 2.0000 mg | Freq: Once | INTRAMUSCULAR | Status: DC | PRN

## 2019-03-19 MED ORDER — DOXERCALCIFEROL 4 MCG/2ML IV SOLN
INTRAVENOUS | Status: AC
  Administered 2019-03-19: 2 ug
  Filled 2019-03-19: qty 2

## 2019-03-19 MED ORDER — HEPARIN SODIUM (PORCINE) 1000 UNIT/ML DIALYSIS: 1000.0000 [IU] | INTRAMUSCULAR | Status: DC | PRN

## 2019-03-19 NOTE — Evaluation (Signed)
Physical Therapy Evaluation Patient Details Name: Nicholas [name] MRN: [id number] DOB: 23-May-1930 Today's Date: 03/19/2019   History of Present Illness  Pt is 86 y.o. male with medical history significant of HTN, anemia, DM, ESRD on dialysis, HFrEF (EF of 30-35%), MI s/p angiography revealing severe multi-vessel disease  with stent proximal LAD on 07/2018, who presented to ED after syncopal episode.  Pt had dialysis earlier today.  Clinical Impression  Pt admitted with above diagnosis. Pt required min guard for safety and was able to ambulate 22' with cane.  He reports he is near his baseline.  Pt's had some "wooziness" upon sitting that resolved quickly - orthostatic bp negative.  Will benefit from PT to progress to complete baseline while hospitalized.  Pt currently with functional limitations due to the deficits listed below (see PT Problem List). Pt will benefit from skilled PT to increase their independence and safety with mobility to allow discharge to the venue listed below.       Follow Up Recommendations Home health PT;Supervision/Assistance - 24 hour    Equipment Recommendations  None recommended by PT    Recommendations for Other Services       Precautions / Restrictions Precautions Precautions: Fall      Mobility  Bed Mobility Overal bed mobility: Needs Assistance Bed Mobility: Supine to Sit     Supine to sit: Min guard;HOB elevated     General bed mobility comments: increased time  Transfers Overall transfer level: Needs assistance Equipment used: Straight cane Transfers: Sit to/from Stand Sit to Stand: Min guard         General transfer comment: increased time and effort  Ambulation/Gait Ambulation/Gait assistance: Min guard Gait Distance (Feet): 22 Feet Assistive device: Straight cane Gait Pattern/deviations: Decreased stride length;Wide base of support;Trunk flexed Gait velocity: decreased   General Gait Details: fatigued easily  Stairs             Wheelchair Mobility    Modified Rankin (Stroke Patients Only)       Balance Overall balance assessment: Needs assistance Sitting-balance support: Feet supported;No upper extremity supported Sitting balance-Leahy Scale: Good     Standing balance support: Single extremity supported;During functional activity Standing balance-Leahy Scale: Fair                               Pertinent Vitals/Pain Pain Assessment: No/denies pain   Orthostatic BPs  Supine 113/59 and HR 96  Sitting 104/59 and HR 100     Standing 108/55 and HR 102  O2 sats 92% on RA       Home Living Family/patient expects to be discharged to:: Private residence Living Arrangements: Spouse/significant other;Children Available Help at Discharge: Family;Available PRN/intermittently Type of Home: House Home Access: Stairs to enter   Entrance Stairs-Number of Steps: 1 Home Layout: Two level;Bed/bath upstairs Home Equipment: Walker - 4 wheels;Cane - single point;Shower seat;Wheelchair - Regulatory affairs officer / Transfers Assistance Needed: Reports uses cane and "furniture walks" in home; uses w/c at HD  ADL's / Homemaking Assistance Needed: Reports could perform ADLs but with difficulty at times        Hand Dominance        Extremity/Trunk Assessment   Upper Extremity Assessment Upper Extremity Assessment: Overall WFL for tasks assessed    Lower Extremity Assessment Lower Extremity Assessment: Overall WFL for tasks assessed    Cervical / Trunk Assessment  Cervical / Trunk Assessment: Normal  Communication   Communication: No difficulties  Cognition Arousal/Alertness: Awake/alert Behavior During Therapy: WFL for tasks assessed/performed Overall Cognitive Status: Within Functional Limits for tasks assessed                                        General Comments      Exercises     Assessment/Plan    PT Assessment Patient needs continued  PT services  PT Problem List Decreased strength;Decreased mobility;Decreased activity tolerance;Cardiopulmonary status limiting activity;Decreased balance;Decreased knowledge of use of DME       PT Treatment Interventions DME instruction;Therapeutic activities;Gait training;Therapeutic exercise;Patient/family education;Stair training;Balance training;Functional mobility training    PT Goals (Current goals can be found in the Care Plan section)  Acute Rehab PT Goals Patient Stated Goal: return home PT Goal Formulation: With patient Time For Goal Achievement: 04/02/19 Potential to Achieve Goals: Good    Frequency Min 3X/week   Barriers to discharge        Co-evaluation               AM-PAC PT "6 Clicks" Mobility  Outcome Measure Help needed turning from your back to your side while in a flat bed without using bedrails?: None Help needed moving from lying on your back to sitting on the side of a flat bed without using bedrails?: None Help needed moving to and from a bed to a chair (including a wheelchair)?: None Help needed standing up from a chair using your arms (e.g., wheelchair or bedside chair)?: None Help needed to walk in hospital room?: A Little Help needed climbing 3-5 steps with a railing? : A Little 6 Click Score: 22    End of Session Equipment Utilized During Treatment: Gait belt Activity Tolerance: Patient tolerated treatment well Patient left: in chair;with call bell/phone within reach Nurse Communication: Mobility status PT Visit Diagnosis: Other abnormalities of gait and mobility (R26.89)    Time: 1700-1725 PT Time Calculation (min) (ACUTE ONLY): 25 min   Charges:   PT Evaluation $PT Eval Low Complexity: 1 Low          Maggie Font, PT Acute Rehab Services Pager [phone] [facility] [phone] [facility] 03/19/2019, 5:36 PM

## 2019-03-19 NOTE — Progress Notes (Signed)
[facility] Progress Note  Name: Nicholas [name] MRN: [id number] DOB: 1930-10-28  Chief Complaint:  Passed out   Subjective:  No recurrent syncope or presyncope.  denies any dizziness or cramping.  Seen and examined on HD. 92/44 and HR 94.  UF goal 500 mL.  Denies any blood per rectum or dark stools.  Got blood yesterday.   Review of systems:  Denies shortness of breath or chest pain Denies n/v    Intake/Output Summary (Last 24 hours) at 03/19/2019 1424 Last data filed at 03/19/2019 0900 Gross per 24 hour  Intake 877 ml  Output --  Net 877 ml    Vitals:  Vitals:   03/19/19 1305 03/19/19 1315 03/19/19 1330 03/19/19 1400  BP: (!) 83/49 (!) 91/47 (!) 87/44 (!) 92/44  Pulse: 89 90 90 94  Resp:      Temp:      TempSrc:      SpO2:      Weight:      Height:         Physical Exam:  General adult male in bed in no acute distress HEENT normocephalic atraumatic extraocular movements intact sclera anicteric Neck supple trachea midline Lungs clear to auscultation bilaterally normal work of breathing at rest  Heart S1S2 no rub Abdomen soft nontender nondistended Extremities no edema  Psych normal mood and affect Neuro - alert and oriented x 3; follows commands and provides hx Access LUE AVF   Medications reviewed   Labs:  BMP Latest Ref Rng & Units 03/19/2019 03/18/2019 03/17/2019  Glucose 70 - 99 mg/dL 137(H) 159(H) 60(L)  BUN 8 - 23 mg/dL 72(H) 57(H) 51(H)  Creatinine 0.61 - 1.24 mg/dL 10.61(H) 9.11(H) 7.36(H)  Sodium 135 - 145 mmol/L 139 138 133(L)  Potassium 3.5 - 5.1 mmol/L 5.0 4.8 4.1  Chloride 98 - 111 mmol/L 95(L) 95(L) 91(L)  CO2 22 - 32 mmol/L 24 28 27   Calcium 8.9 - 10.3 mg/dL 8.1(L) 8.7(L) 8.8(L)     Assessment/Plan:   # End-stage renal disease on hemodialysis TTS - Next HD treatment today per outpatient TTS schedule - would avoid morphine for pain given ESRD pt  # Syncope - Limited UF with HD - Note also anemia may be contributing  #  Hx Hypertension - Noted patient hypotensive here   # Anemia CKD - Not on ESA secondary to history of cancer diagnosis per his outpatient HD RN - Transfuse as needed - Repeat 1 unit of PRBC's; got 1 unit PRBC's on 2/22 as well  -Do note history of GI bleed last month at [facility]   # Secondary hyperparathyroidism - Will continue his outpatient Hectorol and note on sensipar. States not on binder at home.  # Chronic systolic CHF  - Manage volume with HD; ef improved on TTE this admission  Claudia Desanctis, MD 03/19/2019 2:35 PM

## 2019-03-19 NOTE — Progress Notes (Addendum)
Progress Note  Patient Name: Nicholas [name] Date of Encounter: 03/19/2019  Primary Cardiologist:  Sinclair Grooms, MD  Subjective   No chest pain or SOB Had endo/colon at [facility] when he needed a transfusion, thought they cauterized something, no records available  Inpatient Medications    Scheduled Meds: . atorvastatin  80 mg Oral q1800  . Chlorhexidine Gluconate Cloth  6 each Topical Q0600  . cinacalcet  60 mg Oral Once per day on Tue Thu Sat   And  . cinacalcet  30 mg Oral Once per day on Sun Mon Wed Fri  . clopidogrel  75 mg Oral Q breakfast  . doxercalciferol  2 mcg Oral Q T,Th,Sa-HD  . insulin aspart  0-5 Units Subcutaneous QHS  . insulin aspart  0-6 Units Subcutaneous TID WC  . pantoprazole  40 mg Oral BID  . polyethylene glycol  17 g Oral BID  . sodium chloride flush  3 mL Intravenous Q12H  . sodium chloride flush  3 mL Intravenous Q12H   Continuous Infusions: . sodium chloride     PRN Meds: sodium chloride, acetaminophen **OR** acetaminophen, nitroGLYCERIN, sodium chloride flush   Vital Signs    Vitals:   03/18/19 2300 03/19/19 0039 03/19/19 0559 03/19/19 0722  BP:  (!) 95/49 (!) 92/46 (!) 102/54  Pulse:  88 86 88  Resp:  20 20 16   Temp:  98.5 F (36.9 C) 98.2 F (36.8 C) 98.1 F (36.7 C)  TempSrc:  Oral Oral Oral  SpO2:  94% 94% 91%  Weight: 74.3 kg  74.3 kg   Height:        Intake/Output Summary (Last 24 hours) at 03/19/2019 1012 Last data filed at 03/19/2019 0900 Gross per 24 hour  Intake 1117 ml  Output --  Net 1117 ml   Filed Weights   03/18/19 0353 03/18/19 2300 03/19/19 0559  Weight: 73.3 kg 74.3 kg 74.3 kg   Last Weight  Most recent update: 03/19/2019  6:59 AM   Weight  74.3 kg (163 lb 12.8 oz)           Weight change: 0.363 kg   Telemetry    SR - Personally Reviewed  ECG    None today - Personally Reviewed  Physical Exam   General: Well developed, well nourished, male appearing in no acute distress. Head: Normocephalic,  atraumatic.  Neck: Supple without bruits, JVD mild elevation. Lungs:  Resp regular and unlabored, CTA. Heart: RRR, S1, S2, no S3, S4, soft murmur; no rub. Abdomen: Soft, non-tender, non-distended with normoactive bowel sounds. No hepatomegaly. No rebound/guarding. No obvious abdominal masses. Extremities: No clubbing, cyanosis, no edema. Distal pedal pulses are 2+ bilaterally. Neuro: Alert and oriented X 3. Moves all extremities spontaneously. Psych: Normal affect.  Labs    Hematology Recent Labs  Lab 03/17/19 1347 03/17/19 1347 03/18/19 0402 03/18/19 2310 03/19/19 0534  WBC 11.7*  --  8.7  --   --   RBC 2.64*  --  2.44*  --   --   HGB 7.7*   < > 7.2* 8.4* 8.1*  HCT 24.1*   < > 22.2* 24.4* 24.7*  MCV 91.3  --  91.0  --   --   MCH 29.2  --  29.5  --   --   MCHC 32.0  --  32.4  --   --   RDW 16.4*  --  16.0*  --   --   PLT 217  --  217  --   --    < > =  values in this interval not displayed.    Chemistry Recent Labs  Lab 03/17/19 1347 03/18/19 1029 03/19/19 0534  NA 133* 138 139  K 4.1 4.8 5.0  CL 91* 95* 95*  CO2 27 28 24   GLUCOSE 60* 159* 137*  BUN 51* 57* 72*  CREATININE 7.36* 9.11* 10.61*  CALCIUM 8.8* 8.7* 8.1*  PROT 7.4  --   --   ALBUMIN 3.5 3.1*  --   AST 23  --   --   ALT 21  --   --   ALKPHOS 86  --   --   BILITOT 0.4  --   --   GFRNONAA 6* 5* 4*  GFRAA 7* 5* 5*  ANIONGAP 15 15 20*     High Sensitivity Troponin:   Recent Labs  Lab 03/17/19 1347 03/17/19 2058 03/18/19 0842 03/18/19 1029  TROPONINIHS 19* 35* 96* 90*      BNPNo results for input(s): BNP, PROBNP in the last 168 hours.   No results found for: TSH Lab Results  Component Value Date   HGBA1C 7.1 (H) 03/17/2019     Radiology    DG Chest Portable 1 View  Result Date: 03/17/2019 CLINICAL DATA:  Shortness of breath EXAM: PORTABLE CHEST 1 VIEW COMPARISON:  Chest radiograph dated 08/06/2018 FINDINGS: The heart is borderline enlarged. Vascular calcifications are seen in the  aortic arch. The lungs are clear. The osseous structures are intact. IMPRESSION: Borderline cardiomegaly.  No acute pulmonary process. Electronically Signed   By: Zerita Boers M.D.   On: 03/17/2019 14:02   ECHOCARDIOGRAM COMPLETE  Result Date: 03/18/2019    ECHOCARDIOGRAM REPORT   Patient Name:   [name] Date of Exam: 03/18/2019 Medical Rec #:  [id number]    Height:       67.0 in Accession #:    [id number]   Weight:       161.5 lb Date of Birth:  [birth year]    BSA:          1.847 m Patient Age:    37 years     BP:           93/50 mmHg Patient Gender: M            HR:           89 bpm. Exam Location:  Inpatient Procedure: 2D Echo, Cardiac Doppler and Color Doppler Indications:    Syncope 780.2  History:        Patient has prior history of Echocardiogram examinations, most                 recent 08/07/2018. CAD, Arrythmias:non-specific ST changes,                 Signs/Symptoms:Syncope; Risk Factors:Hypertension, Diabetes,                 Dyslipidemia and Former Smoker.  Sonographer:    Vickie Epley RDCS Referring Phys: [id number] [place]  1. Left ventricular ejection fraction, by estimation, is 60 to 65%. The left ventricle has normal function. The left ventricle has no regional wall motion abnormalities. Left ventricular diastolic parameters are consistent with Grade I diastolic dysfunction (impaired relaxation). Elevated left ventricular end-diastolic pressure.  2. Right ventricular systolic function is normal. The right ventricular size is normal. There is normal pulmonary artery systolic pressure.  3. The mitral valve is normal in structure and function. No evidence of mitral valve regurgitation. No evidence of mitral stenosis.  4. The aortic valve is tricuspid. Aortic valve regurgitation is mild. Mild aortic valve sclerosis is present, with no evidence of aortic valve stenosis.  5. The inferior vena cava is normal in size with greater than 50% respiratory variability, suggesting right atrial  pressure of 3 mmHg. FINDINGS  Left Ventricle: Left ventricular ejection fraction, by estimation, is 60 to 65%. The left ventricle has normal function. The left ventricle has no regional wall motion abnormalities. The left ventricular internal cavity size was normal in size. There is  no left ventricular hypertrophy. Left ventricular diastolic parameters are consistent with Grade I diastolic dysfunction (impaired relaxation). Elevated left ventricular end-diastolic pressure. Right Ventricle: The right ventricular size is normal. No increase in right ventricular wall thickness. Right ventricular systolic function is normal. There is normal pulmonary artery systolic pressure. The tricuspid regurgitant velocity is 2.54 m/s, and  with an assumed right atrial pressure of 3 mmHg, the estimated right ventricular systolic pressure is 01.7 mmHg. Left Atrium: Left atrial size was normal in size. Right Atrium: Right atrial size was normal in size. Pericardium: There is no evidence of pericardial effusion. Mitral Valve: The mitral valve is normal in structure and function. Normal mobility of the mitral valve leaflets. No evidence of mitral valve regurgitation. No evidence of mitral valve stenosis. Tricuspid Valve: The tricuspid valve is normal in structure. Tricuspid valve regurgitation is trivial. No evidence of tricuspid stenosis. Aortic Valve: The aortic valve is tricuspid. Aortic valve regurgitation is mild. Mild aortic valve sclerosis is present, with no evidence of aortic valve stenosis. Pulmonic Valve: The pulmonic valve was normal in structure. Pulmonic valve regurgitation is not visualized. No evidence of pulmonic stenosis. Aorta: The aortic root is normal in size and structure. Venous: The inferior vena cava is normal in size with greater than 50% respiratory variability, suggesting right atrial pressure of 3 mmHg. IAS/Shunts: No atrial level shunt detected by color flow Doppler.  LEFT VENTRICLE PLAX 2D LVIDd:          5.00 cm  Diastology LVIDs:         3.50 cm  LV e' lateral:   7.62 cm/s LV PW:         0.70 cm  LV E/e' lateral: 15.4 LV IVS:        0.70 cm  LV e' medial:    6.09 cm/s LVOT diam:     1.50 cm  LV E/e' medial:  19.2 LV SV:         42.76 ml LV SV Index:   23.16 LVOT Area:     1.77 cm  RIGHT VENTRICLE RV S prime:     11.20 cm/s TAPSE (M-mode): 2.0 cm LEFT ATRIUM             Index LA diam:        2.90 cm 1.57 cm/m LA Vol (A2C):   50.5 ml 27.35 ml/m LA Vol (A4C):   53.0 ml 28.70 ml/m LA Biplane Vol: 53.9 ml 29.19 ml/m  AORTIC VALVE LVOT Vmax:   122.00 cm/s LVOT Vmean:  82.400 cm/s LVOT VTI:    0.242 m  AORTA Ao Root diam: 3.40 cm MITRAL VALVE                TRICUSPID VALVE MV Area (PHT): 2.66 cm     TR Peak grad:   25.8 mmHg MV Decel Time: 285 msec     TR Vmax:        254.00 cm/s MV E velocity: 117.00  cm/s MV A velocity: 153.00 cm/s  SHUNTS MV E/A ratio:  0.76         Systemic VTI:  0.24 m                             Systemic Diam: 1.50 cm Fransico Him MD Electronically signed by Fransico Him MD Signature Date/Time: 03/18/2019/2:12:22 PM    Final      Cardiac Studies   ECHO:  03/18/2019 1. Left ventricular ejection fraction, by estimation, is 60 to 65%. The  left ventricle has normal function. The left ventricle has no regional  wall motion abnormalities. Left ventricular diastolic parameters are  consistent with Grade I diastolic  dysfunction (impaired relaxation). Elevated left ventricular end-diastolic  pressure.  2. Right ventricular systolic function is normal. The right ventricular  size is normal. There is normal pulmonary artery systolic pressure.  3. The mitral valve is normal in structure and function. No evidence of  mitral valve regurgitation. No evidence of mitral stenosis.  4. The aortic valve is tricuspid. Aortic valve regurgitation is mild.  Mild aortic valve sclerosis is present, with no evidence of aortic valve  stenosis.  5. The inferior vena cava is normal in size with  greater than 50%  respiratory variability, suggesting right atrial pressure of 3 mmHg.    Intervention 08/09/2018     Patient Profile     86 y.o. male w/ hx CAD s/p atherectomy and DES to pLAD 07/2018 w/ med rx for multivessel dz, S-CHF,Right nephrectomy for renal cell tumor, ESRD on HD TThS, IDT2DM, HTN, HLD, anemia of chronic disease requiring transfusion,and GERD,who was admitted 02/21 with syncope, cards seeing.  Assessment & Plan    1. Syncope - no sig arrhythmia on telemetry overnight - EF 35-40% 07/2018, now nl on echo, no sig valve abnormalities - will ck TSH - electrolytes do not explain things, although CBG 60 on initial labs  - metoprolol d/c'd on admit, no other BP-lowering meds - orthostatic VS + on admit w/ SBP 81>>74 lying>>standing, HR 86>>87 - recheck orthostatics today  2. Elevated trop, hx CAD - post-PCI diagram above, +substrate for angina - monitor for sx, currently not having any - on Plavix, statin - no BB  3. Anemia chronic dz, also hx heme +stools - s/p 1 u PRBCs this admit - try to keep Hgb>/= 8 - had 4 U blood when hospitalized at [facility] in January - saw Dr Dorrene German 02/08, capsule endoscopy planned  [facility], per IM Active Problems:   Diabetes mellitus ([place])   Hypertension   ESRD (end stage renal disease) on dialysis ([place])   Anemia of chronic disease   Syncope and collapse   Hypotension   Jonetta Speak , PA-C 10:12 AM 03/19/2019 Pager: [phone]  Patient seen and examined.  Agree with above documentation. On exam, patient is alert and oriented, regular rate and rhythm, no murmurs, lungs CTAB, no LE edema or JVD.  No further syncope or lightheadedness.  We will continue to monitor on telemetry, will likely need monitor on discharge.  Suspect low BP/anemia contributing.  Would transfuse for goal hemoglobin above 8.  Donato Heinz, MD

## 2019-03-19 NOTE — Progress Notes (Addendum)
PROGRESS NOTE    Nicholas [name]  SWF:[id number] DOB: [birth year] DOA: 03/17/2019 PCP: [facility]    Brief Narrative:  86 year old African-American male with PMH of hypertension, type 2 diabetes mellitus, ESRD on HD (TTS), heart failure reduced EF (LVEF 30 to 35% in 07/2018), CAD s/p PCI to pLAD in 07/2018 (angiography revealing severe multivessel disease but patient preferred PCI over CABG), prior history of cecal vascular malformation bleed/recent suspected diverticular bleed requiring multiple blood transfusions who presented to the ED with syncope without prodrome.  History of 2 prior similar episodes last month.  Hypotensive on presentation which seems to have improved with no intervention.  Initial EKG showed T wave inversions in lead I, aVL.  High-sensitivity troponin peaked at 96.  PVCs on telemetry.  Cardiology on board.   Assessment & Plan:   Active Problems:   Diabetes mellitus ([place])   Hypertension   ESRD (end stage renal disease) on dialysis (HCC)   Anemia of chronic disease   Syncope and collapse   Hypotension  Syncope - With ?Vision changes prior to passing out.  No palpitations.  Recurrent episodes since last month.  No confusion on waking up.  Unclear etiology. - ?Cerebral hypoperfusion from anemia/ volume removal with dialysis, atherosclerotic blood vessels versus arrhythmia - Echocardiogram shows recovered LVEF - Orthostatic vitals negative -Telemetry shows no obvious arrhythmia  - Will likely need event monitor at discharge per cardiology  CAD s/p PCI to proximal LAD in 07/2018 HFrEF with LVEF 35-40%, Moderate hypokinesis of the left ventricular, mid-apical anteroseptal  wall, anterior segment, anterior wall and inferoapical (07/2018). Repeat echo on 03/18/2019 showed recovered LVEF  - History of NSTEMI in 07/2018 when coronary angiogram showed severe triple-vessel CAD.  At the time patient decided against CABG.  Underwent high risk PCI to pLAD and placed on  DAPT.  As noted below, during 09/2018 admission, aspirin was discontinued and he was continued on Plavix alone due to GI bleed.  - Continue Plavix, statin  Chronic blood anemia Normochromic normocytic - Suspected due to chronic blood loss and renal failure requiring recurrent blood transfusions. Ongoing evaluation by [facility] as outpatient.  Plan is for capsule endoscopy on 04/01/2019.  - On chart review, recent admission from 01/31/2019-02/04/2019 for GI bleed requiring total of 4 units PRBC for which he underwent EGD that showed gastric AVMs s/p APC with negative push enteroscopy and gastric erythema; colonoscopy with cauterization of cecal angiodysplasia.  Plavix was held during this hospitalization and resumed on discharge.  Hemoglobin on day of discharge 1/112021- 8.4.  Hemoglobin on admission 7.7.  Patient denies any dark stools or bright red blood per rectum.  - Also noted 09/27/2018-10/04/2018 admission for GI bleed requiring blood transfusions suspected to be diverticular when his aspirin was discontinued.  Followed up with cardiologist Dr. Daneen Schick in 10/2018 who continued Plavix alone.  -No active signs of bleeding currently. Continue PPI p.o. twice daily  -Monitor H&H daily.  Transfuse to keep Hb>8. S/p 2 Units PRBC (on 2/22, 2/23)  Hypotension  Likely in setting of anemia/ volume removal on HD May be contributing to syncope Orthostatic vitals negative (lying: 81/45, sitting 78/46, standing 74/46) Metoprolol discontinued by cardiology BP much improved with blood transfusion   ESRD on HD (TTS) [facility] patient Nephrology consulted for hemodialysis  Type 2 diabetes mellitus Correctional insulin (switched moderate coverage to highly sensitive given hypoglycemia and h/o ESRD)   DVT prophylaxis: SCD/Compression stockings given concern for bleeding Code Status: Full  code  Family Communication:  Disposition Plan: Pending medical stability.   Admitted from home. PT consulted. If H/H remains stable, can be discharged with event monitor.    Consultants:   Cardiology, nephrology  Procedures:  None  Antimicrobials:   None   Subjective: Reports doing fine. Denies light headedness. Says he has had no bowel movements since admission.   Objective: Vitals:   03/19/19 1445 03/19/19 1500 03/19/19 1530 03/19/19 1545  BP: (!) 106/52 (!) 103/55 (!) 106/57 114/62  Pulse: 94 96 93 96  Resp: 16  18 16   Temp: 98 F (36.7 C)  98.4 F (36.9 C) 98.4 F (36.9 C)  TempSrc: Oral  Oral Oral  SpO2: 92%   97%  Weight:    74.7 kg  Height:        Intake/Output Summary (Last 24 hours) at 03/19/2019 1636 Last data filed at 03/19/2019 1545 Gross per 24 hour  Intake 1192 ml  Output -487 ml  Net 1679 ml   Filed Weights   03/19/19 0559 03/19/19 1200 03/19/19 1545  Weight: 74.3 kg 73.4 kg 74.7 kg    Examination:  General exam: Appears calm and comfortable  Respiratory system: Clear to auscultation. Respiratory effort normal. Cardiovascular system: S1 & S2 heard, RRR. No JVD, murmurs. No pedal edema. Gastrointestinal system: Abdomen is nondistended, soft and nontender. Normal bowel sounds heard. Central nervous system: Alert and oriented. No focal neurological deficits. Extremities: Symmetric 5 x 5 power. Skin: No rashes, lesions or ulcers Psychiatry: Judgement and insight appear normal. Mood & affect appropriate.   Data Reviewed: I have personally reviewed following labs and imaging studies  CBC: Recent Labs  Lab 03/17/19 1347 03/18/19 0402 03/18/19 2310 03/19/19 0534 03/19/19 1300  WBC 11.7* 8.7  --   --  8.4  NEUTROABS 9.3*  --   --   --   --   HGB 7.7* 7.2* 8.4* 8.1* 7.7*  HCT 24.1* 22.2* 24.4* 24.7* 23.3*  MCV 91.3 91.0  --   --  88.9  PLT 217 217  --   --  170   Basic Metabolic Panel: Recent Labs  Lab 03/17/19 1347 03/18/19 1029 03/19/19 0534  NA 133* 138 139  K 4.1 4.8 5.0  CL 91* 95* 95*  CO2 27 28 24    GLUCOSE 60* 159* 137*  BUN 51* 57* 72*  CREATININE 7.36* 9.11* 10.61*  CALCIUM 8.8* 8.7* 8.1*  MG 2.2  --   --   PHOS  --  6.8*  --    GFR: Estimated Creatinine Clearance: 4.7 mL/min (A) (by C-G formula based on SCr of 10.61 mg/dL (H)). Liver Function Tests: Recent Labs  Lab 03/17/19 1347 03/18/19 1029  AST 23  --   ALT 21  --   ALKPHOS 86  --   BILITOT 0.4  --   PROT 7.4  --   ALBUMIN 3.5 3.1*   No results for input(s): LIPASE, AMYLASE in the last 168 hours. No results for input(s): AMMONIA in the last 168 hours. Coagulation Profile: Recent Labs  Lab 03/18/19 0402  INR 1.0   Cardiac Enzymes: No results for input(s): CKTOTAL, CKMB, CKMBINDEX, TROPONINI in the last 168 hours. BNP (last 3 results) No results for input(s): PROBNP in the last 8760 hours. HbA1C: Recent Labs    03/17/19 1817  HGBA1C 7.1*   CBG: Recent Labs  Lab 03/18/19 1618 03/18/19 1826 03/18/19 2159 03/19/19 0559 03/19/19 1117  GLUCAP 81 90 208* 135* 172*   Lipid Profile:  No results for input(s): CHOL, HDL, LDLCALC, TRIG, CHOLHDL, LDLDIRECT in the last 72 hours. Thyroid Function Tests: Recent Labs    03/19/19 1109  TSH 4.548*   Anemia Panel: No results for input(s): VITAMINB12, FOLATE, FERRITIN, TIBC, IRON, RETICCTPCT in the last 72 hours. Sepsis Labs: No results for input(s): PROCALCITON, LATICACIDVEN in the last 168 hours.  Recent Results (from the past 240 hour(s))  SARS CORONAVIRUS 2 (TAT 6-24 HRS) Nasopharyngeal Nasopharyngeal Swab     Status: None   Collection Time: 03/17/19  4:06 PM   Specimen: Nasopharyngeal Swab  Result Value Ref Range Status   SARS Coronavirus 2 NEGATIVE NEGATIVE Final    Comment: (NOTE) SARS-CoV-2 target nucleic acids are NOT DETECTED. The SARS-CoV-2 RNA is generally detectable in upper and lower respiratory specimens during the acute phase of infection. Negative results do not preclude SARS-CoV-2 infection, do not rule out co-infections with other  pathogens, and should not be used as the sole basis for treatment or other patient management decisions. Negative results must be combined with clinical observations, patient history, and epidemiological information. The expected result is Negative. Fact Sheet for Patients: SugarRoll.be Fact Sheet for Healthcare Providers: https://www.woods-mathews.com/ This test is not yet approved or cleared by the [place] FDA and  has been authorized for detection and/or diagnosis of SARS-CoV-2 by FDA under an Emergency Use Authorization (EUA). This EUA will remain  in effect (meaning this test can be used) for the duration of the COVID-19 declaration under Section 56 4(b)(1) of the Act, 21 U.S.C. section 360bbb-3(b)(1), unless the authorization is terminated or revoked sooner. Performed at [facility], [place] [address], [place], [place] [address]   MRSA PCR Screening     Status: None   Collection Time: 03/18/19  6:57 AM   Specimen: Nasopharyngeal  Result Value Ref Range Status   MRSA by PCR NEGATIVE NEGATIVE Final    Comment:        The GeneXpert MRSA Assay (FDA approved for NASAL specimens only), is one component of a comprehensive MRSA colonization surveillance program. It is not intended to diagnose MRSA infection nor to guide or monitor treatment for MRSA infections. Performed at [facility], [place] [address] [place], [place] [address]          Radiology Studies: ECHOCARDIOGRAM COMPLETE  Result Date: 03/18/2019    ECHOCARDIOGRAM REPORT   Patient Name:   SAIVION GOETTEL Date of Exam: 03/18/2019 Medical Rec #:  [id number]    Height:       67.0 in Accession #:    [id number]   Weight:       161.5 lb Date of Birth:  04-21-1930    BSA:          1.847 m Patient Age:    40 years     BP:           93/50 mmHg Patient Gender: M            HR:           89 bpm. Exam Location:  Inpatient Procedure: 2D Echo, Cardiac Doppler and Color Doppler  Indications:    Syncope 780.2  History:        Patient has prior history of Echocardiogram examinations, most                 recent 08/07/2018. CAD, Arrythmias:non-specific ST changes,                 Signs/Symptoms:Syncope; Risk Factors:Hypertension,  Diabetes,                 Dyslipidemia and Former Smoker.  Sonographer:    Vickie Epley RDCS Referring Phys: [id number]  1. Left ventricular ejection fraction, by estimation, is 60 to 65%. The left ventricle has normal function. The left ventricle has no regional wall motion abnormalities. Left ventricular diastolic parameters are consistent with Grade I diastolic dysfunction (impaired relaxation). Elevated left ventricular end-diastolic pressure.  2. Right ventricular systolic function is normal. The right ventricular size is normal. There is normal pulmonary artery systolic pressure.  3. The mitral valve is normal in structure and function. No evidence of mitral valve regurgitation. No evidence of mitral stenosis.  4. The aortic valve is tricuspid. Aortic valve regurgitation is mild. Mild aortic valve sclerosis is present, with no evidence of aortic valve stenosis.  5. The inferior vena cava is normal in size with greater than 50% respiratory variability, suggesting right atrial pressure of 3 mmHg. FINDINGS  Left Ventricle: Left ventricular ejection fraction, by estimation, is 60 to 65%. The left ventricle has normal function. The left ventricle has no regional wall motion abnormalities. The left ventricular internal cavity size was normal in size. There is  no left ventricular hypertrophy. Left ventricular diastolic parameters are consistent with Grade I diastolic dysfunction (impaired relaxation). Elevated left ventricular end-diastolic pressure. Right Ventricle: The right ventricular size is normal. No increase in right ventricular wall thickness. Right ventricular systolic function is normal. There is normal pulmonary artery systolic  pressure. The tricuspid regurgitant velocity is 2.54 m/s, and  with an assumed right atrial pressure of 3 mmHg, the estimated right ventricular systolic pressure is 53.9 mmHg. Left Atrium: Left atrial size was normal in size. Right Atrium: Right atrial size was normal in size. Pericardium: There is no evidence of pericardial effusion. Mitral Valve: The mitral valve is normal in structure and function. Normal mobility of the mitral valve leaflets. No evidence of mitral valve regurgitation. No evidence of mitral valve stenosis. Tricuspid Valve: The tricuspid valve is normal in structure. Tricuspid valve regurgitation is trivial. No evidence of tricuspid stenosis. Aortic Valve: The aortic valve is tricuspid. Aortic valve regurgitation is mild. Mild aortic valve sclerosis is present, with no evidence of aortic valve stenosis. Pulmonic Valve: The pulmonic valve was normal in structure. Pulmonic valve regurgitation is not visualized. No evidence of pulmonic stenosis. Aorta: The aortic root is normal in size and structure. Venous: The inferior vena cava is normal in size with greater than 50% respiratory variability, suggesting right atrial pressure of 3 mmHg. IAS/Shunts: No atrial level shunt detected by color flow Doppler.  LEFT VENTRICLE PLAX 2D LVIDd:         5.00 cm  Diastology LVIDs:         3.50 cm  LV e' lateral:   7.62 cm/s LV PW:         0.70 cm  LV E/e' lateral: 15.4 LV IVS:        0.70 cm  LV e' medial:    6.09 cm/s LVOT diam:     1.50 cm  LV E/e' medial:  19.2 LV SV:         42.76 ml LV SV Index:   23.16 LVOT Area:     1.77 cm  RIGHT VENTRICLE RV S prime:     11.20 cm/s TAPSE (M-mode): 2.0 cm LEFT ATRIUM             Index LA  diam:        2.90 cm 1.57 cm/m LA Vol (A2C):   50.5 ml 27.35 ml/m LA Vol (A4C):   53.0 ml 28.70 ml/m LA Biplane Vol: 53.9 ml 29.19 ml/m  AORTIC VALVE LVOT Vmax:   122.00 cm/s LVOT Vmean:  82.400 cm/s LVOT VTI:    0.242 m  AORTA Ao Root diam: 3.40 cm MITRAL VALVE                 TRICUSPID VALVE MV Area (PHT): 2.66 cm     TR Peak grad:   25.8 mmHg MV Decel Time: 285 msec     TR Vmax:        254.00 cm/s MV E velocity: 117.00 cm/s MV A velocity: 153.00 cm/s  SHUNTS MV E/A ratio:  0.76         Systemic VTI:  0.24 m                             Systemic Diam: 1.50 cm Fransico Him MD Electronically signed by Fransico Him MD Signature Date/Time: 03/18/2019/2:12:22 PM    Final         Scheduled Meds: . sodium chloride   Intravenous Once  . atorvastatin  80 mg Oral q1800  . Chlorhexidine Gluconate Cloth  6 each Topical Q0600  . cinacalcet  60 mg Oral Once per day on Tue Thu Sat   And  . cinacalcet  30 mg Oral Once per day on Sun Mon Wed Fri  . clopidogrel  75 mg Oral Q breakfast  . doxercalciferol  2 mcg Oral Q T,Th,Sa-HD  . insulin aspart  0-5 Units Subcutaneous QHS  . insulin aspart  0-6 Units Subcutaneous TID WC  . pantoprazole  40 mg Oral BID  . polyethylene glycol  17 g Oral BID  . sodium chloride flush  3 mL Intravenous Q12H  . sodium chloride flush  3 mL Intravenous Q12H   Continuous Infusions: . sodium chloride       LOS: 2 days    Time spent: Spent less than 30 minutes in coordinating care for this patient including bedside patient care.   Lucky Cowboy, MD [facility] If 7PM-7AM, please contact night-coverage 03/19/2019, 4:36 PM   This document was prepared using Dragon voice recognition software and may contain some unintended transcription errors.

## 2019-03-20 ENCOUNTER — Ambulatory Visit (INDEPENDENT_AMBULATORY_CARE_PROVIDER_SITE_OTHER): Payer: Medicare Other

## 2019-03-20 DIAGNOSIS — I2510 Atherosclerotic heart disease of native coronary artery without angina pectoris: Secondary | ICD-10-CM

## 2019-03-20 DIAGNOSIS — I10 Essential (primary) hypertension: Secondary | ICD-10-CM

## 2019-03-20 DIAGNOSIS — R55 Syncope and collapse: Secondary | ICD-10-CM

## 2019-03-20 LAB — TYPE AND SCREEN
ABO/RH(D): A NEG
Antibody Screen: NEGATIVE
Unit division: 0
Unit division: 0

## 2019-03-20 LAB — RENAL FUNCTION PANEL
Albumin: 3 g/dL — ABNORMAL LOW (ref 3.5–5.0)
Anion gap: 14 (ref 5–15)
BUN: 28 mg/dL — ABNORMAL HIGH (ref 8–23)
CO2: 30 mmol/L (ref 22–32)
Calcium: 8.1 mg/dL — ABNORMAL LOW (ref 8.9–10.3)
Chloride: 94 mmol/L — ABNORMAL LOW (ref 98–111)
Creatinine, Ser: 6.4 mg/dL — ABNORMAL HIGH (ref 0.61–1.24)
GFR calc Af Amer: 8 mL/min — ABNORMAL LOW (ref >60)
GFR calc non Af Amer: 7 mL/min — ABNORMAL LOW (ref >60)
Glucose, Bld: 149 mg/dL — ABNORMAL HIGH (ref 70–99)
Phosphorus: 4.3 mg/dL (ref 2.5–4.6)
Potassium: 4.9 mmol/L (ref 3.5–5.1)
Sodium: 138 mmol/L (ref 135–145)

## 2019-03-20 LAB — BPAM RBC
Blood Product Expiration Date: 202102242359
Blood Product Expiration Date: 202102282359
ISSUE DATE / TIME: 202102221735
ISSUE DATE / TIME: 202102231441
Unit Type and Rh: 600
Unit Type and Rh: 600

## 2019-03-20 LAB — HEMOGLOBIN AND HEMATOCRIT, BLOOD
HCT: 25.6 % — ABNORMAL LOW (ref 39.0–52.0)
Hemoglobin: 8.7 g/dL — ABNORMAL LOW (ref 13.0–17.0)

## 2019-03-20 LAB — GLUCOSE, CAPILLARY
Glucose-Capillary: 134 mg/dL — ABNORMAL HIGH (ref 70–99)
Glucose-Capillary: 127 mg/dL — ABNORMAL HIGH (ref 70–99)

## 2019-03-20 NOTE — Progress Notes (Signed)
[facility] Progress Note  Name: Draylen Lobue MRN: [id number] DOB: 11/24/1930  Chief Complaint:  Passed out   Subjective:  Feels well.  States no recurrent syncope or presyncope.  denies any dizziness or cramping.  States HD went well.  No UF with HD yesterday. Denies any blood per rectum or dark stools.  Got blood yesterday.   Review of systems:  Denies shortness of breath or chest pain Denies n/v    Intake/Output Summary (Last 24 hours) at 03/20/2019 1222 Last data filed at 03/20/2019 0841 Gross per 24 hour  Intake 675 ml  Output -487 ml  Net 1162 ml    Vitals:  Vitals:   03/19/19 1530 03/19/19 1545 03/19/19 1952 03/20/19 0540  BP: (!) 106/57 114/62 109/60 (!) 104/58  Pulse: 93 96 94 89  Resp: 18 16 19 20   Temp: 98.4 F (36.9 C) 98.4 F (36.9 C) 98.4 F (36.9 C) 99.4 F (37.4 C)  TempSrc: Oral Oral Oral Oral  SpO2:  97% 93% 90%  Weight:  74.7 kg  74.4 kg  Height:         Physical Exam:  General adult male in bed in no acute distress HEENT normocephalic atraumatic extraocular movements intact sclera anicteric Neck supple trachea midline Lungs clear to auscultation bilaterally normal work of breathing at rest  Heart S1S2 no rub Abdomen soft nontender nondistended Extremities no edema  Psych normal mood and affect Neuro - alert and oriented x 3; follows commands and provides hx Access LUE AVF   Medications reviewed   Labs:  BMP Latest Ref Rng & Units 03/19/2019 03/18/2019 03/17/2019  Glucose 70 - 99 mg/dL 137(H) 159(H) 60(L)  BUN 8 - 23 mg/dL 72(H) 57(H) 51(H)  Creatinine 0.61 - 1.24 mg/dL 10.61(H) 9.11(H) 7.36(H)  Sodium 135 - 145 mmol/L 139 138 133(L)  Potassium 3.5 - 5.1 mmol/L 5.0 4.8 4.1  Chloride 98 - 111 mmol/L 95(L) 95(L) 91(L)  CO2 22 - 32 mmol/L 24 28 27   Calcium 8.9 - 10.3 mg/dL 8.1(L) 8.7(L) 8.8(L)     Assessment/Plan:   # End-stage renal disease on hemodialysis TTS - HD per TTS schedule - Ordered labs for today  - would  avoid morphine for pain given ESRD pt  # Syncope - Limited UF with HD - Note also anemia may be contributing  # Hx Hypertension - Noted patient hypotensive here   # Anemia CKD - Not on ESA secondary to history of cancer diagnosis per his outpatient HD RN.  Hb 8.7 this AM - Transfuse as needed - had 1 unit of PRBC's on 2/23; got 1 unit PRBC's on 2/22 as well  - Do note history of GI bleed last month at [facility]   # Secondary hyperparathyroidism - Will continue his outpatient Hectorol and note on sensipar. States not on binder at home.  # Chronic systolic CHF  - Manage volume with HD; ef improved on TTE this admission to 60-65%; note grade 1 diastolic dysfunction   Stable from a renal standpoint.  Disposition per primary team.   Claudia Desanctis, MD 03/20/2019 12:32 PM

## 2019-03-20 NOTE — Progress Notes (Signed)
Renal Navigator notified patient's OP HD clinic/[facility] of plan for discharge today and faxed Discharge Summary and today's Renal Note to provide continuity of care.  Alphonzo Cruise, [place] Renal Navigator [phone]

## 2019-03-20 NOTE — Progress Notes (Signed)
Progress Note  Patient Name: Nicholas [name] Date of Encounter: 03/20/2019  Primary Cardiologist:  Belva Crome III, MD  Subjective   Received 1 unit PRBCs yesterday, Hgb 8.7 this morning.  BP stable (104/58 this morning), did drop low in HD yesterday.  Denies any further lightheadedness or syncope.  Inpatient Medications    Scheduled Meds: . sodium chloride   Intravenous Once  . atorvastatin  80 mg Oral q1800  . Chlorhexidine Gluconate Cloth  6 each Topical Q0600  . cinacalcet  60 mg Oral Once per day on Tue Thu Sat   And  . cinacalcet  30 mg Oral Once per day on Sun Mon Wed Fri  . clopidogrel  75 mg Oral Q breakfast  . doxercalciferol  2 mcg Oral Q T,Th,Sa-HD  . insulin aspart  0-5 Units Subcutaneous QHS  . insulin aspart  0-6 Units Subcutaneous TID WC  . pantoprazole  40 mg Oral BID  . polyethylene glycol  17 g Oral BID  . sodium chloride flush  3 mL Intravenous Q12H  . sodium chloride flush  3 mL Intravenous Q12H   Continuous Infusions: . sodium chloride     PRN Meds: sodium chloride, acetaminophen **OR** acetaminophen, nitroGLYCERIN, sodium chloride flush   Vital Signs    Vitals:   03/19/19 1530 03/19/19 1545 03/19/19 1952 03/20/19 0540  BP: (!) 106/57 114/62 109/60 (!) 104/58  Pulse: 93 96 94 89  Resp: 18 16 19 20   Temp: 98.4 F (36.9 C) 98.4 F (36.9 C) 98.4 F (36.9 C) 99.4 F (37.4 C)  TempSrc: Oral Oral Oral Oral  SpO2:  97% 93% 90%  Weight:  74.7 kg  74.4 kg  Height:        Intake/Output Summary (Last 24 hours) at 03/20/2019 1144 Last data filed at 03/20/2019 0841 Gross per 24 hour  Intake 675 ml  Output -487 ml  Net 1162 ml   Filed Weights   03/19/19 1200 03/19/19 1545 03/20/19 0540  Weight: 73.4 kg 74.7 kg 74.4 kg   Last Weight  Most recent update: 03/20/2019  6:35 AM   Weight  74.4 kg (164 lb 1.6 oz)           Weight change: -0.899 kg   Telemetry    SR with rate in the 80s, no events- Personally Reviewed  ECG    None today -  Personally Reviewed  Physical Exam   General: Well developed, well nourished, male appearing in no acute distress. Head: Normocephalic, atraumatic.  Neck: Supple without bruits, no JVD Lungs:  Resp regular and unlabored, CTA. Heart: RRR, S1, S2, no S3, S4, soft murmur Abdomen: Soft, non-tender, non-distended Extremities: No clubbing, cyanosis, no edema.  Neuro: Alert and oriented X 3. Moves all extremities spontaneously. Psych: Normal affect.  Labs    Hematology Recent Labs  Lab 03/17/19 1347 03/17/19 1347 03/18/19 0402 03/18/19 2310 03/19/19 0534 03/19/19 1300 03/20/19 0509  WBC 11.7*  --  8.7  --   --  8.4  --   RBC 2.64*  --  2.44*  --   --  2.62*  --   HGB 7.7*   < > 7.2*   < > 8.1* 7.7* 8.7*  HCT 24.1*   < > 22.2*   < > 24.7* 23.3* 25.6*  MCV 91.3  --  91.0  --   --  88.9  --   MCH 29.2  --  29.5  --   --  29.4  --  MCHC 32.0  --  32.4  --   --  33.0  --   RDW 16.4*  --  16.0*  --   --  15.9*  --   PLT 217  --  217  --   --  206  --    < > = values in this interval not displayed.    Chemistry Recent Labs  Lab 03/17/19 1347 03/18/19 1029 03/19/19 0534  NA 133* 138 139  K 4.1 4.8 5.0  CL 91* 95* 95*  CO2 27 28 24   GLUCOSE 60* 159* 137*  BUN 51* 57* 72*  CREATININE 7.36* 9.11* 10.61*  CALCIUM 8.8* 8.7* 8.1*  PROT 7.4  --   --   ALBUMIN 3.5 3.1*  --   AST 23  --   --   ALT 21  --   --   ALKPHOS 86  --   --   BILITOT 0.4  --   --   GFRNONAA 6* 5* 4*  GFRAA 7* 5* 5*  ANIONGAP 15 15 20*     High Sensitivity Troponin:   Recent Labs  Lab 03/17/19 1347 03/17/19 2058 03/18/19 0842 03/18/19 1029  TROPONINIHS 19* 35* 96* 90*      BNPNo results for input(s): BNP, PROBNP in the last 168 hours.   Lab Results  Component Value Date   TSH 4.548 (H) 03/19/2019   Lab Results  Component Value Date   HGBA1C 7.1 (H) 03/17/2019     Radiology    DG Chest Portable 1 View  Result Date: 03/17/2019 CLINICAL DATA:  Shortness of breath EXAM: PORTABLE  CHEST 1 VIEW COMPARISON:  Chest radiograph dated 08/06/2018 FINDINGS: The heart is borderline enlarged. Vascular calcifications are seen in the aortic arch. The lungs are clear. The osseous structures are intact. IMPRESSION: Borderline cardiomegaly.  No acute pulmonary process. Electronically Signed   By: Zerita Boers M.D.   On: 03/17/2019 14:02   ECHOCARDIOGRAM COMPLETE  Result Date: 03/18/2019    ECHOCARDIOGRAM REPORT   Patient Name:   Nicholas [name] Date of Exam: 03/18/2019 Medical Rec #:  [id number]    Height:       67.0 in Accession #:    [id number]   Weight:       161.5 lb Date of Birth:  [birth year]    BSA:          1.847 m Patient Age:    85 years     BP:           93/50 mmHg Patient Gender: M            HR:           89 bpm. Exam Location:  Inpatient Procedure: 2D Echo, Cardiac Doppler and Color Doppler Indications:    Syncope 780.2  History:        Patient has prior history of Echocardiogram examinations, most                 recent 08/07/2018. CAD, Arrythmias:non-specific ST changes,                 Signs/Symptoms:Syncope; Risk Factors:Hypertension, Diabetes,                 Dyslipidemia and Former Smoker.  Sonographer:    Vickie Epley RDCS Referring Phys: [id number] [place]  1. Left ventricular ejection fraction, by estimation, is 60 to 65%. The left ventricle has normal function. The left ventricle has no regional wall  motion abnormalities. Left ventricular diastolic parameters are consistent with Grade I diastolic dysfunction (impaired relaxation). Elevated left ventricular end-diastolic pressure.  2. Right ventricular systolic function is normal. The right ventricular size is normal. There is normal pulmonary artery systolic pressure.  3. The mitral valve is normal in structure and function. No evidence of mitral valve regurgitation. No evidence of mitral stenosis.  4. The aortic valve is tricuspid. Aortic valve regurgitation is mild. Mild aortic valve sclerosis is present, with no  evidence of aortic valve stenosis.  5. The inferior vena cava is normal in size with greater than 50% respiratory variability, suggesting right atrial pressure of 3 mmHg. FINDINGS  Left Ventricle: Left ventricular ejection fraction, by estimation, is 60 to 65%. The left ventricle has normal function. The left ventricle has no regional wall motion abnormalities. The left ventricular internal cavity size was normal in size. There is  no left ventricular hypertrophy. Left ventricular diastolic parameters are consistent with Grade I diastolic dysfunction (impaired relaxation). Elevated left ventricular end-diastolic pressure. Right Ventricle: The right ventricular size is normal. No increase in right ventricular wall thickness. Right ventricular systolic function is normal. There is normal pulmonary artery systolic pressure. The tricuspid regurgitant velocity is 2.54 m/s, and  with an assumed right atrial pressure of 3 mmHg, the estimated right ventricular systolic pressure is 30.8 mmHg. Left Atrium: Left atrial size was normal in size. Right Atrium: Right atrial size was normal in size. Pericardium: There is no evidence of pericardial effusion. Mitral Valve: The mitral valve is normal in structure and function. Normal mobility of the mitral valve leaflets. No evidence of mitral valve regurgitation. No evidence of mitral valve stenosis. Tricuspid Valve: The tricuspid valve is normal in structure. Tricuspid valve regurgitation is trivial. No evidence of tricuspid stenosis. Aortic Valve: The aortic valve is tricuspid. Aortic valve regurgitation is mild. Mild aortic valve sclerosis is present, with no evidence of aortic valve stenosis. Pulmonic Valve: The pulmonic valve was normal in structure. Pulmonic valve regurgitation is not visualized. No evidence of pulmonic stenosis. Aorta: The aortic root is normal in size and structure. Venous: The inferior vena cava is normal in size with greater than 50% respiratory  variability, suggesting right atrial pressure of 3 mmHg. IAS/Shunts: No atrial level shunt detected by color flow Doppler.  LEFT VENTRICLE PLAX 2D LVIDd:         5.00 cm  Diastology LVIDs:         3.50 cm  LV e' lateral:   7.62 cm/s LV PW:         0.70 cm  LV E/e' lateral: 15.4 LV IVS:        0.70 cm  LV e' medial:    6.09 cm/s LVOT diam:     1.50 cm  LV E/e' medial:  19.2 LV SV:         42.76 ml LV SV Index:   23.16 LVOT Area:     1.77 cm  RIGHT VENTRICLE RV S prime:     11.20 cm/s TAPSE (M-mode): 2.0 cm LEFT ATRIUM             Index LA diam:        2.90 cm 1.57 cm/m LA Vol (A2C):   50.5 ml 27.35 ml/m LA Vol (A4C):   53.0 ml 28.70 ml/m LA Biplane Vol: 53.9 ml 29.19 ml/m  AORTIC VALVE LVOT Vmax:   122.00 cm/s LVOT Vmean:  82.400 cm/s LVOT VTI:    0.242 m  AORTA  Ao Root diam: 3.40 cm MITRAL VALVE                TRICUSPID VALVE MV Area (PHT): 2.66 cm     TR Peak grad:   25.8 mmHg MV Decel Time: 285 msec     TR Vmax:        254.00 cm/s MV E velocity: 117.00 cm/s MV A velocity: 153.00 cm/s  SHUNTS MV E/A ratio:  0.76         Systemic VTI:  0.24 m                             Systemic Diam: 1.50 cm Fransico Him MD Electronically signed by Fransico Him MD Signature Date/Time: 03/18/2019/2:12:22 PM    Final      Cardiac Studies   ECHO:  03/18/2019 1. Left ventricular ejection fraction, by estimation, is 60 to 65%. The  left ventricle has normal function. The left ventricle has no regional  wall motion abnormalities. Left ventricular diastolic parameters are  consistent with Grade I diastolic  dysfunction (impaired relaxation). Elevated left ventricular end-diastolic  pressure.  2. Right ventricular systolic function is normal. The right ventricular  size is normal. There is normal pulmonary artery systolic pressure.  3. The mitral valve is normal in structure and function. No evidence of  mitral valve regurgitation. No evidence of mitral stenosis.  4. The aortic valve is tricuspid. Aortic valve  regurgitation is mild.  Mild aortic valve sclerosis is present, with no evidence of aortic valve  stenosis.  5. The inferior vena cava is normal in size with greater than 50%  respiratory variability, suggesting right atrial pressure of 3 mmHg.    Intervention 08/09/2018     Patient Profile     86 y.o. male w/ hx CAD s/p atherectomy and DES to pLAD 07/2018 w/ med rx for multivessel dz, S-CHF,Right nephrectomy for renal cell tumor, ESRD on HD TThS, IDT2DM, HTN, HLD, anemia of chronic disease requiring transfusion,and GERD,who was admitted 02/21 with syncope, cards seeing.  Assessment & Plan    Syncope: suspect due to low BP.  Holding home metoprolol.  Arrhythmia on ddx, though nothing seen on telemetry.  Will plan to discharge with monitor.  EF 35-40% 07/2018, TTE this admission shows normal systolic function following revascularization  Elevated trop: High-sensitivity troponin peaked at 96, likely demand ischemia in setting of hypertension and anemia.  No chest pain.  Anemia chronic ds: s/p transfusion with 2 units PRBCs.  Hemoglobin 8.7 this morning.  No GI bleeding reported this admission.  Saw Dr Dorrene German 02/08, capsule endoscopy planned  CAD: This post drug-eluting stent to proximal LAD 07/2018.  LV systolic function has normalized after revascularization.  Unable to tolerate DAPT due to GI bleeding, currently only on Plavix 75 mg daily.  Continue atorvastatin.  Holding metoprolol as above.   [facility] will sign off.   Medication Recommendations: Continue home medications, but would hold metoprolol Other recommendations (labs, testing, etc):  Zio patch x 2 weeks Follow up as an outpatient:  Will schedule f/u    Alisa Graff , PA-C 11:44 AM 03/20/2019 Pager: [phone]

## 2019-03-20 NOTE — Progress Notes (Signed)
Zio patch placed onto patient.  All instructions and information reviewed with patient, they verbalize understanding with no questions. 

## 2019-03-20 NOTE — TOC Initial Note (Addendum)
Transition of Care [facility]) - Initial/Assessment Note    Patient Details  Name: Nicholas [name] MRN: [id number] Date of Birth: 17-Mar-1930  Transition of Care [facility]) CM/SW Contact:    Alberteen Sam, LCSW Phone Number: 03/20/2019, 2:02 PM  Clinical Narrative:                  CSW spoke with patient's spouse Velma regarding discharge planning, she reports she will pick patient up when she receives call from RN stating he's ready. Please call her on the [phone] number.   Velma is agreeable for patient to be set up with home health services with no agency preference.   CSW spoke with Brittney at [facility] patient is currently active with them. They will resume services.   No DME needs identified.   Expected Discharge Plan: [place] Barriers to Discharge: No Barriers Identified   Patient Goals and CMS Choice   CMS Medicare.gov Compare Post Acute Care list provided to:: Patient Represenative (must comment)(spouse Thelma) Choice offered to / list presented to : Spouse  Expected Discharge Plan and Services Expected Discharge Plan: [place] Acute Care Choice: [place] arrangements for the past 2 months: [place] Expected Discharge Date: 03/20/19                         HH Arranged: PT, OT HH Agency: [place] ([place]) Date HH Agency Contacted: 03/20/19 Time [place]: 49 Representative spoke with at [place]: Butch Penny  Prior Living Arrangements/Services Living arrangements for the past 2 months: [place] Lives with:: Spouse Patient language and need for interpreter reviewed:: Yes Do you feel safe going back to the place where you live?: Yes      Need for Family Participation in Patient Care: Yes (Comment) Care giver support system in place?: Yes (comment)   Criminal Activity/Legal Involvement Pertinent to Current Situation/Hospitalization: No - Comment as needed  Activities  of Daily Living Home Assistive Devices/Equipment: Cane (specify quad or straight) ADL Screening (condition at time of admission) Patient's cognitive ability adequate to safely complete daily activities?: Yes Is the patient deaf or have difficulty hearing?: No Does the patient have difficulty seeing, even when wearing glasses/contacts?: No Does the patient have difficulty concentrating, remembering, or making decisions?: No Patient able to express need for assistance with ADLs?: Yes Does the patient have difficulty dressing or bathing?: No Independently performs ADLs?: Yes (appropriate for developmental age) Does the patient have difficulty walking or climbing stairs?: Yes Weakness of Legs: Both Weakness of Arms/Hands: Both  Permission Sought/Granted Permission sought to share information with : Case Manager, Customer service manager, Family Supports Permission granted to share information with : Yes, Verbal Permission Granted  Share Information with NAME: Phineas Semen  Permission granted to share info w AGENCY: [place] granted to share info w Relationship: spouse  Permission granted to share info w Contact Information: [phone]  Emotional Assessment       Orientation: : Oriented to Self, Oriented to Place, Oriented to  Time, Oriented to Situation Alcohol / Substance Use: Not Applicable Psych Involvement: No (comment)  Admission diagnosis:  Syncope and collapse [R55] Syncope, unspecified syncope type [R55] Patient Active Problem List   Diagnosis Date Noted  . Hypotension   . Syncope and collapse 03/17/2019  . Symptomatic anemia 09/28/2018  . GIB (gastrointestinal bleeding) 09/27/2018  . Anemia due to blood  loss 09/27/2018  . Type II diabetes mellitus with renal manifestations ([place]) 09/27/2018  . GERD (gastroesophageal reflux disease) 09/27/2018  . CAD (coronary artery disease) 09/27/2018  . Chronic systolic CHF (congestive heart failure) ([place]) 09/27/2018   . NSTEMI (non-ST elevated myocardial infarction) ([place]) 08/06/2018  . Diabetes mellitus ([place])   . Hypertension   . Hypercholesteremia   . ESRD (end stage renal disease) on dialysis ([place])   . Anemia of chronic disease   . Lung nodule seen on imaging study 04/30/2012   PCP:  Clinic, [place]:  No Pharmacies Listed    Social Determinants of Health (SDOH) Interventions    Readmission Risk Interventions No flowsheet data found.

## 2019-03-20 NOTE — Progress Notes (Signed)
Physical Therapy Treatment Patient Details Name: Nicholas [name] MRN: [id number] DOB: 1930/06/26 Today's Date: 03/20/2019    History of Present Illness Pt is 86 y.o. male with medical history significant of HTN, anemia, DM, ESRD on dialysis, HFrEF (EF of 30-35%), MI s/p angiography revealing severe multi-vessel disease  with stent proximal LAD on 07/2018, who presented today after syncopal episode.  Pt had dialysis earlier today.    PT Comments    Patient seen for mobility progression. Continue to recommend HHPT for further skilled PT services to maximize independence and safety with mobility.   Follow Up Recommendations  Home health PT;Supervision/Assistance - 24 hour     Equipment Recommendations  None recommended by PT    Recommendations for Other Services       Precautions / Restrictions Precautions Precautions: Fall Restrictions Weight Bearing Restrictions: No    Mobility  Bed Mobility Overal bed mobility: Modified Independent Bed Mobility: Supine to Sit           General bed mobility comments: increased time and effort   Transfers Overall transfer level: Needs assistance Equipment used: Straight cane Transfers: Sit to/from Stand Sit to Stand: Min guard         General transfer comment: min guard for safety; cues for safe hand placement  Ambulation/Gait Ambulation/Gait assistance: Min guard;Min assist Gait Distance (Feet): 80 Feet Assistive device: Straight cane;1 person hand held assist(use of rail in hallway) Gait Pattern/deviations: Decreased stride length;Wide base of support;Trunk flexed;Step-through pattern Gait velocity: decreased   General Gait Details: pt using SPC and reaching for objects to hold onto with other hand; mild LOB when using only SPC when turning into hallway   Stairs             Wheelchair Mobility    Modified Rankin (Stroke Patients Only)       Balance Overall balance assessment: Needs assistance Sitting-balance  support: Feet supported;No upper extremity supported Sitting balance-Leahy Scale: Good     Standing balance support: Single extremity supported;During functional activity;Bilateral upper extremity supported Standing balance-Leahy Scale: Poor                              Cognition Arousal/Alertness: Awake/alert Behavior During Therapy: WFL for tasks assessed/performed Overall Cognitive Status: Within Functional Limits for tasks assessed                                        Exercises      General Comments        Pertinent Vitals/Pain Pain Assessment: No/denies pain    Home Living                      Prior Function            PT Goals (current goals can now be found in the care plan section) Acute Rehab PT Goals Patient Stated Goal: return home Progress towards PT goals: Progressing toward goals    Frequency    Min 3X/week      PT Plan Current plan remains appropriate    Co-evaluation              AM-PAC PT "6 Clicks" Mobility   Outcome Measure  Help needed turning from your back to your side while in a flat bed without using bedrails?: None Help needed moving from lying on  your back to sitting on the side of a flat bed without using bedrails?: None Help needed moving to and from a bed to a chair (including a wheelchair)?: None Help needed standing up from a chair using your arms (e.g., wheelchair or bedside chair)?: None Help needed to walk in hospital room?: A Little Help needed climbing 3-5 steps with a railing? : A Little 6 Click Score: 22    End of Session Equipment Utilized During Treatment: Gait belt Activity Tolerance: Patient tolerated treatment well Patient left: in chair;with call bell/phone within reach Nurse Communication: Mobility status PT Visit Diagnosis: Other abnormalities of gait and mobility (R26.89)     Time: 1336-1350 PT Time Calculation (min) (ACUTE ONLY): 14 min  Charges:  $Gait  Training: 8-22 mins                     Earney Navy, PTA Acute Rehabilitation Services Pager: [phone] Office: [phone]     Darliss Cheney 03/20/2019, 4:34 PM

## 2019-03-20 NOTE — Consult Note (Signed)
   [facility] CM Inpatient Consult   03/20/2019  Nicholas [name] Jun 15, 1930 [id number]   Patient screened for high risk score for unplanned readmission score  and for hospitalizations to check if potential Nicholas [name] services are needed.    Primary Care Provider is [facility] new roster checked today and patient is no longer on roster as his primary care provider is solely with the [place], Dr. Windy Fast at [place].  Will sign off. Patient is not in network.  Please place a [name] consult as appropriate and for questions contact:   Natividad Brood, RN BSN [place] [facility] Liaison  [phone] business mobile phone Toll free office [phone]  Fax number: [phone] [name]       that medications are filled through the [facility] confirmed that [place] care provider isno longer with [facility], rather, currently seeing Dr. Sherral Hammers at [place]), who isNOTa [facility] provider and not affiliated with [employer].

## 2019-03-20 NOTE — Discharge Summary (Signed)
Physician Discharge Summary  Nicholas [name] BSJ:[id number] DOB: 1930/05/19 DOA: 03/17/2019  PCP: [facility]  Admit date: 03/17/2019 Discharge date: 03/20/2019  Admitted From: Home Disposition:  Home  Recommendations for Outpatient Follow-up:  1. Follow up with PCP in 1-2 weeks 2. Follow up with Cardiology as scheduled 3. Follow up outpatient event monitor  Home Health:PT   Discharge Condition:Stable CODE STATUS:Full Diet recommendation: Renal, diabetic   Brief/Interim Summary: 86 year old African-American male with PMH of hypertension, type 2 diabetes mellitus, ESRD on HD (TTS), heart failure reduced EF (LVEF 30 to 35% in 07/2018), CAD s/p PCI to pLAD in 07/2018 (angiography revealing severe multivessel disease but patient preferred PCI over CABG), prior history of cecal vascular malformation bleed/recent suspected diverticular bleed requiring multiple blood transfusions who presented to the ED with syncope without prodrome.  History of 2 prior similar episodes last month.  Hypotensive on presentation which seems to have improved with no intervention.  Initial EKG showed T wave inversions in lead I, aVL.  High-sensitivity troponin peaked at 96.  PVCs on telemetry.  Cardiology on board.  Discharge Diagnoses:  Active Problems:   Diabetes mellitus ([place])   Hypertension   ESRD (end stage renal disease) on dialysis (HCC)   Anemia of chronic disease   Syncope and collapse   Hypotension  Syncope -  Possible cerebral hypoperfusion from anemia/ volume removal with dialysis, atherosclerotic blood vessels versus arrhythmia - Echocardiogram shows recovered LVEF - Orthostatic vitals negative -Telemetry shows no obvious arrhythmia  - Cardiology following. Discussed case with Cardiology, OK for discharge today with outpatient event monitor  CAD s/p PCI to proximal LAD in 07/2018 HFrEF with LVEF 35-40%, Moderate hypokinesis of the left ventricular, mid-apical anteroseptal  wall,  anterior segment, anterior wall and inferoapical (07/2018). Repeat echo on 03/18/2019 showed recovered LVEF  - History of NSTEMI in 07/2018 when coronary angiogram showed severe triple-vessel CAD.  At the time patient decided against CABG.  Underwent high risk PCI to pLAD and placed on DAPT.  As noted below, during 09/2018 admission, aspirin was discontinued and he was continued on Plavix alone due to GI bleed.  - Continue Plavix, statin as tolerated -Pt to f/u with Cardiology as outpatient  Chronic blood anemia Normochromic normocytic - Suspected due to chronic blood loss and renal failure requiring recurrent blood transfusions. Ongoing evaluation by [facility] as outpatient.  Plan is for capsule endoscopy on 04/01/2019.  - On chart review, recent admission from 01/31/2019-02/04/2019 for GI bleed requiring total of 4 units PRBC for which he underwent EGD that showed gastric AVMs s/p APC with negative push enteroscopy and gastric erythema; colonoscopy with cauterization of cecal angiodysplasia.  Plavix was held during this hospitalization and resumed on discharge.  Hemoglobin on day of discharge 1/112021- 8.4.  Hemoglobin on admission 7.7.  Patient had denied dark stools or bright red blood per rectum.  - Also noted 09/27/2018-10/04/2018 admission for GI bleed requiring blood transfusions suspected to be diverticular when his aspirin was discontinued.  Followed up with cardiologist Dr. Daneen Schick in 10/2018 who continued Plavix alone.  -No active signs of bleeding currently. Continue PPI p.o. twice daily  -Monitor H&H daily.  Transfuse to keep Hb>8. S/p 2 Units PRBC (on 2/22, 2/23)  Hypotension  Likely in setting of anemia/ volume removal on HD May be contributing to syncope Orthostatic vitals negative (lying: 81/45, sitting 78/46, standing 74/46) Metoprolol discontinued by cardiology BP much improved with blood transfusion   ESRD on HD (TTS)  [name]  patient Nephrology had been following or hemodialysis  Type 2 diabetes mellitus Correctional insulin (switched moderate coverage to highly sensitive given hypoglycemia and h/o ESRD)  Discharge Instructions   Allergies as of 03/20/2019      Reactions   Hydrochlorothiazide Other (See Comments)   Unknown [facility]      Medication List    STOP taking these medications   metoprolol succinate 25 MG 24 hr tablet Commonly known as: TOPROL-XL   polyethylene glycol 17 g packet Commonly known as: MIRALAX / GLYCOLAX     TAKE these medications   atorvastatin 80 MG tablet Commonly known as: LIPITOR Take 1 tablet (80 mg total) by mouth daily at 6 PM. What changed: when to take this   cinacalcet 30 MG tablet Commonly known as: SENSIPAR Take 30-60 mg by mouth See admin instructions. Take 2 tablets (60 mg) by mouth on Tuesday, Thursday, Saturday (dialysis days), take 1 tablet (30 mg) on Sunday, Monday, Wednesday, Friday (non-dialysis days)   clopidogrel 75 MG tablet Commonly known as: PLAVIX Take 1 tablet (75 mg total) by mouth daily with breakfast.   insulin NPH-regular Human (70-30) 100 UNIT/ML injection Inject 20 Units into the skin See admin instructions. Inject 20 units subcutaneously in the morning as needed for CBG >140, inject 20 units every evening   lidocaine-prilocaine cream Commonly known as: EMLA Apply 1 application topically See admin instructions. Apply topically 30 minutes prior to dialysis on Tuesday, Thursday, Saturday   NEPRO/CARBSTEADY PO Take 237 mLs by mouth daily. vanilla   nitroGLYCERIN 0.4 MG SL tablet Commonly known as: NITROSTAT Place 1 tablet (0.4 mg total) under the tongue every 5 (five) minutes x 3 doses as needed for chest pain.   pantoprazole 40 MG tablet Commonly known as: PROTONIX Take 1 tablet (40 mg total) by mouth 2 (two) times daily.      Follow-up Information    [facility]   Why: Please follow up in a week Contact  information: [address] [place] [address] [phone]        Gerhardt, Lori C, NP Follow up on 04/17/2019.   Specialties: Nurse Practitioner, Interventional Cardiology, Cardiology, Radiology Why: Please arrive 15 minutes early for your 9:45am post-hospital cardiology follow-up appointment Contact information: [address] [place] [address] [phone]          Allergies  Allergen Reactions  . Hydrochlorothiazide Other (See Comments)    Unknown [facility]    Consultations:  Cardiology, nephrology  Procedures/Studies: DG Chest Portable 1 View  Result Date: 03/17/2019 CLINICAL DATA:  Shortness of breath EXAM: PORTABLE CHEST 1 VIEW COMPARISON:  Chest radiograph dated 08/06/2018 FINDINGS: The heart is borderline enlarged. Vascular calcifications are seen in the aortic arch. The lungs are clear. The osseous structures are intact. IMPRESSION: Borderline cardiomegaly.  No acute pulmonary process. Electronically Signed   By: Tyler  Litton M.D.   On: 03/17/2019 14:02   ECHOCARDIOGRAM COMPLETE  Result Date: 03/18/2019    ECHOCARDIOGRAM REPORT   Patient Name:   Nicholas [name] Date of Exam: 03/18/2019 Medical Rec #:  [id number]    Height:       67.0 in Accession #:    [id number]   Weight:       16 1.5 lb Date of Birth:  [birth year]    BSA:          1.847 m Patient Age:    57 years     BP:  93/50 mmHg Patient Gender: M            HR:           89 bpm. Exam Location:  Inpatient Procedure: 2D Echo, Cardiac Doppler and Color Doppler Indications:    Syncope 780.2  History:        Patient has prior history of Echocardiogram examinations, most                 recent 08/07/2018. CAD, Arrythmias:non-specific ST changes,                 Signs/Symptoms:Syncope; Risk Factors:Hypertension, Diabetes,                 Dyslipidemia and Former Smoker.  Sonographer:    Vickie Epley RDCS Referring Phys: [id number] [place]  1. Left ventricular  ejection fraction, by estimation, is 60 to 65%. The left ventricle has normal function. The left ventricle has no regional wall motion abnormalities. Left ventricular diastolic parameters are consistent with Grade I diastolic dysfunction (impaired relaxation). Elevated left ventricular end-diastolic pressure.  2. Right ventricular systolic function is normal. The right ventricular size is normal. There is normal pulmonary artery systolic pressure.  3. The mitral valve is normal in structure and function. No evidence of mitral valve regurgitation. No evidence of mitral stenosis.  4. The aortic valve is tricuspid. Aortic valve regurgitation is mild. Mild aortic valve sclerosis is present, with no evidence of aortic valve stenosis.  5. The inferior vena cava is normal in size with greater than 50% respiratory variability, suggesting right atrial pressure of 3 mmHg. FINDINGS  Left Ventricle: Left ventricular ejection fraction, by estimation, is 60 to 65%. The left ventricle has normal function. The left ventricle has no regional wall motion abnormalities. The left ventricular internal cavity size was normal in size. There is  no left ventricular hypertrophy. Left ventricular diastolic parameters are consistent with Grade I diastolic dysfunction (impaired relaxation). Elevated left ventricular end-diastolic pressure. Right Ventricle: The right ventricular size is normal. No increase in right ventricular wall thickness. Right ventricular systolic function is normal. There is normal pulmonary artery systolic pressure. The tricuspid regurgitant velocity is 2.54 m/s, and  with an assumed right atrial pressure of 3 mmHg, the estimated right ventricular systolic pressure is 54.6 mmHg. Left Atrium: Left atrial size was normal in size. Right Atrium: Right atrial size was normal in size. Pericardium: There is no evidence of pericardial effusion. Mitral Valve: The mitral valve is normal in structure and function. Normal mobility  of the mitral valve leaflets. No evidence of mitral valve regurgitation. No evidence of mitral valve stenosis. Tricuspid Valve: The tricuspid valve is normal in structure. Tricuspid valve regurgitation is trivial. No evidence of tricuspid stenosis. Aortic Valve: The aortic valve is tricuspid. Aortic valve regurgitation is mild. Mild aortic valve sclerosis is present, with no evidence of aortic valve stenosis. Pulmonic Valve: The pulmonic valve was normal in structure. Pulmonic valve regurgitation is not visualized. No evidence of pulmonic stenosis. Aorta: The aortic root is normal in size and structure. Venous: The inferior vena cava is normal in size with greater than 50% respiratory variability, suggesting right atrial pressure of 3 mmHg. IAS/Shunts: No atrial level shunt detected by color flow Doppler.  LEFT VENTRICLE PLAX 2D LVIDd:         5.00 cm  Diastology LVIDs:         3.50 cm  LV e' lateral:   7.62 cm/s LV PW:  0.70 cm  LV E/e' lateral: 15.4 LV IVS:        0.70 cm  LV e' medial:    6.09 cm/s LVOT diam:     1.50 cm  LV E/e' medial:  19.2 LV SV:         42.76 ml LV SV Index:   23.16 LVOT Area:     1.77 cm  RIGHT VENTRICLE RV S prime:     11.20 cm/s TAPSE (M-mode): 2.0 cm LEFT ATRIUM             Index LA diam:        2.90 cm 1.57 cm/m LA Vol (A2C):   50.5 ml 27.35 ml/m LA Vol (A4C):   53.0 ml 28.70 ml/m LA Biplane Vol: 53.9 ml 29.19 ml/m  AORTIC VALVE LVOT Vmax:   122.00 cm/s LVOT Vmean:  82.400 cm/s LVOT VTI:    0.242 m  AORTA Ao Root diam: 3.40 cm MITRAL VALVE                TRICUSPID VALVE MV Area (PHT): 2.66 cm     TR Peak grad:   25.8 mmHg MV Decel Time: 285 msec     TR Vmax:        254.00 cm/s MV E velocity: 117.00 cm/s MV A velocity: 153.00 cm/s  SHUNTS MV E/A ratio:  0.76         Systemic VTI:  0.24 m                             Systemic Diam: 1.50 cm Fransico Him MD Electronically signed by Fransico Him MD Signature Date/Time: 03/18/2019/2:12:22 PM    Final     Subjective: Eager to go  home  Discharge Exam: Vitals:   03/19/19 1952 03/20/19 0540  BP: 109/60 (!) 104/58  Pulse: 94 89  Resp: 19 20  Temp: 98.4 F (36.9 C) 99.4 F (37.4 C)  SpO2: 93% 90%   Vitals:   03/19/19 1530 03/19/19 1545 03/19/19 1952 03/20/19 0540  BP: (!) 106/57 114/62 109/60 (!) 104/58  Pulse: 93 96 94 89  Resp: 18 16 19 20   Temp: 98.4 F (36.9 C) 98.4 F (36.9 C) 98.4 F (36.9 C) 99.4 F (37.4 C)  TempSrc: Oral Oral Oral Oral  SpO2:  97% 93% 90%  Weight:  74.7 kg  74.4 kg  Height:        General: Pt is alert, awake, not in acute distress Cardiovascular: RRR, S1/S2 +, no rubs, no gallops Respiratory: CTA bilaterally, no wheezing, no rhonchi Abdominal: Soft, NT, ND, bowel sounds + Extremities: no edema, no cyanosis  The results of significant diagnostics from this hospitalization (including imaging, microbiology, ancillary and laboratory) are listed below for reference.     Microbiology: Recent Results (from the past 240 hour(s))  SARS CORONAVIRUS 2 (TAT 6-24 HRS) Nasopharyngeal Nasopharyngeal Swab     Status: None   Collection Time: 03/17/19  4:06 PM   Specimen: Nasopharyngeal Swab  Result Value Ref Range Status   SARS Coronavirus 2 NEGATIVE NEGATIVE Final    Comment: (NOTE) SARS-CoV-2 target nucleic acids are NOT DETECTED. The SARS-CoV-2 RNA is generally detectable in upper and lower respiratory specimens during the acute phase of infection. Negative results do not preclude SARS-CoV-2 infection, do not rule out co-infections with other pathogens, and should not be used as the sole basis for treatment or other patient management decisions. Negative results must  be combined with clinical observations, patient history, and epidemiological information. The expected result is Negative. Fact Sheet for Patients: SugarRoll.be Fact Sheet for Healthcare Providers: https://www.woods-mathews.com/ This test is not yet approved or cleared by  the [place] FDA and  has been authorized for detection and/or diagnosis of SARS-CoV-2 by FDA under an Emergency Use Authorization (EUA). This EUA will remain  in effect (meaning this test can be used) for the duration of the COVID-19 declaration under Section 56 4(b)(1) of the Act, 21 U.S.C. section 360bbb-3(b)(1), unless the authorization is terminated or revoked sooner. Performed at [facility], [place] [address] [place], [place] [address]   MRSA PCR Screening     Status: None   Collection Time: 03/18/19  6:57 AM   Specimen: Nasopharyngeal  Result Value Ref Range Status   MRSA by PCR NEGATIVE NEGATIVE Final    Comment:        The GeneXpert MRSA Assay (FDA approved for NASAL specimens only), is one component of a comprehensive MRSA colonization surveillance program. It is not intended to diagnose MRSA infection nor to guide or monitor treatment for MRSA infections. Performed at [facility], [place] [address], [place],  [address]      Labs: BNP (last 3 results) Recent Labs    08/06/18 1854  BNP 858.8*   Basic Metabolic Panel: Recent Labs  Lab 03/17/19 1347 03/18/19 1029 03/19/19 0534  NA 133* 138 139  K 4.1 4.8 5.0  CL 91* 95* 95*  CO2 27 28 24   GLUCOSE 60* 159* 137*  BUN 51* 57* 72*  CREATININE 7.36* 9.11* 10.61*  CALCIUM 8.8* 8.7* 8.1*  MG 2.2  --   --   PHOS  --  6.8*  --    Liver Function Tests: Recent Labs  Lab 03/17/19 1347 03/18/19 1029  AST 23  --   ALT 21  --   ALKPHOS 86  --   BILITOT 0.4  --   PROT 7.4  --   ALBUMIN 3.5 3.1*   No results for input(s): LIPASE, AMYLASE in the last 168 hours. No results for input(s): AMMONIA in the last 168 hours. CBC: Recent Labs  Lab 03/17/19 1347 03/17/19 1347 03/18/19 0402 03/18/19 2310 03/19/19 0534 03/19/19 1300 03/20/19 0509  WBC 11.7*  --  8.7  --   --  8.4  --   NEUTROABS 9.3*  --   --   --   --   --   --   HGB 7.7*   < > 7.2* 8.4* 8.1* 7.7* 8.7*  HCT 24.1*   < >  22.2* 24.4* 24.7* 23.3* 25.6*  MCV 91.3  --  91.0  --   --  88.9  --   PLT 217  --  217  --   --  206  --    < > = values in this interval not displayed.   Cardiac Enzymes: No results for input(s): CKTOTAL, CKMB, CKMBINDEX, TROPONINI in the last 168 hours. BNP: Invalid input(s): POCBNP CBG: Recent Labs  Lab 03/19/19 1117 03/19/19 1633 03/19/19 2103 03/20/19 0614 03/20/19 1122  GLUCAP 172* 90 147* 134* 127*   D-Dimer No results for input(s): DDIMER in the last 72 hours. Hgb A1c Recent Labs    03/17/19 1817  HGBA1C 7.1*   Lipid Profile No results for input(s): CHOL, HDL, LDLCALC, TRIG, CHOLHDL, LDLDIRECT in the last 72 hours. Thyroid function studies Recent Labs    03/19/19 1109  TSH 4.548*  Anemia work up No results for input(s): VITAMINB12, FOLATE, FERRITIN, TIBC, IRON, RETICCTPCT in the last 72 hours. Urinalysis No results found for: COLORURINE, APPEARANCEUR, [place], [place], GLUCOSEU, [place], [place], [place], PROTEINUR, UROBILINOGEN, NITRITE, LEUKOCYTESUR Sepsis Labs Invalid input(s): PROCALCITONIN,  WBC,  LACTICIDVEN Microbiology Recent Results (from the past 240 hour(s))  SARS CORONAVIRUS 2 (TAT 6-24 HRS) Nasopharyngeal Nasopharyngeal Swab     Status: None   Collection Time: 03/17/19  4:06 PM   Specimen: Nasopharyngeal Swab  Result Value Ref Range Status   SARS Coronavirus 2 NEGATIVE NEGATIVE Final    Comment: (NOTE) SARS-CoV-2 target nucleic acids are NOT DETECTED. The SARS-CoV-2 RNA is generally detectable in upper and lower respiratory specimens during the acute phase of infection. Negative results do not preclude SARS-CoV-2 infection, do not rule out co-infections with other pathogens, and should not be used as the sole basis for treatment or other patient management decisions. Negative results must be combined with clinical observations, patient history, and epidemiological information. The expected result is Negative. Fact Sheet for  Patients: SugarRoll.be Fact Sheet for Healthcare Providers: https://www.woods-mathews.com/ This test is not yet approved or cleared by the [place] FDA and  has been authorized for detection and/or diagnosis of SARS-CoV-2 by FDA under an Emergency Use Authorization (EUA). This EUA will remain  in effect (meaning this test can be used) for the duration of the COVID-19 declaration under Section 56 4(b)(1) of the Act, 21 U.S.C. section 360bbb-3(b)(1), unless the authorization is terminated or revoked sooner. Performed at [facility], [place] [address], [place], [place] [address]   MRSA PCR Screening     Status: None   Collection Time: 03/18/19  6:57 AM   Specimen: Nasopharyngeal  Result Value Ref Range Status   MRSA by PCR NEGATIVE NEGATIVE Final    Comment:        The GeneXpert MRSA Assay (FDA approved for NASAL specimens only), is one component of a comprehensive MRSA colonization surveillance program. It is not intended to diagnose MRSA infection nor to guide or monitor treatment for MRSA infections. Performed at [facility], [place] [address], [place], [place] [address]    Time spent: 30 min  SIGNED:   Marylu Lund, MD  [facility] 03/20/2019, 1:15 PM  If 7PM-7AM, please contact night-coverage

## 2019-04-10 NOTE — Progress Notes (Signed)
CARDIOLOGY OFFICE NOTE  Date:  04/17/2019    Nicholas [name] Date of Birth: Nov 18, 1930 Medical Record #[id number]  PCP:  [facility]  Cardiologist:  Tamala Julian    Chief Complaint  Patient presents with  . Follow-up    Seen for Dr. Tamala Julian    History of Present Illness: Nicholas [name] is a 86 y.o. male who presents today for a follow up visit/post hospital. Seen for Dr. Tamala Julian.   His history is quite complex. He is also followed by the [facility] as well as [facility].   He has a history of known CAD - non obstructive by cath many years ago - treated medically. He also has chronic systolic HF, renal cell carcinoma requiring right nephrectomy in 2018, ESRD on HD TTS that started 12/2016, IDDM, HTN, HLD, & anemia of chronic disease.   He had NSTEMI in June of 2020 treated with LAD orbital atherectomy and stenting per Dr. Irish Lack (noted severe CAD but patient opted for PCI and not CABG). Echo at that time showed wall motion abnormality with EF down to 35 to 40%. This was complicated 2 months later by diverticular bleed that required multiple transfusions - aspirin ultimately discontinued.   He presented again last month with syncope - noted to be hypotensive. Concern for "possible cerebral hypoperfusion from anemia/volume removal with dialysis". This did not happen on a dialysis day. Echo with stable EF noted. No arrhythmia on monitor. To have outpatient event monitor placed. Was noted to have continued issues with bleeding/anemia - to have capsule endoscopy earlier this month thru [place].   The patient does not have symptoms concerning for COVID-19 infection (fever, chills, cough, or new shortness of breath).   Comes in today. Here with his wife Nicholas [name]. He feels like he is doing ok. He did wear his monitor for 14 days. He has not had recurrent syncope. He notes he was sitting at his kitchen table and "everything went black". Was brief - just a few seconds. No chest pain prior. Not short of breath.  No awareness or prodrome - felt fine afterwards. He had another transfusion yesterday of 2 units - says his HGB was down to 7. He was feeling pretty "exhausted prior" but now "much better today". He did have his capsule endoscopy - he tells me this was ok - still unclear where his source of bleeding is. He has not had any palpitations whatsoever. He notes that he will have a feeling in his chest "that feels like ice running". He is basically off all his cardiac medicines. BP continues to run low - especially with dialysis.   Past Medical History:  Diagnosis Date  . Arthritis   . Chronic kidney disease   . Diabetes mellitus ([place])   . Dialysis patient ([place])   . GERD (gastroesophageal reflux disease)    occ  . Heart murmur   . Hypercholesteremia   . Hypertension   . Psoriasis     Past Surgical History:  Procedure Laterality Date  . CARDIAC CATHETERIZATION  09   "some blockage" with collaterals by cath at [facility] ~ 2009; no intervention required; reportedly, no routine cardiology f/u recommended   . CORONARY ATHERECTOMY N/A 08/09/2018   Procedure: CORONARY ATHERECTOMY;  Surgeon: Jettie Booze, MD;  Location: [place] [facility];  Service: Cardiovascular;  Laterality: N/A;  . CORONARY STENT INTERVENTION N/A 08/09/2018   Procedure: CORONARY STENT INTERVENTION;  Surgeon: Jettie Booze, MD;  Location: [place] [facility];  Service:  Cardiovascular;  Laterality: N/A;  . INGUINAL HERNIA REPAIR Right 04/13/2012   Procedure: HERNIA REPAIR INGUINAL INCARCERATED;  Surgeon: Gayland Curry, MD;  Location: [place];  Service: General;  Laterality: Right;  . INSERTION OF MESH Right 04/13/2012   Procedure: INSERTION OF MESH;  Surgeon: Gayland Curry, MD;  Location: [place];  Service: General;  Laterality: Right;  . LEFT HEART CATH N/A 08/09/2018   Procedure: Left Heart Cath;  Surgeon: Jettie Booze, MD;  Location: [place] [facility];  Service: Cardiovascular;  Laterality: N/A;  . LEFT HEART CATH AND  CORONARY ANGIOGRAPHY N/A 08/08/2018   Procedure: LEFT HEART CATH AND CORONARY ANGIOGRAPHY;  Surgeon: Troy Sine, MD;  Location: [place] [facility];  Service: Cardiovascular;  Laterality: N/A;  . VASECTOMY       Medications: Current Meds  Medication Sig  . atorvastatin (LIPITOR) 80 MG tablet Take 80 mg by mouth daily.  . cinacalcet (SENSIPAR) 30 MG tablet Take 30-60 mg by mouth See admin instructions. Take 2 tablets (60 mg) by mouth on Tuesday, Thursday, Saturday (dialysis days), take 1 tablet (30 mg) on Sunday, Monday, Wednesday, Friday (non-dialysis days)  . clopidogrel (PLAVIX) 75 MG tablet Take 1 tablet (75 mg total) by mouth daily with breakfast.  . insulin NPH-regular Human (70-30) 100 UNIT/ML injection Inject 20 Units into the skin See admin instructions. Inject 20 units subcutaneously in the morning as needed for CBG >140, inject 20 units every evening  . lidocaine-prilocaine (EMLA) cream Apply 1 application topically See admin instructions. Apply topically 30 minutes prior to dialysis on Tuesday, Thursday, Saturday  . nitroGLYCERIN (NITROSTAT) 0.4 MG SL tablet Place 1 tablet (0.4 mg total) under the tongue every 5 (five) minutes x 3 doses as needed for chest pain.  . Nutritional Supplements (NEPRO/CARBSTEADY PO) Take 237 mLs by mouth daily. vanilla  . [DISCONTINUED] atorvastatin (LIPITOR) 80 MG tablet Take 1 tablet (80 mg total) by mouth daily at 6 PM.     Allergies: Allergies  Allergen Reactions  . Hydrochlorothiazide Other (See Comments)    [facility]    Social History: The patient  reports that he quit smoking about 32 years ago. His smoking use included cigarettes. He has a 15.00 pack-year smoking history. He has never used smokeless tobacco. He reports that he does not drink alcohol or use drugs.   Family History: The patient's family history includes Diabetes in his brother, mother, and sister; Stroke in his father.   Review of Systems: Please see the  history of present illness.   All other systems are reviewed and negative.   Physical Exam: VS:  BP 100/60   Pulse 86   Ht 5\' 7"  (1.702 m)   Wt 170 lb (77.1 kg)   SpO2 96%   BMI 26.63 kg/m  .  BMI Body mass index is 26.63 kg/m.  Wt Readings from Last 3 Encounters:  04/17/19 170 lb (77.1 kg)  03/20/19 164 lb 1.6 oz (74.4 kg)  11/07/18 164 lb (74.4 kg)    General: Alert. He looks much younger than his stated age. He is in no acute distress.  He is in a wheelchair.  HEENT: Normal.  Neck: Supple, no JVD, carotid bruits, or masses noted.  Cardiac: Regular rate and rhythm. No significant murmur noted. No edema.  Respiratory:  Lungs are clear to auscultation bilaterally with normal work of breathing.  GI: Soft and nontender.  MS: No deformity or atrophy. Gait not tested.  Skin:  Warm and dry. Color is normal.  Neuro:  Strength and sensation are intact and no gross focal deficits noted.  Psych: Alert, appropriate and with normal affect.   LABORATORY DATA:  EKG:  EKG is not ordered today.   Lab Results  Component Value Date   WBC 8.4 03/19/2019   HGB 8.7 (L) 03/20/2019   HCT 25.6 (L) 03/20/2019   PLT 206 03/19/2019   GLUCOSE 149 (H) 03/20/2019   CHOL 202 (H) 08/06/2018   TRIG 200 (H) 08/06/2018   HDL 68 08/06/2018   LDLCALC NOT CALCULATED 08/06/2018   ALT 21 03/17/2019   AST 23 03/17/2019   NA 138 03/20/2019   K 4.9 03/20/2019   CL 94 (L) 03/20/2019   CREATININE 6.40 (H) 03/20/2019   BUN 28 (H) 03/20/2019   CO2 30 03/20/2019   TSH 4.548 (H) 03/19/2019   INR 1.0 03/18/2019   HGBA1C 7.1 (H) 03/17/2019     BNP (last 3 results) Recent Labs    08/06/18 1854  BNP 662.8*    ProBNP (last 3 results) No results for input(s): PROBNP in the last 8760 hours.   Other Studies Reviewed Today:  ECHO IMPRESSIONS 02/2019  1. Left ventricular ejection fraction, by estimation, is 60 to 65%. The  left ventricle has normal function. The left ventricle has no regional  wall  motion abnormalities. Left ventricular diastolic parameters are  consistent with Grade I diastolic  dysfunction (impaired relaxation). Elevated left ventricular end-diastolic  pressure.  2. Right ventricular systolic function is normal. The right ventricular  size is normal. There is normal pulmonary artery systolic pressure.  3. The mitral valve is normal in structure and function. No evidence of  mitral valve regurgitation. No evidence of mitral stenosis.  4. The aortic valve is tricuspid. Aortic valve regurgitation is mild.  Mild aortic valve sclerosis is present, with no evidence of aortic valve  stenosis.  5. The inferior vena cava is normal in size with greater than 50%  respiratory variability, suggesting right atrial pressure of 3 mmHg.    CORONARY STENT INTERVENTION 08/09/2018  CORONARY ATHERECTOMY  Left Heart Cath  Conclusion    Prox LAD lesion is 90% stenosed.  A drug-eluting stent was successfully placed using a STENT SYNERGY DES 3.5X16.  1st Diag lesion is 80% stenosed and jailed by the stent. TIMIT 1 flow.  1st Mrg lesion is 100% stenosed.  Prox Cx lesion is 80% stenosed.  Post intervention, there is a 0% residual stenosis.  LV end diastolic pressure is normal.  There is no aortic valve stenosis.   Continue clopidogrel along with aggressive secondary prevention.    Dialysis later today.       Assessment/Plan:  1. Syncope - of unclear etiology - his blood pressure is chronically low. He is chronically anemic - has continued to require transfusions. His preliminary monitor showed minimum HR 66, max 279 with average of 93. There were runs of VT (longest 5 beats) and multiple short spells of SVT. His beta blocker was stopped during his February admission - due to hypotension.   2. Known CAD - NSTEMI back in June/July with severe CAD noted - patient opted for PCI - has had LAD atherectomy. Remains just on Plavix as monotherapy due to issues with  bleeding/anemia. He has had some intermittent chest pain - but says he is still improved from where he was last summer - will need to follow closely - unfortunately, options are very limited.   3. ESRD -  on HD  4. Chronic combined systolic and diastolic HF - most recent echo showed his EF to have improved.   5. Chronic anemia - he has continued to require transfusions - total etiology unclear but has had noted gastric AVMS and cecal angiodysplasia noted in the record - sounds like capsule endoscopy was ok. He is transfused periodically. Unclear if Plavix is playing a role. Anemia was present back in 2019 (event prior to PCI) and led to transfusion then. Unclear if seeing hematology - will defer to his primary team. Discussed in depth with Dr. Tamala Julian here in the office this morniing regarding the continuation of Plavix - at this time - it is felt that we need to try to get 12 months of therapy if possible.   6. DM - per PCP  7. COVID-19 Education: The signs and symptoms of COVID-19 were discussed with the patient and how to seek care for testing (follow up with PCP or arrange E-visit).  The importance of social distancing, staying at home, hand hygiene and wearing a mask when out in public were discussed today. They have both been vaccinated.   Current medicines are reviewed with the patient today.  The patient does not have concerns regarding medicines other than what has been noted above.  The following changes have been made:  See above.  Labs/ tests ordered today include:    Orders Placed This Encounter  Procedures  . Ambulatory referral to Cardiac Electrophysiology     Disposition:   FU with [place] in June. Long discussion with Dr. Tamala Julian regarding the patient's care - Dr. Tamala Julian has reviewed cath films this morning and has advised that we try for 12 months of total therapy of Plavix if at all possible. In regards to the monitor findings - we will refer to EP for ILR implant to look for  longer runs of VT as well as pauses. For now, no change in medicines. Will NOT add back beta blocker at this time. Overall situation quite tenuous.    Patient is agreeable to this plan and will call if any problems develop in the interim.   SignedTruitt Merle, NP  04/17/2019 12:51 PM  [facility] [address] [place], [place]  [address] Phone: [phone] Fax: [phone]

## 2019-04-17 ENCOUNTER — Ambulatory Visit (INDEPENDENT_AMBULATORY_CARE_PROVIDER_SITE_OTHER): Payer: Medicare Other | Admitting: Nurse Practitioner

## 2019-04-17 ENCOUNTER — Encounter: Payer: Self-pay | Admitting: Nurse Practitioner

## 2019-04-17 ENCOUNTER — Other Ambulatory Visit: Payer: Self-pay

## 2019-04-17 VITALS — BP 100/60 | HR 86 | Ht 67.0 in | Wt 170.0 lb

## 2019-04-17 DIAGNOSIS — I5042 Chronic combined systolic (congestive) and diastolic (congestive) heart failure: Secondary | ICD-10-CM | POA: Diagnosis not present

## 2019-04-17 DIAGNOSIS — I25110 Atherosclerotic heart disease of native coronary artery with unstable angina pectoris: Secondary | ICD-10-CM | POA: Diagnosis not present

## 2019-04-17 DIAGNOSIS — N186 End stage renal disease: Secondary | ICD-10-CM

## 2019-04-17 DIAGNOSIS — Z7189 Other specified counseling: Secondary | ICD-10-CM

## 2019-04-17 DIAGNOSIS — Z992 Dependence on renal dialysis: Secondary | ICD-10-CM

## 2019-04-17 DIAGNOSIS — D500 Iron deficiency anemia secondary to blood loss (chronic): Secondary | ICD-10-CM

## 2019-04-17 DIAGNOSIS — I214 Non-ST elevation (NSTEMI) myocardial infarction: Secondary | ICD-10-CM | POA: Diagnosis not present

## 2019-04-17 DIAGNOSIS — R55 Syncope and collapse: Secondary | ICD-10-CM

## 2019-04-17 NOTE — Patient Instructions (Addendum)
After Visit Summary:  We will be checking the following labs today - NONE   Medication Instructions:    Continue with your current medicines.    If you need a refill on your cardiac medications before your next appointment, please call your pharmacy.     Testing/Procedures To Be Arranged:  N/A  Follow-Up:   See Dr. Tamala Julian in June  We are going to get you a visit with EP to discuss loop recorder implantation    At [facility], you and your health needs are our priority.  As part of our continuing mission to provide you with exceptional heart care, we have created designated Provider Care Teams.  These Care Teams include your primary Cardiologist (physician) and Advanced Practice Providers (APPs -  Physician Assistants and Nurse Practitioners) who all work together to provide you with the care you need, when you need it.  Special Instructions:  . Stay safe, stay home, wash your hands for at least 20 seconds and wear a mask when out in public.  . It was good to talk with you both today.    Call the [place] office at [phone] if you have any questions, problems or concerns.

## 2019-05-06 ENCOUNTER — Other Ambulatory Visit: Payer: Self-pay

## 2019-05-06 ENCOUNTER — Ambulatory Visit (INDEPENDENT_AMBULATORY_CARE_PROVIDER_SITE_OTHER): Payer: Medicare Other | Admitting: Internal Medicine

## 2019-05-06 ENCOUNTER — Encounter: Payer: Self-pay | Admitting: Internal Medicine

## 2019-05-06 VITALS — BP 114/56 | HR 86 | Ht 67.0 in | Wt 168.0 lb

## 2019-05-06 DIAGNOSIS — R55 Syncope and collapse: Secondary | ICD-10-CM | POA: Diagnosis not present

## 2019-05-06 DIAGNOSIS — I5022 Chronic systolic (congestive) heart failure: Secondary | ICD-10-CM

## 2019-05-06 MED ORDER — ISOSORBIDE DINITRATE 20 MG PO TABS: 20.0000 mg | ORAL_TABLET | Freq: Every day | ORAL | 3 refills | Status: DC

## 2019-05-06 NOTE — Patient Instructions (Signed)
Medication Instructions: Your physician has recommended you make the following change in your medication:  Begin taking Isosrbide Dinitrate (Isosordil) 20mg  - 1 tablet at bedtime.   *If you need a refill on your cardiac medications before your next appointment, please call your pharmacy*   Lab Work: None ordered.  If you have labs (blood work) drawn today and your tests are completely normal, you will receive your results only by: Marland Kitchen MyChart Message (if you have MyChart) OR . A paper copy in the mail If you have any lab test that is abnormal or we need to change your treatment, we will call you to review the results.   Testing/Procedures: Your physician has recommended that you have an internal loop recorder inserted.   Follow-Up: At [facility], you and your health needs are our priority.  As part of our continuing mission to provide you with exceptional heart care, we have created designated Provider Care Teams.  These Care Teams include your primary Cardiologist (physician) and Advanced Practice Providers (APPs -  Physician Assistants and Nurse Practitioners) who all work together to provide you with the care you need, when you need it.  We recommend signing up for the patient portal called "MyChart".  Sign up information is provided on this After Visit Summary.  MyChart is used to connect with patients for Virtual Visits (Telemedicine).  Patients are able to view lab/test results, encounter notes, upcoming appointments, etc.  Non-urgent messages can be sent to your provider as well.   To learn more about what you can do with MyChart, go to [email or web address].    Your next appointment:   05/21/2019 at 9am  The format for your next appointment:   Virtual  Provider:   Dr Caryl Comes   Other [place], Adult Taking care of your wound properly can help to prevent pain, infection, and scarring. It can also help your wound to heal more quickly.  How to care for  your wound Wound care      Follow instructions from your health care provider about how to take care of your wound. Make sure you: ? Wash your hands with soap and water before you change the bandage (dressing). If soap and water are not available, use hand sanitizer. ? Change your dressing as told by your health care provider. ? Leave  adhesive strips in place. These skin closures may need to stay in place for 2 weeks or longer. If adhesive strip edges start to loosen and curl up, you may trim the loose edges. Do not remove adhesive strips completely unless your health care provider tells you to do that.  Check your wound area every day for signs of infection. Check for: ? Redness, swelling, or pain. ? Fluid or blood. ? Warmth. ? Pus or a bad smell.  Ask your health care provider if you should clean the wound with mild soap and water. Doing this may include: ? Using a clean towel to pat the wound dry after cleaning it. Do not rub or scrub the wound. ? Covering the incision with a clean dressing.  Ask your health care provider when you can leave the wound uncovered.  Keep the dressing dry until your health care provider says it can be removed. Do not take baths, swim, use a hot tub, or do anything that would put the wound underwater until your health care provider approves. Ask your health care provider if you can take showers. You may only be allowed to take  sponge baths.  You may shower tomorrow night with the large bandage on.  Remove the large bandage on Friday 05/10/2019. Medicines  Take over-the-counter and prescription medicines only as told by your health care provider. If you were prescribed pain medicine, take it 30 or more minutes before you do any wound care or as told by your health care provider. General instructions  Return to your normal activities as told by your health care provider. Ask your health care provider what activities are safe.  Do not scratch or pick at  the wound.  Do not use any products that contain nicotine or tobacco, such as cigarettes and e-cigarettes. These may delay wound healing. If you need help quitting, ask your health care provider.  Keep all follow-up visits as told by your health care provider. This is important.  Eat a diet that includes protein, vitamin A, vitamin C, and other nutrient-rich foods to help the wound heal. ? Foods rich in protein include meat, dairy, beans, nuts, and other sources. ? Foods rich in vitamin A include carrots and dark green, leafy vegetables. ? Foods rich in vitamin C include citrus, tomatoes, and other fruits and vegetables. ? Nutrient-rich foods have protein, carbohydrates, fat, vitamins, or minerals. Eat a variety of healthy foods including vegetables, fruits, and whole grains. Contact a health care provider if:  You received a tetanus shot and you have swelling, severe pain, redness, or bleeding at the injection site.  Your pain is not controlled with medicine.  You have redness, swelling, or pain around the wound.  You have fluid or blood coming from the wound.  Your wound feels warm to the touch.  You have pus or a bad smell coming from the wound.  You have a fever or chills.  You are nauseous or you vomit.  You are dizzy. Get help right away if:  You have a red streak going away from your wound.  The edges of the wound open up and separate.  Your wound is bleeding, and the bleeding does not stop with gentle pressure.  You have a rash.  You faint.  You have trouble breathing. Summary  Always wash your hands with soap and water before changing your bandage (dressing).  To help with healing, eat foods that are rich in protein, vitamin A, vitamin C, and other nutrients.  Check your wound every day for signs of infection. Contact your health care provider if you suspect that your wound is infected. This information is not intended to replace advice given to you by your  health care provider. Make sure you discuss any questions you have with your health care provider. Document Revised: 04/30/2018 Document Reviewed: 07/28/2015 Elsevier Patient Education  [place].

## 2019-05-06 NOTE — Progress Notes (Signed)
[place]      ELECTROPHYSIOLOGY CONSULT NOTE  Patient ID: Nicholas [name], MRN: [id number], DOB/AGE: Aug 29, 1930 86 y.o. Admit date: (Not on file) Date of Consult: 05/06/2019  Primary Physician: [facility] Primary Cardiologist: [facility]     Nicholas [name] is a 86 y.o. male who is being seen today for the evaluation of syncope at the request of [facility].    HPI Nicholas [name] is a 86 y.o. male seen for syncope in the context of end-stage renal disease on dialysis   History of ischemic heart disease presenting 2020 with non-STEMI and LAD disease.  Underwent PCI.  Course complicated by diverticular bleed and aspirin discontinued  3 episodes of syncope--have been similar but not identical.  The most recent episode occurred on a nondialysis day, he was sitting at the table his wife turned away having given him his food.  He lost consciousness his head went against the table and by the time she had come back to the table he was awake.  She was aware for just a moment of a prodrome which was difficult for him to characterize.  Another episode occurred a month or 2 before.  He was getting ready for bed and was sitting in a chair about to get undressed.  He again had a warning of just a few seconds.  Lost consciousness for just a moment then went and was awake.  It was his impression that the prodrome was distinct from the previous episode.  He does not recall whether it was a dialysis day or not.  The details of the third episode neither he nor his wife remember.  He has some orthostasis on dialysis days.  He has been awakening almost nightly with discomfort-chest described as a pressure.  It is relieved almost immediately with nitroglycerin.  Does not have daytime angina.  His catheterization report is notable for residual circumflex disease with an 80% proximal lesion a total marginal and stenting of his D1.   DATE TEST EF   7//20 Echo  35-40%         Zio patch demonstrated nonsustained  ventricular tachycardia and nonsustained ventricular tachycardia; there are some artifact Past Medical History:  Diagnosis Date  . Arthritis   . Chronic kidney disease   . Diabetes mellitus ([place])   . Dialysis patient ([place])   . GERD (gastroesophageal reflux disease)    occ  . Heart murmur   . Hypercholesteremia   . Hypertension   . Psoriasis       Surgical History:  Past Surgical History:  Procedure Laterality Date  . CARDIAC CATHETERIZATION  09   "some blockage" with collaterals by cath at [facility] ~ 2009; no intervention required; reportedly, no routine cardiology f/u recommended   . CORONARY ATHERECTOMY N/A 08/09/2018   Procedure: CORONARY ATHERECTOMY;  Surgeon: Jettie Booze, MD;  Location: [place] [facility];  Service: Cardiovascular;  Laterality: N/A;  . CORONARY STENT INTERVENTION N/A 08/09/2018   Procedure: CORONARY STENT INTERVENTION;  Surgeon: Jettie Booze, MD;  Location: [place] [facility];  Service: Cardiovascular;  Laterality: N/A;  . INGUINAL HERNIA REPAIR Right 04/13/2012   Procedure: HERNIA REPAIR INGUINAL INCARCERATED;  Surgeon: Gayland Curry, MD;  Location: [place];  Service: General;  Laterality: Right;  . INSERTION OF MESH Right 04/13/2012   Procedure: INSERTION OF MESH;  Surgeon: Gayland Curry, MD;  Location: [place];  Service: General;  Laterality: Right;  . LEFT HEART CATH N/A 08/09/2018   Procedure: Left Heart  Cath;  Surgeon: Jettie Booze, MD;  Location: [place] [facility];  Service: Cardiovascular;  Laterality: N/A;  . LEFT HEART CATH AND CORONARY ANGIOGRAPHY N/A 08/08/2018   Procedure: LEFT HEART CATH AND CORONARY ANGIOGRAPHY;  Surgeon: Troy Sine, MD;  Location: [place] [facility];  Service: Cardiovascular;  Laterality: N/A;  . VASECTOMY       Home Meds: Current Meds  Medication Sig  . atorvastatin (LIPITOR) 80 MG tablet Take 80 mg by mouth daily.  . cinacalcet (SENSIPAR) 30 MG tablet Take 30-60 mg by mouth See admin instructions. Take 2  tablets (60 mg) by mouth on Tuesday, Thursday, Saturday (dialysis days), take 1 tablet (30 mg) on Sunday, Monday, Wednesday, Friday (non-dialysis days)  . clopidogrel (PLAVIX) 75 MG tablet Take 1 tablet (75 mg total) by mouth daily with breakfast.  . insulin NPH-regular Human (70-30) 100 UNIT/ML injection Inject 20 Units into the skin See admin instructions. Inject 20 units subcutaneously in the morning as needed for CBG >140, inject 20 units every evening  . lidocaine-prilocaine (EMLA) cream Apply 1 application topically See admin instructions. Apply topically 30 minutes prior to dialysis on Tuesday, Thursday, Saturday  . nitroGLYCERIN (NITROSTAT) 0.4 MG SL tablet Place 1 tablet (0.4 mg total) under the tongue every 5 (five) minutes x 3 doses as needed for chest pain.  . Nutritional Supplements (NEPRO/CARBSTEADY PO) Take 237 mLs by mouth daily. vanilla    Allergies:  Allergies  Allergen Reactions  . Hydrochlorothiazide Other (See Comments)    Unknown [facility] records    Social History   Socioeconomic History  . Marital status: Married    Spouse name: Not on file  . Number of children: Not on file  . Years of education: Not on file  . Highest education level: Not on file  Occupational History  . Not on file  Tobacco Use  . Smoking status: Former Smoker    Packs/day: 1.00    Years: 15.00    Pack years: 15.00    Types: Cigarettes    Quit date: 03/29/1987    Years since quitting: 32.1  . Smokeless tobacco: Never Used  Substance and Sexual Activity  . Alcohol use: No  . Drug use: No  . Sexual activity: Not on file  Other Topics Concern  . Not on file  Social History Narrative  . Not on file   Social Determinants of Health   Financial Resource Strain:   . Difficulty of Paying Living Expenses:   Food Insecurity:   . Worried About Charity fundraiser in the Last Year:   . Arboriculturist in the Last Year:   Transportation Needs:   . Film/video editor (Medical):   Marland Kitchen Lack  of Transportation (Non-Medical):   Physical Activity:   . Days of Exercise per Week:   . Minutes of Exercise per Session:   Stress:   . Feeling of Stress :   Social Connections:   . Frequency of Communication with Friends and Family:   . Frequency of Social Gatherings with Friends and Family:   . Attends Religious Services:   . Active Member of Clubs or Organizations:   . Attends Archivist Meetings:   Marland Kitchen Marital Status:   Intimate Partner Violence:   . Fear of Current or Ex-Partner:   . Emotionally Abused:   Marland Kitchen Physically Abused:   . Sexually Abused:      Family History  Problem Relation Age of Onset  .  Diabetes Mother   . Stroke Father   . Diabetes Sister   . Diabetes Brother      ROS:  Please see the history of present illness.     All other systems reviewed and negative.    Physical Exam: Blood pressure (!) 114/56, pulse 86, height 5\' 7"  (1.702 m), weight 168 lb (76.2 kg), SpO2 97 %. General: Well developed, well nourished male in no acute distress. Head: Normocephalic, atraumatic, sclera non-icteric, no xanthomas, nares are without discharge. EENT: normal  Lymph Nodes:  none Neck: Negative for carotid bruits. JVD not elevated. Back:without scoliosis kyphosis Lungs: Clear bilaterally to auscultation without wheezes, rales, or rhonchi. Breathing is unlabored. Heart: RRR with S1 S2. 2/6 systolic murmur . No rubs, or gallops appreciated. Abdomen: Soft, non-tender, non-distended with normoactive bowel sounds. No hepatomegaly. No rebound/guarding. No obvious abdominal masses. Msk:  Strength and tone appear normal for age. Extremities: No clubbing or cyanosis. No edema.  Distal pedal pulses are 2+ and equal bilaterally. Skin: Warm and Dry Neuro: Alert and oriented X 3. CN III-XII intact Grossly normal sensory and motor function . Psych:  Responds to questions appropriately with a normal affect.      Labs: Cardiac Enzymes No results for input(s): CKTOTAL,  CKMB, TROPONINI in the last 72 hours. CBC Lab Results  Component Value Date   WBC 8.4 03/19/2019   HGB 8.7 (L) 03/20/2019   HCT 25.6 (L) 03/20/2019   MCV 88.9 03/19/2019   PLT 206 03/19/2019   PROTIME: No results for input(s): LABPROT, INR in the last 72 hours. Chemistry No results for input(s): NA, K, CL, CO2, BUN, CREATININE, CALCIUM, PROT, BILITOT, ALKPHOS, ALT, AST, GLUCOSE in the last 168 hours.  Invalid input(s): LABALBU Lipids Lab Results  Component Value Date   CHOL 202 (H) 08/06/2018   HDL 68 08/06/2018   LDLCALC NOT CALCULATED 08/06/2018   TRIG 200 (H) 08/06/2018   BNP No results found for: PROBNP Thyroid Function Tests: No results for input(s): TSH, T4TOTAL, T3FREE, THYROIDAB in the last 72 hours.  Invalid input(s): FREET3 Miscellaneous No results found for: DDIMER  Radiology/Studies:  No results found.  EKG: sinus @ 86 19/09/37 PRWP  Assessment and Plan:  Syncope abrupt in onset and offset  End-stage renal disease TTS  Coronary artery disease with LAD stenting and residual circumflex and diagonal disease  Ventricular tachycardia-nonsustained  Angina decubitus    The patient has abrupt syncope.  Not withstanding his history of dialysis and its associated hypotension the abrupt onset and offset of his syncope speaks much more directly towards an arrhythmia as a mechanism.  Moreover, it suggests a  bradyarrhythmia this, not withstanding the lack of significant conduction disease as suggested by his ECG.  Interestingly, his Holter monitor suggested more PR prolongation that is evident today; perhaps is variable.  I have discussed with Dr. Tamala Julian and the patient and his wife the role of an implantable loop recorder to try to discern the mechanism of his syncope; all are agreeable.  He also has nocturnal chest discomfort.  It is relieved by nitroglycerin.  He is an uric; hence we do not have to worry about nocturnal orthostasis.  Having reviewed with Dr.  Rebecca Eaton we will put him on isosorbide dinitrate 20 mg at bedtime.   Nicholas [name] [id number]  [id number]  Pre op Dx .syncop Post op Dx Same  Procedure  Loop Recorder implantation  After routine prep and drape of the left parasternal area, a small incision was created.  A [employer] LINQ Reveal Loop Recorder  Serial Number  [id number] was inserted.    SteriStrip dressing was  applied.  The patient tolerated the procedure without apparent complication.  EBL < 10cc      Virl Axe, MD 05/06/2019 6:42 PM     Virl Axe

## 2019-05-07 ENCOUNTER — Telehealth: Payer: Self-pay | Admitting: Internal Medicine

## 2019-05-07 NOTE — Telephone Encounter (Signed)
New message   Patient states that he has a question about isosorbide dinitrate (ISORDIL) 20 MG tablet. He did not state what the question was. Please call.

## 2019-05-08 ENCOUNTER — Telehealth: Payer: Self-pay | Admitting: Internal Medicine

## 2019-05-08 NOTE — Telephone Encounter (Signed)
New Message °

## 2019-05-09 NOTE — Telephone Encounter (Signed)
Spoke with pt who states he is enrolled in cardiac rehab on Monday's and Wednesday's.  Pt states he was not allowed to exercise yesterday as he has to wear a heart monitor there and staff was unsure if that would interfere with his ILR.  Pt asking RN to contact rehab to discuss; however pt does not have phone number.  Pt will call back with rehab phone number.  Reviewed instructions with pt's wife to remove bandage tomorrow (Friday) and leave steri-strips to fall off on their own.  Pt and wife verbalize understanding.  Confirmed with pt Rx for Isordil was sent to [facility] and was received.  Pt will contact pharmacy.  Pt verbalizes understanding and agrees with current plan.

## 2019-05-10 ENCOUNTER — Telehealth: Payer: Self-pay | Admitting: Internal Medicine

## 2019-05-10 NOTE — Telephone Encounter (Signed)
Form faxed 05/10/19.

## 2019-05-10 NOTE — Telephone Encounter (Signed)
Nicholas [name] is calling for medical clearance for the patient to continue rehab with their office. She states she faxed over a form regarding it on 05/08/19, but never heard anything back. The patient had a loop monitor put in last week and they were wanting to make sure he was safe to continue with exercise. Please advise.

## 2019-05-13 NOTE — Telephone Encounter (Signed)
Patient calling back.   °

## 2019-05-13 NOTE — Telephone Encounter (Signed)
Spoke with pt who states [facility] is requiring Isosrdil Rx to be re-written by his PCP in order to be a coverd medication.  Pt advised Rx was sent to [facility] and confirmed on 05/06/2019.  Pt advised to contact his PCP re re-writing Rx for coverage.  Pt also states his cardiac rehab did receive confirmation he may participate in activity.  Pt verbalizes understanding and agrees with current plan.

## 2019-05-14 ENCOUNTER — Other Ambulatory Visit: Payer: Self-pay | Admitting: Internal Medicine

## 2019-05-14 NOTE — Telephone Encounter (Signed)
*  STAT* If patient is at the pharmacy, call can be transferred to refill team.   1. Which medications need to be refilled? (please list name of each medication and dose if known) isosorbide dinitrate (ISORDIL) 20 MG tablet  2. Which pharmacy/location (including street and city if local pharmacy) is medication to be sent to? [place], [address] [place]  Fax number: [phone]  0. Do they need a 30 day or 90 day supply? 90  Patient is out of medication. States the [place] did not receive the prescription. He would like a call once it has been faxed.

## 2019-05-15 ENCOUNTER — Other Ambulatory Visit: Payer: Self-pay

## 2019-05-15 MED ORDER — ISOSORBIDE DINITRATE 20 MG PO TABS: 20.0000 mg | ORAL_TABLET | Freq: Every day | ORAL | 11 refills | Status: DC

## 2019-05-15 NOTE — Telephone Encounter (Signed)
Pt's medication was resent as a fax to [facility] at Fax# [phone], confirmation received.

## 2019-05-20 ENCOUNTER — Ambulatory Visit: Payer: Medicare Other | Admitting: Interventional Cardiology

## 2019-05-21 ENCOUNTER — Ambulatory Visit: Payer: TRICARE For Life (TFL)

## 2019-06-03 ENCOUNTER — Telehealth: Payer: Self-pay | Admitting: Internal Medicine

## 2019-06-03 NOTE — Telephone Encounter (Signed)
Spoke with pt  Who states he did not receive a phone call for his virtual wound check on 05/21/2019.  Pt's virtual wound check rescheduled for 06/04/2019 at 8 am.  Pt verbalizes understanding and agrees with current plan.

## 2019-06-03 NOTE — Telephone Encounter (Signed)
Patient called and said he was not notified about his appointment on 04-27 and was marked as a no show. He would like Dr. Olin Pia Nurse to call him and tell him why he never had his appointment

## 2019-06-04 ENCOUNTER — Telehealth (INDEPENDENT_AMBULATORY_CARE_PROVIDER_SITE_OTHER): Payer: Medicare Other | Admitting: *Deleted

## 2019-06-04 ENCOUNTER — Other Ambulatory Visit: Payer: Self-pay

## 2019-06-04 NOTE — Progress Notes (Signed)
Patient had removed steri strips prior to visit, incision without drainage, edges well approximated. Visual findings confirmed by patient and wife. No fever or chills or any other signs of infection. Monitor transmitting. 11 symptom episodes recorded since 05/25/19, that appear to be SR. Patient reports noting symptoms when he had episodes of Chest Pressure that was relieved by sl Nitro x 1. He reports no nause, no diaphoresis, pressure described as substernal, non-radiating and no new occurrence for patient. Patient has follow up with Dr Tamala Julian 07/19/19. Patient given ED precautions for CP or chest pressure that is unrelieved with Nitro SL x 2 or worsening cardiac symptoms.

## 2019-06-06 ENCOUNTER — Ambulatory Visit: Payer: TRICARE For Life (TFL)

## 2019-06-10 ENCOUNTER — Ambulatory Visit (INDEPENDENT_AMBULATORY_CARE_PROVIDER_SITE_OTHER): Payer: Medicare Other | Admitting: *Deleted

## 2019-06-10 DIAGNOSIS — R55 Syncope and collapse: Secondary | ICD-10-CM

## 2019-06-10 LAB — CUP PACEART REMOTE DEVICE CHECK
Date Time Interrogation Session: 20210516013736
Implantable Pulse Generator Implant Date: 20210412

## 2019-06-11 NOTE — Progress Notes (Signed)
[facility] Summary Report / Loop Recorder 

## 2019-07-11 NOTE — Progress Notes (Signed)
Cardiology Office Note:    Date:  07/19/2019   ID:  Nicholas [name], DOB [birth year], MRN [id number]  PCP:  [facility]  Cardiologist:  Sinclair Grooms, MD   Referring MD: [facility]   Chief Complaint  Patient presents with  . Coronary Artery Disease    Angina pectoris    History of Present Illness:    Nicholas [name] is a 86 y.o. male with a hx of CAD, chronic systolic CHF,ESRD on HD TTS, IDT2DM, HTN, HLD, anemia of chronic disease requiring transfusion.  Non-ST elevation MI June 2020 ultimately treated LAD with orbital atherectomy and stenting (Dr. Saundra Shelling). Since event has has recurrent syncope and now has loop recorder.  He has angina postdialysis and at other times if he is too vigorous.  Rest and sublingual nitroglycerin relieves the discomfort.  He uses nitroglycerin between 10 and 12 times per week.  He has had no recurrence of syncope.  He does not drink anywhere near the 32 ounces of fluid per day that is recommended/prescribed by nephrology as the limit to avoid fluid overload.  He occasionally awakens from sleep with discomfort in the chest that is relieved by nitroglycerin.  Past Medical History:  Diagnosis Date  . Arthritis   . Chronic kidney disease   . Diabetes mellitus ([place])   . Dialysis patient ([place])   . GERD (gastroesophageal reflux disease)    occ  . Heart murmur   . Hypercholesteremia   . Hypertension   . Psoriasis     Past Surgical History:  Procedure Laterality Date  . CARDIAC CATHETERIZATION  09   "some blockage" with collaterals by cath at [facility] ~ 2009; no intervention required; reportedly, no routine cardiology f/u recommended   . CORONARY ATHERECTOMY N/A 08/09/2018   Procedure: CORONARY ATHERECTOMY;  Surgeon: Jettie Booze, MD;  Location: [place] [facility];  Service: Cardiovascular;  Laterality: N/A;  . CORONARY STENT INTERVENTION N/A 08/09/2018   Procedure: CORONARY STENT INTERVENTION;  Surgeon: Jettie Booze, MD;   Location: [place] [facility];  Service: Cardiovascular;  Laterality: N/A;  . INGUINAL HERNIA REPAIR Right 04/13/2012   Procedure: HERNIA REPAIR INGUINAL INCARCERATED;  Surgeon: Gayland Curry, MD;  Location: [place];  Service: General;  Laterality: Right;  . INSERTION OF MESH Right 04/13/2012   Procedure: INSERTION OF MESH;  Surgeon: Gayland Curry, MD;  Location: [place];  Service: General;  Laterality: Right;  . LEFT HEART CATH N/A 08/09/2018   Procedure: Left Heart Cath;  Surgeon: Jettie Booze, MD;  Location: [place] [facility];  Service: Cardiovascular;  Laterality: N/A;  . LEFT HEART CATH AND CORONARY ANGIOGRAPHY N/A 08/08/2018   Procedure: LEFT HEART CATH AND CORONARY ANGIOGRAPHY;  Surgeon: Troy Sine, MD;  Location: [place] [facility];  Service: Cardiovascular;  Laterality: N/A;  . VASECTOMY      Current Medications: Current Meds  Medication Sig  . atorvastatin (LIPITOR) 80 MG tablet Take 80 mg by mouth daily.  . cinacalcet (SENSIPAR) 30 MG tablet Take 30-60 mg by mouth See admin instructions. Take 2 tablets (60 mg) by mouth on Tuesday, Thursday, Saturday (dialysis days), take 1 tablet (30 mg) on Sunday, Monday, Wednesday, Friday (non-dialysis days)  . clopidogrel (PLAVIX) 75 MG tablet Take 1 tablet (75 mg total) by mouth daily with breakfast.  . insulin NPH-regular Human (70-30) 100 UNIT/ML injection Inject 20 Units into the skin See admin instructions. Inject 20 units subcutaneously in the morning as needed  for CBG >140, inject 20 units every evening  . isosorbide dinitrate (ISORDIL) 20 MG tablet Take 1 tablet (20 mg total) by mouth at bedtime.  . lidocaine-prilocaine (EMLA) cream Apply 1 application topically See admin instructions. Apply topically 30 minutes prior to dialysis on Tuesday, Thursday, Saturday  . nitroGLYCERIN (NITROSTAT) 0.4 MG SL tablet Place 1 tablet (0.4 mg total) under the tongue every 5 (five) minutes x 3 doses as needed for chest pain.  . Nutritional  Supplements (NEPRO/CARBSTEADY PO) Take 237 mLs by mouth daily. vanilla  . sevelamer carbonate (RENVELA) 800 MG tablet Take 1,600 mg by mouth 3 (three) times daily.     Allergies:   Hydrochlorothiazide   Social History   Socioeconomic History  . Marital status: Married    Spouse name: Not on file  . Number of children: Not on file  . Years of education: Not on file  . Highest education level: Not on file  Occupational History  . Not on file  Tobacco Use  . Smoking status: Former Smoker    Packs/day: 1.00    Years: 15.00    Pack years: 15.00    Types: Cigarettes    Quit date: 03/29/1987    Years since quitting: 32.3  . Smokeless tobacco: Never Used  Substance and Sexual Activity  . Alcohol use: No  . Drug use: No  . Sexual activity: Not on file  Other Topics Concern  . Not on file  Social History Narrative  . Not on file   Social Determinants of Health   Financial Resource Strain:   . Difficulty of Paying Living Expenses:   Food Insecurity:   . Worried About Charity fundraiser in the Last Year:   . Arboriculturist in the Last Year:   Transportation Needs:   . Film/video editor (Medical):   Marland Kitchen Lack of Transportation (Non-Medical):   Physical Activity:   . Days of Exercise per Week:   . Minutes of Exercise per Session:   Stress:   . Feeling of Stress :   Social Connections:   . Frequency of Communication with Friends and Family:   . Frequency of Social Gatherings with Friends and Family:   . Attends Religious Services:   . Active Member of Clubs or Organizations:   . Attends Archivist Meetings:   Marland Kitchen Marital Status:      Family History: The patient's family history includes Diabetes in his brother, mother, and sister; Stroke in his father.  ROS:   Please see the history of present illness.    Overall, no complaints other than as noted.  He does mention that he is 86 years of age.  He has occasional dizziness.  No palpitations or syncope.  All  other systems reviewed and are negative.  EKGs/Labs/Other Studies Reviewed:    The following studies were reviewed today: Result from PCI July 2020: Intervention   2D Doppler echocardiogram February 2021: IMPRESSIONS    1. Left ventricular ejection fraction, by estimation, is 60 to 65%. The  left ventricle has normal function. The left ventricle has no regional  wall motion abnormalities. Left ventricular diastolic parameters are  consistent with Grade I diastolic  dysfunction (impaired relaxation). Elevated left ventricular end-diastolic  pressure.  2. Right ventricular systolic function is normal. The right ventricular  size is normal. There is normal pulmonary artery systolic pressure.  3. The mitral valve is normal in structure and function. No evidence of  mitral valve regurgitation.  No evidence of mitral stenosis.  4. The aortic valve is tricuspid. Aortic valve regurgitation is mild.  Mild aortic valve sclerosis is present, with no evidence of aortic valve  stenosis.  5. The inferior vena cava is normal in size with greater than 50%  respiratory variability, suggesting right atrial pressure of 3 mmHg.   EKG:  EKG not repeated.  Recent Labs: 08/06/2018: B Natriuretic Peptide 662.8 03/17/2019: ALT 21; Magnesium 2.2 03/19/2019: Platelets 206; TSH 4.548 03/20/2019: BUN 28; Creatinine, Ser 6.40; Hemoglobin 8.7; Potassium 4.9; Sodium 138  Recent Lipid Panel    Component Value Date/Time   CHOL 202 (H) 08/06/2018 1854   TRIG 200 (H) 08/06/2018 1854   HDL 68 08/06/2018 1854   CHOLHDL 3.0 08/06/2018 1854   VLDL 40 08/06/2018 1854   LDLCALC NOT CALCULATED 08/06/2018 1854    Physical Exam:    VS:  BP (!) 86/42   Pulse 94   Ht 5\' 7"  (1.702 m)   Wt 167 lb 9.6 oz (76 kg)   SpO2 95%   BMI 26.25 kg/m     Wt Readings from Last 3 Encounters:  07/19/19 167 lb 9.6 oz (76 kg)  05/06/19 168 lb (76.2 kg)  04/17/19 170 lb (77.1 kg)     GEN: Appears younger than stated age  despite being a dialysis patient and 86 years of age.. No acute distress HEENT: Normal NECK: No JVD. LYMPHATICS: No lymphadenopathy CARDIAC: 2/6 right upper sternal systolic murmur RRR without murmur, gallop, or edema. VASCULAR: Vascular access in left arm noted with a thrill.  Normal Pulses. No bruits. RESPIRATORY:  Clear to auscultation without rales, wheezing or rhonchi  ABDOMEN: Soft, non-tender, non-distended, No pulsatile mass, MUSCULOSKELETAL: No deformity  SKIN: Warm and dry NEUROLOGIC:  Alert and oriented x 3 PSYCHIATRIC:  Normal affect   ASSESSMENT:    1. Chronic systolic CHF (congestive heart failure) ([place])   2. Coronary artery disease involving native coronary artery of native heart with unstable angina pectoris ([place])   3. ESRD on dialysis ([place])   4. Diabetes mellitus due to underlying condition with hyperosmolarity without coma, with long-term current use of insulin ([place])   5. Hyperlipidemia LDL goal <70   6. Syncope and collapse    PLAN:    In order of problems listed above:  1. No clinical evidence of fluid overload.  Unable to use guideline directed heart failure therapy/systolic failure therapy due to dialysis and low blood pressure. 2. Stable angina.  Has chronically occluded right coronary.  When blood pressure gets low he has angina.  Nitroglycerin works.  Currently the best approach is to continue to use nitroglycerin as he has. 3. Continue dialysis.  Consider increasing dry weight to support blood pressure.  This may help decrease the burden of angina.  I did encourage him to drink fluid within requirements of nephrology.  This would help to support blood pressure. 4. Diabetes control as currently treated is excellent with an A1c of 7.1. 5. Continue high intensity Lipitor for LDL control. 6. No recurrent syncope.  Has loop recorder.  14-month follow-up.   Medication Adjustments/Labs and Tests Ordered: Current medicines are reviewed at length with the patient  today.  Concerns regarding medicines are outlined above.  No orders of the defined types were placed in this encounter.  No orders of the defined types were placed in this encounter.   Patient Instructions  Medication Instructions:  Your physician recommends that you continue on your current medications as directed. Please  refer to the Current Medication list given to you today.  *If you need a refill on your cardiac medications before your next appointment, please call your pharmacy*   Lab Work: None If you have labs (blood work) drawn today and your tests are completely normal, you will receive your results only by: Marland Kitchen MyChart Message (if you have MyChart) OR . A paper copy in the mail If you have any lab test that is abnormal or we need to change your treatment, we will call you to review the results.   Testing/Procedures: None   Follow-Up: At [facility], you and your health needs are our priority.  As part of our continuing mission to provide you with exceptional heart care, we have created designated Provider Care Teams.  These Care Teams include your primary Cardiologist (physician) and Advanced Practice Providers (APPs -  Physician Assistants and Nurse Practitioners) who all work together to provide you with the care you need, when you need it.  We recommend signing up for the patient portal called "MyChart".  Sign up information is provided on this After Visit Summary.  MyChart is used to connect with patients for Virtual Visits (Telemedicine).  Patients are able to view lab/test results, encounter notes, upcoming appointments, etc.  Non-urgent messages can be sent to your provider as well.   To learn more about what you can do with MyChart, go to [email or web address].    Your next appointment:   6 month(s)  The format for your next appointment:   In Person  Provider:   You may see Sinclair Grooms, MD or one of the following Advanced Practice Providers on your  designated Care Team:    Truitt Merle, NP  Cecilie Kicks, NP  Kathyrn Drown, NP    Other Instructions      Signed, Sinclair Grooms, MD  07/19/2019 10:13 AM    [place]

## 2019-07-15 ENCOUNTER — Ambulatory Visit (INDEPENDENT_AMBULATORY_CARE_PROVIDER_SITE_OTHER): Payer: Medicare Other | Admitting: *Deleted

## 2019-07-15 DIAGNOSIS — R55 Syncope and collapse: Secondary | ICD-10-CM | POA: Diagnosis not present

## 2019-07-15 LAB — CUP PACEART REMOTE DEVICE CHECK
Date Time Interrogation Session: 20210620230758
Implantable Pulse Generator Implant Date: 20210412

## 2019-07-16 NOTE — Progress Notes (Signed)
[facility] Summary Report / Loop Recorder 

## 2019-07-19 ENCOUNTER — Ambulatory Visit (INDEPENDENT_AMBULATORY_CARE_PROVIDER_SITE_OTHER): Payer: Medicare Other | Admitting: Interventional Cardiology

## 2019-07-19 ENCOUNTER — Other Ambulatory Visit: Payer: Self-pay

## 2019-07-19 ENCOUNTER — Encounter: Payer: Self-pay | Admitting: Interventional Cardiology

## 2019-07-19 VITALS — BP 86/42 | HR 94 | Ht 67.0 in | Wt 167.6 lb

## 2019-07-19 DIAGNOSIS — N186 End stage renal disease: Secondary | ICD-10-CM

## 2019-07-19 DIAGNOSIS — I25110 Atherosclerotic heart disease of native coronary artery with unstable angina pectoris: Secondary | ICD-10-CM | POA: Diagnosis not present

## 2019-07-19 DIAGNOSIS — I5022 Chronic systolic (congestive) heart failure: Secondary | ICD-10-CM | POA: Diagnosis not present

## 2019-07-19 DIAGNOSIS — E0800 Diabetes mellitus due to underlying condition with hyperosmolarity without nonketotic hyperglycemic-hyperosmolar coma (NKHHC): Secondary | ICD-10-CM

## 2019-07-19 DIAGNOSIS — Z992 Dependence on renal dialysis: Secondary | ICD-10-CM

## 2019-07-19 DIAGNOSIS — E785 Hyperlipidemia, unspecified: Secondary | ICD-10-CM

## 2019-07-19 DIAGNOSIS — R55 Syncope and collapse: Secondary | ICD-10-CM

## 2019-07-19 DIAGNOSIS — Z794 Long term (current) use of insulin: Secondary | ICD-10-CM

## 2019-07-19 NOTE — Patient Instructions (Signed)

## 2019-08-19 ENCOUNTER — Ambulatory Visit (INDEPENDENT_AMBULATORY_CARE_PROVIDER_SITE_OTHER): Payer: Medicare Other | Admitting: *Deleted

## 2019-08-19 DIAGNOSIS — R55 Syncope and collapse: Secondary | ICD-10-CM

## 2019-08-20 LAB — CUP PACEART REMOTE DEVICE CHECK
Date Time Interrogation Session: 20210725230714
Implantable Pulse Generator Implant Date: 20210412

## 2019-08-21 NOTE — Progress Notes (Signed)
[facility] Summary Report / Loop Recorder 

## 2019-09-18 DIAGNOSIS — Z7409 Other reduced mobility: Secondary | ICD-10-CM | POA: Diagnosis not present

## 2019-09-18 DIAGNOSIS — G8929 Other chronic pain: Secondary | ICD-10-CM | POA: Diagnosis not present

## 2019-09-18 DIAGNOSIS — Z789 Other specified health status: Secondary | ICD-10-CM | POA: Diagnosis not present

## 2019-09-18 DIAGNOSIS — M170 Bilateral primary osteoarthritis of knee: Secondary | ICD-10-CM | POA: Diagnosis not present

## 2019-09-18 DIAGNOSIS — M25562 Pain in left knee: Secondary | ICD-10-CM | POA: Diagnosis not present

## 2019-09-18 DIAGNOSIS — M25561 Pain in right knee: Secondary | ICD-10-CM | POA: Diagnosis not present

## 2019-09-23 ENCOUNTER — Ambulatory Visit (INDEPENDENT_AMBULATORY_CARE_PROVIDER_SITE_OTHER): Payer: Medicare Other | Admitting: *Deleted

## 2019-09-23 DIAGNOSIS — R55 Syncope and collapse: Secondary | ICD-10-CM | POA: Diagnosis not present

## 2019-09-23 LAB — CUP PACEART REMOTE DEVICE CHECK
Date Time Interrogation Session: 20210827230701
Implantable Pulse Generator Implant Date: 20210412

## 2019-09-25 DIAGNOSIS — M25562 Pain in left knee: Secondary | ICD-10-CM | POA: Diagnosis not present

## 2019-09-25 DIAGNOSIS — Z7409 Other reduced mobility: Secondary | ICD-10-CM | POA: Diagnosis not present

## 2019-09-25 DIAGNOSIS — G8929 Other chronic pain: Secondary | ICD-10-CM | POA: Diagnosis not present

## 2019-09-25 DIAGNOSIS — M170 Bilateral primary osteoarthritis of knee: Secondary | ICD-10-CM | POA: Diagnosis not present

## 2019-09-25 DIAGNOSIS — Z789 Other specified health status: Secondary | ICD-10-CM | POA: Diagnosis not present

## 2019-09-25 DIAGNOSIS — M25561 Pain in right knee: Secondary | ICD-10-CM | POA: Diagnosis not present

## 2019-09-25 NOTE — Progress Notes (Signed)
[facility] Summary Report / Loop Recorder 

## 2019-10-23 LAB — CUP PACEART REMOTE DEVICE CHECK
Date Time Interrogation Session: 20210929230635
Implantable Pulse Generator Implant Date: 20210412

## 2019-10-28 ENCOUNTER — Ambulatory Visit (INDEPENDENT_AMBULATORY_CARE_PROVIDER_SITE_OTHER): Payer: Medicare Other

## 2019-10-28 DIAGNOSIS — R55 Syncope and collapse: Secondary | ICD-10-CM | POA: Diagnosis not present

## 2019-10-29 NOTE — Progress Notes (Signed)
[facility] Summary Report / Loop Recorder 

## 2019-11-28 LAB — CUP PACEART REMOTE DEVICE CHECK
Date Time Interrogation Session: 20211101230417
Implantable Pulse Generator Implant Date: 20210412

## 2019-12-02 ENCOUNTER — Ambulatory Visit (INDEPENDENT_AMBULATORY_CARE_PROVIDER_SITE_OTHER): Payer: Medicare Other

## 2019-12-02 DIAGNOSIS — R55 Syncope and collapse: Secondary | ICD-10-CM

## 2019-12-03 NOTE — Progress Notes (Signed)
[facility] Summary Report / Loop Recorder 

## 2019-12-23 DIAGNOSIS — Z789 Other specified health status: Secondary | ICD-10-CM | POA: Diagnosis not present

## 2019-12-23 DIAGNOSIS — G8929 Other chronic pain: Secondary | ICD-10-CM | POA: Diagnosis not present

## 2019-12-23 DIAGNOSIS — Z7409 Other reduced mobility: Secondary | ICD-10-CM | POA: Diagnosis not present

## 2019-12-23 DIAGNOSIS — M170 Bilateral primary osteoarthritis of knee: Secondary | ICD-10-CM | POA: Diagnosis not present

## 2019-12-23 DIAGNOSIS — M25562 Pain in left knee: Secondary | ICD-10-CM | POA: Diagnosis not present

## 2019-12-23 DIAGNOSIS — M25561 Pain in right knee: Secondary | ICD-10-CM | POA: Diagnosis not present

## 2019-12-25 DIAGNOSIS — M25561 Pain in right knee: Secondary | ICD-10-CM | POA: Diagnosis not present

## 2019-12-25 DIAGNOSIS — M170 Bilateral primary osteoarthritis of knee: Secondary | ICD-10-CM | POA: Diagnosis not present

## 2019-12-25 DIAGNOSIS — Z7409 Other reduced mobility: Secondary | ICD-10-CM | POA: Diagnosis not present

## 2019-12-25 DIAGNOSIS — Z789 Other specified health status: Secondary | ICD-10-CM | POA: Diagnosis not present

## 2019-12-25 DIAGNOSIS — G8929 Other chronic pain: Secondary | ICD-10-CM | POA: Diagnosis not present

## 2019-12-25 DIAGNOSIS — M25562 Pain in left knee: Secondary | ICD-10-CM | POA: Diagnosis not present

## 2019-12-30 DIAGNOSIS — M170 Bilateral primary osteoarthritis of knee: Secondary | ICD-10-CM | POA: Diagnosis not present

## 2019-12-30 DIAGNOSIS — M25561 Pain in right knee: Secondary | ICD-10-CM | POA: Diagnosis not present

## 2019-12-30 DIAGNOSIS — Z789 Other specified health status: Secondary | ICD-10-CM | POA: Diagnosis not present

## 2019-12-30 DIAGNOSIS — Z7409 Other reduced mobility: Secondary | ICD-10-CM | POA: Diagnosis not present

## 2019-12-30 DIAGNOSIS — G8929 Other chronic pain: Secondary | ICD-10-CM | POA: Diagnosis not present

## 2019-12-30 DIAGNOSIS — M25562 Pain in left knee: Secondary | ICD-10-CM | POA: Diagnosis not present

## 2020-01-01 DIAGNOSIS — Z789 Other specified health status: Secondary | ICD-10-CM | POA: Diagnosis not present

## 2020-01-01 DIAGNOSIS — M170 Bilateral primary osteoarthritis of knee: Secondary | ICD-10-CM | POA: Diagnosis not present

## 2020-01-01 DIAGNOSIS — M25561 Pain in right knee: Secondary | ICD-10-CM | POA: Diagnosis not present

## 2020-01-01 DIAGNOSIS — M25562 Pain in left knee: Secondary | ICD-10-CM | POA: Diagnosis not present

## 2020-01-01 DIAGNOSIS — G8929 Other chronic pain: Secondary | ICD-10-CM | POA: Diagnosis not present

## 2020-01-01 DIAGNOSIS — Z7409 Other reduced mobility: Secondary | ICD-10-CM | POA: Diagnosis not present

## 2020-01-05 LAB — CUP PACEART REMOTE DEVICE CHECK
Date Time Interrogation Session: 20211204230634
Implantable Pulse Generator Implant Date: 20210412

## 2020-01-05 NOTE — Progress Notes (Signed)
Cardiology Office Note:    Date:  01/06/2020   ID:  Nicholas [name], DOB 10-26-1930, MRN [id number]  PCP[facility]  [facility]  Cardiologist:  Sinclair Grooms, MD   Referring MD: [facility]   Chief Complaint  Patient presents with  . Coronary Artery Disease  . Congestive Heart Failure    History of Present Illness:    Nicholas [name] is a 86 y.o. male with a hx of CAD, chronic systolic CHF,ESRD on HD TTS, IDT2DM, HTN, HLD, anemia of chronic disease requiring transfusion.Non-ST elevation MI June 2020 ultimately treated LAD with orbital atherectomy and stenting (Dr. Saundra Shelling). Since event has has recurrent syncope and now has loop recorder.  He uses nitroglycerin when he has tightness in his chest.  Nitroglycerin relieves it briskly.  He comes in today with a wheelchair.  He does ambulate at home.  He has been having cough.  CT revealed pleural effusion on the right.  They have decreased his dry weight.  Has syncope.  He is never had to take more than 1 nitroglycerin to relieve symptoms.  Past Medical History:  Diagnosis Date  . Arthritis   . Chronic kidney disease   . Diabetes mellitus ([place])   . Dialysis patient ([place])   . GERD (gastroesophageal reflux disease)    occ  . Heart murmur   . Hypercholesteremia   . Hypertension   . Psoriasis     Past Surgical History:  Procedure Laterality Date  . CARDIAC CATHETERIZATION  09   "some blockage" with collaterals by cath at [facility] ~ 2009; no intervention required; reportedly, no routine cardiology f/u recommended   . CORONARY ATHERECTOMY N/A 08/09/2018   Procedure: CORONARY ATHERECTOMY;  Surgeon: Jettie Booze, MD;  Location: [place] [facility];  Service: Cardiovascular;  Laterality: N/A;  . CORONARY STENT INTERVENTION N/A 08/09/2018   Procedure: CORONARY STENT INTERVENTION;  Surgeon: Jettie Booze, MD;  Location: [place] [facility];  Service: Cardiovascular;  Laterality: N/A;  . INGUINAL HERNIA REPAIR Right  04/13/2012   Procedure: HERNIA REPAIR INGUINAL INCARCERATED;  Surgeon: Gayland Curry, MD;  Location: [place];  Service: General;  Laterality: Right;  . INSERTION OF MESH Right 04/13/2012   Procedure: INSERTION OF MESH;  Surgeon: Gayland Curry, MD;  Location: [place];  Service: General;  Laterality: Right;  . LEFT HEART CATH N/A 08/09/2018   Procedure: Left Heart Cath;  Surgeon: Jettie Booze, MD;  Location: [place] [facility];  Service: Cardiovascular;  Laterality: N/A;  . LEFT HEART CATH AND CORONARY ANGIOGRAPHY N/A 08/08/2018   Procedure: LEFT HEART CATH AND CORONARY ANGIOGRAPHY;  Surgeon: Troy Sine, MD;  Location: [place] [facility];  Service: Cardiovascular;  Laterality: N/A;  . VASECTOMY      Current Medications: Current Meds  Medication Sig  . atorvastatin (LIPITOR) 80 MG tablet Take 80 mg by mouth daily.  . cinacalcet (SENSIPAR) 30 MG tablet Take 30-60 mg by mouth See admin instructions. Take 2 tablets (60 mg) by mouth on Tuesday, Thursday, Saturday (dialysis days), take 1 tablet (30 mg) on Sunday, Monday, Wednesday, Friday (non-dialysis days)  . clopidogrel (PLAVIX) 75 MG tablet Take 1 tablet (75 mg total) by mouth daily with breakfast.  . insulin NPH-regular Human (70-30) 100 UNIT/ML injection Inject 20 Units into the skin See admin instructions. Inject 20 units subcutaneously in the morning as needed for CBG >140, inject 20 units every evening  . isosorbide dinitrate (ISORDIL) 20 MG tablet Take  1 tablet (20 mg total) by mouth at bedtime.  . lidocaine-prilocaine (EMLA) cream Apply 1 application topically See admin instructions. Apply topically 30 minutes prior to dialysis on Tuesday, Thursday, Saturday  . nitroGLYCERIN (NITROSTAT) 0.4 MG SL tablet Place 1 tablet (0.4 mg total) under the tongue every 5 (five) minutes x 3 doses as needed for chest pain.  . Nutritional Supplements (NEPRO/CARBSTEADY PO) Take 237 mLs by mouth daily. vanilla  . sevelamer carbonate (RENVELA) 800 MG  tablet Take 1,600 mg by mouth 3 (three) times daily.     Allergies:   Hydrochlorothiazide   Social History   Socioeconomic History  . Marital status: Married    Spouse name: Not on file  . Number of children: Not on file  . Years of education: Not on file  . Highest education level: Not on file  Occupational History  . Not on file  Tobacco Use  . Smoking status: Former Smoker    Packs/day: 1.00    Years: 15.00    Pack years: 15.00    Types: Cigarettes    Quit date: 03/29/1987    Years since quitting: 32.7  . Smokeless tobacco: Never Used  Substance and Sexual Activity  . Alcohol use: No  . Drug use: No  . Sexual activity: Not on file  Other Topics Concern  . Not on file  Social History Narrative  . Not on file   Social Determinants of Health   Financial Resource Strain: Not on file  Food Insecurity: Not on file  Transportation Needs: Not on file  Physical Activity: Not on file  Stress: Not on file  Social Connections: Not on file     Family History: The patient's family history includes Diabetes in his brother, mother, and sister; Stroke in his father.  ROS:   Please see the history of present illness.    Knees and back prevent ambulation.  Tolerating dialysis well.  Has not had syncope.  All other systems reviewed and are negative.  EKGs/Labs/Other Studies Reviewed:    The following studies were reviewed today: No new data Long-term monitor 03/20/2019 Study Highlights    Normal sinus rhythm  PACs and PVCs are noted.  Nonsustained VT at rates less than 140 and duration 5 beats was noted.  No atrial fibrillation.  Brief ectopic atrial tachycardia.   Overall, PACs, PVCs, nonsustained SVT, but no malignant arrhythmias noted.  No pauses are noted.  2D Doppler echocardiogram February 2021 IMPRESSIONS    1. Left ventricular ejection fraction, by estimation, is 60 to 65%. The  left ventricle has normal function. The left ventricle has no regional   wall motion abnormalities. Left ventricular diastolic parameters are  consistent with Grade I diastolic  dysfunction (impaired relaxation). Elevated left ventricular end-diastolic  pressure.  2. Right ventricular systolic function is normal. The right ventricular  size is normal. There is normal pulmonary artery systolic pressure.  3. The mitral valve is normal in structure and function. No evidence of  mitral valve regurgitation. No evidence of mitral stenosis.  4. The aortic valve is tricuspid. Aortic valve regurgitation is mild.  Mild aortic valve sclerosis is present, with no evidence of aortic valve  stenosis.  5. The inferior vena cava is normal in size with greater than 50%  respiratory variability, suggesting right atrial pressure of 3 mmHg.   EKG:  EKG not repeated  Recent Labs: 03/17/2019: ALT 21; Magnesium 2.2 03/19/2019: Platelets 206; TSH 4.548 03/20/2019: BUN 28; Creatinine, Ser 6.40; Hemoglobin 8.7;  Potassium 4.9; Sodium 138  Recent Lipid Panel    Component Value Date/Time   CHOL 202 (H) 08/06/2018 1854   TRIG 200 (H) 08/06/2018 1854   HDL 68 08/06/2018 1854   CHOLHDL 3.0 08/06/2018 1854   VLDL 40 08/06/2018 1854   LDLCALC NOT CALCULATED 08/06/2018 1854    Physical Exam:    VS:  BP (!) 116/52   Pulse 83   Ht 5\' 8"  (1.727 m)   Wt 165 lb (74.8 kg)   SpO2 99%   BMI 25.09 kg/m     Wt Readings from Last 3 Encounters:  01/06/20 165 lb (74.8 kg)  07/19/19 167 lb 9.6 oz (76 kg)  05/06/19 168 lb (76.2 kg)     GEN: In wheelchair.  Appears younger than stated age.. No acute distress HEENT: Normal NECK: No JVD. LYMPHATICS: No lymphadenopathy CARDIAC: 2-3 over 6 right upper sternal systolic murmur.  RRR without diastolic murmur, gallop, but with trace bilateral ankle edema. VASCULAR:  Normal Pulses. No bruits. RESPIRATORY:  Clear to auscultation without rales, wheezing or rhonchi  ABDOMEN: Soft, non-tender, non-distended, No pulsatile mass, MUSCULOSKELETAL:  No deformity  SKIN: Warm and dry NEUROLOGIC:  Alert and oriented x 3 PSYCHIATRIC:  Normal affect   ASSESSMENT:    1. Chronic systolic CHF (congestive heart failure) ([place])   2. Coronary artery disease involving native coronary artery of native heart with unstable angina pectoris ([place])   3. ESRD on dialysis ([place])   4. Diabetes mellitus due to underlying condition with hyperosmolarity without coma, with long-term current use of insulin ([place])   5. Hyperlipidemia LDL goal <70   6. Educated about COVID-19 virus infection    PLAN:    In order of problems listed above:  1. Being controlled by dialysis.  Recent reduction in dry weight because of right pleural effusion has led to symptomatic improvement. 2. Sublingual nitroglycerin as needed for episodes of chest tightness. 3. Continue current dialysis protocol. 4. Target hemoglobin A1c less than 7 5. Target LDL less than 70.  Currently on high intensity statin therapy at 80 mg/day. 6. Vaccinated and practicing mitigation.  Overall education and awareness concerning primary/secondary risk prevention was discussed in detail: LDL less than 70, hemoglobin A1c less than 7, blood pressure target less than 130/80 mmHg, >150 minutes of moderate aerobic activity per week, avoidance of smoking, weight control (via diet and exercise), and continued surveillance/management of/for obstructive sleep apnea.    Medication Adjustments/Labs and Tests Ordered: Current medicines are reviewed at length with the patient today.  Concerns regarding medicines are outlined above.  No orders of the defined types were placed in this encounter.  No orders of the defined types were placed in this encounter.   There are no Patient Instructions on file for this visit.   Signed, Sinclair Grooms, MD  01/06/2020 10:08 AM    [place]

## 2020-01-06 ENCOUNTER — Ambulatory Visit (INDEPENDENT_AMBULATORY_CARE_PROVIDER_SITE_OTHER): Payer: Medicare Other | Admitting: Interventional Cardiology

## 2020-01-06 ENCOUNTER — Ambulatory Visit (INDEPENDENT_AMBULATORY_CARE_PROVIDER_SITE_OTHER): Payer: Medicare Other

## 2020-01-06 ENCOUNTER — Other Ambulatory Visit: Payer: Self-pay

## 2020-01-06 ENCOUNTER — Encounter: Payer: Self-pay | Admitting: Interventional Cardiology

## 2020-01-06 VITALS — BP 116/52 | HR 83 | Ht 68.0 in | Wt 165.0 lb

## 2020-01-06 DIAGNOSIS — I5022 Chronic systolic (congestive) heart failure: Secondary | ICD-10-CM | POA: Diagnosis not present

## 2020-01-06 DIAGNOSIS — I25110 Atherosclerotic heart disease of native coronary artery with unstable angina pectoris: Secondary | ICD-10-CM

## 2020-01-06 DIAGNOSIS — M25561 Pain in right knee: Secondary | ICD-10-CM | POA: Diagnosis not present

## 2020-01-06 DIAGNOSIS — E0800 Diabetes mellitus due to underlying condition with hyperosmolarity without nonketotic hyperglycemic-hyperosmolar coma (NKHHC): Secondary | ICD-10-CM

## 2020-01-06 DIAGNOSIS — Z794 Long term (current) use of insulin: Secondary | ICD-10-CM

## 2020-01-06 DIAGNOSIS — R55 Syncope and collapse: Secondary | ICD-10-CM | POA: Diagnosis not present

## 2020-01-06 DIAGNOSIS — Z992 Dependence on renal dialysis: Secondary | ICD-10-CM

## 2020-01-06 DIAGNOSIS — M25562 Pain in left knee: Secondary | ICD-10-CM | POA: Diagnosis not present

## 2020-01-06 DIAGNOSIS — Z789 Other specified health status: Secondary | ICD-10-CM | POA: Diagnosis not present

## 2020-01-06 DIAGNOSIS — M170 Bilateral primary osteoarthritis of knee: Secondary | ICD-10-CM | POA: Diagnosis not present

## 2020-01-06 DIAGNOSIS — N186 End stage renal disease: Secondary | ICD-10-CM | POA: Diagnosis not present

## 2020-01-06 DIAGNOSIS — G8929 Other chronic pain: Secondary | ICD-10-CM | POA: Diagnosis not present

## 2020-01-06 DIAGNOSIS — E785 Hyperlipidemia, unspecified: Secondary | ICD-10-CM

## 2020-01-06 DIAGNOSIS — Z7189 Other specified counseling: Secondary | ICD-10-CM

## 2020-01-06 DIAGNOSIS — Z7409 Other reduced mobility: Secondary | ICD-10-CM | POA: Diagnosis not present

## 2020-01-06 NOTE — Patient Instructions (Signed)
Medication Instructions:  Your physician recommends that you continue on your current medications as directed. Please refer to the Current Medication list given to you today.  *If you need a refill on your cardiac medications before your next appointment, please call your pharmacy*   Lab Work: None If you have labs (blood work) drawn today and your tests are completely normal, you will receive your results only by: Marland Kitchen MyChart Message (if you have MyChart) OR . A paper copy in the mail If you have any lab test that is abnormal or we need to change your treatment, we will call you to review the results.   Testing/Procedures: None   Follow-Up: At [facility], you and your health needs are our priority.  As part of our continuing mission to provide you with exceptional heart care, we have created designated Provider Care Teams.  These Care Teams include your primary Cardiologist (physician) and Advanced Practice Providers (APPs -  Physician Assistants and Nurse Practitioners) who all work together to provide you with the care you need, when you need it.  We recommend signing up for the patient portal called "MyChart".  Sign up information is provided on this After Visit Summary.  MyChart is used to connect with patients for Virtual Visits (Telemedicine).  Patients are able to view lab/test results, encounter notes, upcoming appointments, etc.  Non-urgent messages can be sent to your provider as well.   To learn more about what you can do with MyChart, go to [email or web address].    Your next appointment:   8 month(s)  The format for your next appointment:   In Person  Provider:   You may see Sinclair Grooms, MD or one of the following Advanced Practice Providers on your designated Care Team:    Truitt Merle, NP  Cecilie Kicks, NP  Kathyrn Drown, NP    Other Instructions

## 2020-01-08 DIAGNOSIS — Z789 Other specified health status: Secondary | ICD-10-CM | POA: Diagnosis not present

## 2020-01-08 DIAGNOSIS — G8929 Other chronic pain: Secondary | ICD-10-CM | POA: Diagnosis not present

## 2020-01-08 DIAGNOSIS — M25561 Pain in right knee: Secondary | ICD-10-CM | POA: Diagnosis not present

## 2020-01-08 DIAGNOSIS — M170 Bilateral primary osteoarthritis of knee: Secondary | ICD-10-CM | POA: Diagnosis not present

## 2020-01-08 DIAGNOSIS — Z7409 Other reduced mobility: Secondary | ICD-10-CM | POA: Diagnosis not present

## 2020-01-08 DIAGNOSIS — M25562 Pain in left knee: Secondary | ICD-10-CM | POA: Diagnosis not present

## 2020-01-13 DIAGNOSIS — M25561 Pain in right knee: Secondary | ICD-10-CM | POA: Diagnosis not present

## 2020-01-13 DIAGNOSIS — Z789 Other specified health status: Secondary | ICD-10-CM | POA: Diagnosis not present

## 2020-01-13 DIAGNOSIS — M170 Bilateral primary osteoarthritis of knee: Secondary | ICD-10-CM | POA: Diagnosis not present

## 2020-01-13 DIAGNOSIS — G8929 Other chronic pain: Secondary | ICD-10-CM | POA: Diagnosis not present

## 2020-01-13 DIAGNOSIS — M25562 Pain in left knee: Secondary | ICD-10-CM | POA: Diagnosis not present

## 2020-01-13 DIAGNOSIS — Z7409 Other reduced mobility: Secondary | ICD-10-CM | POA: Diagnosis not present

## 2020-01-20 DIAGNOSIS — Z7409 Other reduced mobility: Secondary | ICD-10-CM | POA: Diagnosis not present

## 2020-01-20 DIAGNOSIS — G8929 Other chronic pain: Secondary | ICD-10-CM | POA: Diagnosis not present

## 2020-01-20 DIAGNOSIS — M170 Bilateral primary osteoarthritis of knee: Secondary | ICD-10-CM | POA: Diagnosis not present

## 2020-01-20 DIAGNOSIS — M25561 Pain in right knee: Secondary | ICD-10-CM | POA: Diagnosis not present

## 2020-01-20 DIAGNOSIS — Z789 Other specified health status: Secondary | ICD-10-CM | POA: Diagnosis not present

## 2020-01-20 DIAGNOSIS — M25562 Pain in left knee: Secondary | ICD-10-CM | POA: Diagnosis not present

## 2020-01-21 NOTE — Progress Notes (Signed)
[facility] Summary Report / Loop Recorder 

## 2020-01-29 DIAGNOSIS — Z7409 Other reduced mobility: Secondary | ICD-10-CM | POA: Diagnosis not present

## 2020-01-29 DIAGNOSIS — G8929 Other chronic pain: Secondary | ICD-10-CM | POA: Diagnosis not present

## 2020-01-29 DIAGNOSIS — M25561 Pain in right knee: Secondary | ICD-10-CM | POA: Diagnosis not present

## 2020-01-29 DIAGNOSIS — M25562 Pain in left knee: Secondary | ICD-10-CM | POA: Diagnosis not present

## 2020-01-29 DIAGNOSIS — M170 Bilateral primary osteoarthritis of knee: Secondary | ICD-10-CM | POA: Diagnosis not present

## 2020-01-29 DIAGNOSIS — Z789 Other specified health status: Secondary | ICD-10-CM | POA: Diagnosis not present

## 2020-02-03 DIAGNOSIS — E1159 Type 2 diabetes mellitus with other circulatory complications: Secondary | ICD-10-CM | POA: Diagnosis not present

## 2020-02-03 DIAGNOSIS — B351 Tinea unguium: Secondary | ICD-10-CM | POA: Diagnosis not present

## 2020-02-03 DIAGNOSIS — L84 Corns and callosities: Secondary | ICD-10-CM | POA: Diagnosis not present

## 2020-02-05 DIAGNOSIS — Z7409 Other reduced mobility: Secondary | ICD-10-CM | POA: Diagnosis not present

## 2020-02-05 DIAGNOSIS — M25562 Pain in left knee: Secondary | ICD-10-CM | POA: Diagnosis not present

## 2020-02-05 DIAGNOSIS — M170 Bilateral primary osteoarthritis of knee: Secondary | ICD-10-CM | POA: Diagnosis not present

## 2020-02-05 DIAGNOSIS — M25561 Pain in right knee: Secondary | ICD-10-CM | POA: Diagnosis not present

## 2020-02-05 DIAGNOSIS — Z789 Other specified health status: Secondary | ICD-10-CM | POA: Diagnosis not present

## 2020-02-05 DIAGNOSIS — G8929 Other chronic pain: Secondary | ICD-10-CM | POA: Diagnosis not present

## 2020-02-10 ENCOUNTER — Ambulatory Visit (INDEPENDENT_AMBULATORY_CARE_PROVIDER_SITE_OTHER): Payer: Medicare Other

## 2020-02-10 DIAGNOSIS — R55 Syncope and collapse: Secondary | ICD-10-CM

## 2020-02-12 DIAGNOSIS — M25562 Pain in left knee: Secondary | ICD-10-CM | POA: Diagnosis not present

## 2020-02-12 DIAGNOSIS — Z7409 Other reduced mobility: Secondary | ICD-10-CM | POA: Diagnosis not present

## 2020-02-12 DIAGNOSIS — G8929 Other chronic pain: Secondary | ICD-10-CM | POA: Diagnosis not present

## 2020-02-12 DIAGNOSIS — Z789 Other specified health status: Secondary | ICD-10-CM | POA: Diagnosis not present

## 2020-02-12 DIAGNOSIS — M170 Bilateral primary osteoarthritis of knee: Secondary | ICD-10-CM | POA: Diagnosis not present

## 2020-02-12 DIAGNOSIS — M25561 Pain in right knee: Secondary | ICD-10-CM | POA: Diagnosis not present

## 2020-02-13 LAB — CUP PACEART REMOTE DEVICE CHECK
Date Time Interrogation Session: 20220115230624
Implantable Pulse Generator Implant Date: 20210412

## 2020-02-17 DIAGNOSIS — Z7409 Other reduced mobility: Secondary | ICD-10-CM | POA: Diagnosis not present

## 2020-02-17 DIAGNOSIS — G8929 Other chronic pain: Secondary | ICD-10-CM | POA: Diagnosis not present

## 2020-02-17 DIAGNOSIS — M25562 Pain in left knee: Secondary | ICD-10-CM | POA: Diagnosis not present

## 2020-02-17 DIAGNOSIS — Z789 Other specified health status: Secondary | ICD-10-CM | POA: Diagnosis not present

## 2020-02-17 DIAGNOSIS — M170 Bilateral primary osteoarthritis of knee: Secondary | ICD-10-CM | POA: Diagnosis not present

## 2020-02-17 DIAGNOSIS — M25561 Pain in right knee: Secondary | ICD-10-CM | POA: Diagnosis not present

## 2020-02-24 DIAGNOSIS — Z7409 Other reduced mobility: Secondary | ICD-10-CM | POA: Diagnosis not present

## 2020-02-24 DIAGNOSIS — G8929 Other chronic pain: Secondary | ICD-10-CM | POA: Diagnosis not present

## 2020-02-24 DIAGNOSIS — M25562 Pain in left knee: Secondary | ICD-10-CM | POA: Diagnosis not present

## 2020-02-24 DIAGNOSIS — Z789 Other specified health status: Secondary | ICD-10-CM | POA: Diagnosis not present

## 2020-02-24 DIAGNOSIS — M25561 Pain in right knee: Secondary | ICD-10-CM | POA: Diagnosis not present

## 2020-02-24 DIAGNOSIS — M170 Bilateral primary osteoarthritis of knee: Secondary | ICD-10-CM | POA: Diagnosis not present

## 2020-02-24 NOTE — Progress Notes (Signed)
[facility] Summary Report / Loop Recorder 

## 2020-03-16 ENCOUNTER — Ambulatory Visit (INDEPENDENT_AMBULATORY_CARE_PROVIDER_SITE_OTHER): Payer: Medicare Other

## 2020-03-16 DIAGNOSIS — R55 Syncope and collapse: Secondary | ICD-10-CM

## 2020-03-16 LAB — CUP PACEART REMOTE DEVICE CHECK
Date Time Interrogation Session: 20220217230618
Implantable Pulse Generator Implant Date: 20210412

## 2020-03-23 NOTE — Progress Notes (Signed)
[facility] Summary Report / Loop Recorder 

## 2020-04-18 LAB — CUP PACEART REMOTE DEVICE CHECK
Date Time Interrogation Session: 20220322230316
Implantable Pulse Generator Implant Date: 20210412

## 2020-04-20 ENCOUNTER — Ambulatory Visit (INDEPENDENT_AMBULATORY_CARE_PROVIDER_SITE_OTHER): Payer: Medicare Other

## 2020-04-20 DIAGNOSIS — R55 Syncope and collapse: Secondary | ICD-10-CM

## 2020-04-28 ENCOUNTER — Emergency Department (HOSPITAL_BASED_OUTPATIENT_CLINIC_OR_DEPARTMENT_OTHER)
Admission: EM | Admit: 2020-04-28 | Discharge: 2020-04-28 | Disposition: A | Discharge status: Home / Self Care | Payer: Medicare Other | Attending: Emergency Medicine | Admitting: Emergency Medicine

## 2020-04-28 ENCOUNTER — Encounter (HOSPITAL_BASED_OUTPATIENT_CLINIC_OR_DEPARTMENT_OTHER): Payer: Self-pay | Admitting: *Deleted

## 2020-04-28 ENCOUNTER — Other Ambulatory Visit: Payer: Self-pay

## 2020-04-28 DIAGNOSIS — Z992 Dependence on renal dialysis: Secondary | ICD-10-CM | POA: Diagnosis not present

## 2020-04-28 DIAGNOSIS — Z955 Presence of coronary angioplasty implant and graft: Secondary | ICD-10-CM | POA: Insufficient documentation

## 2020-04-28 DIAGNOSIS — N186 End stage renal disease: Secondary | ICD-10-CM | POA: Insufficient documentation

## 2020-04-28 DIAGNOSIS — I5022 Chronic systolic (congestive) heart failure: Secondary | ICD-10-CM | POA: Insufficient documentation

## 2020-04-28 DIAGNOSIS — Z87891 Personal history of nicotine dependence: Secondary | ICD-10-CM | POA: Insufficient documentation

## 2020-04-28 DIAGNOSIS — M79621 Pain in right upper arm: Secondary | ICD-10-CM | POA: Insufficient documentation

## 2020-04-28 DIAGNOSIS — I2510 Atherosclerotic heart disease of native coronary artery without angina pectoris: Secondary | ICD-10-CM | POA: Insufficient documentation

## 2020-04-28 DIAGNOSIS — M79622 Pain in left upper arm: Secondary | ICD-10-CM | POA: Insufficient documentation

## 2020-04-28 DIAGNOSIS — E1122 Type 2 diabetes mellitus with diabetic chronic kidney disease: Secondary | ICD-10-CM | POA: Insufficient documentation

## 2020-04-28 DIAGNOSIS — Z794 Long term (current) use of insulin: Secondary | ICD-10-CM | POA: Diagnosis not present

## 2020-04-28 DIAGNOSIS — M5412 Radiculopathy, cervical region: Secondary | ICD-10-CM | POA: Insufficient documentation

## 2020-04-28 DIAGNOSIS — M79602 Pain in left arm: Secondary | ICD-10-CM | POA: Diagnosis not present

## 2020-04-28 DIAGNOSIS — M79601 Pain in right arm: Secondary | ICD-10-CM

## 2020-04-28 DIAGNOSIS — I132 Hypertensive heart and chronic kidney disease with heart failure and with stage 5 chronic kidney disease, or end stage renal disease: Secondary | ICD-10-CM | POA: Diagnosis not present

## 2020-04-28 MED ORDER — HYDROCODONE-ACETAMINOPHEN 5-325 MG PO TABS: 1.0000 | ORAL_TABLET | Freq: Four times a day (QID) | ORAL | 0 refills | Status: DC | PRN

## 2020-04-28 MED ORDER — HYDROCODONE-ACETAMINOPHEN 5-325 MG PO TABS
1.0000 | ORAL_TABLET | Freq: Once | ORAL | Status: AC
Start: 2020-04-28 — End: 2020-04-28
  Administered 2020-04-28: 1 via ORAL
  Filled 2020-04-28: qty 1

## 2020-04-28 MED ORDER — ACETAMINOPHEN 325 MG PO TABS
325.0000 mg | ORAL_TABLET | Freq: Once | ORAL | Status: AC
  Administered 2020-04-28: 325 mg via ORAL
  Filled 2020-04-28: qty 1

## 2020-04-28 MED ORDER — HYDROCODONE-ACETAMINOPHEN 5-325 MG PO TABS: 1.0000 | ORAL_TABLET | Freq: Three times a day (TID) | ORAL | 0 refills | Status: DC | PRN

## 2020-04-28 NOTE — ED Triage Notes (Signed)
Arms spasms in his right arm for a while. Today he started having the same spasms in his left arm. He was started on Gabapentin for arm spasms. He had negative xrays of both shoulders last week.

## 2020-04-28 NOTE — Discharge Instructions (Signed)
I suspect your arm pain is coming from a pinched nerve likely from your neck or shoulder.  You need to see your primary provider and discuss getting an MRI of your neck.  Another test that might help identify a pinched nerve is called an EMG.  Your provider may consider doing these tests after you follow-up and discuss her symptoms. Take a Vicodin tablet in the morning and 1 in the evening for pain.  If you take 1 Vicodin tablet, you may take 1 Tylenol tablet with it.  See if this is sufficient for pain control.  Because you are on dialysis, care must be taken in adding medications.  Please discuss increasing the dose of Vicodin with your primary care provider if you need more pain control. Return to the emergency department immediately if you get weakness or numbness in your arms or your legs.  If you get pain in your chest, shortness of breath or other concerning symptoms.

## 2020-04-28 NOTE — ED Provider Notes (Signed)
[facility] Provider Note   CSN: [id number] Arrival date & time: 04/28/20  1844     History No chief complaint on file.   Nicholas [name] is a 87 y.o. male.  HPI Patient reports that he has been getting pains into his right arm for at least several months.  He reports he gets a quick, lancinating sort of "grabbing" pain.  Frequently gets in his forearm but sometimes in the upper arm.  He reports it will seem to act up for a week or so and then go away again for a few months.  He has seen his doctor for it and has some x-rays on the shoulder but no specific diagnosis is made.  He reports that he was prescribed some Neurontin but that does not seem to be helping.  He reports now the symptom is started in the left arm occasionally as well.  Is the same quality and pattern of pain.  He reports occasionally they will happen at the same time but might happen independently.  He reports it does not cause him to be weak or numb in the arms.  He reports it passes really quickly but is quite painful.  He denies any neck pain.  He denies any chest pain or shortness of breath.  He has been doing usual activities.  He has not been dropping things or weak.  No weakness or numbness into the legs.  Patient goes to dialysis.  He reports he is getting dialyzed on schedule.  He reports there is no association with this pain.  It does not happen more or less before after dialysis.  Is not triggered by any activities    Past Medical History:  Diagnosis Date  . Arthritis   . Chronic kidney disease   . Diabetes mellitus ([place])   . Dialysis patient ([place])   . GERD (gastroesophageal reflux disease)    occ  . Heart murmur   . Hypercholesteremia   . Hypertension   . Psoriasis     Patient Active Problem List   Diagnosis Date Noted  . Hypotension   . Syncope and collapse 03/17/2019  . Symptomatic anemia 09/28/2018  . GIB (gastrointestinal bleeding) 09/27/2018  . Anemia due to blood  loss 09/27/2018  . Type II diabetes mellitus with renal manifestations ([place]) 09/27/2018  . GERD (gastroesophageal reflux disease) 09/27/2018  . CAD (coronary artery disease) 09/27/2018  . Chronic systolic CHF (congestive heart failure) ([place]) 09/27/2018  . NSTEMI (non-ST elevated myocardial infarction) ([place]) 08/06/2018  . Diabetes mellitus ([place])   . Hypertension   . Hypercholesteremia   . ESRD (end stage renal disease) on dialysis ([place])   . Anemia of chronic disease   . Lung nodule seen on imaging study 04/30/2012    Past Surgical History:  Procedure Laterality Date  . CARDIAC CATHETERIZATION  09   "some blockage" with collaterals by cath at [facility] ~ 2009; no intervention required; reportedly, no routine cardiology f/u recommended   . CORONARY ATHERECTOMY N/A 08/09/2018   Procedure: CORONARY ATHERECTOMY;  Surgeon: Jettie Booze, MD;  Location: [facility];  Service: Cardiovascular;  Laterality: N/A;  . CORONARY STENT INTERVENTION N/A 08/09/2018   Procedure: CORONARY STENT INTERVENTION;  Surgeon: Jettie Booze, MD;  Location: [facility];  Service: Cardiovascular;  Laterality: N/A;  . INGUINAL HERNIA REPAIR Right 04/13/2012   Procedure: HERNIA REPAIR INGUINAL INCARCERATED;  Surgeon: Gayland Curry, MD;  Location: [facility];  Service: General;  Laterality: Right;  . INSERTION OF MESH Right 04/13/2012   Procedure: INSERTION OF MESH;  Surgeon: Gayland Curry, MD;  Location: [facility];  Service: General;  Laterality: Right;  . LEFT HEART CATH N/A 08/09/2018   Procedure: Left Heart Cath;  Surgeon: Jettie Booze, MD;  Location: [facility];  Service: Cardiovascular;  Laterality: N/A;  . LEFT HEART CATH AND CORONARY ANGIOGRAPHY N/A 08/08/2018   Procedure: LEFT HEART CATH AND CORONARY ANGIOGRAPHY;  Surgeon: Troy Sine, MD;  Location: [facility];  Service: Cardiovascular;  Laterality: N/A;  . VASECTOMY         Family History  Problem Relation Age of Onset   . Diabetes Mother   . Stroke Father   . Diabetes Sister   . Diabetes Brother     Social History   Tobacco Use  . Smoking status: Former Smoker    Packs/day: 1.00    Years: 15.00    Pack years: 15.00    Types: Cigarettes    Quit date: 03/29/1987    Years since quitting: 33.1  . Smokeless tobacco: Never Used  Substance Use Topics  . Alcohol use: No  . Drug use: No    Home Medications Prior to Admission medications   Medication Sig Start Date End Date Taking? Authorizing Provider  HYDROcodone-acetaminophen (NORCO/VICODIN) 5-325 MG tablet Take 1 tablet by mouth every 8 (eight) hours as needed for moderate pain or severe pain. 04/28/20  Yes Charlesetta Shanks, MD  HYDROcodone-acetaminophen (NORCO/VICODIN) 5-325 MG tablet Take 1 tablet by mouth every 6 (six) hours as needed for moderate pain or severe pain. Take 1 tablet in the morning and 1 tablet in the evening for pain control 04/28/20  Yes Simcha Speir, Jeannie Done, MD  atorvastatin (LIPITOR) 80 MG tablet Take 80 mg by mouth daily.    [provider]  cinacalcet (SENSIPAR) 30 MG tablet Take 30-60 mg by mouth See admin instructions. Take 2 tablets (60 mg) by mouth on Tuesday, Thursday, Saturday (dialysis days), take 1 tablet (30 mg) on Sunday, Monday, Wednesday, Friday (non-dialysis days)    [provider]  clopidogrel (PLAVIX) 75 MG tablet Take 1 tablet (75 mg total) by mouth daily with breakfast. 08/11/18   [name], MD  insulin NPH-regular Human (70-30) 100 UNIT/ML injection Inject 20 Units into the skin See admin instructions. Inject 20 units subcutaneously in the morning as needed for CBG >140, inject 20 units every evening    [provider]  isosorbide dinitrate (ISORDIL) 20 MG tablet Take 1 tablet (20 mg total) by mouth at bedtime. 05/15/19   Deboraha Sprang, MD  lidocaine-prilocaine (EMLA) cream Apply 1 application topically See admin instructions. Apply topically 30 minutes prior to dialysis on Tuesday,  Thursday, Saturday 11/15/18   [provider]  nitroGLYCERIN (NITROSTAT) 0.4 MG SL tablet Place 1 tablet (0.4 mg total) under the tongue every 5 (five) minutes x 3 doses as needed for chest pain. 08/10/18   [name], MD  Nutritional Supplements (NEPRO/CARBSTEADY PO) Take 237 mLs by mouth daily. vanilla    [provider]  sevelamer carbonate (RENVELA) 800 MG tablet Take 1,600 mg by mouth 3 (three) times daily. 05/22/19   [provider]    Allergies    Hydrochlorothiazide  Review of Systems   Review of Systems 10 systems reviewed and negative except as per HPI Physical Exam Updated Vital Signs BP 130/68   Pulse 88   Temp 97.9 F (36.6 C) (Oral)  Resp 20   Ht 5\' 8"  (1.727 m)   Wt 74.8 kg   SpO2 93%   BMI 25.07 kg/m   Physical Exam Constitutional:      Comments: Alert nontoxic and clinically well in appearance.  HENT:     Mouth/Throat:     Pharynx: Oropharynx is clear.  Eyes:     Extraocular Movements: Extraocular movements intact.  Neck:     Comments: Patient denies any pain to palpation of the bony prominences of the neck.  Range of motion intact of the neck without severe pain. Cardiovascular:     Rate and Rhythm: Normal rate and regular rhythm.  Pulmonary:     Effort: Pulmonary effort is normal.     Breath sounds: Normal breath sounds.  Abdominal:     General: There is no distension.     Palpations: Abdomen is soft.  Musculoskeletal:     Cervical back: Neck supple.     Comments: Patient has a dialysis fistula on the left upper arm.  The condition is very good.  Is clean and dry.  The remainder the arm is normal without edema.  Radial pulses are 2+ and symmetric.  Grip strengths are 2+ and symmetric.  Soft tissues are not tender to palpation.  Lower extremities without significant peripheral edema.  Calves soft and nontender.  Skin:    General: Skin is warm and dry.  Neurological:     General: No focal deficit present.      Mental Status: He is oriented to person, place, and time.     Sensory: No sensory deficit.     Motor: No weakness.     Coordination: Coordination normal.  Psychiatric:        Mood and Affect: Mood normal.     ED Results / Procedures / Treatments   Labs (all labs ordered are listed, but only abnormal results are displayed) Labs Reviewed - No data to display  EKG None  Radiology No results found.  Procedures Procedures   Medications Ordered in ED Medications  HYDROcodone-acetaminophen (NORCO/VICODIN) 5-325 MG per tablet 1 tablet (1 tablet Oral Given 04/28/20 2125)  acetaminophen (TYLENOL) tablet 325 mg (325 mg Oral Given 04/28/20 2125)    ED Course  I have reviewed the triage vital signs and the nursing notes.  Pertinent labs & imaging results that were available during my care of the patient were reviewed by me and considered in my medical decision making (see chart for details).    MDM Rules/Calculators/A&P                          Presents with sporadic lancinating pains that previously were confined to the right arm for a number of months.  Now he is occasionally getting them in the left as well.  Patient has reads from x-rays of both shoulder showing arthritic changes. patient has a normal neurovascular exam of the arms.  He was just dialyzed today.  His lungs are clear.  He has no complaints that sound cardiovascular in nature.  At this time I suspect he has radiculopathy.  Possibly spinal stenosis.  However, there is no weakness or motor dysfunction.  At this time will try Vicodin twice daily for pain with some additional acetaminophen as needed.  Patient is counseled on being conservative due to being on dialysis.  I have advised him to follow-up with PCP to discuss MRI of the cervical spine.  Also possible consideration for EMGs.  Patient is discharged in good condition.  Turn precautions reviewed. Final Clinical Impression(s) / ED Diagnoses Final diagnoses:  Pain in both  upper extremities  Cervical radiculopathy    Rx / DC Orders ED Discharge Orders         Ordered    HYDROcodone-acetaminophen (NORCO/VICODIN) 5-325 MG tablet  Every 8 hours PRN        04/28/20 2124    HYDROcodone-acetaminophen (NORCO/VICODIN) 5-325 MG tablet  Every 6 hours PRN        04/28/20 2129           Charlesetta Shanks, MD 04/28/20 2135

## 2020-04-28 NOTE — ED Notes (Signed)
Pt has had these spasms in arms for several months, they have progressively become worse.  Pt has no pain unless spasms occur.  Pt was started on Gabapentin Friday 100mg  qod, pt states it is not helping

## 2020-05-01 NOTE — Progress Notes (Signed)
[facility] Summary Report / Loop Recorder 

## 2020-05-18 ENCOUNTER — Ambulatory Visit (INDEPENDENT_AMBULATORY_CARE_PROVIDER_SITE_OTHER): Payer: Medicare Other

## 2020-05-18 DIAGNOSIS — R55 Syncope and collapse: Secondary | ICD-10-CM

## 2020-05-19 LAB — CUP PACEART REMOTE DEVICE CHECK
Date Time Interrogation Session: 20220424230459
Implantable Pulse Generator Implant Date: 20210412

## 2020-05-20 DIAGNOSIS — M1711 Unilateral primary osteoarthritis, right knee: Secondary | ICD-10-CM | POA: Diagnosis not present

## 2020-05-20 DIAGNOSIS — M1712 Unilateral primary osteoarthritis, left knee: Secondary | ICD-10-CM | POA: Diagnosis not present

## 2020-05-20 DIAGNOSIS — E1159 Type 2 diabetes mellitus with other circulatory complications: Secondary | ICD-10-CM | POA: Diagnosis not present

## 2020-05-20 DIAGNOSIS — B351 Tinea unguium: Secondary | ICD-10-CM | POA: Diagnosis not present

## 2020-05-20 DIAGNOSIS — L84 Corns and callosities: Secondary | ICD-10-CM | POA: Diagnosis not present

## 2020-05-20 DIAGNOSIS — M25511 Pain in right shoulder: Secondary | ICD-10-CM | POA: Diagnosis not present

## 2020-06-04 ENCOUNTER — Telehealth: Payer: Self-pay | Admitting: Interventional Cardiology

## 2020-06-04 ENCOUNTER — Inpatient Hospital Stay (HOSPITAL_BASED_OUTPATIENT_CLINIC_OR_DEPARTMENT_OTHER)
Admission: EM | Admit: 2020-06-04 | Discharge: 2020-06-08 | DRG: 280 | Disposition: A | Discharge status: Home / Self Care | Payer: Medicare Other | Attending: Internal Medicine | Admitting: Internal Medicine

## 2020-06-04 ENCOUNTER — Other Ambulatory Visit: Payer: Self-pay

## 2020-06-04 ENCOUNTER — Emergency Department (HOSPITAL_BASED_OUTPATIENT_CLINIC_OR_DEPARTMENT_OTHER): Payer: Medicare Other

## 2020-06-04 ENCOUNTER — Encounter (HOSPITAL_BASED_OUTPATIENT_CLINIC_OR_DEPARTMENT_OTHER): Payer: Self-pay

## 2020-06-04 DIAGNOSIS — Z79899 Other long term (current) drug therapy: Secondary | ICD-10-CM

## 2020-06-04 DIAGNOSIS — J9811 Atelectasis: Secondary | ICD-10-CM | POA: Diagnosis not present

## 2020-06-04 DIAGNOSIS — I252 Old myocardial infarction: Secondary | ICD-10-CM

## 2020-06-04 DIAGNOSIS — I5022 Chronic systolic (congestive) heart failure: Secondary | ICD-10-CM | POA: Diagnosis present

## 2020-06-04 DIAGNOSIS — Y831 Surgical operation with implant of artificial internal device as the cause of abnormal reaction of the patient, or of later complication, without mention of misadventure at the time of the procedure: Secondary | ICD-10-CM | POA: Diagnosis present

## 2020-06-04 DIAGNOSIS — I132 Hypertensive heart and chronic kidney disease with heart failure and with stage 5 chronic kidney disease, or end stage renal disease: Secondary | ICD-10-CM | POA: Diagnosis not present

## 2020-06-04 DIAGNOSIS — I214 Non-ST elevation (NSTEMI) myocardial infarction: Secondary | ICD-10-CM | POA: Diagnosis not present

## 2020-06-04 DIAGNOSIS — I25110 Atherosclerotic heart disease of native coronary artery with unstable angina pectoris: Secondary | ICD-10-CM | POA: Diagnosis present

## 2020-06-04 DIAGNOSIS — N2889 Other specified disorders of kidney and ureter: Secondary | ICD-10-CM | POA: Diagnosis present

## 2020-06-04 DIAGNOSIS — J9601 Acute respiratory failure with hypoxia: Secondary | ICD-10-CM | POA: Diagnosis not present

## 2020-06-04 DIAGNOSIS — E8889 Other specified metabolic disorders: Secondary | ICD-10-CM | POA: Diagnosis present

## 2020-06-04 DIAGNOSIS — E119 Type 2 diabetes mellitus without complications: Secondary | ICD-10-CM

## 2020-06-04 DIAGNOSIS — L409 Psoriasis, unspecified: Secondary | ICD-10-CM | POA: Diagnosis present

## 2020-06-04 DIAGNOSIS — J90 Pleural effusion, not elsewhere classified: Secondary | ICD-10-CM | POA: Diagnosis not present

## 2020-06-04 DIAGNOSIS — E7800 Pure hypercholesterolemia, unspecified: Secondary | ICD-10-CM | POA: Diagnosis present

## 2020-06-04 DIAGNOSIS — R079 Chest pain, unspecified: Secondary | ICD-10-CM | POA: Diagnosis not present

## 2020-06-04 DIAGNOSIS — D649 Anemia, unspecified: Secondary | ICD-10-CM

## 2020-06-04 DIAGNOSIS — I10 Essential (primary) hypertension: Secondary | ICD-10-CM | POA: Diagnosis present

## 2020-06-04 DIAGNOSIS — D638 Anemia in other chronic diseases classified elsewhere: Secondary | ICD-10-CM | POA: Diagnosis present

## 2020-06-04 DIAGNOSIS — I2510 Atherosclerotic heart disease of native coronary artery without angina pectoris: Secondary | ICD-10-CM | POA: Diagnosis present

## 2020-06-04 DIAGNOSIS — E1129 Type 2 diabetes mellitus with other diabetic kidney complication: Secondary | ICD-10-CM | POA: Diagnosis present

## 2020-06-04 DIAGNOSIS — Z7902 Long term (current) use of antithrombotics/antiplatelets: Secondary | ICD-10-CM

## 2020-06-04 DIAGNOSIS — Z9889 Other specified postprocedural states: Secondary | ICD-10-CM

## 2020-06-04 DIAGNOSIS — E1122 Type 2 diabetes mellitus with diabetic chronic kidney disease: Secondary | ICD-10-CM | POA: Diagnosis present

## 2020-06-04 DIAGNOSIS — K219 Gastro-esophageal reflux disease without esophagitis: Secondary | ICD-10-CM | POA: Diagnosis present

## 2020-06-04 DIAGNOSIS — Z992 Dependence on renal dialysis: Secondary | ICD-10-CM

## 2020-06-04 DIAGNOSIS — Z87891 Personal history of nicotine dependence: Secondary | ICD-10-CM

## 2020-06-04 DIAGNOSIS — I517 Cardiomegaly: Secondary | ICD-10-CM | POA: Diagnosis not present

## 2020-06-04 DIAGNOSIS — Z833 Family history of diabetes mellitus: Secondary | ICD-10-CM

## 2020-06-04 DIAGNOSIS — N186 End stage renal disease: Secondary | ICD-10-CM | POA: Diagnosis not present

## 2020-06-04 DIAGNOSIS — Z20822 Contact with and (suspected) exposure to covid-19: Secondary | ICD-10-CM | POA: Diagnosis not present

## 2020-06-04 DIAGNOSIS — T82855A Stenosis of coronary artery stent, initial encounter: Secondary | ICD-10-CM | POA: Diagnosis present

## 2020-06-04 DIAGNOSIS — Z85528 Personal history of other malignant neoplasm of kidney: Secondary | ICD-10-CM

## 2020-06-04 DIAGNOSIS — Z794 Long term (current) use of insulin: Secondary | ICD-10-CM

## 2020-06-04 DIAGNOSIS — Z888 Allergy status to other drugs, medicaments and biological substances status: Secondary | ICD-10-CM

## 2020-06-04 DIAGNOSIS — Z905 Acquired absence of kidney: Secondary | ICD-10-CM

## 2020-06-04 DIAGNOSIS — D631 Anemia in chronic kidney disease: Secondary | ICD-10-CM | POA: Diagnosis present

## 2020-06-04 DIAGNOSIS — E785 Hyperlipidemia, unspecified: Secondary | ICD-10-CM | POA: Diagnosis present

## 2020-06-04 DIAGNOSIS — Z823 Family history of stroke: Secondary | ICD-10-CM

## 2020-06-04 LAB — COMPREHENSIVE METABOLIC PANEL
ALT: 15 U/L (ref 0–44)
AST: 15 U/L (ref 15–41)
Albumin: 3.3 g/dL — ABNORMAL LOW (ref 3.5–5.0)
Alkaline Phosphatase: 79 U/L (ref 38–126)
Anion gap: 15 (ref 5–15)
BUN: 10 mg/dL (ref 8–23)
CO2: 28 mmol/L (ref 22–32)
Calcium: 8.4 mg/dL — ABNORMAL LOW (ref 8.9–10.3)
Chloride: 92 mmol/L — ABNORMAL LOW (ref 98–111)
Creatinine, Ser: 2.84 mg/dL — ABNORMAL HIGH (ref 0.61–1.24)
GFR, Estimated: 21 mL/min — ABNORMAL LOW (ref >60)
Glucose, Bld: 125 mg/dL — ABNORMAL HIGH (ref 70–99)
Potassium: 3.6 mmol/L (ref 3.5–5.1)
Sodium: 135 mmol/L (ref 135–145)
Total Bilirubin: 0.5 mg/dL (ref 0.3–1.2)
Total Protein: 7.7 g/dL (ref 6.5–8.1)

## 2020-06-04 LAB — CBC WITH DIFFERENTIAL/PLATELET
Abs Immature Granulocytes: 0.03 10*3/uL (ref 0.00–0.07)
Basophils Absolute: 0 10*3/uL (ref 0.0–0.1)
Basophils Relative: 0 %
Eosinophils Absolute: 0.1 10*3/uL (ref 0.0–0.5)
Eosinophils Relative: 1 %
HCT: 22.9 % — ABNORMAL LOW (ref 39.0–52.0)
Hemoglobin: 7.6 g/dL — ABNORMAL LOW (ref 13.0–17.0)
Immature Granulocytes: 0 %
Lymphocytes Relative: 9 %
Lymphs Abs: 0.9 10*3/uL (ref 0.7–4.0)
MCH: 30.2 pg (ref 26.0–34.0)
MCHC: 33.2 g/dL (ref 30.0–36.0)
MCV: 90.9 fL (ref 80.0–100.0)
Monocytes Absolute: 0.9 10*3/uL (ref 0.1–1.0)
Monocytes Relative: 9 %
Neutro Abs: 8.1 10*3/uL — ABNORMAL HIGH (ref 1.7–7.7)
Neutrophils Relative %: 81 %
Platelets: 320 10*3/uL (ref 150–400)
RBC: 2.52 MIL/uL — ABNORMAL LOW (ref 4.22–5.81)
RDW: 14.9 % (ref 11.5–15.5)
WBC: 10 10*3/uL (ref 4.0–10.5)
nRBC: 0 % (ref 0.0–0.2)

## 2020-06-04 LAB — TROPONIN I (HIGH SENSITIVITY)
Troponin I (High Sensitivity): 15 ng/L (ref <18)
Troponin I (High Sensitivity): 25 ng/L — ABNORMAL HIGH (ref <18)
Troponin I (High Sensitivity): 32 ng/L — ABNORMAL HIGH (ref <18)

## 2020-06-04 LAB — RESP PANEL BY RT-PCR (FLU A&B, COVID) ARPGX2
Influenza A by PCR: NEGATIVE
Influenza B by PCR: NEGATIVE
SARS Coronavirus 2 by RT PCR: NEGATIVE

## 2020-06-04 LAB — BRAIN NATRIURETIC PEPTIDE: B Natriuretic Peptide: 268.2 pg/mL — ABNORMAL HIGH (ref 0.0–100.0)

## 2020-06-04 IMAGING — DX DG CHEST 1V PORT
1 series · 1 of 1 positions shown · non-contrast
Comparison: Chest radiograph dated [DATE] and chest CT dated
[DATE].

CLINICAL DATA: Chest pain

EXAM:
PORTABLE CHEST 1 VIEW

[chest ap]
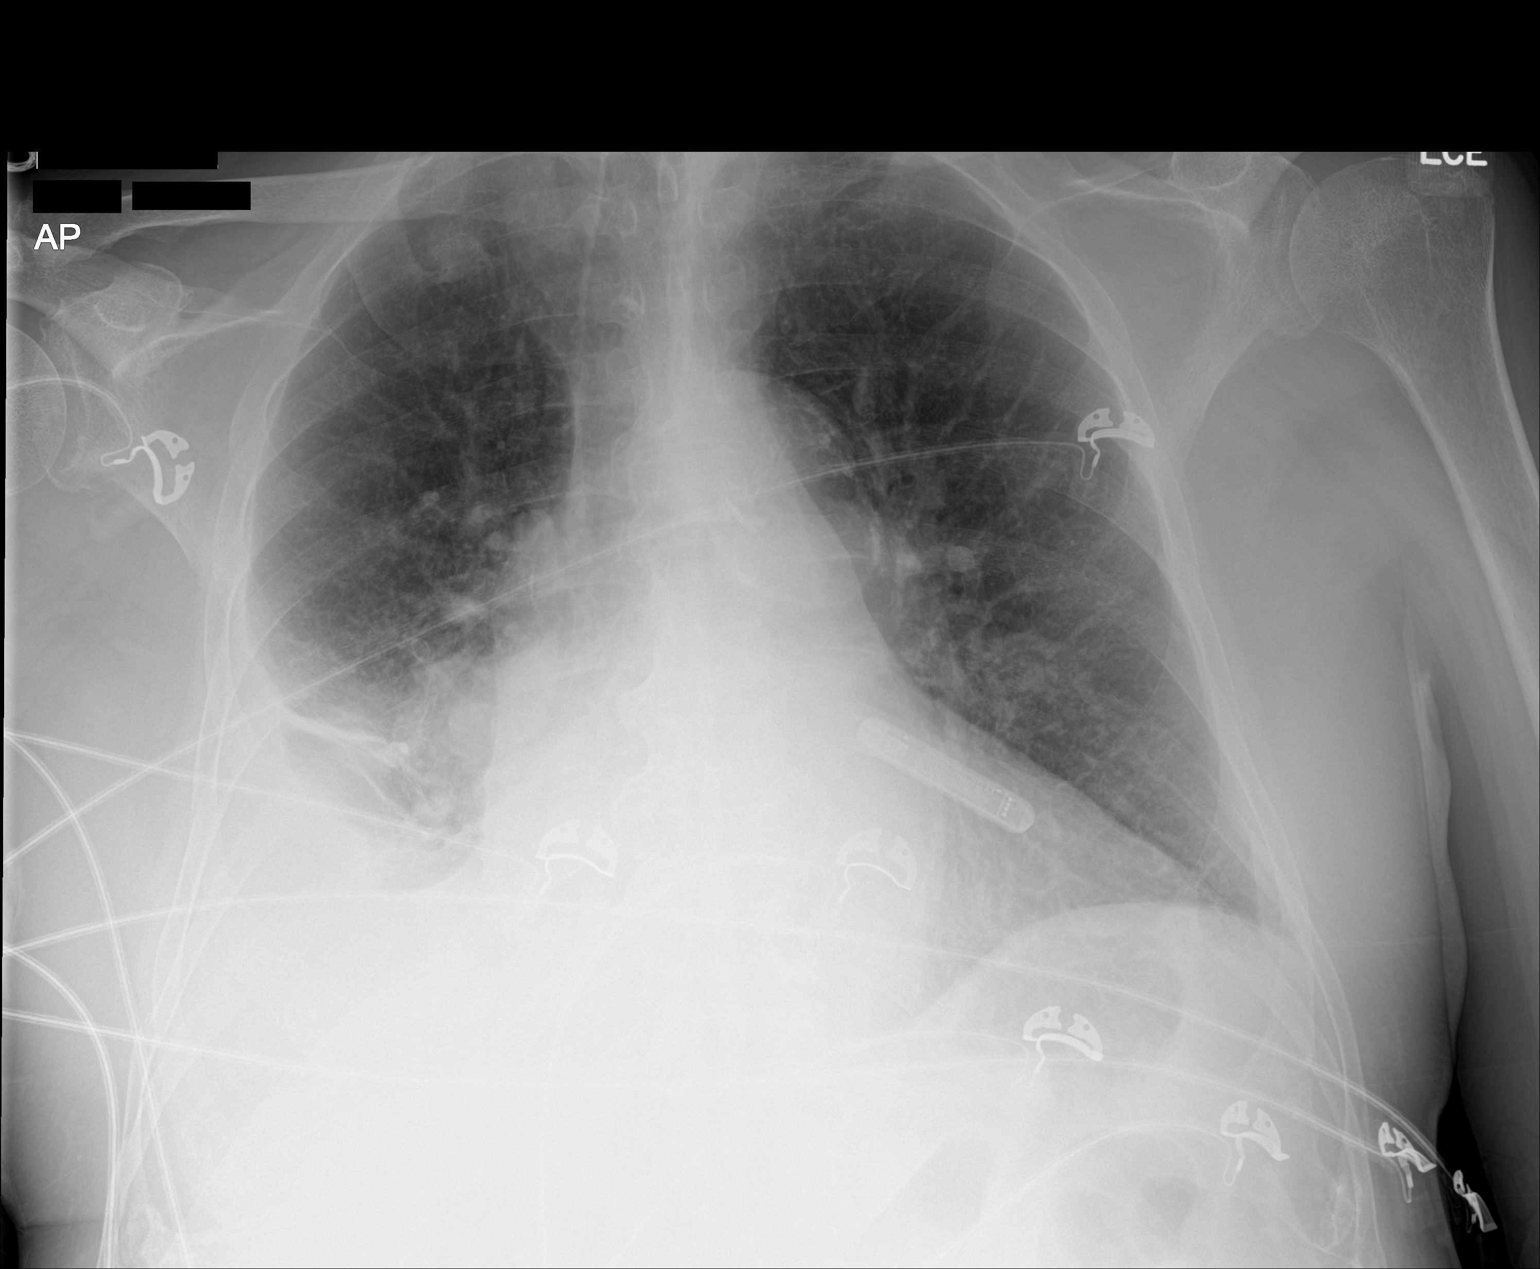

[1 of 1 positions shown; findings below may reference images not displayed]

FINDINGS: The heart is enlarged. Vascular calcifications are seen in the
aortic arch. A cardiac loop recorder overlies the left chest. A
moderate right pleural effusion with associated atelectasis/airspace
disease is noted. There is mild left basilar atelectasis. There is
no significant left pleural effusion. There is no pneumothorax on
either side. Degenerative changes are seen in the spine.
IMPRESSION: Moderate right pleural effusion with associated atelectasis/airspace
disease. Cardiomegaly.

Aortic Atherosclerosis ([WV]-[WV]).

## 2020-06-04 MED ORDER — NITROGLYCERIN 0.4 MG SL SUBL
0.4000 mg | SUBLINGUAL_TABLET | SUBLINGUAL | Status: DC | PRN
  Administered 2020-06-04: 0.4 mg via SUBLINGUAL
  Filled 2020-06-04: qty 1

## 2020-06-04 MED ORDER — HEPARIN (PORCINE) 25000 UT/250ML-% IV SOLN
950.0000 [IU]/h | INTRAVENOUS | Status: DC
  Administered 2020-06-04: 950 [IU]/h via INTRAVENOUS
  Filled 2020-06-04: qty 250

## 2020-06-04 MED ORDER — ASPIRIN 325 MG PO TABS
325.0000 mg | ORAL_TABLET | Freq: Once | ORAL | Status: AC
  Administered 2020-06-04: 325 mg via ORAL
  Filled 2020-06-04: qty 1

## 2020-06-04 MED ORDER — NITROGLYCERIN IN D5W 200-5 MCG/ML-% IV SOLN
0.0000 ug/min | INTRAVENOUS | Status: DC
  Filled 2020-06-04: qty 250

## 2020-06-04 MED ORDER — HEPARIN BOLUS VIA INFUSION
4000.0000 [IU] | Freq: Once | INTRAVENOUS | Status: AC
  Administered 2020-06-04: 4000 [IU] via INTRAVENOUS

## 2020-06-04 MED ORDER — NITROGLYCERIN 0.4 MG SL SUBL: 0.4000 mg | SUBLINGUAL_TABLET | SUBLINGUAL | Status: DC | PRN

## 2020-06-04 NOTE — ED Notes (Signed)
ED Provider at bedside. 

## 2020-06-04 NOTE — Telephone Encounter (Signed)
Left detailed message letting pt know to mention symptoms to dialysis center/Nephro as well as he may be volume overloaded.  Advised to keep appt tomorrow.

## 2020-06-04 NOTE — Progress Notes (Signed)
Patient accepted to [facility] cardiac tele for obs. Hospitalist team to assume care upon arrival to accepting facility. Cardiology consulted by ED, plans to see patient in consult. Advised ER provider to continue to trend troponins. Patient chest pain free after 4 total nitros.

## 2020-06-04 NOTE — ED Notes (Signed)
Assessed PT on RA- Sp02 100% , HR103, RR 18, BBS clear- diminished in bases. PT does not appear to be in respiratory distress at this time.

## 2020-06-04 NOTE — Telephone Encounter (Signed)
Spoke with pt who complains of intermittent SOB on exertion over the last few weeks.  Pt denies edema.  He also reports over the last week waking in the early morning hours with some "chest discomfort" that resolves with 1 nitro tablet.  Pt states his Isordil is no longer working as it used to.  He has had some nausea since last Friday with decreased appetite but no vomiting.  Pt receives dialysis.  He denies current active CP or SOB.   Appointment scheduled with Dr Acie Fredrickson, DOD for 06/05/2020 due to Dr Tamala Julian nor APP having availability.  Reviewed ED precautions.  Pt verbalizes understanding and agrees with current plan.

## 2020-06-04 NOTE — Telephone Encounter (Signed)
Thanks. He also needs to call the dialysis center and or Nephrologist. He may be volume overloaded.

## 2020-06-04 NOTE — ED Notes (Signed)
Pt denies any pain or discomfort at this time.  Darl Householder, Md made aware and advises to hold Nitro gtt at this time.  States to give Nitro gtt if pt starts complaining of pain or discomfort again.

## 2020-06-04 NOTE — ED Triage Notes (Signed)
Pt arrives with c/o CP states he was having CP while at dialysis today reports he did not tell staff but did take 3 Nitro while there, about 1 per hour. Pt did receive full dialysis treatment today. Pt reports he goes Tuesday Thursday Saturday and has been compliant with going.

## 2020-06-04 NOTE — Telephone Encounter (Signed)
Pt c/o Shortness Of Breath: STAT if SOB developed within the last 24 hours or pt is noticeably SOB on the phone  1. Are you currently SOB (can you hear that pt is SOB on the phone)? No  2. How long have you been experiencing SOB? Moving around  3. Are you SOB when sitting or when up moving around?  Moving around  4. Are you currently experiencing any other symptoms? No    Also mentioned that the medication isosorbide dinitrate (ISORDIL) 20 MG tablet isn't working like its suppose to

## 2020-06-04 NOTE — Consult Note (Signed)
Cardiology Consultation:   Patient ID: Nicholas [name] MRN: [id number]; DOB: Aug 04, 1930  Admit date: 06/04/2020 Date of Consult: 06/05/2020  Primary Care Provider: Clinic, [facility] Cardiologist: Sinclair Grooms, MD  [place] Electrophysiologist:  None   Patient Profile:   Nicholas [name] is a 87 y.o. male with CAD s/p PCI (07/2018, pLAD), ESRD/iHD (T/R/Sa), HLD, and HTN who presented to [facility] ED with substernal chest pressure since 05/12.   History of Present Illness:   Nicholas [name] has had ongoing chest pain since 05/12 prior to his iHD. He called for an appointment with Dr. Tamala Julian who asked him to present to the ED for evaluation. He continues on plavix and is not currently taking ASA. He was normotensive on ED evaluation (BP 133/71) and was CP free prior to transport. He has been ASA loaded (324 mg PO at 1703) and given additional NG (0.4 mg SLNG at 2109) before transport.   Nicholas [name] reports that over the past year he has had fairly consistent exertional anginal symptoms that are always relieved with rest and occasional nitroglycerin.  He takes a few nitroglycerin a week for this pain.  He goes to dialysis Tuesday, Thursday and Saturday.  Prior to dialysis on Thursday he noticed an acute onset central nonradiating fairly severe chest discomfort which was similar to his prior anginal episodes.  He took 1 sublingual nitroglycerin with symptom relief.  He then went on for dialysis and had several additional episodes which all abated with nitroglycerin.  After coming to the ED he remained chest pain-free.  He presented to the ED because he was concerned that his chest pain was occurring at rest unlike other episodes.  He denies any orthopnea, PND, weight gain or decreased urine output.  Past Medical History:  Diagnosis Date  . Arthritis   . Chronic kidney disease   . Diabetes mellitus ([place])   . Dialysis patient ([place])   . GERD (gastroesophageal reflux disease)    occ   . Heart murmur   . Hypercholesteremia   . Hypertension   . Psoriasis    Past Surgical History:  Procedure Laterality Date  . CARDIAC CATHETERIZATION  09   "some blockage" with collaterals by cath at [facility] ~ 2009; no intervention required; reportedly, no routine cardiology f/u recommended   . CORONARY ATHERECTOMY N/A 08/09/2018   Procedure: CORONARY ATHERECTOMY;  Surgeon: Jettie Booze, MD;  Location: [place] [facility];  Service: Cardiovascular;  Laterality: N/A;  . CORONARY STENT INTERVENTION N/A 08/09/2018   Procedure: CORONARY STENT INTERVENTION;  Surgeon: Jettie Booze, MD;  Location: [place] [facility];  Service: Cardiovascular;  Laterality: N/A;  . INGUINAL HERNIA REPAIR Right 04/13/2012   Procedure: HERNIA REPAIR INGUINAL INCARCERATED;  Surgeon: Gayland Curry, MD;  Location: [place];  Service: General;  Laterality: Right;  . INSERTION OF MESH Right 04/13/2012   Procedure: INSERTION OF MESH;  Surgeon: Gayland Curry, MD;  Location: [place];  Service: General;  Laterality: Right;  . LEFT HEART CATH N/A 08/09/2018   Procedure: Left Heart Cath;  Surgeon: Jettie Booze, MD;  Location: [place] [facility];  Service: Cardiovascular;  Laterality: N/A;  . LEFT HEART CATH AND CORONARY ANGIOGRAPHY N/A 08/08/2018   Procedure: LEFT HEART CATH AND CORONARY ANGIOGRAPHY;  Surgeon: Troy Sine, MD;  Location: [place] [facility];  Service: Cardiovascular;  Laterality: N/A;  . VASECTOMY      Home Medications:  Prior to Admission medications  Medication Sig Start Date End Date Taking? Authorizing Provider  atorvastatin (LIPITOR) 80 MG tablet Take 80 mg by mouth daily.    [provider]  cinacalcet (SENSIPAR) 30 MG tablet Take 30-60 mg by mouth See admin instructions. Take 2 tablets (60 mg) by mouth on Tuesday, Thursday, Saturday (dialysis days), take 1 tablet (30 mg) on Sunday, Monday, Wednesday, Friday (non-dialysis days)    [provider]  clopidogrel (PLAVIX) 75 MG  tablet Take 1 tablet (75 mg total) by mouth daily with breakfast. 08/11/18   [name], MD  HYDROcodone-acetaminophen (NORCO/VICODIN) 5-325 MG tablet Take 1 tablet by mouth every 8 (eight) hours as needed for moderate pain or severe pain. 04/28/20   Charlesetta Shanks, MD  HYDROcodone-acetaminophen (NORCO/VICODIN) 5-325 MG tablet Take 1 tablet by mouth every 6 (six) hours as needed for moderate pain or severe pain. Take 1 tablet in the morning and 1 tablet in the evening for pain control 04/28/20   Charlesetta Shanks, MD  insulin NPH-regular Human (70-30) 100 UNIT/ML injection Inject 20 Units into the skin See admin instructions. Inject 20 units subcutaneously in the morning as needed for CBG >140, inject 20 units every evening    [provider]  isosorbide dinitrate (ISORDIL) 20 MG tablet Take 1 tablet (20 mg total) by mouth at bedtime. 05/15/19   Deboraha Sprang, MD  lidocaine-prilocaine (EMLA) cream Apply 1 application topically See admin instructions. Apply topically 30 minutes prior to dialysis on Tuesday, Thursday, Saturday 11/15/18   [provider]  nitroGLYCERIN (NITROSTAT) 0.4 MG SL tablet Place 1 tablet (0.4 mg total) under the tongue every 5 (five) minutes x 3 doses as needed for chest pain. 08/10/18   [name], MD  Nutritional Supplements (NEPRO/CARBSTEADY PO) Take 237 mLs by mouth daily. vanilla    [provider]  sevelamer carbonate (RENVELA) 800 MG tablet Take 1,600 mg by mouth 3 (three) times daily. 05/22/19   [provider]   Inpatient Medications: Scheduled Meds: . alum & mag hydroxide-simeth  30 mL Oral Once   And  . lidocaine  15 mL Oral Once  . [START ON 06/06/2020] aspirin  81 mg Oral Pre-Cath  . sodium chloride flush  3 mL Intravenous Q12H   Continuous Infusions: . sodium chloride    . [START ON 06/06/2020] sodium chloride    . heparin 950 Units/hr (06/04/20 2146)   PRN Meds: sodium chloride, acetaminophen, nitroGLYCERIN,  ondansetron (ZOFRAN) IV, sodium chloride flush  Allergies:    Allergies  Allergen Reactions  . Hydrochlorothiazide Other (See Comments)    Unknown [facility]    Social History:   Social History   Socioeconomic History  . Marital status: Married    Spouse name: Not on file  . Number of children: Not on file  . Years of education: Not on file  . Highest education level: Not on file  Occupational History  . Not on file  Tobacco Use  . Smoking status: Former Smoker    Packs/day: 1.00    Years: 15.00    Pack years: 15.00    Types: Cigarettes    Quit date: 03/29/1987    Years since quitting: 33.2  . Smokeless tobacco: Never Used  Substance and Sexual Activity  . Alcohol use: No  . Drug use: No  . Sexual activity: Not on file  Other Topics Concern  . Not on file  Social History Narrative  . Not on file   Social Determinants of Health  Financial Resource Strain: Not on file  Food Insecurity: Not on file  Transportation Needs: Not on file  Physical Activity: Not on file  Stress: Not on file  Social Connections: Not on file  Intimate Partner Violence: Not on file    Family History:    Family History  Problem Relation Age of Onset  . Diabetes Mother   . Stroke Father   . Diabetes Sister   . Diabetes Brother     ROS:  Review of Systems: [y] = yes, [ ]  = no       General: Weight gain [ ] ; Weight loss [ ] ; Anorexia [ ] ; Fatigue [ ] ; Fever [ ] ; Chills [ ] ; Weakness [ ]     Cardiac: Chest pain/pressure [x] ; Resting SOB [ ] ; Exertional SOB [ ] ; Orthopnea [ ] ; Pedal Edema [ ] ; Palpitations [ ] ; Syncope [ ] ; Presyncope [ ] ; Paroxysmal nocturnal dyspnea [ ]     Pulmonary: Cough [ ] ; Wheezing [ ] ; Hemoptysis [ ] ; Sputum [ ] ; Snoring [ ]   GI: Vomiting [ ] ; Dysphagia [ ] ; Melena [ ] ; Hematochezia [ ] ; Heartburn [ ] ; Abdominal pain [ ] ; Constipation [ ] ; Diarrhea [ ] ; BRBPR [ ]     GU: Hematuria [ ] ; Dysuria [ ] ; Nocturia [ ]   Vascular: Pain in legs with walking [ ] ;  Pain in feet with lying flat [ ] ; Non-healing sores [ ] ; Stroke [ ] ; TIA [ ] ; Slurred speech [ ] ;    Neuro: Headaches [ ] ; Vertigo [ ] ; Seizures [ ] ; Paresthesias [ ] ;Blurred vision [ ] ; Diplopia [ ] ; Vision changes [ ]     Ortho/Skin: Arthritis [ ] ; Joint pain [ ] ; Muscle pain [ ] ; Joint swelling [ ] ; Back Pain [ ] ; Rash [ ]     Psych: Depression [ ] ; Anxiety [ ]     Heme: Bleeding problems [ ] ; Clotting disorders [ ] ; Anemia [ ]     Endocrine: Diabetes [ ] ; Thyroid dysfunction [ ]    Physical Exam/Data:   Vitals:   06/04/20 2356 06/05/20 0004 06/05/20 0005 06/05/20 0415  BP:   137/74 120/65  Pulse:    79  Resp:    16  Temp:  98 F (36.7 C)  97.7 F (36.5 C)  TempSrc:  Oral  Oral  SpO2:  99%  98%  Weight: 72.4 kg     Height: 5\' 8"  (1.727 m)       Intake/Output Summary (Last 24 hours) at 06/05/2020 0852 Last data filed at 06/05/2020 0300 Gross per 24 hour  Intake 28.5 ml  Output --  Net 28.5 ml   Last 3 Weights 06/04/2020 06/04/2020 04/28/2020  Weight (lbs) 159 lb 11.2 oz 158 lb 11.7 oz 164 lb 14.5 oz  Weight (kg) 72.439 kg 72 kg 74.8 kg     Body mass index is 24.28 kg/m.  General:  Well nourished, well developed, in no acute distress HEENT: normal Lymph: no adenopathy Neck: no JVD Endocrine:  No thryomegaly Vascular: No carotid bruits; FA pulses 2+ bilaterally without bruits  Cardiac:  normal S1, S2; RRR; no murmur  Lungs:  clear to auscultation bilaterally, no wheezing, rhonchi or rales  Abd: soft, nontender, no hepatomegaly  Ext: no edema Musculoskeletal:  No deformities, BUE and BLE strength normal and equal Skin: LUE fistula  Neuro:  CNs 2-12 intact, no focal abnormalities noted Psych:  Normal affect   EKG:  The EKG was personally reviewed and demonstrates: NSR, lateral leads ST depressions  Telemetry:  Telemetry was personally reviewed and demonstrates: NSR  Relevant CV Studies:  TTE Result date: 03/18/19 1. Left ventricular ejection fraction, by estimation,  is 60 to 65%. The  left ventricle has normal function. The left ventricle has no regional  wall motion abnormalities. Left ventricular diastolic parameters are  consistent with Grade I diastolic  dysfunction (impaired relaxation). Elevated left ventricular end-diastolic  pressure.  2. Right ventricular systolic function is normal. The right ventricular  size is normal. There is normal pulmonary artery systolic pressure.  3. The mitral valve is normal in structure and function. No evidence of  mitral valve regurgitation. No evidence of mitral stenosis.  4. The aortic valve is tricuspid. Aortic valve regurgitation is mild.  Mild aortic valve sclerosis is present, with no evidence of aortic valve  stenosis.  5. The inferior vena cava is normal in size with greater than 50%  respiratory variability, suggesting right atrial pressure of 3 mmHg.   Coronary angiography Result date: 08/09/18  Prox LAD lesion is 90% stenosed.  A drug-eluting stent was successfully placed using a STENT SYNERGY DES [id number].  1st Diag lesion is 80% stenosed and jailed by the stent. TIMIT 1 flow.  1st Mrg lesion is 100% stenosed.  Prox Cx lesion is 80% stenosed.  Post intervention, there is a 0% residual stenosis.  LV end diastolic pressure is normal.  There is no aortic valve stenosis.   Laboratory Data:  High Sensitivity Troponin:   Recent Labs  Lab 06/04/20 1650 06/04/20 2011 06/04/20 2210 06/05/20 0001  TROPONINIHS 15 25* 32* 40*     Chemistry Recent Labs  Lab 06/04/20 1650  NA 135  K 3.6  CL 92*  CO2 28  GLUCOSE 125*  BUN 10  CREATININE 2.84*  CALCIUM 8.4*  GFRNONAA 21*  ANIONGAP 15    Recent Labs  Lab 06/04/20 1650  PROT 7.7  ALBUMIN 3.3*  AST 15  ALT 15  ALKPHOS 79  BILITOT 0.5   Hematology Recent Labs  Lab 06/04/20 1650 06/05/20 0001  WBC 10.0 8.2  RBC 2.52* 2.57*  HGB 7.6* 7.7*  HCT 22.9* 23.7*  MCV 90.9 92.2  MCH 30.2 30.0  MCHC 33.2 32.5  RDW 14.9  14.7  PLT 320 308   BNP Recent Labs  Lab 06/04/20 1650  BNP 268.2*    DDimer No results for input(s): DDIMER in the last 168 hours.  Radiology/Studies:  DG Chest Port 1 View  Result Date: 06/04/2020 CLINICAL DATA:  Chest pain EXAM: PORTABLE CHEST 1 VIEW COMPARISON:  Chest radiograph dated 03/17/2019 and chest CT dated 03/25/2020. FINDINGS: The heart is enlarged. Vascular calcifications are seen in the aortic arch. A cardiac loop recorder overlies the left chest. A moderate right pleural effusion with associated atelectasis/airspace disease is noted. There is mild left basilar atelectasis. There is no significant left pleural effusion. There is no pneumothorax on either side. Degenerative changes are seen in the spine. IMPRESSION: Moderate right pleural effusion with associated atelectasis/airspace disease. Cardiomegaly. Aortic Atherosclerosis (ICD10-I70.0). Electronically Signed   By: Zerita Boers M.D.   On: 06/04/2020 16:36   TIMI Risk Score for Unstable Angina or Non-ST Elevation MI:   The patient's TIMI risk score is 5, which indicates a 26% risk of all cause mortality, new or recurrent myocardial infarction or need for urgent revascularization in the next 14 days   Assessment and Plan:   Nicholas [name] is a 87 y.o. male with CAD s/p PCI (07/2018, pLAD), ESRD/iHD (T/R/Sa), HLD, and HTN  who presented to [facility] ED with substernal chest pressure similar to his prior anginal equivalents.  He had revascularization of his proximal LAD in 07/2018 but had residual disease in his circumflex and his D1 was jailed by his stent.  He has dealt with manageable exertional angina over the past year however he was concerned that his angina was at rest and recurrent for several episodes prior to presentation to the ED.  He only has a mild troponin elevation however in the setting of known residual CAD I think it is reasonable to consider repeat coronary angiography.  He is currently on Plavix but off of aspirin.   He has a left upper extremity fistula but no prior fistulas on his right arm.  Coronary CT would be less helpful given his known multivessel CAD. He has chronic anemia but Hb has been stable, likely related to ESRD, plt WNLs. He has hx of diverticulitis with prior bleeding with capsule endoscopy earlier this year that was reportedly nl, ASA was d/c earlier in the year.  - continue plavix (home), s/p asa load, continue daily - continue heparin gtt - repeat echo today - npo for possible cath today, need to ensure he can tolerate DAPT given his bleeding hx per chart review   For questions or updates, please contact [facility] Please consult [email or web address] for contact info under   Signed, Dion Body, MD  06/05/2020 8:52 AM

## 2020-06-04 NOTE — Progress Notes (Signed)
ANTICOAGULATION CONSULT NOTE - Initial Consult  Pharmacy Consult for heparin Indication: chest pain/ACS  Allergies  Allergen Reactions  . Hydrochlorothiazide Other (See Comments)    Unknown [facility]    Patient Measurements: Height: 5\' 8"  (172.7 cm) Weight: 72 kg (158 lb 11.7 oz) IBW/kg (Calculated) : 68.4 Heparin Dosing Weight: 72 kg   Vital Signs: Temp: 98.1 F (36.7 C) (05/12 1610) Temp Source: Oral (05/12 1610) BP: 128/66 (05/12 2100) Pulse Rate: 103 (05/12 2100)  Labs: Recent Labs    06/04/20 1650 06/04/20 2011  HGB 7.6*  --   HCT 22.9*  --   PLT 320  --   CREATININE 2.84*  --   TROPONINIHS 15 25*    Estimated Creatinine Clearance: 17.7 mL/min (A) (by C-G formula based on SCr of 2.84 mg/dL (H)).   Medical History: Past Medical History:  Diagnosis Date  . Arthritis   . Chronic kidney disease   . Diabetes mellitus ([place])   . Dialysis patient ([place])   . GERD (gastroesophageal reflux disease)    occ  . Heart murmur   . Hypercholesteremia   . Hypertension   . Psoriasis     Medications:  (Not in a hospital admission)   Assessment: 58 YOM who presented with substernal chest pain and troponins trending up. Pharmacy consulted to start IV heparin for ACS.   H/H low. Plt wnl. On HD at home   Goal of Therapy:  Heparin level 0.3-0.7 units/ml Monitor platelets by anticoagulation protocol: Yes   Plan:  -Start heparin 4000 units IV bolus followed by heparin infusion at 950 units/hr -F/u 8 hr HL -Monitor daily HL, CBC and s/s of bleeding   Albertina Parr, PharmD., BCPS, BCCCP Clinical Pharmacist Please refer to [facility] for unit-specific pharmacist

## 2020-06-04 NOTE — ED Provider Notes (Signed)
[name] Provider Note   CSN: [id number] Arrival date & time: 06/04/20  1557     History Chief Complaint  Patient presents with  . Chest Pain    [name] is a 87 y.o. male history of diabetes, CKD on dialysis, CAD status post stent 2 years ago, heart failure here presenting with chest pain.  Patient had substernal chest pain since this morning.  He states that it is a pressure sensation.  He took 3 nitros and the pain is still 5 out of 10.  He actually called Dr. Tamala Julian for appointment and told about chest pain he was sent to the ED for further evaluation.  Patient had cardiac stents done to years ago.  Patient is on Plavix but no aspirin.  Patient is not on any blood thinners.   The history is provided by the patient.       Past Medical History:  Diagnosis Date  . Arthritis   . Chronic kidney disease   . Diabetes mellitus ([place])   . Dialysis patient ([place])   . GERD (gastroesophageal reflux disease)    occ  . Heart murmur   . Hypercholesteremia   . Hypertension   . Psoriasis     Patient Active Problem List   Diagnosis Date Noted  . Hypotension   . Syncope and collapse 03/17/2019  . Symptomatic anemia 09/28/2018  . GIB (gastrointestinal bleeding) 09/27/2018  . Anemia due to blood loss 09/27/2018  . Type II diabetes mellitus with renal manifestations ([place]) 09/27/2018  . GERD (gastroesophageal reflux disease) 09/27/2018  . CAD (coronary artery disease) 09/27/2018  . Chronic systolic CHF (congestive heart failure) ([place]) 09/27/2018  . NSTEMI (non-ST elevated myocardial infarction) ([place]) 08/06/2018  . Diabetes mellitus ([place])   . Hypertension   . Hypercholesteremia   . ESRD (end stage renal disease) on dialysis ([place])   . Anemia of chronic disease   . Lung nodule seen on imaging study 04/30/2012    Past Surgical History:  Procedure Laterality Date  . CARDIAC CATHETERIZATION  09   "some blockage" with collaterals by cath at [facility] ~  2009; no intervention required; reportedly, no routine cardiology f/u recommended   . CORONARY ATHERECTOMY N/A 08/09/2018   Procedure: CORONARY ATHERECTOMY;  Surgeon: Jettie Booze, MD;  Location: [place] [facility];  Service: Cardiovascular;  Laterality: N/A;  . CORONARY STENT INTERVENTION N/A 08/09/2018   Procedure: CORONARY STENT INTERVENTION;  Surgeon: Jettie Booze, MD;  Location: [place] [facility];  Service: Cardiovascular;  Laterality: N/A;  . INGUINAL HERNIA REPAIR Right 04/13/2012   Procedure: HERNIA REPAIR INGUINAL INCARCERATED;  Surgeon: Gayland Curry, MD;  Location: [place];  Service: General;  Laterality: Right;  . INSERTION OF MESH Right 04/13/2012   Procedure: INSERTION OF MESH;  Surgeon: Gayland Curry, MD;  Location: [place];  Service: General;  Laterality: Right;  . LEFT HEART CATH N/A 08/09/2018   Procedure: Left Heart Cath;  Surgeon: Jettie Booze, MD;  Location: [place] [facility];  Service: Cardiovascular;  Laterality: N/A;  . LEFT HEART CATH AND CORONARY ANGIOGRAPHY N/A 08/08/2018   Procedure: LEFT HEART CATH AND CORONARY ANGIOGRAPHY;  Surgeon: Troy Sine, MD;  Location: [place] [facility];  Service: Cardiovascular;  Laterality: N/A;  . VASECTOMY         Family History  Problem Relation Age of Onset  . Diabetes Mother   . Stroke Father   . Diabetes Sister   .  Diabetes Brother     Social History   Tobacco Use  . Smoking status: Former Smoker    Packs/day: 1.00    Years: 15.00    Pack years: 15.00    Types: Cigarettes    Quit date: 03/29/1987    Years since quitting: 33.2  . Smokeless tobacco: Never Used  Substance Use Topics  . Alcohol use: No  . Drug use: No    Home Medications Prior to Admission medications   Medication Sig Start Date End Date Taking? Authorizing Provider  atorvastatin (LIPITOR) 80 MG tablet Take 80 mg by mouth daily.    [provider]  cinacalcet (SENSIPAR) 30 MG tablet Take 30-60 mg by mouth See admin  instructions. Take 2 tablets (60 mg) by mouth on Tuesday, Thursday, Saturday (dialysis days), take 1 tablet (30 mg) on Sunday, Monday, Wednesday, Friday (non-dialysis days)    [provider]  clopidogrel (PLAVIX) 75 MG tablet Take 1 tablet (75 mg total) by mouth daily with breakfast. 08/11/18   [name], MD  HYDROcodone-acetaminophen (NORCO/VICODIN) 5-325 MG tablet Take 1 tablet by mouth every 8 (eight) hours as needed for moderate pain or severe pain. 04/28/20   Charlesetta Shanks, MD  HYDROcodone-acetaminophen (NORCO/VICODIN) 5-325 MG tablet Take 1 tablet by mouth every 6 (six) hours as needed for moderate pain or severe pain. Take 1 tablet in the morning and 1 tablet in the evening for pain control 04/28/20   Charlesetta Shanks, MD  insulin NPH-regular Human (70-30) 100 UNIT/ML injection Inject 20 Units into the skin See admin instructions. Inject 20 units subcutaneously in the morning as needed for CBG >140, inject 20 units every evening    [provider]  isosorbide dinitrate (ISORDIL) 20 MG tablet Take 1 tablet (20 mg total) by mouth at bedtime. 05/15/19   Deboraha Sprang, MD  lidocaine-prilocaine (EMLA) cream Apply 1 application topically See admin instructions. Apply topically 30 minutes prior to dialysis on Tuesday, Thursday, Saturday 11/15/18   [provider]  nitroGLYCERIN (NITROSTAT) 0.4 MG SL tablet Place 1 tablet (0.4 mg total) under the tongue every 5 (five) minutes x 3 doses as needed for chest pain. 08/10/18   [name], MD  Nutritional Supplements (NEPRO/CARBSTEADY PO) Take 237 mLs by mouth daily. vanilla    [provider]  sevelamer carbonate (RENVELA) 800 MG tablet Take 1,600 mg by mouth 3 (three) times daily. 05/22/19   [provider]    Allergies    Hydrochlorothiazide  Review of Systems   Review of Systems  Cardiovascular: Positive for chest pain.  All other systems reviewed and are negative.   Physical  Exam Updated Vital Signs BP 133/71   [name] 91   Temp 98.1 F (36.7 C) (Oral)   Resp (!) 23   Ht 5\' 8"  (1.727 m)   Wt 72 kg   SpO2 92%   BMI 24.14 kg/m   Physical Exam Vitals and nursing note reviewed.  Constitutional:      Comments: Chronically ill   HENT:     Head: Normocephalic.  Eyes:     Extraocular Movements: Extraocular movements intact.     Pupils: Pupils are equal, round, and reactive to light.  Cardiovascular:     Rate and Rhythm: Normal rate and regular rhythm.     Heart sounds: Normal heart sounds.  Pulmonary:     Effort: Pulmonary effort is normal.     Breath sounds: Normal breath sounds.  Abdominal:     General: Bowel  sounds are normal.     Palpations: Abdomen is soft.  Musculoskeletal:        General: Normal range of motion.     Cervical back: Normal range of motion.  Skin:    General: Skin is warm.     Capillary Refill: Capillary refill takes less than 2 seconds.  Neurological:     General: No focal deficit present.     Mental Status: He is alert and oriented to person, place, and time.  Psychiatric:        Mood and Affect: Mood normal.        Behavior: Behavior normal.     ED Results / Procedures / Treatments   Labs (all labs ordered are listed, but only abnormal results are displayed) Labs Reviewed  CBC WITH DIFFERENTIAL/PLATELET - Abnormal; Notable for the following components:      Result Value   RBC 2.52 (*)    Hemoglobin 7.6 (*)    HCT 22.9 (*)    Neutro Abs 8.1 (*)    All other components within normal limits  COMPREHENSIVE METABOLIC PANEL - Abnormal; Notable for the following components:   Chloride 92 (*)    Glucose, Bld 125 (*)    Creatinine, Ser 2.84 (*)    Calcium 8.4 (*)    Albumin 3.3 (*)    GFR, Estimated 21 (*)    All other components within normal limits  BRAIN NATRIURETIC PEPTIDE - Abnormal; Notable for the following components:   B Natriuretic Peptide 268.2 (*)    All other components within normal limits  TROPONIN I  (HIGH SENSITIVITY) - Abnormal; Notable for the following components:   Troponin I (High Sensitivity) 25 (*)    All other components within normal limits  RESP PANEL BY RT-PCR (FLU A&B, COVID) [id number]  TROPONIN I (HIGH SENSITIVITY)    EKG EKG Interpretation  Date/Time:  Thursday Jun 04 2020 16:08:54 EDT Ventricular Rate:  106 PR Interval:  186 QRS Duration: 106 QT Interval:  334 QTC Calculation: 444 R Axis:   -31 Text Interpretation: Sinus tachycardia Left axis deviation Anteroseptal infarct, age indeterminate Lateral leads are also involved TWI new since previous Confirmed by Wandra Arthurs ([id number]) on 06/04/2020 4:14:07 PM   Radiology DG Chest Port 1 View  Result Date: 06/04/2020 CLINICAL DATA:  Chest pain EXAM: PORTABLE CHEST 1 VIEW COMPARISON:  Chest radiograph dated 03/17/2019 and chest CT dated 03/25/2020. FINDINGS: The heart is enlarged. Vascular calcifications are seen in the aortic arch. A cardiac loop recorder overlies the left chest. A moderate right pleural effusion with associated atelectasis/airspace disease is noted. There is mild left basilar atelectasis. There is no significant left pleural effusion. There is no pneumothorax on either side. Degenerative changes are seen in the spine. IMPRESSION: Moderate right pleural effusion with associated atelectasis/airspace disease. Cardiomegaly. Aortic Atherosclerosis (ICD10-I70.0). Electronically Signed   By: Zerita Boers M.D.   On: 06/04/2020 16:36    Procedures Procedures   CRITICAL CARE Performed by: Wandra Arthurs   Total critical care time: 30  minutes  Critical care time was exclusive of separately billable procedures and treating other patients.  Critical care was necessary to treat or prevent imminent or life-threatening deterioration.  Critical care was time spent personally by me on the following activities: development of treatment plan with patient and/or surrogate as well as nursing, discussions with consultants,  evaluation of patient's response to treatment, examination of patient, obtaining history from patient or surrogate, ordering and performing treatments and  interventions, ordering and review of laboratory studies, ordering and review of radiographic studies, [name] oximetry and re-evaluation of patient's condition.  Medications Ordered in ED Medications  nitroGLYCERIN (NITROSTAT) SL tablet 0.4 mg (has no administration in time range)  aspirin tablet 325 mg (325 mg Oral Given 06/04/20 1703)    ED Course  I have reviewed the triage vital signs and the nursing notes.  Pertinent labs & imaging results that were available during my care of the patient were reviewed by me and considered in my medical decision making (see chart for details).    MDM Rules/Calculators/A&P                         Nicholas [name] is a 87 y.o. male who presented with chest pain.  Patient still has 5 out of 10 chest pain.  Patient does have history of CAD.  He also has EKG changes.  He took 3 nitro already.  At this point I have high suspicion for unstable angina or ACS.  Plan to start nitro drip and if his troponin is elevated will need heparin drip as well.  Will consult cardiology after troponin returns  9:00 PM Initial trop is 15. Repeat is 25.  Patient's pain came back to 2 out of 10.  Patient does have some new EKG changes.  Discussed case with cardiology, Dr. Ailene Ravel.  He recommends heparin drip.  Patient's pain resolved with nitro sublingual.  Consulted hospitalist to admit for NSTEMI  Final Clinical Impression(s) / ED Diagnoses Final diagnoses:  None    Rx / DC Orders ED Discharge Orders    None       Drenda Freeze, MD 06/04/20 2135

## 2020-06-05 ENCOUNTER — Encounter (HOSPITAL_COMMUNITY): Payer: Self-pay | Admitting: Family Medicine

## 2020-06-05 ENCOUNTER — Encounter (HOSPITAL_COMMUNITY)
Admission: EM | Disposition: A | Discharge status: Home / Self Care | Payer: Self-pay | Source: Home / Self Care | Attending: Internal Medicine

## 2020-06-05 ENCOUNTER — Encounter: Payer: Self-pay | Admitting: Cardiovascular Disease

## 2020-06-05 ENCOUNTER — Encounter: Payer: Medicare Other | Admitting: Cardiovascular Disease

## 2020-06-05 DIAGNOSIS — I700 Atherosclerosis of aorta: Secondary | ICD-10-CM | POA: Diagnosis not present

## 2020-06-05 DIAGNOSIS — Z833 Family history of diabetes mellitus: Secondary | ICD-10-CM | POA: Diagnosis not present

## 2020-06-05 DIAGNOSIS — J9601 Acute respiratory failure with hypoxia: Secondary | ICD-10-CM | POA: Diagnosis not present

## 2020-06-05 DIAGNOSIS — K219 Gastro-esophageal reflux disease without esophagitis: Secondary | ICD-10-CM | POA: Diagnosis present

## 2020-06-05 DIAGNOSIS — I214 Non-ST elevation (NSTEMI) myocardial infarction: Secondary | ICD-10-CM | POA: Diagnosis not present

## 2020-06-05 DIAGNOSIS — I25118 Atherosclerotic heart disease of native coronary artery with other forms of angina pectoris: Secondary | ICD-10-CM | POA: Diagnosis not present

## 2020-06-05 DIAGNOSIS — N186 End stage renal disease: Secondary | ICD-10-CM | POA: Diagnosis not present

## 2020-06-05 DIAGNOSIS — D638 Anemia in other chronic diseases classified elsewhere: Secondary | ICD-10-CM | POA: Diagnosis not present

## 2020-06-05 DIAGNOSIS — R079 Chest pain, unspecified: Secondary | ICD-10-CM | POA: Diagnosis present

## 2020-06-05 DIAGNOSIS — Z992 Dependence on renal dialysis: Secondary | ICD-10-CM | POA: Diagnosis not present

## 2020-06-05 DIAGNOSIS — Z823 Family history of stroke: Secondary | ICD-10-CM | POA: Diagnosis not present

## 2020-06-05 DIAGNOSIS — D649 Anemia, unspecified: Secondary | ICD-10-CM | POA: Diagnosis not present

## 2020-06-05 DIAGNOSIS — E0800 Diabetes mellitus due to underlying condition with hyperosmolarity without nonketotic hyperglycemic-hyperosmolar coma (NKHHC): Secondary | ICD-10-CM | POA: Diagnosis not present

## 2020-06-05 DIAGNOSIS — M160 Bilateral primary osteoarthritis of hip: Secondary | ICD-10-CM | POA: Diagnosis not present

## 2020-06-05 DIAGNOSIS — E8889 Other specified metabolic disorders: Secondary | ICD-10-CM | POA: Diagnosis present

## 2020-06-05 DIAGNOSIS — Y831 Surgical operation with implant of artificial internal device as the cause of abnormal reaction of the patient, or of later complication, without mention of misadventure at the time of the procedure: Secondary | ICD-10-CM | POA: Diagnosis present

## 2020-06-05 DIAGNOSIS — I252 Old myocardial infarction: Secondary | ICD-10-CM | POA: Diagnosis not present

## 2020-06-05 DIAGNOSIS — J90 Pleural effusion, not elsewhere classified: Secondary | ICD-10-CM | POA: Diagnosis not present

## 2020-06-05 DIAGNOSIS — E7800 Pure hypercholesterolemia, unspecified: Secondary | ICD-10-CM | POA: Diagnosis present

## 2020-06-05 DIAGNOSIS — Z794 Long term (current) use of insulin: Secondary | ICD-10-CM | POA: Diagnosis not present

## 2020-06-05 DIAGNOSIS — I2510 Atherosclerotic heart disease of native coronary artery without angina pectoris: Secondary | ICD-10-CM | POA: Diagnosis not present

## 2020-06-05 DIAGNOSIS — I132 Hypertensive heart and chronic kidney disease with heart failure and with stage 5 chronic kidney disease, or end stage renal disease: Secondary | ICD-10-CM | POA: Diagnosis not present

## 2020-06-05 DIAGNOSIS — T82855A Stenosis of coronary artery stent, initial encounter: Secondary | ICD-10-CM | POA: Diagnosis not present

## 2020-06-05 DIAGNOSIS — Z7902 Long term (current) use of antithrombotics/antiplatelets: Secondary | ICD-10-CM | POA: Diagnosis not present

## 2020-06-05 DIAGNOSIS — I5022 Chronic systolic (congestive) heart failure: Secondary | ICD-10-CM | POA: Diagnosis not present

## 2020-06-05 DIAGNOSIS — N250 Renal osteodystrophy: Secondary | ICD-10-CM | POA: Diagnosis not present

## 2020-06-05 DIAGNOSIS — E1122 Type 2 diabetes mellitus with diabetic chronic kidney disease: Secondary | ICD-10-CM | POA: Diagnosis present

## 2020-06-05 DIAGNOSIS — I1311 Hypertensive heart and chronic kidney disease without heart failure, with stage 5 chronic kidney disease, or end stage renal disease: Secondary | ICD-10-CM | POA: Diagnosis not present

## 2020-06-05 DIAGNOSIS — L409 Psoriasis, unspecified: Secondary | ICD-10-CM | POA: Diagnosis present

## 2020-06-05 DIAGNOSIS — N281 Cyst of kidney, acquired: Secondary | ICD-10-CM | POA: Diagnosis not present

## 2020-06-05 DIAGNOSIS — D631 Anemia in chronic kidney disease: Secondary | ICD-10-CM | POA: Diagnosis not present

## 2020-06-05 DIAGNOSIS — N2889 Other specified disorders of kidney and ureter: Secondary | ICD-10-CM | POA: Diagnosis present

## 2020-06-05 DIAGNOSIS — I25110 Atherosclerotic heart disease of native coronary artery with unstable angina pectoris: Secondary | ICD-10-CM | POA: Diagnosis not present

## 2020-06-05 DIAGNOSIS — Z87891 Personal history of nicotine dependence: Secondary | ICD-10-CM | POA: Diagnosis not present

## 2020-06-05 DIAGNOSIS — Z20822 Contact with and (suspected) exposure to covid-19: Secondary | ICD-10-CM | POA: Diagnosis not present

## 2020-06-05 DIAGNOSIS — E785 Hyperlipidemia, unspecified: Secondary | ICD-10-CM | POA: Diagnosis not present

## 2020-06-05 HISTORY — PX: LEFT HEART CATH AND CORONARY ANGIOGRAPHY: CATH118249

## 2020-06-05 LAB — LIPID PANEL
Cholesterol: 198 mg/dL (ref 0–200)
HDL: 62 mg/dL (ref >40)
LDL Cholesterol: 122 mg/dL — ABNORMAL HIGH (ref 0–99)
Total CHOL/HDL Ratio: 3.2 RATIO
Triglycerides: 71 mg/dL (ref <150)
VLDL: 14 mg/dL (ref 0–40)

## 2020-06-05 LAB — CBC
HCT: 23.7 % — ABNORMAL LOW (ref 39.0–52.0)
Hemoglobin: 7.7 g/dL — ABNORMAL LOW (ref 13.0–17.0)
MCH: 30 pg (ref 26.0–34.0)
MCHC: 32.5 g/dL (ref 30.0–36.0)
MCV: 92.2 fL (ref 80.0–100.0)
Platelets: 308 10*3/uL (ref 150–400)
RBC: 2.57 MIL/uL — ABNORMAL LOW (ref 4.22–5.81)
RDW: 14.7 % (ref 11.5–15.5)
WBC: 8.2 10*3/uL (ref 4.0–10.5)
nRBC: 0 % (ref 0.0–0.2)

## 2020-06-05 LAB — HEMOGLOBIN A1C
Hgb A1c MFr Bld: 7.9 % — ABNORMAL HIGH (ref 4.8–5.6)
Mean Plasma Glucose: 180.03 mg/dL

## 2020-06-05 LAB — GLUCOSE, CAPILLARY
Glucose-Capillary: 102 mg/dL — ABNORMAL HIGH (ref 70–99)
Glucose-Capillary: 119 mg/dL — ABNORMAL HIGH (ref 70–99)
Glucose-Capillary: 155 mg/dL — ABNORMAL HIGH (ref 70–99)
Glucose-Capillary: 199 mg/dL — ABNORMAL HIGH (ref 70–99)
Glucose-Capillary: 207 mg/dL — ABNORMAL HIGH (ref 70–99)

## 2020-06-05 LAB — TROPONIN I (HIGH SENSITIVITY): Troponin I (High Sensitivity): 40 ng/L — ABNORMAL HIGH (ref <18)

## 2020-06-05 LAB — CREATININE, SERUM
Creatinine, Ser: 3.88 mg/dL — ABNORMAL HIGH (ref 0.61–1.24)
GFR, Estimated: 14 mL/min — ABNORMAL LOW (ref >60)

## 2020-06-05 LAB — HEPARIN LEVEL (UNFRACTIONATED): Heparin Unfractionated: 0.64 IU/mL (ref 0.30–0.70)

## 2020-06-05 LAB — POCT ACTIVATED CLOTTING TIME: Activated Clotting Time: 148 seconds

## 2020-06-05 SURGERY — LEFT HEART CATH AND CORONARY ANGIOGRAPHY
Anesthesia: LOCAL

## 2020-06-05 MED ORDER — MIDAZOLAM HCL 2 MG/2ML IJ SOLN
INTRAMUSCULAR | Status: AC
  Filled 2020-06-05: qty 2

## 2020-06-05 MED ORDER — SODIUM CHLORIDE 0.9% FLUSH: 3.0000 mL | INTRAVENOUS | Status: DC | PRN

## 2020-06-05 MED ORDER — LIDOCAINE HCL (PF) 1 % IJ SOLN
INTRAMUSCULAR | Status: DC | PRN
  Administered 2020-06-05: 14 mL

## 2020-06-05 MED ORDER — ASPIRIN 81 MG PO CHEW: 81.0000 mg | CHEWABLE_TABLET | ORAL | Status: DC

## 2020-06-05 MED ORDER — ONDANSETRON HCL 4 MG/2ML IJ SOLN: 4.0000 mg | Freq: Four times a day (QID) | INTRAMUSCULAR | Status: DC | PRN

## 2020-06-05 MED ORDER — CHLORHEXIDINE GLUCONATE CLOTH 2 % EX PADS: 6.0000 | MEDICATED_PAD | Freq: Every day | CUTANEOUS | Status: DC

## 2020-06-05 MED ORDER — NITROGLYCERIN 0.4 MG SL SUBL: 0.4000 mg | SUBLINGUAL_TABLET | SUBLINGUAL | Status: DC | PRN

## 2020-06-05 MED ORDER — IOHEXOL 350 MG/ML SOLN
INTRAVENOUS | Status: DC | PRN
  Administered 2020-06-05: 95 mL

## 2020-06-05 MED ORDER — ISOSORBIDE DINITRATE 20 MG PO TABS
20.0000 mg | ORAL_TABLET | Freq: Every day | ORAL | Status: DC
  Administered 2020-06-05: 20 mg via ORAL
  Filled 2020-06-05 (×2): qty 1

## 2020-06-05 MED ORDER — RENA-VITE PO TABS
1.0000 | ORAL_TABLET | Freq: Every day | ORAL | Status: DC
  Administered 2020-06-06 – 2020-06-07 (×2): 1 via ORAL
  Filled 2020-06-05 (×3): qty 1

## 2020-06-05 MED ORDER — HEPARIN (PORCINE) IN NACL 1000-0.9 UT/500ML-% IV SOLN
INTRAVENOUS | Status: AC
  Filled 2020-06-05: qty 1000

## 2020-06-05 MED ORDER — LIDOCAINE VISCOUS HCL 2 % MT SOLN
15.0000 mL | Freq: Once | OROMUCOSAL | Status: AC
  Administered 2020-06-05: 15 mL via ORAL
  Filled 2020-06-05 (×2): qty 15

## 2020-06-05 MED ORDER — SODIUM CHLORIDE 0.9 % IV SOLN: INTRAVENOUS | Status: DC

## 2020-06-05 MED ORDER — FENTANYL CITRATE (PF) 100 MCG/2ML IJ SOLN
INTRAMUSCULAR | Status: DC | PRN
  Administered 2020-06-05 (×2): 25 ug via INTRAVENOUS

## 2020-06-05 MED ORDER — HEPARIN (PORCINE) IN NACL 1000-0.9 UT/500ML-% IV SOLN
INTRAVENOUS | Status: DC | PRN
  Administered 2020-06-05 (×2): 500 mL

## 2020-06-05 MED ORDER — FENTANYL CITRATE (PF) 100 MCG/2ML IJ SOLN
INTRAMUSCULAR | Status: AC
  Filled 2020-06-05: qty 2

## 2020-06-05 MED ORDER — CINACALCET HCL 30 MG PO TABS
60.0000 mg | ORAL_TABLET | ORAL | Status: DC
  Filled 2020-06-05: qty 2

## 2020-06-05 MED ORDER — SODIUM CHLORIDE 0.9% FLUSH
3.0000 mL | Freq: Two times a day (BID) | INTRAVENOUS | Status: DC
Start: 2020-06-05 — End: 2020-06-08
  Administered 2020-06-05 – 2020-06-08 (×6): 3 mL via INTRAVENOUS

## 2020-06-05 MED ORDER — ASPIRIN 81 MG PO CHEW
81.0000 mg | CHEWABLE_TABLET | ORAL | Status: AC
  Administered 2020-06-05: 81 mg via ORAL
  Filled 2020-06-05: qty 1

## 2020-06-05 MED ORDER — SEVELAMER CARBONATE 800 MG PO TABS
1600.0000 mg | ORAL_TABLET | Freq: Three times a day (TID) | ORAL | Status: DC
  Administered 2020-06-05 – 2020-06-08 (×6): 1600 mg via ORAL
  Filled 2020-06-05 (×7): qty 2

## 2020-06-05 MED ORDER — MIDAZOLAM HCL 2 MG/2ML IJ SOLN
INTRAMUSCULAR | Status: DC | PRN
  Administered 2020-06-05 (×2): 0.5 mg via INTRAVENOUS

## 2020-06-05 MED ORDER — SODIUM CHLORIDE 0.9% FLUSH
3.0000 mL | Freq: Two times a day (BID) | INTRAVENOUS | Status: DC
  Administered 2020-06-05 – 2020-06-08 (×5): 3 mL via INTRAVENOUS

## 2020-06-05 MED ORDER — OXYCODONE HCL 5 MG PO TABS: 5.0000 mg | ORAL_TABLET | ORAL | Status: DC | PRN

## 2020-06-05 MED ORDER — CLOPIDOGREL BISULFATE 75 MG PO TABS
75.0000 mg | ORAL_TABLET | Freq: Every day | ORAL | Status: DC
  Administered 2020-06-06 – 2020-06-08 (×3): 75 mg via ORAL
  Filled 2020-06-05 (×3): qty 1

## 2020-06-05 MED ORDER — SODIUM CHLORIDE 0.9 % IV SOLN: 250.0000 mL | INTRAVENOUS | Status: DC | PRN

## 2020-06-05 MED ORDER — ACETAMINOPHEN 325 MG PO TABS: 650.0000 mg | ORAL_TABLET | ORAL | Status: DC | PRN

## 2020-06-05 MED ORDER — NEPRO/CARBSTEADY PO LIQD: 237.0000 mL | Freq: Two times a day (BID) | ORAL | Status: DC

## 2020-06-05 MED ORDER — CINACALCET HCL 30 MG PO TABS
30.0000 mg | ORAL_TABLET | ORAL | Status: DC
  Filled 2020-06-05 (×2): qty 1

## 2020-06-05 MED ORDER — INSULIN ASPART 100 UNIT/ML IJ SOLN
0.0000 [IU] | Freq: Three times a day (TID) | INTRAMUSCULAR | Status: DC
  Administered 2020-06-05: 2 [IU] via SUBCUTANEOUS
  Administered 2020-06-06 – 2020-06-07 (×3): 1 [IU] via SUBCUTANEOUS
  Administered 2020-06-08: 2 [IU] via SUBCUTANEOUS

## 2020-06-05 MED ORDER — LABETALOL HCL 5 MG/ML IV SOLN: 10.0000 mg | INTRAVENOUS | Status: AC | PRN

## 2020-06-05 MED ORDER — LIDOCAINE HCL (PF) 1 % IJ SOLN
INTRAMUSCULAR | Status: AC
  Filled 2020-06-05: qty 30

## 2020-06-05 MED ORDER — LIDOCAINE-PRILOCAINE 2.5-2.5 % EX CREA: 1.0000 "application " | TOPICAL_CREAM | CUTANEOUS | Status: DC

## 2020-06-05 MED ORDER — ATORVASTATIN CALCIUM 80 MG PO TABS
80.0000 mg | ORAL_TABLET | Freq: Every day | ORAL | Status: DC
  Administered 2020-06-05 – 2020-06-08 (×4): 80 mg via ORAL
  Filled 2020-06-05 (×4): qty 1

## 2020-06-05 MED ORDER — CINACALCET HCL 30 MG PO TABS: 30.0000 mg | ORAL_TABLET | ORAL | Status: DC

## 2020-06-05 MED ORDER — HYDRALAZINE HCL 20 MG/ML IJ SOLN: 10.0000 mg | INTRAMUSCULAR | Status: AC | PRN

## 2020-06-05 MED ORDER — INSULIN ASPART PROT & ASPART (70-30 MIX) 100 UNIT/ML ~~LOC~~ SUSP
20.0000 [IU] | Freq: Two times a day (BID) | SUBCUTANEOUS | Status: DC
  Administered 2020-06-05 – 2020-06-07 (×3): 20 [IU] via SUBCUTANEOUS
  Filled 2020-06-05: qty 10

## 2020-06-05 MED ORDER — HYDROCODONE-ACETAMINOPHEN 5-325 MG PO TABS
1.0000 | ORAL_TABLET | Freq: Three times a day (TID) | ORAL | Status: DC | PRN
Start: 2020-06-05 — End: 2020-06-08
  Administered 2020-06-06 – 2020-06-08 (×4): 1 via ORAL
  Filled 2020-06-05 (×4): qty 1

## 2020-06-05 MED ORDER — ALUM & MAG HYDROXIDE-SIMETH 200-200-20 MG/5ML PO SUSP
30.0000 mL | Freq: Once | ORAL | Status: AC
  Administered 2020-06-05: 30 mL via ORAL
  Filled 2020-06-05: qty 30

## 2020-06-05 MED ORDER — HEPARIN SODIUM (PORCINE) 5000 UNIT/ML IJ SOLN
5000.0000 [IU] | Freq: Three times a day (TID) | INTRAMUSCULAR | Status: DC
  Administered 2020-06-05: 5000 [IU] via SUBCUTANEOUS
  Filled 2020-06-05: qty 1

## 2020-06-05 SURGICAL SUPPLY — 9 items
CATH INFINITI 5FR JL4 (CATHETERS) ×2
CATH INFINITI 5FR MPB2 (CATHETERS) ×2
KIT HEART LEFT (KITS) ×2
PACK CARDIAC CATHETERIZATION (CUSTOM PROCEDURE TRAY) ×2
SHEATH PINNACLE 5F 10CM (SHEATH) ×2
SHEATH PROBE COVER 6X72 (BAG) ×2
TRANSDUCER W/STOPCOCK (MISCELLANEOUS) ×2
TUBING CIL FLEX 10 FLL-RA (TUBING) ×2
WIRE EMERALD 3MM-J .035X150CM (WIRE) ×2

## 2020-06-05 NOTE — Progress Notes (Signed)
[facility] Summary Report / Loop Recorder 

## 2020-06-05 NOTE — Progress Notes (Signed)
Renal Navigator received update from Dr. Schertz/Nephrologist that patient will most likely be discharged tomorrow, which is patient's normal HD day. Navigator spoke with patient (with patient's permission to speak openly) at bedside to introduce self/role. Patient reports that he would prefer to have his HD treatment at his outpatient clinic/[facility] and states his seat time is 11:30am (11:15am arrival). His wife can transport him if he is discharged early enough. He would greatly appreciate it if we are able to get him discharged tomorrow morning with with enough time for him to go to his outpatient HD treatment.  Navigator updated Dr. Jonnie Finner, who states he will round on patient first in the morning and speak with patient's Attending. Patient and wife were very appreciative of the visit.  Navigator contacted patient's HD clinic to inform them that patient should be there for tx tomorrow and she states she had just gotten off the phone with patient also to tell her the same thing. Navigator has asked Renal PA/Courtney to fax D/C Summary and Renal Note to clinic [phone]) tomorrow since Navigator will not be here on a Saturday.   [name], [place] Renal Navigator [phone]

## 2020-06-05 NOTE — Progress Notes (Signed)
Initial Nutrition Assessment  DOCUMENTATION CODES:   Not applicable  INTERVENTION:   -Liberalize diet to carb modified -Renal MVI daily -Nepro Shake po BID, each supplement provides 425 kcal and 19 grams protein  NUTRITION DIAGNOSIS:   Increased nutrient needs related to chronic illness (ESRD on HD) as evidenced by estimated needs.  GOAL:   Patient will meet greater than or equal to 90% of their needs  MONITOR:   PO intake,Supplement acceptance,Labs,Weight trends,Skin,I & O's  REASON FOR ASSESSMENT:   Malnutrition Screening Tool    ASSESSMENT:   N[name] is a 87 y.o. male with medical history significant of  ESRD (HD TTS), THN, HLD, CHF   Anemia of chronic disease, history of CAD, s/p PCI 08/13/2018, with stents x2, on Plavix,, presented to ED with chief complaint of chest pain started 06/04/2020, took 3 nitroglycerin before arriving to ED, with still having substernal chest pain with some radiation.  Currently chest pain-free  Pt admitted with NSTEMI.   5/13- s/p cath  Reviewed I/O's: +29 ml x 24 hours  Spoke with pt at bedside, who was pleasant and in good spirits today. He reports a fair appetite at baseline. He explains that he underwent a nephrectomy back in 2018 and was told that he would lose his appetite after surgery. Pt reports he has a poor appetite after surgery, but it took about 6 months to improve. Since then, pt reports he continues to experience early satiety ("I think my stomach shrunk because of that").   Per pt, he consumes 2 meals on both HD and non-HD days, however, intake is usually better on non-HD days. Breakfast is usually eggs, fruit, and cereal and Dinner is a meat, starch, and vegetable. Pt also consumes a Nepro shake daily.   Reviewed wt hx; pt has experienced a 4.7% wt loss over the past year, which is not significant for time frame. Per pt, his dry weight is 73 kg, but was 72 kg after last HD.   Per discussion with RN, pt was upset that  he was unable to order items such as mac and cheese and mashed potatoes. RD explained rationale for renal diet. Per pt, his K levels are usually low and he has been told to increase K in diet. Discussed with MD; liberalized diet to carb modified.   Discussed importance of good meal and supplement intake to promote healing.   Medications reviewed and include renvela.  Lab Results  Component Value Date   HGBA1C 7.9 (H) 06/05/2020   PTA DM medications are 20 units insulin NPH-regular BID.   Labs reviewed: CBGS: 102-207 (inpatient orders for glycemic control are 0-6 units insulin aspart TID with meals).   NUTRITION - FOCUSED PHYSICAL EXAM:  Flowsheet Row Most Recent Value  Orbital Region No depletion  Upper Arm Region No depletion  Thoracic and Lumbar Region No depletion  Buccal Region No depletion  Temple Region No depletion  Clavicle Bone Region No depletion  Clavicle and Acromion Bone Region No depletion  Scapular Bone Region No depletion  Dorsal Hand No depletion  Patellar Region No depletion  Anterior Thigh Region No depletion  Posterior Calf Region No depletion  Edema (RD Assessment) None  Hair Reviewed  Eyes Reviewed  Mouth Reviewed  Skin Reviewed  Nails Reviewed       Diet Order:   Diet Order            Diet Carb Modified Fluid consistency: Thin; Room service appropriate? Yes  Diet effective now  EDUCATION NEEDS:   Education needs have been addressed  Skin:  Skin Assessment: Reviewed RN Assessment  Last BM:  06/04/20  Height:   Ht Readings from Last 1 Encounters:  06/04/20 _0  (1.727 m)    Weight:   Wt Readings from Last 1 Encounters:  06/05/20 72.4 kg    Ideal Body Weight:  70 kg  BMI:  Body mass index is 24.27 kg/m.  Estimated Nutritional Needs:   Kcal:  1850-2050  Protein:  90-105 grams  Fluid:  1000 ml + UOP  Loistine Chance, RD, LDN, [place] Registered Dietitian II Certified Diabetes Care and Education  Specialist Please refer to [facility] for RD and/or RD on-call/weekend/after hours pager

## 2020-06-05 NOTE — Interval H&P Note (Signed)
Cath Lab Visit (complete for each Cath Lab visit)  Clinical Evaluation Leading to the Procedure:   ACS: Yes.    Non-ACS:    Anginal Classification: CCS III  Anti-ischemic medical therapy: Minimal Therapy (1 class of medications)  Non-Invasive Test Results: No non-invasive testing performed  Prior CABG: No previous CABG      History and Physical Interval Note:  06/05/2020 10:35 AM  [name]  has presented today for surgery, with the diagnosis of chest pain.  The various methods of treatment have been discussed with the patient and family. After consideration of risks, benefits and other options for treatment, the patient has consented to  Procedure(s): LEFT HEART CATH AND CORONARY ANGIOGRAPHY (N/A) as a surgical intervention.  The patient's history has been reviewed, patient examined, no change in status, stable for surgery.  I have reviewed the patient's chart and labs.  Questions were answered to the patient's satisfaction.     Belva Crome III

## 2020-06-05 NOTE — Consult Note (Signed)
Renal Service Consult Note [facility]  Nicholas [name] 06/05/2020 [name], MD Requesting Physician: Dr. Roger Shelter  Reason for Consult: ESRD pt w/ chest pain, [place] HPI: The patient is a 87 y.o. year-old w/ hx of HTN, Hl, anemia ckd, DM2, CAD sp 2 stents presented to ED w/ chest pain on 06/04/20. Took sl ntg at home x 3, still having CP in ED. Per cardiology was started on asa po and IV heparin, and he underwent a LHC cath this am by Dr Linard Millers. Findings were unchanged from left cath and there was no new intervention. Prior LAD stent has only 30% ISR, there is still 50% distal L main occlusion. RCA 100% occluded as prior. LVEDP was 10. Pt returned to his room. We are asked to see for dialysis.   Pt on dialysis for about 2 yrs now. Likes his dialysis in [place]. The building is new and impressive.  No c/o SOB, cough, orthopnea, no abd pain, no n/v/d today.    ROS  denies CP  no joint pain   no HA  no blurry vision  no rash  no diarrhea  no nausea/ vomiting   Past Medical History  Past Medical History:  Diagnosis Date  . Arthritis   . Chronic kidney disease   . Diabetes mellitus ([place])   . Dialysis patient ([place])   . GERD (gastroesophageal reflux disease)    occ  . Heart murmur   . Hypercholesteremia   . Hypertension   . Psoriasis    Past Surgical History  Past Surgical History:  Procedure Laterality Date  . CARDIAC CATHETERIZATION  09   "some blockage" with collaterals by cath at [facility] ~ 2009; no intervention required; reportedly, no routine cardiology f/u recommended   . CORONARY ATHERECTOMY N/A 08/09/2018   Procedure: CORONARY ATHERECTOMY;  Surgeon: Jettie Booze, MD;  Location: [place] [facility];  Service: Cardiovascular;  Laterality: N/A;  . CORONARY STENT INTERVENTION N/A 08/09/2018   Procedure: CORONARY STENT INTERVENTION;  Surgeon: Jettie Booze, MD;  Location: [place] [facility];  Service: Cardiovascular;  Laterality: N/A;  .  INGUINAL HERNIA REPAIR Right 04/13/2012   Procedure: HERNIA REPAIR INGUINAL INCARCERATED;  Surgeon: Gayland Curry, MD;  Location: [place];  Service: General;  Laterality: Right;  . INSERTION OF MESH Right 04/13/2012   Procedure: INSERTION OF MESH;  Surgeon: Gayland Curry, MD;  Location: [place];  Service: General;  Laterality: Right;  . LEFT HEART CATH N/A 08/09/2018   Procedure: Left Heart Cath;  Surgeon: Jettie Booze, MD;  Location: [place] [facility];  Service: Cardiovascular;  Laterality: N/A;  . LEFT HEART CATH AND CORONARY ANGIOGRAPHY N/A 08/08/2018   Procedure: LEFT HEART CATH AND CORONARY ANGIOGRAPHY;  Surgeon: Troy Sine, MD;  Location: [place] [facility];  Service: Cardiovascular;  Laterality: N/A;  . VASECTOMY     Family History  Family History  Problem Relation Age of Onset  . Diabetes Mother   . Stroke Father   . Diabetes Sister   . Diabetes Brother    Social History  reports that he quit smoking about 33 years ago. His smoking use included cigarettes. He has a 15.00 pack-year smoking history. He has never used smokeless tobacco. He reports that he does not drink alcohol and does not use drugs. Allergies  Allergies  Allergen Reactions  . Hydrochlorothiazide Other (See Comments)    [facility]   Home medications Prior to Admission medications  Medication Sig Start Date End Date Taking? Authorizing Provider  clopidogrel (PLAVIX) 75 MG tablet Take 1 tablet (75 mg total) by mouth daily with breakfast. 08/11/18  Yes [name], MD  atorvastatin (LIPITOR) 80 MG tablet Take 80 mg by mouth daily.    [provider]  cinacalcet (SENSIPAR) 30 MG tablet Take 30-60 mg by mouth See admin instructions. Take 2 tablets (60 mg) by mouth on Tuesday, Thursday, Saturday (dialysis days), take 1 tablet (30 mg) on Sunday, Monday, Wednesday, Friday (non-dialysis days)    [provider]  HYDROcodone-acetaminophen (NORCO/VICODIN) 5-325 MG tablet Take 1  tablet by mouth every 8 (eight) hours as needed for moderate pain or severe pain. 04/28/20   Charlesetta Shanks, MD  HYDROcodone-acetaminophen (NORCO/VICODIN) 5-325 MG tablet Take 1 tablet by mouth every 6 (six) hours as needed for moderate pain or severe pain. Take 1 tablet in the morning and 1 tablet in the evening for pain control 04/28/20   Charlesetta Shanks, MD  insulin NPH-regular Human (70-30) 100 UNIT/ML injection Inject 20 Units into the skin See admin instructions. Inject 20 units subcutaneously in the morning as needed for CBG >140, inject 20 units every evening    [provider]  isosorbide dinitrate (ISORDIL) 20 MG tablet Take 1 tablet (20 mg total) by mouth at bedtime. 05/15/19   Deboraha Sprang, MD  lidocaine-prilocaine (EMLA) cream Apply 1 application topically See admin instructions. Apply topically 30 minutes prior to dialysis on Tuesday, Thursday, Saturday 11/15/18   [provider]  nitroGLYCERIN (NITROSTAT) 0.4 MG SL tablet Place 1 tablet (0.4 mg total) under the tongue every 5 (five) minutes x 3 doses as needed for chest pain. 08/10/18   [name], MD  Nutritional Supplements (NEPRO/CARBSTEADY PO) Take 237 mLs by mouth daily. vanilla    [provider]  sevelamer carbonate (RENVELA) 800 MG tablet Take 1,600 mg by mouth 3 (three) times daily. 05/22/19   [provider]     Vitals:   06/05/20 1210 06/05/20 1215 06/05/20 1241 06/05/20 1625  BP: (!) 82/19 (!) 100/42 (!) 119/57 (!) 119/58  Pulse: 77  79 83  Resp: 18 16 16 16   Temp:   98 F (36.7 C) 98 F (36.7 C)  TempSrc:   Oral Oral  SpO2: 100%  95% 100%  Weight:      Height:       Exam Gen alert, no distress No rash, cyanosis or gangrene Sclera anicteric, throat clear  No jvd or bruits Chest clear bilat to bases, no rales/ wheezing RRR no MRG Abd soft ntnd no mass or ascites +bs GU normal MS no joint effusions or deformity Ext no LE or UE edema, no wounds or ulcers Neuro is  alert, Ox 3 , nf   LUE AVF+bruit       Home meds:  - plavix 75 qd/ lipitor 80 qd/ isordil 20 hs/ sl ntg prn  - renvela 800 take 2 ac tid/ sensipar take 30mg  on nonhd days and 60mg  on hd days  - norco qid prn pain  - insulin 70/30 20u qhs  - prn's/ vitamins/ supplements       CXR 5/12 - IMPRESSION: Moderate right pleural effusion with associated atelectasis/airspace disease. Cardiomegaly.    OP HD: [facility]  - details pending   Assessment/ Plan: 1. CAD/ chest pain / unstable angina / NSTEMI - w/ extensive hx of CAD, sp PCI 07/2018. SP LHC this am, no new intervention possible. Significant CAD. Recommending  medical Rx.  2. ESRD - on HD TTS.  Next HD is due tomorrow. His chair time at OP HD is around 11 am.  He would like to go to his OP HD tomorrow if it is safe and everyone is in agreement. Will reassess in am.  3. Anemia ckd - Hb here low in 7- 8 range. Transfuse prn.  4. MBD ckd - get records, cont vdra/ binder 5. HTN/ vol - no vol excess on exam.    Kelly Splinter  MD 06/05/2020, 4:40 PM  Recent Labs  Lab 06/04/20 1650 06/05/20 0001  WBC 10.0 8.2  HGB 7.6* 7.7*   Recent Labs  Lab 06/04/20 1650 06/05/20 0740  K 3.6  --   BUN 10  --   CREATININE 2.84* 3.88*  CALCIUM 8.4*  --

## 2020-06-05 NOTE — Progress Notes (Addendum)
Progress Note  Patient Name: Nicholas [name] Date of Encounter: 06/05/2020  Primary Cardiologist: Dr. Daneen Schick, MD  Subjective   Chest pain free this AM. Plan for cath today and regular HD tomorrow.   Inpatient Medications    Scheduled Meds:  alum & mag hydroxide-simeth  30 mL Oral Once   And   lidocaine  15 mL Oral Once   Continuous Infusions:  sodium chloride     heparin 950 Units/hr (06/04/20 2146)   PRN Meds: acetaminophen, nitroGLYCERIN, ondansetron (ZOFRAN) IV   Vital Signs    Vitals:   06/04/20 2356 06/05/20 0004 06/05/20 0005 06/05/20 0415  BP:   137/74 120/65  Pulse:    79  Resp:    16  Temp:  98 F (36.7 C)  97.7 F (36.5 C)  TempSrc:  Oral  Oral  SpO2:  99%  98%  Weight: 72.4 kg     Height: 5\' 8"  (1.727 m)       Intake/Output Summary (Last 24 hours) at 06/05/2020 0748 Last data filed at 06/05/2020 0300 Gross per 24 hour  Intake 28.5 ml  Output --  Net 28.5 ml   Filed Weights   06/04/20 1605 06/04/20 2356  Weight: 72 kg 72.4 kg    Physical Exam   PE per MD notation   Labs    Chemistry Recent Labs  Lab 06/04/20 1650  NA 135  K 3.6  CL 92*  CO2 28  GLUCOSE 125*  BUN 10  CREATININE 2.84*  CALCIUM 8.4*  PROT 7.7  ALBUMIN 3.3*  AST 15  ALT 15  ALKPHOS 79  BILITOT 0.5  GFRNONAA 21*  ANIONGAP 15     Hematology Recent Labs  Lab 06/04/20 1650 06/05/20 0001  WBC 10.0 8.2  RBC 2.52* 2.57*  HGB 7.6* 7.7*  HCT 22.9* 23.7*  MCV 90.9 92.2  MCH 30.2 30.0  MCHC 33.2 32.5  RDW 14.9 14.7  PLT 320 308    Cardiac EnzymesNo results for input(s): TROPONINI in the last 168 hours. No results for input(s): TROPIPOC in the last 168 hours.   BNP Recent Labs  Lab 06/04/20 1650  BNP 268.2*     DDimer No results for input(s): DDIMER in the last 168 hours.   Radiology    DG Chest Port 1 View  Result Date: 06/04/2020 CLINICAL DATA:  Chest pain EXAM: PORTABLE CHEST 1 VIEW COMPARISON:  Chest radiograph dated 03/17/2019 and  chest CT dated 03/25/2020. FINDINGS: The heart is enlarged. Vascular calcifications are seen in the aortic arch. A cardiac loop recorder overlies the left chest. A moderate right pleural effusion with associated atelectasis/airspace disease is noted. There is mild left basilar atelectasis. There is no significant left pleural effusion. There is no pneumothorax on either side. Degenerative changes are seen in the spine. IMPRESSION: Moderate right pleural effusion with associated atelectasis/airspace disease. Cardiomegaly. Aortic Atherosclerosis (ICD10-I70.0). Electronically Signed   By: Zerita Boers M.D.   On: 06/04/2020 16:36   Telemetry    06/05/20 NSR with rates in the 80's - Personally Reviewed  ECG    No new tracing as of 06/05/20- Personally Reviewed  Cardiac Studies   Echocardiogram 03/18/2019:   1. Left ventricular ejection fraction, by estimation, is 60 to 65%. The  left ventricle has normal function. The left ventricle has no regional  wall motion abnormalities. Left ventricular diastolic parameters are  consistent with Grade I diastolic  dysfunction (impaired relaxation). Elevated left ventricular end-diastolic  pressure.  2. Right ventricular systolic function is normal. The right ventricular  size is normal. There is normal pulmonary artery systolic pressure.   3. The mitral valve is normal in structure and function. No evidence of  mitral valve regurgitation. No evidence of mitral stenosis.   4. The aortic valve is tricuspid. Aortic valve regurgitation is mild.  Mild aortic valve sclerosis is present, with no evidence of aortic valve  stenosis.   5. The inferior vena cava is normal in size with greater than 50%  respiratory variability, suggesting right atrial pressure of 3 mmHg.    LHC 08/08/2018:  Prox LAD lesion is 90% stenosed. 1st Diag lesion is 80% stenosed. Mid LM to Dist LM lesion is 60% stenosed. 1st Mrg lesion is 100% stenosed. Ost RCA to Prox RCA lesion is  99% stenosed. Mid RCA to Dist RCA lesion is 100% stenosed. Prox Cx lesion is 80% stenosed.   Severe multivessel CAD with coronary and aortic calcification.  There is focal 60% distal left main stenosis; 90% proximal LAD stenosis at the origin of the first diagonal vessel with 80% ostial diagonal stenosis; 80% proximal circumflex stenosis with total occlusion of the circumflex marginal vessel with collateralization from the LAD to the marginal vessel; and total RCA occlusion with left to right collateralization to the PDA and PL branches.   LVEDP 19 mmHg.   RECOMMENDATION: Angiographic findings will be reviewed with Dr. Tamala Julian.  Consider surgical consultation if patient is a CABG candidate.   Diagnostic Dominance: Right    Stent intervention 08/09/2018:       Patient Profile     87 y.o. male with CAD s/p PCI (07/2018, pLAD), ESRD/iHD (T/R/Sa), HLD, and HTN who presented to [facility] ED with substernal chest pressure since 05/12 AM.   Assessment & Plan    1.  Chest pain with history of CAD s/p pLAD PCI 07/2018: -Patient presented with chest pain which began 06/04/2020 at which time he called for an appointment with Dr. Tamala Julian who recommended ED evaluation given his history.  Apparently, he has been having consistent exertional angina relieved with rest for the last year.  More recently, prior to HD on Thursday he began having acute onset of centralized nonradiating chest pain relieved with nitroglycerin with several subsequent episodes also relieved with nitroglycerin. -HST, 25>> 32>> 40 -Last intervention with NSTEMI 07/2018 treated pLAD with orbital arthrectomy and stenting -Last echocardiogram 03/18/2019 with LVEF at 60 to 65% with G1 DD and no evidence of valvular disease -On PTA Plavix, does not appear to be on ASA and high intensity atorvastatin -Start low dose beta block in the post cath setting  -Plan for repeat LHC given unstable anginal symptoms>>>post cath HD tomorrow per nephrology   -Cardiac catheterization was discussed with the patient fully. The patient understands that risks include but are not limited to stroke (1 in 1000), death (1 in 91), kidney failure [usually temporary] (1 in 500), bleeding (1 in 200), allergic reaction [possibly serious] (1 in 200).  The patient understands and is willing to proceed.    2.  ESRD: -HD TTHS -Follows with nephrology -Received full treatment Thursday -Creatinine, 2.84 -BNP on ED arrival 268>> fluid volume managed with HD -Will need post cath HD as scheduled tomorrow per nephrology   3.  HTN: -Stable, 120/65, 137/74, 123/71 -Not currently on any antihypertensives  4.  HLD: -Last LDL unable to be calculated with triglycerides greater than 200 and an HDL at 68 from 08/06/2018 -Unclear if patient is taking  high intensity atorvastatin -If so, will need lipid clinic referral for possible PCSK9 inhibitor  5.  DM2: -SSI for glucose control while inpatient -Per primary team   Signed, Kathyrn Drown NP-C [facility] Pager: [phone] 06/05/2020, 7:48 AM     Patient examined chart reviewed Discussed care with patient and NP. Increasing angina with resting symptoms now complicating his dialysis  Exam with black male in no distress Fistula LUE with thrill No murmur lungs clear trace edema KNown CAD with stent to LAD 06/04/20 occluded Mid RCA with collaterals and occluded OM. Discussed cath including risks of stroke, bleeding, has had contrast with no issues Not a CABG  Candidate with age and renal failure. ? RFA access given dialysis Had dialysis yesterday and volume status looks fine   Jenkins Rouge MD [facility]  For questions or updates, please contact   Please consult [email or web address] for contact info under Cardiology/STEMI.

## 2020-06-05 NOTE — Progress Notes (Signed)
[place] for heparin Indication: chest pain/ACS  Allergies  Allergen Reactions  . Hydrochlorothiazide Other (See Comments)    Unknown [facility]    Patient Measurements: Height: 5\' 8"  (172.7 cm) Weight: 72.4 kg (159 lb 11.2 oz) IBW/kg (Calculated) : 68.4 Heparin Dosing Weight: 72 kg   Vital Signs: Temp: 97.7 F (36.5 C) (05/13 0415) Temp Source: Oral (05/13 0415) BP: 120/65 (05/13 0415) Pulse Rate: 79 (05/13 0415)  Labs: Recent Labs    06/04/20 1650 06/04/20 2011 06/04/20 2210 06/05/20 0001 06/05/20 0640  HGB 7.6*  --   --  7.7*  --   HCT 22.9*  --   --  23.7*  --   PLT 320  --   --  308  --   HEPARINUNFRC  --   --   --   --  0.64  CREATININE 2.84*  --   --   --   --   TROPONINIHS 15 25* 32* 40*  --     Estimated Creatinine Clearance: 17.7 mL/min (A) (by C-G formula based on SCr of 2.84 mg/dL (H)).   Medical History: Past Medical History:  Diagnosis Date  . Arthritis   . Chronic kidney disease   . Diabetes mellitus ([place])   . Dialysis patient ([place])   . GERD (gastroesophageal reflux disease)    occ  . Heart murmur   . Hypercholesteremia   . Hypertension   . Psoriasis     Medications:  Medications Prior to Admission  Medication Sig Dispense Refill Last Dose  . atorvastatin (LIPITOR) 80 MG tablet Take 80 mg by mouth daily.     . cinacalcet (SENSIPAR) 30 MG tablet Take 30-60 mg by mouth See admin instructions. Take 2 tablets (60 mg) by mouth on Tuesday, Thursday, Saturday (dialysis days), take 1 tablet (30 mg) on Sunday, Monday, Wednesday, Friday (non-dialysis days)     . clopidogrel (PLAVIX) 75 MG tablet Take 1 tablet (75 mg total) by mouth daily with breakfast. 30 tablet 3   . HYDROcodone-acetaminophen (NORCO/VICODIN) 5-325 MG tablet Take 1 tablet by mouth every 8 (eight) hours as needed for moderate pain or severe pain. 20 tablet 0   . HYDROcodone-acetaminophen (NORCO/VICODIN) 5-325 MG tablet Take 1 tablet by  mouth every 6 (six) hours as needed for moderate pain or severe pain. Take 1 tablet in the morning and 1 tablet in the evening for pain control 20 tablet 0   . insulin NPH-regular Human (70-30) 100 UNIT/ML injection Inject 20 Units into the skin See admin instructions. Inject 20 units subcutaneously in the morning as needed for CBG >140, inject 20 units every evening     . isosorbide dinitrate (ISORDIL) 20 MG tablet Take 1 tablet (20 mg total) by mouth at bedtime. 30 tablet 11   . lidocaine-prilocaine (EMLA) cream Apply 1 application topically See admin instructions. Apply topically 30 minutes prior to dialysis on Tuesday, Thursday, Saturday     . nitroGLYCERIN (NITROSTAT) 0.4 MG SL tablet Place 1 tablet (0.4 mg total) under the tongue every 5 (five) minutes x 3 doses as needed for chest pain. 30 tablet 1   . Nutritional Supplements (NEPRO/CARBSTEADY PO) Take 237 mLs by mouth daily. vanilla     . sevelamer carbonate (RENVELA) 800 MG tablet Take 1,600 mg by mouth 3 (three) times daily.       Assessment: 45 YOM who presented with substernal chest pain and troponins trending up (history of CAD with stents). Pharmacy consulted  to dose IV heparin for ACS. Noted with ESRD on HD -heparin level at goal -hg= 7.7 (history of anemia)    Goal of Therapy:  Heparin level 0.3-0.7 units/ml Monitor platelets by anticoagulation protocol: Yes   Plan:  -Continue heparin  950 units/hr --recheck heparin level later today -Daily heparin level and CBC  Hildred Laser, PharmD Clinical Pharmacist **Pharmacist phone directory can now be found on [email or web address] ([email or web address]).  Listed under [place].

## 2020-06-05 NOTE — H&P (Signed)
History and Physical   Patient: Nicholas [name]                            PCP: [facility]                    DOB: 1930-03-23            DOA: 06/04/2020 MOQ:[id number]             DOS: 06/05/2020, 9:07 AM  Patient coming from:   HOME  I have personally reviewed patient's medical records, in electronic medical records, including:  [place] link, and care everywhere.    Chief Complaint:   Chief Complaint  Patient presents with  . Chest Pain    History of present illness:    Nicholas [name] is a 87 y.o. male with medical history significant of  ESRD (HD TTS), THN, HLD, CHF  Anemia of chronic disease, history of CAD, s/p PCI 08/13/2018, with stents x2, on Plavix,, presented to ED with chief complaint of chest pain started 06/04/2020, took 3 nitroglycerin before arriving to ED, with still having substernal chest pain with some radiation.  Currently chest pain-free   Patient Denies having: Fever, Chills, Cough, SOB, Abd pain, N/V/D, headache, dizziness, lightheadedness,  Dysuria, Joint pain, rash, open wounds  ED Course:   Vitals: Stable temp 98, PR 86, RR 16, BP 129/69, Abnormal labs; hemoglobin 7.6, 7.7, BUN 10, creatinine 2.84, calcium 8.4, BNP 268.2,  troponin 15, 25, 32, 40 Respiratory panel, including SARS-CoV-2 negative  ED discussed with cardiology Dr. Tamala Julian. Started on heparin drip   Review of Systems: As per HPI, otherwise 10 point review of systems were negative.   ----------------------------------------------------------------------------------------------------------------------  Allergies  Allergen Reactions  . Hydrochlorothiazide Other (See Comments)    [facility]    Home MEDs:  Prior to Admission medications   Medication Sig Start Date End Date Taking? Authorizing Provider  atorvastatin (LIPITOR) 80 MG tablet Take 80 mg by mouth daily.    [provider]  cinacalcet (SENSIPAR) 30 MG tablet Take 30-60 mg by mouth See admin  instructions. Take 2 tablets (60 mg) by mouth on Tuesday, Thursday, Saturday (dialysis days), take 1 tablet (30 mg) on Sunday, Monday, Wednesday, Friday (non-dialysis days)    [provider]  clopidogrel (PLAVIX) 75 MG tablet Take 1 tablet (75 mg total) by mouth daily with breakfast. 08/11/18   [name], MD  HYDROcodone-acetaminophen (NORCO/VICODIN) 5-325 MG tablet Take 1 tablet by mouth every 8 (eight) hours as needed for moderate pain or severe pain. 04/28/20   Charlesetta Shanks, MD  HYDROcodone-acetaminophen (NORCO/VICODIN) 5-325 MG tablet Take 1 tablet by mouth every 6 (six) hours as needed for moderate pain or severe pain. Take 1 tablet in the morning and 1 tablet in the evening for pain control 04/28/20   Charlesetta Shanks, MD  insulin NPH-regular Human (70-30) 100 UNIT/ML injection Inject 20 Units into the skin See admin instructions. Inject 20 units subcutaneously in the morning as needed for CBG >140, inject 20 units every evening    [provider]  isosorbide dinitrate (ISORDIL) 20 MG tablet Take 1 tablet (20 mg total) by mouth at bedtime. 05/15/19   Deboraha Sprang, MD  lidocaine-prilocaine (EMLA) cream Apply 1 application topically See admin instructions. Apply topically 30 minutes prior to dialysis on Tuesday, Thursday, Saturday 11/15/18   [provider]  nitroGLYCERIN (NITROSTAT) 0.4 MG SL tablet Place  1 tablet (0.4 mg total) under the tongue every 5 (five) minutes x 3 doses as needed for chest pain. 08/10/18   [name], MD  Nutritional Supplements (NEPRO/CARBSTEADY PO) Take 237 mLs by mouth daily. vanilla    [provider]  sevelamer carbonate (RENVELA) 800 MG tablet Take 1,600 mg by mouth 3 (three) times daily. 05/22/19   [provider]    PRN MEDs: sodium chloride, acetaminophen, HYDROcodone-acetaminophen, nitroGLYCERIN, nitroGLYCERIN, ondansetron (ZOFRAN) IV, sodium chloride flush  Past Medical History:  Diagnosis Date  .  Arthritis   . Chronic kidney disease   . Diabetes mellitus ([place])   . Dialysis patient ([place])   . GERD (gastroesophageal reflux disease)    occ  . Heart murmur   . Hypercholesteremia   . Hypertension   . Psoriasis     Past Surgical History:  Procedure Laterality Date  . CARDIAC CATHETERIZATION  09   "some blockage" with collaterals by cath at [facility] ~ 2009; no intervention required; reportedly, no routine cardiology f/u recommended   . CORONARY ATHERECTOMY N/A 08/09/2018   Procedure: CORONARY ATHERECTOMY;  Surgeon: Jettie Booze, MD;  Location: [place] [facility];  Service: Cardiovascular;  Laterality: N/A;  . CORONARY STENT INTERVENTION N/A 08/09/2018   Procedure: CORONARY STENT INTERVENTION;  Surgeon: Jettie Booze, MD;  Location: [place] [facility];  Service: Cardiovascular;  Laterality: N/A;  . INGUINAL HERNIA REPAIR Right 04/13/2012   Procedure: HERNIA REPAIR INGUINAL INCARCERATED;  Surgeon: Gayland Curry, MD;  Location: [place];  Service: General;  Laterality: Right;  . INSERTION OF MESH Right 04/13/2012   Procedure: INSERTION OF MESH;  Surgeon: Gayland Curry, MD;  Location: [place];  Service: General;  Laterality: Right;  . LEFT HEART CATH N/A 08/09/2018   Procedure: Left Heart Cath;  Surgeon: Jettie Booze, MD;  Location: [place] [facility];  Service: Cardiovascular;  Laterality: N/A;  . LEFT HEART CATH AND CORONARY ANGIOGRAPHY N/A 08/08/2018   Procedure: LEFT HEART CATH AND CORONARY ANGIOGRAPHY;  Surgeon: Troy Sine, MD;  Location: [place] [facility];  Service: Cardiovascular;  Laterality: N/A;  . VASECTOMY       reports that he quit smoking about 33 years ago. His smoking use included cigarettes. He has a 15.00 pack-year smoking history. He has never used smokeless tobacco. He reports that he does not drink alcohol and does not use drugs.   Family History  Problem Relation Age of Onset  . Diabetes Mother   . Stroke Father   . Diabetes Sister   . Diabetes  Brother     Physical Exam:   Vitals:   06/05/20 0004 06/05/20 0005 06/05/20 0415 06/05/20 0859  BP:  137/74 120/65 129/69  Pulse:   79 86  Resp:   16   Temp: 98 F (36.7 C)  97.7 F (36.5 C) 98 F (36.7 C)  TempSrc: Oral  Oral Oral  SpO2: 99%  98% 98%  Weight:      Height:       Constitutional: NAD, calm, comfortable Eyes: PERRL, lids and conjunctivae normal ENMT: Mucous membranes are moist. Posterior pharynx clear of any exudate or lesions.Normal dentition.  Neck: normal, supple, no masses, no thyromegaly Respiratory: clear to auscultation bilaterally, no wheezing, no crackles. Normal respiratory effort. No accessory muscle use.  Cardiovascular: Regular rate and rhythm, no murmurs / rubs / gallops. No extremity edema. 2+ pedal pulses. No carotid bruits.  Abdomen: no tenderness, no masses palpated. No hepatosplenomegaly.  Bowel sounds positive.  Musculoskeletal: no clubbing / cyanosis. No joint deformity upper and lower extremities. Good ROM, no contractures. Normal muscle tone.  Neurologic: CN II-XII grossly intact. Sensation intact, DTR normal. Strength 5/5 in all 4.  Psychiatric: Normal judgment and insight. Alert and oriented x 3. Normal mood.  Skin: no rashes, lesions, ulcers. No induration Decubitus/ulcers: None visible Wounds: per nursing documentation         Labs on admission:    I have personally reviewed following labs and imaging studies  CBC: Recent Labs  Lab 06/04/20 1650 06/05/20 0001  WBC 10.0 8.2  NEUTROABS 8.1*  --   HGB 7.6* 7.7*  HCT 22.9* 23.7*  MCV 90.9 92.2  PLT 320 458   Basic Metabolic Panel: Recent Labs  Lab 06/04/20 1650  NA 135  K 3.6  CL 92*  CO2 28  GLUCOSE 125*  BUN 10  CREATININE 2.84*  CALCIUM 8.4*   GFR: Estimated Creatinine Clearance: 17.7 mL/min (A) (by C-G formula based on SCr of 2.84 mg/dL (H)). Liver Function Tests: Recent Labs  Lab 06/04/20 1650  AST 15  ALT 15  ALKPHOS 79  BILITOT 0.5  PROT 7.7   ALBUMIN 3.3*   No results for input(s): LIPASE, AMYLASE in the last 168 hours. No results for input(s): AMMONIA in the last 168 hours. Coagulation Profile: No results for input(s): INR, PROTIME in the last 168 hours. Cardiac Enzymes: No results for input(s): CKTOTAL, CKMB, CKMBINDEX, TROPONINI in the last 168 hours. BNP (last 3 results) No results for input(s): PROBNP in the last 8760 hours. HbA1C: No results for input(s): HGBA1C in the last 72 hours. CBG: Recent Labs  Lab 06/05/20 0011 06/05/20 0819  GLUCAP 155* 119*   Lipid Profile: No results for input(s): CHOL, HDL, LDLCALC, TRIG, CHOLHDL, LDLDIRECT in the last 72 hours. Thyroid Function Tests: No results for input(s): TSH, T4TOTAL, FREET4, T3FREE, THYROIDAB in the last 72 hours. Anemia Panel: No results for input(s): VITAMINB12, FOLATE, FERRITIN, TIBC, IRON, RETICCTPCT in the last 72 hours. Urine analysis: No results found for: COLORURINE, APPEARANCEUR, [place], [place], GLUCOSEU, [place], BILIRUBINUR, [place], PROTEINUR, UROBILINOGEN, NITRITE, LEUKOCYTESUR   Radiologic Exams on Admission:   DG Chest Port 1 View  Result Date: 06/04/2020 CLINICAL DATA:  Chest pain EXAM: PORTABLE CHEST 1 VIEW COMPARISON:  Chest radiograph dated 03/17/2019 and chest CT dated 03/25/2020. FINDINGS: The heart is enlarged. Vascular calcifications are seen in the aortic arch. A cardiac loop recorder overlies the left chest. A moderate right pleural effusion with associated atelectasis/airspace disease is noted. There is mild left basilar atelectasis. There is no significant left pleural effusion. There is no pneumothorax on either side. Degenerative changes are seen in the spine. IMPRESSION: Moderate right pleural effusion with associated atelectasis/airspace disease. Cardiomegaly. Aortic Atherosclerosis (ICD10-I70.0). Electronically Signed   By: Zerita Boers M.D.   On: 06/04/2020 16:36    EKG:   Independently reviewed.  Orders placed or  performed during the hospital encounter of 06/04/20  . EKG 12-Lead  . EKG 12-Lead  . EKG 12-Lead  . EKG 12-Lead  . EKG 12-Lead (at 6am)  . EKG 12-Lead  . EKG 12-Lead   ---------------------------------------------------------------------------------------------------------------------------------------    Assessment / Plan:   Principal Problem:   NSTEMI (non-ST elevated myocardial infarction) ([place]) Active Problems:   Chest pain   Diabetes mellitus ([place])   Hypertension   Hypercholesteremia   ESRD (end stage renal disease) on dialysis ([place])   Anemia of chronic disease   Type II diabetes  mellitus with renal manifestations ([facility])   GERD (gastroesophageal reflux disease)   CAD (coronary artery disease)   Chronic systolic CHF (congestive heart failure) ([facility])   Principal Problem: Unstable angina-NSTEMI (non-ST elevated myocardial infarction) ([place]) -With extensive history of of CAD, s/p PCI 08/13/2018 -Patient will be admitted to cardiac telemetry unit -Recycle cardiac enzyme, troponins 25, 32, 40, -Patient is on Plavix, aspirin has also been given, started on heparin drip, as needed nitroglycerin, statins, -Cardiologist consulted; planning for left heart catheterization likely 06/06/2020 followed by hemodialysis  Active Problems:    Diabetes mellitus II ([place]) -continue home medication of NPH 70/30, also checking CBG QA CHS, with Isai coverage, check an A1c    Hypertension -with history of hypotension on HD, continue home medication of isosorbide   Hypercholesteremia -continue high-dose statins, checking fasting lipid panel   ESRD (end stage renal disease) on dialysis ([place]) -on hemodialysis TTS, status post hemodialysis 06/04/2020-nephrologist consulted-planning for cardiac catheterization 06/06/2020 followed by hemodialysis    Anemia of chronic disease -monitoring H&H, transfusion per nephrology, ?  Granix  Type II diabetes mellitus with renal manifestations ([place]) -currently  stable on HD, continue NPH   GERD (gastroesophageal reflux disease) -continue PPI, as needed GI cocktail  CAD (coronary artery disease) with 2 stents on 07/2018-continue antiplatelet therapy, statins,  Chronic systolic CHF (congestive heart failure) ([facility])-on hemodialysis, no signs of exacerbation, Monitoring daily weight, last echo 03/18/2019 with ejection fraction of 60-65%,     Cultures:  -none  Antimicrobial: -none  Consults called: Cardiology/nephrology -------------------------------------------------------------------------------------------------------------------------------------------- DVT prophylaxis: Heparin drip Code Status:   Code Status: Full Code   Admission status: Patient will be admitted as Inpatient, with a greater than 2 midnight length of stay. Level of care: Telemetry Cardiac   Family Communication:  none at bedside  (The above findings and plan of care has been discussed with patient in detail, the patient expressed understanding and agreement of above plan)  --------------------------------------------------------------------------------------------------------------------------------------------------  Disposition Plan:  Anticipated 1-2 days Status is: Inpatient  Remains inpatient appropriate because:Inpatient level of care appropriate due to severity of illness   Dispo: The patient is from: Home              Anticipated d/c is to: Home              Patient currently is not medically stable to d/c.  Continues to have angina, likely non-STEMI, will  need continuous IV anticoagulation, heparin-consulting cardiology work-up..  Will need cardiac  catheterization   Difficult to place patient No      -------------------------------------------------------------------------------------------------------------------------------------------  Time spent: > than  55  Min.   SIGNED: Deatra James, MD, FHM. [facility],  Pager (Please use  [email or web address] to page to text)  If 7PM-7AM, please contact night-coverage [email or web address],  06/05/2020, 9:07 AM

## 2020-06-05 NOTE — Progress Notes (Signed)
This encounter was created in error - please disregard.

## 2020-06-05 NOTE — Progress Notes (Signed)
Site area: rt groin arterial sheath pulled jenny Toblinson,RN Site Prior to Removal:  Level 0 Pressure Applied For: 20 minutes Manual:   yes Patient Status During Pull:  stable Post Pull Site:  Level 0 Post Pull Instructions Given:  yes Post Pull Pulses Present: rt pt 1+ and dopplered Dressing Applied:  Gauze and tegaderm Bedrest begins @ 8676 Comments:

## 2020-06-05 NOTE — H&P (View-Only) (Signed)
Progress Note  Patient Name: Nicholas [name] Date of Encounter: 06/05/2020  Primary Cardiologist: Dr. Daneen Schick, MD  Subjective   Chest pain free this AM. Plan for cath today and regular HD tomorrow.   Inpatient Medications    Scheduled Meds:  alum & mag hydroxide-simeth  30 mL Oral Once   And   lidocaine  15 mL Oral Once   Continuous Infusions:  sodium chloride     heparin 950 Units/hr (06/04/20 2146)   PRN Meds: acetaminophen, nitroGLYCERIN, ondansetron (ZOFRAN) IV   Vital Signs    Vitals:   06/04/20 2356 06/05/20 0004 06/05/20 0005 06/05/20 0415  BP:   137/74 120/65  Pulse:    79  Resp:    16  Temp:  98 F (36.7 C)  97.7 F (36.5 C)  TempSrc:  Oral  Oral  SpO2:  99%  98%  Weight: 72.4 kg     Height: 5\' 8"  (1.727 m)       Intake/Output Summary (Last 24 hours) at 06/05/2020 0748 Last data filed at 06/05/2020 0300 Gross per 24 hour  Intake 28.5 ml  Output --  Net 28.5 ml   Filed Weights   06/04/20 1605 06/04/20 2356  Weight: 72 kg 72.4 kg    Physical Exam   PE per MD notation   Labs    Chemistry Recent Labs  Lab 06/04/20 1650  NA 135  K 3.6  CL 92*  CO2 28  GLUCOSE 125*  BUN 10  CREATININE 2.84*  CALCIUM 8.4*  PROT 7.7  ALBUMIN 3.3*  AST 15  ALT 15  ALKPHOS 79  BILITOT 0.5  GFRNONAA 21*  ANIONGAP 15     Hematology Recent Labs  Lab 06/04/20 1650 06/05/20 0001  WBC 10.0 8.2  RBC 2.52* 2.57*  HGB 7.6* 7.7*  HCT 22.9* 23.7*  MCV 90.9 92.2  MCH 30.2 30.0  MCHC 33.2 32.5  RDW 14.9 14.7  PLT 320 308    Cardiac EnzymesNo results for input(s): TROPONINI in the last 168 hours. No results for input(s): TROPIPOC in the last 168 hours.   BNP Recent Labs  Lab 06/04/20 1650  BNP 268.2*     DDimer No results for input(s): DDIMER in the last 168 hours.   Radiology    DG Chest Port 1 View  Result Date: 06/04/2020 CLINICAL DATA:  Chest pain EXAM: PORTABLE CHEST 1 VIEW COMPARISON:  Chest radiograph dated 03/17/2019 and  chest CT dated 03/25/2020. FINDINGS: The heart is enlarged. Vascular calcifications are seen in the aortic arch. A cardiac loop recorder overlies the left chest. A moderate right pleural effusion with associated atelectasis/airspace disease is noted. There is mild left basilar atelectasis. There is no significant left pleural effusion. There is no pneumothorax on either side. Degenerative changes are seen in the spine. IMPRESSION: Moderate right pleural effusion with associated atelectasis/airspace disease. Cardiomegaly. Aortic Atherosclerosis (ICD10-I70.0). Electronically Signed   By: Zerita Boers M.D.   On: 06/04/2020 16:36   Telemetry    06/05/20 NSR with rates in the 80's - Personally Reviewed  ECG    No new tracing as of 06/05/20- Personally Reviewed  Cardiac Studies   Echocardiogram 03/18/2019:   1. Left ventricular ejection fraction, by estimation, is 60 to 65%. The  left ventricle has normal function. The left ventricle has no regional  wall motion abnormalities. Left ventricular diastolic parameters are  consistent with Grade I diastolic  dysfunction (impaired relaxation). Elevated left ventricular end-diastolic  pressure.  2. Right ventricular systolic function is normal. The right ventricular  size is normal. There is normal pulmonary artery systolic pressure.   3. The mitral valve is normal in structure and function. No evidence of  mitral valve regurgitation. No evidence of mitral stenosis.   4. The aortic valve is tricuspid. Aortic valve regurgitation is mild.  Mild aortic valve sclerosis is present, with no evidence of aortic valve  stenosis.   5. The inferior vena cava is normal in size with greater than 50%  respiratory variability, suggesting right atrial pressure of 3 mmHg.    LHC 08/08/2018:  Prox LAD lesion is 90% stenosed. 1st Diag lesion is 80% stenosed. Mid LM to Dist LM lesion is 60% stenosed. 1st Mrg lesion is 100% stenosed. Ost RCA to Prox RCA lesion is  99% stenosed. Mid RCA to Dist RCA lesion is 100% stenosed. Prox Cx lesion is 80% stenosed.   Severe multivessel CAD with coronary and aortic calcification.  There is focal 60% distal left main stenosis; 90% proximal LAD stenosis at the origin of the first diagonal vessel with 80% ostial diagonal stenosis; 80% proximal circumflex stenosis with total occlusion of the circumflex marginal vessel with collateralization from the LAD to the marginal vessel; and total RCA occlusion with left to right collateralization to the PDA and PL branches.   LVEDP 19 mmHg.   RECOMMENDATION: Angiographic findings will be reviewed with Dr. Tamala Julian.  Consider surgical consultation if patient is a CABG candidate.   Diagnostic Dominance: Right    Stent intervention 08/09/2018:       Patient Profile     87 y.o. male with CAD s/p PCI (07/2018, pLAD), ESRD/iHD (T/R/Sa), HLD, and HTN who presented to [facility] ED with substernal chest pressure since 05/12 AM.   Assessment & Plan    1.  Chest pain with history of CAD s/p pLAD PCI 07/2018: -Patient presented with chest pain which began 06/04/2020 at which time he called for an appointment with Dr. Tamala Julian who recommended ED evaluation given his history.  Apparently, he has been having consistent exertional angina relieved with rest for the last year.  More recently, prior to HD on Thursday he began having acute onset of centralized nonradiating chest pain relieved with nitroglycerin with several subsequent episodes also relieved with nitroglycerin. -HST, 25>> 32>> 40 -Last intervention with NSTEMI 07/2018 treated pLAD with orbital arthrectomy and stenting -Last echocardiogram 03/18/2019 with LVEF at 60 to 65% with G1 DD and no evidence of valvular disease -On PTA Plavix, does not appear to be on ASA and high intensity atorvastatin -Start low dose beta block in the post cath setting  -Plan for repeat LHC given unstable anginal symptoms>>>post cath HD tomorrow per nephrology   -Cardiac catheterization was discussed with the patient fully. The patient understands that risks include but are not limited to stroke (1 in 1000), death (1 in 72), kidney failure [usually temporary] (1 in 500), bleeding (1 in 200), allergic reaction [possibly serious] (1 in 200).  The patient understands and is willing to proceed.    2.  ESRD: -HD TTHS -Follows with nephrology -Received full treatment Thursday -Creatinine, 2.84 -BNP on ED arrival 268>> fluid volume managed with HD -Will need post cath HD as scheduled tomorrow per nephrology   3.  HTN: -Stable, 120/65, 137/74, 123/71 -Not currently on any antihypertensives  4.  HLD: -Last LDL unable to be calculated with triglycerides greater than 200 and an HDL at 68 from 08/06/2018 -Unclear if patient is taking  high intensity atorvastatin -If so, will need lipid clinic referral for possible PCSK9 inhibitor  5.  DM2: -SSI for glucose control while inpatient -Per primary team   Signed, Kathyrn Drown NP-C [facility] Pager: [phone] 06/05/2020, 7:48 AM     Patient examined chart reviewed Discussed care with patient and NP. Increasing angina with resting symptoms now complicating his dialysis  Exam with black male in no distress Fistula LUE with thrill No murmur lungs clear trace edema KNown CAD with stent to LAD 06/04/20 occluded Mid RCA with collaterals and occluded OM. Discussed cath including risks of stroke, bleeding, has had contrast with no issues Not a CABG  Candidate with age and renal failure. ? RFA access given dialysis Had dialysis yesterday and volume status looks fine   Jenkins Rouge MD [facility]  For questions or updates, please contact   Please consult [email or web address] for contact info under Cardiology/STEMI.

## 2020-06-06 ENCOUNTER — Inpatient Hospital Stay (HOSPITAL_COMMUNITY): Payer: Medicare Other

## 2020-06-06 DIAGNOSIS — D649 Anemia, unspecified: Secondary | ICD-10-CM | POA: Diagnosis not present

## 2020-06-06 DIAGNOSIS — I214 Non-ST elevation (NSTEMI) myocardial infarction: Secondary | ICD-10-CM | POA: Diagnosis not present

## 2020-06-06 DIAGNOSIS — D638 Anemia in other chronic diseases classified elsewhere: Secondary | ICD-10-CM

## 2020-06-06 DIAGNOSIS — E1122 Type 2 diabetes mellitus with diabetic chronic kidney disease: Secondary | ICD-10-CM

## 2020-06-06 DIAGNOSIS — Z992 Dependence on renal dialysis: Secondary | ICD-10-CM

## 2020-06-06 DIAGNOSIS — R079 Chest pain, unspecified: Secondary | ICD-10-CM

## 2020-06-06 DIAGNOSIS — N2889 Other specified disorders of kidney and ureter: Secondary | ICD-10-CM | POA: Diagnosis present

## 2020-06-06 DIAGNOSIS — Z794 Long term (current) use of insulin: Secondary | ICD-10-CM

## 2020-06-06 DIAGNOSIS — N186 End stage renal disease: Secondary | ICD-10-CM | POA: Diagnosis not present

## 2020-06-06 LAB — RENAL FUNCTION PANEL
Albumin: 2.9 g/dL — ABNORMAL LOW (ref 3.5–5.0)
Anion gap: 13 (ref 5–15)
BUN: 43 mg/dL — ABNORMAL HIGH (ref 8–23)
CO2: 27 mmol/L (ref 22–32)
Calcium: 8.6 mg/dL — ABNORMAL LOW (ref 8.9–10.3)
Chloride: 96 mmol/L — ABNORMAL LOW (ref 98–111)
Creatinine, Ser: 6.55 mg/dL — ABNORMAL HIGH (ref 0.61–1.24)
GFR, Estimated: 8 mL/min — ABNORMAL LOW (ref >60)
Glucose, Bld: 157 mg/dL — ABNORMAL HIGH (ref 70–99)
Phosphorus: 3.9 mg/dL (ref 2.5–4.6)
Potassium: 4.3 mmol/L (ref 3.5–5.1)
Sodium: 136 mmol/L (ref 135–145)

## 2020-06-06 LAB — CBC
HCT: 17.8 % — ABNORMAL LOW (ref 39.0–52.0)
HCT: 26.2 % — ABNORMAL LOW (ref 39.0–52.0)
Hemoglobin: 5.7 g/dL — CL (ref 13.0–17.0)
Hemoglobin: 8.8 g/dL — ABNORMAL LOW (ref 13.0–17.0)
MCH: 29.5 pg (ref 26.0–34.0)
MCH: 30.3 pg (ref 26.0–34.0)
MCHC: 32 g/dL (ref 30.0–36.0)
MCHC: 33.6 g/dL (ref 30.0–36.0)
MCV: 92.2 fL (ref 80.0–100.0)
MCV: 90.3 fL (ref 80.0–100.0)
Platelets: 259 10*3/uL (ref 150–400)
Platelets: 360 10*3/uL (ref 150–400)
RBC: 1.93 MIL/uL — ABNORMAL LOW (ref 4.22–5.81)
RBC: 2.9 MIL/uL — ABNORMAL LOW (ref 4.22–5.81)
RDW: 14.9 % (ref 11.5–15.5)
RDW: 14.5 % (ref 11.5–15.5)
WBC: 7.5 10*3/uL (ref 4.0–10.5)
WBC: 7.2 10*3/uL (ref 4.0–10.5)
nRBC: 0 % (ref 0.0–0.2)
nRBC: 0 % (ref 0.0–0.2)

## 2020-06-06 LAB — GLUCOSE, CAPILLARY
Glucose-Capillary: 91 mg/dL (ref 70–99)
Glucose-Capillary: 166 mg/dL — ABNORMAL HIGH (ref 70–99)
Glucose-Capillary: 113 mg/dL — ABNORMAL HIGH (ref 70–99)
Glucose-Capillary: 219 mg/dL — ABNORMAL HIGH (ref 70–99)

## 2020-06-06 LAB — HEPATITIS B SURFACE ANTIGEN: Hepatitis B Surface Ag: NONREACTIVE

## 2020-06-06 LAB — PREPARE RBC (CROSSMATCH)

## 2020-06-06 IMAGING — CT CT ABD-PELV W/O CM
2 of 4 series · 16 of 46 positions shown, 18 images · non-contrast
Comparison: None.

CLINICAL DATA: Retroperitoneal hematoma suspected, pt unable to
bring arms above head

EXAM:
CT ABDOMEN AND PELVIS WITHOUT CONTRAST
TECHNIQUE: Multidetector CT imaging of the abdomen and pelvis was performed
following the standard protocol without IV contrast.

[Series 3: ap without (person_name) · axial · non-contrast · 0.88mm/px · z∈[-375,+10]mm · 13 of 89 slices shown, 15 images]
[im 6/89  soft-tissue]
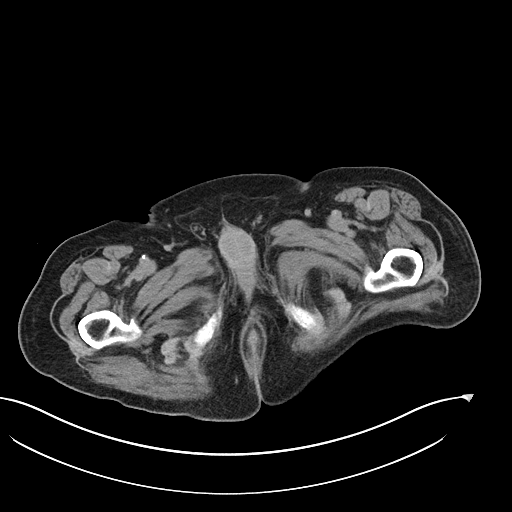
[im 6/89  bone]
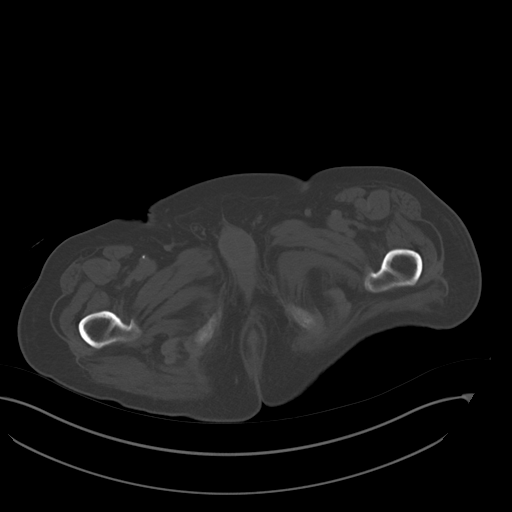
[im 11/89  soft-tissue]
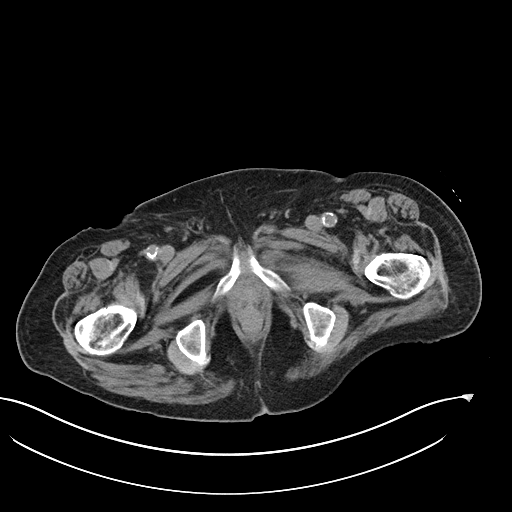
[im 21/89  soft-tissue]
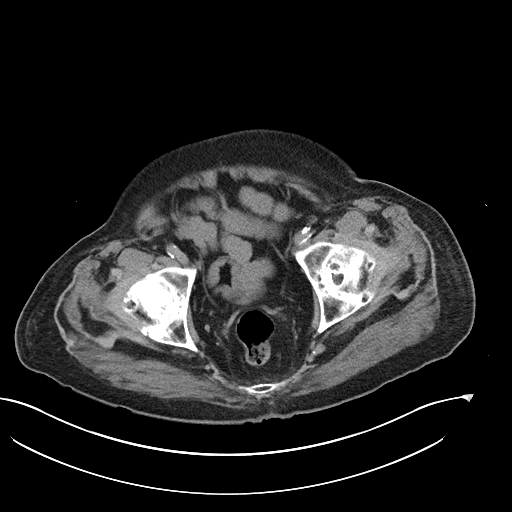
[im 26/89  soft-tissue]
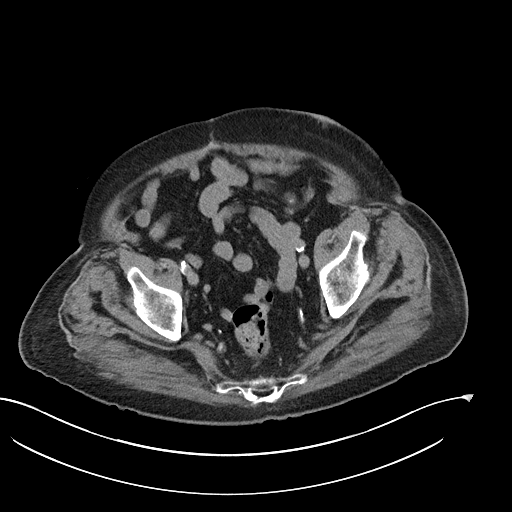
[im 32/89  soft-tissue]
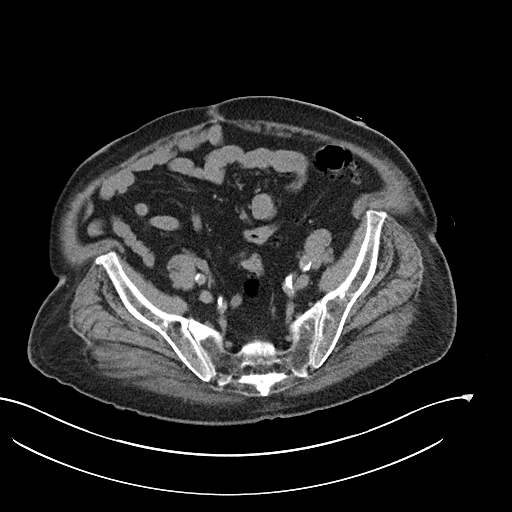
[im 37/89  soft-tissue]
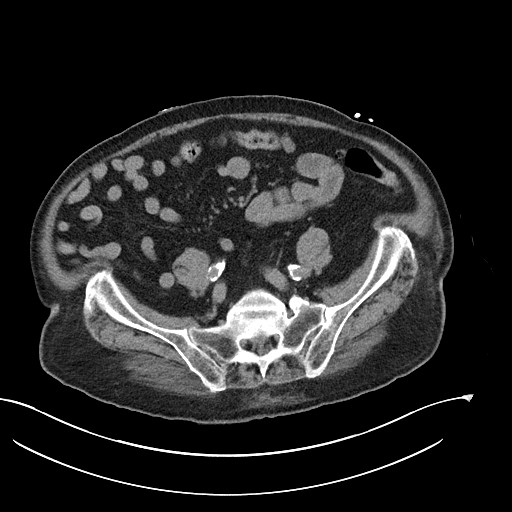
[im 47/89  soft-tissue]
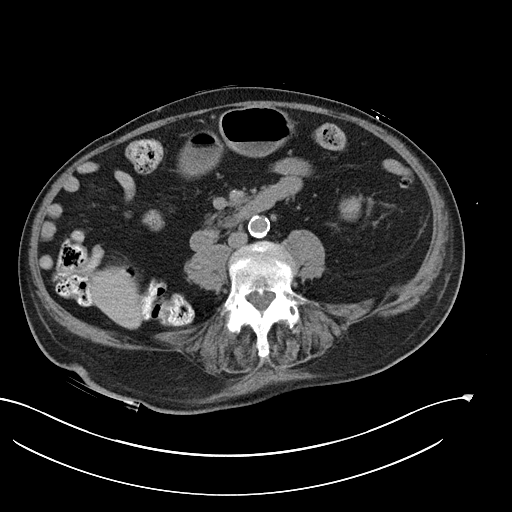
[im 52/89  soft-tissue]
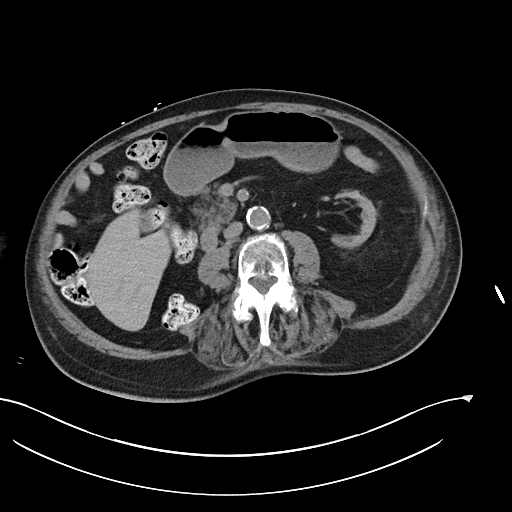
[im 57/89  soft-tissue]
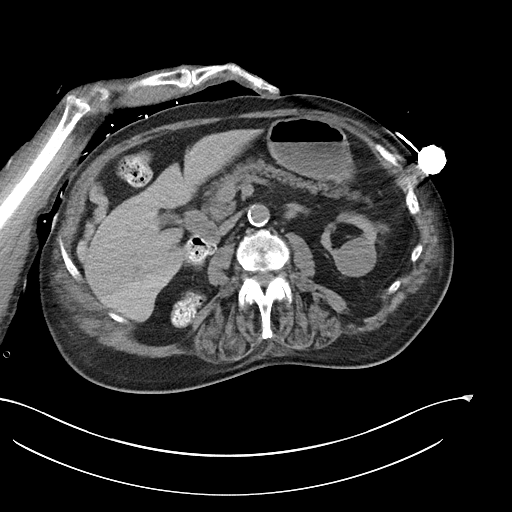
[im 57/89  bone]
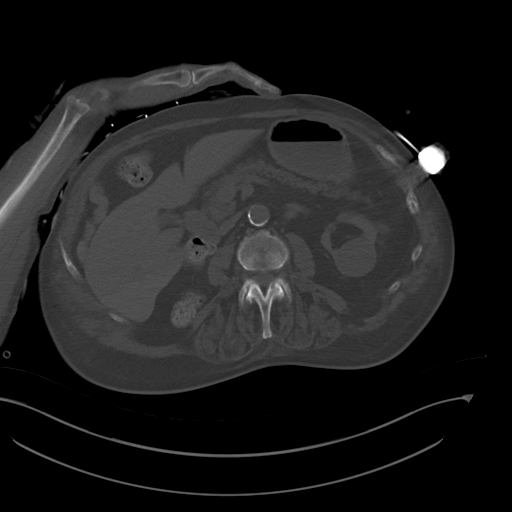
[im 63/89  soft-tissue]
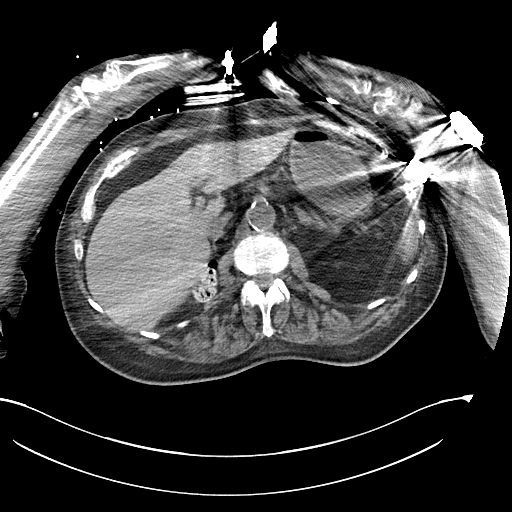
[im 68/89  soft-tissue]
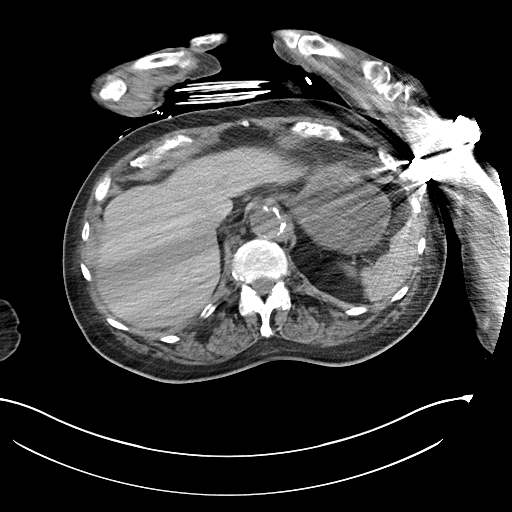
[im 78/89  soft-tissue]
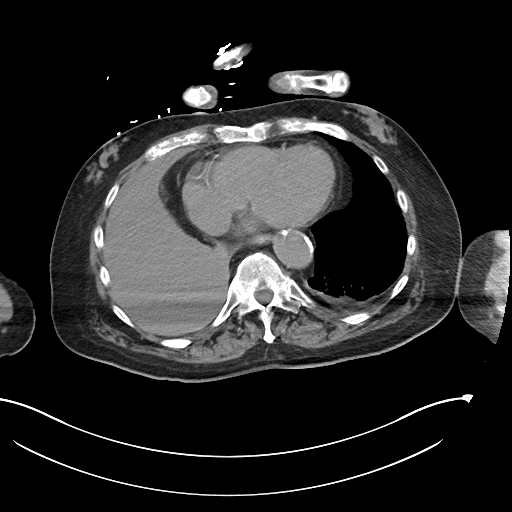
[im 83/89  soft-tissue]
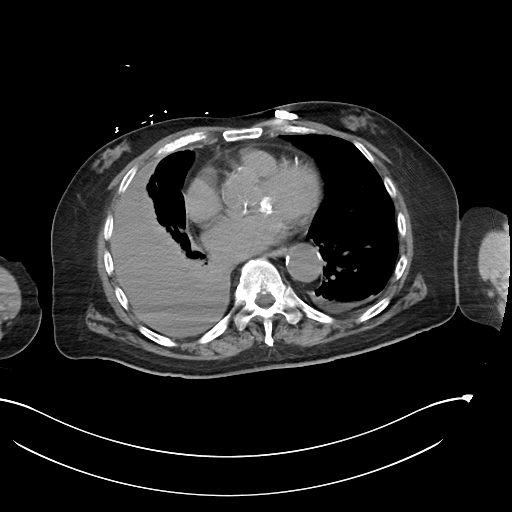

[Series 6: (person_name) · coronal · 0.73mm/px · 3 of 104 slices shown]
[im 35/104  soft-tissue]
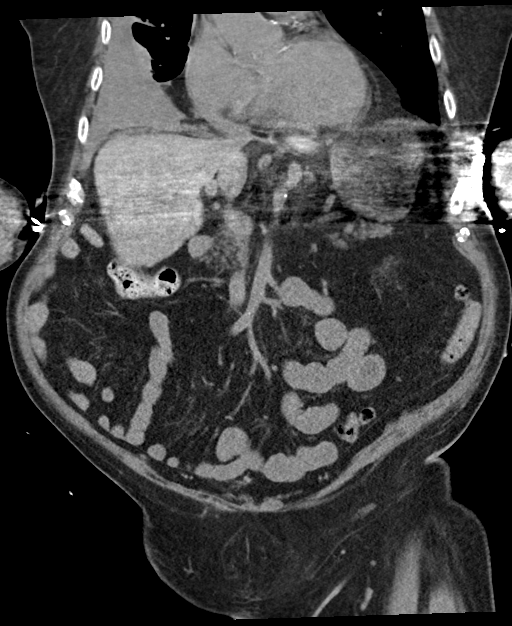
[im 46/104  soft-tissue]
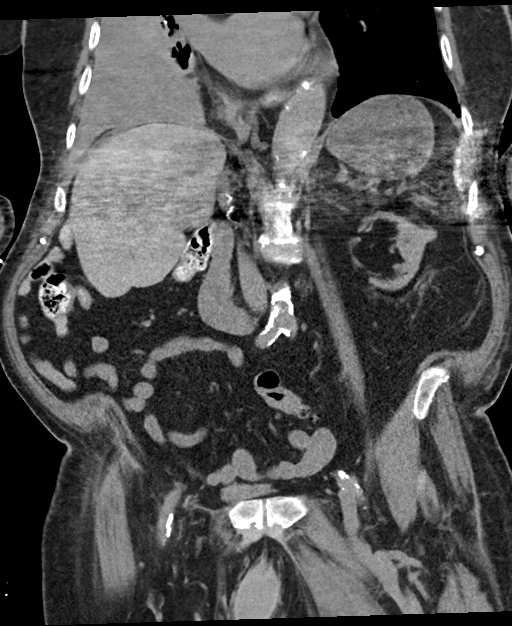
[im 58/104  soft-tissue]
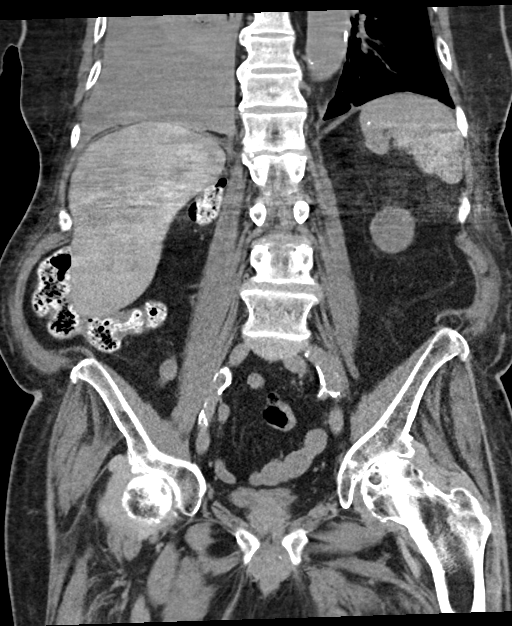

[16 of 46 positions shown; findings below may reference images not displayed]

FINDINGS: Evaluation of the upper abdomen somewhat limited by patient unable
to raise arms.

Lower chest: Large right pleural effusion and adjacent opacities
likely representing atelectasis. Small left pleural effusion.
Gynecomastia.

Hepatobiliary: No focal liver abnormality is seen. Gallbladder is
decompressed.

Pancreas: Unremarkable. No surrounding inflammatory changes.

Spleen: Normal in size.  Scattered granulomas.

Adrenals/Urinary Tract: Adrenal glands are unremarkable. There is a
3.3 cm mass in the superior left kidney with attenuation greater
than expected for a simple cyst, previously measuring 3.0 cm no
hydronephrosis or renal calculi. The right kidney is surgically
absent. There is an additional smaller solid exophytic mass
laterally in the left kidney measuring approximately 1.1 cm,
previously 0.9 cm. Urinary bladder unremarkable.

Stomach/Bowel: Stomach is within normal limits. Appendix not
definitely visualized. No evidence of bowel wall thickening,
distention, or inflammatory changes. Multiple colonic diverticula.

Vascular/Lymphatic: Aortic atherosclerosis. Vascular patency cannot
be assessed in the absence IV contrast no enlarged abdominal or
pelvic lymph nodes.

Reproductive: Prostate is unremarkable.

Other: No abdominopelvic ascites. No evidence of retroperitoneal
hematoma.

Musculoskeletal: Severe degenerative changes in the bilateral hips.
Lower lumbar facet arthrosis.
IMPRESSION: 1. No evidence of retroperitoneal hematoma or other acute
intra-abdominal process on a noncontrast scan.
2. Large right pleural effusion and adjacent opacities likely
representing atelectasis. Small left pleural effusion.
3. There is a 3.3 cm mass in the superior left kidney with
attenuation greater than expected for a simple cyst, previously
measuring 3.0 cm. There is an additional smaller solid exophytic
mass laterally in the left kidney measuring approximately 1.1 cm,
previously 0.9 cm. Recommend nonemergent CT or MRI renal protocol or
further characterization.
4. Aortic atherosclerosis.

Aortic Atherosclerosis ([VG]-[VG]).

## 2020-06-06 MED ORDER — SODIUM CHLORIDE 0.9% IV SOLUTION: Freq: Once | INTRAVENOUS | Status: AC

## 2020-06-06 MED ORDER — HEPARIN SODIUM (PORCINE) 5000 UNIT/ML IJ SOLN
5000.0000 [IU] | Freq: Three times a day (TID) | INTRAMUSCULAR | Status: DC
  Filled 2020-06-06: qty 1

## 2020-06-06 MED ORDER — ISOSORBIDE DINITRATE 20 MG PO TABS
20.0000 mg | ORAL_TABLET | Freq: Two times a day (BID) | ORAL | Status: DC
  Administered 2020-06-06 – 2020-06-08 (×4): 20 mg via ORAL
  Filled 2020-06-06 (×6): qty 1

## 2020-06-06 MED ORDER — INSULIN ASPART 100 UNIT/ML IJ SOLN
0.0000 [IU] | Freq: Every day | INTRAMUSCULAR | Status: DC
Start: 2020-06-06 — End: 2020-06-08
  Administered 2020-06-06: 2 [IU] via SUBCUTANEOUS

## 2020-06-06 NOTE — Progress Notes (Signed)
Progress Note  Patient Name: Nicholas [name] Date of Encounter: 06/06/2020  Primary Cardiologist: Dr. Daneen Schick, MD  Subjective    Denies any chest pain or dyspnea  Inpatient Medications    Scheduled Meds: . sodium chloride   Intravenous Once  . atorvastatin  80 mg Oral Daily  . Chlorhexidine Gluconate Cloth  6 each Topical Q0600  . cinacalcet  60 mg Oral Once per day on Tue Thu Sat   And  . [START ON 06/07/2020] cinacalcet  30 mg Oral Once per day on Sun Mon Wed Fri  . clopidogrel  75 mg Oral Q breakfast  . feeding supplement (NEPRO CARB STEADY)  237 mL Oral BID BM  . [START ON 06/07/2020] heparin  5,000 Units Subcutaneous Q8H  . insulin aspart  0-6 Units Subcutaneous TID WC  . insulin aspart protamine- aspart  20 Units Subcutaneous BID WC  . isosorbide dinitrate  20 mg Oral QHS  . multivitamin  1 tablet Oral QHS  . sevelamer carbonate  1,600 mg Oral TID  . sodium chloride flush  3 mL Intravenous Q12H  . sodium chloride flush  3 mL Intravenous Q12H   Continuous Infusions: . sodium chloride     PRN Meds: sodium chloride, acetaminophen, acetaminophen, HYDROcodone-acetaminophen, nitroGLYCERIN, nitroGLYCERIN, ondansetron (ZOFRAN) IV, ondansetron (ZOFRAN) IV, oxyCODONE, sodium chloride flush   Vital Signs    Vitals:   06/06/20 0700 06/06/20 0843 06/06/20 0845 06/06/20 0907  BP:  (!) 117/58 (!) 117/58 (!) 114/55  Pulse:  85 84 84  Resp:  18  16  Temp:  97.6 F (36.4 C)  97.8 F (36.6 C)  TempSrc:  Oral  Oral  SpO2:  100% 100% 100%  Weight: 72.1 kg     Height:        Intake/Output Summary (Last 24 hours) at 06/06/2020 1118 Last data filed at 06/06/2020 0754 Gross per 24 hour  Intake 330.79 ml  Output --  Net 330.79 ml   Filed Weights   06/04/20 2356 06/05/20 0841 06/06/20 0700  Weight: 72.4 kg 72.4 kg 72.1 kg    Physical Exam   GEN: Well nourished, well developed, in no acute distress HEENT: normal Neck: no JVD Cardiac: RRR; 2/6 systolic  murmur Respiratory:  clear to auscultation bilaterally, normal work of breathing GI: soft, nontender, nondistended MS: no deformity or atrophy.  RFA access site with no hematoma Skin: warm and dry, no rash Neuro:  Alert and Oriented x 3 Psych: normal affect   Labs    Chemistry Recent Labs  Lab 06/04/20 1650 06/05/20 0740  NA 135  --   K 3.6  --   CL 92*  --   CO2 28  --   GLUCOSE 125*  --   BUN 10  --   CREATININE 2.84* 3.88*  CALCIUM 8.4*  --   PROT 7.7  --   ALBUMIN 3.3*  --   AST 15  --   ALT 15  --   ALKPHOS 79  --   BILITOT 0.5  --   GFRNONAA 21* 14*  ANIONGAP 15  --      Hematology Recent Labs  Lab 06/04/20 1650 06/05/20 0001 06/06/20 0136  WBC 10.0 8.2 7.5  RBC 2.52* 2.57* 1.93*  HGB 7.6* 7.7* 5.7*  HCT 22.9* 23.7* 17.8*  MCV 90.9 92.2 92.2  MCH 30.2 30.0 29.5  MCHC 33.2 32.5 32.0  RDW 14.9 14.7 14.9  PLT 320 308 259    Cardiac EnzymesNo  results for input(s): TROPONINI in the last 168 hours. No results for input(s): TROPIPOC in the last 168 hours.   BNP Recent Labs  Lab 06/04/20 1650  BNP 268.2*     DDimer No results for input(s): DDIMER in the last 168 hours.   Radiology    CARDIAC CATHETERIZATION  Result Date: 06/05/2020  Mid LAD lesion is 60% stenosed.   40 to 60% eccentric distal left main not greatly different than on prior angiography.  Left main is difficult to lay out and not easily visualized due to a loop recorder that block imaging.  The peviously stented proximal LAD contains 30% in-stent restenosis.  The jailed diagonal is totally occluded but supplied by collaterals.  The mid LAD beyond the stent contains a 50-70 % eccentric narrowing unchanged from prior imaging.  Circumflex territory is small.  The proximal vessel contains 80% stenosis and is unchanged compared to the prior angiogram.  A large obtuse marginal is totally occluded and supplied by left to left collaterals.  Right coronary is totally occluded in the distal  vessel receives right to right and left to right collaterals.  LVEDP 10 mmHg.  Ventriculography was not performed. RECOMMENDATIONS:  Compared to prior images, left main is about the same as before.  Symptoms could be related to the left main.  The patient's age, comorbidities, and unwillingness to consider coronary bypass grafting leave few options for management.  PCI would be single remaining vessel and high risk.  Discussed with patient who prefers conservative management at this point.  Continue medical therapy.  Guarded prognosis.   DG Chest Port 1 View  Result Date: 06/04/2020 CLINICAL DATA:  Chest pain EXAM: PORTABLE CHEST 1 VIEW COMPARISON:  Chest radiograph dated 03/17/2019 and chest CT dated 03/25/2020. FINDINGS: The heart is enlarged. Vascular calcifications are seen in the aortic arch. A cardiac loop recorder overlies the left chest. A moderate right pleural effusion with associated atelectasis/airspace disease is noted. There is mild left basilar atelectasis. There is no significant left pleural effusion. There is no pneumothorax on either side. Degenerative changes are seen in the spine. IMPRESSION: Moderate right pleural effusion with associated atelectasis/airspace disease. Cardiomegaly. Aortic Atherosclerosis (ICD10-I70.0). Electronically Signed   By: Zerita Boers M.D.   On: 06/04/2020 16:36   Telemetry    06/05/20 NSR with rates in the 80's - Personally Reviewed  ECG    No new tracing as of 06/05/20- Personally Reviewed  Cardiac Studies   Echocardiogram 03/18/2019:   1. Left ventricular ejection fraction, by estimation, is 60 to 65%. The  left ventricle has normal function. The left ventricle has no regional  wall motion abnormalities. Left ventricular diastolic parameters are  consistent with Grade I diastolic  dysfunction (impaired relaxation). Elevated left ventricular end-diastolic  pressure.   2. Right ventricular systolic function is normal. The right ventricular   size is normal. There is normal pulmonary artery systolic pressure.   3. The mitral valve is normal in structure and function. No evidence of  mitral valve regurgitation. No evidence of mitral stenosis.   4. The aortic valve is tricuspid. Aortic valve regurgitation is mild.  Mild aortic valve sclerosis is present, with no evidence of aortic valve  stenosis.   5. The inferior vena cava is normal in size with greater than 50%  respiratory variability, suggesting right atrial pressure of 3 mmHg.    LHC 08/08/2018:   Prox LAD lesion is 90% stenosed.  1st Diag lesion is 80% stenosed.  Mid LM  to Dist LM lesion is 60% stenosed.  1st Mrg lesion is 100% stenosed.  Ost RCA to Prox RCA lesion is 99% stenosed.  Mid RCA to Dist RCA lesion is 100% stenosed.  Prox Cx lesion is 80% stenosed.   Severe multivessel CAD with coronary and aortic calcification.  There is focal 60% distal left main stenosis; 90% proximal LAD stenosis at the origin of the first diagonal vessel with 80% ostial diagonal stenosis; 80% proximal circumflex stenosis with total occlusion of the circumflex marginal vessel with collateralization from the LAD to the marginal vessel; and total RCA occlusion with left to right collateralization to the PDA and PL branches.   LVEDP 19 mmHg.   RECOMMENDATION: Angiographic findings will be reviewed with Dr. Tamala Julian.  Consider surgical consultation if patient is a CABG candidate.   Diagnostic Dominance: Right    Stent intervention 08/09/2018:       Patient Profile     87 y.o. male with CAD s/p PCI (07/2018, pLAD), ESRD/iHD (T/R/Sa), HLD, and HTN who presented to [facility] ED with substernal chest pressure since 05/12 AM.   Assessment & Plan    Chest pain with history of CAD s/p pLAD PCI 07/2018: -Patient presented with chest pain which began 06/04/2020 at which time he called for an appointment with Dr. Tamala Julian who recommended ED evaluation given his history.  Apparently, he has  been having consistent exertional angina relieved with rest for the last year.  More recently, prior to HD on Thursday he began having acute onset of centralized nonradiating chest pain relieved with nitroglycerin with several subsequent episodes also relieved with nitroglycerin. -HST, 25>> 32>> 40 -Last intervention with NSTEMI 07/2018 treated pLAD with orbital arthrectomy and stenting -Last echocardiogram 03/18/2019 with LVEF at 60 to 65% with G1 DD and no evidence of valvular disease -On PTA Plavix, does not appear to be on ASA and high intensity atorvastatin -Cath yesterday showed 40 to 60% distal left main stenosis, 30% in-stent restenosis of proximal LAD, occluded diagonal supplied by collaterals, 50 to 70% mid LAD stenosis, proximal LCx 80%, occluded OM, occluded RCA.  Conservative management recommended.  Will increase Isordil to 20 mg twice daily  Acute anemia: Hemoglobin down to 5.7 this morning.  2 units PRBCs ordered.  Right femoral access for cath yesterday.  No hematoma at access site.  CT abdomen pelvis showed no evidence of bleed  ESRD: -HD TTHS -Follows with nephrology -Received full treatment Thursday -BNP on ED arrival 268>> fluid volume managed with HD -Will need post cath HD as scheduled tomorrow per nephrology   HTN: -Not currently on any antihypertensives  HLD: -Last LDL unable to be calculated with triglycerides greater than 200 and an HDL at 68 from 08/06/2018 -Unclear if patient is taking high intensity atorvastatin -If so, will need lipid clinic referral for possible PCSK9 inhibitor  DM2: -SSI for glucose control while inpatient -Per primary team   Donato Heinz, MD

## 2020-06-06 NOTE — Progress Notes (Signed)
PROGRESS NOTE    Nicholas [name]  JAS:[id number] DOB: 1930/08/30 DOA: 06/04/2020 PCP: [facility]    Brief Narrative:  87 y/o male with known CAD, ESRD, DM, admitted to the hospital with chest pain. He underwent cardia cath on 5/13 which did not show any significant change in disease from prior cath. Recommendations were for medical management. Post cath, patient developed acute anemia with hemoglobin of 5.7. He is being transfused PRBC. Nephrology following for HD needs.   Assessment & Plan:   Principal Problem:   NSTEMI (non-ST elevated myocardial infarction) ([place]) Active Problems:   Diabetes mellitus ([place])   Hypertension   Hypercholesteremia   ESRD (end stage renal disease) on dialysis ([place])   Anemia of chronic disease   Type II diabetes mellitus with renal manifestations ([facility])   GERD (gastroesophageal reflux disease)   CAD (coronary artery disease)   Chronic systolic CHF (congestive heart failure) ([facility])   Chest pain   Unstable Angina -s/p cardiac cath on 5/13 -results did not indicate significant change from prior cath, and there did appear to be any options for PCI -recommendations were for medical management -currently on plavix, nitrates, statin -cardiology following  Acute on chronic anemia -patient does have anemia of chronic kidney disease -baseline hemoglobin appears to be around 7-8 -hemoglobin dropped to 5.7 this morning -he does not have any obvious bleeding -his cath site does not show a large hematoma and he has not had any evidence of GI bleeding -will check CT abdomen to evaluate for retroperitoneal hematoma -2 units prbc have been ordered  ESRD on HD -nephrology following -patient will undergo dialysis today  Diabetes Mellitus, type 2, insulin dependent -currently on home dose of 70/30 -supplement with sliding scale -A1C of 7.9  HLD -continue statin     DVT prophylaxis: heparin injection 5,000 Units Start: 06/07/20 0600 SCDs  Start: 06/05/20 4193  Code Status: full code Family Communication: discussed with patient, no family at bedside Disposition Plan: Status is: Inpatient  Remains inpatient appropriate because:Ongoing diagnostic testing needed not appropriate for outpatient work up, IV treatments appropriate due to intensity of illness or inability to take PO and Inpatient level of care appropriate due to severity of illness   Dispo: The patient is from: Home              Anticipated d/c is to: Home              Patient currently is not medically stable to d/c.   Difficult to place patient No         Consultants:   Cardiology  nephrology  Procedures:   5/13 Cardiac cath  Antimicrobials:      Subjective: He feels weak and lightheaded. He has not had any bowel movements, so no blood in stool. He does complain of some lower back pain  Objective: Vitals:   06/06/20 0700 06/06/20 0843 06/06/20 0845 06/06/20 0907  BP:  (!) 117/58 (!) 117/58 (!) 114/55  Pulse:  85 84 84  Resp:  18  16  Temp:  97.6 F (36.4 C)  97.8 F (36.6 C)  TempSrc:  Oral  Oral  SpO2:  100% 100% 100%  Weight: 72.1 kg     Height:        Intake/Output Summary (Last 24 hours) at 06/06/2020 1024 Last data filed at 06/06/2020 0754 Gross per 24 hour  Intake 330.79 ml  Output --  Net 330.79 ml   Filed Weights   06/04/20 2356 06/05/20 0841  06/06/20 0700  Weight: 72.4 kg 72.4 kg 72.1 kg    Examination:  General exam: Appears calm and comfortable  Respiratory system: crackles at bases. Respiratory effort normal. Cardiovascular system: S1 & S2 heard, RRR. No JVD, murmurs, rubs, gallops or clicks. No pedal edema. Gastrointestinal system: Abdomen is nondistended, soft and nontender. No organomegaly or masses felt. Normal bowel sounds heard. Central nervous system: Alert and oriented. No focal neurological deficits. Extremities: Symmetric 5 x 5 power. Right groin site looks clean, no bruising or hematoma  appreciated Skin: No rashes, lesions or ulcers Psychiatry: Judgement and insight appear normal. Mood & affect appropriate.     Data Reviewed: I have personally reviewed following labs and imaging studies  CBC: Recent Labs  Lab 06/04/20 1650 06/05/20 0001 06/06/20 0136  WBC 10.0 8.2 7.5  NEUTROABS 8.1*  --   --   HGB 7.6* 7.7* 5.7*  HCT 22.9* 23.7* 17.8*  MCV 90.9 92.2 92.2  PLT 320 308 354   Basic Metabolic Panel: Recent Labs  Lab 06/04/20 1650 06/05/20 0740  NA 135  --   K 3.6  --   CL 92*  --   CO2 28  --   GLUCOSE 125*  --   BUN 10  --   CREATININE 2.84* 3.88*  CALCIUM 8.4*  --    GFR: Estimated Creatinine Clearance: 13 mL/min (A) (by C-G formula based on SCr of 3.88 mg/dL (H)). Liver Function Tests: Recent Labs  Lab 06/04/20 1650  AST 15  ALT 15  ALKPHOS 79  BILITOT 0.5  PROT 7.7  ALBUMIN 3.3*   No results for input(s): LIPASE, AMYLASE in the last 168 hours. No results for input(s): AMMONIA in the last 168 hours. Coagulation Profile: No results for input(s): INR, PROTIME in the last 168 hours. Cardiac Enzymes: No results for input(s): CKTOTAL, CKMB, CKMBINDEX, TROPONINI in the last 168 hours. BNP (last 3 results) No results for input(s): PROBNP in the last 8760 hours. HbA1C: Recent Labs    06/05/20 0001  HGBA1C 7.9*   CBG: Recent Labs  Lab 06/05/20 0819 06/05/20 1157 06/05/20 1606 06/05/20 2028 06/06/20 0756  GLUCAP 119* 102* 207* 199* 91   Lipid Profile: Recent Labs    06/05/20 0740  CHOL 198  HDL 62  LDLCALC 122*  TRIG 71  CHOLHDL 3.2   Thyroid Function Tests: No results for input(s): TSH, T4TOTAL, FREET4, T3FREE, THYROIDAB in the last 72 hours. Anemia Panel: No results for input(s): VITAMINB12, FOLATE, FERRITIN, TIBC, IRON, RETICCTPCT in the last 72 hours. Sepsis Labs: No results for input(s): PROCALCITON, LATICACIDVEN in the last 168 hours.  Recent Results (from the past 240 hour(s))  Resp Panel by RT-PCR (Flu A&B,  Covid) Nasopharyngeal Swab     Status: None   Collection Time: 06/04/20  5:12 PM   Specimen: Nasopharyngeal Swab; Nasopharyngeal(NP) swabs in vial transport medium  Result Value Ref Range Status   SARS Coronavirus 2 by RT PCR NEGATIVE NEGATIVE Final    Comment: (NOTE) SARS-CoV-2 target nucleic acids are NOT DETECTED.  The SARS-CoV-2 RNA is generally detectable in upper respiratory specimens during the acute phase of infection. The lowest concentration of SARS-CoV-2 viral copies this assay can detect is 138 copies/mL. A negative result does not preclude SARS-Cov-2 infection and should not be used as the sole basis for treatment or other patient management decisions. A negative result may occur with  improper specimen collection/handling, submission of specimen other than nasopharyngeal swab, presence of viral mutation(s) within the areas  targeted by this assay, and inadequate number of viral copies(<138 copies/mL). A negative result must be combined with clinical observations, patient history, and epidemiological information. The expected result is Negative.  Fact Sheet for Patients:  EntrepreneurPulse.com.au  Fact Sheet for Healthcare Providers:  IncredibleEmployment.be  This test is no t yet approved or cleared by the [place] FDA and  has been authorized for detection and/or diagnosis of SARS-CoV-2 by FDA under an Emergency Use Authorization (EUA). This EUA will remain  in effect (meaning this test can be used) for the duration of the COVID-19 declaration under Section 564(b)(1) of the Act, 21 U.S.C.section 360bbb-3(b)(1), unless the authorization is terminated  or revoked sooner.       Influenza A by PCR NEGATIVE NEGATIVE Final   Influenza B by PCR NEGATIVE NEGATIVE Final    Comment: (NOTE) The Xpert Xpress SARS-CoV-2/FLU/RSV plus assay is intended as an aid in the diagnosis of influenza from Nasopharyngeal swab specimens and should  not be used as a sole basis for treatment. Nasal washings and aspirates are unacceptable for Xpert Xpress SARS-CoV-2/FLU/RSV testing.  Fact Sheet for Patients: EntrepreneurPulse.com.au  Fact Sheet for Healthcare Providers: IncredibleEmployment.be  This test is not yet approved or cleared by the [place] FDA and has been authorized for detection and/or diagnosis of SARS-CoV-2 by FDA under an Emergency Use Authorization (EUA). This EUA will remain in effect (meaning this test can be used) for the duration of the COVID-19 declaration under Section 564(b)(1) of the Act, 21 U.S.C. section 360bbb-3(b)(1), unless the authorization is terminated or revoked.  Performed at [facility], [address], [place], [place] [address]          Radiology Studies: CARDIAC CATHETERIZATION  Result Date: 06/05/2020  Mid LAD lesion is 60% stenosed.   40 to 60% eccentric distal left main not greatly different than on prior angiography.  Left main is difficult to lay out and not easily visualized due to a loop recorder that block imaging.  The peviously stented proximal LAD contains 30% in-stent restenosis.  The jailed diagonal is totally occluded but supplied by collaterals.  The mid LAD beyond the stent contains a 50-70 % eccentric narrowing unchanged from prior imaging.  Circumflex territory is small.  The proximal vessel contains 80% stenosis and is unchanged compared to the prior angiogram.  A large obtuse marginal is totally occluded and supplied by left to left collaterals.  Right coronary is totally occluded in the distal vessel receives right to right and left to right collaterals.  LVEDP 10 mmHg.  Ventriculography was not performed. RECOMMENDATIONS:  Compared to prior images, left main is about the same as before.  Symptoms could be related to the left main.  The patient's age, comorbidities, and unwillingness to consider coronary bypass  grafting leave few options for management.  PCI would be single remaining vessel and high risk.  Discussed with patient who prefers conservative management at this point.  Continue medical therapy.  Guarded prognosis.   DG Chest Port 1 View  Result Date: 06/04/2020 CLINICAL DATA:  Chest pain EXAM: PORTABLE CHEST 1 VIEW COMPARISON:  Chest radiograph dated 03/17/2019 and chest CT dated 03/25/2020. FINDINGS: The heart is enlarged. Vascular calcifications are seen in the aortic arch. A cardiac loop recorder overlies the left chest. A moderate right pleural effusion with associated atelectasis/airspace disease is noted. There is mild left basilar atelectasis. There is no significant left pleural effusion. There is no pneumothorax on either side. Degenerative changes are  seen in the spine. IMPRESSION: Moderate right pleural effusion with associated atelectasis/airspace disease. Cardiomegaly. Aortic Atherosclerosis (ICD10-I70.0). Electronically Signed   By: Zerita Boers M.D.   On: 06/04/2020 16:36        Scheduled Meds: . sodium chloride   Intravenous Once  . atorvastatin  80 mg Oral Daily  . Chlorhexidine Gluconate Cloth  6 each Topical Q0600  . cinacalcet  60 mg Oral Once per day on Tue Thu Sat   And  . [START ON 06/07/2020] cinacalcet  30 mg Oral Once per day on Sun Mon Wed Fri  . clopidogrel  75 mg Oral Q breakfast  . feeding supplement (NEPRO CARB STEADY)  237 mL Oral BID BM  . [START ON 06/07/2020] heparin  5,000 Units Subcutaneous Q8H  . insulin aspart  0-6 Units Subcutaneous TID WC  . insulin aspart protamine- aspart  20 Units Subcutaneous BID WC  . isosorbide dinitrate  20 mg Oral QHS  . multivitamin  1 tablet Oral QHS  . sevelamer carbonate  1,600 mg Oral TID  . sodium chloride flush  3 mL Intravenous Q12H  . sodium chloride flush  3 mL Intravenous Q12H   Continuous Infusions: . sodium chloride       LOS: 1 day    Time spent: 25mins    Kathie Dike, MD [facility]   If 7PM-7AM, please contact night-coverage [email or web address]  06/06/2020, 10:24 AM

## 2020-06-06 NOTE — Progress Notes (Signed)
Mobility Specialist - Progress Note   06/06/20 1028  Mobility  Activity Contraindicated/medical hold   Most recent Hgb today is 5.7, will f/u as able.   Pricilla Handler Mobility Specialist Mobility Specialist Phone: [phone]

## 2020-06-06 NOTE — Procedures (Signed)
   I was present at this dialysis session, have reviewed the session itself and made  appropriate changes Kelly Splinter MD [place] pager [phone]   06/06/2020, 2:26 PM

## 2020-06-06 NOTE — Progress Notes (Addendum)
[facility] Progress Note  Subjective: seen in room, cannot dc this am due to drop in Hb to the 5's. Got prbc's this am. Will get HD here this afternoon. Pt hoping he will go home tonight after HD. CT abd w/o any hematoma/ intra-abd bleeding.   Vitals:   06/06/20 0845 06/06/20 0907 06/06/20 1118 06/06/20 1212  BP: (!) 117/58 (!) 114/55 (!) 116/54 119/70  Pulse: 84 84 91 (!) 103  Resp:  16 20 16   Temp:  97.8 F (36.6 C) 97.6 F (36.4 C) 97.7 F (36.5 C)  TempSrc:  Oral Oral Oral  SpO2: 100% 100% 95% 97%  Weight:      Height:        Exam:  alert, nad   no jvd  Chest cta bilat  Cor reg no RG  Abd soft ntnd no ascites   Ext no LE edema   Alert, NF, ox3   LUE AVF+bruit   Home meds:  - plavix 75 qd/ lipitor 80 qd/ isordil 20 hs/ sl ntg prn  - renvela 800 take 2 ac tid/ sensipar take 30mg  on nonhd days and 60mg  on hd days  - norco qid prn pain  - insulin 70/30 20u qhs  - prn's/ vitamins/ supplement    CXR 5/12 - IMPRESSION: Moderate right pleural effusion with associated atelectasis/airspace disease. Cardiomegaly.    OP HD: TTS [facility], dry wt 73kg   Assessment/ Plan: 1. CAD/ chest pain / unstable angina / NSTEMI - w/ extensive hx of CAD, sp PCI 07/2018. SP LHC this am, no new intervention possible. Significant CAD. Recommended medical Rx.  2. ESRD - on HD TTS. HD today upstairs.  3. Anemia ckd - Hb here low in 7's on admission, down to 5.7 today. No intra-abd or RP bleed by CT scan this am. SP prbc's this am. Per pmd  4. MBD ckd - get records, cont vdra/ binder 5. HTN/ vol - no vol excess on exam and is just under dry wt. But has a mod large R pleural effusion, will try lowering dry wt w/ UF goal 1.5 L today w/ HD. See if will tolerate.     Rob Aashrith Eves 06/06/2020, 2:18 PM   Recent Labs  Lab 06/04/20 1650 06/05/20 0001 06/05/20 0740 06/06/20 0136  K 3.6  --   --   --   BUN 10  --   --   --   CREATININE 2.84*  --  3.88*  --    CALCIUM 8.4*  --   --   --   HGB 7.6* 7.7*  --  5.7*   Inpatient medications: . atorvastatin  80 mg Oral Daily  . Chlorhexidine Gluconate Cloth  6 each Topical Q0600  . cinacalcet  60 mg Oral Once per day on Tue Thu Sat   And  . [START ON 06/07/2020] cinacalcet  30 mg Oral Once per day on Sun Mon Wed Fri  . clopidogrel  75 mg Oral Q breakfast  . feeding supplement (NEPRO CARB STEADY)  237 mL Oral BID BM  . [START ON 06/07/2020] heparin  5,000 Units Subcutaneous Q8H  . insulin aspart  0-6 Units Subcutaneous TID WC  . insulin aspart protamine- aspart  20 Units Subcutaneous BID WC  . isosorbide dinitrate  20 mg Oral BID  . multivitamin  1 tablet Oral QHS  . sevelamer carbonate  1,600 mg Oral TID  . sodium chloride flush  3 mL Intravenous Q12H  . sodium chloride  flush  3 mL Intravenous Q12H   . sodium chloride     sodium chloride, acetaminophen, acetaminophen, HYDROcodone-acetaminophen, nitroGLYCERIN, nitroGLYCERIN, ondansetron (ZOFRAN) IV, ondansetron (ZOFRAN) IV, oxyCODONE, sodium chloride flush

## 2020-06-06 NOTE — Progress Notes (Signed)
Critical lab - Hgb 5.7 Notified Hospitalist

## 2020-06-07 DIAGNOSIS — D638 Anemia in other chronic diseases classified elsewhere: Secondary | ICD-10-CM | POA: Diagnosis not present

## 2020-06-07 DIAGNOSIS — E7800 Pure hypercholesterolemia, unspecified: Secondary | ICD-10-CM

## 2020-06-07 DIAGNOSIS — N186 End stage renal disease: Secondary | ICD-10-CM | POA: Diagnosis not present

## 2020-06-07 DIAGNOSIS — D649 Anemia, unspecified: Secondary | ICD-10-CM | POA: Diagnosis not present

## 2020-06-07 DIAGNOSIS — I214 Non-ST elevation (NSTEMI) myocardial infarction: Secondary | ICD-10-CM | POA: Diagnosis not present

## 2020-06-07 DIAGNOSIS — R079 Chest pain, unspecified: Secondary | ICD-10-CM | POA: Diagnosis not present

## 2020-06-07 LAB — TYPE AND SCREEN
ABO/RH(D): A NEG
Antibody Screen: NEGATIVE
Unit division: 0
Unit division: 0

## 2020-06-07 LAB — RENAL FUNCTION PANEL
Albumin: 2.3 g/dL — ABNORMAL LOW (ref 3.5–5.0)
Anion gap: 5 (ref 5–15)
BUN: 17 mg/dL (ref 8–23)
CO2: 33 mmol/L — ABNORMAL HIGH (ref 22–32)
Calcium: 8.4 mg/dL — ABNORMAL LOW (ref 8.9–10.3)
Chloride: 96 mmol/L — ABNORMAL LOW (ref 98–111)
Creatinine, Ser: 3.68 mg/dL — ABNORMAL HIGH (ref 0.61–1.24)
GFR, Estimated: 15 mL/min — ABNORMAL LOW (ref >60)
Glucose, Bld: 149 mg/dL — ABNORMAL HIGH (ref 70–99)
Phosphorus: 3.2 mg/dL (ref 2.5–4.6)
Potassium: 3.7 mmol/L (ref 3.5–5.1)
Sodium: 134 mmol/L — ABNORMAL LOW (ref 135–145)

## 2020-06-07 LAB — GLUCOSE, CAPILLARY
Glucose-Capillary: 163 mg/dL — ABNORMAL HIGH (ref 70–99)
Glucose-Capillary: 84 mg/dL (ref 70–99)
Glucose-Capillary: 196 mg/dL — ABNORMAL HIGH (ref 70–99)
Glucose-Capillary: 68 mg/dL — ABNORMAL LOW (ref 70–99)
Glucose-Capillary: 104 mg/dL — ABNORMAL HIGH (ref 70–99)
Glucose-Capillary: 72 mg/dL (ref 70–99)

## 2020-06-07 LAB — HEMOGLOBIN AND HEMATOCRIT, BLOOD
HCT: 27.1 % — ABNORMAL LOW (ref 39.0–52.0)
Hemoglobin: 9 g/dL — ABNORMAL LOW (ref 13.0–17.0)

## 2020-06-07 LAB — BPAM RBC
Blood Product Expiration Date: 202206042359
Blood Product Expiration Date: 202206072359
ISSUE DATE / TIME: 202205140843
ISSUE DATE / TIME: 202205141501
Unit Type and Rh: 600
Unit Type and Rh: 600

## 2020-06-07 LAB — CBC
HCT: 22.7 % — ABNORMAL LOW (ref 39.0–52.0)
Hemoglobin: 7.3 g/dL — ABNORMAL LOW (ref 13.0–17.0)
MCH: 29.3 pg (ref 26.0–34.0)
MCHC: 32.2 g/dL (ref 30.0–36.0)
MCV: 91.2 fL (ref 80.0–100.0)
Platelets: 243 10*3/uL (ref 150–400)
RBC: 2.49 MIL/uL — ABNORMAL LOW (ref 4.22–5.81)
RDW: 14.6 % (ref 11.5–15.5)
WBC: 6.1 10*3/uL (ref 4.0–10.5)
nRBC: 0 % (ref 0.0–0.2)

## 2020-06-07 MED ORDER — INSULIN GLARGINE 100 UNIT/ML ~~LOC~~ SOLN
15.0000 [IU] | Freq: Once | SUBCUTANEOUS | Status: DC
  Filled 2020-06-07: qty 0.15

## 2020-06-07 MED ORDER — DEXTROSE 50 % IV SOLN
1.0000 | Freq: Once | INTRAVENOUS | Status: DC
  Filled 2020-06-07: qty 50

## 2020-06-07 MED ORDER — HEPARIN SODIUM (PORCINE) 5000 UNIT/ML IJ SOLN: 5000.0000 [IU] | Freq: Three times a day (TID) | INTRAMUSCULAR | Status: DC

## 2020-06-07 NOTE — Progress Notes (Addendum)
[facility] Progress Note   Subjective:     Nicholas [name] was seen and examined today at bedside. Last HD on 5/14-removed 1L. Noted that patient's blood pressures dropped sys in the 80s during treatment-now ranging sys 90-100s. Currently, feeling fine-completed walking with PT this morning and tolerated well. Denies SOB, CP, ABD pain, and N/V/D.   Objective Vitals:   06/06/20 2319 06/06/20 2322 06/07/20 0319 06/07/20 0759  BP: 103/60 103/60  (!) 110/52  Pulse: 87 86  85  Resp: 16 18 16 16   Temp: 97.7 F (36.5 C)  98 F (36.7 C) 98.2 F (36.8 C)  TempSrc: Oral  Oral Oral  SpO2: 100% 100%  99%  Weight:      Height:       Physical Exam General: Appears well; no acute respiratory distress Heart: Normal S1 and S2; No murmurs, gallops, or friction rub Lungs: Clear anteriorly/diminished at bases; No wheezing, rales, or rhonchi Abdomen: Soft, non-tender, (+) bowel sounds Extremities: No edema bilateral lower extremities Dialysis Access: LUE AVF (+) Bruit/Thrill  Filed Weights   06/06/20 0700 06/06/20 1400 06/06/20 1745  Weight: 72.1 kg 72.1 kg 70.3 kg    Intake/Output Summary (Last 24 hours) at 06/07/2020 1150 Last data filed at 06/07/2020 0800 Gross per 24 hour  Intake 360 ml  Output 1000 ml  Net -640 ml    Additional Objective Labs: Basic Metabolic Panel: Recent Labs  Lab 06/04/20 1650 06/05/20 0740 06/06/20 1341 06/07/20 0203  NA 135  --  136 134*  K 3.6  --  4.3 3.7  CL 92*  --  96* 96*  CO2 28  --  27 33*  GLUCOSE 125*  --  157* 149*  BUN 10  --  43* 17  CREATININE 2.84* 3.88* 6.55* 3.68*  CALCIUM 8.4*  --  8.6* 8.4*  PHOS  --   --  3.9 3.2   Liver Function Tests: Recent Labs  Lab 06/04/20 1650 06/06/20 1341 06/07/20 0203  AST 15  --   --   ALT 15  --   --   ALKPHOS 79  --   --   BILITOT 0.5  --   --   PROT 7.7  --   --   ALBUMIN 3.3* 2.9* 2.3*   CBC: Recent Labs  Lab 06/04/20 1650 06/05/20 0001 06/06/20 0136 06/06/20 1852  06/07/20 0203 06/07/20 0646  WBC 10.0 8.2 7.5 7.2 6.1  --   NEUTROABS 8.1*  --   --   --   --   --   HGB 7.6* 7.7* 5.7* 8.8* 7.3* 9.0*  HCT 22.9* 23.7* 17.8* 26.2* 22.7* 27.1*  MCV 90.9 92.2 92.2 90.3 91.2  --   PLT 320 308 259 360 243  --    Blood Culture No results found for: SDES, SPECREQUEST, CULT, REPTSTATUS  CBG: Recent Labs  Lab 06/06/20 0756 06/06/20 1211 06/06/20 1814 06/06/20 2152 06/07/20 0756  GLUCAP 91 166* 113* 219* 163*   Iron Studies: No results for input(s): IRON, TIBC, TRANSFERRIN, FERRITIN in the last 72 hours. Lab Results  Component Value Date   INR 1.0 03/18/2019   INR 1.0 09/27/2018   INR 1.1 08/08/2018   Studies/Results: CT ABDOMEN PELVIS WO CONTRAST  Result Date: 06/06/2020 CLINICAL DATA:  Retroperitoneal hematoma suspected, pt unable to bring arms above head EXAM: CT ABDOMEN AND PELVIS WITHOUT CONTRAST TECHNIQUE: Multidetector CT imaging of the abdomen and pelvis was performed following the standard protocol without IV contrast. COMPARISON:  None. FINDINGS: Evaluation of the upper abdomen somewhat limited by patient unable to raise arms. Lower chest: Large right pleural effusion and adjacent opacities likely representing atelectasis. Small left pleural effusion. Gynecomastia. Hepatobiliary: No focal liver abnormality is seen. Gallbladder is decompressed. Pancreas: Unremarkable. No surrounding inflammatory changes. Spleen: Normal in size.  Scattered granulomas. Adrenals/Urinary Tract: Adrenal glands are unremarkable. There is a 3.3 cm mass in the superior left kidney with attenuation greater than expected for a simple cyst, previously measuring 3.0 cm no hydronephrosis or renal calculi. The right kidney is surgically absent. There is an additional smaller solid exophytic mass laterally in the left kidney measuring approximately 1.1 cm, previously 0.9 cm. Urinary bladder unremarkable. Stomach/Bowel: Stomach is within normal limits. Appendix not definitely  visualized. No evidence of bowel wall thickening, distention, or inflammatory changes. Multiple colonic diverticula. Vascular/Lymphatic: Aortic atherosclerosis. Vascular patency cannot be assessed in the absence IV contrast no enlarged abdominal or pelvic lymph nodes. Reproductive: Prostate is unremarkable. Other: No abdominopelvic ascites. No evidence of retroperitoneal hematoma. Musculoskeletal: Severe degenerative changes in the bilateral hips. Lower lumbar facet arthrosis. IMPRESSION: 1. No evidence of retroperitoneal hematoma or other acute intra-abdominal process on a noncontrast scan. 2. Large right pleural effusion and adjacent opacities likely representing atelectasis. Small left pleural effusion. 3. There is a 3.3 cm mass in the superior left kidney with attenuation greater than expected for a simple cyst, previously measuring 3.0 cm. There is an additional smaller solid exophytic mass laterally in the left kidney measuring approximately 1.1 cm, previously 0.9 cm. Recommend nonemergent CT or MRI renal protocol or further characterization. 4. Aortic atherosclerosis. Aortic Atherosclerosis (ICD10-I70.0). Electronically Signed   By: Audie Pinto M.D.   On: 06/06/2020 12:29    Medications: . sodium chloride     . atorvastatin  80 mg Oral Daily  . Chlorhexidine Gluconate Cloth  6 each Topical Q0600  . cinacalcet  60 mg Oral Once per day on Tue Thu Sat   And  . cinacalcet  30 mg Oral Once per day on Sun Mon Wed Fri  . clopidogrel  75 mg Oral Q breakfast  . feeding supplement (NEPRO CARB STEADY)  237 mL Oral BID BM  . [START ON 06/08/2020] heparin  5,000 Units Subcutaneous Q8H  . insulin aspart  0-5 Units Subcutaneous QHS  . insulin aspart  0-6 Units Subcutaneous TID WC  . insulin aspart protamine- aspart  20 Units Subcutaneous BID WC  . isosorbide dinitrate  20 mg Oral BID  . multivitamin  1 tablet Oral QHS  . sevelamer carbonate  1,600 mg Oral TID  . sodium chloride flush  3 mL  Intravenous Q12H  . sodium chloride flush  3 mL Intravenous Q12H    Dialysis Orders: TTS [facility], dry wt 73kg  Assessment/Plan: 1. CAD/ chest pain / unstable angina / NSTEMI - w/ extensive hx of CAD, sp PCI 07/2018. SP LHC this am, no new intervention possible. Significant CAD. Recommended medical Rx.  2. ESRD - on HD TTS. Last HD 5/14-Attempted UF 1.5L d/t mod/large R pleural effusion but only removed 1L d/t blood pressures dropping in 80s during yesterday's treatment. Bps now ranging sys 90-100s-can give small 500cc bolus if needed today-will monitor 3. Anemia ckd - Hb here low in 7's on admission-down to 5.7 on 5/14-received 2 units PRBCs on 5/14-Hgb now 9.0. CT ABD completed-no intra-abd or RP bleed. Managed by primary. 4. MBD ckd - CorrCa and PO4 ok-cont vdra/ binder. 5.    HTN/ vol -  Patient euvolemic on exam-no signs of volume overload.  6.    Dispo: Spoke with Dr. Nunzio Cobbs for possible discharge today  Tobie Poet, NP [place] 06/07/2020,11:50 AM  LOS: 2 days   Pt seen, examined and agree w A/P as above. OK for dc from renal standpoint.  Kelly Splinter  MD 06/07/2020, 5:34 PM

## 2020-06-07 NOTE — Progress Notes (Signed)
Hypoglycemic Event  CBG: 68   Treatment: 4 oz juice/soda  Symptoms: None  Follow-up CBG: Time:2145 CBG Result:72  Possible Reasons for Event: Unknown  Comments/MD notified: notified via page    Cheri Kearns

## 2020-06-07 NOTE — Progress Notes (Addendum)
Progress Note  Patient Name: Nicholas [name] Date of Encounter: 06/07/2020  Primary Cardiologist: Dr. Daneen Schick, MD  Subjective    Denies any chest pain or dyspnea  Inpatient Medications    Scheduled Meds: . atorvastatin  80 mg Oral Daily  . Chlorhexidine Gluconate Cloth  6 each Topical Q0600  . cinacalcet  60 mg Oral Once per day on Tue Thu Sat   And  . cinacalcet  30 mg Oral Once per day on Sun Mon Wed Fri  . clopidogrel  75 mg Oral Q breakfast  . feeding supplement (NEPRO CARB STEADY)  237 mL Oral BID BM  . [START ON 06/08/2020] heparin  5,000 Units Subcutaneous Q8H  . insulin aspart  0-5 Units Subcutaneous QHS  . insulin aspart  0-6 Units Subcutaneous TID WC  . insulin aspart protamine- aspart  20 Units Subcutaneous BID WC  . isosorbide dinitrate  20 mg Oral BID  . multivitamin  1 tablet Oral QHS  . sevelamer carbonate  1,600 mg Oral TID  . sodium chloride flush  3 mL Intravenous Q12H  . sodium chloride flush  3 mL Intravenous Q12H   Continuous Infusions: . sodium chloride     PRN Meds: sodium chloride, acetaminophen, acetaminophen, HYDROcodone-acetaminophen, nitroGLYCERIN, nitroGLYCERIN, ondansetron (ZOFRAN) IV, ondansetron (ZOFRAN) IV, oxyCODONE, sodium chloride flush   Vital Signs    Vitals:   06/06/20 2319 06/06/20 2322 06/07/20 0319 06/07/20 0759  BP: 103/60 103/60  (!) 110/52  Pulse: 87 86  85  Resp: 16 18 16 16   Temp: 97.7 F (36.5 C)  98 F (36.7 C) 98.2 F (36.8 C)  TempSrc: Oral  Oral Oral  SpO2: 100% 100%  99%  Weight:      Height:        Intake/Output Summary (Last 24 hours) at 06/07/2020 0802 Last data filed at 06/07/2020 0800 Gross per 24 hour  Intake 652 ml  Output 1000 ml  Net -348 ml   Filed Weights   06/06/20 0700 06/06/20 1400 06/06/20 1745  Weight: 72.1 kg 72.1 kg 70.3 kg    Physical Exam   GEN: Well nourished, well developed, in no acute distress HEENT: normal Neck: no JVD Cardiac: RRR; 2/6 systolic  murmur Respiratory:  clear to auscultation bilaterally, normal work of breathing GI: soft, nontender, nondistended MS: no deformity or atrophy.  RFA access site with no hematoma Skin: warm and dry, no rash Neuro:  Alert and Oriented x 3 Psych: normal affect   Labs    Chemistry Recent Labs  Lab 06/04/20 1650 06/05/20 0740 06/06/20 1341 06/07/20 0203  NA 135  --  136 134*  K 3.6  --  4.3 3.7  CL 92*  --  96* 96*  CO2 28  --  27 33*  GLUCOSE 125*  --  157* 149*  BUN 10  --  43* 17  CREATININE 2.84* 3.88* 6.55* 3.68*  CALCIUM 8.4*  --  8.6* 8.4*  PROT 7.7  --   --   --   ALBUMIN 3.3*  --  2.9* 2.3*  AST 15  --   --   --   ALT 15  --   --   --   ALKPHOS 79  --   --   --   BILITOT 0.5  --   --   --   GFRNONAA 21* 14* 8* 15*  ANIONGAP 15  --  13 5     Hematology Recent Labs  Lab 06/06/20  0136 06/06/20 1852 06/07/20 0203 06/07/20 0646  WBC 7.5 7.2 6.1  --   RBC 1.93* 2.90* 2.49*  --   HGB 5.7* 8.8* 7.3* 9.0*  HCT 17.8* 26.2* 22.7* 27.1*  MCV 92.2 90.3 91.2  --   MCH 29.5 30.3 29.3  --   MCHC 32.0 33.6 32.2  --   RDW 14.9 14.5 14.6  --   PLT 259 360 243  --     Cardiac EnzymesNo results for input(s): TROPONINI in the last 168 hours. No results for input(s): TROPIPOC in the last 168 hours.   BNP Recent Labs  Lab 06/04/20 1650  BNP 268.2*     DDimer No results for input(s): DDIMER in the last 168 hours.   Radiology    CT ABDOMEN PELVIS WO CONTRAST  Result Date: 06/06/2020 CLINICAL DATA:  Retroperitoneal hematoma suspected, pt unable to bring arms above head EXAM: CT ABDOMEN AND PELVIS WITHOUT CONTRAST TECHNIQUE: Multidetector CT imaging of the abdomen and pelvis was performed following the standard protocol without IV contrast. COMPARISON:  None. FINDINGS: Evaluation of the upper abdomen somewhat limited by patient unable to raise arms. Lower chest: Large right pleural effusion and adjacent opacities likely representing atelectasis. Small left pleural  effusion. Gynecomastia. Hepatobiliary: No focal liver abnormality is seen. Gallbladder is decompressed. Pancreas: Unremarkable. No surrounding inflammatory changes. Spleen: Normal in size.  Scattered granulomas. Adrenals/Urinary Tract: Adrenal glands are unremarkable. There is a 3.3 cm mass in the superior left kidney with attenuation greater than expected for a simple cyst, previously measuring 3.0 cm no hydronephrosis or renal calculi. The right kidney is surgically absent. There is an additional smaller solid exophytic mass laterally in the left kidney measuring approximately 1.1 cm, previously 0.9 cm. Urinary bladder unremarkable. Stomach/Bowel: Stomach is within normal limits. Appendix not definitely visualized. No evidence of bowel wall thickening, distention, or inflammatory changes. Multiple colonic diverticula. Vascular/Lymphatic: Aortic atherosclerosis. Vascular patency cannot be assessed in the absence IV contrast no enlarged abdominal or pelvic lymph nodes. Reproductive: Prostate is unremarkable. Other: No abdominopelvic ascites. No evidence of retroperitoneal hematoma. Musculoskeletal: Severe degenerative changes in the bilateral hips. Lower lumbar facet arthrosis. IMPRESSION: 1. No evidence of retroperitoneal hematoma or other acute intra-abdominal process on a noncontrast scan. 2. Large right pleural effusion and adjacent opacities likely representing atelectasis. Small left pleural effusion. 3. There is a 3.3 cm mass in the superior left kidney with attenuation greater than expected for a simple cyst, previously measuring 3.0 cm. There is an additional smaller solid exophytic mass laterally in the left kidney measuring approximately 1.1 cm, previously 0.9 cm. Recommend nonemergent CT or MRI renal protocol or further characterization. 4. Aortic atherosclerosis. Aortic Atherosclerosis (ICD10-I70.0). Electronically Signed   By: Audie Pinto M.D.   On: 06/06/2020 12:29   CARDIAC  CATHETERIZATION  Result Date: 06/05/2020  Mid LAD lesion is 60% stenosed.   40 to 60% eccentric distal left main not greatly different than on prior angiography.  Left main is difficult to lay out and not easily visualized due to a loop recorder that block imaging.  The peviously stented proximal LAD contains 30% in-stent restenosis.  The jailed diagonal is totally occluded but supplied by collaterals.  The mid LAD beyond the stent contains a 50-70 % eccentric narrowing unchanged from prior imaging.  Circumflex territory is small.  The proximal vessel contains 80% stenosis and is unchanged compared to the prior angiogram.  A large obtuse marginal is totally occluded and supplied by left to left collaterals.  Right coronary is totally occluded in the distal vessel receives right to right and left to right collaterals.  LVEDP 10 mmHg.  Ventriculography was not performed. RECOMMENDATIONS:  Compared to prior images, left main is about the same as before.  Symptoms could be related to the left main.  The patient's age, comorbidities, and unwillingness to consider coronary bypass grafting leave few options for management.  PCI would be single remaining vessel and high risk.  Discussed with patient who prefers conservative management at this point.  Continue medical therapy.  Guarded prognosis.   Telemetry    06/05/20 NSR with rates in the 80's - Personally Reviewed  ECG    No new tracing as of 06/05/20- Personally Reviewed  Cardiac Studies   Echocardiogram 03/18/2019:   1. Left ventricular ejection fraction, by estimation, is 60 to 65%. The  left ventricle has normal function. The left ventricle has no regional  wall motion abnormalities. Left ventricular diastolic parameters are  consistent with Grade I diastolic  dysfunction (impaired relaxation). Elevated left ventricular end-diastolic  pressure.   2. Right ventricular systolic function is normal. The right ventricular  size is normal.  There is normal pulmonary artery systolic pressure.   3. The mitral valve is normal in structure and function. No evidence of  mitral valve regurgitation. No evidence of mitral stenosis.   4. The aortic valve is tricuspid. Aortic valve regurgitation is mild.  Mild aortic valve sclerosis is present, with no evidence of aortic valve  stenosis.   5. The inferior vena cava is normal in size with greater than 50%  respiratory variability, suggesting right atrial pressure of 3 mmHg.    LHC 08/08/2018:   Prox LAD lesion is 90% stenosed.  1st Diag lesion is 80% stenosed.  Mid LM to Dist LM lesion is 60% stenosed.  1st Mrg lesion is 100% stenosed.  Ost RCA to Prox RCA lesion is 99% stenosed.  Mid RCA to Dist RCA lesion is 100% stenosed.  Prox Cx lesion is 80% stenosed.   Severe multivessel CAD with coronary and aortic calcification.  There is focal 60% distal left main stenosis; 90% proximal LAD stenosis at the origin of the first diagonal vessel with 80% ostial diagonal stenosis; 80% proximal circumflex stenosis with total occlusion of the circumflex marginal vessel with collateralization from the LAD to the marginal vessel; and total RCA occlusion with left to right collateralization to the PDA and PL branches.   LVEDP 19 mmHg.   RECOMMENDATION: Angiographic findings will be reviewed with Dr. Tamala Julian.  Consider surgical consultation if patient is a CABG candidate.   Diagnostic Dominance: Right    Stent intervention 08/09/2018:       Patient Profile     87 y.o. male with CAD s/p PCI (07/2018, pLAD), ESRD/iHD (T/R/Sa), HLD, and HTN who presented to [facility] ED with substernal chest pressure since 05/12 AM.   Assessment & Plan    Chest pain with history of CAD s/p pLAD PCI 07/2018: -Patient presented with chest pain which began 06/04/2020 at which time he called for an appointment with Dr. Tamala Julian who recommended ED evaluation given his history.  Apparently, he has been having  consistent exertional angina relieved with rest for the last year.  More recently, prior to HD on Thursday he began having acute onset of centralized nonradiating chest pain relieved with nitroglycerin with several subsequent episodes also relieved with nitroglycerin. -HST, 25>> 32>> 40 -Last intervention with NSTEMI 07/2018 treated pLAD with orbital arthrectomy and stenting -  Last echocardiogram 03/18/2019 with LVEF at 60 to 65% with G1 DD and no evidence of valvular disease -On PTA Plavix, does not appear to be on ASA and high intensity atorvastatin -Cath yesterday showed 40 to 60% distal left main stenosis, 30% in-stent restenosis of proximal LAD, occluded diagonal supplied by collaterals, 50 to 70% mid LAD stenosis, proximal LCx 80%, occluded OM, occluded RCA.  Conservative management recommended.  Increased Isordil to 20 mg twice daily.  Reports no current anginal symptoms  Acute anemia: Hemoglobin down to 5.7 on 5/14.  2 units PRBCs ordered.  Right femoral access for cath on 5/13, site looks good, no evidence of bleeding.  CT abdomen pelvis showed no evidence of bleed.  Hemoglobin improved to 7.3 this morning   ESRD: -HD TTHS -Follows with nephrology -BNP on ED arrival 268>> fluid volume managed with HD  HTN: -Not currently on any antihypertensives  HLD: -LDL 122 on 06/05/20, reports compliance with high intensity atorvastatin.  Will need lipid clinic referral for possible PCSK9 inhibitor  DM2: -SSI for glucose control while inpatient -Per primary team   Donato Heinz, MD

## 2020-06-07 NOTE — Progress Notes (Signed)
PROGRESS NOTE    Nicholas [name]  VEH:[id number] DOB: 1930-04-06 DOA: 06/04/2020 PCP: [facility]    Brief Narrative:  87 y/o male with known CAD, ESRD, DM, admitted to the hospital with chest pain. He underwent cardia cath on 5/13 which did not show any significant change in disease from prior cath. Recommendations were for medical management. Post cath, patient developed acute anemia with hemoglobin of 5.7. He is being transfused PRBC. Nephrology following for HD needs.   Assessment & Plan:   Principal Problem:   NSTEMI (non-ST elevated myocardial infarction) ([place]) Active Problems:   Diabetes mellitus ([place])   Hypertension   Hypercholesteremia   ESRD (end stage renal disease) on dialysis ([place])   Anemia of chronic disease   Type II diabetes mellitus with renal manifestations ([facility])   GERD (gastroesophageal reflux disease)   CAD (coronary artery disease)   Chronic systolic CHF (congestive heart failure) ([facility])   Acute anemia   Chest pain   Left renal mass   Unstable Angina -s/p cardiac cath on 5/13 -results did not indicate significant change from prior cath, and there did appear to be any options for PCI -recommendations were for medical management -currently on plavix, nitrates, statin -cardiology following  Right pleural effusion -mod pleural effusion noted on chest xray -patient is hypoxic and short of breath on ambulation -will request thoracentesis in AM  Acute on chronic anemia -patient does have anemia of chronic kidney disease -baseline hemoglobin appears to be around 7-8 -hemoglobin dropped to 5.7 this morning -he does not have any obvious bleeding -his cath site does not show a large hematoma and he has not had any evidence of GI bleeding -CT abdomen negative for RP bleed -he received 1 unit prbc with improvement of hemoglobin -continue to follow  ESRD on HD -nephrology following -patient will undergo dialysis today  Diabetes Mellitus, type 2,  insulin dependent -currently on home dose of 70/30 -supplement with sliding scale -A1C of 7.9 -blood sugars stable  HLD -continue statin  Left renal mass -s/p right nephrectomy due to malignancy -noted to have 3.3cm mass in superior left kidney and another mass of 1.1cm.  -both masses were present on prior imaging -patient plans to follow up with urology at the [facility] for further work up     DVT prophylaxis: heparin injection 5,000 Units Start: 06/08/20 0600 SCDs Start: 06/05/20 8366  Code Status: full code Family Communication: discussed with patient, no family at bedside Disposition Plan: Status is: Inpatient  Remains inpatient appropriate because:Ongoing diagnostic testing needed not appropriate for outpatient work up, IV treatments appropriate due to intensity of illness or inability to take PO and Inpatient level of care appropriate due to severity of illness   Dispo: The patient is from: Home              Anticipated d/c is to: Home              Patient currently is not medically stable to d/c.   Difficult to place patient No         Consultants:   Cardiology  nephrology  Procedures:   5/13 Cardiac cath  Antimicrobials:      Subjective: Patient ambulated today on room air and became hypoxic and short of breath  Objective: Vitals:   06/07/20 0759 06/07/20 1113 06/07/20 1600 06/07/20 1630  BP: (!) 110/52 (!) 100/50 110/80   Pulse: 85 79 81 81  Resp: 16   18  Temp: 98.2 F (36.8 C)  97.9 F (36.6 C)  TempSrc: Oral   Oral  SpO2: 99% 100% (!) 87% (!) 89%  Weight:      Height:        Intake/Output Summary (Last 24 hours) at 06/07/2020 1736 Last data filed at 06/07/2020 0800 Gross per 24 hour  Intake 240 ml  Output 1000 ml  Net -760 ml   Filed Weights   06/06/20 0700 06/06/20 1400 06/06/20 1745  Weight: 72.1 kg 72.1 kg 70.3 kg    Examination:  General exam: Appears calm and comfortable  Respiratory system: diminished breath sounds at  bases. Respiratory effort normal. Cardiovascular system: S1 & S2 heard, RRR. No JVD, murmurs, rubs, gallops or clicks.  Gastrointestinal system: Abdomen is nondistended, soft and nontender. No organomegaly or masses felt. Normal bowel sounds heard. Central nervous system: Alert and oriented. No focal neurological deficits. Extremities: Symmetric 5 x 5 power. Right groin site looks clean, no bruising or hematoma appreciated Skin: No rashes, lesions or ulcers Psychiatry: Judgement and insight appear normal. Mood & affect appropriate.     Data Reviewed: I have personally reviewed following labs and imaging studies  CBC: Recent Labs  Lab 06/04/20 1650 06/05/20 0001 06/06/20 0136 06/06/20 1852 06/07/20 0203 06/07/20 0646  WBC 10.0 8.2 7.5 7.2 6.1  --   NEUTROABS 8.1*  --   --   --   --   --   HGB 7.6* 7.7* 5.7* 8.8* 7.3* 9.0*  HCT 22.9* 23.7* 17.8* 26.2* 22.7* 27.1*  MCV 90.9 92.2 92.2 90.3 91.2  --   PLT 320 308 259 360 243  --    Basic Metabolic Panel: Recent Labs  Lab 06/04/20 1650 06/05/20 0740 06/06/20 1341 06/07/20 0203  NA 135  --  136 134*  K 3.6  --  4.3 3.7  CL 92*  --  96* 96*  CO2 28  --  27 33*  GLUCOSE 125*  --  157* 149*  BUN 10  --  43* 17  CREATININE 2.84* 3.88* 6.55* 3.68*  CALCIUM 8.4*  --  8.6* 8.4*  PHOS  --   --  3.9 3.2   GFR: Estimated Creatinine Clearance: 13.7 mL/min (A) (by C-G formula based on SCr of 3.68 mg/dL (H)). Liver Function Tests: Recent Labs  Lab 06/04/20 1650 06/06/20 1341 06/07/20 0203  AST 15  --   --   ALT 15  --   --   ALKPHOS 79  --   --   BILITOT 0.5  --   --   PROT 7.7  --   --   ALBUMIN 3.3* 2.9* 2.3*   No results for input(s): LIPASE, AMYLASE in the last 168 hours. No results for input(s): AMMONIA in the last 168 hours. Coagulation Profile: No results for input(s): INR, PROTIME in the last 168 hours. Cardiac Enzymes: No results for input(s): CKTOTAL, CKMB, CKMBINDEX, TROPONINI in the last 168 hours. BNP (last  3 results) No results for input(s): PROBNP in the last 8760 hours. HbA1C: Recent Labs    06/05/20 0001  HGBA1C 7.9*   CBG: Recent Labs  Lab 06/06/20 1814 06/06/20 2152 06/07/20 0756 06/07/20 1153 06/07/20 1628  GLUCAP 113* 219* 163* 84 196*   Lipid Profile: Recent Labs    06/05/20 0740  CHOL 198  HDL 62  LDLCALC 122*  TRIG 71  CHOLHDL 3.2   Thyroid Function Tests: No results for input(s): TSH, T4TOTAL, FREET4, T3FREE, THYROIDAB in the last 72 hours. Anemia Panel: No results for input(s):  VITAMINB12, FOLATE, FERRITIN, TIBC, IRON, RETICCTPCT in the last 72 hours. Sepsis Labs: No results for input(s): PROCALCITON, LATICACIDVEN in the last 168 hours.  Recent Results (from the past 240 hour(s))  Resp Panel by RT-PCR (Flu A&B, Covid) Nasopharyngeal Swab     Status: None   Collection Time: 06/04/20  5:12 PM   Specimen: Nasopharyngeal Swab; Nasopharyngeal(NP) swabs in vial transport medium  Result Value Ref Range Status   SARS Coronavirus 2 by RT PCR NEGATIVE NEGATIVE Final    Comment: (NOTE) SARS-CoV-2 target nucleic acids are NOT DETECTED.  The SARS-CoV-2 RNA is generally detectable in upper respiratory specimens during the acute phase of infection. The lowest concentration of SARS-CoV-2 viral copies this assay can detect is 138 copies/mL. A negative result does not preclude SARS-Cov-2 infection and should not be used as the sole basis for treatment or other patient management decisions. A negative result may occur with  improper specimen collection/handling, submission of specimen other than nasopharyngeal swab, presence of viral mutation(s) within the areas targeted by this assay, and inadequate number of viral copies(<138 copies/mL). A negative result must be combined with clinical observations, patient history, and epidemiological information. The expected result is Negative.  Fact Sheet for Patients:  EntrepreneurPulse.com.au  Fact Sheet for  Healthcare Providers:  IncredibleEmployment.be  This test is no t yet approved or cleared by the [place] FDA and  has been authorized for detection and/or diagnosis of SARS-CoV-2 by FDA under an Emergency Use Authorization (EUA). This EUA will remain  in effect (meaning this test can be used) for the duration of the COVID-19 declaration under Section 564(b)(1) of the Act, 21 U.S.C.section 360bbb-3(b)(1), unless the authorization is terminated  or revoked sooner.       Influenza A by PCR NEGATIVE NEGATIVE Final   Influenza B by PCR NEGATIVE NEGATIVE Final    Comment: (NOTE) The Xpert Xpress SARS-CoV-2/FLU/RSV plus assay is intended as an aid in the diagnosis of influenza from Nasopharyngeal swab specimens and should not be used as a sole basis for treatment. Nasal washings and aspirates are unacceptable for Xpert Xpress SARS-CoV-2/FLU/RSV testing.  Fact Sheet for Patients: EntrepreneurPulse.com.au  Fact Sheet for Healthcare Providers: IncredibleEmployment.be  This test is not yet approved or cleared by the [place] FDA and has been authorized for detection and/or diagnosis of SARS-CoV-2 by FDA under an Emergency Use Authorization (EUA). This EUA will remain in effect (meaning this test can be used) for the duration of the COVID-19 declaration under Section 564(b)(1) of the Act, 21 U.S.C. section 360bbb-3(b)(1), unless the authorization is terminated or revoked.  Performed at [facility], [address] [place], [place] [address]          Radiology Studies: CT ABDOMEN PELVIS WO CONTRAST  Result Date: 06/06/2020 CLINICAL DATA:  Retroperitoneal hematoma suspected, pt unable to bring arms above head EXAM: CT ABDOMEN AND PELVIS WITHOUT CONTRAST TECHNIQUE: Multidetector CT imaging of the abdomen and pelvis was performed following the standard protocol without IV contrast. COMPARISON:  None. FINDINGS:  Evaluation of the upper abdomen somewhat limited by patient unable to raise arms. Lower chest: Large right pleural effusion and adjacent opacities likely representing atelectasis. Small left pleural effusion. Gynecomastia. Hepatobiliary: No focal liver abnormality is seen. Gallbladder is decompressed. Pancreas: Unremarkable. No surrounding inflammatory changes. Spleen: Normal in size.  Scattered granulomas. Adrenals/Urinary Tract: Adrenal glands are unremarkable. There is a 3.3 cm mass in the superior left kidney with attenuation greater than expected for a simple cyst,  previously measuring 3.0 cm no hydronephrosis or renal calculi. The right kidney is surgically absent. There is an additional smaller solid exophytic mass laterally in the left kidney measuring approximately 1.1 cm, previously 0.9 cm. Urinary bladder unremarkable. Stomach/Bowel: Stomach is within normal limits. Appendix not definitely visualized. No evidence of bowel wall thickening, distention, or inflammatory changes. Multiple colonic diverticula. Vascular/Lymphatic: Aortic atherosclerosis. Vascular patency cannot be assessed in the absence IV contrast no enlarged abdominal or pelvic lymph nodes. Reproductive: Prostate is unremarkable. Other: No abdominopelvic ascites. No evidence of retroperitoneal hematoma. Musculoskeletal: Severe degenerative changes in the bilateral hips. Lower lumbar facet arthrosis. IMPRESSION: 1. No evidence of retroperitoneal hematoma or other acute intra-abdominal process on a noncontrast scan. 2. Large right pleural effusion and adjacent opacities likely representing atelectasis. Small left pleural effusion. 3. There is a 3.3 cm mass in the superior left kidney with attenuation greater than expected for a simple cyst, previously measuring 3.0 cm. There is an additional smaller solid exophytic mass laterally in the left kidney measuring approximately 1.1 cm, previously 0.9 cm. Recommend nonemergent CT or MRI renal protocol  or further characterization. 4. Aortic atherosclerosis. Aortic Atherosclerosis (ICD10-I70.0). Electronically Signed   By: Audie Pinto M.D.   On: 06/06/2020 12:29        Scheduled Meds: . atorvastatin  80 mg Oral Daily  . Chlorhexidine Gluconate Cloth  6 each Topical Q0600  . cinacalcet  60 mg Oral Once per day on Tue Thu Sat   And  . cinacalcet  30 mg Oral Once per day on Sun Mon Wed Fri  . clopidogrel  75 mg Oral Q breakfast  . feeding supplement (NEPRO CARB STEADY)  237 mL Oral BID BM  . [START ON 06/08/2020] heparin  5,000 Units Subcutaneous Q8H  . insulin aspart  0-5 Units Subcutaneous QHS  . insulin aspart  0-6 Units Subcutaneous TID WC  . insulin aspart protamine- aspart  20 Units Subcutaneous BID WC  . isosorbide dinitrate  20 mg Oral BID  . multivitamin  1 tablet Oral QHS  . sevelamer carbonate  1,600 mg Oral TID  . sodium chloride flush  3 mL Intravenous Q12H  . sodium chloride flush  3 mL Intravenous Q12H   Continuous Infusions: . sodium chloride       LOS: 2 days    Time spent: 44mins    Kathie Dike, MD [facility]   If 7PM-7AM, please contact night-coverage [email or web address]  06/07/2020, 5:36 PM

## 2020-06-07 NOTE — Progress Notes (Signed)
Mobility Specialist - Progress Note   06/07/20 1132  Mobility  Activity Ambulated in hall  Level of Assistance Standby assist, set-up cues, supervision of patient - no hands on  Assistive Device Front wheel walker  Distance Ambulated (ft) 140 ft  Mobility Ambulated with assistance in hallway  Mobility Response Tolerated well  Mobility performed by Mobility specialist  $Mobility charge 1 Mobility   Pre-mobility: 79 HR, 100/50 BP, 100% SpO2 During mobility: 90 HR, 96% SpO2 Post-mobility: 85 HR, 97/58 BP, 100% SpO2  Pt on 3L O2 throughout. He denied any dizziness, lightheadedness, or SOB w/ ambulation. Pt sitting up on edge after walk, call bell at side.   Pricilla Handler Mobility Specialist Mobility Specialist Phone: [phone]

## 2020-06-07 NOTE — Progress Notes (Incomplete)
SATURATION QUALIFICATIONS: (This note is used to comply with regulatory documentation for home oxygen)  Patient Saturations on Room Air at Rest = 86%  Patient Saturations on Room Air while Ambulating = 80%  Patient Saturations on 2 Liters of oxygen while Ambulating = %  Please briefly explain why patient needs home oxygen:  Pt desatting into 80s at rest.

## 2020-06-08 ENCOUNTER — Inpatient Hospital Stay (HOSPITAL_COMMUNITY): Payer: Medicare Other

## 2020-06-08 ENCOUNTER — Encounter (HOSPITAL_COMMUNITY): Payer: Self-pay | Admitting: Interventional Cardiology

## 2020-06-08 DIAGNOSIS — I5022 Chronic systolic (congestive) heart failure: Secondary | ICD-10-CM | POA: Diagnosis not present

## 2020-06-08 DIAGNOSIS — D649 Anemia, unspecified: Secondary | ICD-10-CM | POA: Diagnosis not present

## 2020-06-08 DIAGNOSIS — E0800 Diabetes mellitus due to underlying condition with hyperosmolarity without nonketotic hyperglycemic-hyperosmolar coma (NKHHC): Secondary | ICD-10-CM

## 2020-06-08 DIAGNOSIS — I214 Non-ST elevation (NSTEMI) myocardial infarction: Secondary | ICD-10-CM | POA: Diagnosis not present

## 2020-06-08 DIAGNOSIS — D638 Anemia in other chronic diseases classified elsewhere: Secondary | ICD-10-CM | POA: Diagnosis not present

## 2020-06-08 HISTORY — PX: IR THORACENTESIS ASP PLEURAL SPACE W/IMG GUIDE: IMG5380

## 2020-06-08 LAB — CBC
HCT: 23.1 % — ABNORMAL LOW (ref 39.0–52.0)
Hemoglobin: 7.6 g/dL — ABNORMAL LOW (ref 13.0–17.0)
MCH: 30.4 pg (ref 26.0–34.0)
MCHC: 32.9 g/dL (ref 30.0–36.0)
MCV: 92.4 fL (ref 80.0–100.0)
Platelets: 247 10*3/uL (ref 150–400)
RBC: 2.5 MIL/uL — ABNORMAL LOW (ref 4.22–5.81)
RDW: 14.6 % (ref 11.5–15.5)
WBC: 7.3 10*3/uL (ref 4.0–10.5)
nRBC: 0 % (ref 0.0–0.2)

## 2020-06-08 LAB — BODY FLUID CELL COUNT WITH DIFFERENTIAL
Eos, Fluid: 2 %
Lymphs, Fluid: 73 %
Monocyte-Macrophage-Serous Fluid: 10 % — ABNORMAL LOW (ref 50–90)
Neutrophil Count, Fluid: 15 % (ref 0–25)
Total Nucleated Cell Count, Fluid: 24 cu mm (ref 0–1000)

## 2020-06-08 LAB — GLUCOSE, CAPILLARY
Glucose-Capillary: 122 mg/dL — ABNORMAL HIGH (ref 70–99)
Glucose-Capillary: 209 mg/dL — ABNORMAL HIGH (ref 70–99)

## 2020-06-08 LAB — PROTEIN, PLEURAL OR PERITONEAL FLUID: Total protein, fluid: 3.5 g/dL

## 2020-06-08 LAB — HEMOGLOBIN AND HEMATOCRIT, BLOOD
HCT: 24.8 % — ABNORMAL LOW (ref 39.0–52.0)
Hemoglobin: 8 g/dL — ABNORMAL LOW (ref 13.0–17.0)

## 2020-06-08 IMAGING — US IR THORACENTESIS ASP PLEURAL SPACE W/IMG GUIDE
1 series · 3 of 3 positions shown · non-contrast
Comparison: none

INDICATION: Patient with history of HF, CAD, NSTEMI, ESRD on HD, dyspnea, and
right pleural effusion. Request is made for diagnostic and
therapeutic right thoracentesis.

[Series 1: ir (id) (id)/(id)/(id) ir · 3 of 3 slices shown]
[im 1/3]
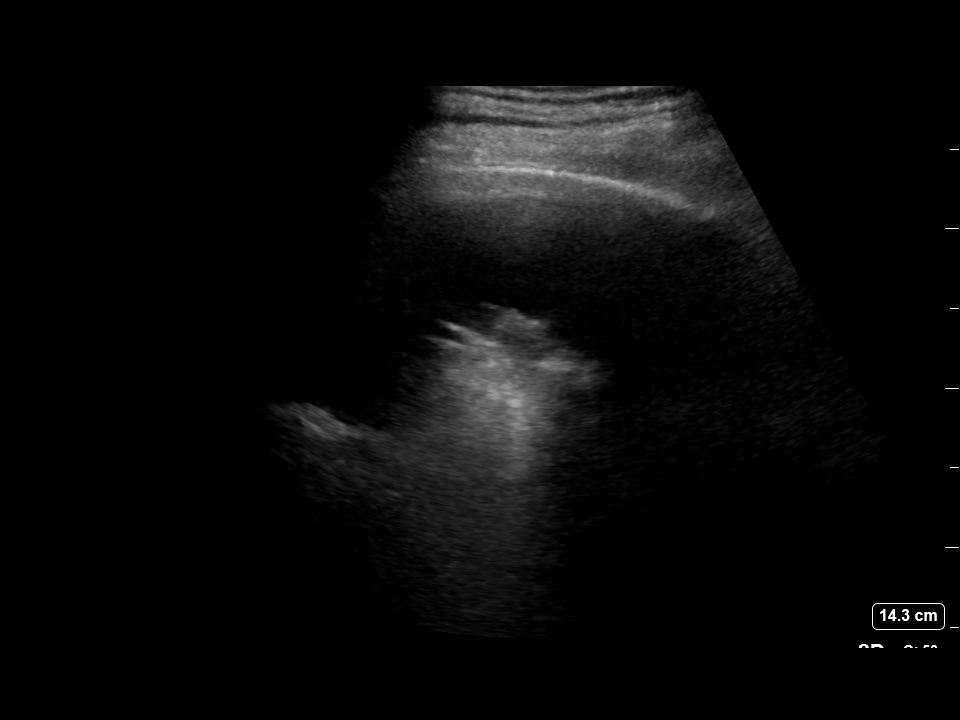
[im 2/3]
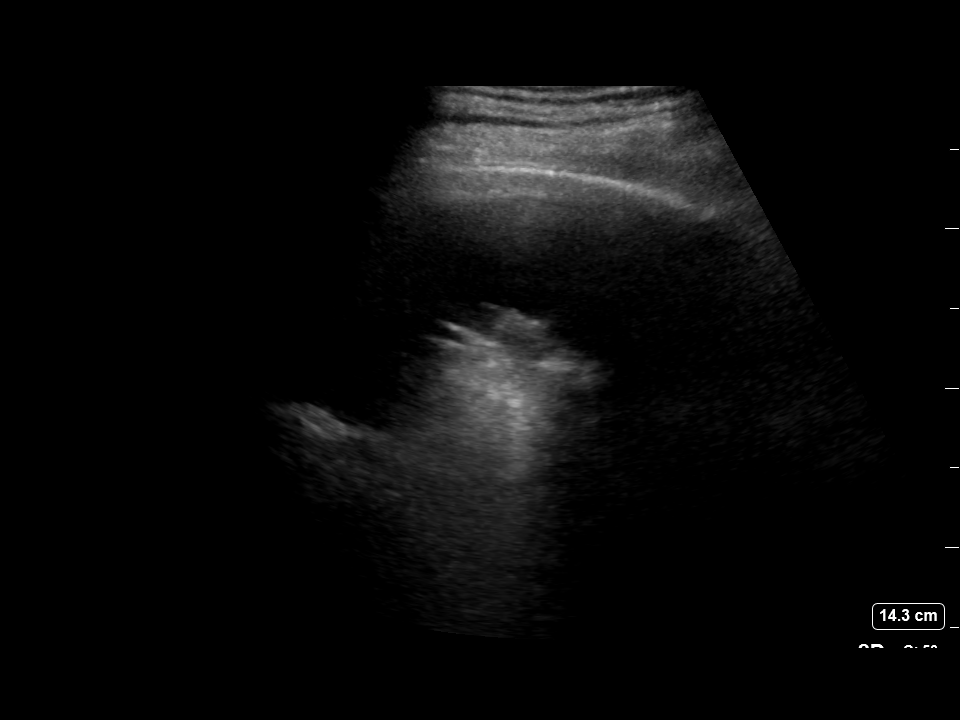
[im 3/3]
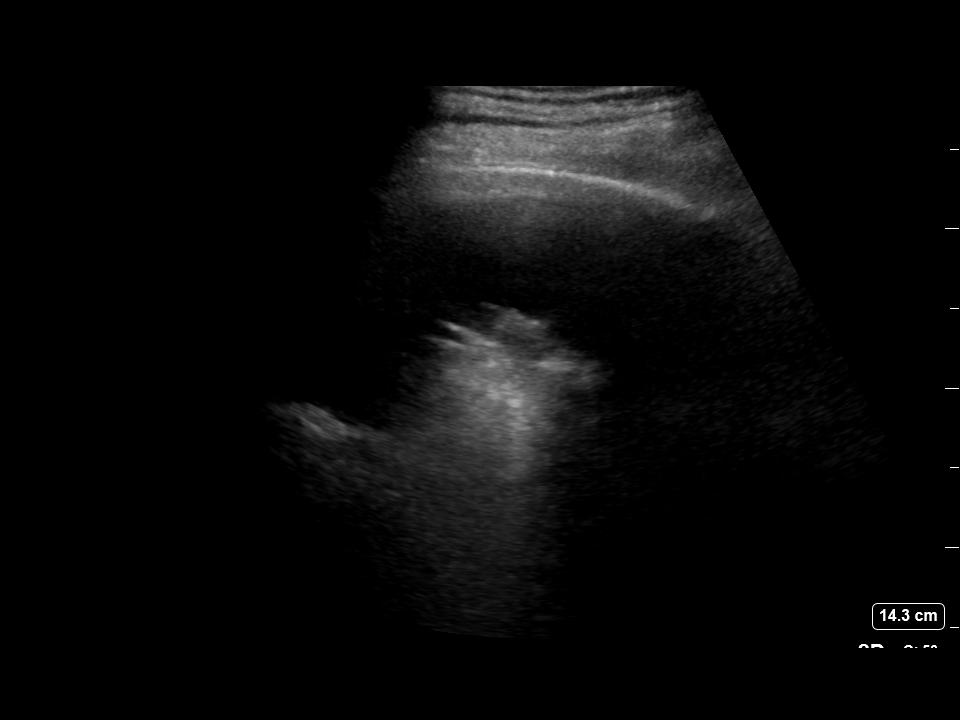

[3 of 3 positions shown; findings below may reference images not displayed]

EXAM:
ULTRASOUND GUIDED DIAGNOSTIC AND THERAPEUTIC RIGHT THORACENTESIS

MEDICATIONS:
10 mL% lidocaine

COMPLICATIONS:
None immediate.

PROCEDURE:
An ultrasound guided thoracentesis was thoroughly discussed with the
patient and questions answered. The benefits, risks, alternatives
and complications were also discussed. The patient understands and
wishes to proceed with the procedure. Written consent was obtained.

Ultrasound was performed to localize and mark an adequate pocket of
fluid in the right chest. The area was then prepped and draped in
the normal sterile fashion. 1% Lidocaine was used for local
anesthesia. Under ultrasound guidance a 6 Fr Safe-T-Centesis
catheter was introduced. Thoracentesis was performed. The catheter
was removed and a dressing applied.
FINDINGS: A total of approximately 650 mL of dark red fluid was removed.
Procedure was stopped after 650 mL per patient's request secondary
to patient's symptom (chest pain). Samples were sent to the
laboratory as requested by the clinical team.
IMPRESSION: Successful ultrasound guided right thoracentesis yielding 650 mL of
pleural fluid.

## 2020-06-08 IMAGING — DX DG CHEST 1V
1 series · 1 of 1 positions shown · non-contrast
Comparison: [DATE]

CLINICAL DATA: Post thoracentesis

EXAM:
CHEST  1 VIEW

[chest ap]
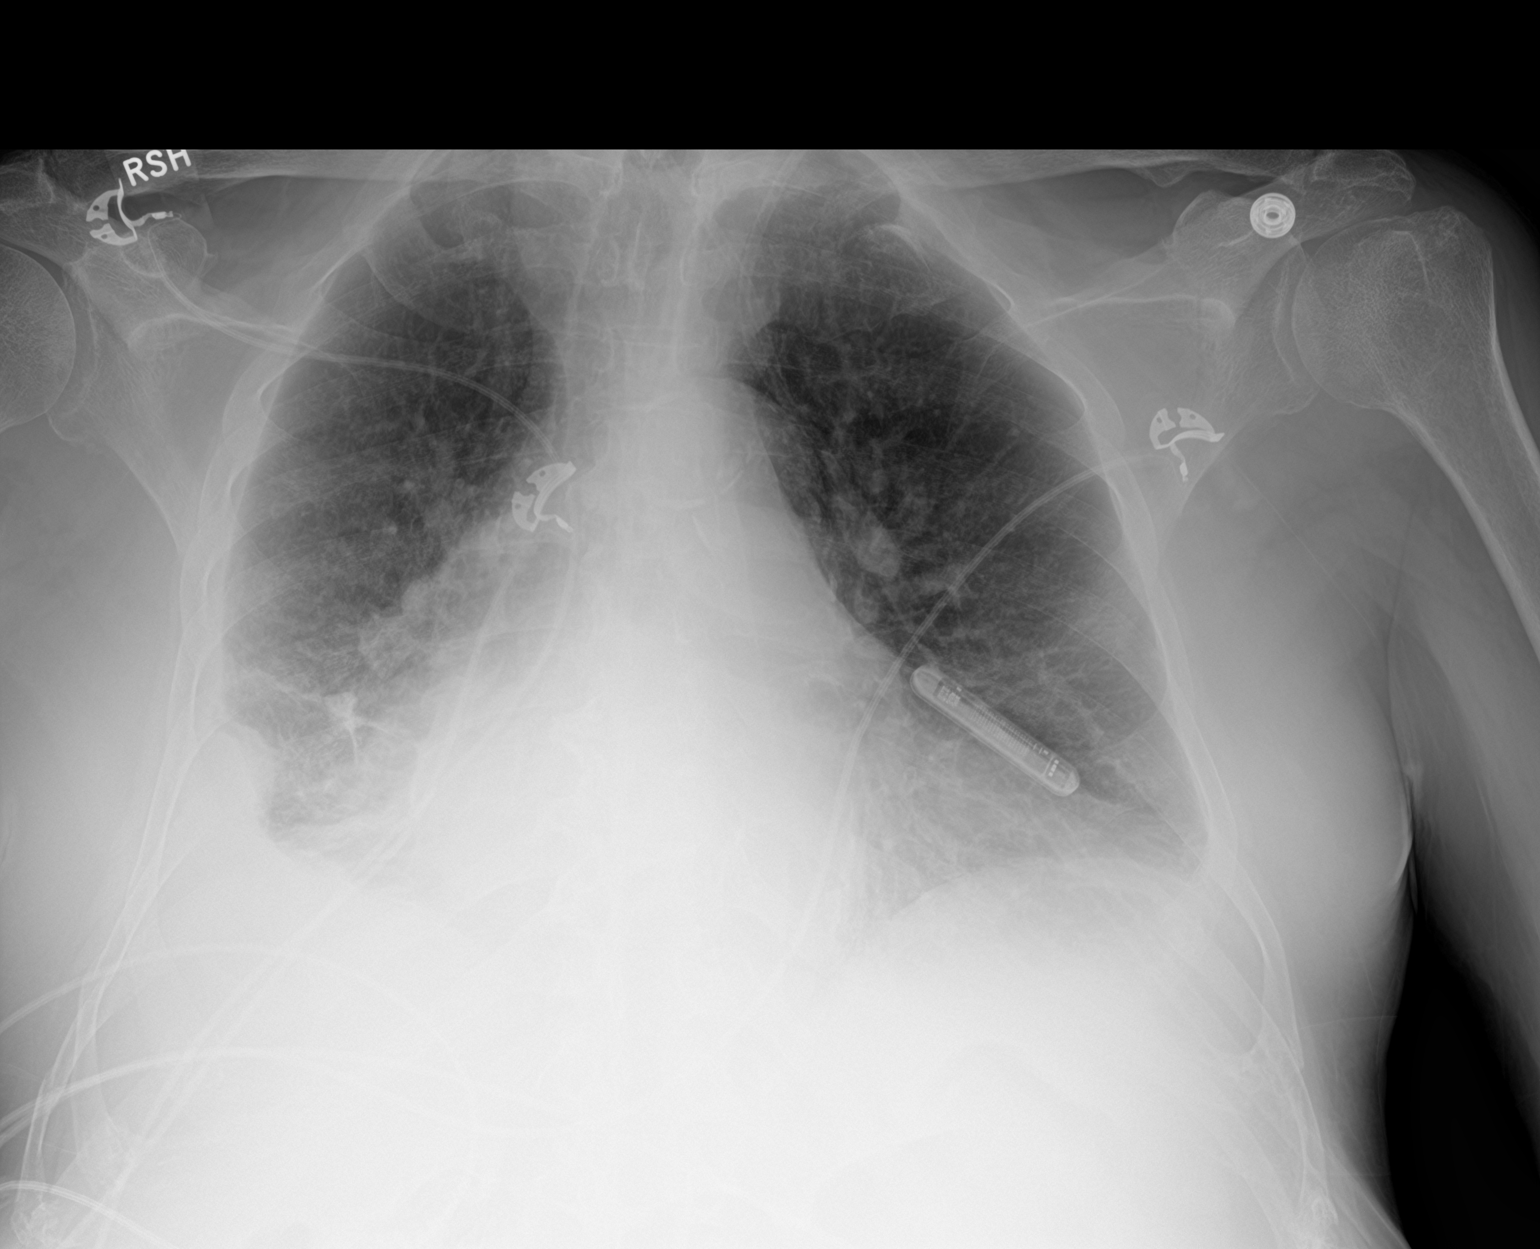

[1 of 1 positions shown; findings below may reference images not displayed]

FINDINGS: Small to moderate right pleural effusion, slightly decreased
following thoracentesis. No pneumothorax. Cardiomegaly, vascular
congestion. Right basilar atelectasis or infiltrates.
IMPRESSION: Slight decreased size of the right pleural effusion following
thoracentesis. No pneumothorax.

Cardiomegaly, vascular congestion.

Right base atelectasis or infiltrate.

## 2020-06-08 MED ORDER — ISOSORBIDE DINITRATE 20 MG PO TABS: 20.0000 mg | ORAL_TABLET | Freq: Two times a day (BID) | ORAL | 11 refills | Status: DC

## 2020-06-08 MED ORDER — SORBITOL 70 % SOLN
960.0000 mL | TOPICAL_OIL | Freq: Once | ORAL | Status: DC
  Filled 2020-06-08: qty 473

## 2020-06-08 MED ORDER — LIDOCAINE HCL 1 % IJ SOLN
INTRAMUSCULAR | Status: AC
  Filled 2020-06-08: qty 20

## 2020-06-08 MED ORDER — LIDOCAINE HCL (PF) 1 % IJ SOLN
INTRAMUSCULAR | Status: DC | PRN
  Administered 2020-06-08: 30 mL

## 2020-06-08 NOTE — Progress Notes (Signed)
Mobility Specialist: Progress Note   06/08/20 1153  Mobility  Activity Ambulated in hall  Level of Assistance Standby assist, set-up cues, supervision of patient - no hands on  Assistive Device Front wheel walker  Distance Ambulated (ft) 150 ft  Mobility Ambulated with assistance in hallway  Mobility Response Tolerated well  Mobility performed by Mobility specialist  $Mobility charge 1 Mobility   Pre-Mobility: 103 HR, 95% SpO2 During Mobility: 111 HR, 95% SpO2 Post-Mobility: 107 HR, 133/61 BP, 100% SpO2  Pt ambulated on 2 L/min [place]. Pt c/o arms getting tired during ambulation, otherwise asx. Pt required VC for RW proximity as well. Pt sitting EOB with NT present in room.   [facility] Aspyn Warnke Mobility Specialist Mobility Specialist Phone: [phone]

## 2020-06-08 NOTE — Discharge Summary (Signed)
Physician Discharge Summary  Nicholas [name] WNU:[id number] DOB: 1930/10/19 DOA: 06/04/2020  PCP: [facility]  Admit date: 06/04/2020 Discharge date: 06/08/2020  Admitted From: Home Disposition: Home  Recommendations for Outpatient Follow-up:  1. Follow up with PCP in 1-2 weeks 2. Please obtain BMP/CBC in one week 3. Patient will need outpatient CT abdomen with contrast (renal protocol) to evaluate left renal mass.  He plans to follow-up with urology at [facility] 4. Patient will also need CT scan of the chest with contrast to evaluate residual left pleural effusion  Home Health: Oxygen at 2 L Equipment/Devices:  Discharge Condition: Stable CODE STATUS: Full code Diet recommendation: Heart healthy, carb modified  Brief/Interim Summary: 87 y/o male with known CAD, ESRD, DM, admitted to the hospital with chest pain. He underwent cardia cath on 5/13 which did not show any significant change in disease from prior cath. Recommendations were for medical management. Post cath, patient developed acute anemia with hemoglobin of 5.7. He is being transfused PRBC. Nephrology following for HD needs.  Discharge Diagnoses:  Principal Problem:   NSTEMI (non-ST elevated myocardial infarction) ([place]) Active Problems:   Diabetes mellitus ([place])   Hypertension   Hypercholesteremia   ESRD (end stage renal disease) on dialysis ([place])   Anemia of chronic disease   Type II diabetes mellitus with renal manifestations (HCC)   GERD (gastroesophageal reflux disease)   CAD (coronary artery disease)   Chronic systolic CHF (congestive heart failure) (HCC)   Acute anemia   Chest pain   Left renal mass  Unstable Angina -s/p cardiac cath on 5/13 -results did not indicate significant change from prior cath, and there did appear to be any options for PCI -patient did not want to undergo CABG -recommendations were for medical management -currently on plavix, nitrates, statin -cardiology  following  Right pleural effusion -mod pleural effusion noted on chest xray -patient is hypoxic and short of breath on ambulation -Status post thoracentesis with removal of 650 cc of fluid -He will need further evaluation with CT chest with contrast to evaluate for any underlying lesions -This was offered to be done in the hospital, but patient elected to do this as an outpatient  Acute respiratory failure with hypoxia -Likely secondary to pleural effusion -Patient was noted to be 85% on room air while ambulating -Improved with supplemental oxygen  Acute on chronic anemia -patient does have anemia of chronic kidney disease -baseline hemoglobin appears to be around 7-8 -hemoglobin dropped to 5.7 this morning -he does not have any obvious bleeding -his cath site does not show a large hematoma and he has not had any evidence of GI bleeding -CT abdomen negative for RP bleed -he received 1 unit prbc with improvement of hemoglobin -continue to follow  ESRD on HD -nephrology following -Patient underwent dialysis on 5/14  Diabetes Mellitus, type 2, insulin dependent -currently on home dose of 70/30 -supplement with sliding scale -A1C of 7.9 -blood sugars stable  HLD -continue statin  Left renal mass -s/p right nephrectomy due to malignancy -noted to have 3.3cm mass in superior left kidney and another mass of 1.1cm.  -both masses were present on prior imaging -patient plans to follow up with urology at the [facility] for further work up  Discharge Instructions  Discharge Instructions    Diet - low sodium heart healthy   Complete by: As directed    Increase activity slowly   Complete by: As directed      Allergies as of 06/08/2020  Reactions   Hydrochlorothiazide Other (See Comments)   [facility]      Medication List    TAKE these medications   acetaminophen 500 MG tablet Commonly known as: TYLENOL Take 500 mg by mouth 3 (three) times daily as needed  for moderate pain.   atorvastatin 80 MG tablet Commonly known as: LIPITOR Take 80 mg by mouth at bedtime.   baclofen 10 MG tablet Commonly known as: LIORESAL Take 5 mg by mouth at bedtime as needed for muscle spasms.   clopidogrel 75 MG tablet Commonly known as: PLAVIX Take 1 tablet (75 mg total) by mouth daily with breakfast.   diphenhydrAMINE 25 MG tablet Commonly known as: SOMINEX Take 25 mg by mouth at bedtime as needed for itching.   gabapentin 100 MG capsule Commonly known as: NEURONTIN Take 100 mg by mouth every other day.   insulin NPH-regular Human (70-30) 100 UNIT/ML injection Inject 20 Units into the skin 2 (two) times daily with a meal.   isosorbide dinitrate 20 MG tablet Commonly known as: ISORDIL Take 1 tablet (20 mg total) by mouth 2 (two) times daily. What changed: when to take this   lidocaine-prilocaine cream Commonly known as: EMLA Apply 1 application topically See admin instructions. Apply topically 30 minutes prior to dialysis on Tuesday, Thursday, Saturday   multivitamin with minerals Tabs tablet Take 1 tablet by mouth daily.   NEPRO/CARBSTEADY PO Take 237 mLs by mouth daily. vanilla   nitroGLYCERIN 0.4 MG SL tablet Commonly known as: NITROSTAT Place 1 tablet (0.4 mg total) under the tongue every 5 (five) minutes x 3 doses as needed for chest pain.   sevelamer carbonate 800 MG tablet Commonly known as: RENVELA Take 1,600 mg by mouth 3 (three) times daily.            Durable Medical Equipment  (From admission, onward)         Start     Ordered   06/08/20 1254  For home use only DME oxygen  Once       Question Answer Comment  Length of Need 6 Months   Mode or (Route) Nasal cannula   Liters per Minute 2   Frequency Continuous (stationary and portable oxygen unit needed)   Oxygen conserving device Yes   Oxygen delivery system Gas      06/08/20 1254          Follow-up Information    Belva Crome, MD Follow up on 07/14/2020.    Specialty: Cardiology Why: at 8:45 AM with his PA  [place] information: [address] [place] [address] [phone]        Windy Fast, MD Follow up.   Specialty: Internal Medicine Why: follow up with your primary care physician in 1-2 weeks You will need a CT scan with contrast (renal protocol) of abdomen to evaluate mass in left kidney You will need CT chest with contrast to evaluate right lung and pleural effusion Contact information: [place] [address] [address], [facility] Follow up.   Why: Oxygen  Contact information: [address] [place] [address] [phone]              Allergies  Allergen Reactions  . Hydrochlorothiazide Other (See Comments)    Unknown [facility]    Consultations:  Nephrology  Cardiology   Procedures/Studies: CT ABDOMEN PELVIS WO CONTRAST  Result Date: 06/06/2020  CLINICAL DATA:  Retroperitoneal hematoma suspected, pt unable to bring arms above head EXAM: CT ABDOMEN AND PELVIS WITHOUT CONTRAST TECHNIQUE: Multidetector CT imaging of the abdomen and pelvis was performed following the standard protocol without IV contrast. COMPARISON:  None. FINDINGS: Evaluation of the upper abdomen somewhat limited by patient unable to raise arms. Lower chest: Large right pleural effusion and adjacent opacities likely representing atelectasis. Small left pleural effusion. Gynecomastia. Hepatobiliary: No focal liver abnormality is seen. Gallbladder is decompressed. Pancreas: Unremarkable. No surrounding inflammatory changes. Spleen: Normal in size.  Scattered granulomas. Adrenals/Urinary Tract: Adrenal glands are unremarkable. There is a 3.3 cm mass in the superior left kidney with attenuation greater than expected for a simple cyst, previously measuring 3.0 cm no hydronephrosis or renal calculi. The right kidney is surgically absent. There is an additional smaller  solid exophytic mass laterally in the left kidney measuring approximately 1.1 cm, previously 0.9 cm. Urinary bladder unremarkable. Stomach/Bowel: Stomach is within normal limits. Appendix not definitely visualized. No evidence of bowel wall thickening, distention, or inflammatory changes. Multiple colonic diverticula. Vascular/Lymphatic: Aortic atherosclerosis. Vascular patency cannot be assessed in the absence IV contrast no enlarged abdominal or pelvic lymph nodes. Reproductive: Prostate is unremarkable. Other: No abdominopelvic ascites. No evidence of retroperitoneal hematoma. Musculoskeletal: Severe degenerative changes in the bilateral hips. Lower lumbar facet arthrosis. IMPRESSION: 1. No evidence of retroperitoneal hematoma or other acute intra-abdominal process on a noncontrast scan. 2. Large right pleural effusion and adjacent opacities likely representing atelectasis. Small left pleural effusion. 3. There is a 3.3 cm mass in the superior left kidney with attenuation greater than expected for a simple cyst, previously measuring 3.0 cm. There is an additional smaller solid exophytic mass laterally in the left kidney measuring approximately 1.1 cm, previously 0.9 cm. Recommend nonemergent CT or MRI renal protocol or further characterization. 4. Aortic atherosclerosis. Aortic Atherosclerosis (ICD10-I70.0). Electronically Signed   By: Audie Pinto M.D.   On: 06/06/2020 12:29   DG Chest 1 View  Result Date: 06/08/2020 CLINICAL DATA:  Post thoracentesis EXAM: CHEST  1 VIEW COMPARISON:  06/04/2020 FINDINGS: Small to moderate right pleural effusion, slightly decreased following thoracentesis. No pneumothorax. Cardiomegaly, vascular congestion. Right basilar atelectasis or infiltrates. IMPRESSION: Slight decreased size of the right pleural effusion following thoracentesis. No pneumothorax. Cardiomegaly, vascular congestion. Right base atelectasis or infiltrate. Electronically Signed   By: Rolm Baptise M.D.    On: 06/08/2020 09:01   CARDIAC CATHETERIZATION  Result Date: 06/05/2020  Mid LAD lesion is 60% stenosed.   40 to 60% eccentric distal left main not greatly different than on prior angiography.  Left main is difficult to lay out and not easily visualized due to a loop recorder that block imaging.  The peviously stented proximal LAD contains 30% in-stent restenosis.  The jailed diagonal is totally occluded but supplied by collaterals.  The mid LAD beyond the stent contains a 50-70 % eccentric narrowing unchanged from prior imaging.  Circumflex territory is small.  The proximal vessel contains 80% stenosis and is unchanged compared to the prior angiogram.  A large obtuse marginal is totally occluded and supplied by left to left collaterals.  Right coronary is totally occluded in the distal vessel receives right to right and left to right collaterals.  LVEDP 10 mmHg.  Ventriculography was not performed. RECOMMENDATIONS:  Compared to prior images, left main is about the same as before.  Symptoms could be related to the left main.  The patient's age, comorbidities, and unwillingness to consider coronary bypass  grafting leave few options for management.  PCI would be single remaining vessel and high risk.  Discussed with patient who prefers conservative management at this point.  Continue medical therapy.  Guarded prognosis.   DG Chest Port 1 View  Result Date: 06/04/2020 CLINICAL DATA:  Chest pain EXAM: PORTABLE CHEST 1 VIEW COMPARISON:  Chest radiograph dated 03/17/2019 and chest CT dated 03/25/2020. FINDINGS: The heart is enlarged. Vascular calcifications are seen in the aortic arch. A cardiac loop recorder overlies the left chest. A moderate right pleural effusion with associated atelectasis/airspace disease is noted. There is mild left basilar atelectasis. There is no significant left pleural effusion. There is no pneumothorax on either side. Degenerative changes are seen in the spine. IMPRESSION:  Moderate right pleural effusion with associated atelectasis/airspace disease. Cardiomegaly. Aortic Atherosclerosis (ICD10-I70.0). Electronically Signed   By: Zerita Boers M.D.   On: 06/04/2020 16:36   CUP PACEART REMOTE DEVICE CHECK  Result Date: 05/19/2020 ILR summary report received. Battery status OK. Normal device function. 8 symptom activation event ECG appear sinus rhythm w/ ectopy.  No new tachy, brady, or pause episodes. No new AF episodes. Monthly summary reports and ROV/PRN.  R. Powers, CVRS  IR THORACENTESIS ASP PLEURAL SPACE W/IMG GUIDE  Result Date: 06/08/2020 INDICATION: Patient with history of HF, CAD, NSTEMI, ESRD on HD, dyspnea, and right pleural effusion. Request is made for diagnostic and therapeutic right thoracentesis. EXAM: ULTRASOUND GUIDED DIAGNOSTIC AND THERAPEUTIC RIGHT THORACENTESIS MEDICATIONS: 10 mL% lidocaine COMPLICATIONS: None immediate. PROCEDURE: An ultrasound guided thoracentesis was thoroughly discussed with the patient and questions answered. The benefits, risks, alternatives and complications were also discussed. The patient understands and wishes to proceed with the procedure. Written consent was obtained. Ultrasound was performed to localize and mark an adequate pocket of fluid in the right chest. The area was then prepped and draped in the normal sterile fashion. 1% Lidocaine was used for local anesthesia. Under ultrasound guidance a 6 Fr Safe-T-Centesis catheter was introduced. Thoracentesis was performed. The catheter was removed and a dressing applied. FINDINGS: A total of approximately 650 mL of dark red fluid was removed. Procedure was stopped after 650 mL per patient's request secondary to patient's symptom (chest pain). Samples were sent to the laboratory as requested by the clinical team. IMPRESSION: Successful ultrasound guided right thoracentesis yielding 650 mL of pleural fluid. Read by: Earley Abide, PA-C Electronically Signed   By: Markus Daft M.D.    On: 06/08/2020 09:13       Subjective:   Discharge Exam: Vitals:   06/08/20 0908 06/08/20 1155 06/08/20 1235 06/08/20 1459  BP: 117/60 133/61    Pulse: 94 96 (!) 102 90  Resp: 18 18    Temp:  98.1 F (36.7 C)    TempSrc:  Oral    SpO2: 100% 100% 90% 100%  Weight:      Height:        General: Pt is alert, awake, not in acute distress Cardiovascular: RRR, S1/S2 +, no rubs, no gallops Respiratory: CTA bilaterally, no wheezing, no rhonchi Abdominal: Soft, NT, ND, bowel sounds + Extremities: no edema, no cyanosis    The results of significant diagnostics from this hospitalization (including imaging, microbiology, ancillary and laboratory) are listed below for reference.     Microbiology: Recent Results (from the past 240 hour(s))  Resp Panel by RT-PCR (Flu A&B, Covid) Nasopharyngeal Swab     Status: None   Collection Time: 06/04/20  5:12 PM   Specimen: Nasopharyngeal Swab; Nasopharyngeal(NP) swabs  in vial transport medium  Result Value Ref Range Status   SARS Coronavirus 2 by RT PCR NEGATIVE NEGATIVE Final    Comment: (NOTE) SARS-CoV-2 target nucleic acids are NOT DETECTED.  The SARS-CoV-2 RNA is generally detectable in upper respiratory specimens during the acute phase of infection. The lowest concentration of SARS-CoV-2 viral copies this assay can detect is 138 copies/mL. A negative result does not preclude SARS-Cov-2 infection and should not be used as the sole basis for treatment or other patient management decisions. A negative result may occur with  improper specimen collection/handling, submission of specimen other than nasopharyngeal swab, presence of viral mutation(s) within the areas targeted by this assay, and inadequate number of viral copies(<138 copies/mL). A negative result must be combined with clinical observations, patient history, and epidemiological information. The expected result is Negative.  Fact Sheet for Patients:   EntrepreneurPulse.com.au  Fact Sheet for Healthcare Providers:  IncredibleEmployment.be  This test is no t yet approved or cleared by the [place] FDA and  has been authorized for detection and/or diagnosis of SARS-CoV-2 by FDA under an Emergency Use Authorization (EUA). This EUA will remain  in effect (meaning this test can be used) for the duration of the COVID-19 declaration under Section 564(b)(1) of the Act, 21 U.S.C.section 360bbb-3(b)(1), unless the authorization is terminated  or revoked sooner.       Influenza A by PCR NEGATIVE NEGATIVE Final   Influenza B by PCR NEGATIVE NEGATIVE Final    Comment: (NOTE) The Xpert Xpress SARS-CoV-2/FLU/RSV plus assay is intended as an aid in the diagnosis of influenza from Nasopharyngeal swab specimens and should not be used as a sole basis for treatment. Nasal washings and aspirates are unacceptable for Xpert Xpress SARS-CoV-2/FLU/RSV testing.  Fact Sheet for Patients: EntrepreneurPulse.com.au  Fact Sheet for Healthcare Providers: IncredibleEmployment.be  This test is not yet approved or cleared by the [place] FDA and has been authorized for detection and/or diagnosis of SARS-CoV-2 by FDA under an Emergency Use Authorization (EUA). This EUA will remain in effect (meaning this test can be used) for the duration of the COVID-19 declaration under Section 564(b)(1) of the Act, 21 U.S.C. section 360bbb-3(b)(1), unless the authorization is terminated or revoked.  Performed at [facility], [address] [place], [place] [address]   Body fluid culture w Gram Stain     Status: None (Preliminary result)   Collection Time: 06/08/20  8:39 AM   Specimen: Lung, Right; Pleural Fluid  Result Value Ref Range Status   Specimen Description PLEURAL FLUID  Final   Special Requests LUNG RIGHT  Final   Gram Stain   Final    MODERATE WBC PRESENT,  PREDOMINANTLY MONONUCLEAR NO ORGANISMS SEEN Performed at [facility], [place] [address], [place], [place] [address]    Culture PENDING  Incomplete   Report Status PENDING  Incomplete     Labs: BNP (last 3 results) Recent Labs    06/04/20 1650  BNP 235.5*   Basic Metabolic Panel: Recent Labs  Lab 06/04/20 1650 06/05/20 0740 06/06/20 1341 06/07/20 0203  NA 135  --  136 134*  K 3.6  --  4.3 3.7  CL 92*  --  96* 96*  CO2 28  --  27 33*  GLUCOSE 125*  --  157* 149*  BUN 10  --  43* 17  CREATININE 2.84* 3.88* 6.55* 3.68*  CALCIUM 8.4*  --  8.6* 8.4*  PHOS  --   --  3.9 3.2   Liver Function Tests: Recent Labs  Lab 06/04/20 1650 06/06/20 1341 06/07/20 0203  AST 15  --   --   ALT 15  --   --   ALKPHOS 79  --   --   BILITOT 0.5  --   --   PROT 7.7  --   --   ALBUMIN 3.3* 2.9* 2.3*   No results for input(s): LIPASE, AMYLASE in the last 168 hours. No results for input(s): AMMONIA in the last 168 hours. CBC: Recent Labs  Lab 06/04/20 1650 06/05/20 0001 06/06/20 0136 06/06/20 1852 06/07/20 0203 06/07/20 0646 06/08/20 0343 06/08/20 1314  WBC 10.0 8.2 7.5 7.2 6.1  --  7.3  --   NEUTROABS 8.1*  --   --   --   --   --   --   --   HGB 7.6* 7.7* 5.7* 8.8* 7.3* 9.0* 7.6* 8.0*  HCT 22.9* 23.7* 17.8* 26.2* 22.7* 27.1* 23.1* 24.8*  MCV 90.9 92.2 92.2 90.3 91.2  --  92.4  --   PLT 320 308 259 360 243  --  247  --    Cardiac Enzymes: No results for input(s): CKTOTAL, CKMB, CKMBINDEX, TROPONINI in the last 168 hours. BNP: Invalid input(s): POCBNP CBG: Recent Labs  Lab 06/07/20 2123 06/07/20 2145 06/07/20 2219 06/08/20 0739 06/08/20 1152  GLUCAP 68* 72 104* 122* 209*   D-Dimer No results for input(s): DDIMER in the last 72 hours. Hgb A1c No results for input(s): HGBA1C in the last 72 hours. Lipid Profile No results for input(s): CHOL, HDL, LDLCALC, TRIG, CHOLHDL, LDLDIRECT in the last 72 hours. Thyroid function studies No results for input(s): TSH,  T4TOTAL, T3FREE, THYROIDAB in the last 72 hours.  Invalid input(s): FREET3 Anemia work up No results for input(s): VITAMINB12, FOLATE, FERRITIN, TIBC, IRON, RETICCTPCT in the last 72 hours. Urinalysis No results found for: COLORURINE, APPEARANCEUR, [place], [place], GLUCOSEU, [place], [place], [place], PROTEINUR, UROBILINOGEN, NITRITE, LEUKOCYTESUR Sepsis Labs Invalid input(s): PROCALCITONIN,  WBC,  LACTICIDVEN Microbiology Recent Results (from the past 240 hour(s))  Resp Panel by RT-PCR (Flu A&B, Covid) Nasopharyngeal Swab     Status: None   Collection Time: 06/04/20  5:12 PM   Specimen: Nasopharyngeal Swab; Nasopharyngeal(NP) swabs in vial transport medium  Result Value Ref Range Status   SARS Coronavirus 2 by RT PCR NEGATIVE NEGATIVE Final    Comment: (NOTE) SARS-CoV-2 target nucleic acids are NOT DETECTED.  The SARS-CoV-2 RNA is generally detectable in upper respiratory specimens during the acute phase of infection. The lowest concentration of SARS-CoV-2 viral copies this assay can detect is 138 copies/mL. A negative result does not preclude SARS-Cov-2 infection and should not be used as the sole basis for treatment or other patient management decisions. A negative result may occur with  improper specimen collection/handling, submission of specimen other than nasopharyngeal swab, presence of viral mutation(s) within the areas targeted by this assay, and inadequate number of viral copies(<138 copies/mL). A negative result must be combined with clinical observations, patient history, and epidemiological information. The expected result is Negative.  Fact Sheet for Patients:  EntrepreneurPulse.com.au  Fact Sheet for Healthcare Providers:  IncredibleEmployment.be  This test is no t yet approved or cleared by the [place] FDA and  has been authorized for detection and/or diagnosis of SARS-CoV-2 by FDA under an Emergency Use  Authorization (EUA). This EUA will remain  in effect (meaning this test can be used) for the duration of the COVID-19 declaration under Section  564(b)(1) of the Act, 21 U.S.C.section 360bbb-3(b)(1), unless the authorization is terminated  or revoked sooner.       Influenza A by PCR NEGATIVE NEGATIVE Final   Influenza B by PCR NEGATIVE NEGATIVE Final    Comment: (NOTE) The Xpert Xpress SARS-CoV-2/FLU/RSV plus assay is intended as an aid in the diagnosis of influenza from Nasopharyngeal swab specimens and should not be used as a sole basis for treatment. Nasal washings and aspirates are unacceptable for Xpert Xpress SARS-CoV-2/FLU/RSV testing.  Fact Sheet for Patients: EntrepreneurPulse.com.au  Fact Sheet for Healthcare Providers: IncredibleEmployment.be  This test is not yet approved or cleared by the [place] FDA and has been authorized for detection and/or diagnosis of SARS-CoV-2 by FDA under an Emergency Use Authorization (EUA). This EUA will remain in effect (meaning this test can be used) for the duration of the COVID-19 declaration under Section 564(b)(1) of the Act, 21 U.S.C. section 360bbb-3(b)(1), unless the authorization is terminated or revoked.  Performed at [facility], [address] [place], [place] [address]   Body fluid culture w Gram Stain     Status: None (Preliminary result)   Collection Time: 06/08/20  8:39 AM   Specimen: Lung, Right; Pleural Fluid  Result Value Ref Range Status   Specimen Description PLEURAL FLUID  Final   Special Requests LUNG RIGHT  Final   Gram Stain   Final    MODERATE WBC PRESENT, PREDOMINANTLY MONONUCLEAR NO ORGANISMS SEEN Performed at [facility], [place] [address] [place], [place] [address]    Culture PENDING  Incomplete   Report Status PENDING  Incomplete     Time coordinating discharge: 17mins  SIGNED:   Kathie Dike, MD  [facility] 06/08/2020,  7:33 PM   If 7PM-7AM, please contact night-coverage [email or web address]

## 2020-06-08 NOTE — Progress Notes (Signed)
Progress Note  Patient Name: Nicholas [name] Date of Encounter: 06/08/2020  [facility] Cardiologist: Sinclair Grooms, MD   Subjective   Just back from thoracentesis.  No chest pain or SOB.    Inpatient Medications    Scheduled Meds: . atorvastatin  80 mg Oral Daily  . Chlorhexidine Gluconate Cloth  6 each Topical Q0600  . cinacalcet  60 mg Oral Once per day on Tue Thu Sat   And  . cinacalcet  30 mg Oral Once per day on Sun Mon Wed Fri  . clopidogrel  75 mg Oral Q breakfast  . dextrose  1 ampule Intravenous Once  . feeding supplement (NEPRO CARB STEADY)  237 mL Oral BID BM  . heparin  5,000 Units Subcutaneous Q8H  . insulin aspart  0-5 Units Subcutaneous QHS  . insulin aspart  0-6 Units Subcutaneous TID WC  . insulin glargine  15 Units Subcutaneous Once  . isosorbide dinitrate  20 mg Oral BID  . lidocaine      . multivitamin  1 tablet Oral QHS  . sevelamer carbonate  1,600 mg Oral TID  . sodium chloride flush  3 mL Intravenous Q12H  . sodium chloride flush  3 mL Intravenous Q12H   Continuous Infusions: . sodium chloride     PRN Meds: sodium chloride, acetaminophen, acetaminophen, HYDROcodone-acetaminophen, lidocaine (PF), nitroGLYCERIN, nitroGLYCERIN, ondansetron (ZOFRAN) IV, ondansetron (ZOFRAN) IV, oxyCODONE, sodium chloride flush   Vital Signs    Vitals:   06/07/20 1630 06/07/20 2005 06/08/20 0423 06/08/20 0750  BP:  (!) 94/46 (!) 109/57 (!) 109/52  Pulse: 81 83 76 90  Resp: 18 18  16   Temp: 03.0 F (36.6 C) 97.9 F (36.6 C) 97.8 F (36.6 C) 98 F (36.7 C)  TempSrc: Oral Oral Oral Oral  SpO2: (!) 89% 100% 100% 100%  Weight:      Height:        Intake/Output Summary (Last 24 hours) at 06/08/2020 0826 Last data filed at 06/07/2020 2133 Gross per 24 hour  Intake 3 ml  Output --  Net 3 ml   Last 3 Weights 06/06/2020 06/06/2020 06/06/2020  Weight (lbs) 154 lb 15.7 oz 158 lb 15.2 oz 159 lb  Weight (kg) 70.3 kg 72.1 kg 72.122 kg      Telemetry    SR  - Personally Reviewed  ECG    No new - Personally Reviewed  Physical Exam   GEN: No acute distress.   Neck: No JVD Cardiac: RRR, no murmurs, rubs, or gallops. Rt groin cath site without hematoma Respiratory: Clear to auscultation bilaterally. GI: Soft, nontender, non-distended  MS: No edema; No deformity. Neuro:  Nonfocal  Psych: Normal affect   Labs    High Sensitivity Troponin:   Recent Labs  Lab 06/04/20 1650 06/04/20 2011 06/04/20 2210 06/05/20 0001  TROPONINIHS 15 25* 32* 40*      Chemistry Recent Labs  Lab 06/04/20 1650 06/05/20 0740 06/06/20 1341 06/07/20 0203  NA 135  --  136 134*  K 3.6  --  4.3 3.7  CL 92*  --  96* 96*  CO2 28  --  27 33*  GLUCOSE 125*  --  157* 149*  BUN 10  --  43* 17  CREATININE 2.84* 3.88* 6.55* 3.68*  CALCIUM 8.4*  --  8.6* 8.4*  PROT 7.7  --   --   --   ALBUMIN 3.3*  --  2.9* 2.3*  AST 15  --   --   --  ALT 15  --   --   --   ALKPHOS 79  --   --   --   BILITOT 0.5  --   --   --   GFRNONAA 21* 14* 8* 15*  ANIONGAP 15  --  13 5     Hematology Recent Labs  Lab 06/06/20 1852 06/07/20 0203 06/07/20 0646 06/08/20 0343  WBC 7.2 6.1  --  7.3  RBC 2.90* 2.49*  --  2.50*  HGB 8.8* 7.3* 9.0* 7.6*  HCT 26.2* 22.7* 27.1* 23.1*  MCV 90.3 91.2  --  92.4  MCH 30.3 29.3  --  30.4  MCHC 33.6 32.2  --  32.9  RDW 14.5 14.6  --  14.6  PLT 360 243  --  247    BNP Recent Labs  Lab 06/04/20 1650  BNP 268.2*     DDimer No results for input(s): DDIMER in the last 168 hours.   Radiology    CT ABDOMEN PELVIS WO CONTRAST  Result Date: 06/06/2020 CLINICAL DATA:  Retroperitoneal hematoma suspected, pt unable to bring arms above head EXAM: CT ABDOMEN AND PELVIS WITHOUT CONTRAST TECHNIQUE: Multidetector CT imaging of the abdomen and pelvis was performed following the standard protocol without IV contrast. COMPARISON:  None. FINDINGS: Evaluation of the upper abdomen somewhat limited by patient unable to raise arms. Lower chest:  Large right pleural effusion and adjacent opacities likely representing atelectasis. Small left pleural effusion. Gynecomastia. Hepatobiliary: No focal liver abnormality is seen. Gallbladder is decompressed. Pancreas: Unremarkable. No surrounding inflammatory changes. Spleen: Normal in size.  Scattered granulomas. Adrenals/Urinary Tract: Adrenal glands are unremarkable. There is a 3.3 cm mass in the superior left kidney with attenuation greater than expected for a simple cyst, previously measuring 3.0 cm no hydronephrosis or renal calculi. The right kidney is surgically absent. There is an additional smaller solid exophytic mass laterally in the left kidney measuring approximately 1.1 cm, previously 0.9 cm. Urinary bladder unremarkable. Stomach/Bowel: Stomach is within normal limits. Appendix not definitely visualized. No evidence of bowel wall thickening, distention, or inflammatory changes. Multiple colonic diverticula. Vascular/Lymphatic: Aortic atherosclerosis. Vascular patency cannot be assessed in the absence IV contrast no enlarged abdominal or pelvic lymph nodes. Reproductive: Prostate is unremarkable. Other: No abdominopelvic ascites. No evidence of retroperitoneal hematoma. Musculoskeletal: Severe degenerative changes in the bilateral hips. Lower lumbar facet arthrosis. IMPRESSION: 1. No evidence of retroperitoneal hematoma or other acute intra-abdominal process on a noncontrast scan. 2. Large right pleural effusion and adjacent opacities likely representing atelectasis. Small left pleural effusion. 3. There is a 3.3 cm mass in the superior left kidney with attenuation greater than expected for a simple cyst, previously measuring 3.0 cm. There is an additional smaller solid exophytic mass laterally in the left kidney measuring approximately 1.1 cm, previously 0.9 cm. Recommend nonemergent CT or MRI renal protocol or further characterization. 4. Aortic atherosclerosis. Aortic Atherosclerosis (ICD10-I70.0).  Electronically Signed   By: Audie Pinto M.D.   On: 06/06/2020 12:29    Cardiac Studies   Cardiac cath 06/05/20     Mid LAD lesion is 60% stenosed.    40 to 60% eccentric distal left main not greatly different than on prior angiography.  Left main is difficult to lay out and not easily visualized due to a loop recorder that block imaging.  The peviously stented proximal LAD contains 30% in-stent restenosis.  The jailed diagonal is totally occluded but supplied by collaterals.  The mid LAD beyond the stent contains a 50-70 %  eccentric narrowing unchanged from prior imaging.  Circumflex territory is small.  The proximal vessel contains 80% stenosis and is unchanged compared to the prior angiogram.  A large obtuse marginal is totally occluded and supplied by left to left collaterals.  Right coronary is totally occluded in the distal vessel receives right to right and left to right collaterals.  LVEDP 10 mmHg.  Ventriculography was not performed.  RECOMMENDATIONS:   Compared to prior images, left main is about the same as before.  Symptoms could be related to the left main.  The patient's age, comorbidities, and unwillingness to consider coronary bypass grafting leave few options for management.  PCI would be single remaining vessel and high risk.  Discussed with patient who prefers conservative management at this point.  Continue medical therapy.  Guarded prognosis.  Diagnostic Dominance: Right    Intervention    Patient Profile     87 y.o. male with CAD s/p PCI (07/2018, pLAD), ESRD/iHD (T/R/Sa), HLD, and HTN who presented to [facility] ED with substernal chest pressure since 05/12 AM.   Assessment & Plan    Chest pain with history of CAD s/p pLAD PCI 07/2018: -Patient presented with chest pain which began 06/04/2020 at which time he called for an appointment with Dr. Tamala Julian who recommended ED evaluation given his history.  Apparently, he has been having consistent  exertional angina relieved with rest for the last year.  More recently, prior to HD on Thursday he began having acute onset of centralized nonradiating chest pain relieved with nitroglycerin with several subsequent episodes also relieved with nitroglycerin. -HST, 25>> 32>> 40 -Last intervention with NSTEMI 07/2018 treated pLAD with orbital arthrectomy and stenting -Last echocardiogram 03/18/2019 with LVEF at 60 to 65% with G1 DD and no evidence of valvular disease -On PTA Plavix, does not appear to be on ASA and high intensity atorvastatin -Cath 06/05/20 showed 40 to 60% distal left main stenosis, 30% in-stent restenosis of proximal LAD, occluded diagonal supplied by collaterals, 50 to 70% mid LAD stenosis, proximal LCx 80%, occluded OM, occluded RCA.  Conservative management recommended.  Increased Isordil to 20 mg twice daily.  Reports no current anginal symptoms.  Pt has not wished to have CABG  Acute anemia: Hemoglobin down to 5.7 on 5/14.  2 units PRBCs ordered.  Right femoral access for cath on 5/13, site looks good, no evidence of bleeding.  CT abdomen pelvis showed no evidence of bleed.  Hemoglobin improved to 7.3 and now 7.6 this morning   ESRD: -HD TTHS -Follows with nephrology -BNP on ED arrival 268>> fluid volume managed with HD  HTN: -Not currently on any antihypertensives, BP soft.  HLD: -LDL 122 on 06/05/20, reports compliance with high intensity atorvastatin.  Will need lipid clinic referral for possible PCSK9 inhibitor  DM2: -SSI for glucose control while inpatient -Per primary team   Pl effusion today with thoracentesis removed 650 ml dark red, stopped at 650 due to chest pain.  Slight decrease in Rt pl effusion on CXR.  + cardiomegaly and vascular congestion.      For questions or updates, please contact [place] Please consult [email or web address] for contact info under        Signed, Cecilie Kicks, NP  06/08/2020, 8:26 AM

## 2020-06-08 NOTE — Procedures (Addendum)
PROCEDURE SUMMARY:  Successful image-guided righ thoracentesis. Yielded 650 milliliters of dark red fluid. Procedure was stopped after 650 milliliters per patient's request secondary to patient's symptom (chest pain). Patient tolerated procedure well. No immediate complications. EBL = 0 mL.  Specimen was sent for labs. CXR ordered.  Please see imaging section of Epic for full dictation.   Earley Abide PA-C 06/08/2020 8:37 AM

## 2020-06-08 NOTE — Discharge Instructions (Signed)
Call [facility] at [phone] if any bleeding, swelling or drainage at cath site.  May shower, no tub baths for 48 hours for groin sticks. No lifting over 5 pounds for 3 days.  No Driving for 3 days  Call if any cardiac concerns

## 2020-06-08 NOTE — Progress Notes (Signed)
SATURATION QUALIFICATIONS:  Patient Saturations on Room Air at Rest = 87%  Patient Saturations on [employer] while Ambulating = 85%  Patient Saturations on 2 Liters of oxygen while Ambulating = 95%  Please briefly explain why patient needs home oxygen: Pt oxygen saturation dropped below 85% on Room Air.

## 2020-06-08 NOTE — Care Management (Signed)
1600 06-08-20 Patient in need of oxygen- Case Manager ordered via [place]. Oxygen delivered to the patient's room prior to transition home. Graves-Bigelow, Ocie Cornfield, RN  BSN Case Manager

## 2020-06-08 NOTE — Progress Notes (Signed)
Renal Navigator notes discharge order and contacted [facility] Social Worker to inform/provide continuity of care. Navigator will fax discharge summary and renal note when available. Navigator informed SW that patient is discharging with home O2.   Alphonzo Cruise, [place] Renal Navigator [phone]

## 2020-06-09 LAB — CYTOLOGY - NON PAP

## 2020-06-10 DIAGNOSIS — M1712 Unilateral primary osteoarthritis, left knee: Secondary | ICD-10-CM | POA: Diagnosis not present

## 2020-06-10 DIAGNOSIS — M1711 Unilateral primary osteoarthritis, right knee: Secondary | ICD-10-CM | POA: Diagnosis not present

## 2020-06-11 LAB — BODY FLUID CULTURE W GRAM STAIN: Culture: NO GROWTH

## 2020-06-19 DIAGNOSIS — M1712 Unilateral primary osteoarthritis, left knee: Secondary | ICD-10-CM | POA: Diagnosis not present

## 2020-06-19 DIAGNOSIS — M1711 Unilateral primary osteoarthritis, right knee: Secondary | ICD-10-CM | POA: Diagnosis not present

## 2020-06-22 LAB — CUP PACEART REMOTE DEVICE CHECK
Date Time Interrogation Session: 20220527230311
Implantable Pulse Generator Implant Date: 20210412

## 2020-06-23 ENCOUNTER — Ambulatory Visit (INDEPENDENT_AMBULATORY_CARE_PROVIDER_SITE_OTHER): Payer: Medicare Other

## 2020-06-23 DIAGNOSIS — R55 Syncope and collapse: Secondary | ICD-10-CM

## 2020-06-26 DIAGNOSIS — M1711 Unilateral primary osteoarthritis, right knee: Secondary | ICD-10-CM | POA: Diagnosis not present

## 2020-06-26 DIAGNOSIS — M1712 Unilateral primary osteoarthritis, left knee: Secondary | ICD-10-CM | POA: Diagnosis not present

## 2020-07-04 ENCOUNTER — Other Ambulatory Visit: Payer: Self-pay

## 2020-07-04 ENCOUNTER — Encounter (HOSPITAL_COMMUNITY): Payer: Self-pay | Admitting: Emergency Medicine

## 2020-07-04 ENCOUNTER — Inpatient Hospital Stay (HOSPITAL_COMMUNITY)
Admission: EM | Admit: 2020-07-04 | Discharge: 2020-07-08 | DRG: 377 | Disposition: A | Discharge status: Home / Self Care | Payer: Medicare Other | Attending: Student | Admitting: Student

## 2020-07-04 DIAGNOSIS — K219 Gastro-esophageal reflux disease without esophagitis: Secondary | ICD-10-CM | POA: Diagnosis present

## 2020-07-04 DIAGNOSIS — R55 Syncope and collapse: Secondary | ICD-10-CM

## 2020-07-04 DIAGNOSIS — E7800 Pure hypercholesterolemia, unspecified: Secondary | ICD-10-CM | POA: Diagnosis present

## 2020-07-04 DIAGNOSIS — Z7902 Long term (current) use of antithrombotics/antiplatelets: Secondary | ICD-10-CM

## 2020-07-04 DIAGNOSIS — Z888 Allergy status to other drugs, medicaments and biological substances status: Secondary | ICD-10-CM

## 2020-07-04 DIAGNOSIS — Z9981 Dependence on supplemental oxygen: Secondary | ICD-10-CM

## 2020-07-04 DIAGNOSIS — I5042 Chronic combined systolic (congestive) and diastolic (congestive) heart failure: Secondary | ICD-10-CM | POA: Diagnosis present

## 2020-07-04 DIAGNOSIS — E785 Hyperlipidemia, unspecified: Secondary | ICD-10-CM | POA: Diagnosis present

## 2020-07-04 DIAGNOSIS — K5731 Diverticulosis of large intestine without perforation or abscess with bleeding: Principal | ICD-10-CM | POA: Diagnosis present

## 2020-07-04 DIAGNOSIS — I2510 Atherosclerotic heart disease of native coronary artery without angina pectoris: Secondary | ICD-10-CM | POA: Diagnosis present

## 2020-07-04 DIAGNOSIS — E1122 Type 2 diabetes mellitus with diabetic chronic kidney disease: Secondary | ICD-10-CM | POA: Diagnosis present

## 2020-07-04 DIAGNOSIS — Z20822 Contact with and (suspected) exposure to covid-19: Secondary | ICD-10-CM | POA: Diagnosis present

## 2020-07-04 DIAGNOSIS — R000 Tachycardia, unspecified: Secondary | ICD-10-CM | POA: Diagnosis not present

## 2020-07-04 DIAGNOSIS — M1990 Unspecified osteoarthritis, unspecified site: Secondary | ICD-10-CM | POA: Diagnosis present

## 2020-07-04 DIAGNOSIS — N250 Renal osteodystrophy: Secondary | ICD-10-CM | POA: Diagnosis not present

## 2020-07-04 DIAGNOSIS — I252 Old myocardial infarction: Secondary | ICD-10-CM

## 2020-07-04 DIAGNOSIS — Z992 Dependence on renal dialysis: Secondary | ICD-10-CM | POA: Diagnosis not present

## 2020-07-04 DIAGNOSIS — Z955 Presence of coronary angioplasty implant and graft: Secondary | ICD-10-CM | POA: Diagnosis not present

## 2020-07-04 DIAGNOSIS — Z87891 Personal history of nicotine dependence: Secondary | ICD-10-CM

## 2020-07-04 DIAGNOSIS — J449 Chronic obstructive pulmonary disease, unspecified: Secondary | ICD-10-CM | POA: Diagnosis present

## 2020-07-04 DIAGNOSIS — Z8774 Personal history of (corrected) congenital malformations of heart and circulatory system: Secondary | ICD-10-CM | POA: Diagnosis not present

## 2020-07-04 DIAGNOSIS — R0602 Shortness of breath: Secondary | ICD-10-CM | POA: Diagnosis not present

## 2020-07-04 DIAGNOSIS — E11649 Type 2 diabetes mellitus with hypoglycemia without coma: Secondary | ICD-10-CM | POA: Diagnosis not present

## 2020-07-04 DIAGNOSIS — I959 Hypotension, unspecified: Secondary | ICD-10-CM | POA: Diagnosis not present

## 2020-07-04 DIAGNOSIS — I5022 Chronic systolic (congestive) heart failure: Secondary | ICD-10-CM | POA: Diagnosis not present

## 2020-07-04 DIAGNOSIS — N186 End stage renal disease: Secondary | ICD-10-CM | POA: Diagnosis present

## 2020-07-04 DIAGNOSIS — K5730 Diverticulosis of large intestine without perforation or abscess without bleeding: Secondary | ICD-10-CM | POA: Diagnosis not present

## 2020-07-04 DIAGNOSIS — I10 Essential (primary) hypertension: Secondary | ICD-10-CM | POA: Diagnosis present

## 2020-07-04 DIAGNOSIS — I25118 Atherosclerotic heart disease of native coronary artery with other forms of angina pectoris: Secondary | ICD-10-CM | POA: Diagnosis not present

## 2020-07-04 DIAGNOSIS — J9611 Chronic respiratory failure with hypoxia: Secondary | ICD-10-CM | POA: Diagnosis present

## 2020-07-04 DIAGNOSIS — E119 Type 2 diabetes mellitus without complications: Secondary | ICD-10-CM

## 2020-07-04 DIAGNOSIS — D631 Anemia in chronic kidney disease: Secondary | ICD-10-CM | POA: Diagnosis present

## 2020-07-04 DIAGNOSIS — D62 Acute posthemorrhagic anemia: Secondary | ICD-10-CM | POA: Diagnosis present

## 2020-07-04 DIAGNOSIS — Z79899 Other long term (current) drug therapy: Secondary | ICD-10-CM | POA: Diagnosis not present

## 2020-07-04 DIAGNOSIS — I1311 Hypertensive heart and chronic kidney disease without heart failure, with stage 5 chronic kidney disease, or end stage renal disease: Secondary | ICD-10-CM | POA: Diagnosis not present

## 2020-07-04 DIAGNOSIS — Z833 Family history of diabetes mellitus: Secondary | ICD-10-CM

## 2020-07-04 DIAGNOSIS — E1165 Type 2 diabetes mellitus with hyperglycemia: Secondary | ICD-10-CM | POA: Diagnosis not present

## 2020-07-04 DIAGNOSIS — R58 Hemorrhage, not elsewhere classified: Secondary | ICD-10-CM | POA: Diagnosis not present

## 2020-07-04 DIAGNOSIS — K922 Gastrointestinal hemorrhage, unspecified: Secondary | ICD-10-CM | POA: Diagnosis present

## 2020-07-04 DIAGNOSIS — Z794 Long term (current) use of insulin: Secondary | ICD-10-CM

## 2020-07-04 DIAGNOSIS — D638 Anemia in other chronic diseases classified elsewhere: Secondary | ICD-10-CM | POA: Diagnosis present

## 2020-07-04 DIAGNOSIS — K635 Polyp of colon: Secondary | ICD-10-CM | POA: Diagnosis present

## 2020-07-04 DIAGNOSIS — K921 Melena: Secondary | ICD-10-CM | POA: Diagnosis not present

## 2020-07-04 DIAGNOSIS — I132 Hypertensive heart and chronic kidney disease with heart failure and with stage 5 chronic kidney disease, or end stage renal disease: Secondary | ICD-10-CM | POA: Diagnosis present

## 2020-07-04 DIAGNOSIS — J90 Pleural effusion, not elsewhere classified: Secondary | ICD-10-CM | POA: Diagnosis not present

## 2020-07-04 DIAGNOSIS — Z823 Family history of stroke: Secondary | ICD-10-CM | POA: Diagnosis not present

## 2020-07-04 DIAGNOSIS — E1129 Type 2 diabetes mellitus with other diabetic kidney complication: Secondary | ICD-10-CM | POA: Diagnosis present

## 2020-07-04 DIAGNOSIS — J9811 Atelectasis: Secondary | ICD-10-CM | POA: Diagnosis not present

## 2020-07-04 LAB — CBC
HCT: 21.7 % — ABNORMAL LOW (ref 39.0–52.0)
Hemoglobin: 6.6 g/dL — CL (ref 13.0–17.0)
MCH: 29.1 pg (ref 26.0–34.0)
MCHC: 30.4 g/dL (ref 30.0–36.0)
MCV: 95.6 fL (ref 80.0–100.0)
Platelets: 308 10*3/uL (ref 150–400)
RBC: 2.27 MIL/uL — ABNORMAL LOW (ref 4.22–5.81)
RDW: 14.2 % (ref 11.5–15.5)
WBC: 11.4 10*3/uL — ABNORMAL HIGH (ref 4.0–10.5)
nRBC: 0 % (ref 0.0–0.2)

## 2020-07-04 LAB — POC OCCULT BLOOD, ED: Fecal Occult Bld: POSITIVE — AB

## 2020-07-04 LAB — COMPREHENSIVE METABOLIC PANEL
ALT: 12 U/L (ref 0–44)
AST: 11 U/L — ABNORMAL LOW (ref 15–41)
Albumin: 2.7 g/dL — ABNORMAL LOW (ref 3.5–5.0)
Alkaline Phosphatase: 61 U/L (ref 38–126)
Anion gap: 13 (ref 5–15)
BUN: 35 mg/dL — ABNORMAL HIGH (ref 8–23)
CO2: 30 mmol/L (ref 22–32)
Calcium: 9.3 mg/dL (ref 8.9–10.3)
Chloride: 96 mmol/L — ABNORMAL LOW (ref 98–111)
Creatinine, Ser: 7.06 mg/dL — ABNORMAL HIGH (ref 0.61–1.24)
GFR, Estimated: 7 mL/min — ABNORMAL LOW (ref >60)
Glucose, Bld: 253 mg/dL — ABNORMAL HIGH (ref 70–99)
Potassium: 4.8 mmol/L (ref 3.5–5.1)
Sodium: 139 mmol/L (ref 135–145)
Total Bilirubin: 0.5 mg/dL (ref 0.3–1.2)
Total Protein: 6.4 g/dL — ABNORMAL LOW (ref 6.5–8.1)

## 2020-07-04 LAB — PHOSPHORUS: Phosphorus: 4.6 mg/dL (ref 2.5–4.6)

## 2020-07-04 LAB — MAGNESIUM: Magnesium: 2.2 mg/dL (ref 1.7–2.4)

## 2020-07-04 LAB — LIPASE, BLOOD: Lipase: 39 U/L (ref 11–51)

## 2020-07-04 LAB — RESP PANEL BY RT-PCR (FLU A&B, COVID) ARPGX2
Influenza A by PCR: NEGATIVE
Influenza B by PCR: NEGATIVE
SARS Coronavirus 2 by RT PCR: NEGATIVE

## 2020-07-04 LAB — GLUCOSE, CAPILLARY: Glucose-Capillary: 241 mg/dL — ABNORMAL HIGH (ref 70–99)

## 2020-07-04 MED ORDER — SODIUM CHLORIDE 0.9% FLUSH
3.0000 mL | Freq: Two times a day (BID) | INTRAVENOUS | Status: DC
  Administered 2020-07-04 – 2020-07-08 (×7): 3 mL via INTRAVENOUS

## 2020-07-04 MED ORDER — ONDANSETRON HCL 4 MG/2ML IJ SOLN: 4.0000 mg | Freq: Four times a day (QID) | INTRAMUSCULAR | Status: DC | PRN

## 2020-07-04 MED ORDER — PANTOPRAZOLE SODIUM 40 MG PO TBEC
40.0000 mg | DELAYED_RELEASE_TABLET | Freq: Every day | ORAL | Status: DC
  Administered 2020-07-05: 40 mg via ORAL
  Filled 2020-07-04: qty 1

## 2020-07-04 MED ORDER — CHLORHEXIDINE GLUCONATE CLOTH 2 % EX PADS
6.0000 | MEDICATED_PAD | Freq: Every day | CUTANEOUS | Status: DC
  Administered 2020-07-08: 6 via TOPICAL

## 2020-07-04 MED ORDER — SEVELAMER CARBONATE 800 MG PO TABS
1600.0000 mg | ORAL_TABLET | Freq: Three times a day (TID) | ORAL | Status: DC
  Administered 2020-07-05 – 2020-07-08 (×7): 1600 mg via ORAL
  Filled 2020-07-04 (×7): qty 2

## 2020-07-04 MED ORDER — ONDANSETRON HCL 4 MG PO TABS: 4.0000 mg | ORAL_TABLET | Freq: Four times a day (QID) | ORAL | Status: DC | PRN

## 2020-07-04 MED ORDER — ATORVASTATIN CALCIUM 80 MG PO TABS
80.0000 mg | ORAL_TABLET | Freq: Every day | ORAL | Status: DC
  Administered 2020-07-05 – 2020-07-07 (×3): 80 mg via ORAL
  Filled 2020-07-04 (×3): qty 1

## 2020-07-04 MED ORDER — SODIUM CHLORIDE 0.9% IV SOLUTION: Freq: Once | INTRAVENOUS | Status: DC

## 2020-07-04 MED ORDER — ISOSORBIDE DINITRATE 20 MG PO TABS
20.0000 mg | ORAL_TABLET | Freq: Two times a day (BID) | ORAL | Status: DC
  Administered 2020-07-05 – 2020-07-08 (×5): 20 mg via ORAL
  Filled 2020-07-04 (×10): qty 1

## 2020-07-04 MED ORDER — NITROGLYCERIN 0.4 MG SL SUBL
0.4000 mg | SUBLINGUAL_TABLET | SUBLINGUAL | Status: DC | PRN
  Administered 2020-07-04: 0.4 mg via SUBLINGUAL
  Filled 2020-07-04: qty 1

## 2020-07-04 MED ORDER — METHOCARBAMOL 500 MG PO TABS
500.0000 mg | ORAL_TABLET | Freq: Three times a day (TID) | ORAL | Status: DC
  Administered 2020-07-04 – 2020-07-08 (×10): 500 mg via ORAL
  Filled 2020-07-04 (×10): qty 1

## 2020-07-04 MED ORDER — PANTOPRAZOLE SODIUM 40 MG IV SOLR
40.0000 mg | Freq: Once | INTRAVENOUS | Status: AC
  Administered 2020-07-04: 40 mg via INTRAVENOUS
  Filled 2020-07-04: qty 40

## 2020-07-04 MED ORDER — SODIUM CHLORIDE 0.9 % IV SOLN: 250.0000 mL | INTRAVENOUS | Status: DC | PRN

## 2020-07-04 MED ORDER — RENA-VITE PO TABS
1.0000 | ORAL_TABLET | Freq: Every day | ORAL | Status: DC
  Administered 2020-07-05 – 2020-07-08 (×4): 1 via ORAL
  Filled 2020-07-04 (×4): qty 1

## 2020-07-04 MED ORDER — ACETAMINOPHEN 650 MG RE SUPP: 650.0000 mg | Freq: Four times a day (QID) | RECTAL | Status: DC | PRN

## 2020-07-04 MED ORDER — SODIUM CHLORIDE 0.9% FLUSH
3.0000 mL | INTRAVENOUS | Status: DC | PRN
  Administered 2020-07-04 – 2020-07-05 (×2): 3 mL via INTRAVENOUS

## 2020-07-04 MED ORDER — LIDOCAINE-PRILOCAINE 2.5-2.5 % EX CREA: 1.0000 "application " | TOPICAL_CREAM | CUTANEOUS | Status: DC

## 2020-07-04 MED ORDER — INSULIN ASPART PROT & ASPART (70-30 MIX) 100 UNIT/ML ~~LOC~~ SUSP
20.0000 [IU] | Freq: Two times a day (BID) | SUBCUTANEOUS | Status: DC
  Administered 2020-07-06: 20 [IU] via SUBCUTANEOUS
  Filled 2020-07-04: qty 10

## 2020-07-04 MED ORDER — ACETAMINOPHEN 325 MG PO TABS
650.0000 mg | ORAL_TABLET | Freq: Four times a day (QID) | ORAL | Status: DC | PRN
  Administered 2020-07-04: 650 mg via ORAL
  Filled 2020-07-04 (×2): qty 2

## 2020-07-04 MED ORDER — DIALYVITE 3000 3 MG PO TABS: 1.0000 | ORAL_TABLET | Freq: Every day | ORAL | Status: DC

## 2020-07-04 NOTE — ED Notes (Signed)
Attempted to give reportx1 

## 2020-07-04 NOTE — Progress Notes (Signed)
Return call to E.D. to have patient's report.

## 2020-07-04 NOTE — H&P (Signed)
History and Physical    Nicholas [name] FTD:[id number] DOB: 1930-02-19 DOA: 07/04/2020  PCP: [facility] Consultants:  [facility]  [facility]ologist: Dr. Tamala Julian @ [facility] Patient coming from:  Home - lives with wife and son  Chief Complaint: syncope with melena.   HPI: Nicholas [name] is a 87 y.o. male with medical history significant of  ESRD (HD TTS), HTN, HLD, CHF, ACD with baseline hemoglobin around 7-8, hx of CAD s/p PCI 08/13/2018 with stents x2 on plavix.  Recently admitted on Jun 04, 2020 for chest pain and found to have acute on chronic anemia with a hemoglobin of 5.7. given 1 unit of packed red blood cells with improvement of his hemoglobin to baseline.  He had no evidence of GI bleeding.  He presents to ER today for a couple episodes of bright red blood in stool with subsequent syncope in his chair with loss of consciousness for 1 minute.  He states he woke up at 430 this morning and had a very large dark bowel movement.  He back to bed and then woke up with urgency to defecate and another BM blood in it and had blood when he wiped.  He started to feel nauseated but never had any episodes of vomiting and denies any abdominal pain.  He had another episode with bright red blood in the stool went and sat down in his chair and then lost consciousness his son and his mother wife witnessed this.  He woke up to cold compresses on his forehead.  Wife called 911.  denies any urine incontinence or tongue biting during this episode.  Overall he looks good and feels okay.  He denies any fever, chills, upper respiratory symptoms, abdominal pain, nausea or vomiting at this time.  He denies any leg swelling out of his baseline.  He denies any chest pain or palpitations.  Does endorse a chronic cough.   No family history of colon cancer. No abnormal colonoscopies. Last colonoscopy 02/03/19 with cauterization of cecal angiodysplasia> egd showed gastric avms s?p ost apc on 1/8 Gi capsule study: 04/01/19  ED  Course: vitals: HR 103/49, heart rate 101, respiratory rate 18, oxygen 100% on room air.  Fecal Occult was positive and hemoglobin was 6.6.  Asked to admit for acute blood loss anemia likely secondary to GI bleed. with syncope  Review of Systems: As per HPI; otherwise review of systems reviewed and negative.   Ambulatory Status:  Ambulates with cane and WC if going on longer distance    Past Medical History:  Diagnosis Date   Arthritis    Chronic kidney disease    Diabetes mellitus ([place])    Dialysis patient ([place])    GERD (gastroesophageal reflux disease)    occ   Heart murmur    Hypercholesteremia    Hypertension    Psoriasis     Past Surgical History:  Procedure Laterality Date   CARDIAC CATHETERIZATION  09   "some blockage" with collaterals by cath at [facility] ~ 2009; no intervention required; reportedly, no routine cardiology f/u recommended    CORONARY ATHERECTOMY N/A 08/09/2018   Procedure: CORONARY ATHERECTOMY;  Surgeon: Jettie Booze, MD;  Location: [place] [facility];  Service: Cardiovascular;  Laterality: N/A;   CORONARY STENT INTERVENTION N/A 08/09/2018   Procedure: CORONARY STENT INTERVENTION;  Surgeon: Jettie Booze, MD;  Location: [place] [facility];  Service: Cardiovascular;  Laterality: N/A;   INGUINAL HERNIA REPAIR Right 04/13/2012   Procedure: HERNIA REPAIR INGUINAL INCARCERATED;  Surgeon: Gayland Curry, MD;  Location: [place];  Service: General;  Laterality: Right;   INSERTION OF MESH Right 04/13/2012   Procedure: INSERTION OF MESH;  Surgeon: Gayland Curry, MD;  Location: [place];  Service: General;  Laterality: Right;   IR THORACENTESIS ASP PLEURAL SPACE W/IMG GUIDE  06/08/2020   LEFT HEART CATH N/A 08/09/2018   Procedure: Left Heart Cath;  Surgeon: Jettie Booze, MD;  Location: [place] [facility];  Service: Cardiovascular;  Laterality: N/A;   LEFT HEART CATH AND CORONARY ANGIOGRAPHY N/A 08/08/2018   Procedure: LEFT HEART CATH AND CORONARY ANGIOGRAPHY;   Surgeon: Troy Sine, MD;  Location: [place] [facility];  Service: Cardiovascular;  Laterality: N/A;   LEFT HEART CATH AND CORONARY ANGIOGRAPHY N/A 06/05/2020   Procedure: LEFT HEART CATH AND CORONARY ANGIOGRAPHY;  Surgeon: Belva Crome, MD;  Location: [place] [facility];  Service: Cardiovascular;  Laterality: N/A;   VASECTOMY      Social History   Socioeconomic History   Marital status: Married    Spouse name: Not on file   Number of children: Not on file   Years of education: Not on file   Highest education level: Not on file  Occupational History   Not on file  Tobacco Use   Smoking status: Former    Packs/day: 1.00    Years: 15.00    Pack years: 15.00    Types: Cigarettes    Quit date: 03/29/1987    Years since quitting: 33.2   Smokeless tobacco: Never  Substance and Sexual Activity   Alcohol use: No   Drug use: No   Sexual activity: Not on file  Other Topics Concern   Not on file  Social History Narrative   Not on file   Social Determinants of Health   Financial Resource Strain: Not on file  Food Insecurity: Not on file  Transportation Needs: Not on file  Physical Activity: Not on file  Stress: Not on file  Social Connections: Not on file  Intimate Partner Violence: Not on file    Allergies  Allergen Reactions   Hydrochlorothiazide Other (See Comments)    Unknown [facility] records    Family History  Problem Relation Age of Onset   Diabetes Mother    Stroke Father    Diabetes Sister    Diabetes Brother     Prior to Admission medications   Medication Sig Start Date End Date Taking? Authorizing Provider  acetaminophen (TYLENOL) 500 MG tablet Take 500 mg by mouth 3 (three) times daily as needed for moderate pain.   Yes [provider]  atorvastatin (LIPITOR) 80 MG tablet Take 80 mg by mouth at bedtime.   Yes [provider]  baclofen (LIORESAL) 10 MG tablet Take 5 mg by mouth at bedtime as needed for muscle spasms.   Yes [provider]  clopidogrel (PLAVIX) 75 MG tablet Take 1 tablet (75 mg total) by mouth daily with breakfast. 08/11/18  Yes [name], MD  diphenhydrAMINE (SOMINEX) 25 MG tablet Take 25 mg by mouth at bedtime as needed for itching.   Yes [provider]  folic acid-vitamin b complex-vitamin c-selenium-zinc (DIALYVITE) 3 MG TABS tablet Take 1 tablet by mouth daily.   Yes [provider]  insulin NPH-regular Human (70-30) 100 UNIT/ML injection Inject 20 Units into the skin 2 (two) times daily with a meal.   Yes [provider]  isosorbide dinitrate (ISORDIL) 20 MG tablet Take  1 tablet (20 mg total) by mouth 2 (two) times daily. 06/08/20  Yes Kathie Dike, MD  lidocaine-prilocaine (EMLA) cream Apply 1 application topically See admin instructions. Apply topically 30 minutes prior to dialysis on Tuesday, Thursday, Saturday 11/15/18  Yes [provider]  methocarbamol (ROBAXIN) 500 MG tablet Take 500 mg by mouth 3 (three) times daily. 06/19/20  Yes [provider]  Multiple Vitamin (MULTIVITAMIN WITH MINERALS) TABS tablet Take 1 tablet by mouth daily.   Yes [provider]  nitroGLYCERIN (NITROSTAT) 0.4 MG SL tablet Place 1 tablet (0.4 mg total) under the tongue every 5 (five) minutes x 3 doses as needed for chest pain. 08/10/18  Yes [name], MD  Nutritional Supplements (NEPRO/CARBSTEADY PO) Take 237 mLs by mouth daily. vanilla   Yes [provider]  sevelamer carbonate (RENVELA) 800 MG tablet Take 1,600 mg by mouth 3 (three) times daily. 05/22/19  Yes [provider]    Physical Exam: Vitals:   07/04/20 1315 07/04/20 1400 07/04/20 1445 07/04/20 1500  BP: 116/64 (!) 109/53 (!) 117/57 (!) 103/49  Pulse:    (!) 34  Resp: 15 14 19  (!) 23  Temp:      TempSrc:      SpO2:      Weight:      Height:         General:  Appears calm and comfortable and is in NAD Eyes:  PERRL, EOMI, normal lids, iris ENT:   grossly normal hearing, lips & tongue, mmm; dentures Neck:  no LAD, masses or thyromegaly; no carotid bruits Cardiovascular:  RRR, no m/r/g. Pitting edema in bilateral  to above ankles  Respiratory:   CTA bilaterally with no wheezes/rales/rhonchi.  Normal respiratory effort. Abdomen:  soft, NT, ND, NABS Back:   normal alignment, no CVAT Skin:  no rash or induration seen on limited exam. Fistula wnl  Musculoskeletal:  grossly normal tone BUE/BLE, good ROM, no bony abnormality Lower extremity:  Limited foot exam with no ulcerations.  2+ distal pulses. Psychiatric:  grossly normal mood and affect, speech fluent and appropriate, AOx3 Neurologic:  CN 2-12 grossly intact, moves all extremities in coordinated fashion, sensation intact    Radiological Exams on Admission: Independently reviewed - see discussion in A/P where applicable  No results found.  EKG: Independently reviewed.  Sinus tachy with rate 105; nonspecific ST changes with no evidence of acute ischemia   Labs on Admission: I have personally reviewed the available labs and imaging studies at the time of the admission.  Pertinent labs:  Fecal occult: positive Hgb: 6.6 Glucose: 253 Creatinine: 7.06/BUN: 35  Assessment/Plan  Acute blood loss anemia -hemoglobin of 6.6, baseline appears to be around 7-8 -Likely secondary to GI bleed with BRBPR and positive fecal occult -Discussed with nephrology and will start with 1 unit of blood  then repeat H&H. if needed we can give a second unit -trend H&H -History of GI bleeds in the past -Last colonoscopy 02/03/19 with cauterization of cecal angiodysplasia at [facility]. > egd showed gastric avm s/p apc on 1/8 Gi capsule study: 04/01/19    GIB (gastrointestinal bleeding) -GI consulted. Discussed with  [facility] on-call who recommended I call Dr[facility] Cristina G[facility] since he last saw him in the hospital. discussed with Dr. Cristina Gong who said one of them will see him in the a.m. okay with liquid  diet tonight then n.p.o. after midnight.   -also started Protonix with history of gastric AVM -trend H&H q 4 hours  Syncope -Secondary to anemia however checking echo and keeping him on telemetry -Did not hit his head and no changes in mental status -orthostatics pending     Hypertension -continue home medication  Blood pressures have been reasonably well controlled    Hypercholesteremia -continue statin.  -fasting lipid panel recently done 05/2020. LDL 122.  -would optimize management outpatient for LDL <70    ESRD (end stage renal disease) on dialysis [facility]) -Nephrology consulted missed his dialysis day today.  Will get dialysis  tonight -No evidence of fluid overload and labs are stable -Add on mag and Phos  CAD  S/p PCI 07/2018 on plavix.  Recently admitted for unstable angina status postcardiac cath on 5/13.  Did not want to undergo CABG and recommended medical management. -hold plavix/ASA with acute bleed.  -continue tele  -Continue statin and isosorbide     Anemia of chronic disease -Baseline appears to be around 7-8 -Transfusing 1 unit due to acute blood loss anemia    Type II diabetes mellitus with renal manifestations (HCC) -Recent A1c of 7.9 -Sliding scale insulin -Continue home dose of 70/30 of 20 units twice daily    Chronic systolic CHF (congestive heart failure) (HCC) -Strict I's and O's -Daily weights -Last echo March 18, 2019 with EF of 60 to 65%.  Grade 1 diastolic dysfunction.  Elevated left ventricular end-diastolic pressure -repeat echo  -no clinical evidence of volume overload and receiving dialysis tonight    Body mass index is 24.02 kg/m.  Note: This patient has been tested and is negative for the novel coronavirus COVID-19.    Level of care: Med-Surg DVT prophylaxis:   SCDs Code Status:  Full - confirmed with patient Family Communication:  wife at bedside,  Zoe Lan Disposition Plan:  The patient is from: home    Patient is  currently: acutely ill Consults called: nephrology, Dr. Jonnie Finner and GI: Dr. Cristina Gong  Admission status:  inpatient    Orma Flaming MD [facility]   How to contact the [facility] Attending or Consulting provider [place] or covering provider during after hours [place], for this patient?  Check the care team in [facility] and look for a) attending/consulting [facility] provider listed and b) the [facility] team listed Log into [email or web address] and use [place]'s universal password to access. If you do not have the password, please contact the hospital operator. Locate the [facility] provider you are looking for under [facility] and page to a number that you can be directly reached. If you still have difficulty reaching the provider, please page the [facility] (Director on Call) for the Hospitalists listed on amion for assistance.   07/04/2020, 5:03 PM

## 2020-07-04 NOTE — ED Provider Notes (Signed)
[facility] EMERGENCY DEPARTMENT Provider Note   CSN: [id number] Arrival date & time: 07/04/20  8657     History Chief Complaint  Patient presents with   Loss of Consciousness   Rectal Bleeding    Nicholas [name] is a 87 y.o. male with a past medical history of end-stage renal disease, previous GI bleed, diabetes on Plavix who presents emergency department with GI bleed and loss of consciousness.  Patient goes to dialysis Tuesday Thursday Saturday.  He awoke this morning around 4 AM and had a bowel movement.  He went back to sleep, woke back up and had urgency to defecate which is unusual.  When he went to the bathroom he passed a very large bloody stool.  Patient had a third bowel movement that was less large and did not appear bloody however he had blood on his toilet paper.  Patient states that he later got up to get ready for dialysis and started feeling extremely nauseated.  He began retching but did not vomit.  He states he was sitting in a chair in his bathroom and the next thing he knows his son was applying cold compresses to his forehead and his wife had called 911.  He apparently lost consciousness but had no idea that he had passed out.  He continues to feel what he describes as uncomfortable in his abdomen.  He denies any significant pain.  He has no nausea at this time.  He denies feeling lightheaded at this time.  The history is provided by the patient.  Loss of Consciousness Episode history:  Single Most recent episode:  Today Duration: unknown. Progression:  Resolved Chronicity:  Recurrent Context comment:  GI bleeding Witnessed: yes   Worsened by:  Nothing Ineffective treatments:  None tried Associated symptoms: rectal bleeding   Rectal Bleeding Quality:  Black and tarry and bright red Amount:  Copious Duration:  1 day Chronicity:  Recurrent Context: not anal fissures, not anal penetration, not constipation and not foreign body   Associated  symptoms: no abdominal pain   Risk factors: anticoagulant use       Past Medical History:  Diagnosis Date   Arthritis    Chronic kidney disease    Diabetes mellitus ([place])    Dialysis patient ([place])    GERD (gastroesophageal reflux disease)    occ   Heart murmur    Hypercholesteremia    Hypertension    Psoriasis     Patient Active Problem List   Diagnosis Date Noted   Left renal mass 06/06/2020   Chest pain 06/05/2020   Hypotension    Syncope and collapse 03/17/2019   Acute anemia 09/28/2018   GIB (gastrointestinal bleeding) 09/27/2018   Anemia due to blood loss 09/27/2018   Type II diabetes mellitus with renal manifestations ([place]) 09/27/2018   GERD (gastroesophageal reflux disease) 09/27/2018   CAD (coronary artery disease) 84/69/6295   Chronic systolic CHF (congestive heart failure) ([place]) 09/27/2018   NSTEMI (non-ST elevated myocardial infarction) ([place]) 08/06/2018   Diabetes mellitus ([place])    Hypertension    Hypercholesteremia    ESRD (end stage renal disease) on dialysis ([place])    Anemia of chronic disease    Lung nodule seen on imaging study 04/30/2012    Past Surgical History:  Procedure Laterality Date   CARDIAC CATHETERIZATION  09   "some blockage" with collaterals by cath at [facility] ~ 2009; no intervention required; reportedly, no routine cardiology f/u recommended    CORONARY ATHERECTOMY N/A  08/09/2018   Procedure: CORONARY ATHERECTOMY;  Surgeon: Jettie Booze, MD;  Location: [facility];  Service: Cardiovascular;  Laterality: N/A;   CORONARY STENT INTERVENTION N/A 08/09/2018   Procedure: CORONARY STENT INTERVENTION;  Surgeon: Jettie Booze, MD;  Location: [facility];  Service: Cardiovascular;  Laterality: N/A;   INGUINAL HERNIA REPAIR Right 04/13/2012   Procedure: HERNIA REPAIR INGUINAL INCARCERATED;  Surgeon: Gayland Curry, MD;  Location: [facility];  Service: General;  Laterality: Right;   INSERTION OF MESH Right 04/13/2012   Procedure:  INSERTION OF MESH;  Surgeon: Gayland Curry, MD;  Location: [facility];  Service: General;  Laterality: Right;   IR THORACENTESIS ASP PLEURAL SPACE W/IMG GUIDE  06/08/2020   LEFT HEART CATH N/A 08/09/2018   Procedure: Left Heart Cath;  Surgeon: Jettie Booze, MD;  Location: [place] [facility];  Service: Cardiovascular;  Laterality: N/A;   LEFT HEART CATH AND CORONARY ANGIOGRAPHY N/A 08/08/2018   Procedure: LEFT HEART CATH AND CORONARY ANGIOGRAPHY;  Surgeon: Troy Sine, MD;  Location: [facility];  Service: Cardiovascular;  Laterality: N/A;   LEFT HEART CATH AND CORONARY ANGIOGRAPHY N/A 06/05/2020   Procedure: LEFT HEART CATH AND CORONARY ANGIOGRAPHY;  Surgeon: Belva Crome, MD;  Location: [facility];  Service: Cardiovascular;  Laterality: N/A;   VASECTOMY         Family History  Problem Relation Age of Onset   Diabetes Mother    Stroke Father    Diabetes Sister    Diabetes Brother     Social History   Tobacco Use   Smoking status: Former    Packs/day: 1.00    Years: 15.00    Pack years: 15.00    Types: Cigarettes    Quit date: 03/29/1987    Years since quitting: 33.2   Smokeless tobacco: Never  Substance Use Topics   Alcohol use: No   Drug use: No    Home Medications Prior to Admission medications   Medication Sig Start Date End Date Taking? Authorizing Provider  acetaminophen (TYLENOL) 500 MG tablet Take 500 mg by mouth 3 (three) times daily as needed for moderate pain.   Yes [provider]  atorvastatin (LIPITOR) 80 MG tablet Take 80 mg by mouth at bedtime.   Yes [provider]  baclofen (LIORESAL) 10 MG tablet Take 5 mg by mouth at bedtime as needed for muscle spasms.   Yes [provider]  clopidogrel (PLAVIX) 75 MG tablet Take 1 tablet (75 mg total) by mouth daily with breakfast. 08/11/18  Yes [name], MD  diphenhydrAMINE (SOMINEX) 25 MG tablet Take 25 mg by mouth at bedtime as needed for itching.   Yes  [provider]  folic acid-vitamin b complex-vitamin c-selenium-zinc (DIALYVITE) 3 MG TABS tablet Take 1 tablet by mouth daily.   Yes [provider]  insulin NPH-regular Human (70-30) 100 UNIT/ML injection Inject 20 Units into the skin 2 (two) times daily with a meal.   Yes [provider]  isosorbide dinitrate (ISORDIL) 20 MG tablet Take 1 tablet (20 mg total) by mouth 2 (two) times daily. 06/08/20  Yes Kathie Dike, MD  lidocaine-prilocaine (EMLA) cream Apply 1 application topically See admin instructions. Apply topically 30 minutes prior to dialysis on Tuesday, Thursday, Saturday 11/15/18  Yes [provider]  methocarbamol (ROBAXIN) 500 MG tablet Take 500 mg by mouth 3 (three) times daily. 06/19/20  Yes [provider]  Multiple Vitamin (  MULTIVITAMIN WITH MINERALS) TABS tablet Take 1 tablet by mouth daily.   Yes [provider]  nitroGLYCERIN (NITROSTAT) 0.4 MG SL tablet Place 1 tablet (0.4 mg total) under the tongue every 5 (five) minutes x 3 doses as needed for chest pain. 08/10/18  Yes [name], MD  Nutritional Supplements (NEPRO/CARBSTEADY PO) Take 237 mLs by mouth daily. vanilla   Yes [provider]  sevelamer carbonate (RENVELA) 800 MG tablet Take 1,600 mg by mouth 3 (three) times daily. 05/22/19  Yes [provider]    Allergies    Hydrochlorothiazide  Review of Systems   Review of Systems  Cardiovascular:  Positive for syncope.  Gastrointestinal:  Positive for hematochezia. Negative for abdominal pain.  Ten systems reviewed and are negative for acute change, except as noted in the HPI.   Physical Exam Updated Vital Signs BP (!) 103/49   Pulse (!) 34   Temp (!) 97.5 F (36.4 C) (Oral)   Resp (!) 23   Ht 5\' 8"  (1.727 m)   Wt 71.7 kg   SpO2 100%   BMI 24.02 kg/m   Physical Exam Vitals and nursing note reviewed.  Constitutional:      General: He is not in acute distress.    Appearance:  He is well-developed. He is not diaphoretic.  HENT:     Head: Normocephalic and atraumatic.  Eyes:     General: No scleral icterus.    Extraocular Movements: Extraocular movements intact.     Conjunctiva/sclera: Conjunctivae normal.     Pupils: Pupils are equal, round, and reactive to light.  Cardiovascular:     Rate and Rhythm: Normal rate and regular rhythm.     Heart sounds: Normal heart sounds.  Pulmonary:     Effort: Pulmonary effort is normal. No respiratory distress.     Breath sounds: Normal breath sounds.  Abdominal:     Palpations: Abdomen is soft.     Tenderness: There is no abdominal tenderness.  Genitourinary:    Comments: Digital Rectal Exam reveals sphincter with good tone. No external hemorrhoids. No masses or fissures. Stool color is maroon. Musculoskeletal:     Cervical back: Normal range of motion and neck supple.  Skin:    General: Skin is warm and dry.  Neurological:     Mental Status: He is alert.  Psychiatric:        Behavior: Behavior normal.    ED Results / Procedures / Treatments   Labs (all labs ordered are listed, but only abnormal results are displayed) Labs Reviewed  COMPREHENSIVE METABOLIC PANEL - Abnormal; Notable for the following components:      Result Value   Chloride 96 (*)    Glucose, Bld 253 (*)    BUN 35 (*)    Creatinine, Ser 7.06 (*)    Total Protein 6.4 (*)    Albumin 2.7 (*)    AST 11 (*)    GFR, Estimated 7 (*)    All other components within normal limits  CBC - Abnormal; Notable for the following components:   WBC 11.4 (*)    RBC 2.27 (*)    Hemoglobin 6.6 (*)    HCT 21.7 (*)    All other components within normal limits  POC OCCULT BLOOD, ED - Abnormal; Notable for the following components:   Fecal Occult Bld POSITIVE (*)    All other components within normal limits  RESP PANEL BY RT-PCR (FLU A&B, COVID) ARPGX2  LIPASE, BLOOD  TYPE AND SCREEN  EKG None  Radiology No results found.  Procedures .Critical  Care  Date/Time: 07/04/2020 3:44 PM Performed by: Margarita Mail, PA-C Authorized by: Margarita Mail, PA-C   Critical care provider statement:    Critical care time (minutes):  45   Critical care time was exclusive of:  Separately billable procedures and treating other patients   Critical care was necessary to treat or prevent imminent or life-threatening deterioration of the following conditions:  Circulatory failure   Critical care was time spent personally by me on the following activities:  Discussions with consultants, evaluation of patient's response to treatment, examination of patient, ordering and performing treatments and interventions, ordering and review of laboratory studies, ordering and review of radiographic studies, pulse oximetry, re-evaluation of patient's condition, obtaining history from patient or surrogate and review of old charts   Care discussed with: admitting provider   {  Medications Ordered in ED Medications  pantoprazole (PROTONIX) injection 40 mg (40 mg Intravenous Given 07/04/20 1351)    ED Course  I have reviewed the triage vital signs and the nursing notes.  Pertinent labs & imaging results that were available during my care of the patient were reviewed by me and considered in my medical decision making (see chart for details).    MDM Rules/Calculators/A&P                          [id number] VS:  Vitals:   07/04/20 1315 07/04/20 1400 07/04/20 1445 07/04/20 1500  BP: 116/64 (!) 109/53 (!) 117/57 (!) 103/49  Pulse:    (!) 34  Resp: 15 14 19  (!) 23  Temp:      TempSrc:      SpO2:      Weight:      Height:        JS:[id number] is gathered by patient and emr. Previous records obtained and reviewed. DDX:The patient's complaint of syncope involves an extensive number of diagnostic and treatment options, and is a complaint that carries with it a high risk of complications, morbidity, and potential mortality. Given the large differential diagnosis,  medical decision making is of high complexity. The differential for syncope is extensive and includes, but is not limited to: arrythmia (Vtach, SVT, SSS, sinus arrest, AV block, bradycardia) aortic stenosis, AMI, HOCM, PE, atrial myxoma, pulmonary hypertension, orthostatic hypotension, (hypovolemia, drug effect, GB syndrome, micturition, cough, swall) carotid sinus sensitivity, Seizure, TIA/CVA, hypoglycemia,  Vertigo.  Labs: I ordered reviewed and interpreted labs which included\a CBC which shows hemoglobin of 6.6 down about a gram and a half for the past 3 weeks.  CMP shows elevated blood glucose of 253, creatinine of 7.06 and BUN of 35 consistent with history of end-stage renal disease.  Lipase within normal limits, respiratory panel is pending.  Point-of-care occult stool is positive with frank bloody stool on exam. Imaging:  EKG: Sinus tachycardia at a rate of 105 Consults: Dr. Eliberto Ivory of [facility] MDM: Patient here with syncope, GI bleed.  He has been stable here in the emergency department.  Will need admission.  Case discussed with [facility] for admission. Patient disposition:The patient appears reasonably stabilized for admission considering the current resources, flow, and capabilities available in the ED at this time, and I doubt any other [facility] requiring further screening and/or treatment in the ED prior to admission.          Final Clinical Impression(s) / ED Diagnoses Final diagnoses:  Gastrointestinal hemorrhage, unspecified gastrointestinal hemorrhage type  Syncope,  unspecified syncope type    Rx / DC Orders ED Discharge Orders     None        Margarita Mail, PA-C 07/04/20 1552    Orpah Greek, MD 07/04/20 1609

## 2020-07-04 NOTE — ED Notes (Signed)
Pa Gibbons notified of hgb of 6.6

## 2020-07-04 NOTE — ED Triage Notes (Signed)
Pt arrives via EMS from home with rectal bleeding and a syncopal episode witnessed by family. Due to HD today. Pt is on plavix.

## 2020-07-04 NOTE — Progress Notes (Signed)
NEW ADMISSION NOTE New Admission Note:   Arrival Method: E.D stretcher bed Mental Orientation: Alert and oriented x 4 Telemetry:#13 NSR Assessment: Completed Skin:Small dryscab over wound at the top middle back, assessed with Candice R.N. QZ:YTMMI FA. Pain:Denies Tubes:None Safety Measures: Safety Fall Prevention Plan has been given, discussed and signed Admission: Completed [facility] Orientation: Patient has been orientated to the room, unit and staff.  Family:Wife at bedside,made aware of visitation policy.  Orders have been reviewed and implemented. Will continue to monitor the patient. Call light has been placed within reach and bed alarm has been activated.   [place], Zenon Mayo, RN

## 2020-07-04 NOTE — Consult Note (Signed)
Renal Service Consult Note [facility]  Nicholas [name] 07/04/2020 [name], MD Requesting Physician: Dr. Rogers Blocker, A.   Reason for Consult: ESRD pt w/ GIB HPI: The patient is a 87 y.o. year-old w/ hx of DJD, IDDM, HL, HTN and ESRD on HD TTS in [place]. Pt presented w/ rectal bleeding and syncopal episode. In ED BP's 110/ 80 range, no shock, K 4.8, BUN 25, alb 2.7, Hb 6.6, wbc 11K. Pt is being admitted for GIB. Asked to see for dialysis.   Last HD was Thursday, has not missed. Lives w/ wife and son at home. No recent HD issues. Has LFA fistula. No recent SOB, leg swelling, orthopnea.     ROS  denies CP  no joint pain   no HA  no blurry vision  no rash  no diarrhea  no nausea/ vomiting   Past Medical History  Past Medical History:  Diagnosis Date   Arthritis    Chronic kidney disease    Diabetes mellitus ([place])    Dialysis patient ([place])    GERD (gastroesophageal reflux disease)    occ   Heart murmur    Hypercholesteremia    Hypertension    Psoriasis    Past Surgical History  Past Surgical History:  Procedure Laterality Date   CARDIAC CATHETERIZATION  09   "some blockage" with collaterals by cath at [facility] ~ 2009; no intervention required; reportedly, no routine cardiology f/u recommended    CORONARY ATHERECTOMY N/A 08/09/2018   Procedure: CORONARY ATHERECTOMY;  Surgeon: Jettie Booze, MD;  Location: [place] [facility];  Service: Cardiovascular;  Laterality: N/A;   CORONARY STENT INTERVENTION N/A 08/09/2018   Procedure: CORONARY STENT INTERVENTION;  Surgeon: Jettie Booze, MD;  Location: [place] [facility];  Service: Cardiovascular;  Laterality: N/A;   INGUINAL HERNIA REPAIR Right 04/13/2012   Procedure: HERNIA REPAIR INGUINAL INCARCERATED;  Surgeon: Gayland Curry, MD;  Location: [facility];  Service: General;  Laterality: Right;   INSERTION OF MESH Right 04/13/2012   Procedure: INSERTION OF MESH;  Surgeon: Gayland Curry, MD;  Location: [facility];   Service: General;  Laterality: Right;   IR THORACENTESIS ASP PLEURAL SPACE W/IMG GUIDE  06/08/2020   LEFT HEART CATH N/A 08/09/2018   Procedure: Left Heart Cath;  Surgeon: Jettie Booze, MD;  Location: [facility];  Service: Cardiovascular;  Laterality: N/A;   LEFT HEART CATH AND CORONARY ANGIOGRAPHY N/A 08/08/2018   Procedure: LEFT HEART CATH AND CORONARY ANGIOGRAPHY;  Surgeon: Troy Sine, MD;  Location: [place] [facility];  Service: Cardiovascular;  Laterality: N/A;   LEFT HEART CATH AND CORONARY ANGIOGRAPHY N/A 06/05/2020   Procedure: LEFT HEART CATH AND CORONARY ANGIOGRAPHY;  Surgeon: Belva Crome, MD;  Location: [place] [facility];  Service: Cardiovascular;  Laterality: N/A;   VASECTOMY     Family History  Family History  Problem Relation Age of Onset   Diabetes Mother    Stroke Father    Diabetes Sister    Diabetes Brother    Social History  reports that he quit smoking about 33 years ago. His smoking use included cigarettes. He has a 15.00 pack-year smoking history. He has never used smokeless tobacco. He reports that he does not drink alcohol and does not use drugs. Allergies  Allergies  Allergen Reactions   Hydrochlorothiazide Other (See Comments)    [facility]   Home medications Prior to Admission medications   Medication Sig Start  Date End Date Taking? Authorizing Provider  acetaminophen (TYLENOL) 500 MG tablet Take 500 mg by mouth 3 (three) times daily as needed for moderate pain.   Yes [provider]  atorvastatin (LIPITOR) 80 MG tablet Take 80 mg by mouth at bedtime.   Yes [provider]  baclofen (LIORESAL) 10 MG tablet Take 5 mg by mouth at bedtime as needed for muscle spasms.   Yes [provider]  clopidogrel (PLAVIX) 75 MG tablet Take 1 tablet (75 mg total) by mouth daily with breakfast. 08/11/18  Yes [name], MD  diphenhydrAMINE (SOMINEX) 25 MG tablet Take 25 mg by mouth at bedtime as needed for  itching.   Yes [provider]  folic acid-vitamin b complex-vitamin c-selenium-zinc (DIALYVITE) 3 MG TABS tablet Take 1 tablet by mouth daily.   Yes [provider]  insulin NPH-regular Human (70-30) 100 UNIT/ML injection Inject 20 Units into the skin 2 (two) times daily with a meal.   Yes [provider]  isosorbide dinitrate (ISORDIL) 20 MG tablet Take 1 tablet (20 mg total) by mouth 2 (two) times daily. 06/08/20  Yes Kathie Dike, MD  lidocaine-prilocaine (EMLA) cream Apply 1 application topically See admin instructions. Apply topically 30 minutes prior to dialysis on Tuesday, Thursday, Saturday 11/15/18  Yes [provider]  methocarbamol (ROBAXIN) 500 MG tablet Take 500 mg by mouth 3 (three) times daily. 06/19/20  Yes [provider]  Multiple Vitamin (MULTIVITAMIN WITH MINERALS) TABS tablet Take 1 tablet by mouth daily.   Yes [provider]  nitroGLYCERIN (NITROSTAT) 0.4 MG SL tablet Place 1 tablet (0.4 mg total) under the tongue every 5 (five) minutes x 3 doses as needed for chest pain. 08/10/18  Yes [name], MD  Nutritional Supplements (NEPRO/CARBSTEADY PO) Take 237 mLs by mouth daily. vanilla   Yes [provider]  sevelamer carbonate (RENVELA) 800 MG tablet Take 1,600 mg by mouth 3 (three) times daily. 05/22/19  Yes [provider]     Vitals:   07/04/20 1315 07/04/20 1400 07/04/20 1445 07/04/20 1500  BP: 116/64 (!) 109/53 (!) 117/57 (!) 103/49  Pulse:    (!) 34  Resp: 15 14 19  (!) 23  Temp:      TempSrc:      SpO2:      Weight:      Height:       Exam Gen alert, no distress No rash, cyanosis or gangrene Sclera anicteric, throat clear  No jvd or bruits Chest clear bilat to bases, no rales/ wheezing RRR no MRG Abd soft ntnd no mass or ascites +bs GU normal male MS no joint effusions or deformity Ext no LE or UE edema, no wounds or ulcers Neuro is alert, Ox 3 , nf  LFA AVF +bruit         Home meds:  - plavix/ lipitor/ sl ntg prn/ iordil 20 tid  - 70/30 insulin 20 bid  - renvela 1600 tid/ dialyvite qd/ nepro qd  - prn baclofen 5 hs/ robaxin prn 500 tid  - prn's/ vitamins/ supplements     OP HD: TTS [facility], dry wt 73kg in May     Assessment/ Plan: GI bleed - presented w/ rectal bleeding and syncopal episode. Stable in ED, Hb 6.8. 2u prbc's ordered.  ESRD - on HD TTS. Has not missed HD.  BP/ volume - B{'s normal here, not on any BP lowering meds at home. No sx's of vol excess on exam. 1- 2L  goal w/ HD tonight.  CAD - extensive hx of CAD, had PCI 07/2018. Had [place] here during admit May 2022, no new intervention, medical Rx.  MBD ckd - cont binder (not sure which one per pt) Anemia ckd - get records Monday from HD unit      Kelly Splinter  MD 07/04/2020, 5:45 PM  Recent Labs  Lab 07/04/20 1000  WBC 11.4*  HGB 6.6*   Recent Labs  Lab 07/04/20 1000  K 4.8  BUN 35*  CREATININE 7.06*  CALCIUM 9.3

## 2020-07-05 ENCOUNTER — Inpatient Hospital Stay (HOSPITAL_COMMUNITY): Payer: Medicare Other

## 2020-07-05 ENCOUNTER — Other Ambulatory Visit (HOSPITAL_COMMUNITY): Payer: Medicare Other

## 2020-07-05 DIAGNOSIS — D62 Acute posthemorrhagic anemia: Secondary | ICD-10-CM

## 2020-07-05 DIAGNOSIS — R0602 Shortness of breath: Secondary | ICD-10-CM

## 2020-07-05 LAB — ECHOCARDIOGRAM COMPLETE
Area-P 1/2: 3.66 cm2
Height: 68 in
S' Lateral: 3.4 cm
Weight: 2486.79 oz

## 2020-07-05 LAB — GLUCOSE, CAPILLARY
Glucose-Capillary: 119 mg/dL — ABNORMAL HIGH (ref 70–99)
Glucose-Capillary: 186 mg/dL — ABNORMAL HIGH (ref 70–99)
Glucose-Capillary: 118 mg/dL — ABNORMAL HIGH (ref 70–99)
Glucose-Capillary: 149 mg/dL — ABNORMAL HIGH (ref 70–99)

## 2020-07-05 LAB — HEMOGLOBIN AND HEMATOCRIT, BLOOD
HCT: 19.6 % — ABNORMAL LOW (ref 39.0–52.0)
Hemoglobin: 6.3 g/dL — CL (ref 13.0–17.0)

## 2020-07-05 MED ORDER — PEG 3350-KCL-NA BICARB-NACL 420 G PO SOLR
4000.0000 mL | Freq: Once | ORAL | Status: AC
  Administered 2020-07-05: 4000 mL via ORAL
  Filled 2020-07-05: qty 4000

## 2020-07-05 MED ORDER — INSULIN ASPART 100 UNIT/ML IJ SOLN: 0.0000 [IU] | Freq: Every day | INTRAMUSCULAR | Status: DC

## 2020-07-05 MED ORDER — INSULIN ASPART 100 UNIT/ML IJ SOLN
0.0000 [IU] | Freq: Three times a day (TID) | INTRAMUSCULAR | Status: DC
  Administered 2020-07-07 – 2020-07-08 (×2): 1 [IU] via SUBCUTANEOUS

## 2020-07-05 MED ORDER — SODIUM CHLORIDE 0.9% IV SOLUTION: Freq: Once | INTRAVENOUS | Status: AC

## 2020-07-05 NOTE — Plan of Care (Signed)
  Problem: Clinical Measurements: Goal: Diagnostic test results will improve Outcome: Progressing   

## 2020-07-05 NOTE — Progress Notes (Signed)
Dr, Jonnie Finner made aware of the 1 unit prbc prescribed w/dialysis  was not administered .  For more informations, please contact Dr. Jonnie Finner.

## 2020-07-05 NOTE — Progress Notes (Addendum)
[facility] Progress Note  Subjective: no problems overnight, no BM since admission , no c/o, had HD early am today  Vitals:   07/05/20 0202 07/05/20 0217 07/05/20 0232 07/05/20 0247  BP: (!) 100/55 (!) 91/58 (!) 106/58 (!) 106/52  Pulse: 88 89 94 93  Resp: (!) 25 (!) 25 (!) 23 15  Temp:    97.7 F (36.5 C)  TempSrc:    Oral  SpO2: 100% 100% 100% 100%  Weight:      Height:        Exam:  alert, nad   no jvd  Chest cta bilat  Cor reg no RG  Abd soft ntnd no ascites   Ext no LE edema   Alert, NF, ox3  LFA AVF+bruit     Home meds:  - plavix/ lipitor/ sl ntg prn/ isordil 20 tid  - 70/30 insulin 20 bid  - renvela 1600 tid/ dialyvite qd/ nepro qd  - prn baclofen 5 hs/ robaxin prn 500 tid  - prn's/ vitamins/ supplements        OP HD: [facility], dry wt 73kg in May       Assessment/ Plan: GI bleed - presented w/ rectal bleeding and syncopal episode. Admit Hb 6.6. Repeat this am down to 6.3.  No further rectal bleeding here per pt.  ESRD - on HD TTS. Has not missed HD. Had HD early this am. Next HD 6/14.  BP/ volume - BP's low-normal here, not on any BP lowering meds at home. Min UF on HD overnight.  No vol^ on exam.  CAD - extensive hx of CAD, had PCI 07/2018. Had [place] here during admit May 2022, no new intervention, medical Rx. MBD ckd - cont binder (not sure which one per pt) Anemia ckd - get records Monday from HD unit        Kelly Splinter 07/05/2020, 7:34 AM   Recent Labs  Lab 07/04/20 1000 07/04/20 1825 07/05/20 0455  K 4.8  --   --   BUN 35*  --   --   CREATININE 7.06*  --   --   CALCIUM 9.3  --   --   PHOS  --  4.6  --   HGB 6.6*  --  6.3*   Inpatient medications:  sodium chloride   Intravenous Once   atorvastatin  80 mg Oral QHS   Chlorhexidine Gluconate Cloth  6 each Topical Q0600   insulin aspart protamine- aspart  20 Units Subcutaneous BID WC   isosorbide dinitrate  20 mg Oral BID   [START ON 07/07/2020]  lidocaine-prilocaine  1 application Topical Q T,Th,Sa-HD   methocarbamol  500 mg Oral TID   multivitamin  1 tablet Oral Daily   pantoprazole  40 mg Oral Daily   sevelamer carbonate  1,600 mg Oral TID WC   sodium chloride flush  3 mL Intravenous Q12H    sodium chloride     sodium chloride, acetaminophen **OR** acetaminophen, nitroGLYCERIN, ondansetron **OR** ondansetron (ZOFRAN) IV, sodium chloride flush

## 2020-07-05 NOTE — Consult Note (Signed)
Referring Provider: [facility] Primary Care Physician:  [facility] Primary Gastroenterologist:  Dr. Cannon Kettle  Reason for Consultation: GI bleeding and severe anemia  HPI: Nicholas [name] is a 87 y.o. male dialysis patient on Plavix, approximately 2 years status post PCI with normal LV function (EF 60 to 65%), 1 month status post hospitalization for chest pain associated with acute on chronic anemia (hemoglobin 5.7, transfused 1 unit, no evidence of GI bleeding at the time).    Yesterday, he had recurrent hematochezia and transient syncope and in the emergency room, was found to have maroon stool and a hemoglobin of 6.6 (subsequently fell to 6.3) as compared to a discharge hemoglobin 3 weeks earlier of 8.0.  He was dialyzed early this morning and has been transfused 1 unit of packed cells today.  The patient does not have significant GI symptoms.  Specifically, no dysphagia, heartburn, or abdominal pain.  Slight nausea, moderate loss of appetite and loss of weight since going on dialysis 3-1/2 years ago.  No constipation.  Colonoscopy in December 2016 showed pancolonic diverticulosis, with a normal endoscopy at that time.  From review of the patient's admitting note, I see where more recently, in January 2021, he had colonoscopy (I believe in [facility]) with cauterization of a bleeding cecal AVM, also with gastric AVMs treated with argon plasma coagulation around that time, also apparently a GI capsule study in March 2021.  The patient was recently started on chronic home oxygen, although he is not clearly oxygen dependent (for example, sometimes the oxygen cannula will fall off and he does not even notice it).  The patient's last dose of Plavix was 2 days ago.   Past Medical History:  Diagnosis Date   Arthritis    Chronic kidney disease    Diabetes mellitus ([place])    Dialysis patient ([facility])    GERD (gastroesophageal reflux disease)    occ   Heart murmur     Hypercholesteremia    Hypertension    Psoriasis     Past Surgical History:  Procedure Laterality Date   CARDIAC CATHETERIZATION  09   "some blockage" with collaterals by cath at [facility] ~ 2009; no intervention required; reportedly, no routine cardiology f/u recommended    CORONARY ATHERECTOMY N/A 08/09/2018   Procedure: CORONARY ATHERECTOMY;  Surgeon: Jettie Booze, MD;  Location: [place] [facility];  Service: Cardiovascular;  Laterality: N/A;   CORONARY STENT INTERVENTION N/A 08/09/2018   Procedure: CORONARY STENT INTERVENTION;  Surgeon: Jettie Booze, MD;  Location: [place] [facility];  Service: Cardiovascular;  Laterality: N/A;   INGUINAL HERNIA REPAIR Right 04/13/2012   Procedure: HERNIA REPAIR INGUINAL INCARCERATED;  Surgeon: Gayland Curry, MD;  Location: [place];  Service: General;  Laterality: Right;   INSERTION OF MESH Right 04/13/2012   Procedure: INSERTION OF MESH;  Surgeon: Gayland Curry, MD;  Location: [place];  Service: General;  Laterality: Right;   IR THORACENTESIS ASP PLEURAL SPACE W/IMG GUIDE  06/08/2020   LEFT HEART CATH N/A 08/09/2018   Procedure: Left Heart Cath;  Surgeon: Jettie Booze, MD;  Location: [place] [facility];  Service: Cardiovascular;  Laterality: N/A;   LEFT HEART CATH AND CORONARY ANGIOGRAPHY N/A 08/08/2018   Procedure: LEFT HEART CATH AND CORONARY ANGIOGRAPHY;  Surgeon: Troy Sine, MD;  Location: [place] [facility];  Service: Cardiovascular;  Laterality: N/A;   LEFT HEART CATH AND CORONARY ANGIOGRAPHY N/A 06/05/2020   Procedure: LEFT HEART CATH AND CORONARY  ANGIOGRAPHY;  Surgeon: Belva Crome, MD;  Location: [place] [facility];  Service: Cardiovascular;  Laterality: N/A;   VASECTOMY      Prior to Admission medications   Medication Sig Start Date End Date Taking? Authorizing Provider  acetaminophen (TYLENOL) 500 MG tablet Take 500 mg by mouth 3 (three) times daily as needed for moderate pain.   Yes [provider]  atorvastatin  (LIPITOR) 80 MG tablet Take 80 mg by mouth at bedtime.   Yes [provider]  baclofen (LIORESAL) 10 MG tablet Take 5 mg by mouth at bedtime as needed for muscle spasms.   Yes [provider]  clopidogrel (PLAVIX) 75 MG tablet Take 1 tablet (75 mg total) by mouth daily with breakfast. 08/11/18  Yes [name], MD  diphenhydrAMINE (SOMINEX) 25 MG tablet Take 25 mg by mouth at bedtime as needed for itching.   Yes [provider]  folic acid-vitamin b complex-vitamin c-selenium-zinc (DIALYVITE) 3 MG TABS tablet Take 1 tablet by mouth daily.   Yes [provider]  insulin NPH-regular Human (70-30) 100 UNIT/ML injection Inject 20 Units into the skin 2 (two) times daily with a meal.   Yes [provider]  isosorbide dinitrate (ISORDIL) 20 MG tablet Take 1 tablet (20 mg total) by mouth 2 (two) times daily. 06/08/20  Yes Kathie Dike, MD  lidocaine-prilocaine (EMLA) cream Apply 1 application topically See admin instructions. Apply topically 30 minutes prior to dialysis on Tuesday, Thursday, Saturday 11/15/18  Yes [provider]  methocarbamol (ROBAXIN) 500 MG tablet Take 500 mg by mouth 3 (three) times daily. 06/19/20  Yes [provider]  Multiple Vitamin (MULTIVITAMIN WITH MINERALS) TABS tablet Take 1 tablet by mouth daily.   Yes [provider]  nitroGLYCERIN (NITROSTAT) 0.4 MG SL tablet Place 1 tablet (0.4 mg total) under the tongue every 5 (five) minutes x 3 doses as needed for chest pain. 08/10/18  Yes [name], MD  Nutritional Supplements (NEPRO/CARBSTEADY PO) Take 237 mLs by mouth daily. vanilla   Yes [provider]  sevelamer carbonate (RENVELA) 800 MG tablet Take 1,600 mg by mouth 3 (three) times daily. 05/22/19  Yes [provider]    Current Facility-Administered Medications  Medication Dose Route Frequency Provider Last Rate Last Admin   0.9 %  sodium chloride infusion (Manually  program via Guardrails IV Fluids)   Intravenous Once Orma Flaming, MD       0.9 %  sodium chloride infusion (Manually program via Guardrails IV Fluids)   Intravenous Once Darliss Cheney, MD       0.9 %  sodium chloride infusion  250 mL Intravenous PRN Orma Flaming, MD       acetaminophen (TYLENOL) tablet 650 mg  650 mg Oral Q6H PRN Orma Flaming, MD   650 mg at 07/04/20 2106   Or   acetaminophen (TYLENOL) suppository 650 mg  650 mg Rectal Q6H PRN Orma Flaming, MD       atorvastatin (LIPITOR) tablet 80 mg  80 mg Oral QHS Orma Flaming, MD       Chlorhexidine Gluconate Cloth 2 % PADS 6 each  6 each Topical Q0600 Roney Jaffe, MD       insulin aspart (novoLOG) injection 0-5 Units  0-5 Units Subcutaneous QHS Pahwani, Ravi, MD       insulin aspart (novoLOG) injection 0-6 Units  0-6 Units Subcutaneous TID WC Pahwani, Ravi, MD       insulin aspart protamine- aspart (NOVOLOG MIX 70/30)  injection 20 Units  20 Units Subcutaneous BID WC Orma Flaming, MD       isosorbide dinitrate (ISORDIL) tablet 20 mg  20 mg Oral BID Orma Flaming, MD       [START ON 07/07/2020] lidocaine-prilocaine (EMLA) cream 1 application  1 application Topical Q T,Th,Sa-HD Orma Flaming, MD       methocarbamol (ROBAXIN) tablet 500 mg  500 mg Oral TID Orma Flaming, MD   500 mg at 07/05/20 1259   multivitamin (RENA-VIT) tablet 1 tablet  1 tablet Oral Daily Orma Flaming, MD   1 tablet at 07/05/20 1258   nitroGLYCERIN (NITROSTAT) SL tablet 0.4 mg  0.4 mg Sublingual Q5 Min x 3 PRN Orma Flaming, MD   0.4 mg at 07/04/20 2136   ondansetron (ZOFRAN) tablet 4 mg  4 mg Oral Q6H PRN Orma Flaming, MD       Or   ondansetron [facility]) injection 4 mg  4 mg Intravenous Q6H PRN Orma Flaming, MD       pantoprazole (PROTONIX) EC tablet 40 mg  40 mg Oral Daily Orma Flaming, MD   40 mg at 07/05/20 1259   polyethylene glycol-electrolytes (NuLYTELY) solution 4,000 mL  4,000 mL Oral Once Harper Smoker, Herbie Baltimore, MD       sevelamer carbonate  (RENVELA) tablet 1,600 mg  1,600 mg Oral TID WC Orma Flaming, MD       sodium chloride flush (NS) 0.9 % injection 3 mL  3 mL Intravenous Q12H Orma Flaming, MD   3 mL at 07/04/20 2116   sodium chloride flush (NS) 0.9 % injection 3 mL  3 mL Intravenous PRN Orma Flaming, MD   3 mL at 07/04/20 2114    Allergies as of 07/04/2020 - Review Complete 07/04/2020  Allergen Reaction Noted   Hydrochlorothiazide Other (See Comments) 02/21/2017    Family History  Problem Relation Age of Onset   Diabetes Mother    Stroke Father    Diabetes Sister    Diabetes Brother     Social History   Socioeconomic History   Marital status: Married    Spouse name: Not on file   Number of children: Not on file   Years of education: Not on file   Highest education level: Not on file  Occupational History   Not on file  Tobacco Use   Smoking status: Former    Packs/day: 1.00    Years: 15.00    Pack years: 15.00    Types: Cigarettes    Quit date: 03/29/1987    Years since quitting: 33.2   Smokeless tobacco: Never  Substance and Sexual Activity   Alcohol use: No   Drug use: No   Sexual activity: Not on file  Other Topics Concern   Not on file  Social History Narrative   Not on file   Social Determinants of Health   Financial Resource Strain: Not on file  Food Insecurity: Not on file  Transportation Needs: Not on file  Physical Activity: Not on file  Stress: Not on file  Social Connections: Not on file  Intimate Partner Violence: Not on file    Review of Systems: The patient does not do much physical activity.  He can only walk short distances without getting winded.  No obvious anginal symptoms.  Physical Exam: Vital signs in last 24 hours: Temp:  [97.3 F (36.3 C)-98.4 F (36.9 C)] 97.7 F (36.5 C) (06/12 1340) Pulse Rate:  [79-112] 80 (06/12 1340) Resp:  [11-25] 20 (06/12 1340)  BP: (89-129)/(48-75) 104/67 (06/12 1340) SpO2:  [98 %-100 %] 100 % (06/12 1340) Weight:  [70.5 kg]  70.5 kg (06/11 2230) Last BM Date: 07/04/20 General:   Alert,  Well-developed, well-nourished, pleasant and cooperative in NAD.  He is on nasal cannula oxygen.  He does not really look chronically ill and actually looks quite a bit younger than his age. Head:  Normocephalic and atraumatic. Eyes:  Sclera clear, no icterus.    Lungs:  Clear throughout to auscultation, although breath sounds are decreased. Heart:   Regular rate and rhythm; no murmurs, clicks, rubs,  or gallops. Abdomen: No overt tenderness Rectal: In the emergency room, showed maroon stool Msk:   Symmetrical without gross deformities.  Graft in left upper arm. Neurologic:  Alert and coherent;  grossly normal neurologically.  Seems cognitively very intact. Skin:  Intact without significant lesions or rashes. Psych:   Alert and cooperative. Normal mood and affect.  Intake/Output from previous day: 06/11 0701 - 06/12 0700 In: -  Out: 648  Intake/Output this shift: Total I/O In: 320 [Blood:320] Out: -   Lab Results: Recent Labs    07/04/20 1000 07/05/20 0455  WBC 11.4*  --   HGB 6.6* 6.3*  HCT 21.7* 19.6*  PLT 308  --    BMET Recent Labs    07/04/20 1000  NA 139  K 4.8  CL 96*  CO2 30  GLUCOSE 253*  BUN 35*  CREATININE 7.06*  CALCIUM 9.3   LFT Recent Labs    07/04/20 1000  PROT 6.4*  ALBUMIN 2.7*  AST 11*  ALT 12  ALKPHOS 61  BILITOT 0.5   PT/INR No results for input(s): LABPROT, INR in the last 72 hours.  Studies/Results: ECHOCARDIOGRAM COMPLETE  Result Date: 07/05/2020    ECHOCARDIOGRAM REPORT   Patient Name:   BRAM HOTTEL Date of Exam: 07/05/2020 Medical Rec #:  [id number]    Height:       68.0 in Accession #:    [id number]   Weight:       155.4 lb Date of Birth:  15-Oct-1930    BSA:          1.836 m Patient Age:    62 years     BP:           123/60 mmHg Patient Gender: M            HR:           79 bpm. Exam Location:  Inpatient Procedure: 2D Echo, Cardiac Doppler and Color Doppler  Indications:    Shortness of breath  History:        Patient has prior history of Echocardiogram examinations, most                 recent 03/18/2019. CHF, CAD, COPD; Risk Factors:Former Smoker.                 ESRD, GI bleed, anemia.  Sonographer:    Dustin Flock Referring Phys: [id number] [employer]  1. Left ventricular ejection fraction, by estimation, is 60 to 65%. The left ventricle has normal function. Left ventricular endocardial border not optimally defined to evaluate regional wall motion. There is mild left ventricular hypertrophy. Left ventricular diastolic parameters are consistent with Grade I diastolic dysfunction (impaired relaxation). Elevated left ventricular end-diastolic pressure.  2. Right ventricular systolic function is normal. The right ventricular size is normal. There is normal pulmonary artery systolic pressure. The estimated right ventricular systolic pressure  is 25.8 mmHg.  3. Left atrial size was mildly dilated.  4. The mitral valve is normal in structure. No evidence of mitral valve regurgitation. No evidence of mitral stenosis.  5. The aortic valve has an indeterminant number of cusps. Aortic valve regurgitation is not visualized. Mild aortic valve sclerosis is present, with no evidence of aortic valve stenosis.  6. The inferior vena cava is normal in size with greater than 50% respiratory variability, suggesting right atrial pressure of 3 mmHg. FINDINGS  Left Ventricle: Left ventricular ejection fraction, by estimation, is 60 to 65%. The left ventricle has normal function. Left ventricular endocardial border not optimally defined to evaluate regional wall motion. The left ventricular internal cavity size was normal in size. There is mild left ventricular hypertrophy. Left ventricular diastolic parameters are consistent with Grade I diastolic dysfunction (impaired relaxation). Elevated left ventricular end-diastolic pressure. Right Ventricle: The right ventricular size  is normal. No increase in right ventricular wall thickness. Right ventricular systolic function is normal. There is normal pulmonary artery systolic pressure. The tricuspid regurgitant velocity is 2.39 m/s, and  with an assumed right atrial pressure of 3 mmHg, the estimated right ventricular systolic pressure is 76.7 mmHg. Left Atrium: Left atrial size was mildly dilated. Right Atrium: Right atrial size was normal in size. Pericardium: There is no evidence of pericardial effusion. Mitral Valve: The mitral valve is normal in structure. There is mild calcification of the mitral valve leaflet(s). Mild mitral annular calcification. No evidence of mitral valve regurgitation. No evidence of mitral valve stenosis. Tricuspid Valve: The tricuspid valve is normal in structure. Tricuspid valve regurgitation is trivial. No evidence of tricuspid stenosis. Aortic Valve: The aortic valve has an indeterminant number of cusps. Aortic valve regurgitation is not visualized. Mild aortic valve sclerosis is present, with no evidence of aortic valve stenosis. Pulmonic Valve: The pulmonic valve was normal in structure. Pulmonic valve regurgitation is not visualized. No evidence of pulmonic stenosis. Aorta: The aortic root is normal in size and structure. Venous: The inferior vena cava is normal in size with greater than 50% respiratory variability, suggesting right atrial pressure of 3 mmHg. IAS/Shunts: No atrial level shunt detected by color flow Doppler.  LEFT VENTRICLE PLAX 2D LVIDd:         3.90 cm  Diastology LVIDs:         3.40 cm  LV e' medial:    3.37 cm/s LV PW:         1.20 cm  LV E/e' medial:  35.9 LV IVS:        1.20 cm  LV e' lateral:   5.00 cm/s LVOT diam:     2.20 cm  LV E/e' lateral: 24.2 LV SV:         91 LV SV Index:   49 LVOT Area:     3.80 cm  RIGHT VENTRICLE RV Basal diam:  2.40 cm RV S prime:     12.50 cm/s TAPSE (M-mode): 2.5 cm LEFT ATRIUM             Index       RIGHT ATRIUM           Index LA diam:        5.10 cm  2.78 cm/m  RA Area:     16.60 cm LA Vol (A2C):   55.2 ml 30.06 ml/m RA Volume:   42.40 ml  23.09 ml/m LA Vol (A4C):   70.6 ml 38.45 ml/m LA Biplane Vol: 64.8 ml  35.29 ml/m  AORTIC VALVE LVOT Vmax:   127.00 cm/s LVOT Vmean:  76.500 cm/s LVOT VTI:    0.239 m MITRAL VALVE                TRICUSPID VALVE MV Area (PHT): 3.66 cm     TR Peak grad:   22.8 mmHg MV Decel Time: 207 msec     TR Vmax:        239.00 cm/s MV E velocity: 121.00 cm/s MV A velocity: 165.00 cm/s  SHUNTS MV E/A ratio:  0.73         Systemic VTI:  0.24 m                             Systemic Diam: 2.20 cm Fransico Him MD Electronically signed by Fransico Him MD Signature Date/Time: 07/05/2020/1:18:10 PM    Final     Impression: 1.  GI bleeding, most likely lower tract origin, characterized by maroon stool yesterday 2.  Posthemorrhagic anemia, severe, superimposed on chronic anemia 3.  Plavix for coronary artery disease 4.  Home oxygen 5.  Dialysis 6.  History of extensive diverticular disease, and gastric and colonic AVMs treated with argon plasma coagulation therapy at an outside hospital about a year and a half ago  Plan: Endoscopy and colonoscopy tomorrow.  Purpose, risks, and limitations were discussed with the patient and his wife, who is at the bedside.  He is somewhat medically tenuous, but I think he is gone that stable for the procedure, and I think it is important to try to get clarification as to the cause of his bleeding, to help guide further treatment, with particular reference to antiplatelet therapy.    Overall, this seems most compatible with a diverticular bleed based on the severity of bleeding and the acuity, as opposed to vascular ectasia bleeding which would typically be more chronic, subacute or subclinical in character.  Although the Plavix will not be entirely out of the patient's system by tomorrow, I think it will be sufficiently gone that we could do colonoscopic intervention such as biopsy, cold snare, or  APC, if needed.   LOS: 1 day   Youlanda Mighty Vir Whetstine  07/05/2020, 6:23 PM   Pager [phone] If no answer or after 5 PM call [phone]

## 2020-07-05 NOTE — Anesthesia Preprocedure Evaluation (Addendum)
Anesthesia Evaluation  Patient identified by MRN, date of birth, ID band Patient awake    Reviewed: Allergy & Precautions, NPO status , Patient's Chart, lab work & pertinent test results  Airway Mallampati: II  TM Distance: >3 FB Neck ROM: Full    Dental no notable dental hx. (+) Lower Dentures, Upper Dentures   Pulmonary COPD,  oxygen dependent, former smoker,    Pulmonary exam normal breath sounds clear to auscultation       Cardiovascular hypertension, + CAD and + Past MI  Normal cardiovascular exam Rhythm:Regular Rate:Normal  07/05/20 echo 1. Left ventricular ejection fraction, by estimation, is 60 to 65%. The  left ventricle has normal function. Left ventricular endocardial border  not optimally defined to evaluate regional wall motion. There is mild left  ventricular hypertrophy. Left  ventricular diastolic parameters are consistent with Grade I diastolic  dysfunction (impaired relaxation). Elevated left ventricular end-diastolic  pressure.  2. Right ventricular systolic function is normal. The right ventricular  size is normal. There is normal pulmonary artery systolic pressure. The  estimated right ventricular systolic pressure is 47.6 mmHg.  3. Left atrial size was mildly dilated.    Neuro/Psych negative neurological ROS  negative psych ROS   GI/Hepatic GERD  ,  Endo/Other  diabetes  Renal/GU Dialysis and ESRFRenal disease     Musculoskeletal  (+) Arthritis ,   Abdominal   Peds  Hematology  (+) anemia , hgb 6.3   Anesthesia Other Findings All : Hydrochlorothiazide  Reproductive/Obstetrics                            Anesthesia Physical Anesthesia Plan  ASA: 4  Anesthesia Plan: MAC   Post-op Pain Management:    Induction: Intravenous  PONV Risk Score and Plan: 2 and Treatment may vary due to age or medical condition  Airway Management Planned: Natural Airway and  Nasal Cannula  Additional Equipment: None  Intra-op Plan:   Post-operative Plan:   Informed Consent: I have reviewed the patients History and Physical, chart, labs and discussed the procedure including the risks, benefits and alternatives for the proposed anesthesia with the patient or authorized representative who has indicated his/her understanding and acceptance.     Dental advisory given  Plan Discussed with: CRNA and Anesthesiologist  Anesthesia Plan Comments: (EGD Colon for anemia and hematachezia)       Anesthesia Quick Evaluation

## 2020-07-05 NOTE — Progress Notes (Signed)
PROGRESS NOTE    Nicholas [name]  WEX:[id number] DOB: 1930/02/17 DOA: 07/04/2020 PCP: [facility]   Brief Narrative:  HPI: Nicholas [name] is a 87 y.o. male with medical history significant of  ESRD (HD TTS), HTN, HLD, CHF, ACD with baseline hemoglobin around 7-8, hx of CAD s/p PCI 08/13/2018 with stents x2 on plavix.  Recently admitted on Jun 04, 2020 for chest pain and found to have acute on chronic anemia with a hemoglobin of 5.7. given 1 unit of packed red blood cells with improvement of his hemoglobin to baseline.  He had no evidence of GI bleeding.  He presents to ER today for a couple episodes of bright red blood in stool with subsequent syncope in his chair with loss of consciousness for 1 minute.  He states he woke up at 430 this morning and had a very large dark bowel movement.  He back to bed and then woke up with urgency to defecate and another BM blood in it and had blood when he wiped.  He started to feel nauseated but never had any episodes of vomiting and denies any abdominal pain.  He had another episode with bright red blood in the stool went and sat down in his chair and then lost consciousness his son and his mother wife witnessed this.  He woke up to cold compresses on his forehead.  Wife called 911.  denies any urine incontinence or tongue biting during this episode.  Overall he looks good and feels okay.  He denies any fever, chills, upper respiratory symptoms, abdominal pain, nausea or vomiting at this time.  He denies any leg swelling out of his baseline.  He denies any chest pain or palpitations.  Does endorse a chronic cough.    No family history of colon cancer. No abnormal colonoscopies. Last colonoscopy 02/03/19 with cauterization of cecal angiodysplasia> egd showed gastric avms s?p ost apc on 1/8 Gi capsule study: 04/01/19   ED Course: vitals: HR 103/49, heart rate 101, respiratory rate 18, oxygen 100% on room air.  Fecal Occult was positive and hemoglobin was 6.6.   Asked to admit for acute blood loss anemia likely secondary to GI bleed. with syncope  Assessment & Plan:   Principal Problem:   Acute blood loss anemia Active Problems:   Hypertension   Hypercholesteremia   ESRD (end stage renal disease) on dialysis (HCC)   Anemia of chronic disease   GIB (gastrointestinal bleeding)   Type II diabetes mellitus with renal manifestations (HCC)   CAD (coronary artery disease)   Chronic systolic CHF (congestive heart failure) (HCC)   Acute blood loss anemia on top of anemia of chronic disease due to ESRD/bright red blood per rectum/rectal bleeding -hemoglobin of 6.6, baseline appears to be around 7-8.  Some reason, no blood transfusion was ordered.  Hemoglobin dropped to 6.3.  Patient has no symptoms and he has not had any further rectal bleeding however we need to keep his hemoglobin over 7 so I will transfuse 1 unit PRBC and repeat H&H after that.  Discussed with GI, they plan to do EGD and colonoscopy in the morning and will prep him for that.  Continue Protonix.   Syncope -Secondary to anemia which might have caused hypotension temporarily.  Doing well now.  Monitor.       Hypertension: Controlled, continue current home medications.  Hyperlipidemia: Continue statin.     ESRD (end stage renal disease) on dialysis [facility]) -Nephrology consulted , he had not missed any  dialysis.  He received his dialysis early morning.  Further dialysis schedule per nephrology.   CAD S/p PCI 07/2018 on plavix.  Recently admitted for unstable angina status postcardiac cath on 5/13.  Did not want to undergo CABG and recommended medical management. -hold plavix/ASA with acute bleed. -continue tele  -Continue statin and isosorbide     Type II diabetes mellitus with renal manifestations (HCC) -Recent A1c of 7.9 -Continue home dose of 70/30 of 20 units twice daily and add SSI.     Chronic systolic CHF (congestive heart failure) (HCC) -Strict I's and O's -Daily  weights -Last echo March 18, 2019 with EF of 60 to 65%.  Grade 1 diastolic dysfunction.  Elevated left ventricular end-diastolic pressure -repeat echo  -no clinical evidence of volume overload and receiving dialysis tonight  DVT prophylaxis: SCDs Start: 07/04/20 1721   Code Status: Full Code  Family Communication:  None present at bedside.  Plan of care discussed with patient in length and he verbalized understanding and agreed with it.  Status is: Inpatient  Remains inpatient appropriate because:Ongoing diagnostic testing needed not appropriate for outpatient work up  Dispo: The patient is from: Home              Anticipated d/c is to: Home              Patient currently is not medically stable to d/c.   Difficult to place patient No        Estimated body mass index is 23.63 kg/m as calculated from the following:   Height as of this encounter: 5\' 8"  (1.727 m).   Weight as of this encounter: 70.5 kg.      Nutritional status:               Consultants:  GI and nephrology  Procedures:  None  Antimicrobials:  Anti-infectives (From admission, onward)    None          Subjective: Patient seen and examined.  He has no complaints.  Sitting at the edge of the bed.  Objective: Vitals:   07/05/20 0202 07/05/20 0217 07/05/20 0232 07/05/20 0247  BP: (!) 100/55 (!) 91/58 (!) 106/58 (!) 106/52  Pulse: 88 89 94 93  Resp: (!) 25 (!) 25 (!) 23 15  Temp:    97.7 F (36.5 C)  TempSrc:    Oral  SpO2: 100% 100% 100% 100%  Weight:      Height:        Intake/Output Summary (Last 24 hours) at 07/05/2020 1028 Last data filed at 07/05/2020 0247 Gross per 24 hour  Intake --  Output 648 ml  Net -648 ml   Filed Weights   07/04/20 0958 07/04/20 2230  Weight: 71.7 kg 70.5 kg    Examination:  General exam: Appears calm and comfortable  Respiratory system: Clear to auscultation. Respiratory effort normal. Cardiovascular system: S1 & S2 heard, RRR. No JVD,  murmurs, rubs, gallops or clicks. No pedal edema. Gastrointestinal system: Abdomen is nondistended, soft and nontender. No organomegaly or masses felt. Normal bowel sounds heard. Central nervous system: Alert and oriented. No focal neurological deficits. Extremities: Symmetric 5 x 5 power. Skin: No rashes, lesions or ulcers Psychiatry: Judgement and insight appear normal. Mood & affect appropriate.    Data Reviewed: I have personally reviewed following labs and imaging studies  CBC: Recent Labs  Lab 07/04/20 1000 07/05/20 0455  WBC 11.4*  --   HGB 6.6* 6.3*  HCT 21.7* 19.6*  MCV  95.6  --   PLT 308  --    Basic Metabolic Panel: Recent Labs  Lab 07/04/20 1000 07/04/20 1825  NA 139  --   K 4.8  --   CL 96*  --   CO2 30  --   GLUCOSE 253*  --   BUN 35*  --   CREATININE 7.06*  --   CALCIUM 9.3  --   MG  --  2.2  PHOS  --  4.6   GFR: Estimated Creatinine Clearance: 7.1 mL/min (A) (by C-G formula based on SCr of 7.06 mg/dL (H)). Liver Function Tests: Recent Labs  Lab 07/04/20 1000  AST 11*  ALT 12  ALKPHOS 61  BILITOT 0.5  PROT 6.4*  ALBUMIN 2.7*   Recent Labs  Lab 07/04/20 1205  LIPASE 39   No results for input(s): AMMONIA in the last 168 hours. Coagulation Profile: No results for input(s): INR, PROTIME in the last 168 hours. Cardiac Enzymes: No results for input(s): CKTOTAL, CKMB, CKMBINDEX, TROPONINI in the last 168 hours. BNP (last 3 results) No results for input(s): PROBNP in the last 8760 hours. HbA1C: No results for input(s): HGBA1C in the last 72 hours. CBG: Recent Labs  Lab 07/04/20 2055 07/05/20 0930  GLUCAP 241* 119*   Lipid Profile: No results for input(s): CHOL, HDL, LDLCALC, TRIG, CHOLHDL, LDLDIRECT in the last 72 hours. Thyroid Function Tests: No results for input(s): TSH, T4TOTAL, FREET4, T3FREE, THYROIDAB in the last 72 hours. Anemia Panel: No results for input(s): VITAMINB12, FOLATE, FERRITIN, TIBC, IRON, RETICCTPCT in the last 72  hours. Sepsis Labs: No results for input(s): PROCALCITON, LATICACIDVEN in the last 168 hours.  Recent Results (from the past 240 hour(s))  Resp Panel by RT-PCR (Flu A&B, Covid) Nasopharyngeal Swab     Status: None   Collection Time: 07/04/20 12:50 PM   Specimen: Nasopharyngeal Swab; Nasopharyngeal(NP) swabs in vial transport medium  Result Value Ref Range Status   SARS Coronavirus 2 by RT PCR NEGATIVE NEGATIVE Final    Comment: (NOTE) SARS-CoV-2 target nucleic acids are NOT DETECTED.  The SARS-CoV-2 RNA is generally detectable in upper respiratory specimens during the acute phase of infection. The lowest concentration of SARS-CoV-2 viral copies this assay can detect is 138 copies/mL. A negative result does not preclude SARS-Cov-2 infection and should not be used as the sole basis for treatment or other patient management decisions. A negative result may occur with  improper specimen collection/handling, submission of specimen other than nasopharyngeal swab, presence of viral mutation(s) within the areas targeted by this assay, and inadequate number of viral copies(<138 copies/mL). A negative result must be combined with clinical observations, patient history, and epidemiological information. The expected result is Negative.  Fact Sheet for Patients:  EntrepreneurPulse.com.au  Fact Sheet for Healthcare Providers:  IncredibleEmployment.be  This test is no t yet approved or cleared by the [place] FDA and  has been authorized for detection and/or diagnosis of SARS-CoV-2 by FDA under an Emergency Use Authorization (EUA). This EUA will remain  in effect (meaning this test can be used) for the duration of the COVID-19 declaration under Section 564(b)(1) of the Act, 21 U.S.C.section 360bbb-3(b)(1), unless the authorization is terminated  or revoked sooner.       Influenza A by PCR NEGATIVE NEGATIVE Final   Influenza B by PCR NEGATIVE  NEGATIVE Final    Comment: (NOTE) The Xpert Xpress SARS-CoV-2/FLU/RSV plus assay is intended as an aid in the diagnosis of influenza from Nasopharyngeal swab specimens  and should not be used as a sole basis for treatment. Nasal washings and aspirates are unacceptable for Xpert Xpress SARS-CoV-2/FLU/RSV testing.  Fact Sheet for Patients: EntrepreneurPulse.com.au  Fact Sheet for Healthcare Providers: IncredibleEmployment.be  This test is not yet approved or cleared by the [place] FDA and has been authorized for detection and/or diagnosis of SARS-CoV-2 by FDA under an Emergency Use Authorization (EUA). This EUA will remain in effect (meaning this test can be used) for the duration of the COVID-19 declaration under Section 564(b)(1) of the Act, 21 U.S.C. section 360bbb-3(b)(1), unless the authorization is terminated or revoked.  Performed at [facility], [place] [address], [place], [place] [address]       Radiology Studies: No results found.  Scheduled Meds:  sodium chloride   Intravenous Once   sodium chloride   Intravenous Once   atorvastatin  80 mg Oral QHS   Chlorhexidine Gluconate Cloth  6 each Topical Q0600   insulin aspart  0-5 Units Subcutaneous QHS   insulin aspart  0-6 Units Subcutaneous TID WC   insulin aspart protamine- aspart  20 Units Subcutaneous BID WC   isosorbide dinitrate  20 mg Oral BID   [START ON 07/07/2020] lidocaine-prilocaine  1 application Topical Q T,Th,Sa-HD   methocarbamol  500 mg Oral TID   multivitamin  1 tablet Oral Daily   pantoprazole  40 mg Oral Daily   polyethylene glycol-electrolytes  4,000 mL Oral Once   sevelamer carbonate  1,600 mg Oral TID WC   sodium chloride flush  3 mL Intravenous Q12H   Continuous Infusions:  sodium chloride       LOS: 1 day   Time spent: 35-minute   Darliss Cheney, MD [facility]  07/05/2020, 10:28 AM   How to contact the [facility] Attending or Consulting  provider [place] or covering provider during after hours [place], for this patient?  Check the care team in [facility] and look for a) attending/consulting [facility] provider listed and b) the [facility] team listed. Page or secure chat [phone]. Log into [email or web address] and use [place]'s universal password to access. If you do not have the password, please contact the hospital operator. Locate the [facility] provider you are looking for under [facility] and page to a number that you can be directly reached. If you still have difficulty reaching the provider, please page the [facility] (Director on Call) for the Hospitalists listed on amion for assistance.

## 2020-07-06 ENCOUNTER — Inpatient Hospital Stay (HOSPITAL_COMMUNITY): Payer: Medicare Other | Admitting: Anesthesiology

## 2020-07-06 ENCOUNTER — Encounter (HOSPITAL_COMMUNITY): Payer: Self-pay | Admitting: Family Medicine

## 2020-07-06 ENCOUNTER — Encounter (HOSPITAL_COMMUNITY)
Admission: EM | Disposition: A | Discharge status: Home / Self Care | Payer: Self-pay | Source: Home / Self Care | Attending: Family Medicine

## 2020-07-06 ENCOUNTER — Inpatient Hospital Stay (HOSPITAL_COMMUNITY): Payer: Medicare Other

## 2020-07-06 HISTORY — PX: ESOPHAGOGASTRODUODENOSCOPY (EGD) WITH PROPOFOL: SHX5813

## 2020-07-06 HISTORY — PX: COLONOSCOPY WITH PROPOFOL: SHX5780

## 2020-07-06 LAB — CBC
HCT: 25.2 % — ABNORMAL LOW (ref 39.0–52.0)
HCT: 23.4 % — ABNORMAL LOW (ref 39.0–52.0)
Hemoglobin: 8.1 g/dL — ABNORMAL LOW (ref 13.0–17.0)
Hemoglobin: 7.7 g/dL — ABNORMAL LOW (ref 13.0–17.0)
MCH: 30.6 pg (ref 26.0–34.0)
MCH: 30.6 pg (ref 26.0–34.0)
MCHC: 32.1 g/dL (ref 30.0–36.0)
MCHC: 32.9 g/dL (ref 30.0–36.0)
MCV: 95.1 fL (ref 80.0–100.0)
MCV: 92.9 fL (ref 80.0–100.0)
Platelets: 245 10*3/uL (ref 150–400)
Platelets: 285 10*3/uL (ref 150–400)
RBC: 2.65 MIL/uL — ABNORMAL LOW (ref 4.22–5.81)
RBC: 2.52 MIL/uL — ABNORMAL LOW (ref 4.22–5.81)
RDW: 14.3 % (ref 11.5–15.5)
RDW: 14.3 % (ref 11.5–15.5)
WBC: 8.4 10*3/uL (ref 4.0–10.5)
WBC: 8.3 10*3/uL (ref 4.0–10.5)
nRBC: 0 % (ref 0.0–0.2)
nRBC: 0 % (ref 0.0–0.2)

## 2020-07-06 LAB — GLUCOSE, CAPILLARY
Glucose-Capillary: 111 mg/dL — ABNORMAL HIGH (ref 70–99)
Glucose-Capillary: 45 mg/dL — ABNORMAL LOW (ref 70–99)
Glucose-Capillary: 54 mg/dL — ABNORMAL LOW (ref 70–99)
Glucose-Capillary: 91 mg/dL (ref 70–99)
Glucose-Capillary: 134 mg/dL — ABNORMAL HIGH (ref 70–99)

## 2020-07-06 LAB — PREPARE RBC (CROSSMATCH)

## 2020-07-06 LAB — GLUCOSE, RANDOM: Glucose, Bld: 52 mg/dL — ABNORMAL LOW (ref 70–99)

## 2020-07-06 IMAGING — DX DG CHEST 1V PORT
1 series · 1 of 1 positions shown · non-contrast
Comparison: [DATE]

CLINICAL DATA: Right-sided pleural effusion

EXAM:
PORTABLE CHEST 1 VIEW

[chest]
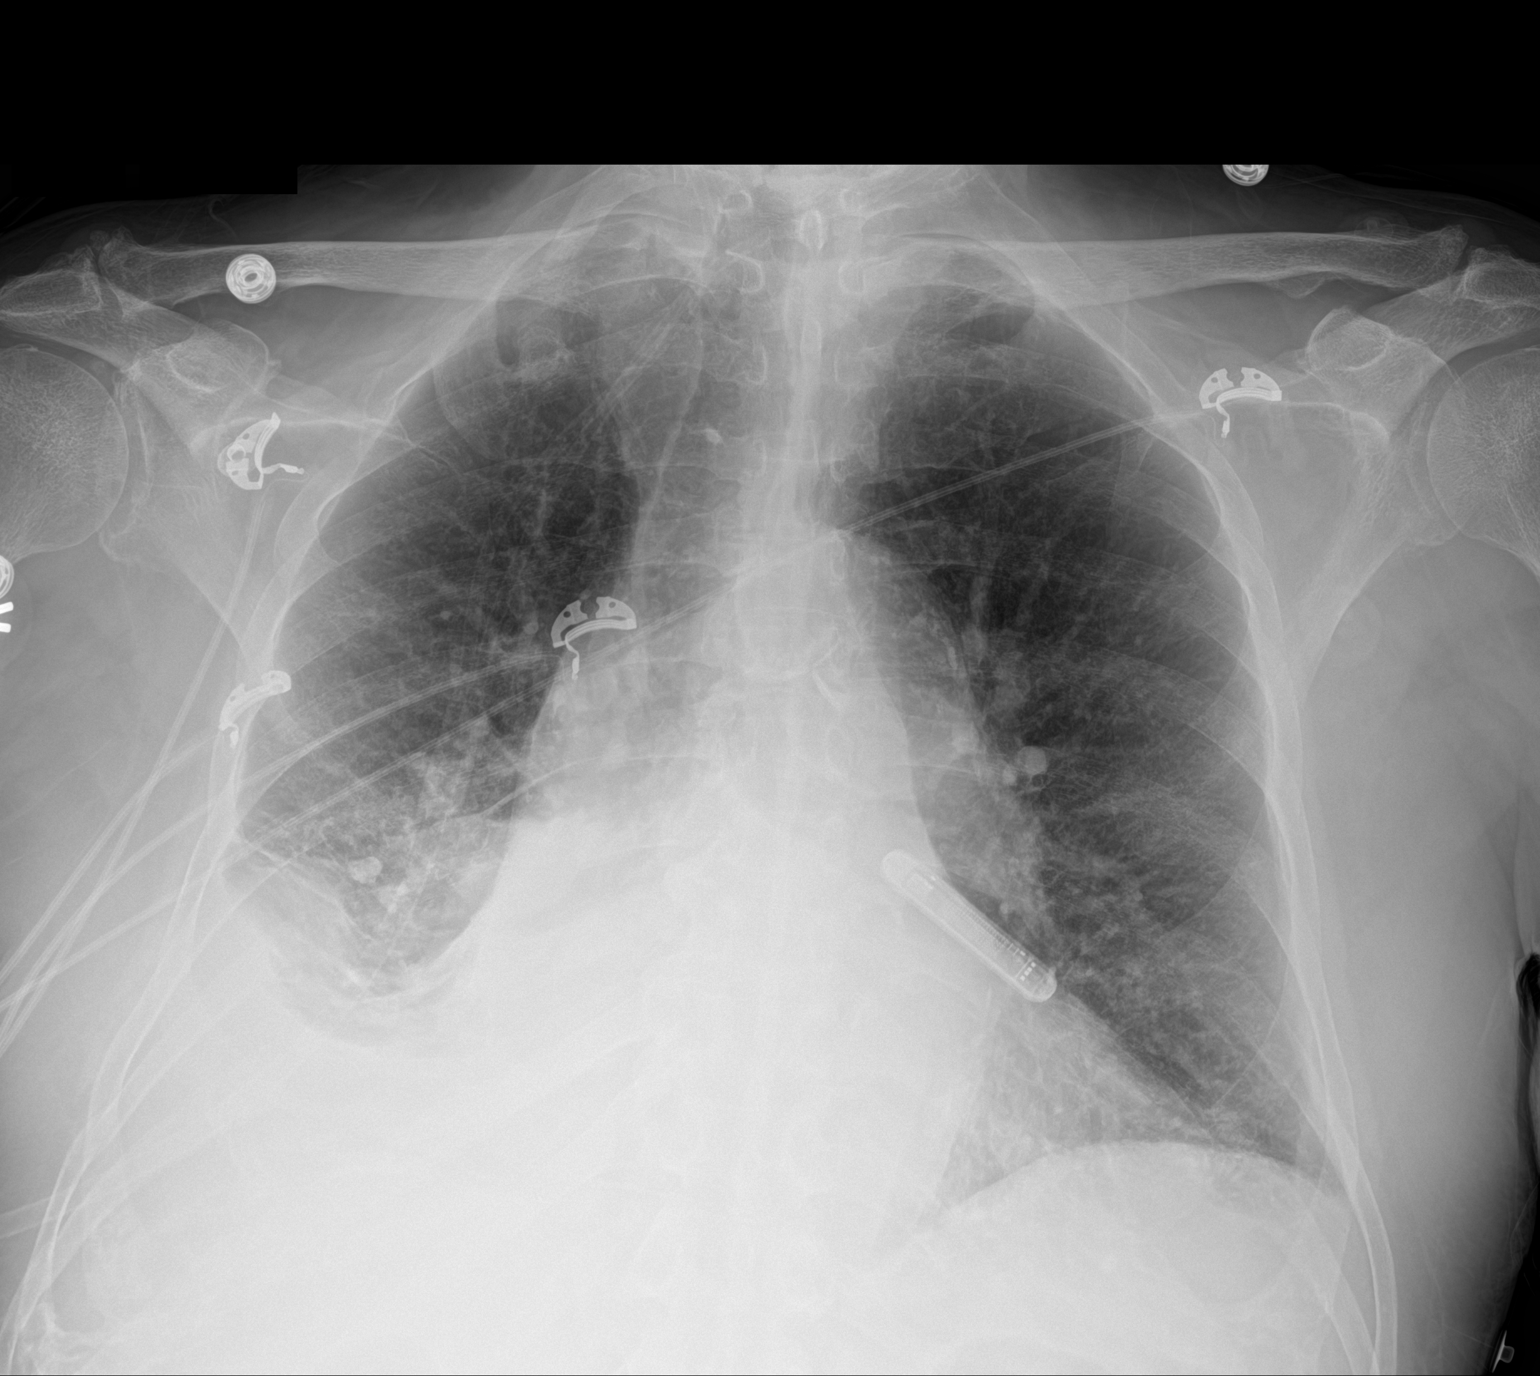

[1 of 1 positions shown; findings below may reference images not displayed]

FINDINGS: Cardiac shadow is enlarged but stable. Loop recorder is again noted.
Aortic calcifications are seen. Left lung is clear. Persistent
right-sided effusion and atelectatic changes are noted stable from
the prior exam. No new focal abnormality is seen.
IMPRESSION: Stable right effusion with basilar atelectasis.

## 2020-07-06 SURGERY — ESOPHAGOGASTRODUODENOSCOPY (EGD) WITH PROPOFOL
Anesthesia: Monitor Anesthesia Care

## 2020-07-06 MED ORDER — GLUCOSE 40 % PO GEL
ORAL | Status: AC
  Filled 2020-07-06: qty 1

## 2020-07-06 MED ORDER — NEPRO/CARBSTEADY PO LIQD
237.0000 mL | Freq: Every day | ORAL | Status: DC
  Administered 2020-07-07: 237 mL via ORAL

## 2020-07-06 MED ORDER — PHENYLEPHRINE HCL-NACL 10-0.9 MG/250ML-% IV SOLN
INTRAVENOUS | Status: DC | PRN
  Administered 2020-07-06: 50 ug/min via INTRAVENOUS

## 2020-07-06 MED ORDER — PROPOFOL 500 MG/50ML IV EMUL
INTRAVENOUS | Status: DC | PRN
  Administered 2020-07-06: 75 ug/kg/min via INTRAVENOUS

## 2020-07-06 MED ORDER — SODIUM CHLORIDE 0.9 % IV SOLN: INTRAVENOUS | Status: DC

## 2020-07-06 MED ORDER — LIDOCAINE 2% (20 MG/ML) 5 ML SYRINGE
INTRAMUSCULAR | Status: DC | PRN
  Administered 2020-07-06: 100 mg via INTRAVENOUS

## 2020-07-06 MED ORDER — PROPOFOL 10 MG/ML IV BOLUS
INTRAVENOUS | Status: DC | PRN
  Administered 2020-07-06: 30 mg via INTRAVENOUS

## 2020-07-06 SURGICAL SUPPLY — 24 items

## 2020-07-06 NOTE — Progress Notes (Signed)
PROGRESS NOTE    Nicholas [name]  QMV:[id number] DOB: 01/27/1930 DOA: 07/04/2020 PCP: [facility], [facility]   Brief Narrative:  Nicholas [name] is a 87 y.o. male with medical history significant of  ESRD (HD TTS), HTN, HLD, CHF, ACD with baseline hemoglobin around 7-8, hx of CAD s/p PCI 08/13/2018 with stents x2 on plavix.  Recently admitted on Jun 04, 2020 for chest pain and found to have acute on chronic anemia with a hemoglobin of 5.7. given 1 unit of packed red blood cells with improvement of his hemoglobin to baseline.  He had no evidence of GI bleeding.  He presented to ER for a couple episodes of bright red blood in stool with subsequent syncope in his chair with loss of consciousness for 1 minute.  Wife called 911.  denies any urine incontinence or tongue biting during this episode. No history of abnormal colonoscopies. Last colonoscopy 02/03/19 with cauterization of cecal angiodysplasia> egd showed gastric avms s?p ost apc on 1/8 Upon arrival to ED, he was hemodynamically stable but his hemoglobin was 6.6.  He was admitted under hospital service, nephrology was consulted.  He underwent hemodialysis.  His hemoglobin dropped to 6.3.  He was transfused 1 unit of baby aspirin.  GI was consulted and he underwent EGD and colonoscopy which was completely unremarkable.    Assessment & Plan:   Principal Problem:   Acute blood loss anemia Active Problems:   Hypertension   Hypercholesteremia   ESRD (end stage renal disease) on dialysis (HCC)   Anemia of chronic disease   GIB (gastrointestinal bleeding)   Type II diabetes mellitus with renal manifestations (HCC)   CAD (coronary artery disease)   Chronic systolic CHF (congestive heart failure) (HCC)   Acute blood loss anemia on top of anemia of chronic disease due to ESRD/bright red blood per rectum/rectal bleeding -hemoglobin of 6.6, baseline appears to be around 7-8.  Some reason, no blood transfusion was ordered.  Hemoglobin dropped to 6.3.   Unit of PRBC was transfused on 07/05/2020.  Underwent EGD and colonoscopy on 07/06/2020 and that was completely unremarkable or negative for any bleeding.  GI opined that his bleeding was likely diverticular which stopped spontaneously.  Protonix discontinued.  Started on liquid diet.  GI recommends watching another night and possibly discharging tomorrow if no further episodes of bleeding and hemoglobin is stable.  Continue to hold Plavix and resume after week per GI recommendation.   Syncope -Secondary to anemia which might have caused hypotension temporarily.  Doing well now.  Monitor.    Right pleural effusion/chronic hypoxic respiratory failure: Patient and his wife mentioned this morning that they were called by patient's PCP.  Patient apparently had a CT chest about a week ago which shows pleural effusion.  Patient's PCP has recommended that patient sees pulmonologist in the hospital before discharge.  Patient denies any shortness of breath and he is on 2 L of oxygen which is his baseline.  He did have slightly decreased breath sounds on the right base.  I had a lengthy discussion with the wife and I recommended that we at least start with a chest x-ray.  Fortunately chest x-ray shows very small pleural effusion on the right which is very stable compared to an x-ray a month ago which was done post thoracentesis while he was here at [facility].  Based off of the fact that it is small, stable, he is now requiring more oxygen than his baseline and he is not having respiratory symptoms,  there is no indication of pulmonary consult or an thoracentesis.  I expect his small pleural effusion to get better over time with dialysis.  I had discussed my findings and plan of care with the wife again over the phone.  She was agreeable with the plan.   Hypertension: Controlled, continue current home medications.  Hyperlipidemia: Continue statin.     ESRD (end stage renal disease) on dialysis [facility]) -Nephrology  consulted , he had not missed any dialysis. Further dialysis schedule per nephrology.   CAD S/p PCI 07/2018 on plavix.  Recently admitted for unstable angina status postcardiac cath on 5/13.  Did not want to undergo CABG and recommended medical management. -hold plavix/ASA with acute bleed. -continue tele  -Continue statin and isosorbide     Type II diabetes mellitus with renal manifestations (HCC) -Recent A1c of 7.9 -Continue home dose of 70/30 of 20 units twice daily and add SSI.     Chronic systolic CHF (congestive heart failure) (HCC) -Strict I's and O's -Daily weights -Last echo March 18, 2019 with EF of 60 to 65%.  Grade 1 diastolic dysfunction.  Elevated left ventricular end-diastolic pressure -repeat echo shows normal ejection fraction with grade 1 diastolic dysfunction.  No wall motion abnormality.  -no clinical evidence of volume overload and receiving dialysis tonight  DVT prophylaxis: SCDs Start: 07/04/20 1721   Code Status: Full Code  Family Communication:  None present at bedside.  Plan of care discussed with patient in length and he verbalized understanding and agreed with it.  Also discussed with his wife.  Status is: Inpatient  Remains inpatient appropriate because:Ongoing diagnostic testing needed not appropriate for outpatient work up  Dispo: The patient is from: Home              Anticipated d/c is to: Home              Patient currently is not medically stable to d/c.   Difficult to place patient No   Estimated body mass index is 23.63 kg/m as calculated from the following:   Height as of this encounter: 5\' 8"  (1.727 m).   Weight as of this encounter: 70.5 kg.     Nutritional status:             Consultants:  GI and nephrology  Procedures:  EGD and colonoscopy  Antimicrobials:  Anti-infectives (From admission, onward)    None         Subjective: Seen and examined.  No complaints.  Wants to have tomorrow's dialysis at his regular  clinic at 11 AM.  And wants to be discharged before that.  Objective: Vitals:   07/06/20 0829 07/06/20 0840 07/06/20 0850 07/06/20 0913  BP: (!) 126/51 (!) 138/57 (!) 124/57 124/64  Pulse: 86 85 84 87  Resp: (!) 21 17 20 17   Temp:    97.8 F (36.6 C)  TempSrc:    Axillary  SpO2: 100% 100% 100% 100%  Weight:      Height:        Intake/Output Summary (Last 24 hours) at 07/06/2020 1058 Last data filed at 07/06/2020 0815 Gross per 24 hour  Intake 1560 ml  Output --  Net 1560 ml    Filed Weights   07/04/20 0958 07/04/20 2230 07/06/20 0716  Weight: 71.7 kg 70.5 kg 70.5 kg    Examination:  General exam: Appears calm and comfortable  Respiratory system: Clear to auscultation. Respiratory effort normal. Cardiovascular system: S1 & S2 heard, RRR. No JVD,  murmurs, rubs, gallops or clicks. No pedal edema. Gastrointestinal system: Abdomen is nondistended, soft and nontender. No organomegaly or masses felt. Normal bowel sounds heard. Central nervous system: Alert and oriented. No focal neurological deficits. Extremities: Symmetric 5 x 5 power. Skin: No rashes, lesions or ulcers.  Psychiatry: Judgement and insight appear normal. Mood & affect appropriate.   Data Reviewed: I have personally reviewed following labs and imaging studies  CBC: Recent Labs  Lab 07/04/20 1000 07/05/20 0455 07/06/20 0236  WBC 11.4*  --  8.4  HGB 6.6* 6.3* 8.1*  HCT 21.7* 19.6* 25.2*  MCV 95.6  --  95.1  PLT 308  --  850    Basic Metabolic Panel: Recent Labs  Lab 07/04/20 1000 07/04/20 1825  NA 139  --   K 4.8  --   CL 96*  --   CO2 30  --   GLUCOSE 253*  --   BUN 35*  --   CREATININE 7.06*  --   CALCIUM 9.3  --   MG  --  2.2  PHOS  --  4.6    GFR: Estimated Creatinine Clearance: 7.1 mL/min (A) (by C-G formula based on SCr of 7.06 mg/dL (H)). Liver Function Tests: Recent Labs  Lab 07/04/20 1000  AST 11*  ALT 12  ALKPHOS 61  BILITOT 0.5  PROT 6.4*  ALBUMIN 2.7*    Recent  Labs  Lab 07/04/20 1205  LIPASE 39    No results for input(s): AMMONIA in the last 168 hours. Coagulation Profile: No results for input(s): INR, PROTIME in the last 168 hours. Cardiac Enzymes: No results for input(s): CKTOTAL, CKMB, CKMBINDEX, TROPONINI in the last 168 hours. BNP (last 3 results) No results for input(s): PROBNP in the last 8760 hours. HbA1C: No results for input(s): HGBA1C in the last 72 hours. CBG: Recent Labs  Lab 07/05/20 0930 07/05/20 1305 07/05/20 1635 07/05/20 2240 07/06/20 0639  GLUCAP 119* 186* 118* 149* 111*    Lipid Profile: No results for input(s): CHOL, HDL, LDLCALC, TRIG, CHOLHDL, LDLDIRECT in the last 72 hours. Thyroid Function Tests: No results for input(s): TSH, T4TOTAL, FREET4, T3FREE, THYROIDAB in the last 72 hours. Anemia Panel: No results for input(s): VITAMINB12, FOLATE, FERRITIN, TIBC, IRON, RETICCTPCT in the last 72 hours. Sepsis Labs: No results for input(s): PROCALCITON, LATICACIDVEN in the last 168 hours.  Recent Results (from the past 240 hour(s))  Resp Panel by RT-PCR (Flu A&B, Covid) Nasopharyngeal Swab     Status: None   Collection Time: 07/04/20 12:50 PM   Specimen: Nasopharyngeal Swab; Nasopharyngeal(NP) swabs in vial transport medium  Result Value Ref Range Status   SARS Coronavirus 2 by RT PCR NEGATIVE NEGATIVE Final    Comment: (NOTE) SARS-CoV-2 target nucleic acids are NOT DETECTED.  The SARS-CoV-2 RNA is generally detectable in upper respiratory specimens during the acute phase of infection. The lowest concentration of SARS-CoV-2 viral copies this assay can detect is 138 copies/mL. A negative result does not preclude SARS-Cov-2 infection and should not be used as the sole basis for treatment or other patient management decisions. A negative result may occur with  improper specimen collection/handling, submission of specimen other than nasopharyngeal swab, presence of viral mutation(s) within the areas targeted  by this assay, and inadequate number of viral copies(<138 copies/mL). A negative result must be combined with clinical observations, patient history, and epidemiological information. The expected result is Negative.  Fact Sheet for Patients:  EntrepreneurPulse.com.au  Fact Sheet for Healthcare Providers:  IncredibleEmployment.be  This test is no t yet approved or cleared by the [place] and  has been authorized for detection and/or diagnosis of SARS-CoV-2 by FDA under an Emergency Use Authorization (EUA). This EUA will remain  in effect (meaning this test can be used) for the duration of the COVID-19 declaration under Section 564(b)(1) of the Act, 21 U.S.C.section 360bbb-3(b)(1), unless the authorization is terminated  or revoked sooner.       Influenza A by PCR NEGATIVE NEGATIVE Final   Influenza B by PCR NEGATIVE NEGATIVE Final    Comment: (NOTE) The Xpert Xpress SARS-CoV-2/FLU/RSV plus assay is intended as an aid in the diagnosis of influenza from Nasopharyngeal swab specimens and should not be used as a sole basis for treatment. Nasal washings and aspirates are unacceptable for Xpert Xpress SARS-CoV-2/FLU/RSV testing.  Fact Sheet for Patients: EntrepreneurPulse.com.au  Fact Sheet for Healthcare Providers: IncredibleEmployment.be  This test is not yet approved or cleared by the [place] FDA and has been authorized for detection and/or diagnosis of SARS-CoV-2 by FDA under an Emergency Use Authorization (EUA). This EUA will remain in effect (meaning this test can be used) for the duration of the COVID-19 declaration under Section 564(b)(1) of the Act, 21 U.S.C. section 360bbb-3(b)(1), unless the authorization is terminated or revoked.  Performed at [facility], [place] [address] [place], [place] [address]        Radiology Studies: DG CHEST PORT 1 VIEW  Result Date:  07/06/2020 CLINICAL DATA:  Right-sided pleural effusion EXAM: PORTABLE CHEST 1 VIEW COMPARISON:  06/08/2020 FINDINGS: Cardiac shadow is enlarged but stable. Loop recorder is again noted. Aortic calcifications are seen. Left lung is clear. Persistent right-sided effusion and atelectatic changes are noted stable from the prior exam. No new focal abnormality is seen. IMPRESSION: Stable right effusion with basilar atelectasis. Electronically Signed   By: Inez Catalina M.D.   On: 07/06/2020 10:51   ECHOCARDIOGRAM COMPLETE  Result Date: 07/05/2020    ECHOCARDIOGRAM REPORT   Patient Name:   KAMARE CASPERS Date of Exam: 07/05/2020 Medical Rec #:  [id number]    Height:       68.0 in Accession #:    [id number]   Weight:       155.4 lb Date of Birth:  11/12/1930    BSA:          1.836 m Patient Age:    70 years     BP:           123/60 mmHg Patient Gender: M            HR:           79 bpm. Exam Location:  Inpatient Procedure: 2D Echo, Cardiac Doppler and Color Doppler Indications:    Shortness of breath  History:        Patient has prior history of Echocardiogram examinations, most                 recent 03/18/2019. CHF, CAD, COPD; Risk Factors:Former Smoker.                 ESRD, GI bleed, anemia.  Sonographer:    Dustin Flock Referring Phys: [id number] [place]  1. Left ventricular ejection fraction, by estimation, is 60 to 65%. The left ventricle has normal function. Left ventricular endocardial border not optimally defined to evaluate regional wall motion. There is mild left ventricular hypertrophy. Left ventricular diastolic parameters are consistent with Grade I diastolic dysfunction (impaired relaxation).  Elevated left ventricular end-diastolic pressure.  2. Right ventricular systolic function is normal. The right ventricular size is normal. There is normal pulmonary artery systolic pressure. The estimated right ventricular systolic pressure is 53.6 mmHg.  3. Left atrial size was mildly dilated.  4.  The mitral valve is normal in structure. No evidence of mitral valve regurgitation. No evidence of mitral stenosis.  5. The aortic valve has an indeterminant number of cusps. Aortic valve regurgitation is not visualized. Mild aortic valve sclerosis is present, with no evidence of aortic valve stenosis.  6. The inferior vena cava is normal in size with greater than 50% respiratory variability, suggesting right atrial pressure of 3 mmHg. FINDINGS  Left Ventricle: Left ventricular ejection fraction, by estimation, is 60 to 65%. The left ventricle has normal function. Left ventricular endocardial border not optimally defined to evaluate regional wall motion. The left ventricular internal cavity size was normal in size. There is mild left ventricular hypertrophy. Left ventricular diastolic parameters are consistent with Grade I diastolic dysfunction (impaired relaxation). Elevated left ventricular end-diastolic pressure. Right Ventricle: The right ventricular size is normal. No increase in right ventricular wall thickness. Right ventricular systolic function is normal. There is normal pulmonary artery systolic pressure. The tricuspid regurgitant velocity is 2.39 m/s, and  with an assumed right atrial pressure of 3 mmHg, the estimated right ventricular systolic pressure is 64.4 mmHg. Left Atrium: Left atrial size was mildly dilated. Right Atrium: Right atrial size was normal in size. Pericardium: There is no evidence of pericardial effusion. Mitral Valve: The mitral valve is normal in structure. There is mild calcification of the mitral valve leaflet(s). Mild mitral annular calcification. No evidence of mitral valve regurgitation. No evidence of mitral valve stenosis. Tricuspid Valve: The tricuspid valve is normal in structure. Tricuspid valve regurgitation is trivial. No evidence of tricuspid stenosis. Aortic Valve: The aortic valve has an indeterminant number of cusps. Aortic valve regurgitation is not visualized. Mild  aortic valve sclerosis is present, with no evidence of aortic valve stenosis. Pulmonic Valve: The pulmonic valve was normal in structure. Pulmonic valve regurgitation is not visualized. No evidence of pulmonic stenosis. Aorta: The aortic root is normal in size and structure. Venous: The inferior vena cava is normal in size with greater than 50% respiratory variability, suggesting right atrial pressure of 3 mmHg. IAS/Shunts: No atrial level shunt detected by color flow Doppler.  LEFT VENTRICLE PLAX 2D LVIDd:         3.90 cm  Diastology LVIDs:         3.40 cm  LV e' medial:    3.37 cm/s LV PW:         1.20 cm  LV E/e' medial:  35.9 LV IVS:        1.20 cm  LV e' lateral:   5.00 cm/s LVOT diam:     2.20 cm  LV E/e' lateral: 24.2 LV SV:         91 LV SV Index:   49 LVOT Area:     3.80 cm  RIGHT VENTRICLE RV Basal diam:  2.40 cm RV S prime:     12.50 cm/s TAPSE (M-mode): 2.5 cm LEFT ATRIUM             Index       RIGHT ATRIUM           Index LA diam:        5.10 cm 2.78 cm/m  RA Area:     16.60 cm  LA Vol (A2C):   55.2 ml 30.06 ml/m RA Volume:   42.40 ml  23.09 ml/m LA Vol (A4C):   70.6 ml 38.45 ml/m LA Biplane Vol: 64.8 ml 35.29 ml/m  AORTIC VALVE LVOT Vmax:   127.00 cm/s LVOT Vmean:  76.500 cm/s LVOT VTI:    0.239 m MITRAL VALVE                TRICUSPID VALVE MV Area (PHT): 3.66 cm     TR Peak grad:   22.8 mmHg MV Decel Time: 207 msec     TR Vmax:        239.00 cm/s MV E velocity: 121.00 cm/s MV A velocity: 165.00 cm/s  SHUNTS MV E/A ratio:  0.73         Systemic VTI:  0.24 m                             Systemic Diam: 2.20 cm Fransico Him MD Electronically signed by Fransico Him MD Signature Date/Time: 07/05/2020/1:18:10 PM    Final     Scheduled Meds:  sodium chloride   Intravenous Once   atorvastatin  80 mg Oral QHS   Chlorhexidine Gluconate Cloth  6 each Topical Q0600   feeding supplement (NEPRO CARB STEADY)  237 mL Oral Daily   insulin aspart  0-5 Units Subcutaneous QHS   insulin aspart  0-6 Units  Subcutaneous TID WC   insulin aspart protamine- aspart  20 Units Subcutaneous BID WC   isosorbide dinitrate  20 mg Oral BID   [START ON 07/07/2020] lidocaine-prilocaine  1 application Topical Q T,Th,Sa-HD   methocarbamol  500 mg Oral TID   multivitamin  1 tablet Oral Daily   sevelamer carbonate  1,600 mg Oral TID WC   sodium chloride flush  3 mL Intravenous Q12H   Continuous Infusions:  sodium chloride       LOS: 2 days   Time spent: 36-minute   Darliss Cheney, MD [facility]  07/06/2020, 10:58 AM   How to contact the [facility] Attending or Consulting provider [place] or covering provider during after hours [place], for this patient?  Check the care team in [facility] and look for a) attending/consulting [facility] provider listed and b) the [facility] team listed. Page or secure chat [phone]. Log into [email or web address] and use [place]'s universal password to access. If you do not have the password, please contact the hospital operator. Locate the [facility] provider you are looking for under [facility] and page to a number that you can be directly reached. If you still have difficulty reaching the provider, please page the [facility] (Director on Call) for the Hospitalists listed on amion for assistance.

## 2020-07-06 NOTE — Progress Notes (Signed)
[facility] Progress Note   Subjective: Seen in room. Says he feels well, has NEVER felt bad. HD tomorrow on schedule. No heparin.     Objective Vitals:   07/06/20 0829 07/06/20 0840 07/06/20 0850 07/06/20 0913  BP: (!) 126/51 (!) 138/57 (!) 124/57 124/64  Pulse: 86 85 84 87  Resp: (!) 21 17 20 17   Temp:    97.8 F (36.6 C)  TempSrc:    Axillary  SpO2: 100% 100% 100% 100%  Weight:      Height:       Physical Exam General: Pleasant elderly male in NAD Heart: S1,S2 RRR No M/R/G Lungs: CTAB Abdomen: S, NT Extremities: No LE edema Dialysis Access: L AVF +T/B                         Additional Objective Labs: Basic Metabolic Panel: Recent Labs  Lab 07/04/20 1000 07/04/20 1825  NA 139  --   K 4.8  --   CL 96*  --   CO2 30  --   GLUCOSE 253*  --   BUN 35*  --   CREATININE 7.06*  --   CALCIUM 9.3  --   PHOS  --  4.6   Liver Function Tests: Recent Labs  Lab 07/04/20 1000  AST 11*  ALT 12  ALKPHOS 61  BILITOT 0.5  PROT 6.4*  ALBUMIN 2.7*   Recent Labs  Lab 07/04/20 1205  LIPASE 39   CBC: Recent Labs  Lab 07/04/20 1000 07/05/20 0455 07/06/20 0236  WBC 11.4*  --  8.4  HGB 6.6* 6.3* 8.1*  HCT 21.7* 19.6* 25.2*  MCV 95.6  --  95.1  PLT 308  --  245   Blood Culture    Component Value Date/Time   SDES PLEURAL FLUID 06/08/2020 0839   SPECREQUEST LUNG RIGHT 06/08/2020 0839   CULT  06/08/2020 0839    NO GROWTH 3 DAYS Performed at [facility], [place] [address], [place], [place] [address]    REPTSTATUS 06/11/2020 FINAL 06/08/2020 0839    Cardiac Enzymes: No results for input(s): CKTOTAL, CKMB, CKMBINDEX, TROPONINI in the last 168 hours. CBG: Recent Labs  Lab 07/05/20 0930 07/05/20 1305 07/05/20 1635 07/05/20 2240 07/06/20 0639  GLUCAP 119* 186* 118* 149* 111*   Iron Studies: No results for input(s): IRON, TIBC, TRANSFERRIN, FERRITIN in the last 72 hours. @lablastinr3 @ Studies/Results: DG CHEST PORT 1 VIEW  Result  Date: 07/06/2020 CLINICAL DATA:  Right-sided pleural effusion EXAM: PORTABLE CHEST 1 VIEW COMPARISON:  06/08/2020 FINDINGS: Cardiac shadow is enlarged but stable. Loop recorder is again noted. Aortic calcifications are seen. Left lung is clear. Persistent right-sided effusion and atelectatic changes are noted stable from the prior exam. No new focal abnormality is seen. IMPRESSION: Stable right effusion with basilar atelectasis. Electronically Signed   By: Inez Catalina M.D.   On: 07/06/2020 10:51   ECHOCARDIOGRAM COMPLETE  Result Date: 07/05/2020    ECHOCARDIOGRAM REPORT   Patient Name:   Nicholas [name] Date of Exam: 07/05/2020 Medical Rec #:  [id number]    Height:       68.0 in Accession #:    [id number]   Weight:       155.4 lb Date of Birth:  11/11/1930    BSA:          1.836 m Patient Age:    87 years     BP:  123/60 mmHg Patient Gender: M            HR:           79 bpm. Exam Location:  Inpatient Procedure: 2D Echo, Cardiac Doppler and Color Doppler Indications:    Shortness of breath  History:        Patient has prior history of Echocardiogram examinations, most                 recent 03/18/2019. CHF, CAD, COPD; Risk Factors:Former Smoker.                 ESRD, GI bleed, anemia.  Sonographer:    Dustin Flock Referring Phys: [id number] [place]  1. Left ventricular ejection fraction, by estimation, is 60 to 65%. The left ventricle has normal function. Left ventricular endocardial border not optimally defined to evaluate regional wall motion. There is mild left ventricular hypertrophy. Left ventricular diastolic parameters are consistent with Grade I diastolic dysfunction (impaired relaxation). Elevated left ventricular end-diastolic pressure.  2. Right ventricular systolic function is normal. The right ventricular size is normal. There is normal pulmonary artery systolic pressure. The estimated right ventricular systolic pressure is 47.4 mmHg.  3. Left atrial size was mildly dilated.   4. The mitral valve is normal in structure. No evidence of mitral valve regurgitation. No evidence of mitral stenosis.  5. The aortic valve has an indeterminant number of cusps. Aortic valve regurgitation is not visualized. Mild aortic valve sclerosis is present, with no evidence of aortic valve stenosis.  6. The inferior vena cava is normal in size with greater than 50% respiratory variability, suggesting right atrial pressure of 3 mmHg. FINDINGS  Left Ventricle: Left ventricular ejection fraction, by estimation, is 60 to 65%. The left ventricle has normal function. Left ventricular endocardial border not optimally defined to evaluate regional wall motion. The left ventricular internal cavity size was normal in size. There is mild left ventricular hypertrophy. Left ventricular diastolic parameters are consistent with Grade I diastolic dysfunction (impaired relaxation). Elevated left ventricular end-diastolic pressure. Right Ventricle: The right ventricular size is normal. No increase in right ventricular wall thickness. Right ventricular systolic function is normal. There is normal pulmonary artery systolic pressure. The tricuspid regurgitant velocity is 2.39 m/s, and  with an assumed right atrial pressure of 3 mmHg, the estimated right ventricular systolic pressure is 25.9 mmHg. Left Atrium: Left atrial size was mildly dilated. Right Atrium: Right atrial size was normal in size. Pericardium: There is no evidence of pericardial effusion. Mitral Valve: The mitral valve is normal in structure. There is mild calcification of the mitral valve leaflet(s). Mild mitral annular calcification. No evidence of mitral valve regurgitation. No evidence of mitral valve stenosis. Tricuspid Valve: The tricuspid valve is normal in structure. Tricuspid valve regurgitation is trivial. No evidence of tricuspid stenosis. Aortic Valve: The aortic valve has an indeterminant number of cusps. Aortic valve regurgitation is not visualized.  Mild aortic valve sclerosis is present, with no evidence of aortic valve stenosis. Pulmonic Valve: The pulmonic valve was normal in structure. Pulmonic valve regurgitation is not visualized. No evidence of pulmonic stenosis. Aorta: The aortic root is normal in size and structure. Venous: The inferior vena cava is normal in size with greater than 50% respiratory variability, suggesting right atrial pressure of 3 mmHg. IAS/Shunts: No atrial level shunt detected by color flow Doppler.  LEFT VENTRICLE PLAX 2D LVIDd:         3.90 cm  Diastology LVIDs:  3.40 cm  LV e' medial:    3.37 cm/s LV PW:         1.20 cm  LV E/e' medial:  35.9 LV IVS:        1.20 cm  LV e' lateral:   5.00 cm/s LVOT diam:     2.20 cm  LV E/e' lateral: 24.2 LV SV:         91 LV SV Index:   49 LVOT Area:     3.80 cm  RIGHT VENTRICLE RV Basal diam:  2.40 cm RV S prime:     12.50 cm/s TAPSE (M-mode): 2.5 cm LEFT ATRIUM             Index       RIGHT ATRIUM           Index LA diam:        5.10 cm 2.78 cm/m  RA Area:     16.60 cm LA Vol (A2C):   55.2 ml 30.06 ml/m RA Volume:   42.40 ml  23.09 ml/m LA Vol (A4C):   70.6 ml 38.45 ml/m LA Biplane Vol: 64.8 ml 35.29 ml/m  AORTIC VALVE LVOT Vmax:   127.00 cm/s LVOT Vmean:  76.500 cm/s LVOT VTI:    0.239 m MITRAL VALVE                TRICUSPID VALVE MV Area (PHT): 3.66 cm     TR Peak grad:   22.8 mmHg MV Decel Time: 207 msec     TR Vmax:        239.00 cm/s MV E velocity: 121.00 cm/s MV A velocity: 165.00 cm/s  SHUNTS MV E/A ratio:  0.73         Systemic VTI:  0.24 m                             Systemic Diam: 2.20 cm Fransico Him MD Electronically signed by Fransico Him MD Signature Date/Time: 07/05/2020/1:18:10 PM    Final    Medications:  sodium chloride      sodium chloride   Intravenous Once   atorvastatin  80 mg Oral QHS   Chlorhexidine Gluconate Cloth  6 each Topical Q0600   feeding supplement (NEPRO CARB STEADY)  237 mL Oral Daily   insulin aspart  0-5 Units Subcutaneous QHS   insulin  aspart  0-6 Units Subcutaneous TID WC   insulin aspart protamine- aspart  20 Units Subcutaneous BID WC   isosorbide dinitrate  20 mg Oral BID   [START ON 07/07/2020] lidocaine-prilocaine  1 application Topical Q T,Th,Sa-HD   methocarbamol  500 mg Oral TID   multivitamin  1 tablet Oral Daily   sevelamer carbonate  1,600 mg Oral TID WC   sodium chloride flush  3 mL Intravenous Q12H     Home meds:  - plavix/ lipitor/ sl ntg prn/ isordil 20 tid  - 70/30 insulin 20 bid  - renvela 1600 tid/ dialyvite qd/ nepro qd  - prn baclofen 5 hs/ robaxin prn 500 tid  - prn's/ vitamins/ supplements        OP HD: TTS [facility], dry wt 73kg in May       Assessment/ Plan: GI bleed - presented w/ rectal bleeding and syncopal episode. Admit Hb 6.6. Repeat this am down to 6.3.  No further rectal bleeding here per pt. Went for endo today, no source of bleeding found. Per GI, possible  diverticular bleed which stopped spontaneously. HGB stable at 8.1 today.  ESRD - on HD TTS. Has not missed HD. Had HD early this am. Next HD 6/14.  BP/ volume - BP's low-normal here, not on any BP lowering meds at home. Min UF on HD overnight.  No vol^ on exam. CAD - extensive hx of CAD, had PCI 07/2018. Had [place] here during admit May 2022, no new intervention, medical Rx. MBD ckd - cont binder (not sure which one per pt) Anemia ckd - get records Monday from HD unit. S/P 1 unit PRBCs 07/05/2020.     Nicholas Heal H. Travoris Bushey NP-C 07/06/2020, 1:11 PM  [employer] [phone]

## 2020-07-06 NOTE — Interval H&P Note (Signed)
History and Physical Interval Note:  07/06/2020 7:30 AM  Nicholas [name]  has presented today for surgery, with the diagnosis of heme positive stool and anemia.  The various methods of treatment have been discussed with the patient and family. After consideration of risks, benefits and other options for treatment, the patient has consented to  Procedure(s): ESOPHAGOGASTRODUODENOSCOPY (EGD) WITH PROPOFOL (N/A) COLONOSCOPY WITH PROPOFOL (N/A) as a surgical intervention.  The patient's history has been reviewed, patient examined, no change in status, stable for surgery.  I have reviewed the patient's chart and labs.  Questions were answered to the patient's satisfaction.     Youlanda Mighty Valeria Krisko

## 2020-07-06 NOTE — Op Note (Signed)
[facility] Patient Name: Nicholas [name] Procedure Date : 07/06/2020 MRN: [id number] Attending MD: Ronald Lobo , MD Date of Birth: May 25, 1930 CSN: [id number] Age: 87 Admit Type: Outpatient Procedure:                Upper GI endoscopy Indications:              Acute post hemorrhagic anemia, , Hematochezia;                            reportedly past history of gastric AVM's APC'd                            (?[facility] Jan 2021 Providers:                Ronald Lobo, MD, Lesia Sago, Technician Referring MD:              Medicines:                Monitored Anesthesia Care Complications:            No immediate complications. Estimated Blood Loss:     Estimated blood loss: none. Procedure:                Pre-Anesthesia Assessment:                           - Prior to the procedure, a History and Physical                            was performed, and patient medications and                            allergies were reviewed. The patient's tolerance of                            previous anesthesia was also reviewed. The risks                            and benefits of the procedure and the sedation                            options and risks were discussed with the patient.                            All questions were answered, and informed consent                            was obtained. Prior Anticoagulants: The patient has                            taken Plavix (clopidogrel), last dose was 3 days                            prior to procedure. ASA Grade Assessment: IV - A  patient with severe systemic disease that is a                            constant threat to life. After reviewing the risks                            and benefits, the patient was deemed in                            satisfactory condition to undergo the procedure.                           After obtaining informed consent, the endoscope was                             passed under direct vision. Throughout the                            procedure, the patient's blood pressure, pulse, and                            oxygen saturations were monitored continuously. The                            [id number]-[id number] ([id number]) Olympus gastroscope was                            introduced through the mouth, and advanced to the                            second part of duodenum. The upper GI endoscopy was                            accomplished without difficulty. The patient                            tolerated the procedure well. Scope In: Scope Out: Findings:      The larynx was normal.      The examined esophagus was normal.      The entire examined stomach was normal.      The cardia and gastric fundus were normal on retroflexion.      The examined duodenum was normal. Impression:               - NORMAL EXAM. No prospective source of bleeding                            identified. No AVM's seen. No blood in stomach. No                            gastritis, ulcers, varices.                           - Normal larynx.                           -  Normal esophagus.                           - Normal stomach.                           - Normal examined duodenum.                           - No specimens collected. Recommendation:           - Perform a colonoscopy today. Procedure Code(s):        --- Professional ---                           [phone], Esophagogastroduodenoscopy, flexible,                            transoral; diagnostic, including collection of                            specimen(s) by brushing or washing, when performed                            (separate procedure) Diagnosis Code(s):        --- Professional ---                           D62, Acute posthemorrhagic anemia                           K92.1, Melena (includes Hematochezia) CPT copyright 2019 American Medical Association. All rights reserved. The codes documented in this  report are preliminary and upon coder review may  be revised to meet current compliance requirements. Ronald Lobo, MD 07/06/2020 8:19:28 AM This report has been signed electronically. Number of Addenda: 0

## 2020-07-06 NOTE — Transfer of Care (Signed)
Immediate Anesthesia Transfer of Care Note  Patient: Shakir Petrosino  Procedure(s) Performed: ESOPHAGOGASTRODUODENOSCOPY (EGD) WITH PROPOFOL COLONOSCOPY WITH PROPOFOL  Patient Location: PACU  Anesthesia Type:MAC  Level of Consciousness: awake and alert   Airway & Oxygen Therapy: Patient Spontanous Breathing and Patient connected to nasal cannula oxygen  Post-op Assessment: Report given to RN and Post -op Vital signs reviewed and stable  Post vital signs: Reviewed and stable  Last Vitals:  Vitals Value Taken Time  BP 134/61 07/06/20 0820  Temp    Pulse 72 07/06/20 0819  Resp 25 07/06/20 0821  SpO2 100 % 07/06/20 0819  Vitals shown include unvalidated device data.  Last Pain:  Vitals:   07/06/20 0716  TempSrc: Oral  PainSc: 0-No pain      Patients Stated Pain Goal: 0 (45/85/92 9244)  Complications: No notable events documented.

## 2020-07-06 NOTE — Op Note (Signed)
[facility] Patient Name: Nicholas [name] Procedure Date : 07/06/2020 MRN: [id number] Attending MD: Ronald Lobo , MD Date of Birth: Sep 11, 1930 CSN: [id number] Age: 87 Admit Type: Outpatient Procedure:                Colonoscopy Indications:              Hematochezia, , Acute post hemorrhagic anemia;                            history of diverticulosis and reported history of                            bleeding cecal AVM treated with APC Jan. 2021                            (?[facility] Providers:                Ronald Lobo, MD, Lesia Sago, Technician,                            Kary Kos RN, RN Referring MD:              Medicines:                Monitored Anesthesia Care Complications:            No immediate complications. Estimated Blood Loss:     Estimated blood loss: none. Procedure:                Pre-Anesthesia Assessment:                           - Prior to the procedure, a History and Physical                            was performed, and patient medications and                            allergies were reviewed. The patient's tolerance of                            previous anesthesia was also reviewed. The risks                            and benefits of the procedure and the sedation                            options and risks were discussed with the patient.                            All questions were answered, and informed consent                            was obtained. Prior Anticoagulants: The patient has  taken Plavix (clopidogrel), last dose was 3 days                            prior to procedure. ASA Grade Assessment: IV - A                            patient with severe systemic disease that is a                            constant threat to life. After reviewing the risks                            and benefits, the patient was deemed in                            satisfactory condition to  undergo the procedure.                           After obtaining informed consent, the colonoscope                            was passed under direct vision. Throughout the                            procedure, the patient's blood pressure, pulse, and                            oxygen saturations were monitored continuously. The                            [id number]-[id number] ([id number]) Olympus colonoscope was                            introduced through the anus and advanced to the the                            cecum, identified by appendiceal orifice and                            ileocecal valve. The colonoscopy was somewhat                            difficult due to significant looping. Successful                            completion of the procedure was aided by using                            manual pressure and straightening and shortening                            the scope to obtain bowel loop reduction. The  patient tolerated the procedure well. The quality                            of the bowel preparation was excellent. Scope In: 7:51:56 AM Scope Out: 8:10:39 AM Scope Withdrawal Time: 0 hours 8 minutes 31 seconds  Total Procedure Duration: 0 hours 18 minutes 43 seconds  Findings:      The perianal and digital rectal examinations were normal. Prostate not       well felt.      Scattered medium-mouthed diverticula were found in the entire colon.      A 3 mm polyp was found in the ascending colon. The polyp was sessile. It       was not felt to be clinically significant for this patient and was not       removed.      There is no endoscopic evidence of bleeding, inflammation, mass or       angiodysplasia in the entire colon with particular reference to the       cecum.      The retroflexed view of the distal rectum and anal verge was normal and       showed no anal or rectal abnormalities. Reinspection of the rectum       showed no additional  findings. Impression:               - NO BLEEDING OR BLOOD IN THE COLON at the time of                            this procedure.                           - Diverticulosis in the entire examined colon. This                            is the presumed source of the patient's recent                            hematochezia.                           - No evidence of persistent or recurrent AVM's in                            the colon.                           - One 3 mm polyp in the ascending colon, not                            removed.                           - The distal rectum and anal verge are normal on                            retroflexion view.                           -  No specimens collected. Recommendation:           - Repeat routine colonoscopy is not recommended due                            to current age (45 years or older) for screening                            purposes.                           - It is presumably safe to resume Plavix                            (clopidogrel) at prior dose in 1 week; by then,                            there should be sufficient (presumed diverticular)                            clot maturation that resumption of that medication                            will not prompt re-bleeding. However, the risks vs.                            the benefit of ongoing antiplatelet therapy in this                            patient with history of GI blood loss and extensive                            diverticulosis will need to be taken into                            consideration. Procedure Code(s):        --- Professional ---                           [phone], Colonoscopy, flexible; diagnostic, including                            collection of specimen(s) by brushing or washing,                            when performed (separate procedure) Diagnosis Code(s):        --- Professional ---                           K92.1, Melena (includes  Hematochezia)                           D62, Acute posthemorrhagic anemia                           K57.30, Diverticulosis  of large intestine without                            perforation or abscess without bleeding CPT copyright 2019 American Medical Association. All rights reserved. The codes documented in this report are preliminary and upon coder review may  be revised to meet current compliance requirements. Ronald Lobo, MD 07/06/2020 8:29:44 AM This report has been signed electronically. Number of Addenda: 0

## 2020-07-06 NOTE — Anesthesia Procedure Notes (Signed)
Procedure Name: MAC Date/Time: 07/06/2020 7:35 AM Performed by: Georgia Duff, CRNA Pre-anesthesia Checklist: Patient identified Oxygen Delivery Method: Nasal cannula Dental Injury: Teeth and Oropharynx as per pre-operative assessment

## 2020-07-06 NOTE — Anesthesia Postprocedure Evaluation (Signed)
Anesthesia Post Note  Patient: Nicholas [name]  Procedure(s) Performed: ESOPHAGOGASTRODUODENOSCOPY (EGD) WITH PROPOFOL COLONOSCOPY WITH PROPOFOL     Patient location during evaluation: Endoscopy Anesthesia Type: MAC Level of consciousness: awake and alert Pain management: pain level controlled Vital Signs Assessment: post-procedure vital signs reviewed and stable Respiratory status: spontaneous breathing, nonlabored ventilation, respiratory function stable and patient connected to nasal cannula oxygen Cardiovascular status: blood pressure returned to baseline and stable Postop Assessment: no apparent nausea or vomiting Anesthetic complications: no   No notable events documented.  Last Vitals:  Vitals:   07/06/20 0818 07/06/20 0829  BP: 134/61 (!) 126/51  Pulse: 72 86  Resp: 12 (!) 21  Temp: 36.5 C   SpO2: 100% 100%    Last Pain:  Vitals:   07/06/20 0829  TempSrc:   PainSc: 0-No pain                 Barnet Glasgow

## 2020-07-06 NOTE — Progress Notes (Signed)
Hypoglycemic Event  CBG: 45  Treatment: 8 oz juice/soda  Symptoms: None  Follow-up CBG: Time:1750 CBG Result:54  Treatment 15g glucose gel  Possible Reasons for Event: Medication regimen: 20 unit novolog given earlier today  Comments/MD notified:yes     Nicholas [name] Carlisle Beers

## 2020-07-06 NOTE — Progress Notes (Signed)
Current cbg 91

## 2020-07-06 NOTE — Progress Notes (Signed)
The patient's endoscopy and colonoscopy were very well-tolerated.  He had an incredibly good prep for the colonoscopy.  There was no blood whatsoever anywhere in the GI tract, so this patient has clearly stopped bleeding.  The presumed source of his recent maroon stools is his extensive diverticulosis.  Clinically, this was compatible with a diverticular bleed, and no alternative source of blood loss was seen in either the upper tract or the lower tract, specifically, no AVMs, inflammation, or ulcerations.  Recommendations:  1.  I have ordered a diet for the patient  2.  Because diverticular bleeding can recur after a day or 2 of quiescence, I would favor watching the patient 1 more day in the hospital, taking into account his age and his multiple medical conditions, plus the fact that there is still some residual Plavix effect in his bloodstream, plus the fact that he was having hematochezia and syncope just 2 days ago.  3.  Concerning resumption of Plavix, there are the issues of both the short-term and the long-term safety of that medication.   --In the short-term, if it is desired to restart that medication, ideally we would wait 1 week from now before resuming it, for the purpose of clot maturation (presumably, this was a diverticular bleed that has spontaneously clotted off).   --However, the long-term benefit, versus risk, of antiplatelet therapy in this patient with residual coronary disease needs to be weighed against the risk of recurrent GI bleeding.  Realistically, I think it would be reasonable to resume antiplatelet therapy in this patient, all things considered, since he does not appear to have a GI tract lesion that is at very high risk for recurrent bleeding (I would estimate that recurrent diverticular bleeding risk is probably on the order of 20% in the next 5 years, and diverticular bleeding would probably be a lesser threat to his health than a coronary occlusion; moreover,  diverticular bleeding can recur even in the absence of antiplatelet therapy).  4.  Since his upper endoscopy was normal, I have stopped the patient's Protonix and I do not think that needs to be prescribed as an outpatient (he was not on it when he came into the hospital).  5.  I will sign off.  Please let me know if I can be of further assistance with this patient's care.  Nicholas [name], M.D. Pager [phone] If no answer or after 5 PM call [phone]

## 2020-07-07 LAB — RENAL FUNCTION PANEL
Albumin: 2.4 g/dL — ABNORMAL LOW (ref 3.5–5.0)
Anion gap: 12 (ref 5–15)
BUN: 29 mg/dL — ABNORMAL HIGH (ref 8–23)
CO2: 26 mmol/L (ref 22–32)
Calcium: 8.5 mg/dL — ABNORMAL LOW (ref 8.9–10.3)
Chloride: 94 mmol/L — ABNORMAL LOW (ref 98–111)
Creatinine, Ser: 7.18 mg/dL — ABNORMAL HIGH (ref 0.61–1.24)
GFR, Estimated: 7 mL/min — ABNORMAL LOW (ref >60)
Glucose, Bld: 168 mg/dL — ABNORMAL HIGH (ref 70–99)
Phosphorus: 4.5 mg/dL (ref 2.5–4.6)
Potassium: 3.5 mmol/L (ref 3.5–5.1)
Sodium: 132 mmol/L — ABNORMAL LOW (ref 135–145)

## 2020-07-07 LAB — PREPARE RBC (CROSSMATCH)

## 2020-07-07 LAB — CBC
HCT: 20 % — ABNORMAL LOW (ref 39.0–52.0)
HCT: 26.7 % — ABNORMAL LOW (ref 39.0–52.0)
Hemoglobin: 6.7 g/dL — CL (ref 13.0–17.0)
Hemoglobin: 8.7 g/dL — ABNORMAL LOW (ref 13.0–17.0)
MCH: 30.7 pg (ref 26.0–34.0)
MCH: 30.1 pg (ref 26.0–34.0)
MCHC: 33.5 g/dL (ref 30.0–36.0)
MCHC: 32.6 g/dL (ref 30.0–36.0)
MCV: 91.7 fL (ref 80.0–100.0)
MCV: 92.4 fL (ref 80.0–100.0)
Platelets: 223 10*3/uL (ref 150–400)
Platelets: 230 10*3/uL (ref 150–400)
RBC: 2.18 MIL/uL — ABNORMAL LOW (ref 4.22–5.81)
RBC: 2.89 MIL/uL — ABNORMAL LOW (ref 4.22–5.81)
RDW: 14.5 % (ref 11.5–15.5)
RDW: 14.6 % (ref 11.5–15.5)
WBC: 6.1 10*3/uL (ref 4.0–10.5)
WBC: 5.6 10*3/uL (ref 4.0–10.5)
nRBC: 0 % (ref 0.0–0.2)
nRBC: 0 % (ref 0.0–0.2)

## 2020-07-07 LAB — GLUCOSE, CAPILLARY
Glucose-Capillary: 195 mg/dL — ABNORMAL HIGH (ref 70–99)
Glucose-Capillary: 104 mg/dL — ABNORMAL HIGH (ref 70–99)
Glucose-Capillary: 199 mg/dL — ABNORMAL HIGH (ref 70–99)
Glucose-Capillary: 144 mg/dL — ABNORMAL HIGH (ref 70–99)

## 2020-07-07 LAB — HEPATITIS B SURFACE ANTIBODY,QUALITATIVE: Hep B S Ab: REACTIVE — AB

## 2020-07-07 LAB — HEPATITIS B CORE ANTIBODY, TOTAL: Hep B Core Total Ab: NONREACTIVE

## 2020-07-07 LAB — HEPATITIS B SURFACE ANTIGEN: Hepatitis B Surface Ag: NONREACTIVE

## 2020-07-07 MED ORDER — SODIUM CHLORIDE 0.9% IV SOLUTION: Freq: Once | INTRAVENOUS | Status: DC

## 2020-07-07 MED ORDER — INSULIN ASPART PROT & ASPART (70-30 MIX) 100 UNIT/ML ~~LOC~~ SUSP
14.0000 [IU] | Freq: Two times a day (BID) | SUBCUTANEOUS | Status: DC
  Filled 2020-07-07: qty 10

## 2020-07-07 MED ORDER — SODIUM CHLORIDE 0.9 % IV SOLN: 100.0000 mL | INTRAVENOUS | Status: DC | PRN

## 2020-07-07 MED ORDER — PENTAFLUOROPROP-TETRAFLUOROETH EX AERO: 1.0000 "application " | INHALATION_SPRAY | CUTANEOUS | Status: DC | PRN

## 2020-07-07 MED ORDER — LIDOCAINE HCL (PF) 1 % IJ SOLN: 5.0000 mL | INTRAMUSCULAR | Status: DC | PRN

## 2020-07-07 MED ORDER — NEPRO/CARBSTEADY PO LIQD: 237.0000 mL | ORAL | Status: DC | PRN

## 2020-07-07 MED ORDER — INSULIN ASPART PROT & ASPART (70-30 MIX) 100 UNIT/ML ~~LOC~~ SUSP
14.0000 [IU] | Freq: Two times a day (BID) | SUBCUTANEOUS | Status: DC
  Administered 2020-07-07: 14 [IU] via SUBCUTANEOUS
  Filled 2020-07-07: qty 10

## 2020-07-07 MED ORDER — LIDOCAINE-PRILOCAINE 2.5-2.5 % EX CREA: 1.0000 "application " | TOPICAL_CREAM | CUTANEOUS | Status: DC | PRN

## 2020-07-07 NOTE — Progress Notes (Signed)
[facility] Progress Note   Subjective: seen in room. No C/Os. HD today on schedule.   Objective Vitals:   07/07/20 1053 07/07/20 1108 07/07/20 1123 07/07/20 1138  BP: (!) 116/59 (!) 119/57 126/70 127/62  Pulse:      Resp: 18 18 15 17   Temp:      TempSrc:      SpO2:      Weight:      Height:       Physical Exam General: Pleasant elderly male in NAD Heart: S1,S2 RRR No M/R/G Lungs: CTAB Abdomen: S, NT Extremities: No LE edema Dialysis Access: L AVF +T/B                  Additional Objective Labs: Basic Metabolic Panel: Recent Labs  Lab 07/04/20 1000 07/04/20 1825 07/06/20 1720  NA 139  --   --   K 4.8  --   --   CL 96*  --   --   CO2 30  --   --   GLUCOSE 253*  --  52*  BUN 35*  --   --   CREATININE 7.06*  --   --   CALCIUM 9.3  --   --   PHOS  --  4.6  --    Liver Function Tests: Recent Labs  Lab 07/04/20 1000  AST 11*  ALT 12  ALKPHOS 61  BILITOT 0.5  PROT 6.4*  ALBUMIN 2.7*   Recent Labs  Lab 07/04/20 1205  LIPASE 39   CBC: Recent Labs  Lab 07/04/20 1000 07/05/20 0455 07/06/20 0236 07/06/20 1720 07/07/20 0634  WBC 11.4*  --  8.4 8.3 6.1  HGB 6.6*   < > 8.1* 7.7* 6.7*  HCT 21.7*   < > 25.2* 23.4* 20.0*  MCV 95.6  --  95.1 92.9 91.7  PLT 308  --  245 285 223   < > = values in this interval not displayed.   Blood Culture    Component Value Date/Time   SDES PLEURAL FLUID 06/08/2020 0839   SPECREQUEST LUNG RIGHT 06/08/2020 0839   CULT  06/08/2020 0839    NO GROWTH 3 DAYS Performed at [facility], [place] [address], [place], [place] [address]    REPTSTATUS 06/11/2020 FINAL 06/08/2020 0839    Cardiac Enzymes: No results for input(s): CKTOTAL, CKMB, CKMBINDEX, TROPONINI in the last 168 hours. CBG: Recent Labs  Lab 07/06/20 1709 07/06/20 1744 07/06/20 1835 07/06/20 2015 07/07/20 0641  GLUCAP 45* 54* 91 134* 195*   Iron Studies: No results for input(s): IRON, TIBC, TRANSFERRIN, FERRITIN in the last 72  hours. @lablastinr3 @ Studies/Results: DG CHEST PORT 1 VIEW  Result Date: 07/06/2020 CLINICAL DATA:  Right-sided pleural effusion EXAM: PORTABLE CHEST 1 VIEW COMPARISON:  06/08/2020 FINDINGS: Cardiac shadow is enlarged but stable. Loop recorder is again noted. Aortic calcifications are seen. Left lung is clear. Persistent right-sided effusion and atelectatic changes are noted stable from the prior exam. No new focal abnormality is seen. IMPRESSION: Stable right effusion with basilar atelectasis. Electronically Signed   By: Inez Catalina M.D.   On: 07/06/2020 10:51   ECHOCARDIOGRAM COMPLETE  Result Date: 07/05/2020    ECHOCARDIOGRAM REPORT   Patient Name:   Nicholas [name] Date of Exam: 07/05/2020 Medical Rec #:  [id number]    Height:       68.0 in Accession #:    [id number]   Weight:       155.4 lb Date of Birth:  10-08-1930    BSA:          1.836 m Patient Age:    87 years     BP:           123/60 mmHg Patient Gender: M            HR:           79 bpm. Exam Location:  Inpatient Procedure: 2D Echo, Cardiac Doppler and Color Doppler Indications:    Shortness of breath  History:        Patient has prior history of Echocardiogram examinations, most                 recent 03/18/2019. CHF, CAD, COPD; Risk Factors:Former Smoker.                 ESRD, GI bleed, anemia.  Sonographer:    Dustin Flock Referring Phys: [id number] [place]  1. Left ventricular ejection fraction, by estimation, is 60 to 65%. The left ventricle has normal function. Left ventricular endocardial border not optimally defined to evaluate regional wall motion. There is mild left ventricular hypertrophy. Left ventricular diastolic parameters are consistent with Grade I diastolic dysfunction (impaired relaxation). Elevated left ventricular end-diastolic pressure.  2. Right ventricular systolic function is normal. The right ventricular size is normal. There is normal pulmonary artery systolic pressure. The estimated right ventricular  systolic pressure is 93.2 mmHg.  3. Left atrial size was mildly dilated.  4. The mitral valve is normal in structure. No evidence of mitral valve regurgitation. No evidence of mitral stenosis.  5. The aortic valve has an indeterminant number of cusps. Aortic valve regurgitation is not visualized. Mild aortic valve sclerosis is present, with no evidence of aortic valve stenosis.  6. The inferior vena cava is normal in size with greater than 50% respiratory variability, suggesting right atrial pressure of 3 mmHg. FINDINGS  Left Ventricle: Left ventricular ejection fraction, by estimation, is 60 to 65%. The left ventricle has normal function. Left ventricular endocardial border not optimally defined to evaluate regional wall motion. The left ventricular internal cavity size was normal in size. There is mild left ventricular hypertrophy. Left ventricular diastolic parameters are consistent with Grade I diastolic dysfunction (impaired relaxation). Elevated left ventricular end-diastolic pressure. Right Ventricle: The right ventricular size is normal. No increase in right ventricular wall thickness. Right ventricular systolic function is normal. There is normal pulmonary artery systolic pressure. The tricuspid regurgitant velocity is 2.39 m/s, and  with an assumed right atrial pressure of 3 mmHg, the estimated right ventricular systolic pressure is 67.1 mmHg. Left Atrium: Left atrial size was mildly dilated. Right Atrium: Right atrial size was normal in size. Pericardium: There is no evidence of pericardial effusion. Mitral Valve: The mitral valve is normal in structure. There is mild calcification of the mitral valve leaflet(s). Mild mitral annular calcification. No evidence of mitral valve regurgitation. No evidence of mitral valve stenosis. Tricuspid Valve: The tricuspid valve is normal in structure. Tricuspid valve regurgitation is trivial. No evidence of tricuspid stenosis. Aortic Valve: The aortic valve has an  indeterminant number of cusps. Aortic valve regurgitation is not visualized. Mild aortic valve sclerosis is present, with no evidence of aortic valve stenosis. Pulmonic Valve: The pulmonic valve was normal in structure. Pulmonic valve regurgitation is not visualized. No evidence of pulmonic stenosis. Aorta: The aortic root is normal in size and structure. Venous: The inferior vena cava is normal in size with greater than 50% respiratory  variability, suggesting right atrial pressure of 3 mmHg. IAS/Shunts: No atrial level shunt detected by color flow Doppler.  LEFT VENTRICLE PLAX 2D LVIDd:         3.90 cm  Diastology LVIDs:         3.40 cm  LV e' medial:    3.37 cm/s LV PW:         1.20 cm  LV E/e' medial:  35.9 LV IVS:        1.20 cm  LV e' lateral:   5.00 cm/s LVOT diam:     2.20 cm  LV E/e' lateral: 24.2 LV SV:         91 LV SV Index:   49 LVOT Area:     3.80 cm  RIGHT VENTRICLE RV Basal diam:  2.40 cm RV S prime:     12.50 cm/s TAPSE (M-mode): 2.5 cm LEFT ATRIUM             Index       RIGHT ATRIUM           Index LA diam:        5.10 cm 2.78 cm/m  RA Area:     16.60 cm LA Vol (A2C):   55.2 ml 30.06 ml/m RA Volume:   42.40 ml  23.09 ml/m LA Vol (A4C):   70.6 ml 38.45 ml/m LA Biplane Vol: 64.8 ml 35.29 ml/m  AORTIC VALVE LVOT Vmax:   127.00 cm/s LVOT Vmean:  76.500 cm/s LVOT VTI:    0.239 m MITRAL VALVE                TRICUSPID VALVE MV Area (PHT): 3.66 cm     TR Peak grad:   22.8 mmHg MV Decel Time: 207 msec     TR Vmax:        239.00 cm/s MV E velocity: 121.00 cm/s MV A velocity: 165.00 cm/s  SHUNTS MV E/A ratio:  0.73         Systemic VTI:  0.24 m                             Systemic Diam: 2.20 cm Fransico Him MD Electronically signed by Fransico Him MD Signature Date/Time: 07/05/2020/1:18:10 PM    Final    Medications:  sodium chloride     sodium chloride     sodium chloride      sodium chloride   Intravenous Once   sodium chloride   Intravenous Once   sodium chloride   Intravenous Once    atorvastatin  80 mg Oral QHS   Chlorhexidine Gluconate Cloth  6 each Topical Q0600   feeding supplement (NEPRO CARB STEADY)  237 mL Oral Daily   insulin aspart  0-5 Units Subcutaneous QHS   insulin aspart  0-6 Units Subcutaneous TID WC   insulin aspart protamine- aspart  14 Units Subcutaneous BID WC   isosorbide dinitrate  20 mg Oral BID   lidocaine-prilocaine  1 application Topical Q T,Th,Sa-HD   methocarbamol  500 mg Oral TID   multivitamin  1 tablet Oral Daily   sevelamer carbonate  1,600 mg Oral TID WC   sodium chloride flush  3 mL Intravenous Q12H     OP HD: [facility], dry wt 73kg in May       Assessment/ Plan: GI bleed - presented w/ rectal bleeding and syncopal episode. Admit Hb 6.6. Repeat this am down to  6.3.  No further rectal bleeding here per pt. Went for endo today, no source of bleeding found. Per GI, possible diverticular bleed which stopped spontaneously. HGB stable at 8.1 today.  ESRD - on HD TTS. Has not missed HD. Had HD early this am. Next HD 6/14.  BP/ volume - BP's low-normal here, not on any BP lowering meds at home. Min UF on HD overnight.  No vol^ on exam. CAD - extensive hx of CAD, had PCI 07/2018. Had [place] here during admit May 2022, no new intervention, medical Rx. MBD ckd - cont binder (not sure which one per pt) Anemia ckd - get records Monday from HD unit. S/P 1 unit PRBCs 07/05/2020. HGB 6.7 today. Transfuse 1 unit PRBCS in HD today.   Breiana Stratmann H. Ahava Kissoon NP-C 07/07/2020, 12:13 PM  [employer] [phone]

## 2020-07-07 NOTE — TOC Initial Note (Signed)
Transition of Care [facility]) - Initial/Assessment Note    Patient Details  Name: Nicholas [name] MRN: [id number] Date of Birth: 10-Oct-1930  [facility]) CM/SW Contact:    Carles Collet, RN Phone Number: 07/07/2020, 4:44 PM  Clinical Narrative:       Patient from home w spouse. Following Hgb, EGD today. If remains stable, potential DC in AM. HD TTS.  Most recently active w [facility] per ping in 2021, no HH needs identified at this time. No TOC needs identified at this time.  [facility] [facility]- Medicare            Expected Discharge Plan: Home/Self Care Barriers to Discharge: Continued Medical Work up   Patient Goals and CMS Choice        Expected Discharge Plan and Services Expected Discharge Plan: Home/Self Care   Discharge Planning Services: CM Consult   Living arrangements for the past 2 months: Single Family Home                                      Prior Living Arrangements/Services Living arrangements for the past 2 months: Single Family Home Lives with:: Spouse                   Activities of Daily Living Home Assistive Devices/Equipment: Scales, Cane (specify quad or straight), Eyeglasses, Grab bars in shower, Oxygen, Grab bars around toilet, CBG Meter, Shower chair without back, Blood pressure cuff ADL Screening (condition at time of admission) Patient's cognitive ability adequate to safely complete daily activities?: Yes Is the patient deaf or have difficulty hearing?: No Does the patient have difficulty seeing, even when wearing glasses/contacts?: Yes Does the patient have difficulty concentrating, remembering, or making decisions?: No Patient able to express need for assistance with ADLs?: Yes Does the patient have difficulty dressing or bathing?: Yes Independently performs ADLs?: No Communication: Independent Dressing (OT): Needs assistance Is this a change from baseline?: Pre-admission baseline Grooming: Needs  assistance Is this a change from baseline?: Pre-admission baseline Feeding: Dependent Is this a change from baseline?: Pre-admission baseline Bathing: Needs assistance Is this a change from baseline?: Pre-admission baseline Toileting: Needs assistance Is this a change from baseline?: Pre-admission baseline In/Out Bed: Needs assistance Is this a change from baseline?: Pre-admission baseline Walks in Home: Needs assistance Is this a change from baseline?: Pre-admission baseline Does the patient have difficulty walking or climbing stairs?: Yes Weakness of Legs: Both Weakness of Arms/Hands: None  Permission Sought/Granted                  Emotional Assessment              Admission diagnosis:  Acute blood loss anemia [D62] Gastrointestinal hemorrhage, unspecified gastrointestinal hemorrhage type [K92.2] Syncope, unspecified syncope type [R55] Patient Active Problem List   Diagnosis Date Noted   Acute blood loss anemia 07/04/2020   Left renal mass 06/06/2020   Chest pain 06/05/2020   Hypotension    Syncope and collapse 03/17/2019   Acute anemia 09/28/2018   GIB (gastrointestinal bleeding) 09/27/2018   Anemia due to blood loss 09/27/2018   Type II diabetes mellitus with renal manifestations ([place]) 09/27/2018   GERD (gastroesophageal reflux disease) 09/27/2018   CAD (coronary artery disease) 01/26/7251   Chronic systolic CHF (congestive heart failure) ([place]) 09/27/2018   NSTEMI (non-ST elevated myocardial infarction) ([place]) 08/06/2018   Hypertension    Hypercholesteremia  ESRD (end stage renal disease) on dialysis [facility])    Anemia of chronic disease    Lung nodule seen on imaging study 04/30/2012   PCP:  Clinic, Brenda:   [place], [place] - [place] [address] [place] [address] Phone: [phone] Fax: [phone]  [facility] [place] [id number] - [place],  [address] [place] [address] Phone: [phone] Fax: [phone]     Social Determinants of Health (SDOH) Interventions    Readmission Risk Interventions No flowsheet data found.

## 2020-07-07 NOTE — Progress Notes (Signed)
PROGRESS NOTE    Nicholas [name]  DZH:[id number] DOB: [birth year] DOA: 07/04/2020 PCP: [facility]   Brief Narrative:  Nicholas [name] is a 87 y.o. male with medical history significant of  ESRD (HD TTS), HTN, HLD, CHF, ACD with baseline hemoglobin around 7-8, hx of CAD s/p PCI 08/13/2018 with stents x2 on plavix.  Recently admitted on Jun 04, 2020 for chest pain and found to have acute on chronic anemia with a hemoglobin of 5.7. given 1 unit of packed red blood cells with improvement of his hemoglobin to baseline.  He had no evidence of GI bleeding.  He presented to ER for a couple episodes of bright red blood in stool with subsequent syncope in his chair with loss of consciousness for 1 minute.  Wife called 911.  denies any urine incontinence or tongue biting during this episode. No history of abnormal colonoscopies. Last colonoscopy 02/03/19 with cauterization of cecal angiodysplasia> egd showed gastric avms s?p ost apc on 1/8 Upon arrival to ED, he was hemodynamically stable but his hemoglobin was 6.6.  He was admitted under hospital service, nephrology was consulted.  He underwent hemodialysis.  His hemoglobin dropped to 6.3.  He was transfused 1 unit of baby aspirin.  GI was consulted and he underwent EGD and colonoscopy which was completely unremarkable.    Assessment & Plan:   Principal Problem:   Acute blood loss anemia Active Problems:   Hypertension   Hypercholesteremia   ESRD (end stage renal disease) on dialysis (HCC)   Anemia of chronic disease   GIB (gastrointestinal bleeding)   Type II diabetes mellitus with renal manifestations (HCC)   CAD (coronary artery disease)   Chronic systolic CHF (congestive heart failure) (HCC)   Acute blood loss anemia on top of anemia of chronic disease due to ESRD/bright red blood per rectum/rectal bleeding -hemoglobin of 6.6 upon admission, baseline appears to be around 7-8.  8 dropped to 6.3. 1 Unit of PRBC was transfused on 07/05/2020.   Underwent EGD and colonoscopy on 07/06/2020 and that was completely unremarkable or negative for any bleeding.  GI opined that his bleeding was likely diverticular which stopped spontaneously.  Protonix discontinued.  Started and tolerated diet.  Was kept yesterday for observation and today his hemoglobin dropped to 6.7 again.  Will transfuse 1 unit with dialysis today and watch another night.  Continue to hold Plavix and resume after week per GI recommendation.   Syncope -Secondary to anemia which might have caused hypotension temporarily.  Doing well now.  Monitor.    Right pleural effusion/chronic hypoxic respiratory failure: Patient and his wife mentioned yesterday that they were called by patient's PCP.  Patient apparently had a CT chest about a week ago which shows pleural effusion.  Patient's PCP has recommended that patient sees pulmonologist in the hospital before discharge.  Patient denies any shortness of breath and he is on 2 L of oxygen which is his baseline.  He did have slightly decreased breath sounds on the right base.  I had a lengthy discussion with the wife and I recommended that we at least start with a chest x-ray.  Fortunately chest x-ray shows very small pleural effusion on the right which is very stable compared to an x-ray a month ago post thoracentesis while he was here at [facility].  Based off of the fact that it is small, stable, he is not requiring more oxygen than his baseline and he is not having respiratory symptoms, there is no indication  of pulmonary consult or thoracentesis.  I expect his small pleural effusion to get better over time with dialysis.  I had discussed my findings and plan of care with the wife again over the phone.  She was agreeable with the plan.   Hypertension: Controlled, continue current home medications.  Hyperlipidemia: Continue statin.     ESRD (end stage renal disease) on dialysis [employer]) Dialysis per nephrology.  TTS schedule.   CAD S/p PCI  07/2018 on plavix.  Recently admitted for unstable angina status postcardiac cath on 5/13.  Did not want to undergo CABG and recommended medical management. -hold plavix/ASA with acute bleed. -continue tele  -Continue statin and isosorbide     Type II diabetes mellitus with renal manifestations (HCC) -Recent A1c of 7.9 Patient had persistent hypoglycemia yesterday.  We will reduce his dose of 70/30 from 20units to 14 units twice daily and continue SSI.     Chronic systolic CHF (congestive heart failure) (HCC) -Strict I's and O's -Daily weights -Last echo March 18, 2019 with EF of 60 to 65%.  Grade 1 diastolic dysfunction.  Elevated left ventricular end-diastolic pressure -repeat echo shows normal ejection fraction with grade 1 diastolic dysfunction.  No wall motion abnormality.  Appears euvolemic.  DVT prophylaxis: SCDs Start: 07/04/20 1721   Code Status: Full Code  Family Communication:  None present at bedside.  Plan of care discussed with patient in length and he verbalized understanding and agreed with it.    Status is: Inpatient  Remains inpatient appropriate because:Ongoing diagnostic testing needed not appropriate for outpatient work up  Dispo: The patient is from: Home              Anticipated d/c is to: Home              Patient currently is not medically stable to d/c.   Difficult to place patient No   Estimated body mass index is 24.3 kg/m as calculated from the following:   Height as of this encounter: 5\' 8"  (1.727 m).   Weight as of this encounter: 72.5 kg.     Nutritional status:             Consultants:  GI and nephrology  Procedures:  EGD and colonoscopy  Antimicrobials:  Anti-infectives (From admission, onward)    None         Subjective: Seen and examined.  Patient had mention yesterday that he wanted to be discharged early morning today so he can have his dialysis at his outpatient clinic with the [place] at 11 however unfortunately, his  hemoglobin dropped to 6.7.  He has not had any further rectal bleeding.  This is likely residual secondary to GI bleed that he had few days ago.  Discussed with him that he will need transfusion and observation overnight.  He is agreeable.  Objective: Vitals:   07/07/20 0953 07/07/20 1008 07/07/20 1023 07/07/20 1038  BP: (!) 114/59 (!) 111/52 128/61 (!) 118/56  Pulse:  75    Resp: 18 15 (!) 21 20  Temp:  (!) 97.5 F (36.4 C)    TempSrc:  Oral    SpO2:  99%    Weight:      Height:        Intake/Output Summary (Last 24 hours) at 07/07/2020 1055 Last data filed at 07/07/2020 1008 Gross per 24 hour  Intake 1193 ml  Output 0 ml  Net 1193 ml    Filed Weights   07/04/20 2230 07/06/20 0716  07/07/20 0815  Weight: 70.5 kg 70.5 kg 72.5 kg    Examination:  General exam: Appears calm and comfortable  Respiratory system: Slightly diminished breath sounds at the right base, respiratory effort normal. Cardiovascular system: S1 & S2 heard, RRR. No JVD, murmurs, rubs, gallops or clicks. No pedal edema. Gastrointestinal system: Abdomen is nondistended, soft and nontender. No organomegaly or masses felt. Normal bowel sounds heard. Central nervous system: Alert and oriented. No focal neurological deficits. Extremities: Symmetric 5 x 5 power. Skin: No rashes, lesions or ulcers.  Psychiatry: Judgement and insight appear normal. Mood & affect appropriate.    Data Reviewed: I have personally reviewed following labs and imaging studies  CBC: Recent Labs  Lab 07/04/20 1000 07/05/20 0455 07/06/20 0236 07/06/20 1720 07/07/20 0634  WBC 11.4*  --  8.4 8.3 6.1  HGB 6.6* 6.3* 8.1* 7.7* 6.7*  HCT 21.7* 19.6* 25.2* 23.4* 20.0*  MCV 95.6  --  95.1 92.9 91.7  PLT 308  --  245 285 675    Basic Metabolic Panel: Recent Labs  Lab 07/04/20 1000 07/04/20 1825 07/06/20 1720  NA 139  --   --   K 4.8  --   --   CL 96*  --   --   CO2 30  --   --   GLUCOSE 253*  --  52*  BUN 35*  --   --    CREATININE 7.06*  --   --   CALCIUM 9.3  --   --   MG  --  2.2  --   PHOS  --  4.6  --     GFR: Estimated Creatinine Clearance: 7.1 mL/min (A) (by C-G formula based on SCr of 7.06 mg/dL (H)). Liver Function Tests: Recent Labs  Lab 07/04/20 1000  AST 11*  ALT 12  ALKPHOS 61  BILITOT 0.5  PROT 6.4*  ALBUMIN 2.7*    Recent Labs  Lab 07/04/20 1205  LIPASE 39    No results for input(s): AMMONIA in the last 168 hours. Coagulation Profile: No results for input(s): INR, PROTIME in the last 168 hours. Cardiac Enzymes: No results for input(s): CKTOTAL, CKMB, CKMBINDEX, TROPONINI in the last 168 hours. BNP (last 3 results) No results for input(s): PROBNP in the last 8760 hours. HbA1C: No results for input(s): HGBA1C in the last 72 hours. CBG: Recent Labs  Lab 07/06/20 1709 07/06/20 1744 07/06/20 1835 07/06/20 2015 07/07/20 0641  GLUCAP 45* 54* 91 134* 195*    Lipid Profile: No results for input(s): CHOL, HDL, LDLCALC, TRIG, CHOLHDL, LDLDIRECT in the last 72 hours. Thyroid Function Tests: No results for input(s): TSH, T4TOTAL, FREET4, T3FREE, THYROIDAB in the last 72 hours. Anemia Panel: No results for input(s): VITAMINB12, FOLATE, FERRITIN, TIBC, IRON, RETICCTPCT in the last 72 hours. Sepsis Labs: No results for input(s): PROCALCITON, LATICACIDVEN in the last 168 hours.  Recent Results (from the past 240 hour(s))  Resp Panel by RT-PCR (Flu A&B, Covid) Nasopharyngeal Swab     Status: None   Collection Time: 07/04/20 12:50 PM   Specimen: Nasopharyngeal Swab; Nasopharyngeal(NP) swabs in vial transport medium  Result Value Ref Range Status   SARS Coronavirus 2 by RT PCR NEGATIVE NEGATIVE Final    Comment: (NOTE) SARS-CoV-2 target nucleic acids are NOT DETECTED.  The SARS-CoV-2 RNA is generally detectable in upper respiratory specimens during the acute phase of infection. The lowest concentration of SARS-CoV-2 viral copies this assay can detect is 138 copies/mL. A  negative result does not  preclude SARS-Cov-2 infection and should not be used as the sole basis for treatment or other patient management decisions. A negative result may occur with  improper specimen collection/handling, submission of specimen other than nasopharyngeal swab, presence of viral mutation(s) within the areas targeted by this assay, and inadequate number of viral copies(<138 copies/mL). A negative result must be combined with clinical observations, patient history, and epidemiological information. The expected result is Negative.  Fact Sheet for Patients:  EntrepreneurPulse.com.au  Fact Sheet for Healthcare Providers:  IncredibleEmployment.be  This test is no t yet approved or cleared by the [place] FDA and  has been authorized for detection and/or diagnosis of SARS-CoV-2 by FDA under an Emergency Use Authorization (EUA). This EUA will remain  in effect (meaning this test can be used) for the duration of the COVID-19 declaration under Section 564(b)(1) of the Act, 21 U.S.C.section 360bbb-3(b)(1), unless the authorization is terminated  or revoked sooner.       Influenza A by PCR NEGATIVE NEGATIVE Final   Influenza B by PCR NEGATIVE NEGATIVE Final    Comment: (NOTE) The Xpert Xpress SARS-CoV-2/FLU/RSV plus assay is intended as an aid in the diagnosis of influenza from Nasopharyngeal swab specimens and should not be used as a sole basis for treatment. Nasal washings and aspirates are unacceptable for Xpert Xpress SARS-CoV-2/FLU/RSV testing.  Fact Sheet for Patients: EntrepreneurPulse.com.au  Fact Sheet for Healthcare Providers: IncredibleEmployment.be  This test is not yet approved or cleared by the [place] FDA and has been authorized for detection and/or diagnosis of SARS-CoV-2 by FDA under an Emergency Use Authorization (EUA). This EUA will remain in effect (meaning this test can  be used) for the duration of the COVID-19 declaration under Section 564(b)(1) of the Act, 21 U.S.C. section 360bbb-3(b)(1), unless the authorization is terminated or revoked.  Performed at [facility], [place] [address], [place], [place] [address]        Radiology Studies: DG CHEST PORT 1 VIEW  Result Date: 07/06/2020 CLINICAL DATA:  Right-sided pleural effusion EXAM: PORTABLE CHEST 1 VIEW COMPARISON:  06/08/2020 FINDINGS: Cardiac shadow is enlarged but stable. Loop recorder is again noted. Aortic calcifications are seen. Left lung is clear. Persistent right-sided effusion and atelectatic changes are noted stable from the prior exam. No new focal abnormality is seen. IMPRESSION: Stable right effusion with basilar atelectasis. Electronically Signed   By: Inez Catalina M.D.   On: 07/06/2020 10:51   ECHOCARDIOGRAM COMPLETE  Result Date: 07/05/2020    ECHOCARDIOGRAM REPORT   Patient Name:   AYLAN BAYONA Date of Exam: 07/05/2020 Medical Rec #:  [id number]    Height:       68.0 in Accession #:    [id number]   Weight:       155.4 lb Date of Birth:  1930/09/24    BSA:          1.836 m Patient Age:    61 years     BP:           123/60 mmHg Patient Gender: M            HR:           79 bpm. Exam Location:  Inpatient Procedure: 2D Echo, Cardiac Doppler and Color Doppler Indications:    Shortness of breath  History:        Patient has prior history of Echocardiogram examinations, most                 recent  03/18/2019. CHF, CAD, COPD; Risk Factors:Former Smoker.                 ESRD, GI bleed, anemia.  Sonographer:    Dustin Flock Referring Phys: [id number] [place]  1. Left ventricular ejection fraction, by estimation, is 60 to 65%. The left ventricle has normal function. Left ventricular endocardial border not optimally defined to evaluate regional wall motion. There is mild left ventricular hypertrophy. Left ventricular diastolic parameters are consistent with Grade I diastolic  dysfunction (impaired relaxation). Elevated left ventricular end-diastolic pressure.  2. Right ventricular systolic function is normal. The right ventricular size is normal. There is normal pulmonary artery systolic pressure. The estimated right ventricular systolic pressure is 45.4 mmHg.  3. Left atrial size was mildly dilated.  4. The mitral valve is normal in structure. No evidence of mitral valve regurgitation. No evidence of mitral stenosis.  5. The aortic valve has an indeterminant number of cusps. Aortic valve regurgitation is not visualized. Mild aortic valve sclerosis is present, with no evidence of aortic valve stenosis.  6. The inferior vena cava is normal in size with greater than 50% respiratory variability, suggesting right atrial pressure of 3 mmHg. FINDINGS  Left Ventricle: Left ventricular ejection fraction, by estimation, is 60 to 65%. The left ventricle has normal function. Left ventricular endocardial border not optimally defined to evaluate regional wall motion. The left ventricular internal cavity size was normal in size. There is mild left ventricular hypertrophy. Left ventricular diastolic parameters are consistent with Grade I diastolic dysfunction (impaired relaxation). Elevated left ventricular end-diastolic pressure. Right Ventricle: The right ventricular size is normal. No increase in right ventricular wall thickness. Right ventricular systolic function is normal. There is normal pulmonary artery systolic pressure. The tricuspid regurgitant velocity is 2.39 m/s, and  with an assumed right atrial pressure of 3 mmHg, the estimated right ventricular systolic pressure is 09.8 mmHg. Left Atrium: Left atrial size was mildly dilated. Right Atrium: Right atrial size was normal in size. Pericardium: There is no evidence of pericardial effusion. Mitral Valve: The mitral valve is normal in structure. There is mild calcification of the mitral valve leaflet(s). Mild mitral annular calcification. No  evidence of mitral valve regurgitation. No evidence of mitral valve stenosis. Tricuspid Valve: The tricuspid valve is normal in structure. Tricuspid valve regurgitation is trivial. No evidence of tricuspid stenosis. Aortic Valve: The aortic valve has an indeterminant number of cusps. Aortic valve regurgitation is not visualized. Mild aortic valve sclerosis is present, with no evidence of aortic valve stenosis. Pulmonic Valve: The pulmonic valve was normal in structure. Pulmonic valve regurgitation is not visualized. No evidence of pulmonic stenosis. Aorta: The aortic root is normal in size and structure. Venous: The inferior vena cava is normal in size with greater than 50% respiratory variability, suggesting right atrial pressure of 3 mmHg. IAS/Shunts: No atrial level shunt detected by color flow Doppler.  LEFT VENTRICLE PLAX 2D LVIDd:         3.90 cm  Diastology LVIDs:         3.40 cm  LV e' medial:    3.37 cm/s LV PW:         1.20 cm  LV E/e' medial:  35.9 LV IVS:        1.20 cm  LV e' lateral:   5.00 cm/s LVOT diam:     2.20 cm  LV E/e' lateral: 24.2 LV SV:         91 LV SV Index:  49 LVOT Area:     3.80 cm  RIGHT VENTRICLE RV Basal diam:  2.40 cm RV S prime:     12.50 cm/s TAPSE (M-mode): 2.5 cm LEFT ATRIUM             Index       RIGHT ATRIUM           Index LA diam:        5.10 cm 2.78 cm/m  RA Area:     16.60 cm LA Vol (A2C):   55.2 ml 30.06 ml/m RA Volume:   42.40 ml  23.09 ml/m LA Vol (A4C):   70.6 ml 38.45 ml/m LA Biplane Vol: 64.8 ml 35.29 ml/m  AORTIC VALVE LVOT Vmax:   127.00 cm/s LVOT Vmean:  76.500 cm/s LVOT VTI:    0.239 m MITRAL VALVE                TRICUSPID VALVE MV Area (PHT): 3.66 cm     TR Peak grad:   22.8 mmHg MV Decel Time: 207 msec     TR Vmax:        239.00 cm/s MV E velocity: 121.00 cm/s MV A velocity: 165.00 cm/s  SHUNTS MV E/A ratio:  0.73         Systemic VTI:  0.24 m                             Systemic Diam: 2.20 cm Fransico Him MD Electronically signed by Fransico Him MD  Signature Date/Time: 07/05/2020/1:18:10 PM    Final     Scheduled Meds:  sodium chloride   Intravenous Once   sodium chloride   Intravenous Once   atorvastatin  80 mg Oral QHS   Chlorhexidine Gluconate Cloth  6 each Topical Q0600   feeding supplement (NEPRO CARB STEADY)  237 mL Oral Daily   insulin aspart  0-5 Units Subcutaneous QHS   insulin aspart  0-6 Units Subcutaneous TID WC   insulin aspart protamine- aspart  14 Units Subcutaneous BID WC   isosorbide dinitrate  20 mg Oral BID   lidocaine-prilocaine  1 application Topical Q T,Th,Sa-HD   methocarbamol  500 mg Oral TID   multivitamin  1 tablet Oral Daily   sevelamer carbonate  1,600 mg Oral TID WC   sodium chloride flush  3 mL Intravenous Q12H   Continuous Infusions:  sodium chloride     sodium chloride     sodium chloride       LOS: 3 days   Time spent: 30-minute   Darliss Cheney, MD [facility]  07/07/2020, 10:55 AM   How to contact the [facility] Attending or Consulting provider 7A - 7P or covering provider during after hours [place], for this patient?  Check the care team in [facility] and look for a) attending/consulting TRH provider listed and b) the [facility] team listed. Page or secure chat 7A-7P. Log into [email or web address] and use [place]'s universal password to access. If you do not have the password, please contact the hospital operator. Locate the [facility] provider you are looking for under [facility] and page to a number that you can be directly reached. If you still have difficulty reaching the provider, please page the [facility] (Director on Call) for the Hospitalists listed on amion for assistance.

## 2020-07-08 ENCOUNTER — Encounter (HOSPITAL_COMMUNITY): Payer: Self-pay | Admitting: Gastroenterology

## 2020-07-08 DIAGNOSIS — Z794 Long term (current) use of insulin: Secondary | ICD-10-CM

## 2020-07-08 DIAGNOSIS — K922 Gastrointestinal hemorrhage, unspecified: Secondary | ICD-10-CM

## 2020-07-08 DIAGNOSIS — I10 Essential (primary) hypertension: Secondary | ICD-10-CM

## 2020-07-08 DIAGNOSIS — I25118 Atherosclerotic heart disease of native coronary artery with other forms of angina pectoris: Secondary | ICD-10-CM

## 2020-07-08 DIAGNOSIS — I5022 Chronic systolic (congestive) heart failure: Secondary | ICD-10-CM

## 2020-07-08 DIAGNOSIS — E1165 Type 2 diabetes mellitus with hyperglycemia: Secondary | ICD-10-CM

## 2020-07-08 LAB — TYPE AND SCREEN
ABO/RH(D): A NEG
Antibody Screen: NEGATIVE
Unit division: 0
Unit division: 0

## 2020-07-08 LAB — BPAM RBC
Blood Product Expiration Date: 202206262359
Blood Product Expiration Date: 202206302359
ISSUE DATE / TIME: 202206121053
ISSUE DATE / TIME: 202206140935
Unit Type and Rh: 600
Unit Type and Rh: 600

## 2020-07-08 LAB — GLUCOSE, CAPILLARY
Glucose-Capillary: 88 mg/dL (ref 70–99)
Glucose-Capillary: 191 mg/dL — ABNORMAL HIGH (ref 70–99)

## 2020-07-08 LAB — HEMOGLOBIN AND HEMATOCRIT, BLOOD
HCT: 27.6 % — ABNORMAL LOW (ref 39.0–52.0)
Hemoglobin: 8.9 g/dL — ABNORMAL LOW (ref 13.0–17.0)

## 2020-07-08 MED ORDER — CLOPIDOGREL BISULFATE 75 MG PO TABS: 75.0000 mg | ORAL_TABLET | Freq: Every day | ORAL | 3 refills | Status: DC

## 2020-07-08 MED ORDER — METHOCARBAMOL 500 MG PO TABS: 500.0000 mg | ORAL_TABLET | Freq: Three times a day (TID) | ORAL | Status: DC | PRN

## 2020-07-08 NOTE — Evaluation (Addendum)
Physical Therapy Evaluation Patient Details Name: Nicholas [name] MRN: [id number] DOB: 10-18-1930 Today's Date: 07/08/2020   History of Present Illness  Pt adm 6/11 with syncope and found to have Hgb 6.6. Anemia likely due to diverticular bleed.  PMH - ESRD on HD, HTN, chf, CAD, DM  Clinical Impression  Pt presents to PT with slightly unsteady gait due to illness and inactivity and painful knees. Expect pt will make good progress back to baseline with mobility. Will follow acutely but doubt pt will need PT after DC.      Follow Up Recommendations No PT follow up;Supervision - Intermittent    Equipment Recommendations  Rolling walker with 5" wheels    Recommendations for Other Services       Precautions / Restrictions Precautions Precautions: Fall      Mobility  Bed Mobility Overal bed mobility: Modified Independent                  Transfers Overall transfer level: Needs assistance Equipment used: Rolling walker (2 wheeled);4-wheeled walker Transfers: Sit to/from Stand Sit to Stand: Supervision         General transfer comment: supervision for safety and verbal cues for hand placement  Ambulation/Gait Ambulation/Gait assistance: Min guard;Supervision Gait Distance (Feet): 50 Feet (10' with rollator, 37' with rolling walker) Assistive device: Rolling walker (2 wheeled);4-wheeled walker Gait Pattern/deviations: Step-through pattern;Decreased step length - right;Decreased step length - left;Antalgic Gait velocity: decr Gait velocity interpretation: <1.31 ft/sec, indicative of household ambulator General Gait Details: Improved stability and confidence with rolling walker due to pt could unweight painful knees more so than with rollator  Stairs            Wheelchair Mobility    Modified Rankin (Stroke Patients Only)       Balance Overall balance assessment: Mild deficits observed, not formally tested                                            Pertinent Vitals/Pain Pain Assessment: Faces Faces Pain Scale: Hurts a little bit Pain Location: bil knees Pain Descriptors / Indicators: Grimacing;Guarding (stiff)    Home Living Family/patient expects to be discharged to:: Private residence Living Arrangements: Spouse/significant other;Children Available Help at Discharge: Family;Available 24 hours/day Type of Home: House Home Access: Stairs to enter   [employer] of Steps: 1 Home Layout: Two level;Bed/bath upstairs Home Equipment: Walker - 4 wheels;Cane - single point;Shower seat - built in;Wheelchair - manual Additional Comments: home O2    Prior Function Level of Independence: Independent with assistive device(s)         Comments: Uses cane. Takes w/c to/from HD     Hand Dominance        Extremity/Trunk Assessment   Upper Extremity Assessment Upper Extremity Assessment: Defer to OT evaluation    Lower Extremity Assessment Lower Extremity Assessment: Generalized weakness       Communication   Communication: No difficulties  Cognition Arousal/Alertness: Awake/alert Behavior During Therapy: WFL for tasks assessed/performed Overall Cognitive Status: Within Functional Limits for tasks assessed                                        General Comments      Exercises     Assessment/Plan    PT Assessment  Patient needs continued PT services  PT Problem List Decreased strength;Decreased activity tolerance;Decreased mobility;Pain       PT Treatment Interventions DME instruction;Gait training;Stair training;Functional mobility training;Therapeutic activities;Therapeutic exercise;Patient/family education    PT Goals (Current goals can be found in the Care Plan section)  Acute Rehab PT Goals Patient Stated Goal: return home PT Goal Formulation: With patient Time For Goal Achievement: 07/15/20 Potential to Achieve Goals: Good    Frequency Min 3X/week   Barriers to  discharge Inaccessible home environment stairs in the home    Co-evaluation               AM-PAC PT "6 Clicks" Mobility  Outcome Measure Help needed turning from your back to your side while in a flat bed without using bedrails?: None Help needed moving from lying on your back to sitting on the side of a flat bed without using bedrails?: None Help needed moving to and from a bed to a chair (including a wheelchair)?: A Little Help needed standing up from a chair using your arms (e.g., wheelchair or bedside chair)?: A Little Help needed to walk in hospital room?: A Little Help needed climbing 3-5 steps with a railing? : A Little 6 Click Score: 20    End of Session Equipment Utilized During Treatment: Gait belt;Oxygen Activity Tolerance: Patient limited by pain Patient left: in chair;with call bell/phone within reach;with chair alarm set Nurse Communication: Mobility status PT Visit Diagnosis: Other abnormalities of gait and mobility (R26.89);Pain Pain - Right/Left:  (bil) Pain - part of body: Knee    Time: 9417-4081 PT Time Calculation (min) (ACUTE ONLY): 25 min   Charges:   PT Evaluation $PT Eval Moderate Complexity: 1 Mod PT Treatments $Gait Training: 8-22 mins        [place] Pager [phone] Office [place] 07/08/2020, 10:38 AM

## 2020-07-08 NOTE — Progress Notes (Signed)
SATURATION QUALIFICATIONS: (This note is used to comply with regulatory documentation for home oxygen)  Patient Saturations on Room Air at Rest = 99%  Patient Saturations on Room Air while Ambulating = 87%  Patient Saturations on 2 Liters of oxygen while Ambulating = 98%  Please briefly explain why patient needs home oxygen: Patient requires supplemental oxygen to maintain oxygen saturations at acceptable, safe levels with physical activity.  Full OT note to follow.  Tyrone Schimke, OT Acute Rehabilitation Services Pager: [phone] Office: [phone]

## 2020-07-08 NOTE — Care Management Important Message (Signed)
Important Message  Patient Details  Name: Nicholas [name] MRN: [id number] Date of Birth: [birth year]   Medicare Important Message Given:  Yes     [name] [address] 07/08/2020, 1:58 PM

## 2020-07-08 NOTE — Evaluation (Signed)
Occupational Therapy Evaluation Patient Details Name: Nicholas [name] MRN: [id number] DOB: [birth year] Today's Date: 07/08/2020    History of Present Illness Pt adm 6/11 with syncope and found to have Hgb 6.6. Anemia likely due to diverticular bleed.  PMH - ESRD on HD, HTN, chf, CAD, DM   Clinical Impression   Pt admitted with the above diagnoses and presents with below problem list. Pt will benefit from continued acute OT to address the below listed deficits and maximize independence with basic ADLs prior to d/c home. At baseline pt is mod I with ADLs. Pt currently needs setup assist with UB ADLs, supervision-min guard with shower transfers and LB ADLs. Pt ambulated in the room on RA. While he did report feeling fine during this walk he did register a quick drop in O2 sats to upper 80s. Reapplied Diamond on 2L at end of session with sats quickly recovering into the 90s.     Follow Up Recommendations  No OT follow up    Equipment Recommendations  None recommended by OT    Recommendations for Other Services       Precautions / Restrictions Precautions Precautions: Fall Restrictions Weight Bearing Restrictions: No      Mobility Bed Mobility Overal bed mobility: Modified Independent                  Transfers Overall transfer level: Needs assistance Equipment used: Rolling walker (2 wheeled);4-wheeled walker Transfers: Sit to/from Stand Sit to Stand: Supervision         General transfer comment: supervision for safety and verbal cues for hand placement    Balance Overall balance assessment: Mild deficits observed, not formally tested                                         ADL either performed or assessed with clinical judgement   ADL Overall ADL's : Needs assistance/impaired Eating/Feeding: Set up;Sitting   Grooming: Set up;Sitting   Upper Body Bathing: Set up;Sitting   Lower Body Bathing: Supervison/ safety;Sit to/from stand;Min guard   Upper  Body Dressing : Set up;Sitting   Lower Body Dressing: Supervision/safety;Sit to/from stand;Min guard   Toilet Transfer: Supervision/safety;Ambulation;Comfort height toilet;RW   Toileting- Clothing Manipulation and Hygiene: Set up;Sitting/lateral lean;Sit to/from stand;Supervision/safety   Tub/ Shower Transfer: Supervision/safety;Min guard   Functional mobility during ADLs: Supervision/safety;Rolling walker General ADL Comments: Pt completed in room functional mobility on RA with no overt or reported distress. Sat, though, did drop to upper 80s. Also, discussed strategies for utilizing rw to off load some pressure/weight from painful knee while mobilizing.     Vision         Perception     Praxis      Pertinent Vitals/Pain Pain Assessment: Faces Faces Pain Scale: Hurts a little bit Pain Location: bil knees Pain Descriptors / Indicators: Grimacing;Guarding (stiff)     Hand Dominance     Extremity/Trunk Assessment Upper Extremity Assessment Upper Extremity Assessment: Generalized weakness   Lower Extremity Assessment Lower Extremity Assessment: Defer to PT evaluation       Communication Communication Communication: No difficulties   Cognition Arousal/Alertness: Awake/alert Behavior During Therapy: WFL for tasks assessed/performed Overall Cognitive Status: Within Functional Limits for tasks assessed  General Comments       Exercises     Shoulder Instructions      Home Living Family/patient expects to be discharged to:: Private residence Living Arrangements: Spouse/significant other;Children Available Help at Discharge: Family;Available 24 hours/day Type of Home: House Home Access: Stairs to enter [employer] of Steps: 1   Home Layout: Two level;Bed/bath upstairs     Bathroom Shower/Tub: Hospital doctor Toilet: Handicapped height     Home Equipment: Environmental consultant - 4 wheels;Cane - single  point;Shower seat - built in;Wheelchair - manual   Additional Comments: home O2      Prior Functioning/Environment Level of Independence: Independent with assistive device(s)        Comments: Uses cane. Takes w/c to/from HD        OT Problem List: Decreased strength;Decreased activity tolerance;Impaired balance (sitting and/or standing);Decreased knowledge of use of DME or AE;Decreased knowledge of precautions;Cardiopulmonary status limiting activity;Pain      OT Treatment/Interventions: Self-care/ADL training;Energy conservation;DME and/or AE instruction;Therapeutic activities;Patient/family education;Balance training    OT Goals(Current goals can be found in the care plan section) Acute Rehab OT Goals Patient Stated Goal: return home OT Goal Formulation: With patient Time For Goal Achievement: 07/22/20 Potential to Achieve Goals: Good ADL Goals Pt Will Perform Grooming: with modified independence;sitting;standing Pt Will Perform Lower Body Bathing: with modified independence;sit to/from stand Pt Will Perform Lower Body Dressing: with modified independence;sit to/from stand Pt Will Perform Tub/Shower Transfer: with modified independence;ambulating;shower seat;rolling walker;with supervision  OT Frequency: Min 2X/week   Barriers to D/C:            Co-evaluation              AM-PAC OT "6 Clicks" Daily Activity     Outcome Measure Help from another person eating meals?: None Help from another person taking care of personal grooming?: None Help from another person toileting, which includes using toliet, bedpan, or urinal?: None Help from another person bathing (including washing, rinsing, drying)?: A Little Help from another person to put on and taking off regular upper body clothing?: None Help from another person to put on and taking off regular lower body clothing?: A Little 6 Click Score: 22   End of Session Equipment Utilized During Treatment: Rolling  walker;Oxygen (on 2L at start and end of session) Nurse Communication: Other (comment) (O2 sats on RA and negative orthostatics)  Activity Tolerance: Patient tolerated treatment well;Other (comment) (Sat dropped to upper 80s on RA) Patient left: in chair;with call bell/phone within reach;with chair alarm set  OT Visit Diagnosis: Unsteadiness on feet (R26.81);Muscle weakness (generalized) (M62.81);Pain                Time: 1029-1056 OT Time Calculation (min): 27 min Charges:  OT General Charges $OT Visit: 1 Visit OT Evaluation $OT Eval Low Complexity: 1 Low OT Treatments $Self Care/Home Management : 8-22 mins  Tyrone Schimke, OT Acute Rehabilitation Services Pager: [phone] Office: [phone]   Hortencia Pilar 07/08/2020, 11:40 AM

## 2020-07-08 NOTE — Plan of Care (Signed)
  Problem: Education: Goal: Knowledge of General Education information will improve Description Including pain rating scale, medication(s)/side effects and non-pharmacologic comfort measures Outcome: Completed/Met   Problem: Health Behavior/Discharge Planning: Goal: Ability to manage health-related needs will improve Outcome: Completed/Met   Problem: Clinical Measurements: Goal: Ability to maintain clinical measurements within normal limits will improve Outcome: Completed/Met Goal: Will remain free from infection Outcome: Completed/Met Goal: Diagnostic test results will improve Outcome: Completed/Met Goal: Cardiovascular complication will be avoided Outcome: Completed/Met   Problem: Activity: Goal: Risk for activity intolerance will decrease Outcome: Completed/Met   Problem: Nutrition: Goal: Adequate nutrition will be maintained Outcome: Completed/Met   Problem: Coping: Goal: Level of anxiety will decrease Outcome: Completed/Met   Problem: Elimination: Goal: Will not experience complications related to bowel motility Outcome: Completed/Met Goal: Will not experience complications related to urinary retention Outcome: Completed/Met   Problem: Pain Managment: Goal: General experience of comfort will improve Outcome: Completed/Met   Problem: Safety: Goal: Ability to remain free from injury will improve Outcome: Completed/Met   Problem: Skin Integrity: Goal: Risk for impaired skin integrity will decrease Outcome: Completed/Met   

## 2020-07-08 NOTE — Discharge Summary (Signed)
Physician Discharge Summary  Nicholas [name] VEL:[id number] DOB: 1930/07/31 DOA: 07/04/2020  PCP: [facility]  Admit date: 07/04/2020 Discharge date: 07/08/2020  Admitted From: Home Disposition: Home  Recommendations for Outpatient Follow-up:  Follow ups as below. Please obtain CBC/BMP/Mag at follow up Please follow up on the following pending results: None  Home Health: None required Equipment/Devices: Rolling walker.  Patient has home oxygen  Discharge Condition: Stable CODE STATUS: Full code   Follow-up Information     [facility], [facility] Follow up in 1 week(s).   Contact information: [place] [address] [phone]                 Hospital Course: 87 year old M with PMH of ESRD on HD TTS, systolic CHF with recovered EF, DM-2, CAD s/p stents in 2020 add  chronic hypoxic RF on 2 L presenting with 2 episodes of bright red blood per rectum, and admitted for acute blood loss anemia with hemoglobin of 5.7 from a baseline of 7.8.  Plavix held.  Patient was transfused 2 units with appropriate response.  He underwent EGD and colonoscopy that did not reveal source of his bleeding other than that was felt to be the source of his bleeding.   Patient's hemoglobin remained stable after transfusion.  GI recommended holding Plavix for 1 week, and cleared patient for discharge.  Therapy recommended rolling walker.  See individual problem list below for more on hospital course.  Discharge Diagnoses:  Acute blood loss anemia due to rectal bleed-EGD basically normal.  Colonoscopy showed diverticulosis but no active bleed.  GI bleed felt to be diverticular.  H&H remained stable after 3 units. -Cleared for discharge by gastroenterology -Patient to hold Plavix for 1 week per GI recommendation -Recommend repeat CBC in 1 week  Syncope: Likely due to the above.  Orthostatic vitals negative.  Mild bilateral pleural effusion-likely due  to CHF in patient with ESRD. -Fluid management by dialysis  ESRD on HD TTS -Received HD on schedule while in-house.  Chronic hypoxic respiratory failure: Stable on home 2 L.  He may not need oxygen at rest.  CAD s/p PCI in 07/2020-on Plavix prior to admission. -Patient to hold Plavix for at least 1 week -Outpatient follow-up with his cardiologist.  Chronic combined CHF with recovered EF: Recent TTE with EF of 60 to 65% and G1 DD. -Fluid management by dialysis  Uncontrolled IDDM-2 with hyperglycemia: Recent A1c 7.9%. -Continue home medications.  Debility/physical deconditioning -Rolling walker.    Body mass index is 24.3 kg/m.            Discharge Exam: Vitals:   07/08/20 0949 07/08/20 1040  BP: (!) 93/51 (!) 92/54  Pulse: 87   Resp: 17   Temp: 98.4 F (36.9 C)   SpO2: 98%     GENERAL: No apparent distress.  Nontoxic. HEENT: MMM.  Vision and hearing grossly intact.  NECK: Supple.  No apparent JVD.  RESP:  No IWOB.  Fair aeration bilaterally. CVS:  RRR. Heart sounds normal.  ABD/GI/GU: Bowel sounds present. Soft. Non tender.  MSK/EXT:  Moves extremities. No apparent deformity. No edema.  SKIN: no apparent skin lesion or wound NEURO: Awake, alert and oriented appropriately.  No apparent focal neuro deficit. PSYCH: Calm. Normal affect.   Discharge Instructions  Discharge Instructions     Call MD for:   Complete by: As directed    Further rectal bleeding   Call MD for:  difficulty breathing, headache or visual disturbances  Complete by: As directed    Call MD for:  extreme fatigue   Complete by: As directed    Call MD for:  persistant dizziness or light-headedness   Complete by: As directed    Call MD for:  persistant nausea and vomiting   Complete by: As directed    Call MD for:  severe uncontrolled pain   Complete by: As directed    Diet - low sodium heart healthy   Complete by: As directed    Discharge instructions   Complete by: As directed     It has been a pleasure taking care of you!  You passed out likely due to anemia (low blood level) from rectal bleed.  Your blood level improved after blood transfusion.  Endoscopy and colonoscopy did not show source of bleeding but there is a concern that you might be bleeding from diverticulosis.  However, your bleeding symptoms subsided.  Gastroenterologist recommended resuming the Plavix after 1 week if no further bleeding.  We also recommend avoiding any over-the-counter pain medication other than plain Tylenol.  Please review your new medication list and the directions on your medications before you take them.  Recommend using MiraLAX to avoid constipation which could increase your risk of bleeding.  Follow-up with your primary care doctor in 1 to 2 weeks or sooner if needed.   Take care,   Increase activity slowly   Complete by: As directed       Allergies as of 07/08/2020       Reactions   Hydrochlorothiazide Other (See Comments)   [facility]        Medication List     STOP taking these medications    baclofen 10 MG tablet Commonly known as: LIORESAL   diphenhydrAMINE 25 MG tablet Commonly known as: SOMINEX       TAKE these medications    acetaminophen 500 MG tablet Commonly known as: TYLENOL Take 500 mg by mouth 3 (three) times daily as needed for moderate pain.   atorvastatin 80 MG tablet Commonly known as: LIPITOR Take 80 mg by mouth at bedtime.   clopidogrel 75 MG tablet Commonly known as: PLAVIX Take 1 tablet (75 mg total) by mouth daily with breakfast. Start taking on: July 14, 2020 What changed: These instructions start on July 14, 2020. If you are unsure what to do until then, ask your doctor or other care provider.   folic acid-vitamin b complex-vitamin c-selenium-zinc 3 MG Tabs tablet Take 1 tablet by mouth daily.   insulin NPH-regular Human (70-30) 100 UNIT/ML injection Inject 20 Units into the skin 2 (two) times daily with a  meal.   isosorbide dinitrate 20 MG tablet Commonly known as: ISORDIL Take 1 tablet (20 mg total) by mouth 2 (two) times daily.   lidocaine-prilocaine cream Commonly known as: EMLA Apply 1 application topically See admin instructions. Apply topically 30 minutes prior to dialysis on Tuesday, Thursday, Saturday   methocarbamol 500 MG tablet Commonly known as: ROBAXIN Take 1 tablet (500 mg total) by mouth every 8 (eight) hours as needed for muscle spasms. We recommend you take yourself off this medications slowly What changed:  when to take this reasons to take this additional instructions   multivitamin with minerals Tabs tablet Take 1 tablet by mouth daily.   NEPRO/CARBSTEADY PO Take 237 mLs by mouth daily. vanilla   nitroGLYCERIN 0.4 MG SL tablet Commonly known as: NITROSTAT Place 1 tablet (0.4 mg total) under the tongue every 5 (  five) minutes x 3 doses as needed for chest pain.   sevelamer carbonate 800 MG tablet Commonly known as: RENVELA Take 1,600 mg by mouth 3 (three) times daily.        Consultations: Nephrology Gastroenterology  Procedures/Studies: EGD normal. Colonoscopy with pandiverticulosis   DG CHEST PORT 1 VIEW  Result Date: 07/06/2020 CLINICAL DATA:  Right-sided pleural effusion EXAM: PORTABLE CHEST 1 VIEW COMPARISON:  06/08/2020 FINDINGS: Cardiac shadow is enlarged but stable. Loop recorder is again noted. Aortic calcifications are seen. Left lung is clear. Persistent right-sided effusion and atelectatic changes are noted stable from the prior exam. No new focal abnormality is seen. IMPRESSION: Stable right effusion with basilar atelectasis. Electronically Signed   By: Inez Catalina M.D.   On: 07/06/2020 10:51   ECHOCARDIOGRAM COMPLETE  Result Date: 07/05/2020    ECHOCARDIOGRAM REPORT   Patient Name:   Nicholas [name] Date of Exam: 07/05/2020 Medical Rec #:  [id number]    Height:       68.0 in Accession #:    [id number]   Weight:       155.4 lb Date of  Birth:  1930-10-26    BSA:          1.836 m Patient Age:    23 years     BP:           123/60 mmHg Patient Gender: M            HR:           79 bpm. Exam Location:  Inpatient Procedure: 2D Echo, Cardiac Doppler and Color Doppler Indications:    Shortness of breath  History:        Patient has prior history of Echocardiogram examinations, most                 recent 03/18/2019. CHF, CAD, COPD; Risk Factors:Former Smoker.                 ESRD, GI bleed, anemia.  Sonographer:    Dustin Flock Referring Phys: [id number] [place]  1. Left ventricular ejection fraction, by estimation, is 60 to 65%. The left ventricle has normal function. Left ventricular endocardial border not optimally defined to evaluate regional wall motion. There is mild left ventricular hypertrophy. Left ventricular diastolic parameters are consistent with Grade I diastolic dysfunction (impaired relaxation). Elevated left ventricular end-diastolic pressure.  2. Right ventricular systolic function is normal. The right ventricular size is normal. There is normal pulmonary artery systolic pressure. The estimated right ventricular systolic pressure is 85.6 mmHg.  3. Left atrial size was mildly dilated.  4. The mitral valve is normal in structure. No evidence of mitral valve regurgitation. No evidence of mitral stenosis.  5. The aortic valve has an indeterminant number of cusps. Aortic valve regurgitation is not visualized. Mild aortic valve sclerosis is present, with no evidence of aortic valve stenosis.  6. The inferior vena cava is normal in size with greater than 50% respiratory variability, suggesting right atrial pressure of 3 mmHg. FINDINGS  Left Ventricle: Left ventricular ejection fraction, by estimation, is 60 to 65%. The left ventricle has normal function. Left ventricular endocardial border not optimally defined to evaluate regional wall motion. The left ventricular internal cavity size was normal in size. There is mild left  ventricular hypertrophy. Left ventricular diastolic parameters are consistent with Grade I diastolic dysfunction (impaired relaxation). Elevated left ventricular end-diastolic pressure. Right Ventricle: The right ventricular size is normal. No increase in right  ventricular wall thickness. Right ventricular systolic function is normal. There is normal pulmonary artery systolic pressure. The tricuspid regurgitant velocity is 2.39 m/s, and  with an assumed right atrial pressure of 3 mmHg, the estimated right ventricular systolic pressure is 65.9 mmHg. Left Atrium: Left atrial size was mildly dilated. Right Atrium: Right atrial size was normal in size. Pericardium: There is no evidence of pericardial effusion. Mitral Valve: The mitral valve is normal in structure. There is mild calcification of the mitral valve leaflet(s). Mild mitral annular calcification. No evidence of mitral valve regurgitation. No evidence of mitral valve stenosis. Tricuspid Valve: The tricuspid valve is normal in structure. Tricuspid valve regurgitation is trivial. No evidence of tricuspid stenosis. Aortic Valve: The aortic valve has an indeterminant number of cusps. Aortic valve regurgitation is not visualized. Mild aortic valve sclerosis is present, with no evidence of aortic valve stenosis. Pulmonic Valve: The pulmonic valve was normal in structure. Pulmonic valve regurgitation is not visualized. No evidence of pulmonic stenosis. Aorta: The aortic root is normal in size and structure. Venous: The inferior vena cava is normal in size with greater than 50% respiratory variability, suggesting right atrial pressure of 3 mmHg. IAS/Shunts: No atrial level shunt detected by color flow Doppler.  LEFT VENTRICLE PLAX 2D LVIDd:         3.90 cm  Diastology LVIDs:         3.40 cm  LV e' medial:    3.37 cm/s LV PW:         1.20 cm  LV E/e' medial:  35.9 LV IVS:        1.20 cm  LV e' lateral:   5.00 cm/s LVOT diam:     2.20 cm  LV E/e' lateral: 24.2 LV SV:          91 LV SV Index:   49 LVOT Area:     3.80 cm  RIGHT VENTRICLE RV Basal diam:  2.40 cm RV S prime:     12.50 cm/s TAPSE (M-mode): 2.5 cm LEFT ATRIUM             Index       RIGHT ATRIUM           Index LA diam:        5.10 cm 2.78 cm/m  RA Area:     16.60 cm LA Vol (A2C):   55.2 ml 30.06 ml/m RA Volume:   42.40 ml  23.09 ml/m LA Vol (A4C):   70.6 ml 38.45 ml/m LA Biplane Vol: 64.8 ml 35.29 ml/m  AORTIC VALVE LVOT Vmax:   127.00 cm/s LVOT Vmean:  76.500 cm/s LVOT VTI:    0.239 m MITRAL VALVE                TRICUSPID VALVE MV Area (PHT): 3.66 cm     TR Peak grad:   22.8 mmHg MV Decel Time: 207 msec     TR Vmax:        239.00 cm/s MV E velocity: 121.00 cm/s MV A velocity: 165.00 cm/s  SHUNTS MV E/A ratio:  0.73         Systemic VTI:  0.24 m                             Systemic Diam: 2.20 cm Fransico Him MD Electronically signed by Fransico Him MD Signature Date/Time: 07/05/2020/1:18:10 PM    Final    CUP PACEART REMOTE  DEVICE CHECK  Result Date: 06/22/2020 ILR summary report received. Battery status OK. Normal device function. No new tachy, brady, or pause episodes. No new AF episodes. (8) new symptom events; available ECGs suggest SR w/ ectopy. Monthly summary reports and ROV/PRN. HB      The results of significant diagnostics from this hospitalization (including imaging, microbiology, ancillary and laboratory) are listed below for reference.     Microbiology: Recent Results (from the past 240 hour(s))  Resp Panel by RT-PCR (Flu A&B, Covid) Nasopharyngeal Swab     Status: None   Collection Time: 07/04/20 12:50 PM   Specimen: Nasopharyngeal Swab; Nasopharyngeal(NP) swabs in vial transport medium  Result Value Ref Range Status   SARS Coronavirus 2 by RT PCR NEGATIVE NEGATIVE Final    Comment: (NOTE) SARS-CoV-2 target nucleic acids are NOT DETECTED.  The SARS-CoV-2 RNA is generally detectable in upper respiratory specimens during the acute phase of infection. The lowest concentration of  SARS-CoV-2 viral copies this assay can detect is 138 copies/mL. A negative result does not preclude SARS-Cov-2 infection and should not be used as the sole basis for treatment or other patient management decisions. A negative result may occur with  improper specimen collection/handling, submission of specimen other than nasopharyngeal swab, presence of viral mutation(s) within the areas targeted by this assay, and inadequate number of viral copies(<138 copies/mL). A negative result must be combined with clinical observations, patient history, and epidemiological information. The expected result is Negative.  Fact Sheet for Patients:  EntrepreneurPulse.com.au  Fact Sheet for Healthcare Providers:  IncredibleEmployment.be  This test is no t yet approved or cleared by the [place] FDA and  has been authorized for detection and/or diagnosis of SARS-CoV-2 by FDA under an Emergency Use Authorization (EUA). This EUA will remain  in effect (meaning this test can be used) for the duration of the COVID-19 declaration under Section 564(b)(1) of the Act, 21 U.S.C.section 360bbb-3(b)(1), unless the authorization is terminated  or revoked sooner.       Influenza A by PCR NEGATIVE NEGATIVE Final   Influenza B by PCR NEGATIVE NEGATIVE Final    Comment: (NOTE) The Xpert Xpress SARS-CoV-2/FLU/RSV plus assay is intended as an aid in the diagnosis of influenza from Nasopharyngeal swab specimens and should not be used as a sole basis for treatment. Nasal washings and aspirates are unacceptable for Xpert Xpress SARS-CoV-2/FLU/RSV testing.  Fact Sheet for Patients: EntrepreneurPulse.com.au  Fact Sheet for Healthcare Providers: IncredibleEmployment.be  This test is not yet approved or cleared by the [place] FDA and has been authorized for detection and/or diagnosis of SARS-CoV-2 by FDA under an Emergency Use  Authorization (EUA). This EUA will remain in effect (meaning this test can be used) for the duration of the COVID-19 declaration under Section 564(b)(1) of the Act, 21 U.S.C. section 360bbb-3(b)(1), unless the authorization is terminated or revoked.  Performed at [facility], [place] [address], [place], [place] [address]      Labs:  CBC: Recent Labs  Lab 07/04/20 1000 07/05/20 0455 07/06/20 0236 07/06/20 1720 07/07/20 0634 07/07/20 1042 07/08/20 1255  WBC 11.4*  --  8.4 8.3 6.1 5.6  --   HGB 6.6*   < > 8.1* 7.7* 6.7* 8.7* 8.9*  HCT 21.7*   < > 25.2* 23.4* 20.0* 26.7* 27.6*  MCV 95.6  --  95.1 92.9 91.7 92.4  --   PLT 308  --  245 285 223 230  --    < > = values in  this interval not displayed.   BMP &GFR Recent Labs  Lab 07/04/20 1000 07/04/20 1825 07/06/20 1720 07/07/20 1150  NA 139  --   --  132*  K 4.8  --   --  3.5  CL 96*  --   --  94*  CO2 30  --   --  26  GLUCOSE 253*  --  52* 168*  BUN 35*  --   --  29*  CREATININE 7.06*  --   --  7.18*  CALCIUM 9.3  --   --  8.5*  MG  --  2.2  --   --   PHOS  --  4.6  --  4.5   Estimated Creatinine Clearance: 7 mL/min (A) (by C-G formula based on SCr of 7.18 mg/dL (H)). Liver & Pancreas: Recent Labs  Lab 07/04/20 1000 07/07/20 1150  AST 11*  --   ALT 12  --   ALKPHOS 61  --   BILITOT 0.5  --   PROT 6.4*  --   ALBUMIN 2.7* 2.4*   Recent Labs  Lab 07/04/20 1205  LIPASE 39   No results for input(s): AMMONIA in the last 168 hours. Diabetic: No results for input(s): HGBA1C in the last 72 hours. Recent Labs  Lab 07/07/20 1307 07/07/20 1721 07/07/20 2059 07/08/20 0642 07/08/20 1142  GLUCAP 104* 199* 144* 88 191*   Cardiac Enzymes: No results for input(s): CKTOTAL, CKMB, CKMBINDEX, TROPONINI in the last 168 hours. No results for input(s): PROBNP in the last 8760 hours. Coagulation Profile: No results for input(s): INR, PROTIME in the last 168 hours. Thyroid Function Tests: No results for  input(s): TSH, T4TOTAL, FREET4, T3FREE, THYROIDAB in the last 72 hours. Lipid Profile: No results for input(s): CHOL, HDL, LDLCALC, TRIG, CHOLHDL, LDLDIRECT in the last 72 hours. Anemia Panel: No results for input(s): VITAMINB12, FOLATE, FERRITIN, TIBC, IRON, RETICCTPCT in the last 72 hours. Urine analysis: No results found for: COLORURINE, APPEARANCEUR, LABSPEC, PHURINE, GLUCOSEU, HGBUR, BILIRUBINUR, KETONESUR, PROTEINUR, UROBILINOGEN, NITRITE, LEUKOCYTESUR Sepsis Labs: Invalid input(s): PROCALCITONIN, LACTICIDVEN   Time coordinating discharge: 40 minutes  SIGNED:  Mercy Riding, MD  [facility] 07/08/2020, 10:09 PM  If 7PM-7AM, please contact night-coverage [email or web address]

## 2020-07-08 NOTE — Progress Notes (Signed)
DISCHARGE NOTE HOME Forrest Jaroszewski to be discharged Home per MD order. Discussed prescriptions and follow up appointments with the patient. Prescriptions given to patient; medication list explained in detail. Patient verbalized understanding.  Skin clean, dry and intact without evidence of skin break down, no evidence of skin tears noted. IV catheter discontinued intact. Site without signs and symptoms of complications. Dressing and pressure applied. Pt denies pain at the site currently. No complaints noted.  Patient free of lines, drains, and wounds.   An After Visit Summary (AVS) was printed and given to the patient. Patient escorted via wheelchair, and discharged home via private auto.  Vira Agar, RN

## 2020-07-08 NOTE — Progress Notes (Signed)
[facility] Progress Note   Subjective: Seen in room, no C/Os.     Objective Vitals:   07/07/20 1150 07/07/20 1208 07/07/20 2100 07/08/20 0523  BP: 132/62 132/63 (!) 129/58 (!) 97/53  Pulse: 83  85 80  Resp: 17 19 17 17   Temp:  97.7 F (36.5 C) 98.8 F (37.1 C) 98.4 F (36.9 C)  TempSrc:  Oral Oral   SpO2:  99% 100% 100%  Weight:      Height:       Physical Exam General: Pleasant elderly male in NAD Heart: S1,S2 RRR No M/R/G Lungs: CTAB Abdomen: S, NT Extremities: No LE edema Dialysis Access: L AVF +T/B                  Additional Objective Labs: Basic Metabolic Panel: Recent Labs  Lab 07/04/20 1000 07/04/20 1825 07/06/20 1720 07/07/20 1150  NA 139  --   --  132*  K 4.8  --   --  3.5  CL 96*  --   --  94*  CO2 30  --   --  26  GLUCOSE 253*  --  52* 168*  BUN 35*  --   --  29*  CREATININE 7.06*  --   --  7.18*  CALCIUM 9.3  --   --  8.5*  PHOS  --  4.6  --  4.5   Liver Function Tests: Recent Labs  Lab 07/04/20 1000 07/07/20 1150  AST 11*  --   ALT 12  --   ALKPHOS 61  --   BILITOT 0.5  --   PROT 6.4*  --   ALBUMIN 2.7* 2.4*   Recent Labs  Lab 07/04/20 1205  LIPASE 39   CBC: Recent Labs  Lab 07/04/20 1000 07/05/20 0455 07/06/20 0236 07/06/20 1720 07/07/20 0634 07/07/20 1042  WBC 11.4*  --  8.4 8.3 6.1 5.6  HGB 6.6*   < > 8.1* 7.7* 6.7* 8.7*  HCT 21.7*   < > 25.2* 23.4* 20.0* 26.7*  MCV 95.6  --  95.1 92.9 91.7 92.4  PLT 308  --  245 285 223 230   < > = values in this interval not displayed.   Blood Culture    Component Value Date/Time   SDES PLEURAL FLUID 06/08/2020 0839   SPECREQUEST LUNG RIGHT 06/08/2020 0839   CULT  06/08/2020 0839    NO GROWTH 3 DAYS Performed at [facility], [place] [address], [place], [place] [address]    REPTSTATUS 06/11/2020 FINAL 06/08/2020 0839    Cardiac Enzymes: No results for input(s): CKTOTAL, CKMB, CKMBINDEX, TROPONINI in the last 168 hours. CBG: Recent Labs  Lab  07/07/20 0641 07/07/20 1307 07/07/20 1721 07/07/20 2059 07/08/20 0642  GLUCAP 195* 104* 199* 144* 88   Iron Studies: No results for input(s): IRON, TIBC, TRANSFERRIN, FERRITIN in the last 72 hours. @lablastinr3 @ Studies/Results: DG CHEST PORT 1 VIEW  Result Date: 07/06/2020 CLINICAL DATA:  Right-sided pleural effusion EXAM: PORTABLE CHEST 1 VIEW COMPARISON:  06/08/2020 FINDINGS: Cardiac shadow is enlarged but stable. Loop recorder is again noted. Aortic calcifications are seen. Left lung is clear. Persistent right-sided effusion and atelectatic changes are noted stable from the prior exam. No new focal abnormality is seen. IMPRESSION: Stable right effusion with basilar atelectasis. Electronically Signed   By: Inez Catalina M.D.   On: 07/06/2020 10:51   Medications:  sodium chloride      sodium chloride   Intravenous Once  sodium chloride   Intravenous Once   sodium chloride   Intravenous Once   atorvastatin  80 mg Oral QHS   Chlorhexidine Gluconate Cloth  6 each Topical Q0600   feeding supplement (NEPRO CARB STEADY)  237 mL Oral Daily   insulin aspart  0-5 Units Subcutaneous QHS   insulin aspart  0-6 Units Subcutaneous TID WC   insulin aspart protamine- aspart  14 Units Subcutaneous BID WC   isosorbide dinitrate  20 mg Oral BID   lidocaine-prilocaine  1 application Topical Q T,Th,Sa-HD   methocarbamol  500 mg Oral TID   multivitamin  1 tablet Oral Daily   sevelamer carbonate  1,600 mg Oral TID WC   sodium chloride flush  3 mL Intravenous Q12H     OP [facility]: [facility], dry wt 73kg in May       Assessment/ Plan: GI bleed - presented w/ rectal bleeding and syncopal episode. Admit Hb 6.6.  No further rectal bleeding here per pt. Went for endo 07/06/20 no source of bleeding found. Per GI, possible diverticular bleed which stopped spontaneously. Now S/P 2 units PRBCs, Last HGB 8.7.  ESRD - on HD TTS. Next HD 06/16 hopefully at OP center.  BP/ volume - BP's low-normal here,  not on any BP lowering meds at home. Min UF on HD overnight.  No vol^ on exam. CAD - extensive hx of CAD, had PCI 07/2018. Had [place] here during admit May 2022, no new intervention, medical Rx. MBD ckd - cont binder, PO4 at goal.  Anemia ckd - See above.     Nicholas [name] H. Angellynn Kimberlin NP-C 07/08/2020, 8:45 AM  [employer] [phone]

## 2020-07-14 ENCOUNTER — Ambulatory Visit: Payer: Medicare Other | Admitting: Physician Assistant

## 2020-07-15 NOTE — Progress Notes (Signed)
[facility] Summary Report / Loop Recorder 

## 2020-07-24 ENCOUNTER — Ambulatory Visit (INDEPENDENT_AMBULATORY_CARE_PROVIDER_SITE_OTHER): Payer: Medicare Other

## 2020-07-24 DIAGNOSIS — R55 Syncope and collapse: Secondary | ICD-10-CM

## 2020-07-27 LAB — CUP PACEART REMOTE DEVICE CHECK
Date Time Interrogation Session: 20220629230422
Implantable Pulse Generator Implant Date: 20210412

## 2020-07-29 DIAGNOSIS — L84 Corns and callosities: Secondary | ICD-10-CM | POA: Diagnosis not present

## 2020-07-29 DIAGNOSIS — B351 Tinea unguium: Secondary | ICD-10-CM | POA: Diagnosis not present

## 2020-07-29 DIAGNOSIS — E1159 Type 2 diabetes mellitus with other circulatory complications: Secondary | ICD-10-CM | POA: Diagnosis not present

## 2020-08-10 DIAGNOSIS — R1319 Other dysphagia: Secondary | ICD-10-CM | POA: Diagnosis not present

## 2020-08-10 DIAGNOSIS — R053 Chronic cough: Secondary | ICD-10-CM | POA: Diagnosis not present

## 2020-08-10 DIAGNOSIS — J984 Other disorders of lung: Secondary | ICD-10-CM | POA: Diagnosis not present

## 2020-08-10 DIAGNOSIS — J90 Pleural effusion, not elsewhere classified: Secondary | ICD-10-CM | POA: Diagnosis not present

## 2020-08-10 DIAGNOSIS — R0902 Hypoxemia: Secondary | ICD-10-CM | POA: Diagnosis not present

## 2020-08-10 DIAGNOSIS — Z87891 Personal history of nicotine dependence: Secondary | ICD-10-CM | POA: Diagnosis not present

## 2020-08-13 NOTE — Progress Notes (Signed)
[facility] Summary Report / Loop Recorder 

## 2020-08-14 DIAGNOSIS — J439 Emphysema, unspecified: Secondary | ICD-10-CM | POA: Diagnosis not present

## 2020-08-14 DIAGNOSIS — R911 Solitary pulmonary nodule: Secondary | ICD-10-CM | POA: Diagnosis not present

## 2020-08-14 DIAGNOSIS — J9811 Atelectasis: Secondary | ICD-10-CM | POA: Diagnosis not present

## 2020-08-14 DIAGNOSIS — J90 Pleural effusion, not elsewhere classified: Secondary | ICD-10-CM | POA: Diagnosis not present

## 2020-08-14 DIAGNOSIS — I712 Thoracic aortic aneurysm, without rupture: Secondary | ICD-10-CM | POA: Diagnosis not present

## 2020-08-25 ENCOUNTER — Encounter: Payer: Self-pay | Admitting: Cardiovascular Disease

## 2020-08-25 LAB — CUP PACEART REMOTE DEVICE CHECK
Date Time Interrogation Session: 20220801230220
Implantable Pulse Generator Implant Date: 20210412

## 2020-08-25 NOTE — Progress Notes (Signed)
This encounter was created in error - please disregard.

## 2020-08-26 ENCOUNTER — Telehealth: Payer: Self-pay | Admitting: Nurse Practitioner

## 2020-08-26 ENCOUNTER — Encounter: Payer: Medicare Other | Admitting: Cardiovascular Disease

## 2020-08-26 ENCOUNTER — Ambulatory Visit (INDEPENDENT_AMBULATORY_CARE_PROVIDER_SITE_OTHER): Payer: Medicare Other

## 2020-08-26 DIAGNOSIS — R55 Syncope and collapse: Secondary | ICD-10-CM | POA: Diagnosis not present

## 2020-08-26 NOTE — Telephone Encounter (Signed)
Called patient regarding an appointment he has today with Dr. Acie Fredrickson. He is Dr. Thompson Caul patient and has an appointment to see Melina Copa, PA, one of the APPs on Dr. Thompson Caul team, on Friday August 12. Patient states he is not having any acute issues and denies chest pain, SOB, syncope, or other concerns. He states he was wondering why he was not scheduled to see Dr. Tamala Julian. It appears the appointment was made in error during a previous hospitalization in May. He is aware that we will cancel today's appointment with Dr. Acie Fredrickson and I apologized for the error. He states he has a conflict with the appointment on 8/12 with Melina Copa, so I have cancelled that. He requests to see Dr. Tamala Julian in August due to that was Dr. Thompson Caul instructions to him when he last saw him December 2021. I explained team based care and that he may see one of the APPs on Dr. Thompson Caul team and advised that I will forward the message to Maude Leriche, RN, Dr. Thompson Caul nurse so that she can check for cancellations with Dr. Tamala Julian. I advised him to call back sooner if he has any questions or concerns. Patient verbalized understanding and agreement and thanked me for the call.

## 2020-08-31 NOTE — Telephone Encounter (Signed)
Scheduled pt to see Dr. Tamala Julian 8/10.  Pt appreciative for assistance.

## 2020-08-31 NOTE — Progress Notes (Signed)
Cardiology Office Note:    Date:  09/02/2020   ID:  Nicholas [name], DOB [birth year], MRN [id number]  PCP:  [facility]  Cardiologist:  Sinclair Grooms, MD   Referring MD: [facility]   Chief Complaint  Patient presents with   Coronary Artery Disease   Congestive Heart Failure   Follow-up    End-stage kidney disease    History of Present Illness:    Nicholas [name] is a 87 y.o. male with a hx of CAD, chronic systolic CHF,  ESRD on HD TTS, IDT2DM, HTN, HLD, anemia of chronic disease requiring transfusion .  Non-ST elevation MI June 2020 ultimately treated LAD with orbital atherectomy and stenting (Dr. Saundra Shelling). Since event has has recurrent syncope and now has loop recorder.  Increasing isosorbide has decreased the need for sublingual nitroglycerin.  He feels better now than the last time I saw him.  If he walks too far or too fast to get short of breath.  He has COPD and a recent CT scan demonstrated moderate to large right lung pleural effusion with atelectasis noted.  Past Medical History:  Diagnosis Date   Arthritis    Chronic kidney disease    Diabetes mellitus ([place])    Dialysis patient ([place])    GERD (gastroesophageal reflux disease)    occ   Heart murmur    Hypercholesteremia    Hypertension    Psoriasis     Past Surgical History:  Procedure Laterality Date   CARDIAC CATHETERIZATION  09   "some blockage" with collaterals by cath at [facility] ~ 2009; no intervention required; reportedly, no routine cardiology f/u recommended    COLONOSCOPY WITH PROPOFOL N/A 07/06/2020   Procedure: COLONOSCOPY WITH PROPOFOL;  Surgeon: Ronald Lobo, MD;  Location: [place];  Service: Endoscopy;  Laterality: N/A;   CORONARY ATHERECTOMY N/A 08/09/2018   Procedure: CORONARY ATHERECTOMY;  Surgeon: Jettie Booze, MD;  Location: [place] [facility];  Service: Cardiovascular;  Laterality: N/A;   CORONARY STENT INTERVENTION N/A 08/09/2018   Procedure: CORONARY STENT  INTERVENTION;  Surgeon: Jettie Booze, MD;  Location: [place] [facility];  Service: Cardiovascular;  Laterality: N/A;   ESOPHAGOGASTRODUODENOSCOPY (EGD) WITH PROPOFOL N/A 07/06/2020   Procedure: ESOPHAGOGASTRODUODENOSCOPY (EGD) WITH PROPOFOL;  Surgeon: Ronald Lobo, MD;  Location: [place];  Service: Endoscopy;  Laterality: N/A;   INGUINAL HERNIA REPAIR Right 04/13/2012   Procedure: HERNIA REPAIR INGUINAL INCARCERATED;  Surgeon: Gayland Curry, MD;  Location: [place];  Service: General;  Laterality: Right;   INSERTION OF MESH Right 04/13/2012   Procedure: INSERTION OF MESH;  Surgeon: Gayland Curry, MD;  Location: [place];  Service: General;  Laterality: Right;   IR THORACENTESIS ASP PLEURAL SPACE W/IMG GUIDE  06/08/2020   LEFT HEART CATH N/A 08/09/2018   Procedure: Left Heart Cath;  Surgeon: Jettie Booze, MD;  Location: [place] [facility];  Service: Cardiovascular;  Laterality: N/A;   LEFT HEART CATH AND CORONARY ANGIOGRAPHY N/A 08/08/2018   Procedure: LEFT HEART CATH AND CORONARY ANGIOGRAPHY;  Surgeon: Troy Sine, MD;  Location: [place] [facility];  Service: Cardiovascular;  Laterality: N/A;   LEFT HEART CATH AND CORONARY ANGIOGRAPHY N/A 06/05/2020   Procedure: LEFT HEART CATH AND CORONARY ANGIOGRAPHY;  Surgeon: Belva Crome, MD;  Location: [place] [facility];  Service: Cardiovascular;  Laterality: N/A;   VASECTOMY      Current Medications: Current Meds  Medication Sig   acetaminophen (TYLENOL) 500 MG tablet  Take 500 mg by mouth 3 (three) times daily as needed for moderate pain.   atorvastatin (LIPITOR) 80 MG tablet Take 80 mg by mouth at bedtime.   cinacalcet (SENSIPAR) 30 MG tablet Take 30 mg by mouth in the morning, at noon, and at bedtime.   clopidogrel (PLAVIX) 75 MG tablet Take 1 tablet (75 mg total) by mouth daily with breakfast.   folic acid-vitamin b complex-vitamin c-selenium-zinc (DIALYVITE) 3 MG TABS tablet Take 1 tablet by mouth daily.   insulin NPH-regular  Human (70-30) 100 UNIT/ML injection Inject 20 Units into the skin 2 (two) times daily with a meal.   isosorbide dinitrate (ISORDIL) 20 MG tablet Take 1 tablet (20 mg total) by mouth 2 (two) times daily.   lidocaine-prilocaine (EMLA) cream Apply 1 application topically See admin instructions. Apply topically 30 minutes prior to dialysis on Tuesday, Thursday, Saturday   methocarbamol (ROBAXIN) 500 MG tablet Take 1 tablet (500 mg total) by mouth every 8 (eight) hours as needed for muscle spasms. We recommend you take yourself off this medications slowly   Multiple Vitamin (MULTIVITAMIN WITH MINERALS) TABS tablet Take 1 tablet by mouth daily.   nitroGLYCERIN (NITROSTAT) 0.4 MG SL tablet Place 1 tablet (0.4 mg total) under the tongue every 5 (five) minutes x 3 doses as needed for chest pain.   Nutritional Supplements (NEPRO/CARBSTEADY PO) Take 237 mLs by mouth daily. vanilla   sevelamer carbonate (RENVELA) 800 MG tablet Take 1,600 mg by mouth 3 (three) times daily.     Allergies:   Hydrochlorothiazide   Social History   Socioeconomic History   Marital status: Married    Spouse name: Not on file   Number of children: Not on file   Years of education: Not on file   Highest education level: Not on file  Occupational History   Not on file  Tobacco Use   Smoking status: Former    Packs/day: 1.00    Years: 15.00    Pack years: 15.00    Types: Cigarettes    Quit date: 03/29/1987    Years since quitting: 33.4   Smokeless tobacco: Never  Substance and Sexual Activity   Alcohol use: No   Drug use: No   Sexual activity: Not on file  Other Topics Concern   Not on file  Social History Narrative   Not on file   Social Determinants of Health   Financial Resource Strain: Not on file  Food Insecurity: Not on file  Transportation Needs: Not on file  Physical Activity: Not on file  Stress: Not on file  Social Connections: Not on file     Family History: The patient's family history includes  Diabetes in his brother, mother, and sister; Stroke in his father.  ROS:   Please see the history of present illness.    Appetite is good.  Nitroglycerin use is significantly decreased since isosorbide was increased to twice daily.  All other systems reviewed and are negative.  EKGs/Labs/Other Studies Reviewed:    The following studies were reviewed today: Chest CT 07/2020: IMPRESSION: 1. Complex moderate right pleural effusion is probably exudative given the heterogeneity of density. Increased masslike density in the right lower lobe is probably rounded atelectasis but an underlying lesion or malignancy cannot be readily excluded. Nuclear medicine PET-CT can sometimes be helpful in differentiating rounded atelectasis from other lesions. 2. Several small subpleural nodules in the 3 mm diameter range are not appreciably changed from earliest available comparison of 12/23/2019. Likely benign although  we have not follow these up for 12 months. 3. Ascending thoracic aortic aneurysm 4.1 cm in diameter. Recommend annual imaging followup by CTA or MRA. This recommendation follows 2010 ACCF/AHA/AATS/ACR/ASA/SCA/SCAI/SIR/STS/SVM Guidelines for the Diagnosis and Management of Patients with Thoracic Aortic Disease. Circulation. 2010; 121: P619-J093. Aortic aneurysm NOS (ICD10-I71.9) 4. Other imaging findings of potential clinical significance: Aortic Atherosclerosis (ICD10-I70.0). Coronary atherosclerosis. Mild cardiomegaly. Low-density blood pool suggests anemia. Probable complex cyst of the left kidney upper pole. Airway plugging in the right middle lobe. Right C7 cervical rib. Bilateral gynecomastia. Emphysema (ICD10-J43.9).  EKG:  EKG not repeated  Recent Labs: 06/04/2020: B Natriuretic Peptide 268.2 07/04/2020: ALT 12; Magnesium 2.2 07/07/2020: BUN 29; Creatinine, Ser 7.18; Platelets 230; Potassium 3.5; Sodium 132 07/08/2020: Hemoglobin 8.9  Recent Lipid Panel    Component Value  Date/Time   CHOL 198 06/05/2020 0740   TRIG 71 06/05/2020 0740   HDL 62 06/05/2020 0740   CHOLHDL 3.2 06/05/2020 0740   VLDL 14 06/05/2020 0740   LDLCALC 122 (H) 06/05/2020 0740    Physical Exam:    VS:  BP 98/62   Pulse 63   Ht 5\' 7"  (1.702 m)   Wt 155 lb 6.4 oz (70.5 kg)   SpO2 99%   BMI 24.34 kg/m     Wt Readings from Last 3 Encounters:  09/02/20 155 lb 6.4 oz (70.5 kg)  07/07/20 159 lb 13.3 oz (72.5 kg)  06/06/20 154 lb 15.7 oz (70.3 kg)     GEN: Chronically ill-appearing, in wheelchair.. No acute distress HEENT: Normal NECK: No JVD. LYMPHATICS: No lymphadenopathy CARDIAC: No murmur. RRR S4 gallop, or edema. VASCULAR:  Normal Pulses. No bruits. RESPIRATORY: Diminished breath sounds 1/3-1/2 the way up on the right.  Lungs otherwise clear to auscultation without rales, wheezing or rhonchi  ABDOMEN: Soft, non-tender, non-distended, No pulsatile mass, MUSCULOSKELETAL: No deformity  SKIN: Warm and dry NEUROLOGIC:  Alert and oriented x 3 PSYCHIATRIC:  Normal affect   ASSESSMENT:    1. Chronic systolic CHF (congestive heart failure) ([place])   2. Syncope and collapse   3. Coronary artery disease involving native coronary artery of native heart with unstable angina pectoris ([place])   4. ESRD on dialysis ([place])   5. Diabetes mellitus due to underlying condition with hyperosmolarity without coma, with long-term current use of insulin (H[facility])   6. Hyperlipidemia LDL goal <70   7. Recurrent right pleural effusion    PLAN:    In order of problems listed above:  Volume management per dialysis.  Low blood pressures prevent guideline directed heart failure therapy. No recurrent episodes of syncope although he does have low blood pressure. Stable angina on isosorbide.  Continue high intensity statin therapy as preventive measure along with Plavix. Continue dialysis as tolerated Followed by primary care Lipitor 80 mg/day.  Target LDL less than 70. Agree with upcoming thoracentesis  to be performed at [facility].  Overall education and awareness concerning primary/secondary risk prevention was discussed in detail: LDL less than 70, hemoglobin A1c less than 7, blood pressure target less than 130/80 mmHg, >150 minutes of moderate aerobic activity per week, avoidance of smoking, weight control (via diet and exercise), and continued surveillance/management of/for obstructive sleep apnea.    Medication Adjustments/Labs and Tests Ordered: Current medicines are reviewed at length with the patient today.  Concerns regarding medicines are outlined above.  No orders of the defined types were placed in this encounter.  No orders of the defined types were  placed in this encounter.   Patient Instructions  Medication Instructions:  Your physician recommends that you continue on your current medications as directed. Please refer to the Current Medication list given to you today.  *If you need a refill on your cardiac medications before your next appointment, please call your pharmacy*   Lab Work: None If you have labs (blood work) drawn today and your tests are completely normal, you will receive your results only by: [place] (if you have MyChart) OR A paper copy in the mail If you have any lab test that is abnormal or we need to change your treatment, we will call you to review the results.   Testing/Procedures: None   Follow-Up: At [facility], you and your health needs are our priority.  As part of our continuing mission to provide you with exceptional heart care, we have created designated Provider Care Teams.  These Care Teams include your primary Cardiologist (physician) and Advanced Practice Providers (APPs -  Physician Assistants and Nurse Practitioners) who all work together to provide you with the care you need, when you need it.  We recommend signing up for the patient portal called "MyChart".  Sign up information is  provided on this After Visit Summary.  MyChart is used to connect with patients for Virtual Visits (Telemedicine).  Patients are able to view lab/test results, encounter notes, upcoming appointments, etc.  Non-urgent messages can be sent to your provider as well.   To learn more about what you can do with MyChart, go to [email or web address].    Your next appointment:   9-12 month(s)  The format for your next appointment:   In Person  Provider:   You may see Sinclair Grooms, MD or one of the following Advanced Practice Providers on your designated Care Team:   Cecilie Kicks, NP   Other Instructions     Signed, Sinclair Grooms, MD  09/02/2020 9:16 AM    [place]

## 2020-09-02 ENCOUNTER — Other Ambulatory Visit: Payer: Self-pay

## 2020-09-02 ENCOUNTER — Encounter: Payer: Self-pay | Admitting: Interventional Cardiology

## 2020-09-02 ENCOUNTER — Ambulatory Visit (INDEPENDENT_AMBULATORY_CARE_PROVIDER_SITE_OTHER): Payer: Medicare Other | Admitting: Interventional Cardiology

## 2020-09-02 VITALS — BP 98/62 | HR 63 | Ht 67.0 in | Wt 155.4 lb

## 2020-09-02 DIAGNOSIS — J90 Pleural effusion, not elsewhere classified: Secondary | ICD-10-CM

## 2020-09-02 DIAGNOSIS — I5022 Chronic systolic (congestive) heart failure: Secondary | ICD-10-CM

## 2020-09-02 DIAGNOSIS — Z794 Long term (current) use of insulin: Secondary | ICD-10-CM

## 2020-09-02 DIAGNOSIS — R55 Syncope and collapse: Secondary | ICD-10-CM

## 2020-09-02 DIAGNOSIS — N186 End stage renal disease: Secondary | ICD-10-CM | POA: Diagnosis not present

## 2020-09-02 DIAGNOSIS — I25110 Atherosclerotic heart disease of native coronary artery with unstable angina pectoris: Secondary | ICD-10-CM | POA: Diagnosis not present

## 2020-09-02 DIAGNOSIS — E0800 Diabetes mellitus due to underlying condition with hyperosmolarity without nonketotic hyperglycemic-hyperosmolar coma (NKHHC): Secondary | ICD-10-CM

## 2020-09-02 DIAGNOSIS — Z992 Dependence on renal dialysis: Secondary | ICD-10-CM

## 2020-09-02 DIAGNOSIS — E785 Hyperlipidemia, unspecified: Secondary | ICD-10-CM

## 2020-09-02 NOTE — Patient Instructions (Signed)
Medication Instructions:  Your physician recommends that you continue on your current medications as directed. Please refer to the Current Medication list given to you today.  *If you need a refill on your cardiac medications before your next appointment, please call your pharmacy*   Lab Work: None If you have labs (blood work) drawn today and your tests are completely normal, you will receive your results only by: [place] (if you have MyChart) OR A paper copy in the mail If you have any lab test that is abnormal or we need to change your treatment, we will call you to review the results.   Testing/Procedures: None   Follow-Up: At [facility], you and your health needs are our priority.  As part of our continuing mission to provide you with exceptional heart care, we have created designated Provider Care Teams.  These Care Teams include your primary Cardiologist (physician) and Advanced Practice Providers (APPs -  Physician Assistants and Nurse Practitioners) who all work together to provide you with the care you need, when you need it.  We recommend signing up for the patient portal called "MyChart".  Sign up information is provided on this After Visit Summary.  MyChart is used to connect with patients for Virtual Visits (Telemedicine).  Patients are able to view lab/test results, encounter notes, upcoming appointments, etc.  Non-urgent messages can be sent to your provider as well.   To learn more about what you can do with MyChart, go to [email or web address].    Your next appointment:   9-12 month(s)  The format for your next appointment:   In Person  Provider:   You may see Sinclair Grooms, MD or one of the following Advanced Practice Providers on your designated Care Team:   Cecilie Kicks, NP   Other Instructions

## 2020-09-04 ENCOUNTER — Ambulatory Visit: Payer: Medicare Other | Admitting: Physician Assistant

## 2020-09-07 ENCOUNTER — Telehealth: Payer: Self-pay

## 2020-09-07 DIAGNOSIS — D7389 Other diseases of spleen: Secondary | ICD-10-CM | POA: Diagnosis not present

## 2020-09-07 DIAGNOSIS — R7301 Impaired fasting glucose: Secondary | ICD-10-CM | POA: Diagnosis not present

## 2020-09-07 DIAGNOSIS — R918 Other nonspecific abnormal finding of lung field: Secondary | ICD-10-CM | POA: Diagnosis not present

## 2020-09-07 DIAGNOSIS — J90 Pleural effusion, not elsewhere classified: Secondary | ICD-10-CM | POA: Diagnosis not present

## 2020-09-07 DIAGNOSIS — I2510 Atherosclerotic heart disease of native coronary artery without angina pectoris: Secondary | ICD-10-CM | POA: Diagnosis not present

## 2020-09-07 DIAGNOSIS — J432 Centrilobular emphysema: Secondary | ICD-10-CM | POA: Diagnosis not present

## 2020-09-07 DIAGNOSIS — I700 Atherosclerosis of aorta: Secondary | ICD-10-CM | POA: Diagnosis not present

## 2020-09-07 DIAGNOSIS — K753 Granulomatous hepatitis, not elsewhere classified: Secondary | ICD-10-CM | POA: Diagnosis not present

## 2020-09-07 DIAGNOSIS — J439 Emphysema, unspecified: Secondary | ICD-10-CM | POA: Diagnosis not present

## 2020-09-07 NOTE — Telephone Encounter (Signed)
ILR alert received for 1 tachy event on 09/04/20 @ 16:06, max V rate 158 bpm, duration 7 seconds. No BB on file. ILR was implanted for syncope. Attempted to contact patient to assess for symptoms. No answer, LMTCB.

## 2020-09-08 NOTE — Telephone Encounter (Signed)
Spoke with pt.  He does not recall what he was doing at time of episode but also not that he does not recall having any cardiac symptoms.    Advised I would forward info to Dr. Caryl Comes, anticipate continued monitoring.

## 2020-09-09 ENCOUNTER — Other Ambulatory Visit: Payer: Self-pay

## 2020-09-09 ENCOUNTER — Encounter (HOSPITAL_BASED_OUTPATIENT_CLINIC_OR_DEPARTMENT_OTHER): Payer: Self-pay

## 2020-09-09 ENCOUNTER — Emergency Department (HOSPITAL_BASED_OUTPATIENT_CLINIC_OR_DEPARTMENT_OTHER): Payer: Medicare Other

## 2020-09-09 ENCOUNTER — Inpatient Hospital Stay (HOSPITAL_BASED_OUTPATIENT_CLINIC_OR_DEPARTMENT_OTHER)
Admission: EM | Admit: 2020-09-09 | Discharge: 2020-09-13 | DRG: 377 | Disposition: A | Discharge status: Home / Self Care | Payer: Medicare Other | Attending: Internal Medicine | Admitting: Internal Medicine

## 2020-09-09 DIAGNOSIS — I5022 Chronic systolic (congestive) heart failure: Secondary | ICD-10-CM | POA: Diagnosis present

## 2020-09-09 DIAGNOSIS — Z20822 Contact with and (suspected) exposure to covid-19: Secondary | ICD-10-CM | POA: Diagnosis not present

## 2020-09-09 DIAGNOSIS — I2510 Atherosclerotic heart disease of native coronary artery without angina pectoris: Secondary | ICD-10-CM | POA: Diagnosis not present

## 2020-09-09 DIAGNOSIS — Z87891 Personal history of nicotine dependence: Secondary | ICD-10-CM | POA: Diagnosis not present

## 2020-09-09 DIAGNOSIS — I442 Atrioventricular block, complete: Secondary | ICD-10-CM | POA: Diagnosis not present

## 2020-09-09 DIAGNOSIS — D62 Acute posthemorrhagic anemia: Secondary | ICD-10-CM | POA: Diagnosis not present

## 2020-09-09 DIAGNOSIS — Z992 Dependence on renal dialysis: Secondary | ICD-10-CM

## 2020-09-09 DIAGNOSIS — I959 Hypotension, unspecified: Secondary | ICD-10-CM | POA: Diagnosis present

## 2020-09-09 DIAGNOSIS — D631 Anemia in chronic kidney disease: Secondary | ICD-10-CM | POA: Diagnosis not present

## 2020-09-09 DIAGNOSIS — E1122 Type 2 diabetes mellitus with diabetic chronic kidney disease: Secondary | ICD-10-CM | POA: Diagnosis not present

## 2020-09-09 DIAGNOSIS — I509 Heart failure, unspecified: Secondary | ICD-10-CM | POA: Diagnosis not present

## 2020-09-09 DIAGNOSIS — Z79899 Other long term (current) drug therapy: Secondary | ICD-10-CM

## 2020-09-09 DIAGNOSIS — R001 Bradycardia, unspecified: Secondary | ICD-10-CM | POA: Diagnosis present

## 2020-09-09 DIAGNOSIS — K922 Gastrointestinal hemorrhage, unspecified: Secondary | ICD-10-CM | POA: Diagnosis not present

## 2020-09-09 DIAGNOSIS — R55 Syncope and collapse: Secondary | ICD-10-CM | POA: Diagnosis not present

## 2020-09-09 DIAGNOSIS — Z955 Presence of coronary angioplasty implant and graft: Secondary | ICD-10-CM

## 2020-09-09 DIAGNOSIS — N186 End stage renal disease: Secondary | ICD-10-CM | POA: Diagnosis present

## 2020-09-09 DIAGNOSIS — M898X9 Other specified disorders of bone, unspecified site: Secondary | ICD-10-CM | POA: Diagnosis present

## 2020-09-09 DIAGNOSIS — I132 Hypertensive heart and chronic kidney disease with heart failure and with stage 5 chronic kidney disease, or end stage renal disease: Secondary | ICD-10-CM | POA: Diagnosis not present

## 2020-09-09 DIAGNOSIS — Z794 Long term (current) use of insulin: Secondary | ICD-10-CM

## 2020-09-09 DIAGNOSIS — N2581 Secondary hyperparathyroidism of renal origin: Secondary | ICD-10-CM | POA: Diagnosis not present

## 2020-09-09 DIAGNOSIS — Z7902 Long term (current) use of antithrombotics/antiplatelets: Secondary | ICD-10-CM | POA: Diagnosis not present

## 2020-09-09 DIAGNOSIS — J9611 Chronic respiratory failure with hypoxia: Secondary | ICD-10-CM | POA: Diagnosis not present

## 2020-09-09 DIAGNOSIS — Z833 Family history of diabetes mellitus: Secondary | ICD-10-CM | POA: Diagnosis not present

## 2020-09-09 DIAGNOSIS — K921 Melena: Secondary | ICD-10-CM | POA: Diagnosis not present

## 2020-09-09 DIAGNOSIS — E1129 Type 2 diabetes mellitus with other diabetic kidney complication: Secondary | ICD-10-CM | POA: Diagnosis present

## 2020-09-09 DIAGNOSIS — Z888 Allergy status to other drugs, medicaments and biological substances status: Secondary | ICD-10-CM | POA: Diagnosis not present

## 2020-09-09 DIAGNOSIS — K219 Gastro-esophageal reflux disease without esophagitis: Secondary | ICD-10-CM | POA: Diagnosis not present

## 2020-09-09 DIAGNOSIS — E7800 Pure hypercholesterolemia, unspecified: Secondary | ICD-10-CM | POA: Diagnosis not present

## 2020-09-09 DIAGNOSIS — J90 Pleural effusion, not elsewhere classified: Secondary | ICD-10-CM | POA: Diagnosis not present

## 2020-09-09 LAB — CBC
HCT: 26 % — ABNORMAL LOW (ref 39.0–52.0)
Hemoglobin: 8.2 g/dL — ABNORMAL LOW (ref 13.0–17.0)
MCH: 30.3 pg (ref 26.0–34.0)
MCHC: 31.5 g/dL (ref 30.0–36.0)
MCV: 95.9 fL (ref 80.0–100.0)
Platelets: 251 10*3/uL (ref 150–400)
RBC: 2.71 MIL/uL — ABNORMAL LOW (ref 4.22–5.81)
RDW: 15.9 % — ABNORMAL HIGH (ref 11.5–15.5)
WBC: 7.7 10*3/uL (ref 4.0–10.5)
nRBC: 0 % (ref 0.0–0.2)

## 2020-09-09 LAB — CBC WITH DIFFERENTIAL/PLATELET
Abs Immature Granulocytes: 0.04 10*3/uL (ref 0.00–0.07)
Basophils Absolute: 0 10*3/uL (ref 0.0–0.1)
Basophils Relative: 0 %
Eosinophils Absolute: 0.2 10*3/uL (ref 0.0–0.5)
Eosinophils Relative: 2 %
HCT: 27.6 % — ABNORMAL LOW (ref 39.0–52.0)
Hemoglobin: 9 g/dL — ABNORMAL LOW (ref 13.0–17.0)
Immature Granulocytes: 0 %
Lymphocytes Relative: 9 %
Lymphs Abs: 0.8 10*3/uL (ref 0.7–4.0)
MCH: 30.8 pg (ref 26.0–34.0)
MCHC: 32.6 g/dL (ref 30.0–36.0)
MCV: 94.5 fL (ref 80.0–100.0)
Monocytes Absolute: 0.9 10*3/uL (ref 0.1–1.0)
Monocytes Relative: 9 %
Neutro Abs: 7.4 10*3/uL (ref 1.7–7.7)
Neutrophils Relative %: 80 %
Platelets: 245 10*3/uL (ref 150–400)
RBC: 2.92 MIL/uL — ABNORMAL LOW (ref 4.22–5.81)
RDW: 15.9 % — ABNORMAL HIGH (ref 11.5–15.5)
WBC: 9.3 10*3/uL (ref 4.0–10.5)
nRBC: 0 % (ref 0.0–0.2)

## 2020-09-09 LAB — COMPREHENSIVE METABOLIC PANEL
ALT: 22 U/L (ref 0–44)
AST: 18 U/L (ref 15–41)
Albumin: 3.3 g/dL — ABNORMAL LOW (ref 3.5–5.0)
Alkaline Phosphatase: 85 U/L (ref 38–126)
Anion gap: 17 — ABNORMAL HIGH (ref 5–15)
BUN: 25 mg/dL — ABNORMAL HIGH (ref 8–23)
CO2: 27 mmol/L (ref 22–32)
Calcium: 8.9 mg/dL (ref 8.9–10.3)
Chloride: 91 mmol/L — ABNORMAL LOW (ref 98–111)
Creatinine, Ser: 5.79 mg/dL — ABNORMAL HIGH (ref 0.61–1.24)
GFR, Estimated: 9 mL/min — ABNORMAL LOW (ref >60)
Glucose, Bld: 201 mg/dL — ABNORMAL HIGH (ref 70–99)
Potassium: 4.4 mmol/L (ref 3.5–5.1)
Sodium: 135 mmol/L (ref 135–145)
Total Bilirubin: 0.3 mg/dL (ref 0.3–1.2)
Total Protein: 7.7 g/dL (ref 6.5–8.1)

## 2020-09-09 LAB — MAGNESIUM: Magnesium: 2.2 mg/dL (ref 1.7–2.4)

## 2020-09-09 LAB — CBG MONITORING, ED: Glucose-Capillary: 196 mg/dL — ABNORMAL HIGH (ref 70–99)

## 2020-09-09 LAB — PROTIME-INR
INR: 1 (ref 0.8–1.2)
Prothrombin Time: 13.3 seconds (ref 11.4–15.2)

## 2020-09-09 LAB — TROPONIN I (HIGH SENSITIVITY)
Troponin I (High Sensitivity): 12 ng/L (ref <18)
Troponin I (High Sensitivity): 19 ng/L — ABNORMAL HIGH (ref <18)

## 2020-09-09 LAB — OCCULT BLOOD X 1 CARD TO LAB, STOOL: Fecal Occult Bld: POSITIVE — AB

## 2020-09-09 LAB — TSH: TSH: 4.302 u[IU]/mL (ref 0.350–4.500)

## 2020-09-09 LAB — MRSA NEXT GEN BY PCR, NASAL: MRSA by PCR Next Gen: NOT DETECTED

## 2020-09-09 IMAGING — DX DG CHEST 1V PORT
1 series · 1 of 1 positions shown · non-contrast
Comparison: Chest radiograph [DATE]

CLINICAL DATA: Syncope, CHF

EXAM:
PORTABLE CHEST 1 VIEW

[chest ap]
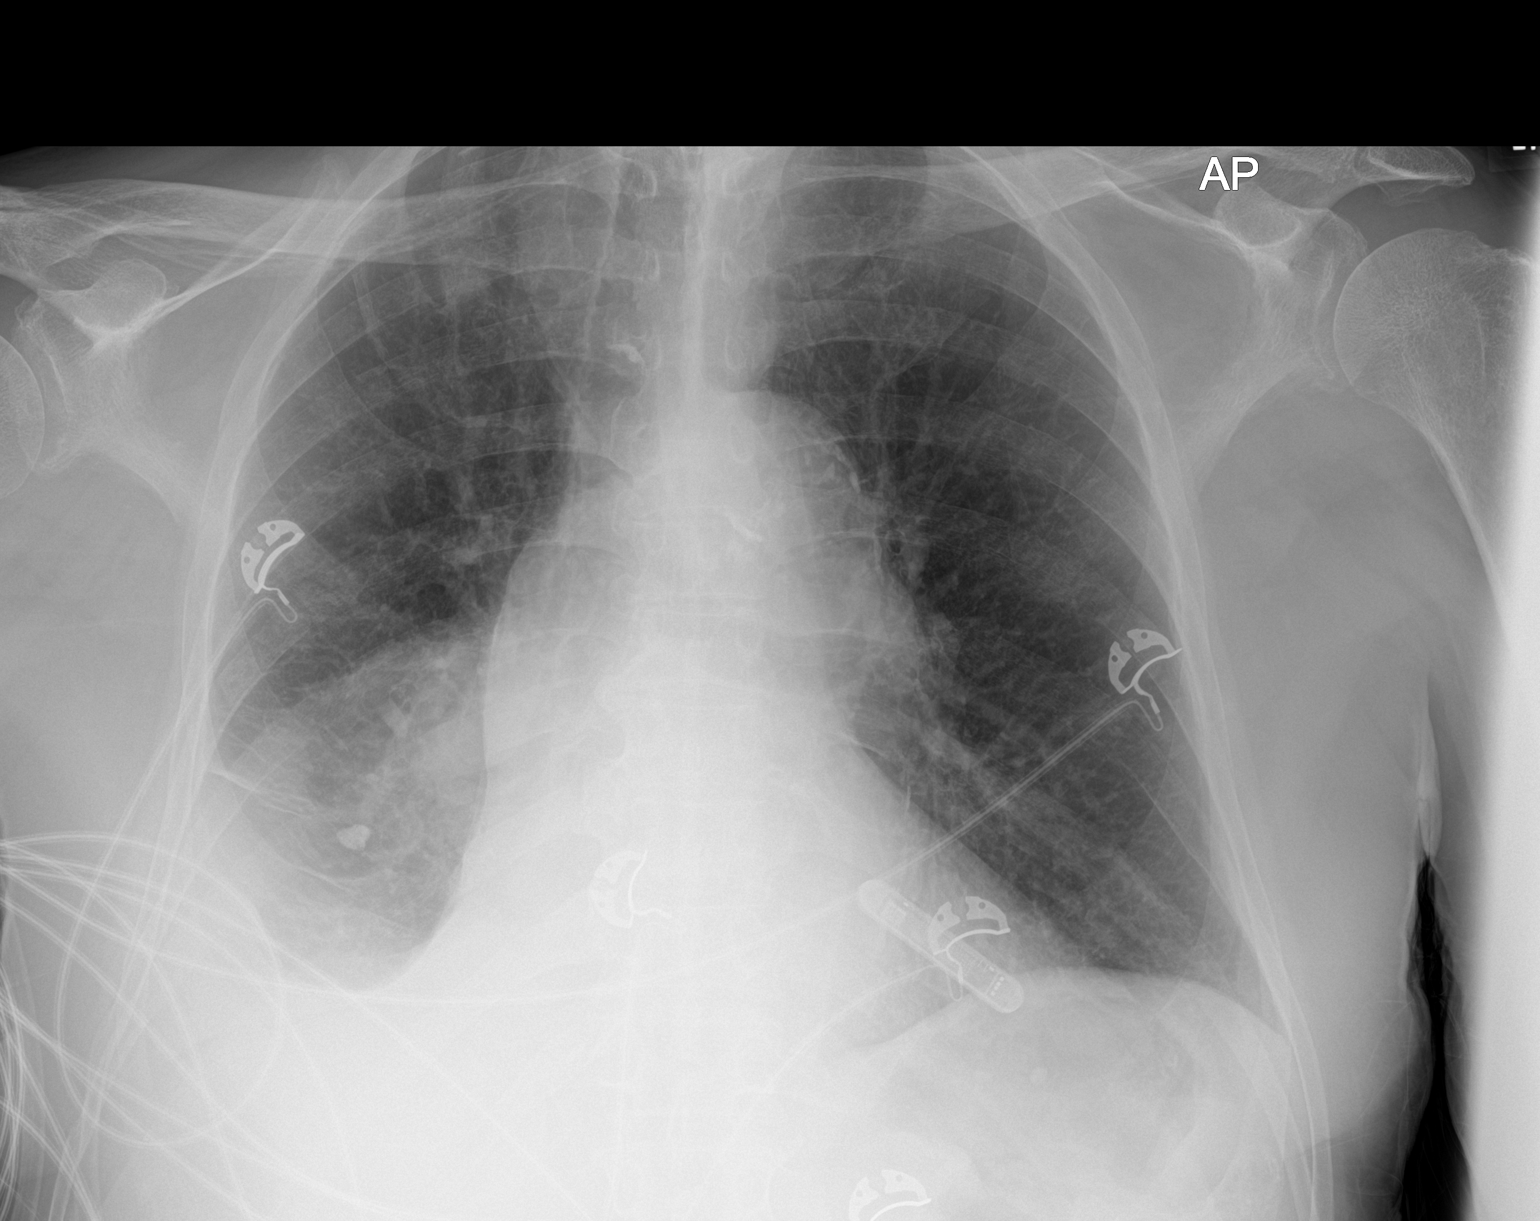

[1 of 1 positions shown; findings below may reference images not displayed]

FINDINGS: The cardiomediastinal silhouette is stable. A cardiac loop recorder
is again seen.

There is a small to medium size right pleural effusion with adjacent
airspace disease, similar to the prior study. The right upper lung
is well aerated. The left lung is clear. There is no left effusion.
There is no pneumothorax.

The bones are stable.
IMPRESSION: No significant interval change in size of the small to medium right
pleural effusion with adjacent airspace disease since [DATE].

## 2020-09-09 MED ORDER — ACETAMINOPHEN 500 MG PO TABS
1000.0000 mg | ORAL_TABLET | Freq: Once | ORAL | Status: AC
  Administered 2020-09-09: 1000 mg via ORAL
  Filled 2020-09-09: qty 2

## 2020-09-09 MED ORDER — RENA-VITE PO TABS
1.0000 | ORAL_TABLET | Freq: Every day | ORAL | Status: DC
  Administered 2020-09-09 – 2020-09-13 (×4): 1 via ORAL
  Filled 2020-09-09 (×4): qty 1

## 2020-09-09 MED ORDER — PANTOPRAZOLE SODIUM 40 MG IV SOLR
40.0000 mg | Freq: Two times a day (BID) | INTRAVENOUS | Status: DC
Start: 2020-09-09 — End: 2020-09-10
  Administered 2020-09-09 – 2020-09-10 (×2): 40 mg via INTRAVENOUS
  Filled 2020-09-09 (×2): qty 40

## 2020-09-09 MED ORDER — DIALYVITE 3000 3 MG PO TABS: 1.0000 | ORAL_TABLET | Freq: Every day | ORAL | Status: DC

## 2020-09-09 MED ORDER — CINACALCET HCL 30 MG PO TABS: 30.0000 mg | ORAL_TABLET | Freq: Two times a day (BID) | ORAL | Status: DC

## 2020-09-09 MED ORDER — SODIUM CHLORIDE 0.9 % IV BOLUS
500.0000 mL | Freq: Once | INTRAVENOUS | Status: AC
  Administered 2020-09-09: 500 mL via INTRAVENOUS

## 2020-09-09 MED ORDER — INSULIN ASPART 100 UNIT/ML IJ SOLN
0.0000 [IU] | Freq: Three times a day (TID) | INTRAMUSCULAR | Status: DC
  Administered 2020-09-10: 2 [IU] via SUBCUTANEOUS
  Administered 2020-09-11 – 2020-09-12 (×3): 1 [IU] via SUBCUTANEOUS
  Administered 2020-09-12 – 2020-09-13 (×2): 2 [IU] via SUBCUTANEOUS

## 2020-09-09 MED ORDER — ATORVASTATIN CALCIUM 80 MG PO TABS
80.0000 mg | ORAL_TABLET | Freq: Every day | ORAL | Status: DC
  Administered 2020-09-09 – 2020-09-13 (×4): 80 mg via ORAL
  Filled 2020-09-09 (×4): qty 1

## 2020-09-09 MED ORDER — SEVELAMER CARBONATE 800 MG PO TABS
1600.0000 mg | ORAL_TABLET | Freq: Three times a day (TID) | ORAL | Status: DC
  Administered 2020-09-10 – 2020-09-12 (×5): 1600 mg via ORAL
  Filled 2020-09-09 (×6): qty 2

## 2020-09-09 MED ORDER — ACETAMINOPHEN 650 MG RE SUPP: 650.0000 mg | Freq: Four times a day (QID) | RECTAL | Status: DC | PRN

## 2020-09-09 MED ORDER — ACETAMINOPHEN 325 MG PO TABS
650.0000 mg | ORAL_TABLET | Freq: Four times a day (QID) | ORAL | Status: DC | PRN
  Administered 2020-09-09 – 2020-09-12 (×4): 650 mg via ORAL
  Filled 2020-09-09 (×4): qty 2

## 2020-09-09 NOTE — Consult Note (Signed)
Cardiology Consultation:   Patient ID: Nicholas [name] MRN: [id number]; DOB: 03-Jan-1931  Admit date: 09/09/2020 Date of Consult: 09/09/2020  PCP:  Nicholas [name] No   [facility] Providers Cardiologist:  Sinclair Grooms, MD        Patient Profile:   Nicholas [name] is a 87 y.o. male with a hx of CAD s/p PCI in June 2020, h/o GIB requiring blood transfusion, ESRD on HD, DM2, HTN, dyslipidemia and recurrent syncope currently monitored by ILR who is being seen 09/09/2020 for the evaluation of syncope at the request of Dr Nicholas [name].  History of Present Illness:   Nicholas [name] has the above history and presented to the ED at [facility] earlier today after 2 episodes of rectal bleeding and an episode of syncope. The rectal bleeding episodes occurred yesterday, but the syncopal episode occurred about an hour before he presented to the ED for evaluation. Pt apparently had just been for a walk outside and passed out after returning to the house. Per notes at [facility] ED, the patient had an episode of bradycardia with HR dropping into the 40s while in the ED, and then had a run of high-grade AVB that was captured on telemetry strip (scanned into the Media tab in Epic). This resolved spontaneously. The ILR has not yet been interrogated. Hemoccult + in the ED with Hgb 9.0.   Past Medical History:  Diagnosis Date   Arthritis    Chronic kidney disease    Diabetes mellitus ([place])    Dialysis patient ([place])    GERD (gastroesophageal reflux disease)    occ   Heart murmur    Hypercholesteremia    Hypertension    Psoriasis     Past Surgical History:  Procedure Laterality Date   CARDIAC CATHETERIZATION  09   "some blockage" with collaterals by cath at [facility] ~ 2009; no intervention required; reportedly, no routine cardiology f/u recommended    COLONOSCOPY WITH PROPOFOL N/A 07/06/2020   Procedure: COLONOSCOPY WITH PROPOFOL;  Surgeon: Ronald Lobo, MD;  Location: [place];  Service: Endoscopy;  Laterality: N/A;    CORONARY ATHERECTOMY N/A 08/09/2018   Procedure: CORONARY ATHERECTOMY;  Surgeon: Jettie Booze, MD;  Location: [place] CV LAB;  Service: Cardiovascular;  Laterality: N/A;   CORONARY STENT INTERVENTION N/A 08/09/2018   Procedure: CORONARY STENT INTERVENTION;  Surgeon: Jettie Booze, MD;  Location: [place] CV LAB;  Service: Cardiovascular;  Laterality: N/A;   ESOPHAGOGASTRODUODENOSCOPY (EGD) WITH PROPOFOL N/A 07/06/2020   Procedure: ESOPHAGOGASTRODUODENOSCOPY (EGD) WITH PROPOFOL;  Surgeon: Ronald Lobo, MD;  Location: [place];  Service: Endoscopy;  Laterality: N/A;   INGUINAL HERNIA REPAIR Right 04/13/2012   Procedure: HERNIA REPAIR INGUINAL INCARCERATED;  Surgeon: Gayland Curry, MD;  Location: [place];  Service: General;  Laterality: Right;   INSERTION OF MESH Right 04/13/2012   Procedure: INSERTION OF MESH;  Surgeon: Gayland Curry, MD;  Location: [place];  Service: General;  Laterality: Right;   IR THORACENTESIS ASP PLEURAL SPACE W/IMG GUIDE  06/08/2020   LEFT HEART CATH N/A 08/09/2018   Procedure: Left Heart Cath;  Surgeon: Jettie Booze, MD;  Location: [place] CV LAB;  Service: Cardiovascular;  Laterality: N/A;   LEFT HEART CATH AND CORONARY ANGIOGRAPHY N/A 08/08/2018   Procedure: LEFT HEART CATH AND CORONARY ANGIOGRAPHY;  Surgeon: Troy Sine, MD;  Location: [place] CV LAB;  Service: Cardiovascular;  Laterality: N/A;   LEFT HEART CATH AND CORONARY ANGIOGRAPHY N/A 06/05/2020   Procedure: LEFT HEART CATH  AND CORONARY ANGIOGRAPHY;  Surgeon: Belva Crome, MD;  Location: [place] CV LAB;  Service: Cardiovascular;  Laterality: N/A;   VASECTOMY       Home Medications:  Prior to Admission medications   Medication Sig Start Date End Date Taking? Authorizing Provider  acetaminophen (TYLENOL) 500 MG tablet Take 500 mg by mouth 3 (three) times daily as needed for moderate pain.    [provider]  atorvastatin (LIPITOR) 80 MG tablet Take 80 mg by mouth  at bedtime.    [provider]  cinacalcet (SENSIPAR) 30 MG tablet Take 30 mg by mouth in the morning, at noon, and at bedtime. 08/06/20   [provider]  clopidogrel (PLAVIX) 75 MG tablet Take 1 tablet (75 mg total) by mouth daily with breakfast. 07/14/20   Mercy Riding, MD  folic acid-vitamin b complex-vitamin c-selenium-zinc (DIALYVITE) 3 MG TABS tablet Take 1 tablet by mouth daily.    [provider]  insulin NPH-regular Human (70-30) 100 UNIT/ML injection Inject 20 Units into the skin 2 (two) times daily with a meal.    [provider]  isosorbide dinitrate (ISORDIL) 20 MG tablet Take 1 tablet (20 mg total) by mouth 2 (two) times daily. 06/08/20   Kathie Dike, MD  lidocaine-prilocaine (EMLA) cream Apply 1 application topically See admin instructions. Apply topically 30 minutes prior to dialysis on Tuesday, Thursday, Saturday 11/15/18   [provider]  methocarbamol (ROBAXIN) 500 MG tablet Take 1 tablet (500 mg total) by mouth every 8 (eight) hours as needed for muscle spasms. We recommend you take yourself off this medications slowly 07/08/20   Mercy Riding, MD  Multiple Vitamin (MULTIVITAMIN WITH MINERALS) TABS tablet Take 1 tablet by mouth daily.    [provider]  nitroGLYCERIN (NITROSTAT) 0.4 MG SL tablet Place 1 tablet (0.4 mg total) under the tongue every 5 (five) minutes x 3 doses as needed for chest pain. 08/10/18   [name], MD  Nutritional Supplements (NEPRO/CARBSTEADY PO) Take 237 mLs by mouth daily. vanilla    [provider]  sevelamer carbonate (RENVELA) 800 MG tablet Take 1,600 mg by mouth 3 (three) times daily. 05/22/19   [provider]    Inpatient Medications: Scheduled Meds:  atorvastatin  80 mg Oral QHS   [START ON 09/10/2020] cinacalcet  30 mg Oral BID WC   folic acid-vitamin b complex-vitamin c-selenium-zinc  1 tablet Oral Daily   [START ON 09/10/2020] insulin aspart  0-6 Units  Subcutaneous TID WC   pantoprazole (PROTONIX) IV  40 mg Intravenous Q12H   sevelamer carbonate  1,600 mg Oral TID   Continuous Infusions:  PRN Meds: acetaminophen **OR** acetaminophen  Allergies:    Allergies  Allergen Reactions   Hydrochlorothiazide Other (See Comments)    Unknown [facility]    Social History:   Social History   Socioeconomic History   Marital status: Married    Spouse name: Not on file   Number of children: Not on file   Years of education: Not on file   Highest education level: Not on file  Occupational History   Not on file  Tobacco Use   Smoking status: Former    Packs/day: 1.00    Years: 15.00    Pack years: 15.00    Types: Cigarettes    Quit date: 03/29/1987    Years since quitting: 33.4   Smokeless tobacco: Never  Substance and Sexual Activity   Alcohol use: No   Drug  use: No   Sexual activity: Not on file  Other Topics Concern   Not on file  Social History Narrative   Not on file   Social Determinants of Health   Financial Resource Strain: Not on file  Food Insecurity: Not on file  Transportation Needs: Not on file  Physical Activity: Not on file  Stress: Not on file  Social Connections: Not on file  Intimate Partner Violence: Not on file    Family History:    Family History  Problem Relation Age of Onset   Diabetes Mother    Stroke Father    Diabetes Sister    Diabetes Brother      ROS:  Please see the history of present illness.   All other ROS reviewed and negative.     Physical Exam/Data:   Vitals:   09/09/20 1605 09/09/20 1622 09/09/20 1700 09/09/20 1945  BP: 120/66 130/71 117/68 (!) 147/82  Pulse: (!) 104 (!) 115 98 98  Resp: 16 (!) 21 18 18   Temp:    97.8 F (36.6 C)  TempSrc:    Oral  SpO2: 95% 94% 100% 99%  Weight:    68.7 kg  Height:    5\' 7"  (1.702 m)   No intake or output data in the 24 hours ending 09/09/20 2029 Last 3 Weights 09/09/2020 09/02/2020 07/07/2020  Weight (lbs) 151 lb 7.3 oz 155 lb  6.4 oz 159 lb 13.3 oz  Weight (kg) 68.7 kg 70.489 kg 72.5 kg     Body mass index is 23.72 kg/m.  General:  Well nourished, well developed, in no acute distress HEENT: normal Lymph: no adenopathy Neck: no JVD Endocrine:  No thryomegaly Vascular: No carotid bruits; DP pulses 2+ bilaterally   Cardiac:  normal S1, S2; RRR; no murmur  Lungs:  clear to auscultation bilaterally, no wheezing, rhonchi or rales  Abd: soft, nontender, no hepatomegaly  Ext: no edema Musculoskeletal:  No deformities Skin: warm and dry  Neuro:   no focal abnormalities noted Psych:  Normal affect   EKG:  The EKG was personally reviewed and demonstrates:  ST  Telemetry:  Telemetry was personally reviewed and demonstrates:  NSR/ST Tele strip from [facility] shows SB with high-grade AVB (2:1 vs CHB)  Relevant CV Studies: TTE 07-05-20  1. Left ventricular ejection fraction, by estimation, is 60 to 65%. The  left ventricle has normal function. Left ventricular endocardial border  not optimally defined to evaluate regional wall motion. There is mild left  ventricular hypertrophy. Left  ventricular diastolic parameters are consistent with Grade I diastolic  dysfunction (impaired relaxation). Elevated left ventricular end-diastolic  pressure.   2. Right ventricular systolic function is normal. The right ventricular  size is normal. There is normal pulmonary artery systolic pressure. The  estimated right ventricular systolic pressure is 67.2 mmHg.   3. Left atrial size was mildly dilated.   4. The mitral valve is normal in structure. No evidence of mitral valve  regurgitation. No evidence of mitral stenosis.   5. The aortic valve has an indeterminant number of cusps. Aortic valve  regurgitation is not visualized. Mild aortic valve sclerosis is present,  with no evidence of aortic valve stenosis.   6. The inferior vena cava is normal in size with greater than 50%  respiratory variability, suggesting right atrial pressure  of 3 mmHg.   06-05-20 LHC Mid LAD lesion is 60% stenosed.   40 to 60% eccentric distal left main not greatly different than on prior  angiography.  Left main is difficult to lay out and not easily visualized due to a loop recorder that block imaging. The peviously stented proximal LAD contains 30% in-stent restenosis.  The jailed diagonal is totally occluded but supplied by collaterals.  The mid LAD beyond the stent contains a 50-70 % eccentric narrowing unchanged from prior imaging. Circumflex territory is small.  The proximal vessel contains 80% stenosis and is unchanged compared to the prior angiogram.  A large obtuse marginal is totally occluded and supplied by left to left collaterals. Right coronary is totally occluded in the distal vessel receives right to right and left to right collaterals. LVEDP 10 mmHg.  Ventriculography was not performed.   RECOMMENDATIONS:   Compared to prior images, left main is about the same as before.  Symptoms could be related to the left main.  The patient's age, comorbidities, and unwillingness to consider coronary bypass grafting leave few options for management.  PCI would be single remaining vessel and high risk.  Discussed with patient who prefers conservative management at this point. Continue medical therapy. Guarded prognosis.   Laboratory Data:  High Sensitivity Troponin:   Recent Labs  Lab 09/09/20 1551  TROPONINIHS 12     Chemistry Recent Labs  Lab 09/09/20 1551  NA 135  K 4.4  CL 91*  CO2 27  GLUCOSE 201*  BUN 25*  CREATININE 5.79*  CALCIUM 8.9  GFRNONAA 9*  ANIONGAP 17*    Recent Labs  Lab 09/09/20 1551  PROT 7.7  ALBUMIN 3.3*  AST 18  ALT 22  ALKPHOS 85  BILITOT 0.3   Hematology Recent Labs  Lab 09/09/20 1551  WBC 9.3  RBC 2.92*  HGB 9.0*  HCT 27.6*  MCV 94.5  MCH 30.8  MCHC 32.6  RDW 15.9*  PLT 245   BNPNo results for input(s): BNP, PROBNP in the last 168 hours.  DDimer No results for input(s): DDIMER  in the last 168 hours.   Radiology/Studies:  DG Chest Portable 1 View  Result Date: 09/09/2020 CLINICAL DATA:  Syncope, CHF EXAM: PORTABLE CHEST 1 VIEW COMPARISON:  Chest radiograph 07/06/2020 FINDINGS: The cardiomediastinal silhouette is stable. A cardiac loop recorder is again seen. There is a small to medium size right pleural effusion with adjacent airspace disease, similar to the prior study. The right upper lung is well aerated. The left lung is clear. There is no left effusion. There is no pneumothorax. The bones are stable. IMPRESSION: No significant interval change in size of the small to medium right pleural effusion with adjacent airspace disease since 07/06/2020. Electronically Signed   By: Valetta Mole M.D.   On: 09/09/2020 15:54     Assessment and Plan:   Syncope: high-grade AVB and bradycardia noted on telemetry. Pt has ILR; will plan for device interrogation for more information in the AM. Plan for EP c/s. Not currently on any AV nodal blockers. CAD: stable angina; no acute sx at this time. Cont home regimen and current medical therapy Dyslipidemia/HTN: restart home meds and adjust PRN GIB: mgmt as per medicine/GI service ESRD: on hemodialysis as per nephrology   Risk Assessment/Risk Scores:                For questions or updates, please contact [place] Please consult [email or web address] for contact info under    Signed, Rudean Curt, MD, [facility] 09/09/2020 8:29 PM

## 2020-09-09 NOTE — H&P (Signed)
History and Physical    Nicholas [name] MEQ:[id number] DOB: Aug 18, 1930 DOA: 09/09/2020  PCP: Pcp, No  Patient coming from: Home.  Chief Complaint: Syncope.  HPI: Nicholas [name] is a 87 y.o. male with history of CAD status post stenting, ESRD on hemodialysis Tuesday Thursday Saturday, diabetes mellitus type 2 had a syncopal episode this afternoon after coming back from a walk.  Last night patient had 2 episodes of frank rectal bleeding the first 1 was a large amount and the second one half half hour of the first 1 was minimal.  This morning patient had a normal bowel movement.  Denies any chest pain or shortness of breath.  Denies any abdominal pain nausea vomiting or diarrhea.  Patient had a syncopal episode while on a sitting position.  Did not have any incontinence of urine.  May have lasted for couple of minutes.  Per patient blood sugar was normal.  ED Course: In the ER patient was hypotensive bradycardic and briefly had high degree AV block.  Patient was given a fluid bolus and at the time of transfer patient's heart rate is back to normal.  Cardiology was consulted.  Labs show hemoglobin of 9 and the last one in June was 8.9.  High sensitive troponin was 12.  Blood sugar was 201.  TSH is 4.18.  COVID test is pending.  Chest x-ray shows chronic findings.  Review of Systems: As per HPI, rest all negative.   Past Medical History:  Diagnosis Date   Arthritis    Chronic kidney disease    Diabetes mellitus ([place])    Dialysis patient ([place])    GERD (gastroesophageal reflux disease)    occ   Heart murmur    Hypercholesteremia    Hypertension    Psoriasis     Past Surgical History:  Procedure Laterality Date   CARDIAC CATHETERIZATION  09   "some blockage" with collaterals by cath at [facility] ~ 2009; no intervention required; reportedly, no routine cardiology f/u recommended    COLONOSCOPY WITH PROPOFOL N/A 07/06/2020   Procedure: COLONOSCOPY WITH PROPOFOL;  Surgeon: Ronald Lobo, MD;   Location: [place];  Service: Endoscopy;  Laterality: N/A;   CORONARY ATHERECTOMY N/A 08/09/2018   Procedure: CORONARY ATHERECTOMY;  Surgeon: Jettie Booze, MD;  Location: [place] CV LAB;  Service: Cardiovascular;  Laterality: N/A;   CORONARY STENT INTERVENTION N/A 08/09/2018   Procedure: CORONARY STENT INTERVENTION;  Surgeon: Jettie Booze, MD;  Location: [place] [facility];  Service: Cardiovascular;  Laterality: N/A;   ESOPHAGOGASTRODUODENOSCOPY (EGD) WITH PROPOFOL N/A 07/06/2020   Procedure: ESOPHAGOGASTRODUODENOSCOPY (EGD) WITH PROPOFOL;  Surgeon: Ronald Lobo, MD;  Location: [place];  Service: Endoscopy;  Laterality: N/A;   INGUINAL HERNIA REPAIR Right 04/13/2012   Procedure: HERNIA REPAIR INGUINAL INCARCERATED;  Surgeon: Gayland Curry, MD;  Location: [place];  Service: General;  Laterality: Right;   INSERTION OF MESH Right 04/13/2012   Procedure: INSERTION OF MESH;  Surgeon: Gayland Curry, MD;  Location: [place];  Service: General;  Laterality: Right;   IR THORACENTESIS ASP PLEURAL SPACE W/IMG GUIDE  06/08/2020   LEFT HEART CATH N/A 08/09/2018   Procedure: Left Heart Cath;  Surgeon: Jettie Booze, MD;  Location: [place] [facility] LAB;  Service: Cardiovascular;  Laterality: N/A;   LEFT HEART CATH AND CORONARY ANGIOGRAPHY N/A 08/08/2018   Procedure: LEFT HEART CATH AND CORONARY ANGIOGRAPHY;  Surgeon: Troy Sine, MD;  Location: [place] CV LAB;  Service: Cardiovascular;  Laterality: N/A;  LEFT HEART CATH AND CORONARY ANGIOGRAPHY N/A 06/05/2020   Procedure: LEFT HEART CATH AND CORONARY ANGIOGRAPHY;  Surgeon: Belva Crome, MD;  Location: [place] CV LAB;  Service: Cardiovascular;  Laterality: N/A;   VASECTOMY       reports that he quit smoking about 33 years ago. His smoking use included cigarettes. He has a 15.00 pack-year smoking history. He has never used smokeless tobacco. He reports that he does not drink alcohol and does not use drugs.  Allergies   Allergen Reactions   Hydrochlorothiazide Other (See Comments)    [facility]    Family History  Problem Relation Age of Onset   Diabetes Mother    Stroke Father    Diabetes Sister    Diabetes Brother     Prior to Admission medications   Medication Sig Start Date End Date Taking? Authorizing Provider  acetaminophen (TYLENOL) 500 MG tablet Take 500 mg by mouth 3 (three) times daily as needed for moderate pain.    [provider]  atorvastatin (LIPITOR) 80 MG tablet Take 80 mg by mouth at bedtime.    [provider]  cinacalcet (SENSIPAR) 30 MG tablet Take 30 mg by mouth in the morning, at noon, and at bedtime. 08/06/20   [provider]  clopidogrel (PLAVIX) 75 MG tablet Take 1 tablet (75 mg total) by mouth daily with breakfast. 07/14/20   Mercy Riding, MD  folic acid-vitamin b complex-vitamin c-selenium-zinc (DIALYVITE) 3 MG TABS tablet Take 1 tablet by mouth daily.    [provider]  insulin NPH-regular Human (70-30) 100 UNIT/ML injection Inject 20 Units into the skin 2 (two) times daily with a meal.    [provider]  isosorbide dinitrate (ISORDIL) 20 MG tablet Take 1 tablet (20 mg total) by mouth 2 (two) times daily. 06/08/20   Kathie Dike, MD  lidocaine-prilocaine (EMLA) cream Apply 1 application topically See admin instructions. Apply topically 30 minutes prior to dialysis on Tuesday, Thursday, Saturday 11/15/18   [provider]  methocarbamol (ROBAXIN) 500 MG tablet Take 1 tablet (500 mg total) by mouth every 8 (eight) hours as needed for muscle spasms. We recommend you take yourself off this medications slowly 07/08/20   Mercy Riding, MD  Multiple Vitamin (MULTIVITAMIN WITH MINERALS) TABS tablet Take 1 tablet by mouth daily.    [provider]  nitroGLYCERIN (NITROSTAT) 0.4 MG SL tablet Place 1 tablet (0.4 mg total) under the tongue every 5 (five) minutes x 3 doses as needed for chest pain. 08/10/18    [name], MD  Nutritional Supplements (NEPRO/CARBSTEADY PO) Take 237 mLs by mouth daily. vanilla    [provider]  sevelamer carbonate (RENVELA) 800 MG tablet Take 1,600 mg by mouth 3 (three) times daily. 05/22/19   [provider]    Physical Exam: Constitutional: Moderately built and nourished. Vitals:   09/09/20 1605 09/09/20 1622 09/09/20 1700 09/09/20 1945  BP: 120/66 130/71 117/68 (!) 147/82  Pulse: (!) 104 (!) 115 98 98  Resp: 16 (!) 21 18 18   Temp:    97.8 F (36.6 C)  TempSrc:    Oral  SpO2: 95% 94% 100% 99%  Weight:    68.7 kg  Height:    5\' 7"  (1.702 m)   Eyes: Anicteric no pallor. ENMT: No discharge from the ears eyes nose and mouth. Neck: No mass felt.  No neck rigidity. Respiratory: No rhonchi or crepitations. Cardiovascular: S1-S2 heard. Abdomen: Soft nontender bowel sound  present. Musculoskeletal: No edema. Skin: No rash. Neurologic: Alert awake oriented to time place and person.  Moves all extremities. Psychiatric: Appears normal.  Normal affect.   Labs on Admission: I have personally reviewed following labs and imaging studies  CBC: Recent Labs  Lab 09/09/20 1551  WBC 9.3  NEUTROABS 7.4  HGB 9.0*  HCT 27.6*  MCV 94.5  PLT 638   Basic Metabolic Panel: Recent Labs  Lab 09/09/20 1551  NA 135  K 4.4  CL 91*  CO2 27  GLUCOSE 201*  BUN 25*  CREATININE 5.79*  CALCIUM 8.9  MG 2.2   GFR: Estimated Creatinine Clearance: 8.4 mL/min (A) (by C-G formula based on SCr of 5.79 mg/dL (H)). Liver Function Tests: Recent Labs  Lab 09/09/20 1551  AST 18  ALT 22  ALKPHOS 85  BILITOT 0.3  PROT 7.7  ALBUMIN 3.3*   No results for input(s): LIPASE, AMYLASE in the last 168 hours. No results for input(s): AMMONIA in the last 168 hours. Coagulation Profile: Recent Labs  Lab 09/09/20 1551  INR 1.0   Cardiac Enzymes: No results for input(s): CKTOTAL, CKMB, CKMBINDEX, TROPONINI in the last 168 hours. BNP (last 3  results) No results for input(s): PROBNP in the last 8760 hours. HbA1C: No results for input(s): HGBA1C in the last 72 hours. CBG: Recent Labs  Lab 09/09/20 1604  GLUCAP 196*   Lipid Profile: No results for input(s): CHOL, HDL, LDLCALC, TRIG, CHOLHDL, LDLDIRECT in the last 72 hours. Thyroid Function Tests: No results for input(s): TSH, T4TOTAL, FREET4, T3FREE, THYROIDAB in the last 72 hours. Anemia Panel: No results for input(s): VITAMINB12, FOLATE, FERRITIN, TIBC, IRON, RETICCTPCT in the last 72 hours. Urine analysis: No results found for: COLORURINE, APPEARANCEUR, LABSPEC, PHURINE, GLUCOSEU, HGBUR, BILIRUBINUR, KETONESUR, PROTEINUR, UROBILINOGEN, NITRITE, LEUKOCYTESUR Sepsis Labs: @LABRCNTIP (procalcitonin:4,lacticidven:4) )No results found for this or any previous visit (from the past 240 hour(s)).   Radiological Exams on Admission: DG Chest Portable 1 View  Result Date: 09/09/2020 CLINICAL DATA:  Syncope, CHF EXAM: PORTABLE CHEST 1 VIEW COMPARISON:  Chest radiograph 07/06/2020 FINDINGS: The cardiomediastinal silhouette is stable. A cardiac loop recorder is again seen. There is a small to medium size right pleural effusion with adjacent airspace disease, similar to the prior study. The right upper lung is well aerated. The left lung is clear. There is no left effusion. There is no pneumothorax. The bones are stable. IMPRESSION: No significant interval change in size of the small to medium right pleural effusion with adjacent airspace disease since 07/06/2020. Electronically Signed   By: Valetta Mole M.D.   On: 09/09/2020 15:54    EKG: Independently reviewed.  Normal sinus rhythm with QTC of 459 ms.  Assessment/Plan Principal Problem:   GIB (gastrointestinal bleeding) Active Problems:   ESRD (end stage renal disease) on dialysis ([place])   Type II diabetes mellitus with renal manifestations ([facility])   Syncope   Acute GI bleeding    Syncope -patient was noticed to have high degree AV  block in the ER.  Appreciate cardiology consult.  Patient has an ILR which will be interrogated by cardiology and further recommendations per cardiology. GI bleed -has not had any further GI bleed today.  Had a normal bowel movement today.  We will check serial CBCs.  Has had extensive work-up in June where patient had EGD and colonoscopy.  Colonoscopy showed diverticulosis which likely was the source of bleed in June. CAD denies any chest pain.  Will restart Plavix if there is no  significant drop in hemoglobin over there is no recurrence of GI bleed. Diabetes mellitus type 2 we will closely follow with sliding scale coverage.  Usually takes 20 units of NovoLog 70/30. Anemia closely follow CBC to make sure there is no further drop. ESRD on hemodialysis on Tuesday Thursday Saturday.  Per nephrology.  COVID test is pending.   DVT prophylaxis: SCDs.  Avoiding anticoagulation in the setting of GI bleed. Code Status: Full code. Family Communication: Discussed with patient. Disposition Plan: Home. Consults called: Cardiology.  Will consult nephrology. Admission status: Observation.   Rise Patience MD [facility] Pager [phone].  If 7PM-7AM, please contact night-coverage [email or web address] Password [facility]  09/09/2020, 8:27 PM

## 2020-09-09 NOTE — Progress Notes (Addendum)
  Mr. Numa Schroeter is an 87 year old man with medical history significant for ESRD on hemodialysis on TTS schedule, chronic systolic CHF with recovered EF, type II DM, CAD s/p coronary stents in 2020, history of GI bleed and severe anemia requiring blood transfusion, chronic hypoxic respiratory failure on 2 L/min oxygen, presented to the emergency room after a syncopal episode at home.  Apparently, he had 2 episodes of rectal bleeding yesterday.  In the emergency room, he developed bradycardia with heart rate in the 40s.  Hemoglobin is 9.  Stool for occult blood was positive.  Reportedly, cardiologist, Dr. Irish Lack, was notified and he recommended that patient be admitted to the hospitalist service at [facility] for further management.  He will see the patient in consultation arrives at [facility].  Orders have been placed for patient to be admitted to progressive care unit. Case was discussed with Etta Quill, NP, who provided me with the history.  Additional information was also obtained from chart review. Please consult the cardiology team when the patient arrives.

## 2020-09-09 NOTE — ED Provider Notes (Signed)
[facility] EMERGENCY DEPARTMENT Provider Note   CSN: [id number] Arrival date & time: 09/09/20  1414     History Chief Complaint  Patient presents with   Rectal Bleeding   Loss of Consciousness    Nicholas [name] is a 86 y.o. male.  87 year old male presents to the ED with his wife. Patient with syncopal episode today about 45 mins prior to arrival. Syncopal episode occurred after patient returned from short walk (5 minutes) outside the house. History of diverticulitis, rectal bleeding yesterday. Prior MI. CKD, on dialysis.  The history is provided by the patient and the spouse. No language interpreter was used.  Rectal Bleeding Quality: dark. Amount:  Scant Timing:  Sporadic Chronicity:  Recurrent Similar prior episodes: yes   Associated symptoms: loss of consciousness   Associated symptoms: no abdominal pain and no fever   Loss of Consciousness Associated symptoms: no chest pain, no fever and no shortness of breath       Past Medical History:  Diagnosis Date   Arthritis    Chronic kidney disease    Diabetes mellitus ([place])    Dialysis patient ([place])    GERD (gastroesophageal reflux disease)    occ   Heart murmur    Hypercholesteremia    Hypertension    Psoriasis     Patient Active Problem List   Diagnosis Date Noted   Acute blood loss anemia 07/04/2020   Left renal mass 06/06/2020   Chest pain 06/05/2020   Hypotension    Syncope and collapse 03/17/2019   Acute anemia 09/28/2018   GIB (gastrointestinal bleeding) 09/27/2018   Anemia due to blood loss 09/27/2018   Type II diabetes mellitus with renal manifestations ([place]) 09/27/2018   GERD (gastroesophageal reflux disease) 09/27/2018   CAD (coronary artery disease) 69/48/5462   Chronic systolic CHF (congestive heart failure) ([place]) 09/27/2018   NSTEMI (non-ST elevated myocardial infarction) ([place]) 08/06/2018   Hypertension    Hypercholesteremia    ESRD (end stage renal disease) on dialysis ([place])     Anemia of chronic disease    Lung nodule seen on imaging study 04/30/2012    Past Surgical History:  Procedure Laterality Date   CARDIAC CATHETERIZATION  09   "some blockage" with collaterals by cath at [facility] ~ 2009; no intervention required; reportedly, no routine cardiology f/u recommended    COLONOSCOPY WITH PROPOFOL N/A 07/06/2020   Procedure: COLONOSCOPY WITH PROPOFOL;  Surgeon: Ronald Lobo, MD;  Location: [facility] ENDOSCOPY;  Service: Endoscopy;  Laterality: N/A;   CORONARY ATHERECTOMY N/A 08/09/2018   Procedure: CORONARY ATHERECTOMY;  Surgeon: Jettie Booze, MD;  Location: [facility] CV LAB;  Service: Cardiovascular;  Laterality: N/A;   CORONARY STENT INTERVENTION N/A 08/09/2018   Procedure: CORONARY STENT INTERVENTION;  Surgeon: Jettie Booze, MD;  Location: [place] CV LAB;  Service: Cardiovascular;  Laterality: N/A;   ESOPHAGOGASTRODUODENOSCOPY (EGD) WITH PROPOFOL N/A 07/06/2020   Procedure: ESOPHAGOGASTRODUODENOSCOPY (EGD) WITH PROPOFOL;  Surgeon: Ronald Lobo, MD;  Location: [facility];  Service: Endoscopy;  Laterality: N/A;   INGUINAL HERNIA REPAIR Right 04/13/2012   Procedure: HERNIA REPAIR INGUINAL INCARCERATED;  Surgeon: Gayland Curry, MD;  Location: [place];  Service: General;  Laterality: Right;   INSERTION OF MESH Right 04/13/2012   Procedure: INSERTION OF MESH;  Surgeon: Gayland Curry, MD;  Location: [facility];  Service: General;  Laterality: Right;   IR THORACENTESIS ASP PLEURAL SPACE W/IMG GUIDE  06/08/2020   LEFT HEART CATH N/A 08/09/2018   Procedure:  Left Heart Cath;  Surgeon: Jettie Booze, MD;  Location: [place] CV LAB;  Service: Cardiovascular;  Laterality: N/A;   LEFT HEART CATH AND CORONARY ANGIOGRAPHY N/A 08/08/2018   Procedure: LEFT HEART CATH AND CORONARY ANGIOGRAPHY;  Surgeon: Troy Sine, MD;  Location: [place] CV LAB;  Service: Cardiovascular;  Laterality: N/A;   LEFT HEART CATH AND CORONARY ANGIOGRAPHY N/A 06/05/2020   Procedure: LEFT  HEART CATH AND CORONARY ANGIOGRAPHY;  Surgeon: Belva Crome, MD;  Location: [facility] CV LAB;  Service: Cardiovascular;  Laterality: N/A;   VASECTOMY         Family History  Problem Relation Age of Onset   Diabetes Mother    Stroke Father    Diabetes Sister    Diabetes Brother     Social History   Tobacco Use   Smoking status: Former    Packs/day: 1.00    Years: 15.00    Pack years: 15.00    Types: Cigarettes    Quit date: 03/29/1987    Years since quitting: 33.4   Smokeless tobacco: Never  Substance Use Topics   Alcohol use: No   Drug use: No    Home Medications Prior to Admission medications   Medication Sig Start Date End Date Taking? Authorizing Provider  acetaminophen (TYLENOL) 500 MG tablet Take 500 mg by mouth 3 (three) times daily as needed for moderate pain.    [provider]  atorvastatin (LIPITOR) 80 MG tablet Take 80 mg by mouth at bedtime.    [provider]  cinacalcet (SENSIPAR) 30 MG tablet Take 30 mg by mouth in the morning, at noon, and at bedtime. 08/06/20   [provider]  clopidogrel (PLAVIX) 75 MG tablet Take 1 tablet (75 mg total) by mouth daily with breakfast. 07/14/20   Mercy Riding, MD  folic acid-vitamin b complex-vitamin c-selenium-zinc (DIALYVITE) 3 MG TABS tablet Take 1 tablet by mouth daily.    [provider]  insulin NPH-regular Human (70-30) 100 UNIT/ML injection Inject 20 Units into the skin 2 (two) times daily with a meal.    [provider]  isosorbide dinitrate (ISORDIL) 20 MG tablet Take 1 tablet (20 mg total) by mouth 2 (two) times daily. 06/08/20   Kathie Dike, MD  lidocaine-prilocaine (EMLA) cream Apply 1 application topically See admin instructions. Apply topically 30 minutes prior to dialysis on Tuesday, Thursday, Saturday 11/15/18   [provider]  methocarbamol (ROBAXIN) 500 MG tablet Take 1 tablet (500 mg total) by mouth every 8 (eight) hours as needed for muscle  spasms. We recommend you take yourself off this medications slowly 07/08/20   Mercy Riding, MD  Multiple Vitamin (MULTIVITAMIN WITH MINERALS) TABS tablet Take 1 tablet by mouth daily.    [provider]  nitroGLYCERIN (NITROSTAT) 0.4 MG SL tablet Place 1 tablet (0.4 mg total) under the tongue every 5 (five) minutes x 3 doses as needed for chest pain. 08/10/18   [name], MD  Nutritional Supplements (NEPRO/CARBSTEADY PO) Take 237 mLs by mouth daily. vanilla    [provider]  sevelamer carbonate (RENVELA) 800 MG tablet Take 1,600 mg by mouth 3 (three) times daily. 05/22/19   [provider]    Allergies    Hydrochlorothiazide  Review of Systems   Review of Systems  Constitutional:  Negative for fever.  Respiratory:  Negative for shortness of breath.   Cardiovascular:  Positive for syncope. Negative for chest pain.  Gastrointestinal:  Positive for  hematochezia. Negative for abdominal pain and rectal pain.  Neurological:  Positive for loss of consciousness.  All other systems reviewed and are negative.  Physical Exam Updated Vital Signs BP (!) 97/49 (BP Location: Right Arm)   Pulse 95   Temp 98.2 F (36.8 C) (Oral)   Resp 20   SpO2 100%   Physical Exam Constitutional:      Appearance: Normal appearance.  HENT:     Head: Normocephalic.     Mouth/Throat:     Mouth: Mucous membranes are moist.  Eyes:     Conjunctiva/sclera: Conjunctivae normal.  Cardiovascular:     Rate and Rhythm: Regular rhythm. Tachycardia present.  Pulmonary:     Effort: Pulmonary effort is normal.     Breath sounds: Normal breath sounds.  Abdominal:     General: There is no distension.     Palpations: Abdomen is soft.     Tenderness: There is no abdominal tenderness.  Musculoskeletal:        General: Normal range of motion.  Skin:    General: Skin is warm and dry.  Neurological:     Mental Status: He is alert and oriented to person, place, and time.   Psychiatric:        Mood and Affect: Mood normal.        Behavior: Behavior normal.    ED Results / Procedures / Treatments   Labs (all labs ordered are listed, but only abnormal results are displayed) Labs Reviewed  COMPREHENSIVE METABOLIC PANEL - Abnormal; Notable for the following components:      Result Value   Chloride 91 (*)    Glucose, Bld 201 (*)    BUN 25 (*)    Creatinine, Ser 5.79 (*)    Albumin 3.3 (*)    GFR, Estimated 9 (*)    Anion gap 17 (*)    All other components within normal limits  CBC WITH DIFFERENTIAL/PLATELET - Abnormal; Notable for the following components:   RBC 2.92 (*)    Hemoglobin 9.0 (*)    HCT 27.6 (*)    RDW 15.9 (*)    All other components within normal limits  OCCULT BLOOD X 1 CARD TO LAB, STOOL - Abnormal; Notable for the following components:   Fecal Occult Bld POSITIVE (*)    All other components within normal limits  CBG MONITORING, ED - Abnormal; Notable for the following components:   Glucose-Capillary 196 (*)    All other components within normal limits  SARS CORONAVIRUS 2 (TAT 6-24 HRS)  PROTIME-INR  MAGNESIUM  TSH  TROPONIN I (HIGH SENSITIVITY)  TROPONIN I (HIGH SENSITIVITY)    EKG EKG Interpretation  Date/Time:  Wednesday September 09 2020 16:24:39 EDT Ventricular Rate:  109 PR Interval:  193 QRS Duration: 103 QT Interval:  342 QTC Calculation: 461 R Axis:   -6 Text Interpretation: Sinus tachycardia Atrial premature complexes in couplets Borderline low voltage, extremity leads Consider anterior infarct Repol abnrm suggests ischemia, lateral leads Minimal ST elevation, inferior leads When comapared to prior,  faster rate and new t wave inveriosns in leads 1 and AVL. No STEMI Confirmed by Antony Blackbird [phone]) on 09/09/2020 4:34:57 PM  Radiology DG Chest Portable 1 View  Result Date: 09/09/2020 CLINICAL DATA:  Syncope, CHF EXAM: PORTABLE CHEST 1 VIEW COMPARISON:  Chest radiograph 07/06/2020 FINDINGS: The  cardiomediastinal silhouette is stable. A cardiac loop recorder is again seen. There is a small to medium size right pleural effusion with adjacent airspace disease, similar  to the prior study. The right upper lung is well aerated. The left lung is clear. There is no left effusion. There is no pneumothorax. The bones are stable. IMPRESSION: No significant interval change in size of the small to medium right pleural effusion with adjacent airspace disease since 07/06/2020. Electronically Signed   By: Valetta Mole M.D.   On: 09/09/2020 15:54    Procedures Procedures   Medications Ordered in ED Medications - No data to display  ED Course  I have reviewed the triage vital signs and the nursing notes.  Pertinent labs & imaging results that were available during my care of the patient were reviewed by me and considered in my medical decision making (see chart for details).  Patient dialyzed yesterday.  Review of chart indicate colonoscopy in June 2022 revealed colonic diverticulosis without bleeding.   Patient is hemocult positive today. Hemoglobin 9.0, HCT 27.6.   Patient experienced an episode of bradycardia while in the ED. Rate dropped into the 40's, appears to be complete block. Bradycardia resolved spontaneously. Patient has returned to baseline rhythm of sinus tachycardia. Syncopal episode today may be cardiac in etiology. Patient has a loop recorder.  Patient discussed with and seen by Dr. Sherry Ruffing.  Patient discussed with cardiology Irish Lack). Recommends admission to hospitalist. Cardiology will see in consult.  Discussed with hospitalist Mal Misty), patient accepted for admission to progressive unit at [facility].     MDM Rules/Calculators/A&P                            Final Clinical Impression(s) / ED Diagnoses Final diagnoses:  Syncope and collapse    Rx / DC Orders ED Discharge Orders     None        Etta Quill, NP 09/09/20 1934    Tegeler, Gwenyth Allegra,  MD 09/10/20 1524

## 2020-09-09 NOTE — ED Triage Notes (Signed)
Pt c/o rectal bleeding x 2 episodes yesterday-wife states he passed out ~45 min PTA-pt to triage in w/c-alert/NAD

## 2020-09-10 DIAGNOSIS — D649 Anemia, unspecified: Secondary | ICD-10-CM | POA: Diagnosis not present

## 2020-09-10 DIAGNOSIS — Z992 Dependence on renal dialysis: Secondary | ICD-10-CM | POA: Diagnosis not present

## 2020-09-10 DIAGNOSIS — I2510 Atherosclerotic heart disease of native coronary artery without angina pectoris: Secondary | ICD-10-CM | POA: Diagnosis not present

## 2020-09-10 DIAGNOSIS — R55 Syncope and collapse: Secondary | ICD-10-CM | POA: Diagnosis not present

## 2020-09-10 DIAGNOSIS — R001 Bradycardia, unspecified: Secondary | ICD-10-CM | POA: Diagnosis not present

## 2020-09-10 DIAGNOSIS — K922 Gastrointestinal hemorrhage, unspecified: Secondary | ICD-10-CM | POA: Diagnosis not present

## 2020-09-10 DIAGNOSIS — N186 End stage renal disease: Secondary | ICD-10-CM | POA: Diagnosis not present

## 2020-09-10 DIAGNOSIS — I1311 Hypertensive heart and chronic kidney disease without heart failure, with stage 5 chronic kidney disease, or end stage renal disease: Secondary | ICD-10-CM | POA: Diagnosis not present

## 2020-09-10 DIAGNOSIS — N250 Renal osteodystrophy: Secondary | ICD-10-CM | POA: Diagnosis not present

## 2020-09-10 LAB — COMPREHENSIVE METABOLIC PANEL
ALT: 17 U/L (ref 0–44)
AST: 15 U/L (ref 15–41)
Albumin: 2.5 g/dL — ABNORMAL LOW (ref 3.5–5.0)
Alkaline Phosphatase: 63 U/L (ref 38–126)
Anion gap: 15 (ref 5–15)
BUN: 27 mg/dL — ABNORMAL HIGH (ref 8–23)
CO2: 30 mmol/L (ref 22–32)
Calcium: 9.1 mg/dL (ref 8.9–10.3)
Chloride: 93 mmol/L — ABNORMAL LOW (ref 98–111)
Creatinine, Ser: 6.88 mg/dL — ABNORMAL HIGH (ref 0.61–1.24)
GFR, Estimated: 7 mL/min — ABNORMAL LOW (ref >60)
Glucose, Bld: 137 mg/dL — ABNORMAL HIGH (ref 70–99)
Potassium: 4.7 mmol/L (ref 3.5–5.1)
Sodium: 138 mmol/L (ref 135–145)
Total Bilirubin: 0.4 mg/dL (ref 0.3–1.2)
Total Protein: 5.7 g/dL — ABNORMAL LOW (ref 6.5–8.1)

## 2020-09-10 LAB — RETICULOCYTES
Immature Retic Fract: 16.6 % — ABNORMAL HIGH (ref 2.3–15.9)
RBC.: 2.69 MIL/uL — ABNORMAL LOW (ref 4.22–5.81)
Retic Count, Absolute: 28.8 10*3/uL (ref 19.0–186.0)
Retic Ct Pct: 1.1 % (ref 0.4–3.1)

## 2020-09-10 LAB — MAGNESIUM: Magnesium: 2 mg/dL (ref 1.7–2.4)

## 2020-09-10 LAB — IRON AND TIBC
Iron: 58 ug/dL (ref 45–182)
Saturation Ratios: 26 % (ref 17.9–39.5)
TIBC: 224 ug/dL — ABNORMAL LOW (ref 250–450)
UIBC: 166 ug/dL

## 2020-09-10 LAB — TSH: TSH: 4.184 u[IU]/mL (ref 0.350–4.500)

## 2020-09-10 LAB — CBC
HCT: 25.8 % — ABNORMAL LOW (ref 39.0–52.0)
HCT: 22.6 % — ABNORMAL LOW (ref 39.0–52.0)
HCT: 26.1 % — ABNORMAL LOW (ref 39.0–52.0)
Hemoglobin: 8.5 g/dL — ABNORMAL LOW (ref 13.0–17.0)
Hemoglobin: 7.3 g/dL — ABNORMAL LOW (ref 13.0–17.0)
Hemoglobin: 8.5 g/dL — ABNORMAL LOW (ref 13.0–17.0)
MCH: 30.8 pg (ref 26.0–34.0)
MCH: 30.3 pg (ref 26.0–34.0)
MCH: 31 pg (ref 26.0–34.0)
MCHC: 32.9 g/dL (ref 30.0–36.0)
MCHC: 32.3 g/dL (ref 30.0–36.0)
MCHC: 32.6 g/dL (ref 30.0–36.0)
MCV: 93.5 fL (ref 80.0–100.0)
MCV: 93.8 fL (ref 80.0–100.0)
MCV: 95.3 fL (ref 80.0–100.0)
Platelets: 225 10*3/uL (ref 150–400)
Platelets: 221 10*3/uL (ref 150–400)
Platelets: 254 10*3/uL (ref 150–400)
RBC: 2.76 MIL/uL — ABNORMAL LOW (ref 4.22–5.81)
RBC: 2.41 MIL/uL — ABNORMAL LOW (ref 4.22–5.81)
RBC: 2.74 MIL/uL — ABNORMAL LOW (ref 4.22–5.81)
RDW: 15.9 % — ABNORMAL HIGH (ref 11.5–15.5)
RDW: 15.8 % — ABNORMAL HIGH (ref 11.5–15.5)
RDW: 15.9 % — ABNORMAL HIGH (ref 11.5–15.5)
WBC: 7.1 10*3/uL (ref 4.0–10.5)
WBC: 7.7 10*3/uL (ref 4.0–10.5)
WBC: 8.7 10*3/uL (ref 4.0–10.5)
nRBC: 0 % (ref 0.0–0.2)
nRBC: 0 % (ref 0.0–0.2)
nRBC: 0 % (ref 0.0–0.2)

## 2020-09-10 LAB — VITAMIN B12: Vitamin B-12: 342 pg/mL (ref 180–914)

## 2020-09-10 LAB — GLUCOSE, CAPILLARY
Glucose-Capillary: 125 mg/dL — ABNORMAL HIGH (ref 70–99)
Glucose-Capillary: 135 mg/dL — ABNORMAL HIGH (ref 70–99)
Glucose-Capillary: 202 mg/dL — ABNORMAL HIGH (ref 70–99)
Glucose-Capillary: 214 mg/dL — ABNORMAL HIGH (ref 70–99)
Glucose-Capillary: 86 mg/dL (ref 70–99)

## 2020-09-10 LAB — FOLATE: Folate: 8.3 ng/mL (ref >5.9)

## 2020-09-10 LAB — TROPONIN I (HIGH SENSITIVITY): Troponin I (High Sensitivity): 23 ng/L — ABNORMAL HIGH (ref <18)

## 2020-09-10 LAB — SARS CORONAVIRUS 2 (TAT 6-24 HRS): SARS Coronavirus 2: NEGATIVE

## 2020-09-10 LAB — FERRITIN: Ferritin: 1653 ng/mL — ABNORMAL HIGH (ref 24–336)

## 2020-09-10 MED ORDER — ISOSORBIDE DINITRATE 20 MG PO TABS
20.0000 mg | ORAL_TABLET | Freq: Two times a day (BID) | ORAL | Status: DC
  Administered 2020-09-10 – 2020-09-13 (×5): 20 mg via ORAL
  Filled 2020-09-10: qty 1
  Filled 2020-09-10: qty 2
  Filled 2020-09-10 (×2): qty 1
  Filled 2020-09-10: qty 2
  Filled 2020-09-10: qty 1
  Filled 2020-09-10: qty 2
  Filled 2020-09-10: qty 1
  Filled 2020-09-10: qty 2
  Filled 2020-09-10: qty 1
  Filled 2020-09-10: qty 2

## 2020-09-10 MED ORDER — SODIUM CHLORIDE 0.9 % IV BOLUS
250.0000 mL | Freq: Once | INTRAVENOUS | Status: AC
  Administered 2020-09-10: 250 mL via INTRAVENOUS

## 2020-09-10 MED ORDER — ONDANSETRON HCL 4 MG PO TABS: 4.0000 mg | ORAL_TABLET | Freq: Four times a day (QID) | ORAL | Status: DC | PRN

## 2020-09-10 MED ORDER — DOCUSATE SODIUM 283 MG RE ENEM
1.0000 | ENEMA | RECTAL | Status: DC | PRN
  Filled 2020-09-10: qty 1

## 2020-09-10 MED ORDER — ACETAMINOPHEN 325 MG PO TABS: 650.0000 mg | ORAL_TABLET | Freq: Four times a day (QID) | ORAL | Status: DC | PRN

## 2020-09-10 MED ORDER — CAMPHOR-MENTHOL 0.5-0.5 % EX LOTN
1.0000 "application " | TOPICAL_LOTION | Freq: Three times a day (TID) | CUTANEOUS | Status: DC | PRN
  Filled 2020-09-10: qty 222

## 2020-09-10 MED ORDER — NITROGLYCERIN 0.4 MG SL SUBL
0.4000 mg | SUBLINGUAL_TABLET | SUBLINGUAL | Status: AC | PRN
Start: 2020-09-10 — End: 2020-09-10
  Administered 2020-09-10 (×2): 0.4 mg via SUBLINGUAL
  Filled 2020-09-10: qty 1

## 2020-09-10 MED ORDER — SORBITOL 70 % SOLN: 30.0000 mL | Status: DC | PRN

## 2020-09-10 MED ORDER — ACETAMINOPHEN 650 MG RE SUPP: 650.0000 mg | Freq: Four times a day (QID) | RECTAL | Status: DC | PRN

## 2020-09-10 MED ORDER — ONDANSETRON HCL 4 MG/2ML IJ SOLN: 4.0000 mg | Freq: Four times a day (QID) | INTRAMUSCULAR | Status: DC | PRN

## 2020-09-10 MED ORDER — NEPRO/CARBSTEADY PO LIQD: 237.0000 mL | Freq: Three times a day (TID) | ORAL | Status: DC | PRN

## 2020-09-10 MED ORDER — ZOLPIDEM TARTRATE 5 MG PO TABS: 5.0000 mg | ORAL_TABLET | Freq: Every evening | ORAL | Status: DC | PRN

## 2020-09-10 MED ORDER — HYDROXYZINE HCL 25 MG PO TABS: 25.0000 mg | ORAL_TABLET | Freq: Three times a day (TID) | ORAL | Status: DC | PRN

## 2020-09-10 MED ORDER — PANTOPRAZOLE SODIUM 40 MG PO TBEC
40.0000 mg | DELAYED_RELEASE_TABLET | Freq: Every day | ORAL | Status: DC
  Administered 2020-09-10 – 2020-09-12 (×3): 40 mg via ORAL
  Filled 2020-09-10 (×4): qty 1

## 2020-09-10 MED ORDER — CHLORHEXIDINE GLUCONATE CLOTH 2 % EX PADS
6.0000 | MEDICATED_PAD | Freq: Every day | CUTANEOUS | Status: DC
  Administered 2020-09-10 – 2020-09-13 (×4): 6 via TOPICAL

## 2020-09-10 MED ORDER — DARBEPOETIN ALFA 60 MCG/0.3ML IJ SOSY: 60.0000 ug | PREFILLED_SYRINGE | INTRAMUSCULAR | Status: DC

## 2020-09-10 MED ORDER — CALCIUM CARBONATE ANTACID 1250 MG/5ML PO SUSP
500.0000 mg | Freq: Four times a day (QID) | ORAL | Status: DC | PRN
  Filled 2020-09-10: qty 5

## 2020-09-10 NOTE — Progress Notes (Signed)
PROGRESS NOTE    Nicholas [name]  NFA:[id number] DOB: 1930-03-23 DOA: 09/09/2020 PCP: Pcp, No  Brief Narrative: 87 year old male with history of CAD, PCI/stenting in 07/2018, ESRD on hemodialysis, type 2 diabetes mellitus was brought to the ED following a syncopal event. -On 8/16 patient had an episode of moderate hematochezia followed by another stool with scant blood.  On 8/17 he had normal BMs, went for hemodialysis, was back home went for a walk, and after returning to the house he had a syncopal event.  Was taken to [place] where he had an episode of bradycardia with heart rates dropping to the 40s and had a run of high-grade AV block.  Cardiology was consulted and he was transferred to [place]:   Syncope Bradycardia, high-grade AV block -Cardiology consulting, he is not on any AV blockers -Plan for interrogation of loop recorder -Monitor on telemetry, check mag level  ESRD on hemodialysis -Due for HD today, nephrology consulted  Hematochezia on 8/16 -Resolved, no further episodes -Hospitalized in June for same, EGD was unremarkable, colonoscopy noted diverticulosis which is likely the source again -Hold Plavix, continue to trend hemoglobin, check anemia panel -May need a transfusion  Acute blood loss anemia  Anemia of chronic disease -No further hematochezia since 8/16, as above -Continue EPO with HD  Type 2 diabetes mellitus -CBGs are stable, monitor -Insulin 70/30 on hold  DVT prophylaxis: SCDs  code Status: Full code Family Communication: No family at bedside Disposition Plan:  Status is: Observation  The patient will require care spanning > 2 midnights and should be moved to inpatient because: Inpatient level of care appropriate due to severity of illness  Dispo: The patient is from: Home              Anticipated d/c is to: Home              Patient currently is not medically stable to d/c.   Difficult to place patient No         Consultants:  Cardiology Nephrology  Procedures:   Antimicrobials:    Subjective: -Feels okay today, denies any complaints  Objective: Vitals:   09/10/20 0500 09/10/20 0520 09/10/20 0637 09/10/20 0730  BP: (!) 92/47 (!) 88/57 (!) 116/51 106/65  Pulse:  71 70 78  Resp:  (!) 23 (!) 21 20  Temp:    97.7 F (36.5 C)  TempSrc:    Oral  SpO2:  96%  96%  Weight:      Height:        Intake/Output Summary (Last 24 hours) at 09/10/2020 1012 Last data filed at 09/10/2020 0800 Gross per 24 hour  Intake 689.63 ml  Output --  Net 689.63 ml   Filed Weights   09/09/20 1945 09/10/20 0401  Weight: 68.7 kg 67.9 kg    Examination:  General exam: Appears calm and comfortable  Respiratory system: Clear to auscultation Cardiovascular system: S1 & S2 heard, RRR.  Abd: nondistended, soft and nontender.Normal bowel sounds heard. Central nervous system: Alert and oriented. No focal neurological deficits. Extremities: no edema Skin: No rashes Psychiatry: Judgement and insight appear normal. Mood & affect appropriate.     Data Reviewed:   CBC: Recent Labs  Lab 09/09/20 1551 09/09/20 2025 09/10/20 0049 09/10/20 0439  WBC 9.3 7.7 7.1 7.7  NEUTROABS 7.4  --   --   --   HGB 9.0* 8.2* 8.5* 7.3*  HCT 27.6* 26.0* 25.8* 22.6*  MCV  94.5 95.9 93.5 93.8  PLT 245 251 225 166   Basic Metabolic Panel: Recent Labs  Lab 09/09/20 1551 09/10/20 0439  NA 135 138  K 4.4 4.7  CL 91* 93*  CO2 27 30  GLUCOSE 201* 137*  BUN 25* 27*  CREATININE 5.79* 6.88*  CALCIUM 8.9 9.1  MG 2.2  --    GFR: Estimated Creatinine Clearance: 7.1 mL/min (A) (by C-G formula based on SCr of 6.88 mg/dL (H)). Liver Function Tests: Recent Labs  Lab 09/09/20 1551 09/10/20 0439  AST 18 15  ALT 22 17  ALKPHOS 85 63  BILITOT 0.3 0.4  PROT 7.7 5.7*  ALBUMIN 3.3* 2.5*   No results for input(s): LIPASE, AMYLASE in the last 168 hours. No results for input(s): AMMONIA in the last 168 hours. Coagulation  Profile: Recent Labs  Lab 09/09/20 1551  INR 1.0   Cardiac Enzymes: No results for input(s): CKTOTAL, CKMB, CKMBINDEX, TROPONINI in the last 168 hours. BNP (last 3 results) No results for input(s): PROBNP in the last 8760 hours. HbA1C: No results for input(s): HGBA1C in the last 72 hours. CBG: Recent Labs  Lab 09/09/20 1604 09/10/20 0001 09/10/20 0608  GLUCAP 196* 135* 125*   Lipid Profile: No results for input(s): CHOL, HDL, LDLCALC, TRIG, CHOLHDL, LDLDIRECT in the last 72 hours. Thyroid Function Tests: Recent Labs    09/09/20 2110  TSH 4.302   Anemia Panel: No results for input(s): VITAMINB12, FOLATE, FERRITIN, TIBC, IRON, RETICCTPCT in the last 72 hours. Urine analysis: No results found for: COLORURINE, APPEARANCEUR, LABSPEC, PHURINE, GLUCOSEU, HGBUR, BILIRUBINUR, KETONESUR, PROTEINUR, UROBILINOGEN, NITRITE, LEUKOCYTESUR Sepsis Labs: @LABRCNTIP (procalcitonin:4,lacticidven:4)  ) Recent Results (from the past 240 hour(s))  SARS CORONAVIRUS 2 (TAT 6-24 HRS) Nasopharyngeal Nasopharyngeal Swab     Status: None   Collection Time: 09/09/20  5:27 PM   Specimen: Nasopharyngeal Swab  Result Value Ref Range Status   SARS Coronavirus 2 NEGATIVE NEGATIVE Final    Comment: (NOTE) SARS-CoV-2 target nucleic acids are NOT DETECTED.  The SARS-CoV-2 RNA is generally detectable in upper and lower respiratory specimens during the acute phase of infection. Negative results do not preclude SARS-CoV-2 infection, do not rule out co-infections with other pathogens, and should not be used as the sole basis for treatment or other patient management decisions. Negative results must be combined with clinical observations, patient history, and epidemiological information. The expected result is Negative.  Fact Sheet for Patients: SugarRoll.be  Fact Sheet for Healthcare Providers: https://www.woods-mathews.com/  This test is not yet approved or  cleared by the [place] FDA and  has been authorized for detection and/or diagnosis of SARS-CoV-2 by FDA under an Emergency Use Authorization (EUA). This EUA will remain  in effect (meaning this test can be used) for the duration of the COVID-19 declaration under Se ction 564(b)(1) of the Act, 21 U.S.C. section 360bbb-3(b)(1), unless the authorization is terminated or revoked sooner.  Performed at [facility], [place] [address] [place], [place] [address]   MRSA Next Gen by PCR, Nasal     Status: None   Collection Time: 09/09/20  7:55 PM   Specimen: Nasal Mucosa; Nasal Swab  Result Value Ref Range Status   MRSA by PCR Next Gen NOT DETECTED NOT DETECTED Final    Comment: (NOTE) The GeneXpert MRSA Assay (FDA approved for NASAL specimens only), is one component of a comprehensive MRSA colonization surveillance program. It is not intended to diagnose MRSA infection nor to guide or monitor treatment for MRSA  infections. Test performance is not FDA approved in patients less than 51 years old. Performed at [facility], [place] [address] [place], [place] [address]      Radiology Studies: DG Chest Portable 1 View  Result Date: 09/09/2020 CLINICAL DATA:  Syncope, CHF EXAM: PORTABLE CHEST 1 VIEW COMPARISON:  Chest radiograph 07/06/2020 FINDINGS: The cardiomediastinal silhouette is stable. A cardiac loop recorder is again seen. There is a small to medium size right pleural effusion with adjacent airspace disease, similar to the prior study. The right upper lung is well aerated. The left lung is clear. There is no left effusion. There is no pneumothorax. The bones are stable. IMPRESSION: No significant interval change in size of the small to medium right pleural effusion with adjacent airspace disease since 07/06/2020. Electronically Signed   By: Valetta Mole M.D.   On: 09/09/2020 15:54        Scheduled Meds:  atorvastatin  80 mg Oral QHS   insulin aspart  0-6 Units  Subcutaneous TID WC   multivitamin  1 tablet Oral QHS   pantoprazole (PROTONIX) IV  40 mg Intravenous Q12H   sevelamer carbonate  1,600 mg Oral TID with meals   Continuous Infusions:   LOS: 0 days    Time spent: 75min  Domenic Polite, MD [facility]   09/10/2020, 10:12 AM

## 2020-09-10 NOTE — Consult Note (Signed)
[facility] Renal Consultation Note    Indication for Consultation:  Management of ESRD/hemodialysis; anemia, hypertension/volume and secondary hyperparathyroidism   HPI: Nicholas [name] is a 87 y.o. male with ESRD on HD, DM, HTN, CAD s/p stent who is admitted under observation status for syncope and rectal bleeding. Had an episode of syncope at home yesterday. On arrival to ED became bradycardic with HR to 40s. Cardiology and EP consulted -has ILR in place.  Labs this am Na 138, K 4.7, Ca 9.1, Hgb 7.3. FOBT+   Asked to see for dialysis needs. Dialysis at [place]. He has been compliant with treatments. Denies any syncopal episodes at dialysis and has been doing well with treatments. His blood pressure does "run low". He is on chronic 2L O2 at home.  Denies dizziness, cp, sob, n/v, edema.  Past Medical History:  Diagnosis Date   Arthritis    Chronic kidney disease    Diabetes mellitus ([place])    Dialysis patient ([place])    GERD (gastroesophageal reflux disease)    occ   Heart murmur    Hypercholesteremia    Hypertension    Psoriasis    Past Surgical History:  Procedure Laterality Date   CARDIAC CATHETERIZATION  09   "some blockage" with collaterals by cath at [facility] ~ 2009; no intervention required; reportedly, no routine cardiology f/u recommended    COLONOSCOPY WITH PROPOFOL N/A 07/06/2020   Procedure: COLONOSCOPY WITH PROPOFOL;  Surgeon: Ronald Lobo, MD;  Location: [place];  Service: Endoscopy;  Laterality: N/A;   CORONARY ATHERECTOMY N/A 08/09/2018   Procedure: CORONARY ATHERECTOMY;  Surgeon: Jettie Booze, MD;  Location: [place] [facility];  Service: Cardiovascular;  Laterality: N/A;   CORONARY STENT INTERVENTION N/A 08/09/2018   Procedure: CORONARY STENT INTERVENTION;  Surgeon: Jettie Booze, MD;  Location: [place] [facility];  Service: Cardiovascular;  Laterality: N/A;   ESOPHAGOGASTRODUODENOSCOPY (EGD) WITH PROPOFOL N/A 07/06/2020    Procedure: ESOPHAGOGASTRODUODENOSCOPY (EGD) WITH PROPOFOL;  Surgeon: Ronald Lobo, MD;  Location: [place];  Service: Endoscopy;  Laterality: N/A;   INGUINAL HERNIA REPAIR Right 04/13/2012   Procedure: HERNIA REPAIR INGUINAL INCARCERATED;  Surgeon: Gayland Curry, MD;  Location: [place];  Service: General;  Laterality: Right;   INSERTION OF MESH Right 04/13/2012   Procedure: INSERTION OF MESH;  Surgeon: Gayland Curry, MD;  Location: [place];  Service: General;  Laterality: Right;   IR THORACENTESIS ASP PLEURAL SPACE W/IMG GUIDE  06/08/2020   LEFT HEART CATH N/A 08/09/2018   Procedure: Left Heart Cath;  Surgeon: Jettie Booze, MD;  Location: [place] [facility];  Service: Cardiovascular;  Laterality: N/A;   LEFT HEART CATH AND CORONARY ANGIOGRAPHY N/A 08/08/2018   Procedure: LEFT HEART CATH AND CORONARY ANGIOGRAPHY;  Surgeon: Troy Sine, MD;  Location: [place] [facility];  Service: Cardiovascular;  Laterality: N/A;   LEFT HEART CATH AND CORONARY ANGIOGRAPHY N/A 06/05/2020   Procedure: LEFT HEART CATH AND CORONARY ANGIOGRAPHY;  Surgeon: Belva Crome, MD;  Location: [place] [facility];  Service: Cardiovascular;  Laterality: N/A;   VASECTOMY     Family History  Problem Relation Age of Onset   Diabetes Mother    Stroke Father    Diabetes Sister    Diabetes Brother    Social History:  reports that he quit smoking about 33 years ago. His smoking use included cigarettes. He has a 15.00 pack-year smoking history. He has never used smokeless tobacco. He reports that  he does not drink alcohol and does not use drugs. Allergies  Allergen Reactions   Hydrochlorothiazide Other (See Comments)    [facility]   Prior to Admission medications   Medication Sig Start Date End Date Taking? Authorizing Provider  acetaminophen (TYLENOL) 500 MG tablet Take 500 mg by mouth 3 (three) times daily as needed for moderate pain.   Yes [provider]  atorvastatin (LIPITOR) 80 MG tablet  Take 80 mg by mouth at bedtime.   Yes [provider]  clopidogrel (PLAVIX) 75 MG tablet Take 1 tablet (75 mg total) by mouth daily with breakfast. 07/14/20  Yes Gonfa, Charlesetta Ivory, MD  Fluticasone Propionate,sensor, (ARMONAIR DIGIHALER) 232 MCG/ACT AEPB Inhale 1 puff into the lungs 2 (two) times daily.   Yes [provider]  insulin NPH-regular Human (70-30) 100 UNIT/ML injection Inject 20 Units into the skin at bedtime. Takes 20 units in the morning if blood sugar is over 140   Yes [provider]  isosorbide dinitrate (ISORDIL) 20 MG tablet Take 1 tablet (20 mg total) by mouth 2 (two) times daily. 06/08/20  Yes Kathie Dike, MD  Multiple Vitamin (MULTIVITAMIN WITH MINERALS) TABS tablet Take 1 tablet by mouth daily.   Yes [provider]  nitroGLYCERIN (NITROSTAT) 0.4 MG SL tablet Place 1 tablet (0.4 mg total) under the tongue every 5 (five) minutes x 3 doses as needed for chest pain. 08/10/18  Yes [name], MD  Nutritional Supplements (NEPRO/CARBSTEADY PO) Take 237 mLs by mouth daily. vanilla   Yes [provider]  sevelamer carbonate (RENVELA) 800 MG tablet Take 1,600 mg by mouth 3 (three) times daily. 05/22/19  Yes [provider]  methocarbamol (ROBAXIN) 500 MG tablet Take 1 tablet (500 mg total) by mouth every 8 (eight) hours as needed for muscle spasms. We recommend you take yourself off this medications slowly Patient not taking: Reported on 09/09/2020 07/08/20   Mercy Riding, MD   Current Facility-Administered Medications  Medication Dose Route Frequency Provider Last Rate Last Admin   acetaminophen (TYLENOL) tablet 650 mg  650 mg Oral Q6H PRN Rise Patience, MD   650 mg at 09/10/20 0800   Or   acetaminophen (TYLENOL) suppository 650 mg  650 mg Rectal Q6H PRN Rise Patience, MD       atorvastatin (LIPITOR) tablet 80 mg  80 mg Oral QHS Rise Patience, MD   80 mg at 09/09/20 2233   calcium carbonate (dosed in mg  elemental calcium) suspension 500 mg of elemental calcium  500 mg of elemental calcium Oral Q6H PRN Lynnda Child, PA-C       camphor-menthol [facility]) lotion 1 application  1 application Topical Y3K PRN Lynnda Child, PA-C       And   hydrOXYzine (ATARAX/VISTARIL) tablet 25 mg  25 mg Oral Q8H PRN Lynnda Child, PA-C       Chlorhexidine Gluconate Cloth 2 % PADS 6 each  6 each Topical Q0600 Lynnda Child, PA-C       docusate sodium (ENEMEEZ) enema 283 mg  1 enema Rectal PRN Lynnda Child, PA-C       feeding supplement (NEPRO CARB STEADY) liquid 237 mL  237 mL Oral TID PRN Lynnda Child, PA-C       insulin aspart (novoLOG) injection 0-6 Units  0-6 Units Subcutaneous TID WC Rise Patience, MD       multivitamin (RENA-VIT) tablet 1 tablet  1 tablet  Oral QHS [place] Jude, [place]   1 tablet at 09/09/20 2233   nitroGLYCERIN (NITROSTAT) SL tablet 0.4 mg  0.4 mg Sublingual Q5 min PRN Domenic Polite, MD   0.4 mg at 09/10/20 0800   ondansetron (ZOFRAN) tablet 4 mg  4 mg Oral Q6H PRN Lynnda Child, PA-C       Or   ondansetron [facility]) injection 4 mg  4 mg Intravenous Q6H PRN Lynnda Child, PA-C       pantoprazole (PROTONIX) EC tablet 40 mg  40 mg Oral Q1200 Domenic Polite, MD       sevelamer carbonate (RENVELA) tablet 1,600 mg  1,600 mg Oral TID with meals Rise Patience, MD       sorbitol 70 % solution 30 mL  30 mL Oral PRN Lynnda Child, PA-C       zolpidem (AMBIEN) tablet 5 mg  5 mg Oral QHS PRN Lynnda Child, PA-C         ROS: As per HPI otherwise negative.  Physical Exam: Vitals:   09/10/20 0520 09/10/20 0637 09/10/20 0730 09/10/20 1100  BP: (!) 88/57 (!) 116/51 106/65 (!) 103/56  Pulse: 71 70 78 87  Resp: (!) 23 (!) 21 20 17   Temp:   97.7 F (36.5 C) (!) 97.5 F (36.4 C)  TempSrc:   Oral Oral  SpO2: 96%  96% 100%  Weight:      Height:         General: Well appearing, on nasal oxygen, nad  Head:  NCAT sclera not icteric MMM Neck: Supple. No JVD appreciated  Lungs: CTA bilaterally without wheezes, rales, or rhonchi. Breathing is unlabored. Heart: RRR with S1 S2 Abdomen: soft NT + BS Lower extremities:without edema or ischemic changes, no open wounds  Neuro: A & O  X 3. Moves all extremities spontaneously. Psych:  Responds to questions appropriately with a normal affect. Dialysis Access: LUE AVF +bruit   Labs: Basic Metabolic Panel: Recent Labs  Lab 09/09/20 1551 09/10/20 0439  NA 135 138  K 4.4 4.7  CL 91* 93*  CO2 27 30  GLUCOSE 201* 137*  BUN 25* 27*  CREATININE 5.79* 6.88*  CALCIUM 8.9 9.1   Liver Function Tests: Recent Labs  Lab 09/09/20 1551 09/10/20 0439  AST 18 15  ALT 22 17  ALKPHOS 85 63  BILITOT 0.3 0.4  PROT 7.7 5.7*  ALBUMIN 3.3* 2.5*   No results for input(s): LIPASE, AMYLASE in the last 168 hours. No results for input(s): AMMONIA in the last 168 hours. CBC: Recent Labs  Lab 09/09/20 1551 09/09/20 2025 09/10/20 0049 09/10/20 0439  WBC 9.3 7.7 7.1 7.7  NEUTROABS 7.4  --   --   --   HGB 9.0* 8.2* 8.5* 7.3*  HCT 27.6* 26.0* 25.8* 22.6*  MCV 94.5 95.9 93.5 93.8  PLT 245 251 225 221   Cardiac Enzymes: No results for input(s): CKTOTAL, CKMB, CKMBINDEX, TROPONINI in the last 168 hours. CBG: Recent Labs  Lab 09/09/20 1604 09/10/20 0001 09/10/20 0608 09/10/20 1129  GLUCAP 196* 135* 125* 202*   Iron Studies: No results for input(s): IRON, TIBC, TRANSFERRIN, FERRITIN in the last 72 hours. Studies/Results: DG Chest Portable 1 View  Result Date: 09/09/2020 CLINICAL DATA:  Syncope, CHF EXAM: PORTABLE CHEST 1 VIEW COMPARISON:  Chest radiograph 07/06/2020 FINDINGS: The cardiomediastinal silhouette is stable. A cardiac loop recorder is again seen. There is a small to medium size right pleural effusion with adjacent airspace disease,  similar to the prior study. The right upper lung is well aerated. The left lung is clear. There is no left  effusion. There is no pneumothorax. The bones are stable. IMPRESSION: No significant interval change in size of the small to medium right pleural effusion with adjacent airspace disease since 07/06/2020. Electronically Signed   By: Valetta Mole M.D.   On: 09/09/2020 15:54    Dialysis Orders:  [facility] 3.5h 69kg 3K/2.5Ca  Hectorol 41mcg TIW No ESA    Assessment/Plan: Syncope- bradycardia +HB on arrival. Cardiology/EP following -may need pacing  ESRD -  HD TTS. Continue on schedule. HD today  Hypertension/volume  - BP/volume ok. No BP meds. Gentle UF on HD  Anemia/Rectal bleeding - +FOBT. Hgb 7.3. Not on ESA as outpatient -will order here. Follow trends  Metabolic bone disease -  Ca ok. Continue home binders/VDRA. Follow trends here.  Nutrition - Renal diet w fluid restriction   Lynnda Child PA-C [facility] 09/10/2020, 11:49 AM

## 2020-09-10 NOTE — Progress Notes (Signed)
   09/10/20 0401  Assess: MEWS Score  Temp 98.3 F (36.8 C)  BP (!) 96/53  Pulse Rate 86  ECG Heart Rate 86  Resp (!) 23  Assess: MEWS Score  MEWS Temp 0  MEWS Systolic 1  MEWS Pulse 0  MEWS RR 1  MEWS LOC 0  MEWS Score 2  MEWS Score Color Yellow  Assess: if the MEWS score is Yellow or Red  Were vital signs taken at a resting state? Yes  Focused Assessment No change from prior assessment  Early Detection of Sepsis Score *See Row Information* Low  MEWS guidelines implemented *See Row Information* Yes  Treat  MEWS Interventions Escalated (See documentation below)  Pain Scale 0-10  Pain Score 0  Take Vital Signs  Increase Vital Sign Frequency  Yellow: Q 2hr X 2 then Q 4hr X 2, if remains yellow, continue Q 4hrs  Escalate  MEWS: Escalate Yellow: discuss with charge nurse/RN and consider discussing with provider and RRT  Notify: Charge Nurse/RN  Name of Charge Nurse/RN Notified Tanya, RN  Date Charge Nurse/RN Notified 09/10/20  Time Charge Nurse/RN Notified 0401  Document  Patient Outcome Other (Comment) (stable)  Progress note created (see row info) Yes

## 2020-09-10 NOTE — Consult Note (Addendum)
Cardiology Consultation:   Patient ID: Nicholas [name] MRN: [id number]; DOB: 1930-08-25  Admit date: 09/09/2020 Date of Consult: 09/10/2020  PCP:  Merryl Hacker No   [facility] Providers Cardiologist:  Sinclair Grooms, MD EP: Dr. Caryl Comes   Patient Profile:   Nicholas [name] is a 87 y.o. male with a hx of ESRF on HD, CAD (s/p STEMI 2020, LAD PCI), HTN, HLD, GERD, hx of GIB/diverticular), DM, recurrent R pleural effusion  who is being seen 09/10/2020 for the evaluation of heart block, syncope at the request of Dr. Broadus John.  History of Present Illness:   Nicholas [name] was referred to Dr. Caryl Comes April 2021 for recurrent syncope after wearing a monitor with some NSVT and loop was implanted 05/06/19.  He was hospitalized 06/04/20 with CP, cath showed 40 to 60% distal left main stenosis, 30% in-stent restenosis of proximal LAD, occluded diagonal supplied by collaterals, 50 to 70% mid LAD stenosis, proximal LCx 80%, occluded OM, occluded RCA.  Conservative management recommended  He last saw Dr. Tamala Julian 09/02/20 was doing OK, less CP, no syncope, planned for a thoracentesis at [facility] for his recurrent pleural effusion.  He was seen at [facility] ER after a syncopal spell, and some rectal bleeding, while there he was observed to have a transient episode of heart block with V rates to 40's, transferred to [facility] and admitted yesterday evening by medicine  team, cardiology consulted  Loop had not been interrogated though given observed heart block   LABS K+ 4.4 > 4.7 BUN/Creat 25/5.79  >> 6.88 Mag 2.2 WBC 9.3 >> 7.7 H/H 9/27 >> 7.3/22.6 Plts 245 HS Trop 12, 19, 23 TSH 4.184   He tells me that he has had one syncopal event since implant though was a long while ago, can't remember, and not again until yesterday. He reports the day prior having had 2 bloody BMs, yesterday AFTER his syncopal event had a urgency to have a BM and did, and reports that it was "normal" Yesterday was NOT a dialysis day, he had gone to the  store with his sone (waited in the car) and rode back, he had come in from the car and sat down, feeling fine, and then at some point woke with his wife holding ice on his forehead. He had not fallen out of the chair, he felt fine upon waking and had no pre-syncopal warning. He has not had any CP of ;ate though does get some discomfort from time to time, this has not changed in behavior. He had a symptom episod on his loop last month with SR only, this would have been made for CP he said.  He feels well here  Home meds are reviewed, he is not on any nodal blocking agents   Past Medical History:  Diagnosis Date   Arthritis    Chronic kidney disease    Diabetes mellitus ([place])    Dialysis patient ([place])    GERD (gastroesophageal reflux disease)    occ   Heart murmur    Hypercholesteremia    Hypertension    Psoriasis     Past Surgical History:  Procedure Laterality Date   CARDIAC CATHETERIZATION  09   "some blockage" with collaterals by cath at [facility] ~ 2009; no intervention required; reportedly, no routine cardiology f/u recommended    COLONOSCOPY WITH PROPOFOL N/A 07/06/2020   Procedure: COLONOSCOPY WITH PROPOFOL;  Surgeon: Ronald Lobo, MD;  Location: [place];  Service: Endoscopy;  Laterality: N/A;   CORONARY ATHERECTOMY N/A 08/09/2018  Procedure: CORONARY ATHERECTOMY;  Surgeon: Jettie Booze, MD;  Location: [place] [facility];  Service: Cardiovascular;  Laterality: N/A;   CORONARY STENT INTERVENTION N/A 08/09/2018   Procedure: CORONARY STENT INTERVENTION;  Surgeon: Jettie Booze, MD;  Location: [place] [facility];  Service: Cardiovascular;  Laterality: N/A;   ESOPHAGOGASTRODUODENOSCOPY (EGD) WITH PROPOFOL N/A 07/06/2020   Procedure: ESOPHAGOGASTRODUODENOSCOPY (EGD) WITH PROPOFOL;  Surgeon: Ronald Lobo, MD;  Location: [place];  Service: Endoscopy;  Laterality: N/A;   INGUINAL HERNIA REPAIR Right 04/13/2012   Procedure: HERNIA REPAIR INGUINAL INCARCERATED;   Surgeon: Gayland Curry, MD;  Location: [place];  Service: General;  Laterality: Right;   INSERTION OF MESH Right 04/13/2012   Procedure: INSERTION OF MESH;  Surgeon: Gayland Curry, MD;  Location: [place];  Service: General;  Laterality: Right;   IR THORACENTESIS ASP PLEURAL SPACE W/IMG GUIDE  06/08/2020   LEFT HEART CATH N/A 08/09/2018   Procedure: Left Heart Cath;  Surgeon: Jettie Booze, MD;  Location: [place] [facility];  Service: Cardiovascular;  Laterality: N/A;   LEFT HEART CATH AND CORONARY ANGIOGRAPHY N/A 08/08/2018   Procedure: LEFT HEART CATH AND CORONARY ANGIOGRAPHY;  Surgeon: Troy Sine, MD;  Location: [place] [facility];  Service: Cardiovascular;  Laterality: N/A;   LEFT HEART CATH AND CORONARY ANGIOGRAPHY N/A 06/05/2020   Procedure: LEFT HEART CATH AND CORONARY ANGIOGRAPHY;  Surgeon: Belva Crome, MD;  Location: [place] [facility];  Service: Cardiovascular;  Laterality: N/A;   VASECTOMY       Home Medications:  Prior to Admission medications   Medication Sig Start Date End Date Taking? Authorizing Provider  acetaminophen (TYLENOL) 500 MG tablet Take 500 mg by mouth 3 (three) times daily as needed for moderate pain.   Yes [provider]  atorvastatin (LIPITOR) 80 MG tablet Take 80 mg by mouth at bedtime.   Yes [provider]  clopidogrel (PLAVIX) 75 MG tablet Take 1 tablet (75 mg total) by mouth daily with breakfast. 07/14/20  Yes Gonfa, Charlesetta Ivory, MD  Fluticasone Propionate,sensor, (ARMONAIR DIGIHALER) 232 MCG/ACT AEPB Inhale 1 puff into the lungs 2 (two) times daily.   Yes [provider]  insulin NPH-regular Human (70-30) 100 UNIT/ML injection Inject 20 Units into the skin at bedtime. Takes 20 units in the morning if blood sugar is over 140   Yes [provider]  isosorbide dinitrate (ISORDIL) 20 MG tablet Take 1 tablet (20 mg total) by mouth 2 (two) times daily. 06/08/20  Yes Kathie Dike, MD  Multiple Vitamin (MULTIVITAMIN WITH  MINERALS) TABS tablet Take 1 tablet by mouth daily.   Yes [provider]  nitroGLYCERIN (NITROSTAT) 0.4 MG SL tablet Place 1 tablet (0.4 mg total) under the tongue every 5 (five) minutes x 3 doses as needed for chest pain. 08/10/18  Yes [name], MD  Nutritional Supplements (NEPRO/CARBSTEADY PO) Take 237 mLs by mouth daily. vanilla   Yes [provider]  sevelamer carbonate (RENVELA) 800 MG tablet Take 1,600 mg by mouth 3 (three) times daily. 05/22/19  Yes [provider]  methocarbamol (ROBAXIN) 500 MG tablet Take 1 tablet (500 mg total) by mouth every 8 (eight) hours as needed for muscle spasms. We recommend you take yourself off this medications slowly Patient not taking: Reported on 09/09/2020 07/08/20   Mercy Riding, MD    Inpatient Medications: Scheduled Meds:  atorvastatin  80 mg Oral QHS   insulin aspart  0-6 Units Subcutaneous TID  WC   multivitamin  1 tablet Oral QHS   pantoprazole  40 mg Oral Q1200   sevelamer carbonate  1,600 mg Oral TID with meals   Continuous Infusions:  PRN Meds: acetaminophen **OR** acetaminophen, nitroGLYCERIN  Allergies:    Allergies  Allergen Reactions   Hydrochlorothiazide Other (See Comments)    Unknown [facility]    Social History:   Social History   Socioeconomic History   Marital status: Married    Spouse name: Not on file   Number of children: Not on file   Years of education: Not on file   Highest education level: Not on file  Occupational History   Not on file  Tobacco Use   Smoking status: Former    Packs/day: 1.00    Years: 15.00    Pack years: 15.00    Types: Cigarettes    Quit date: 03/29/1987    Years since quitting: 33.4   Smokeless tobacco: Never  Substance and Sexual Activity   Alcohol use: No   Drug use: No   Sexual activity: Not on file  Other Topics Concern   Not on file  Social History Narrative   Not on file   Social Determinants of Health   Financial Resource  Strain: Not on file  Food Insecurity: Not on file  Transportation Needs: Not on file  Physical Activity: Not on file  Stress: Not on file  Social Connections: Not on file  Intimate Partner Violence: Not on file    Family History:   Family History  Problem Relation Age of Onset   Diabetes Mother    Stroke Father    Diabetes Sister    Diabetes Brother      ROS:  Please see the history of present illness.  All other ROS reviewed and negative.     Physical Exam/Data:   Vitals:   09/10/20 0500 09/10/20 0520 09/10/20 0637 09/10/20 0730  BP: (!) 92/47 (!) 88/57 (!) 116/51 106/65  Pulse:  71 70 78  Resp:  (!) 23 (!) 21 20  Temp:    97.7 F (36.5 C)  TempSrc:    Oral  SpO2:  96%  96%  Weight:      Height:        Intake/Output Summary (Last 24 hours) at 09/10/2020 1023 Last data filed at 09/10/2020 0800 Gross per 24 hour  Intake 689.63 ml  Output --  Net 689.63 ml   Last 3 Weights 09/10/2020 09/09/2020 09/02/2020  Weight (lbs) 149 lb 11.1 oz 151 lb 7.3 oz 155 lb 6.4 oz  Weight (kg) 67.9 kg 68.7 kg 70.489 kg     Body mass index is 23.45 kg/m.  General:  Well nourished, well developed, in no acute distress HEENT: normal Lymph: no adenopathy Neck: no JVD Endocrine:  No thryomegaly Vascular: No carotid bruits; FA pulses 2+ bilaterally without bruits  Cardiac:  RRR; soft SM, no gallops or rubs Lungs:  CTA b/l, no wheezing, rhonchi or rales  Abd: soft, nontender Ext: no edema Musculoskeletal:  No deformities, age appropriate atrophy Skin: warm and dry  Neuro:  no focal abnormalities noted Psych:  Normal affect   EKG:  The EKG was personally reviewed and demonstrates:   SR 95bpm, , no acute ischemic looking changes, normal intervals ST 109, PACs SR 90bpm  Telemetry:  Telemetry was personally reviewed and demonstrates:   SR 80's  ER strips reviewed, approx 40sec CHB, V rate 40, narrow QRS, though with a suspect icRBBB  morphology  Relevant CV Studies:  TTE  07-05-20  1. Left ventricular ejection fraction, by estimation, is 60 to 65%. The  left ventricle has normal function. Left ventricular endocardial border  not optimally defined to evaluate regional wall motion. There is mild left  ventricular hypertrophy. Left  ventricular diastolic parameters are consistent with Grade I diastolic  dysfunction (impaired relaxation). Elevated left ventricular end-diastolic  pressure.   2. Right ventricular systolic function is normal. The right ventricular  size is normal. There is normal pulmonary artery systolic pressure. The  estimated right ventricular systolic pressure is 72.9 mmHg.   3. Left atrial size was mildly dilated.   4. The mitral valve is normal in structure. No evidence of mitral valve  regurgitation. No evidence of mitral stenosis.   5. The aortic valve has an indeterminant number of cusps. Aortic valve  regurgitation is not visualized. Mild aortic valve sclerosis is present,  with no evidence of aortic valve stenosis.   6. The inferior vena cava is normal in size with greater than 50%  respiratory variability, suggesting right atrial pressure of 3 mmHg.    06-05-20 LHC Mid LAD lesion is 60% stenosed.   40 to 60% eccentric distal left main not greatly different than on prior angiography.  Left main is difficult to lay out and not easily visualized due to a loop recorder that block imaging. The peviously stented proximal LAD contains 30% in-stent restenosis.  The jailed diagonal is totally occluded but supplied by collaterals.  The mid LAD beyond the stent contains a 50-70 % eccentric narrowing unchanged from prior imaging. Circumflex territory is small.  The proximal vessel contains 80% stenosis and is unchanged compared to the prior angiogram.  A large obtuse marginal is totally occluded and supplied by left to left collaterals. Right coronary is totally occluded in the distal vessel receives right to right and left to right  collaterals. LVEDP 10 mmHg.  Ventriculography was not performed.   RECOMMENDATIONS:   Compared to prior images, left main is about the same as before.  Symptoms could be related to the left main.  The patient's age, comorbidities, and unwillingness to consider coronary bypass grafting leave few options for management.  PCI would be single remaining vessel and high risk.  Discussed with patient who prefers conservative management at this point. Continue medical therapy. Guarded prognosis.  Laboratory Data:  High Sensitivity Troponin:   Recent Labs  Lab 09/09/20 1551 09/09/20 2025 09/10/20 0049  TROPONINIHS 12 19* 23*     Chemistry Recent Labs  Lab 09/09/20 1551 09/10/20 0439  NA 135 138  K 4.4 4.7  CL 91* 93*  CO2 27 30  GLUCOSE 201* 137*  BUN 25* 27*  CREATININE 5.79* 6.88*  CALCIUM 8.9 9.1  GFRNONAA 9* 7*  ANIONGAP 17* 15    Recent Labs  Lab 09/09/20 1551 09/10/20 0439  PROT 7.7 5.7*  ALBUMIN 3.3* 2.5*  AST 18 15  ALT 22 17  ALKPHOS 85 63  BILITOT 0.3 0.4   Hematology Recent Labs  Lab 09/09/20 2025 09/10/20 0049 09/10/20 0439  WBC 7.7 7.1 7.7  RBC 2.71* 2.76* 2.41*  HGB 8.2* 8.5* 7.3*  HCT 26.0* 25.8* 22.6*  MCV 95.9 93.5 93.8  MCH 30.3 30.8 30.3  MCHC 31.5 32.9 32.3  RDW 15.9* 15.9* 15.8*  PLT 251 225 221   BNPNo results for input(s): BNP, PROBNP in the last 168 hours.  DDimer No results for input(s): DDIMER in the last 168 hours.  Radiology/Studies:  DG Chest Portable 1 View  Result Date: 09/09/2020 CLINICAL DATA:  Syncope, CHF EXAM: PORTABLE CHEST 1 VIEW COMPARISON:  Chest radiograph 07/06/2020 FINDINGS: The cardiomediastinal silhouette is stable. A cardiac loop recorder is again seen. There is a small to medium size right pleural effusion with adjacent airspace disease, similar to the prior study. The right upper lung is well aerated. The left lung is clear. There is no left effusion. There is no pneumothorax. The bones are stable.  IMPRESSION: No significant interval change in size of the small to medium right pleural effusion with adjacent airspace disease since 07/06/2020. Electronically Signed   By: Valetta Mole M.D.   On: 09/09/2020 15:54     Assessment and Plan:   Syncope Transient CHB in the ER with V rate 40, he was laying in the ER stretcher and he reports no symptoms in the ER.  Loop interrogation did not show any observations (with brady set for 30bpm) He was not symptomatic in the ER though was supine  He will need pacing Dr. Quentin Ore will see and discuss timing Most likely a leadless given ESRF/HD   Risk Assessment/Risk Scores:    For questions or updates, please contact [facility] Please consult [email or web address] for contact info under    Signed, Baldwin Jamaica, PA-C  09/10/2020 10:23 AM

## 2020-09-11 DIAGNOSIS — Z833 Family history of diabetes mellitus: Secondary | ICD-10-CM | POA: Diagnosis not present

## 2020-09-11 DIAGNOSIS — I442 Atrioventricular block, complete: Secondary | ICD-10-CM | POA: Diagnosis present

## 2020-09-11 DIAGNOSIS — E1122 Type 2 diabetes mellitus with diabetic chronic kidney disease: Secondary | ICD-10-CM | POA: Diagnosis present

## 2020-09-11 DIAGNOSIS — N2581 Secondary hyperparathyroidism of renal origin: Secondary | ICD-10-CM | POA: Diagnosis present

## 2020-09-11 DIAGNOSIS — D631 Anemia in chronic kidney disease: Secondary | ICD-10-CM | POA: Diagnosis present

## 2020-09-11 DIAGNOSIS — K922 Gastrointestinal hemorrhage, unspecified: Secondary | ICD-10-CM | POA: Diagnosis not present

## 2020-09-11 DIAGNOSIS — Z87891 Personal history of nicotine dependence: Secondary | ICD-10-CM | POA: Diagnosis not present

## 2020-09-11 DIAGNOSIS — K219 Gastro-esophageal reflux disease without esophagitis: Secondary | ICD-10-CM | POA: Diagnosis present

## 2020-09-11 DIAGNOSIS — D649 Anemia, unspecified: Secondary | ICD-10-CM | POA: Diagnosis not present

## 2020-09-11 DIAGNOSIS — I1311 Hypertensive heart and chronic kidney disease without heart failure, with stage 5 chronic kidney disease, or end stage renal disease: Secondary | ICD-10-CM | POA: Diagnosis not present

## 2020-09-11 DIAGNOSIS — I132 Hypertensive heart and chronic kidney disease with heart failure and with stage 5 chronic kidney disease, or end stage renal disease: Secondary | ICD-10-CM | POA: Diagnosis present

## 2020-09-11 DIAGNOSIS — Z20822 Contact with and (suspected) exposure to covid-19: Secondary | ICD-10-CM | POA: Diagnosis present

## 2020-09-11 DIAGNOSIS — I5022 Chronic systolic (congestive) heart failure: Secondary | ICD-10-CM | POA: Diagnosis present

## 2020-09-11 DIAGNOSIS — N186 End stage renal disease: Secondary | ICD-10-CM | POA: Diagnosis present

## 2020-09-11 DIAGNOSIS — E7800 Pure hypercholesterolemia, unspecified: Secondary | ICD-10-CM | POA: Diagnosis present

## 2020-09-11 DIAGNOSIS — D62 Acute posthemorrhagic anemia: Secondary | ICD-10-CM | POA: Diagnosis present

## 2020-09-11 DIAGNOSIS — I959 Hypotension, unspecified: Secondary | ICD-10-CM | POA: Diagnosis present

## 2020-09-11 DIAGNOSIS — Z955 Presence of coronary angioplasty implant and graft: Secondary | ICD-10-CM | POA: Diagnosis not present

## 2020-09-11 DIAGNOSIS — I2510 Atherosclerotic heart disease of native coronary artery without angina pectoris: Secondary | ICD-10-CM | POA: Diagnosis present

## 2020-09-11 DIAGNOSIS — Z7902 Long term (current) use of antithrombotics/antiplatelets: Secondary | ICD-10-CM | POA: Diagnosis not present

## 2020-09-11 DIAGNOSIS — Z992 Dependence on renal dialysis: Secondary | ICD-10-CM

## 2020-09-11 DIAGNOSIS — Z79899 Other long term (current) drug therapy: Secondary | ICD-10-CM | POA: Diagnosis not present

## 2020-09-11 DIAGNOSIS — R001 Bradycardia, unspecified: Secondary | ICD-10-CM | POA: Diagnosis present

## 2020-09-11 DIAGNOSIS — R55 Syncope and collapse: Secondary | ICD-10-CM | POA: Diagnosis present

## 2020-09-11 DIAGNOSIS — N250 Renal osteodystrophy: Secondary | ICD-10-CM | POA: Diagnosis not present

## 2020-09-11 DIAGNOSIS — K921 Melena: Secondary | ICD-10-CM | POA: Diagnosis present

## 2020-09-11 DIAGNOSIS — Z888 Allergy status to other drugs, medicaments and biological substances status: Secondary | ICD-10-CM | POA: Diagnosis not present

## 2020-09-11 DIAGNOSIS — J9611 Chronic respiratory failure with hypoxia: Secondary | ICD-10-CM | POA: Diagnosis present

## 2020-09-11 LAB — GLUCOSE, CAPILLARY
Glucose-Capillary: 119 mg/dL — ABNORMAL HIGH (ref 70–99)
Glucose-Capillary: 176 mg/dL — ABNORMAL HIGH (ref 70–99)
Glucose-Capillary: 192 mg/dL — ABNORMAL HIGH (ref 70–99)
Glucose-Capillary: 170 mg/dL — ABNORMAL HIGH (ref 70–99)

## 2020-09-11 LAB — BASIC METABOLIC PANEL
Anion gap: 7 (ref 5–15)
BUN: 13 mg/dL (ref 8–23)
CO2: 31 mmol/L (ref 22–32)
Calcium: 8.4 mg/dL — ABNORMAL LOW (ref 8.9–10.3)
Chloride: 97 mmol/L — ABNORMAL LOW (ref 98–111)
Creatinine, Ser: 3.77 mg/dL — ABNORMAL HIGH (ref 0.61–1.24)
GFR, Estimated: 15 mL/min — ABNORMAL LOW (ref >60)
Glucose, Bld: 202 mg/dL — ABNORMAL HIGH (ref 70–99)
Potassium: 4 mmol/L (ref 3.5–5.1)
Sodium: 135 mmol/L (ref 135–145)

## 2020-09-11 LAB — HEMOGLOBIN AND HEMATOCRIT, BLOOD
HCT: 27.2 % — ABNORMAL LOW (ref 39.0–52.0)
Hemoglobin: 8.6 g/dL — ABNORMAL LOW (ref 13.0–17.0)

## 2020-09-11 LAB — CBC
HCT: 22.2 % — ABNORMAL LOW (ref 39.0–52.0)
Hemoglobin: 7.1 g/dL — ABNORMAL LOW (ref 13.0–17.0)
MCH: 30.3 pg (ref 26.0–34.0)
MCHC: 32 g/dL (ref 30.0–36.0)
MCV: 94.9 fL (ref 80.0–100.0)
Platelets: 216 10*3/uL (ref 150–400)
RBC: 2.34 MIL/uL — ABNORMAL LOW (ref 4.22–5.81)
RDW: 15.7 % — ABNORMAL HIGH (ref 11.5–15.5)
WBC: 6.1 10*3/uL (ref 4.0–10.5)
nRBC: 0 % (ref 0.0–0.2)

## 2020-09-11 LAB — PREPARE RBC (CROSSMATCH)

## 2020-09-11 MED ORDER — NITROGLYCERIN 0.4 MG SL SUBL
SUBLINGUAL_TABLET | SUBLINGUAL | Status: AC
  Administered 2020-09-11: 0.4 mg
  Filled 2020-09-11: qty 1

## 2020-09-11 MED ORDER — DARBEPOETIN ALFA 60 MCG/0.3ML IJ SOSY: 60.0000 ug | PREFILLED_SYRINGE | INTRAMUSCULAR | Status: DC

## 2020-09-11 MED ORDER — SODIUM CHLORIDE 0.9 % IV BOLUS
250.0000 mL | Freq: Once | INTRAVENOUS | Status: AC
  Administered 2020-09-11: 250 mL via INTRAVENOUS

## 2020-09-11 MED ORDER — SODIUM CHLORIDE 0.9% IV SOLUTION: Freq: Once | INTRAVENOUS | Status: DC

## 2020-09-11 NOTE — Progress Notes (Signed)
PROGRESS NOTE    Nicholas [name]  TWS:[id number] DOB: April 23, 1930 DOA: 09/09/2020 PCP: Pcp, No  Brief Narrative: 87 year old male with history of CAD, PCI/stenting in 07/2018, ESRD on hemodialysis, type 2 diabetes mellitus was brought to the ED following a syncopal event. -On 8/16 patient had an episode of moderate hematochezia followed by another stool with scant blood.  On 8/17 he had normal BMs, went for hemodialysis, was back home went for a walk, and after returning to the house he had a syncopal event.  Was taken to [place] where he had an episode of bradycardia with heart rates dropping to the 40s and had a run of high-grade AV block.  Cardiology was consulted and he was transferred to [place]:   Syncope Bradycardia, transient high-grade AV block -Cardiology consulting, he is not on any AV blockers -ILR interrogated -EP following, after much discussion it was determined that he does not need to PPM at this time -Will need close follow-up with EP  ESRD on hemodialysis -Dialyzed yesterday, nephrology following  Hematochezia on 8/16 -Had 2 episodes of bleeding on 8/16, scant episode yesterday -Hospitalized in June for same, EGD was unremarkable, colonoscopy noted diverticulosis which is likely the source again -Hold Plavix, hemoglobin down to 7.1, will transfuse 1 unit of PRBC -Anemia panel suggestive of chronic disease -If active bleeding ensues will request evaluation from gastroenterology,  Acute blood loss anemia  Anemia of chronic disease -No further hematochezia since 8/16, as above -Continue EPO with HD -Transfusing 1 unit of PRBC as above  Type 2 diabetes mellitus -CBGs are stable, monitor -Insulin 70/30 on hold  DVT prophylaxis: SCDs  code Status: Full code Family Communication: No family at bedside Disposition Plan:  Status is: Observation  The patient will require care spanning > 2 midnights and should be moved to inpatient because:  Inpatient level of care appropriate due to severity of illness  Dispo: The patient is from: Home              Anticipated d/c is to: Home              Patient currently is not medically stable to d/c.   Difficult to place patient No        Consultants:  Cardiology Nephrology  Procedures:   Antimicrobials:    Subjective: -Feels okay Overall, no events overnight, yesterday morning at the stool with some streaks of blood once  Objective: Vitals:   09/11/20 0400 09/11/20 0615 09/11/20 0718 09/11/20 1111  BP: (!) 108/54  (!) 94/46 (!) 84/48  Pulse: 79  89   Resp: 18  20 19   Temp:    97.8 F (36.6 C)  TempSrc:    Oral  SpO2:   95%   Weight:  67 kg    Height:        Intake/Output Summary (Last 24 hours) at 09/11/2020 1114 Last data filed at 09/11/2020 0325 Gross per 24 hour  Intake 874.43 ml  Output 800 ml  Net 74.43 ml   Filed Weights   09/10/20 1310 09/10/20 1630 09/11/20 0615  Weight: 67.6 kg 66.5 kg 67 kg    Examination:  General exam: Pleasant elderly male sitting up in bed, AAOx3 CVS: S1-S2, regular rate rhythm Lungs: Clear bilaterally Abdomen: Soft, nontender, bowel sounds present EXTR extremities: No edema skin: No rash on exposed skin Psych: Appropriate mood and affect  Data Reviewed:   CBC: Recent Labs  Lab 09/09/20 1551 09/09/20  2025 09/10/20 0049 09/10/20 0439 09/10/20 1125 09/11/20 0033  WBC 9.3 7.7 7.1 7.7 8.7 6.1  NEUTROABS 7.4  --   --   --   --   --   HGB 9.0* 8.2* 8.5* 7.3* 8.5* 7.1*  HCT 27.6* 26.0* 25.8* 22.6* 26.1* 22.2*  MCV 94.5 95.9 93.5 93.8 95.3 94.9  PLT 245 251 225 221 254 952   Basic Metabolic Panel: Recent Labs  Lab 09/09/20 1551 09/10/20 0439 09/10/20 1125 09/11/20 0033  NA 135 138  --  135  K 4.4 4.7  --  4.0  CL 91* 93*  --  97*  CO2 27 30  --  31  GLUCOSE 201* 137*  --  202*  BUN 25* 27*  --  13  CREATININE 5.79* 6.88*  --  3.77*  CALCIUM 8.9 9.1  --  8.4*  MG 2.2  --  2.0  --    GFR: Estimated  Creatinine Clearance: 12.9 mL/min (A) (by C-G formula based on SCr of 3.77 mg/dL (H)). Liver Function Tests: Recent Labs  Lab 09/09/20 1551 09/10/20 0439  AST 18 15  ALT 22 17  ALKPHOS 85 63  BILITOT 0.3 0.4  PROT 7.7 5.7*  ALBUMIN 3.3* 2.5*   No results for input(s): LIPASE, AMYLASE in the last 168 hours. No results for input(s): AMMONIA in the last 168 hours. Coagulation Profile: Recent Labs  Lab 09/09/20 1551  INR 1.0   Cardiac Enzymes: No results for input(s): CKTOTAL, CKMB, CKMBINDEX, TROPONINI in the last 168 hours. BNP (last 3 results) No results for input(s): PROBNP in the last 8760 hours. HbA1C: No results for input(s): HGBA1C in the last 72 hours. CBG: Recent Labs  Lab 09/10/20 0608 09/10/20 1129 09/10/20 1742 09/10/20 2151 09/11/20 0606  GLUCAP 125* 202* 86 214* 119*   Lipid Profile: No results for input(s): CHOL, HDL, LDLCALC, TRIG, CHOLHDL, LDLDIRECT in the last 72 hours. Thyroid Function Tests: Recent Labs    09/09/20 2110  TSH 4.302   Anemia Panel: Recent Labs    09/10/20 1125  VITAMINB12 342  FOLATE 8.3  FERRITIN 1,653*  TIBC 224*  IRON 58  RETICCTPCT 1.1   Urine analysis: No results found for: COLORURINE, APPEARANCEUR, LABSPEC, PHURINE, GLUCOSEU, HGBUR, BILIRUBINUR, KETONESUR, PROTEINUR, UROBILINOGEN, NITRITE, LEUKOCYTESUR Sepsis Labs: @LABRCNTIP (procalcitonin:4,lacticidven:4)  ) Recent Results (from the past 240 hour(s))  SARS CORONAVIRUS 2 (TAT 6-24 HRS) Nasopharyngeal Nasopharyngeal Swab     Status: None   Collection Time: 09/09/20  5:27 PM   Specimen: Nasopharyngeal Swab  Result Value Ref Range Status   SARS Coronavirus 2 NEGATIVE NEGATIVE Final    Comment: (NOTE) SARS-CoV-2 target nucleic acids are NOT DETECTED.  The SARS-CoV-2 RNA is generally detectable in upper and lower respiratory specimens during the acute phase of infection. Negative results do not preclude SARS-CoV-2 infection, do not rule out co-infections with  other pathogens, and should not be used as the sole basis for treatment or other patient management decisions. Negative results must be combined with clinical observations, patient history, and epidemiological information. The expected result is Negative.  Fact Sheet for Patients: SugarRoll.be  Fact Sheet for Healthcare Providers: https://www.woods-mathews.com/  This test is not yet approved or cleared by the [place] FDA and  has been authorized for detection and/or diagnosis of SARS-CoV-2 by FDA under an Emergency Use Authorization (EUA). This EUA will remain  in effect (meaning this test can be used) for the duration of the COVID-19 declaration under Se ction 564(b)(1) of the  Act, 21 U.S.C. section 360bbb-3(b)(1), unless the authorization is terminated or revoked sooner.  Performed at [facility], [place] [address] [place], [place] [address]   MRSA Next Gen by PCR, Nasal     Status: None   Collection Time: 09/09/20  7:55 PM   Specimen: Nasal Mucosa; Nasal Swab  Result Value Ref Range Status   MRSA by PCR Next Gen NOT DETECTED NOT DETECTED Final    Comment: (NOTE) The GeneXpert MRSA Assay (FDA approved for NASAL specimens only), is one component of a comprehensive MRSA colonization surveillance program. It is not intended to diagnose MRSA infection nor to guide or monitor treatment for MRSA infections. Test performance is not FDA approved in patients less than 59 years old. Performed at [facility], [place] [address] [place], [place] [address]      Radiology Studies: DG Chest Portable 1 View  Result Date: 09/09/2020 CLINICAL DATA:  Syncope, CHF EXAM: PORTABLE CHEST 1 VIEW COMPARISON:  Chest radiograph 07/06/2020 FINDINGS: The cardiomediastinal silhouette is stable. A cardiac loop recorder is again seen. There is a small to medium size right pleural effusion with adjacent airspace disease, similar to the prior study.  The right upper lung is well aerated. The left lung is clear. There is no left effusion. There is no pneumothorax. The bones are stable. IMPRESSION: No significant interval change in size of the small to medium right pleural effusion with adjacent airspace disease since 07/06/2020. Electronically Signed   By: Valetta Mole M.D.   On: 09/09/2020 15:54        Scheduled Meds:  sodium chloride   Intravenous Once   atorvastatin  80 mg Oral QHS   Chlorhexidine Gluconate Cloth  6 each Topical Q0600   [START ON 09/12/2020] darbepoetin (ARANESP) injection - DIALYSIS  60 mcg Intravenous Q Sat-HD   insulin aspart  0-6 Units Subcutaneous TID WC   isosorbide dinitrate  20 mg Oral BID   multivitamin  1 tablet Oral QHS   pantoprazole  40 mg Oral Q1200   sevelamer carbonate  1,600 mg Oral TID with meals   Continuous Infusions:   LOS: 0 days    Time spent: 84min  Domenic Polite, MD [facility]   09/11/2020, 11:14 AM

## 2020-09-11 NOTE — Progress Notes (Signed)
  [facility] Progress Note   Assessment/ Plan:   Dialysis Orders:   [facility] 3.5h 69kg 3K/2.5Ca  Hectorol 20mcg TIW No ESA      Assessment/Plan: Syncope- bradycardia +HB on arrival. Cardiology/EP following- per their notes no need for pacer ESRD -  HD TTS. Continue on schedule. HD tomorrow, no heparin Hypertension/volume  - BP/volume ok. No BP meds. Gentle UF on HD  Anemia/Rectal bleeding - +FOBT. Hgb 7.3. Not on ESA as outpatient -will order here. Follow trends--> may need GI c/s Metabolic bone disease -  Ca ok. Continue home binders/VDRA. Follow trends here.  Nutrition - Renal diet w fluid restriction   Subjective:    Seen in room. Sleeping, no issues and no complaints.  HD yesterday without incident.  Hgb down to 7.1 today.     Objective:   BP (!) 94/46 (BP Location: Right Arm)   Pulse 89   Temp 98.4 F (36.9 C) (Axillary)   Resp 20   Ht 5\' 7"  (1.702 m)   Wt 67 kg   SpO2 95%   BMI 23.13 kg/m   Physical Exam: Gen: sleeping, NAD CVS: RRR Resp:clear IWP:YKDX Ext: no LE edema ACCESS: L FA AVF  Labs: BMET Recent Labs  Lab 09/09/20 1551 09/10/20 0439 09/11/20 0033  NA 135 138 135  K 4.4 4.7 4.0  CL 91* 93* 97*  CO2 27 30 31   GLUCOSE 201* 137* 202*  BUN 25* 27* 13  CREATININE 5.79* 6.88* 3.77*  CALCIUM 8.9 9.1 8.4*   CBC Recent Labs  Lab 09/09/20 1551 09/09/20 2025 09/10/20 0049 09/10/20 0439 09/10/20 1125 09/11/20 0033  WBC 9.3   < > 7.1 7.7 8.7 6.1  NEUTROABS 7.4  --   --   --   --   --   HGB 9.0*   < > 8.5* 7.3* 8.5* 7.1*  HCT 27.6*   < > 25.8* 22.6* 26.1* 22.2*  MCV 94.5   < > 93.5 93.8 95.3 94.9  PLT 245   < > 225 221 254 216   < > = values in this interval not displayed.      Medications:     sodium chloride   Intravenous Once   atorvastatin  80 mg Oral QHS   Chlorhexidine Gluconate Cloth  6 each Topical Q0600   [START ON 09/17/2020] darbepoetin (ARANESP) injection - DIALYSIS  60 mcg Intravenous Q Thu-HD    insulin aspart  0-6 Units Subcutaneous TID WC   isosorbide dinitrate  20 mg Oral BID   multivitamin  1 tablet Oral QHS   pantoprazole  40 mg Oral Q1200   sevelamer carbonate  1,600 mg Oral TID with meals     Madelon Lips MD 09/11/2020, 10:26 AM

## 2020-09-11 NOTE — Progress Notes (Addendum)
Progress Note  Patient Name: Nicholas [name] Date of Encounter: 09/11/2020  [facility] Cardiologist: Sinclair Grooms, MD   Subjective   "I feel good, can I go home?"  No CP, palpitations, SOB, no syncope or dizzy spells  Inpatient Medications    Scheduled Meds:  sodium chloride   Intravenous Once   atorvastatin  80 mg Oral QHS   Chlorhexidine Gluconate Cloth  6 each Topical Q0600   [START ON 09/17/2020] darbepoetin (ARANESP) injection - DIALYSIS  60 mcg Intravenous Q Thu-HD   insulin aspart  0-6 Units Subcutaneous TID WC   isosorbide dinitrate  20 mg Oral BID   multivitamin  1 tablet Oral QHS   pantoprazole  40 mg Oral Q1200   sevelamer carbonate  1,600 mg Oral TID with meals   Continuous Infusions:  PRN Meds: acetaminophen **OR** acetaminophen, calcium carbonate (dosed in mg elemental calcium), camphor-menthol **AND** hydrOXYzine, docusate sodium, feeding supplement (NEPRO CARB STEADY), ondansetron **OR** ondansetron (ZOFRAN) IV, sorbitol, zolpidem   Vital Signs    Vitals:   09/11/20 0325 09/11/20 0400 09/11/20 0615 09/11/20 0718  BP: (!) 90/48 (!) 108/54  (!) 94/46  Pulse: 79 79  89  Resp: 20 18  20   Temp: 98.4 F (36.9 C)     TempSrc: Axillary     SpO2: 100%   95%  Weight:   67 kg   Height:        Intake/Output Summary (Last 24 hours) at 09/11/2020 0853 Last data filed at 09/11/2020 0325 Gross per 24 hour  Intake 874.43 ml  Output 800 ml  Net 74.43 ml   Last 3 Weights 09/11/2020 09/10/2020 09/10/2020  Weight (lbs) 147 lb 11.3 oz 146 lb 9.7 oz 149 lb 0.5 oz  Weight (kg) 67 kg 66.5 kg 67.6 kg      Telemetry    SR generally 80s last evening very brief 2:1 with narrow QRS and sinus slowing leading into it - Personally Reviewed  ECG    No new EKGs - Personally Reviewed  Physical Exam   GEN: No acute distress.   Neck: No JVD Cardiac: RRR, no murmurs, rubs, or gallops.  Respiratory: CTA b/l. GI: Soft, nontender, non-distended  MS: No edema; No  deformity. Neuro:  Nonfocal  Psych: Normal affect   Labs    High Sensitivity Troponin:   Recent Labs  Lab 09/09/20 1551 09/09/20 2025 09/10/20 0049  TROPONINIHS 12 19* 23*      Chemistry Recent Labs  Lab 09/09/20 1551 09/10/20 0439 09/11/20 0033  NA 135 138 135  K 4.4 4.7 4.0  CL 91* 93* 97*  CO2 27 30 31   GLUCOSE 201* 137* 202*  BUN 25* 27* 13  CREATININE 5.79* 6.88* 3.77*  CALCIUM 8.9 9.1 8.4*  PROT 7.7 5.7*  --   ALBUMIN 3.3* 2.5*  --   AST 18 15  --   ALT 22 17  --   ALKPHOS 85 63  --   BILITOT 0.3 0.4  --   GFRNONAA 9* 7* 15*  ANIONGAP 17* 15 7     Hematology Recent Labs  Lab 09/10/20 0439 09/10/20 1125 09/11/20 0033  WBC 7.7 8.7 6.1  RBC 2.41* 2.74*  2.69* 2.34*  HGB 7.3* 8.5* 7.1*  HCT 22.6* 26.1* 22.2*  MCV 93.8 95.3 94.9  MCH 30.3 31.0 30.3  MCHC 32.3 32.6 32.0  RDW 15.8* 15.9* 15.7*  PLT 221 254 216    BNPNo results for input(s): BNP, PROBNP in the  last 168 hours.   DDimer No results for input(s): DDIMER in the last 168 hours.   Radiology    DG Chest Portable 1 View  Result Date: 09/09/2020 CLINICAL DATA:  Syncope, CHF EXAM: PORTABLE CHEST 1 VIEW COMPARISON:  Chest radiograph 07/06/2020 FINDINGS: The cardiomediastinal silhouette is stable. A cardiac loop recorder is again seen. There is a small to medium size right pleural effusion with adjacent airspace disease, similar to the prior study. The right upper lung is well aerated. The left lung is clear. There is no left effusion. There is no pneumothorax. The bones are stable. IMPRESSION: No significant interval change in size of the small to medium right pleural effusion with adjacent airspace disease since 07/06/2020. Electronically Signed   By: Valetta Mole M.D.   On: 09/09/2020 15:54    Cardiac Studies   TTE 07-05-20  1. Left ventricular ejection fraction, by estimation, is 60 to 65%. The  left ventricle has normal function. Left ventricular endocardial border  not optimally defined  to evaluate regional wall motion. There is mild left  ventricular hypertrophy. Left  ventricular diastolic parameters are consistent with Grade I diastolic  dysfunction (impaired relaxation). Elevated left ventricular end-diastolic  pressure.   2. Right ventricular systolic function is normal. The right ventricular  size is normal. There is normal pulmonary artery systolic pressure. The  estimated right ventricular systolic pressure is 10.9 mmHg.   3. Left atrial size was mildly dilated.   4. The mitral valve is normal in structure. No evidence of mitral valve  regurgitation. No evidence of mitral stenosis.   5. The aortic valve has an indeterminant number of cusps. Aortic valve  regurgitation is not visualized. Mild aortic valve sclerosis is present,  with no evidence of aortic valve stenosis.   6. The inferior vena cava is normal in size with greater than 50%  respiratory variability, suggesting right atrial pressure of 3 mmHg.    06-05-20 LHC Mid LAD lesion is 60% stenosed.   40 to 60% eccentric distal left main not greatly different than on prior angiography.  Left main is difficult to lay out and not easily visualized due to a loop recorder that block imaging. The peviously stented proximal LAD contains 30% in-stent restenosis.  The jailed diagonal is totally occluded but supplied by collaterals.  The mid LAD beyond the stent contains a 50-70 % eccentric narrowing unchanged from prior imaging. Circumflex territory is small.  The proximal vessel contains 80% stenosis and is unchanged compared to the prior angiogram.  A large obtuse marginal is totally occluded and supplied by left to left collaterals. Right coronary is totally occluded in the distal vessel receives right to right and left to right collaterals. LVEDP 10 mmHg.  Ventriculography was not performed.   RECOMMENDATIONS:   Compared to prior images, left main is about the same as before.  Symptoms could be related to the left  main.  The patient's age, comorbidities, and unwillingness to consider coronary bypass grafting leave few options for management.  PCI would be single remaining vessel and high risk.  Discussed with patient who prefers conservative management at this point. Continue medical therapy. Guarded prognosis.    Patient Profile     87 y.o. male  with a hx of ESRF on HD, CAD (s/p STEMI 2020, LAD PCI, medically managed going forward), HTN, HLD, GERD, hx of GIB/diverticular), DM, recurrent R pleural effusion , syncope w/ILR admitted with recurrent syncope and LGIB/bloody stool.  Assessment & Plan  Syncope Transient CHB in the ER  After further discussion with the patient's wife yesterday afternoon       This was associated with episode of vomiting/nausea and no syncope        Spells at home she reports have been as long as 10 minutes (?)       Baseline EG with normal intervals and no conduction system disease       Last night with very brief 2:1 with sinus slowing preceding as well and again, vagal and without symptoms  ILR programed to brady detection at 40bpm At this time, not felt to need PPM Avoid nodal blocking agents  Close out patient EP follow up is in place Dr. Quentin Ore has seen and examined the patient this AM and reviewed telemetry OK to discharge from EP perspective when ready medically otherwise. We will sign off though remain available, please recall if needed  For questions or updates, please contact [place] Please consult [email or web address] for contact info under        Signed, Baldwin Jamaica, PA-C  09/11/2020, 8:53 AM

## 2020-09-11 NOTE — Plan of Care (Signed)

## 2020-09-12 LAB — TYPE AND SCREEN
ABO/RH(D): A NEG
Antibody Screen: NEGATIVE
Unit division: 0

## 2020-09-12 LAB — BPAM RBC
Blood Product Expiration Date: 202209022359
ISSUE DATE / TIME: 202208191431
Unit Type and Rh: 600

## 2020-09-12 LAB — CBC
HCT: 23.5 % — ABNORMAL LOW (ref 39.0–52.0)
HCT: 24.7 % — ABNORMAL LOW (ref 39.0–52.0)
Hemoglobin: 7.5 g/dL — ABNORMAL LOW (ref 13.0–17.0)
Hemoglobin: 8.2 g/dL — ABNORMAL LOW (ref 13.0–17.0)
MCH: 29.8 pg (ref 26.0–34.0)
MCH: 30.9 pg (ref 26.0–34.0)
MCHC: 31.9 g/dL (ref 30.0–36.0)
MCHC: 33.2 g/dL (ref 30.0–36.0)
MCV: 93.3 fL (ref 80.0–100.0)
MCV: 93.2 fL (ref 80.0–100.0)
Platelets: 207 10*3/uL (ref 150–400)
Platelets: 211 10*3/uL (ref 150–400)
RBC: 2.52 MIL/uL — ABNORMAL LOW (ref 4.22–5.81)
RBC: 2.65 MIL/uL — ABNORMAL LOW (ref 4.22–5.81)
RDW: 17.1 % — ABNORMAL HIGH (ref 11.5–15.5)
RDW: 17 % — ABNORMAL HIGH (ref 11.5–15.5)
WBC: 5.3 10*3/uL (ref 4.0–10.5)
WBC: 6 10*3/uL (ref 4.0–10.5)
nRBC: 0 % (ref 0.0–0.2)
nRBC: 0 % (ref 0.0–0.2)

## 2020-09-12 LAB — BASIC METABOLIC PANEL
Anion gap: 10 (ref 5–15)
BUN: 28 mg/dL — ABNORMAL HIGH (ref 8–23)
CO2: 26 mmol/L (ref 22–32)
Calcium: 8.1 mg/dL — ABNORMAL LOW (ref 8.9–10.3)
Chloride: 98 mmol/L (ref 98–111)
Creatinine, Ser: 6.4 mg/dL — ABNORMAL HIGH (ref 0.61–1.24)
GFR, Estimated: 8 mL/min — ABNORMAL LOW (ref >60)
Glucose, Bld: 186 mg/dL — ABNORMAL HIGH (ref 70–99)
Potassium: 4.4 mmol/L (ref 3.5–5.1)
Sodium: 134 mmol/L — ABNORMAL LOW (ref 135–145)

## 2020-09-12 LAB — GLUCOSE, CAPILLARY
Glucose-Capillary: 123 mg/dL — ABNORMAL HIGH (ref 70–99)
Glucose-Capillary: 177 mg/dL — ABNORMAL HIGH (ref 70–99)
Glucose-Capillary: 246 mg/dL — ABNORMAL HIGH (ref 70–99)
Glucose-Capillary: 163 mg/dL — ABNORMAL HIGH (ref 70–99)

## 2020-09-12 MED ORDER — MIDODRINE HCL 5 MG PO TABS
5.0000 mg | ORAL_TABLET | Freq: Two times a day (BID) | ORAL | Status: DC
  Administered 2020-09-12: 5 mg via ORAL
  Filled 2020-09-12 (×2): qty 1

## 2020-09-12 MED ORDER — ALTEPLASE 2 MG IJ SOLR
2.0000 mg | Freq: Once | INTRAMUSCULAR | Status: DC | PRN
  Filled 2020-09-12: qty 2

## 2020-09-12 MED ORDER — SODIUM CHLORIDE 0.9 % IV SOLN: 100.0000 mL | INTRAVENOUS | Status: DC | PRN

## 2020-09-12 MED ORDER — LIDOCAINE-PRILOCAINE 2.5-2.5 % EX CREA: 1.0000 "application " | TOPICAL_CREAM | CUTANEOUS | Status: DC | PRN

## 2020-09-12 MED ORDER — HEPARIN SODIUM (PORCINE) 1000 UNIT/ML DIALYSIS: 1000.0000 [IU] | INTRAMUSCULAR | Status: DC | PRN

## 2020-09-12 MED ORDER — PENTAFLUOROPROP-TETRAFLUOROETH EX AERO: 1.0000 "application " | INHALATION_SPRAY | CUTANEOUS | Status: DC | PRN

## 2020-09-12 MED ORDER — LIDOCAINE HCL (PF) 1 % IJ SOLN: 5.0000 mL | INTRAMUSCULAR | Status: DC | PRN

## 2020-09-12 MED ORDER — MIDODRINE HCL 5 MG PO TABS
ORAL_TABLET | ORAL | Status: AC
  Administered 2020-09-12: 10 mg via ORAL
  Filled 2020-09-12: qty 2

## 2020-09-12 NOTE — Progress Notes (Signed)
   09/12/20 1100  Assess: MEWS Score  BP (!) 92/47  Pulse Rate 84  ECG Heart Rate 85  Resp 20  Level of Consciousness Alert  SpO2 92 %  Assess: if the MEWS score is Yellow or Red  Were vital signs taken at a resting state? Yes  Focused Assessment No change from prior assessment  Early Detection of Sepsis Score *See Row Information* Low  MEWS guidelines implemented *See Row Information* No, previously yellow, continue vital signs every 4 hours  Treat  MEWS Interventions Escalated (See documentation below)  Take Vital Signs  Increase Vital Sign Frequency  Red: Q 1hr X 4 then Q 4hr X 4, if remains red, continue Q 4hrs  Escalate  MEWS: Escalate Yellow: discuss with charge nurse/RN and consider discussing with provider and RRT  Notify: Charge Nurse/RN  Name of Charge Nurse/RN Notified Tamera  Date Charge Nurse/RN Notified 09/12/20  Time Charge Nurse/RN Notified 1100  Notify: Provider  Provider Name/Title Fanny Bien  Date Provider Notified 09/12/20  Time Provider Notified 1100  Notification Type Page  Notification Reason Change in status (low BPs)  Provider response See new orders  Document  Patient Outcome Other (Comment) (stable)  Progress note created (see row info) Yes

## 2020-09-12 NOTE — Progress Notes (Signed)
PROGRESS NOTE    Nicholas [name]  PHX:[id number] DOB: Apr 25, 1930 DOA: 09/09/2020 PCP: Pcp, No  Brief Narrative: 87 year old male with history of CAD, PCI/stenting in 07/2018, ESRD on hemodialysis, type 2 diabetes mellitus was brought to the ED following a syncopal event. -On 8/16 patient had an episode of moderate hematochezia followed by another stool with scant blood.  On 8/17 he had normal BMs, went for hemodialysis, was back home went for a walk, and after returning to the house he had a syncopal event.  Was taken to [place] where he had an episode of bradycardia with heart rates dropping to the 40s and had a run of high-grade AV block.  Cardiology was consulted and he was transferred to [place]:   Syncope Bradycardia, transient high-grade AV block -Cardiology consulting, he is not on any AV blockers -ILR interrogated -EP following, after much discussion it was determined that he does not need to PPM at this time -Will need close follow-up with EP  ESRD on hemodialysis -Dialysis today, nephrology following  Hematochezia on 8/16 -Had 2 episodes of bleeding on 8/16, scant episode yesterday 8/18 a.m., none in the last 48 hours -Hospitalized in June for same, EGD was unremarkable, colonoscopy noted diverticulosis which is likely the source again -Hold Plavix, hemoglobin down to 7.1 yesterday, transfused 1 unit of PRBC -Anemia panel suggestive of chronic disease -If active bleeding ensues will request evaluation from gastroenterology, -Repeat CBC after HD  Acute blood loss anemia  Anemia of chronic disease -Hematochezia on 8/16, none since then -Continue EPO with HD -Transfuse 1 unit of PRBC yesterday, check CBC after HD  Type 2 diabetes mellitus -CBGs are stable, monitor -Insulin 70/30 on hold  DVT prophylaxis: SCDs  code Status: Full code Family Communication: No family at bedside Disposition Plan:  Status is:  Inpatient level of care appropriate  due to severity of illness  Dispo: The patient is from: Home              Anticipated d/c is to: Home later today if CBC is stable              Patient currently is not medically stable to d/c.   Difficult to place patient No        Consultants:  Cardiology Nephrology  Procedures:   Antimicrobials:    Subjective: -Feels okay, denies any complaints, no episodes of further bleeding  Objective: Vitals:   09/12/20 1041 09/12/20 1044 09/12/20 1046 09/12/20 1048  BP: (!) 91/49 (!) 86/49 (!) 83/49 (!) 91/46  Pulse: 82 81  86  Resp: 19 (!) 21 18 20   Temp:      TempSrc:      SpO2: 91% 93%  (!) 88%  Weight:      Height:        Intake/Output Summary (Last 24 hours) at 09/12/2020 1104 Last data filed at 09/11/2020 1916 Gross per 24 hour  Intake 758 ml  Output --  Net 758 ml   Filed Weights   09/10/20 1630 09/11/20 0615 09/12/20 0623  Weight: 66.5 kg 67 kg 67.9 kg    Examination:  General exam: Pleasant elderly male sitting up in bed, AAOx3, no distress CVS: S1-S2, regular rate rhythm Lungs: Clear bilaterally Abdomen: Soft, nontender, bowel sounds present Extremities: No edema Skin: No rash on exposed skin  Psych: Appropriate mood and affect  Data Reviewed:   CBC: Recent Labs  Lab 09/09/20 1551 09/09/20 2025 09/10/20 0049 09/10/20  5956 09/10/20 1125 09/11/20 0033 09/11/20 1917 09/12/20 0153  WBC 9.3   < > 7.1 7.7 8.7 6.1  --  5.3  NEUTROABS 7.4  --   --   --   --   --   --   --   HGB 9.0*   < > 8.5* 7.3* 8.5* 7.1* 8.6* 7.5*  HCT 27.6*   < > 25.8* 22.6* 26.1* 22.2* 27.2* 23.5*  MCV 94.5   < > 93.5 93.8 95.3 94.9  --  93.3  PLT 245   < > 225 221 254 216  --  207   < > = values in this interval not displayed.   Basic Metabolic Panel: Recent Labs  Lab 09/09/20 1551 09/10/20 0439 09/10/20 1125 09/11/20 0033 09/12/20 0153  NA 135 138  --  135 134*  K 4.4 4.7  --  4.0 4.4  CL 91* 93*  --  97* 98  CO2 27 30  --  31 26  GLUCOSE 201* 137*  --  202*  186*  BUN 25* 27*  --  13 28*  CREATININE 5.79* 6.88*  --  3.77* 6.40*  CALCIUM 8.9 9.1  --  8.4* 8.1*  MG 2.2  --  2.0  --   --    GFR: Estimated Creatinine Clearance: 7.6 mL/min (A) (by C-G formula based on SCr of 6.4 mg/dL (H)). Liver Function Tests: Recent Labs  Lab 09/09/20 1551 09/10/20 0439  AST 18 15  ALT 22 17  ALKPHOS 85 63  BILITOT 0.3 0.4  PROT 7.7 5.7*  ALBUMIN 3.3* 2.5*   No results for input(s): LIPASE, AMYLASE in the last 168 hours. No results for input(s): AMMONIA in the last 168 hours. Coagulation Profile: Recent Labs  Lab 09/09/20 1551  INR 1.0   Cardiac Enzymes: No results for input(s): CKTOTAL, CKMB, CKMBINDEX, TROPONINI in the last 168 hours. BNP (last 3 results) No results for input(s): PROBNP in the last 8760 hours. HbA1C: No results for input(s): HGBA1C in the last 72 hours. CBG: Recent Labs  Lab 09/11/20 0606 09/11/20 1113 09/11/20 1700 09/11/20 2116 09/12/20 0622  GLUCAP 119* 176* 192* 170* 123*   Lipid Profile: No results for input(s): CHOL, HDL, LDLCALC, TRIG, CHOLHDL, LDLDIRECT in the last 72 hours. Thyroid Function Tests: Recent Labs    09/09/20 2110  TSH 4.302   Anemia Panel: Recent Labs    09/10/20 1125  VITAMINB12 342  FOLATE 8.3  FERRITIN 1,653*  TIBC 224*  IRON 58  RETICCTPCT 1.1   Urine analysis: No results found for: COLORURINE, APPEARANCEUR, LABSPEC, PHURINE, GLUCOSEU, HGBUR, BILIRUBINUR, KETONESUR, PROTEINUR, UROBILINOGEN, NITRITE, LEUKOCYTESUR Sepsis Labs: @LABRCNTIP (procalcitonin:4,lacticidven:4)  ) Recent Results (from the past 240 hour(s))  SARS CORONAVIRUS 2 (TAT 6-24 HRS) Nasopharyngeal Nasopharyngeal Swab     Status: None   Collection Time: 09/09/20  5:27 PM   Specimen: Nasopharyngeal Swab  Result Value Ref Range Status   SARS Coronavirus 2 NEGATIVE NEGATIVE Final    Comment: (NOTE) SARS-CoV-2 target nucleic acids are NOT DETECTED.  The SARS-CoV-2 RNA is generally detectable in upper and  lower respiratory specimens during the acute phase of infection. Negative results do not preclude SARS-CoV-2 infection, do not rule out co-infections with other pathogens, and should not be used as the sole basis for treatment or other patient management decisions. Negative results must be combined with clinical observations, patient history, and epidemiological information. The expected result is Negative.  Fact Sheet for Patients: SugarRoll.be  Fact Sheet for Healthcare  Providers: https://www.woods-mathews.com/  This test is not yet approved or cleared by the [place] and  has been authorized for detection and/or diagnosis of SARS-CoV-2 by FDA under an Emergency Use Authorization (EUA). This EUA will remain  in effect (meaning this test can be used) for the duration of the COVID-19 declaration under Se ction 564(b)(1) of the Act, 21 U.S.C. section 360bbb-3(b)(1), unless the authorization is terminated or revoked sooner.  Performed at [facility], [place] [address], [place], [place] [address]   MRSA Next Gen by PCR, Nasal     Status: None   Collection Time: 09/09/20  7:55 PM   Specimen: Nasal Mucosa; Nasal Swab  Result Value Ref Range Status   MRSA by PCR Next Gen NOT DETECTED NOT DETECTED Final    Comment: (NOTE) The GeneXpert MRSA Assay (FDA approved for NASAL specimens only), is one component of a comprehensive MRSA colonization surveillance program. It is not intended to diagnose MRSA infection nor to guide or monitor treatment for MRSA infections. Test performance is not FDA approved in patients less than 11 years old. Performed at [facility], [place] [address], [place], [place] [address]      Radiology Studies: No results found.  Scheduled Meds:  sodium chloride   Intravenous Once   atorvastatin  80 mg Oral QHS   Chlorhexidine Gluconate Cloth  6 each Topical Q0600   darbepoetin (ARANESP) injection -  DIALYSIS  60 mcg Intravenous Q Sat-HD   insulin aspart  0-6 Units Subcutaneous TID WC   isosorbide dinitrate  20 mg Oral BID   multivitamin  1 tablet Oral QHS   pantoprazole  40 mg Oral Q1200   sevelamer carbonate  1,600 mg Oral TID with meals   Continuous Infusions:   LOS: 1 day    Time spent: 55min  Domenic Polite, MD [facility]   09/12/2020, 11:04 AM

## 2020-09-12 NOTE — Plan of Care (Signed)

## 2020-09-12 NOTE — Evaluation (Signed)
Physical Therapy Evaluation Patient Details Name: Nicholas [name] MRN: [id number] DOB: Mar 30, 1930 Today's Date: 09/12/2020   History of Present Illness  87 y.o. male presents to [facility] ED on 09/09/2020 with syncopal event and recent hematochezia. Pt found to have bradycardia with transient high-grade AV block. Pt also with anemia. PMH includes CAD, PCI/stenting in 07/2018, ESRD on hemodialysis, type 2 diabetes mellitus.   Clinical Impression  Pt in bed upon arrival of PT, agreeable to evaluation at this time. Prior to admission the pt reports he was able to ambulate short distances with use of a SPC and completed ADLs with the assist of his son. The pt now presents with good strength and coordination to complete initial bed mobility, but all further mobility was limited at this time due to soft BP that progressively worsened with prolonged time in upright seated position. (See below). The pt reported no dizziness or fatigue, was instructed in LE, UE, and core exercises he can complete while in bed to maintain some mobility, but will benefit from skilled PT to continue to progress OOB mobility and activity tolerance.   VITALS:  - sitting in bed upon arrival of PT, HOB elevated to 50 deg - BP: 78/46 (57) - supine in bed - BP: 80/51 (61);  - sitting EOB - BP: 86/49 (60); - sitting EOB after 2 min - 83/49 (59);  - supine in bed - BP: 91/46 (60); - semi-reclined, HOB at 40 deg - 92/47 (62);        Follow Up Recommendations Outpatient PT;Supervision/Assistance - 24 hour (vs no PT follow up pending OOB progression)    Equipment Recommendations  None recommended by PT (pt well equipped)    Recommendations for Other Services       Precautions / Restrictions Precautions Precautions: Other (comment) Precaution Comments: orthostatic Restrictions Weight Bearing Restrictions: No      Mobility  Bed Mobility Overal bed mobility: Modified Independent             General bed mobility comments:  pt completed without assist, slightly increased time    Transfers                 General transfer comment: deferred due to BP drop to 83/49 (59) after sitting EOB for 2 min         Balance Overall balance assessment: Mild deficits observed, not formally tested (mild deficits in sitting, unable to assess standing balance)                                           Pertinent Vitals/Pain Pain Assessment: No/denies pain    Home Living Family/patient expects to be discharged to:: Private residence Living Arrangements: Spouse/significant other;Children Available Help at Discharge: Family;Available 24 hours/day Type of Home: House Home Access: Stairs to enter Entrance Stairs-Rails: None Entrance Stairs-Number of Steps: 1 Home Layout: Two level;Bed/bath upstairs Home Equipment: Walker - 4 wheels;Cane - single point;Shower seat - built in;Wheelchair - manual Additional Comments: reports 2L O2 at home    Prior Function Level of Independence: Needs assistance   Gait / Transfers Assistance Needed: uses cane for short distances in the home  ADL's / Homemaking Assistance Needed: assist from son for all IADLs and bathing        Hand Dominance        Extremity/Trunk Assessment   Upper Extremity Assessment Upper Extremity Assessment: Overall  WFL for tasks assessed    Lower Extremity Assessment Lower Extremity Assessment: Overall WFL for tasks assessed    Cervical / Trunk Assessment Cervical / Trunk Assessment: Kyphotic  Communication   Communication: No difficulties  Cognition Arousal/Alertness: Awake/alert Behavior During Therapy: WFL for tasks assessed/performed Overall Cognitive Status: Within Functional Limits for tasks assessed                                        General Comments General comments (skin integrity, edema, etc.): soft BP upon arrival, continued dropping with any prolonged upright position, RN aware.     Exercises General Exercises - Lower Extremity Ankle Circles/Pumps: AROM;Both;15 reps;Supine Quad Sets: AROM;Both;10 reps;Supine Heel Slides: AROM;Both;10 reps;Supine Straight Leg Raises: AROM;Both;10 reps;Supine   Assessment/Plan    PT Assessment Patient needs continued PT services  PT Problem List Decreased activity tolerance;Decreased balance;Decreased mobility       PT Treatment Interventions DME instruction;Gait training;Stair training;Functional mobility training;Therapeutic activities;Therapeutic exercise;Patient/family education    PT Goals (Current goals can be found in the Care Plan section)  Acute Rehab PT Goals Patient Stated Goal: to get out of bed PT Goal Formulation: With patient Time For Goal Achievement: 09/26/20 Potential to Achieve Goals: Good    Frequency Min 3X/week    AM-PAC PT "6 Clicks" Mobility  Outcome Measure Help needed turning from your back to your side while in a flat bed without using bedrails?: None Help needed moving from lying on your back to sitting on the side of a flat bed without using bedrails?: None Help needed moving to and from a bed to a chair (including a wheelchair)?: A Little Help needed standing up from a chair using your arms (e.g., wheelchair or bedside chair)?: A Little Help needed to walk in hospital room?: A Little Help needed climbing 3-5 steps with a railing? : A Lot 6 Click Score: 19    End of Session   Activity Tolerance: Treatment limited secondary to medical complications (Comment) (soft BP) Patient left: in bed;with call bell/phone within reach Nurse Communication: Mobility status (soft BP) PT Visit Diagnosis: Other abnormalities of gait and mobility (R26.89)    Time: 1030-1105 PT Time Calculation (min) (ACUTE ONLY): 35 min   Charges:   PT Evaluation $PT Eval Moderate Complexity: 1 Mod PT Treatments $Therapeutic Exercise: 8-22 mins        Inocencio Homes, PT, DPT   Acute Rehabilitation  Department Pager #: [phone]  Sandra Cockayne 09/12/2020, 11:17 AM

## 2020-09-12 NOTE — Progress Notes (Signed)
Patient being transported to Dialysis via bed. Report given. Patient is stable.

## 2020-09-12 NOTE — Progress Notes (Signed)
  [facility] Progress Note   Assessment/ Plan:   Dialysis Orders:  [facility] 3.5h 69kg 3K/2.5Ca  Hectorol 49mcg TIW No ESA      Assessment/Plan: Syncope- bradycardia +HB on arrival. Cardiology/EP following- per their notes no need for pacer ESRD -  HD TTS. Continue on schedule. HD today 09/12/20, no heparin Hypertension/volume  - BP/volume ok. No BP meds. Gentle UF on HD  Anemia/Rectal bleeding - +FOBT. Hgb 7.3--> 7.1, got 1 u PRBCs 09/11/20.  Hgb 7.5 this AM.  Consider GI c/s Metabolic bone disease -  Ca ok. Continue home binders/VDRA. Follow trends here.  Nutrition - Renal diet w fluid restriction  Dispo: pending  Subjective:    Transfused 1 u pRBCs yesterday.  Post transfusion Hgb was 8.6 and now down to 7.5.  No complaints this AM.      Objective:   BP 116/62 (BP Location: Right Arm)   Pulse 83   Temp 97.7 F (36.5 C) (Oral)   Resp 20   Ht 5\' 7"  (1.702 m)   Wt 67.9 kg   SpO2 94%   BMI 23.45 kg/m   Physical Exam: Gen: sitting up in bed, NAD CVS: RRR Resp:clear DGU:YQIH Ext: no LE edema ACCESS: L FA AVF  Labs: BMET Recent Labs  Lab 09/09/20 1551 09/10/20 0439 09/11/20 0033 09/12/20 0153  NA 135 138 135 134*  K 4.4 4.7 4.0 4.4  CL 91* 93* 97* 98  CO2 27 30 31 26   GLUCOSE 201* 137* 202* 186*  BUN 25* 27* 13 28*  CREATININE 5.79* 6.88* 3.77* 6.40*  CALCIUM 8.9 9.1 8.4* 8.1*   CBC Recent Labs  Lab 09/09/20 1551 09/09/20 2025 09/10/20 0439 09/10/20 1125 09/11/20 0033 09/11/20 1917 09/12/20 0153  WBC 9.3   < > 7.7 8.7 6.1  --  5.3  NEUTROABS 7.4  --   --   --   --   --   --   HGB 9.0*   < > 7.3* 8.5* 7.1* 8.6* 7.5*  HCT 27.6*   < > 22.6* 26.1* 22.2* 27.2* 23.5*  MCV 94.5   < > 93.8 95.3 94.9  --  93.3  PLT 245   < > 221 254 216  --  207   < > = values in this interval not displayed.      Medications:     sodium chloride   Intravenous Once   atorvastatin  80 mg Oral QHS   Chlorhexidine Gluconate Cloth  6 each Topical  Q0600   darbepoetin (ARANESP) injection - DIALYSIS  60 mcg Intravenous Q Sat-HD   insulin aspart  0-6 Units Subcutaneous TID WC   isosorbide dinitrate  20 mg Oral BID   multivitamin  1 tablet Oral QHS   pantoprazole  40 mg Oral Q1200   sevelamer carbonate  1,600 mg Oral TID with meals     Madelon Lips MD 09/12/2020, 8:48 AM

## 2020-09-13 LAB — CBC
HCT: 28.8 % — ABNORMAL LOW (ref 39.0–52.0)
Hemoglobin: 9.6 g/dL — ABNORMAL LOW (ref 13.0–17.0)
MCH: 30.6 pg (ref 26.0–34.0)
MCHC: 33.3 g/dL (ref 30.0–36.0)
MCV: 91.7 fL (ref 80.0–100.0)
Platelets: 229 10*3/uL (ref 150–400)
RBC: 3.14 MIL/uL — ABNORMAL LOW (ref 4.22–5.81)
RDW: 16.7 % — ABNORMAL HIGH (ref 11.5–15.5)
WBC: 8.2 10*3/uL (ref 4.0–10.5)
nRBC: 0 % (ref 0.0–0.2)

## 2020-09-13 LAB — GLUCOSE, CAPILLARY
Glucose-Capillary: 121 mg/dL — ABNORMAL HIGH (ref 70–99)
Glucose-Capillary: 240 mg/dL — ABNORMAL HIGH (ref 70–99)

## 2020-09-13 LAB — HEPATITIS B SURFACE ANTIGEN: Hepatitis B Surface Ag: NONREACTIVE

## 2020-09-13 LAB — HEPATITIS B CORE ANTIBODY, TOTAL: Hep B Core Total Ab: NONREACTIVE

## 2020-09-13 MED ORDER — INSULIN NPH ISOPHANE & REGULAR (70-30) 100 UNIT/ML ~~LOC~~ SUSP: 10.0000 [IU] | Freq: Every day | SUBCUTANEOUS | 0 refills | Status: AC

## 2020-09-13 MED ORDER — CLOPIDOGREL BISULFATE 75 MG PO TABS: 75.0000 mg | ORAL_TABLET | Freq: Every day | ORAL | 3 refills | Status: AC

## 2020-09-13 NOTE — Progress Notes (Signed)
  [facility] Progress Note   Assessment/ Plan:   Dialysis Orders:  [facility] 3.5h 69kg 3K/2.5Ca  Hectorol 58mcg TIW No ESA      Assessment/Plan: Syncope- bradycardia +HB on arrival. Cardiology/EP following- per their notes no need for pacer ESRD -  HD TTS. Continue on schedule. HD 09/12/20, no heparin Hypertension/volume  - BP/volume ok. No BP meds. Gentle UF on HD  Anemia/Rectal bleeding - +FOBT. Hgb 7.3--> 7.1, got 1 u PRBCs 09/11/20.  Hgb 7.5--> 8.2.  Stable, h/o diverticular bleed Metabolic bone disease -  Ca ok. Continue home binders/VDRA. Follow trends here.  Nutrition - Renal diet w fluid restriction  Dispo: OK to d/c from renal perspective.  Subjective:    Hgb stable.  Feeling OK.  HD yesterday without incident.     Objective:   BP (!) 116/58   Pulse 84   Temp 97.8 F (36.6 C) (Oral)   Resp 20   Ht 5\' 7"  (1.702 m)   Wt 67.6 kg   SpO2 100%   BMI 23.34 kg/m   Physical Exam: Gen: sitting up in bed, NAD CVS: RRR Resp:clear JIR:CVEL Ext: no LE edema ACCESS: L FA AVF  Labs: BMET Recent Labs  Lab 09/09/20 1551 09/10/20 0439 09/11/20 0033 09/12/20 0153  NA 135 138 135 134*  K 4.4 4.7 4.0 4.4  CL 91* 93* 97* 98  CO2 27 30 31 26   GLUCOSE 201* 137* 202* 186*  BUN 25* 27* 13 28*  CREATININE 5.79* 6.88* 3.77* 6.40*  CALCIUM 8.9 9.1 8.4* 8.1*   CBC Recent Labs  Lab 09/09/20 1551 09/09/20 2025 09/11/20 0033 09/11/20 1917 09/12/20 0153 09/12/20 1159 09/13/20 0108  WBC 9.3   < > 6.1  --  5.3 6.0 8.2  NEUTROABS 7.4  --   --   --   --   --   --   HGB 9.0*   < > 7.1* 8.6* 7.5* 8.2* 9.6*  HCT 27.6*   < > 22.2* 27.2* 23.5* 24.7* 28.8*  MCV 94.5   < > 94.9  --  93.3 93.2 91.7  PLT 245   < > 216  --  207 211 229   < > = values in this interval not displayed.      Medications:     sodium chloride   Intravenous Once   atorvastatin  80 mg Oral QHS   Chlorhexidine Gluconate Cloth  6 each Topical Q0600   darbepoetin (ARANESP)  injection - DIALYSIS  60 mcg Intravenous Q Sat-HD   insulin aspart  0-6 Units Subcutaneous TID WC   isosorbide dinitrate  20 mg Oral BID   midodrine  5 mg Oral BID WC   multivitamin  1 tablet Oral QHS   pantoprazole  40 mg Oral Q1200   sevelamer carbonate  1,600 mg Oral TID with meals     Madelon Lips MD 09/13/2020, 9:56 AM

## 2020-09-13 NOTE — Progress Notes (Addendum)
Received referral for DME (RW). Contacted [facility] for DME referral. Referral accepted by Jazmine.  Addendum: Received call from [place]. She reports that pt received a rollator in 2020 and his insurance won't pay for another one. Insurance pays for walker every 5 years. Met with pt and wife. He reports that has a rollator, but he likes the RW  better. He reports that he is going to get a RW from the [place].

## 2020-09-13 NOTE — Progress Notes (Signed)
Patient refused 0700 midodrine dose stating that he wanted to discuss the medication with MD first prior to taking another dose. Patient had the strong urge to urinate after taking the medication, which was uncomfortable because he is anuric. Patient is unsure if this is related to the medication and would like to speak with doctor regarding concern.

## 2020-09-13 NOTE — Progress Notes (Signed)
Patient back from dialysis. 1L removed. Patient in room and stable

## 2020-09-14 LAB — HEPATITIS B SURFACE ANTIBODY, QUANTITATIVE: Hep B S AB Quant (Post): 36.8 m[IU]/mL (ref >9.9)

## 2020-09-14 NOTE — Telephone Encounter (Signed)
Nicholas [name]  noted thx and for calling him

## 2020-09-15 NOTE — Discharge Summary (Signed)
Physician Discharge Summary  Nicholas [name] URK:[id number] DOB: [birth year] DOA: 09/09/2020  PCP: Pcp, No  Admit date: 09/09/2020 Discharge date: 09/13/2020  Time spent:35 minutes  Recommendations for Outpatient Follow-up:  EP Dr. Quentin Ore in 1 to 2 months PCP in 1 week, please check CBC at follow-up  Discharge Diagnoses:  Principal Problem:   Syncope Transient high degree AV block   ESRD (end stage renal disease) on dialysis [facility])   GIB (gastrointestinal bleeding)   Type II diabetes mellitus with renal manifestations (HCC)   Acute GI bleeding   Bradycardia   Discharge Condition: Stable  Diet recommendation: Renal, diabetic  Filed Weights   09/12/20 2045 09/12/20 2355 09/13/20 0601  Weight: 68.9 kg 68.4 kg 67.6 kg    History of present illness:  87 year old male with history of CAD, PCI/stenting in 07/2018, ESRD on hemodialysis, type 2 diabetes mellitus was brought to the ED following a syncopal event. -On 8/16 patient had an episode of moderate hematochezia followed by another stool with scant blood.  On 8/17 he had normal BMs, went for hemodialysis, was back home went for a walk, and after returning to the house he had a syncopal event.  Was taken to [place] where he had an episode of bradycardia with heart rates dropping to the 40s and had a run of high-grade AV block.  Cardiology was consulted and he was transferred to [facility] Course:   Syncope Bradycardia, transient high-grade AV block -Cardiology consulted, he is not on any AV blockers -ILR interrogated, no other episodes noted -EP consulted after much discussion it was determined that he does not need to PPM at this time -Will need close follow-up with EP   ESRD on hemodialysis -Dialyzed yesterday   Hematochezia on 8/16 -Had 2 episodes of bleeding on 8/16, scant episode yesterday 8/18 a.m., none in the last 48 hours -Hospitalized in June for same, EGD was unremarkable, colonoscopy noted  diverticulosis which is likely the source again -Held Plavix, hemoglobin down to 7.1 this admission, transfused 1 unit of PRBC -Anemia panel suggestive of chronic disease -Subsequently improved and stable, advised to resume Plavix after 5 days -Needs CBC check in 1 week after HD   Acute blood loss anemia  Anemia of chronic disease -Hematochezia on 8/16, none since then -Continue EPO with HD -Transfused with 1 unit of PRBC this admission, see discussion above   Type 2 diabetes mellitus -CBGs are stable, monitor -Insulin 70/30 resumed at lower dose  Discharge Exam: Vitals:   09/13/20 0700 09/13/20 1100  BP: (!) 116/58 (!) 105/54  Pulse: 84 83  Resp: 20 20  Temp: 97.8 F (36.6 C) 97.9 F (36.6 C)  SpO2: 100% 98%   Gen: Awake, Alert, Oriented X 3,  HEENT: no JVD Lungs: Good air movement bilaterally, CTAB CVS: S1S2/RRR Abd: soft, Non tender, non distended, BS present Extremities: No edema Skin: no new rashes on exposed skin   Discharge Instructions   Discharge Instructions     Discharge instructions   Complete by: As directed    Renal Diet   Increase activity slowly   Complete by: As directed    No wound care   Complete by: As directed       Allergies as of 09/13/2020       Reactions   Hydrochlorothiazide Other (See Comments)   Unknown [facility] records        Medication List     STOP taking these medications  isosorbide dinitrate 20 MG tablet Commonly known as: ISORDIL   methocarbamol 500 MG tablet Commonly known as: ROBAXIN       TAKE these medications    acetaminophen 500 MG tablet Commonly known as: TYLENOL Take 500 mg by mouth 3 (three) times daily as needed for moderate pain.   [employer] Digihaler 232 MCG/ACT Aepb Generic drug: Fluticasone Propionate(sensor) Inhale 1 puff into the lungs 2 (two) times daily.   atorvastatin 80 MG tablet Commonly known as: LIPITOR Take 80 mg by mouth at bedtime.   clopidogrel 75 MG tablet Commonly  known as: PLAVIX Take 1 tablet (75 mg total) by mouth daily with breakfast. Restart after 5 days Start taking on: September 18, 2020 What changed:  additional instructions These instructions start on September 18, 2020. If you are unsure what to do until then, ask your doctor or other care provider.   insulin NPH-regular Human (70-30) 100 UNIT/ML injection Inject 10 Units into the skin daily with breakfast. Takes 20 units in the morning if blood sugar is over 140 What changed:  how much to take when to take this   multivitamin with minerals Tabs tablet Take 1 tablet by mouth daily.   NEPRO/CARBSTEADY PO Take 237 mLs by mouth daily. vanilla   nitroGLYCERIN 0.4 MG SL tablet Commonly known as: NITROSTAT Place 1 tablet (0.4 mg total) under the tongue every 5 (five) minutes x 3 doses as needed for chest pain.   sevelamer carbonate 800 MG tablet Commonly known as: RENVELA Take 1,600 mg by mouth 3 (three) times daily.       Allergies  Allergen Reactions   Hydrochlorothiazide Other (See Comments)    Unknown per [facility] records    Follow-up Information     Shirley Friar, PA-C Follow up.   Specialty: Physician Assistant Why: 09/24/20 @ 11;20AM (cardiology follow up fpr Dr. Caryl Comes) Contact information: [place] [address] [phone]                  The results of significant diagnostics from this hospitalization (including imaging, microbiology, ancillary and laboratory) are listed below for reference.    Significant Diagnostic Studies: DG Chest Portable 1 View  Result Date: 09/09/2020 CLINICAL DATA:  Syncope, CHF EXAM: PORTABLE CHEST 1 VIEW COMPARISON:  Chest radiograph 07/06/2020 FINDINGS: The cardiomediastinal silhouette is stable. A cardiac loop recorder is again seen. There is a small to medium size right pleural effusion with adjacent airspace disease, similar to the prior study. The right upper lung is well aerated. The left lung is  clear. There is no left effusion. There is no pneumothorax. The bones are stable. IMPRESSION: No significant interval change in size of the small to medium right pleural effusion with adjacent airspace disease since 07/06/2020. Electronically Signed   By: Valetta Mole M.D.   On: 09/09/2020 15:54   CUP PACEART REMOTE DEVICE CHECK  Result Date: 08/25/2020 ILR summary report received. Battery status OK. Normal device function. No new tachy, brady, or pause episodes. No new AF episodes. 1 new symptom, EGM SR. Monthly summary reports and ROV/PRN   Microbiology: Recent Results (from the past 240 hour(s))  SARS CORONAVIRUS 2 (TAT 6-24 HRS) Nasopharyngeal Nasopharyngeal Swab     Status: None   Collection Time: 09/09/20  5:27 PM   Specimen: Nasopharyngeal Swab  Result Value Ref Range Status   SARS Coronavirus 2 NEGATIVE NEGATIVE Final    Comment: (NOTE) SARS-CoV-2 target nucleic acids are NOT DETECTED.  The SARS-CoV-2 RNA is generally detectable in upper and lower respiratory specimens during the acute phase of infection. Negative results do not preclude SARS-CoV-2 infection, do not rule out co-infections with other pathogens, and should not be used as the sole basis for treatment or other patient management decisions. Negative results must be combined with clinical observations, patient history, and epidemiological information. The expected result is Negative.  Fact Sheet for Patients: SugarRoll.be  Fact Sheet for Healthcare Providers: https://www.woods-mathews.com/  This test is not yet approved or cleared by the [place] FDA and  has been authorized for detection and/or diagnosis of SARS-CoV-2 by FDA under an Emergency Use Authorization (EUA). This EUA will remain  in effect (meaning this test can be used) for the duration of the COVID-19 declaration under Se ction 564(b)(1) of the Act, 21 U.S.C. section 360bbb-3(b)(1), unless the  authorization is terminated or revoked sooner.  Performed at [facility], [place] [address], [place], [place] [address]   MRSA Next Gen by PCR, Nasal     Status: None   Collection Time: 09/09/20  7:55 PM   Specimen: Nasal Mucosa; Nasal Swab  Result Value Ref Range Status   MRSA by PCR Next Gen NOT DETECTED NOT DETECTED Final    Comment: (NOTE) The GeneXpert MRSA Assay (FDA approved for NASAL specimens only), is one component of a comprehensive MRSA colonization surveillance program. It is not intended to diagnose MRSA infection nor to guide or monitor treatment for MRSA infections. Test performance is not FDA approved in patients less than 23 years old. Performed at [facility], [place] [address] [place], [place] [address]      Labs: Basic Metabolic Panel: Recent Labs  Lab 09/09/20 1551 09/10/20 0439 09/10/20 1125 09/11/20 0033 09/12/20 0153  NA 135 138  --  135 134*  K 4.4 4.7  --  4.0 4.4  CL 91* 93*  --  97* 98  CO2 27 30  --  31 26  GLUCOSE 201* 137*  --  202* 186*  BUN 25* 27*  --  13 28*  CREATININE 5.79* 6.88*  --  3.77* 6.40*  CALCIUM 8.9 9.1  --  8.4* 8.1*  MG 2.2  --  2.0  --   --    Liver Function Tests: Recent Labs  Lab 09/09/20 1551 09/10/20 0439  AST 18 15  ALT 22 17  ALKPHOS 85 63  BILITOT 0.3 0.4  PROT 7.7 5.7*  ALBUMIN 3.3* 2.5*   No results for input(s): LIPASE, AMYLASE in the last 168 hours. No results for input(s): AMMONIA in the last 168 hours. CBC: Recent Labs  Lab 09/09/20 1551 09/09/20 2025 09/10/20 1125 09/11/20 0033 09/11/20 1917 09/12/20 0153 09/12/20 1159 09/13/20 0108  WBC 9.3   < > 8.7 6.1  --  5.3 6.0 8.2  NEUTROABS 7.4  --   --   --   --   --   --   --   HGB 9.0*   < > 8.5* 7.1* 8.6* 7.5* 8.2* 9.6*  HCT 27.6*   < > 26.1* 22.2* 27.2* 23.5* 24.7* 28.8*  MCV 94.5   < > 95.3 94.9  --  93.3 93.2 91.7  PLT 245   < > 254 216  --  207 211 229   < > = values in this interval not displayed.   Cardiac Enzymes: No  results for input(s): CKTOTAL, CKMB, CKMBINDEX, TROPONINI in the last 168 hours. BNP: BNP (last 3 results)  Recent Labs    06/04/20 1650  BNP 268.2*    ProBNP (last 3 results) No results for input(s): PROBNP in the last 8760 hours.  CBG: Recent Labs  Lab 09/12/20 1133 09/12/20 1532 09/12/20 1954 09/13/20 0553 09/13/20 1113  GLUCAP 177* 246* 163* 121* 240*       Signed:  Domenic Polite MD.  [facility] 09/15/2020, 2:20 PM

## 2020-09-22 ENCOUNTER — Encounter: Payer: Self-pay | Admitting: Student

## 2020-09-22 NOTE — Progress Notes (Signed)
[facility] Summary Report / Loop Recorder 

## 2020-09-22 NOTE — Telephone Encounter (Signed)
error 

## 2020-09-24 ENCOUNTER — Encounter: Payer: Medicare Other | Admitting: Student

## 2020-09-29 ENCOUNTER — Telehealth: Payer: Self-pay

## 2020-09-29 ENCOUNTER — Ambulatory Visit (INDEPENDENT_AMBULATORY_CARE_PROVIDER_SITE_OTHER): Payer: Medicare Other

## 2020-09-29 DIAGNOSIS — R55 Syncope and collapse: Secondary | ICD-10-CM

## 2020-09-29 NOTE — Telephone Encounter (Signed)
ILR alert report received. Battery status OK. Normal device function. No new pause episodes. No new AF episodes. There were five symptom events detected that appear to be SR.  There was one true brady episode during waking hours and two true tachycadia episodes, sent to triage.     Attempted to contact patient to assess. No answer, LMTCB.

## 2020-09-30 DIAGNOSIS — B351 Tinea unguium: Secondary | ICD-10-CM | POA: Diagnosis not present

## 2020-09-30 DIAGNOSIS — L84 Corns and callosities: Secondary | ICD-10-CM | POA: Diagnosis not present

## 2020-09-30 DIAGNOSIS — E1159 Type 2 diabetes mellitus with other circulatory complications: Secondary | ICD-10-CM | POA: Diagnosis not present

## 2020-09-30 LAB — CUP PACEART REMOTE DEVICE CHECK
Date Time Interrogation Session: 20220904000858
Implantable Pulse Generator Implant Date: 20210412

## 2020-10-01 NOTE — Progress Notes (Signed)
Electrophysiology Office Note Date: 10/02/2020  ID:  Nicholas [name], DOB 04-21-1930, MRN [id number]  PCP: Windy Fast, MD Primary Cardiologist: Sinclair Grooms, MD Electrophysiologist: Virl Axe, MD   CC: ILR follow-up  Nicholas [name] is a 87 y.o. male seen today for Dr. Caryl Comes . he presents today for post ED electrophysiology followup.  Since discharge from the hospital, the patient reports doing very well.  Admitted in 8/18 - 8/21. He had syncope after HD on 8/17. He was noted to have intermittent AV block and bradycardia on tele when he presented to ED in the setting of nausea and vomiting. Asymptomatic at the time of slow HRs. ILR was adjusted to more bradycardia. He was noted to have 2:1 AV block at one point with sinus slowing as well, further indicative of vagal etiology, not an indication for pacing.   He is doing well today. No further syncope or near syncope. He has HD T/Th/Saturday. he denies chest pain, palpitations, dyspnea, PND, orthopnea, nausea, vomiting, dizziness, syncope, edema, weight gain, or early satiety.  Device History: Medtronic loop recorder implanted 04/2019 for syncope  Past Medical History:  Diagnosis Date   Arthritis    Chronic kidney disease    Diabetes mellitus ([place])    Dialysis patient ([place])    GERD (gastroesophageal reflux disease)    occ   Heart murmur    Hypercholesteremia    Hypertension    Psoriasis    Past Surgical History:  Procedure Laterality Date   CARDIAC CATHETERIZATION  09   "some blockage" with collaterals by cath at [facility] ~ 2009; no intervention required; reportedly, no routine cardiology f/u recommended    COLONOSCOPY WITH PROPOFOL N/A 07/06/2020   Procedure: COLONOSCOPY WITH PROPOFOL;  Surgeon: Ronald Lobo, MD;  Location: [place];  Service: Endoscopy;  Laterality: N/A;   CORONARY ATHERECTOMY N/A 08/09/2018   Procedure: CORONARY ATHERECTOMY;  Surgeon: Jettie Booze, MD;  Location: [place] [facility];  Service:  Cardiovascular;  Laterality: N/A;   CORONARY STENT INTERVENTION N/A 08/09/2018   Procedure: CORONARY STENT INTERVENTION;  Surgeon: Jettie Booze, MD;  Location: [place] [facility];  Service: Cardiovascular;  Laterality: N/A;   ESOPHAGOGASTRODUODENOSCOPY (EGD) WITH PROPOFOL N/A 07/06/2020   Procedure: ESOPHAGOGASTRODUODENOSCOPY (EGD) WITH PROPOFOL;  Surgeon: Ronald Lobo, MD;  Location: [place];  Service: Endoscopy;  Laterality: N/A;   INGUINAL HERNIA REPAIR Right 04/13/2012   Procedure: HERNIA REPAIR INGUINAL INCARCERATED;  Surgeon: Gayland Curry, MD;  Location: [place];  Service: General;  Laterality: Right;   INSERTION OF MESH Right 04/13/2012   Procedure: INSERTION OF MESH;  Surgeon: Gayland Curry, MD;  Location: [place];  Service: General;  Laterality: Right;   IR THORACENTESIS ASP PLEURAL SPACE W/IMG GUIDE  06/08/2020   LEFT HEART CATH N/A 08/09/2018   Procedure: Left Heart Cath;  Surgeon: Jettie Booze, MD;  Location: [place] [facility];  Service: Cardiovascular;  Laterality: N/A;   LEFT HEART CATH AND CORONARY ANGIOGRAPHY N/A 08/08/2018   Procedure: LEFT HEART CATH AND CORONARY ANGIOGRAPHY;  Surgeon: Troy Sine, MD;  Location: [place] [facility];  Service: Cardiovascular;  Laterality: N/A;   LEFT HEART CATH AND CORONARY ANGIOGRAPHY N/A 06/05/2020   Procedure: LEFT HEART CATH AND CORONARY ANGIOGRAPHY;  Surgeon: Belva Crome, MD;  Location: [place] [facility];  Service: Cardiovascular;  Laterality: N/A;   VASECTOMY      Current Outpatient Medications  Medication Sig Dispense Refill   acetaminophen (TYLENOL) 500  MG tablet Take 500 mg by mouth 3 (three) times daily as needed for moderate pain.     atorvastatin (LIPITOR) 80 MG tablet Take 80 mg by mouth at bedtime.     cinacalcet (SENSIPAR) 30 MG tablet Take by mouth 3 (three) times a week. Nicholas [name], Sat     clopidogrel (PLAVIX) 75 MG tablet Take 1 tablet (75 mg total) by mouth daily with breakfast. Restart after 5 days  30 tablet 3   Fluticasone Propionate,sensor, (ARMONAIR DIGIHALER) 232 MCG/ACT AEPB Inhale 1 puff into the lungs 2 (two) times daily.     insulin NPH-regular Human (70-30) 100 UNIT/ML injection Inject 10 Units into the skin daily with breakfast. Takes 20 units in the morning if blood sugar is over 140 1 mL 0   isosorbide dinitrate (ISORDIL) 20 MG tablet Take 20 mg by mouth 2 (two) times daily.     Multiple Vitamin (MULTIVITAMIN WITH MINERALS) TABS tablet Take 1 tablet by mouth daily.     nitroGLYCERIN (NITROSTAT) 0.4 MG SL tablet Place 1 tablet (0.4 mg total) under the tongue every 5 (five) minutes x 3 doses as needed for chest pain. 30 tablet 1   Nutritional Supplements (NEPRO/CARBSTEADY PO) Take 237 mLs by mouth daily. vanilla     sevelamer carbonate (RENVELA) 800 MG tablet Take 1,600 mg by mouth 3 (three) times daily.     No current facility-administered medications for this visit.    Allergies:   Hydrochlorothiazide   Social History: Social History   Socioeconomic History   Marital status: Married    Spouse name: Not on file   Number of children: Not on file   Years of education: Not on file   Highest education level: Not on file  Occupational History   Not on file  Tobacco Use   Smoking status: Former    Packs/day: 1.00    Years: 15.00    Pack years: 15.00    Types: Cigarettes    Quit date: 03/29/1987    Years since quitting: 33.5   Smokeless tobacco: Never  Substance and Sexual Activity   Alcohol use: No   Drug use: No   Sexual activity: Not on file  Other Topics Concern   Not on file  Social History Narrative   Not on file   Social Determinants of Health   Financial Resource Strain: Not on file  Food Insecurity: Not on file  Transportation Needs: Not on file  Physical Activity: Not on file  Stress: Not on file  Social Connections: Not on file  Intimate Partner Violence: Not on file    Family History: Family History  Problem Relation Age of Onset   Diabetes  Mother    Stroke Father    Diabetes Sister    Diabetes Brother      Review of Systems: All other systems reviewed and are otherwise negative except as noted above.  Physical Exam: Vitals:   10/02/20 0928  BP: 110/62  Pulse: 80  Weight: 151 lb (68.5 kg)  Height: 5\' 8"  (1.727 m)     GEN- The patient is well appearing, alert and oriented x 3 today.   HEENT: normocephalic, atraumatic; sclera clear, conjunctiva pink; hearing intact; oropharynx clear; neck supple  Lungs- Clear to ausculation bilaterally, normal work of breathing.  No wheezes, rales, rhonchi Heart- Regular rate and rhythm, no murmurs, rubs or gallops  GI- soft, non-tender, non-distended, bowel sounds present  Extremities- no clubbing, cyanosis, or edema  MS- no significant deformity or  atrophy Skin- warm and dry, no rash or lesion; PPM pocket well healed Psych- euthymic mood, full affect Neuro- strength and sensation are intact  PPM Interrogation- reviewed in detail today,  See PACEART report  EKG:  EKG is not ordered today.  Recent Labs: 06/04/2020: B Natriuretic Peptide 268.2 09/09/2020: TSH 4.302 09/10/2020: ALT 17; Magnesium 2.0 09/12/2020: BUN 28; Creatinine, Ser 6.40; Potassium 4.4; Sodium 134 09/13/2020: Hemoglobin 9.6; Platelets 229   Wt Readings from Last 3 Encounters:  10/02/20 151 lb (68.5 kg)  09/13/20 149 lb 0.5 oz (67.6 kg)  09/02/20 155 lb 6.4 oz (70.5 kg)     Other studies Reviewed: Additional studies/ records that were reviewed today include: Echo 06/2020 shows LVEF 60-65%, Previous EP office notes, Previous remote checks, Most recent labwork.   Assessment and Plan:  1. Syncope s/p Medtronic Loop recorder Normal device function See Pace Art report No changes today He had transient CHB by tele in the ED, but was asymptomatic (also supine at the time). Just prior to this he did have nausea and vomiting.   2. Intermittent advanced AV block, thought vagally mediated By device he did have  bradycardia with AV block on 9/3 at 1330. Appears either 2:1 or 3:1, possibly even CHB though several P-R intervals are consistent with his preceding conducted beats.  This would've been several hours in to HD. He was asymptomatic. It does appear to show at least several P-P intervals that are longer than their preceding beats, remaining more consistent with vagal involvement.  In shared decision making, patient wants to try and avoid PPM if at all possible. .   Current medicines are reviewed at length with the patient today.   The patient does not have concerns regarding his medicines.  The following changes were made today:  none  Disposition:   Follow up with Dr. Caryl Comes in 4 Months    Signed, Annamaria Helling  10/02/2020 9:43 AM  [facility] [address] [place] [address] [phone] (office) [phone] (fax)

## 2020-10-02 ENCOUNTER — Ambulatory Visit (INDEPENDENT_AMBULATORY_CARE_PROVIDER_SITE_OTHER): Payer: Medicare Other | Admitting: Student

## 2020-10-02 ENCOUNTER — Encounter: Payer: Self-pay | Admitting: Student

## 2020-10-02 ENCOUNTER — Encounter (INDEPENDENT_AMBULATORY_CARE_PROVIDER_SITE_OTHER): Payer: Self-pay

## 2020-10-02 ENCOUNTER — Other Ambulatory Visit: Payer: Self-pay

## 2020-10-02 VITALS — BP 110/62 | HR 80 | Ht 68.0 in | Wt 151.0 lb

## 2020-10-02 DIAGNOSIS — I442 Atrioventricular block, complete: Secondary | ICD-10-CM

## 2020-10-02 DIAGNOSIS — R55 Syncope and collapse: Secondary | ICD-10-CM

## 2020-10-02 DIAGNOSIS — I25110 Atherosclerotic heart disease of native coronary artery with unstable angina pectoris: Secondary | ICD-10-CM | POA: Diagnosis not present

## 2020-10-02 NOTE — Patient Instructions (Signed)
Medication Instructions:  Your physician recommends that you continue on your current medications as directed. Please refer to the Current Medication list given to you today.  *If you need a refill on your cardiac medications before your next appointment, please call your pharmacy*   Lab Work: None If you have labs (blood work) drawn today and your tests are completely normal, you will receive your results only by: [place] (if you have MyChart) OR A paper copy in the mail If you have any lab test that is abnormal or we need to change your treatment, we will call you to review the results.   Follow-Up: At [facility], you and your health needs are our priority.  As part of our continuing mission to provide you with exceptional heart care, we have created designated Provider Care Teams.  These Care Teams include your primary Cardiologist (physician) and Advanced Practice Providers (APPs -  Physician Assistants and Nurse Practitioners) who all work together to provide you with the care you need, when you need it.  We recommend signing up for the patient portal called "MyChart".  Sign up information is provided on this After Visit Summary.  MyChart is used to connect with patients for Virtual Visits (Telemedicine).  Patients are able to view lab/test results, encounter notes, upcoming appointments, etc.  Non-urgent messages can be sent to your provider as well.   To learn more about what you can do with MyChart, go to [email or web address].    Your next appointment:   02/01/2021 with Dr Caryl Comes

## 2020-10-02 NOTE — Telephone Encounter (Signed)
Attempted to contact patient. No answer,LMTCB 

## 2020-10-07 NOTE — Telephone Encounter (Signed)
Patient had in-clinic apt. With A. Tillery 10/02/20. Events were noted before. Patient appreciative of follow up.

## 2020-10-08 NOTE — Progress Notes (Signed)
[facility] Summary Report / Loop Recorder 

## 2020-11-02 ENCOUNTER — Ambulatory Visit (INDEPENDENT_AMBULATORY_CARE_PROVIDER_SITE_OTHER): Payer: Medicare Other

## 2020-11-02 DIAGNOSIS — R55 Syncope and collapse: Secondary | ICD-10-CM

## 2020-11-05 LAB — CUP PACEART REMOTE DEVICE CHECK
Date Time Interrogation Session: 20221013081350
Implantable Pulse Generator Implant Date: 20210412

## 2020-11-11 NOTE — Progress Notes (Signed)
[facility] Summary Report / Loop Recorder 

## 2020-11-13 DIAGNOSIS — R0902 Hypoxemia: Secondary | ICD-10-CM | POA: Diagnosis not present

## 2020-11-13 DIAGNOSIS — J90 Pleural effusion, not elsewhere classified: Secondary | ICD-10-CM | POA: Diagnosis not present

## 2020-12-02 DIAGNOSIS — B351 Tinea unguium: Secondary | ICD-10-CM | POA: Diagnosis not present

## 2020-12-02 DIAGNOSIS — E1159 Type 2 diabetes mellitus with other circulatory complications: Secondary | ICD-10-CM | POA: Diagnosis not present

## 2020-12-02 DIAGNOSIS — L84 Corns and callosities: Secondary | ICD-10-CM | POA: Diagnosis not present

## 2020-12-03 LAB — CUP PACEART REMOTE DEVICE CHECK
Date Time Interrogation Session: 20221108230132
Implantable Pulse Generator Implant Date: 20210412

## 2020-12-04 DIAGNOSIS — K219 Gastro-esophageal reflux disease without esophagitis: Secondary | ICD-10-CM | POA: Diagnosis not present

## 2020-12-04 DIAGNOSIS — K5790 Diverticulosis of intestine, part unspecified, without perforation or abscess without bleeding: Secondary | ICD-10-CM | POA: Diagnosis not present

## 2020-12-07 ENCOUNTER — Ambulatory Visit (INDEPENDENT_AMBULATORY_CARE_PROVIDER_SITE_OTHER): Payer: Medicare Other

## 2020-12-07 DIAGNOSIS — R55 Syncope and collapse: Secondary | ICD-10-CM | POA: Diagnosis not present

## 2020-12-15 NOTE — Progress Notes (Signed)
[facility] Summary Report / Loop Recorder 

## 2021-01-11 ENCOUNTER — Ambulatory Visit (INDEPENDENT_AMBULATORY_CARE_PROVIDER_SITE_OTHER): Payer: Medicare Other

## 2021-01-11 DIAGNOSIS — R55 Syncope and collapse: Secondary | ICD-10-CM | POA: Diagnosis not present

## 2021-01-12 LAB — CUP PACEART REMOTE DEVICE CHECK
Date Time Interrogation Session: 20221211230438
Implantable Pulse Generator Implant Date: 20210412

## 2021-01-20 NOTE — Progress Notes (Signed)
[facility] Summary Report / Loop Recorder 

## 2021-01-28 DIAGNOSIS — Z9889 Other specified postprocedural states: Secondary | ICD-10-CM | POA: Insufficient documentation

## 2021-02-01 ENCOUNTER — Ambulatory Visit (INDEPENDENT_AMBULATORY_CARE_PROVIDER_SITE_OTHER): Payer: Medicare Other | Admitting: Internal Medicine

## 2021-02-01 ENCOUNTER — Encounter: Payer: Self-pay | Admitting: Internal Medicine

## 2021-02-01 ENCOUNTER — Other Ambulatory Visit: Payer: Self-pay

## 2021-02-01 VITALS — BP 126/80 | HR 82 | Ht 68.0 in | Wt 158.4 lb

## 2021-02-01 DIAGNOSIS — I442 Atrioventricular block, complete: Secondary | ICD-10-CM | POA: Diagnosis not present

## 2021-02-01 DIAGNOSIS — R55 Syncope and collapse: Secondary | ICD-10-CM | POA: Diagnosis not present

## 2021-02-01 DIAGNOSIS — Z9889 Other specified postprocedural states: Secondary | ICD-10-CM | POA: Diagnosis not present

## 2021-02-01 NOTE — Progress Notes (Signed)
Patient Care Team: Windy Fast, MD as PCP - General (Internal Medicine) Belva Crome, MD as PCP - Cardiology (Cardiology) Deboraha Sprang, MD as PCP - Electrophysiology (Cardiology)   HPI  Nicholas [name] is a 88 y.o. male seen in follow-up for recurrent syncope in the context of end-stage renal disease with a recently implanted loop recorder  2021  Intercurrent syncopal episode 8/21 with intermittent AV block and sinus bradycardia on telemetry in the context of nausea and vomiting  consistent with hyper vagotonia  History of ischemic heart disease presenting 2020 with non-STEMI and LAD disease.  Underwent PCI.  Course complicated by diverticular bleed and aspirin discontinued  History of orthostasis postdialysis  Today, he is on supplemental oxygen and using a wheel chair. Overall, he has been doing well. He endorses undergoing dialysis since December 2018. He occasionally feels dizziness during his dialysis. He has had 2 past episodes of syncope. However, these episodes have become increasingly less frequent. The longest episode lasted a few minutes. Typically, these episodes last about a minute.    The patient denies, nocturnal dyspnea, orthopnea or peripheral edema.  There have been no palpitations.  Complains of dizziness, syncope, and shortness of breath which is variable and can be quite limiting.  He also has significant COPD which is oxygen dependent  Angina decubitus was markedly relieved by the use of nocturnal nitrates  DATE TEST EF   7//20 Echo  35-40%   2/21 Echo 60-65 % Grade 1 diastolic dysfunction  2/83 LHC 40-60 % LM 60; LADst 30; CXp 80 RCA-T L>>R collaterals  6/22 Echo  60-65 % Grade 1 diastolic dysfunction      Date Cr K Hgb  8/22 5.79 4.4 9.6        Records and Results Reviewed  Past Medical History:  Diagnosis Date   Arthritis    Chronic kidney disease    Diabetes mellitus ([place])    Dialysis patient ([place])    GERD (gastroesophageal reflux  disease)    occ   Heart murmur    Hypercholesteremia    Hypertension    Psoriasis     Past Surgical History:  Procedure Laterality Date   CARDIAC CATHETERIZATION  09   "some blockage" with collaterals by cath at [facility] ~ 2009; no intervention required; reportedly, no routine cardiology f/u recommended    COLONOSCOPY WITH PROPOFOL N/A 07/06/2020   Procedure: COLONOSCOPY WITH PROPOFOL;  Surgeon: Ronald Lobo, MD;  Location: [place];  Service: Endoscopy;  Laterality: N/A;   CORONARY ATHERECTOMY N/A 08/09/2018   Procedure: CORONARY ATHERECTOMY;  Surgeon: Jettie Booze, MD;  Location: [place] [facility];  Service: Cardiovascular;  Laterality: N/A;   CORONARY STENT INTERVENTION N/A 08/09/2018   Procedure: CORONARY STENT INTERVENTION;  Surgeon: Jettie Booze, MD;  Location: [place] [facility];  Service: Cardiovascular;  Laterality: N/A;   ESOPHAGOGASTRODUODENOSCOPY (EGD) WITH PROPOFOL N/A 07/06/2020   Procedure: ESOPHAGOGASTRODUODENOSCOPY (EGD) WITH PROPOFOL;  Surgeon: Ronald Lobo, MD;  Location: [place];  Service: Endoscopy;  Laterality: N/A;   INGUINAL HERNIA REPAIR Right 04/13/2012   Procedure: HERNIA REPAIR INGUINAL INCARCERATED;  Surgeon: Gayland Curry, MD;  Location: [place];  Service: General;  Laterality: Right;   INSERTION OF MESH Right 04/13/2012   Procedure: INSERTION OF MESH;  Surgeon: Gayland Curry, MD;  Location: [place];  Service: General;  Laterality: Right;   IR THORACENTESIS ASP PLEURAL SPACE W/IMG GUIDE  06/08/2020   LEFT HEART CATH N/A 08/09/2018  Procedure: Left Heart Cath;  Surgeon: Jettie Booze, MD;  Location: [place] [facility];  Service: Cardiovascular;  Laterality: N/A;   LEFT HEART CATH AND CORONARY ANGIOGRAPHY N/A 08/08/2018   Procedure: LEFT HEART CATH AND CORONARY ANGIOGRAPHY;  Surgeon: Troy Sine, MD;  Location: [place] [facility];  Service: Cardiovascular;  Laterality: N/A;   LEFT HEART CATH AND CORONARY ANGIOGRAPHY N/A 06/05/2020    Procedure: LEFT HEART CATH AND CORONARY ANGIOGRAPHY;  Surgeon: Belva Crome, MD;  Location: [place] [facility];  Service: Cardiovascular;  Laterality: N/A;   VASECTOMY      Current Meds  Medication Sig   acetaminophen (TYLENOL) 500 MG tablet Take 500 mg by mouth 3 (three) times daily as needed for moderate pain.   atorvastatin (LIPITOR) 80 MG tablet Take 80 mg by mouth at bedtime.   cinacalcet (SENSIPAR) 30 MG tablet Take by mouth 3 (three) times a week. Harrell Lark, Sat   clopidogrel (PLAVIX) 75 MG tablet Take 1 tablet (75 mg total) by mouth daily with breakfast. Restart after 5 days   Fluticasone Propionate,sensor, (ARMONAIR DIGIHALER) 232 MCG/ACT AEPB Inhale 1 puff into the lungs 2 (two) times daily.   insulin NPH-regular Human (70-30) 100 UNIT/ML injection Inject 10 Units into the skin daily with breakfast. Takes 20 units in the morning if blood sugar is over 140   isosorbide dinitrate (ISORDIL) 20 MG tablet Take 20 mg by mouth 2 (two) times daily.   Multiple Vitamin (MULTIVITAMIN WITH MINERALS) TABS tablet Take 1 tablet by mouth daily.   nitroGLYCERIN (NITROSTAT) 0.4 MG SL tablet Place 1 tablet (0.4 mg total) under the tongue every 5 (five) minutes x 3 doses as needed for chest pain.   Nutritional Supplements (NEPRO/CARBSTEADY PO) Take 237 mLs by mouth daily. vanilla   sevelamer carbonate (RENVELA) 800 MG tablet Take 1,600 mg by mouth 3 (three) times daily.    Allergies  Allergen Reactions   Hydrochlorothiazide Other (See Comments)    [facility]      Review of Systems negative except from HPI and PMH  Physical Exam BP 126/80    Pulse 82    Ht 5\' 8"  (1.727 m)    Wt 158 lb 6.4 oz (71.8 kg)    SpO2 94%    BMI 24.08 kg/m  Well developed and well nourished in no acute distress HENT normal Neck supple  Clear Device pocket well healed; without hematoma or erythema.  There is no tethering  Regular rate and rhythm, no murmur Abd-soft with active BS No Clubbing cyanosis   edema Skin-warm and dry A & Oriented  Grossly normal sensory and motor function  ECG sinus @ 82 18/08/38    CrCl cannot be calculated (Patient's most recent lab result is older than the maximum 21 days allowed.).  Assessment and Plan:  Syncope abrupt in onset and offset  End-stage renal disease TTS  Coronary artery disease with LAD stenting and residual circumflex and diagonal disease  Ventricular tachycardia-nonsustained  Angina decubitus  No recurrent syncope Episodes of bradycardia were reviewed and were notable for AV block with narrow QRS and what appeared to be loss of P waves altogether on event recorder; the in-hospital telemetry was associate with some bradycardia but also shorter PP intervals around the enclosed QRS.  Angina decubitus is better.  We will continue him on isosorbide dinitrate 20 mg twice daily.  Continues with some chest pain.  Catheterization report was reviewed.  We will continue him on  Plavix 75 isosorbide 20 twice daily.  He might be able to tolerate low-dose beta-blockers on his own dialysis days  Is scheduled to follow-up with Dr. Rebecca Eaton 5/23 and we will see him as needed  Prabhjot Piscitello [phone]  [phone]      [facility] as a scribe for Virl Axe, MD.,have documented all relevant documentation on the behalf of Virl Axe, MD,as directed by  Virl Axe, MD while in the presence of Virl Axe, MD.  I, Virl Axe, MD, have reviewed all documentation for this visit. The documentation on 02/01/21 for the exam, diagnosis, procedures, and orders are all accurate and complete.  So   Orion Crook, MD 02/01/2021 10:25 AM

## 2021-02-01 NOTE — Patient Instructions (Signed)

## 2021-02-10 DIAGNOSIS — E1159 Type 2 diabetes mellitus with other circulatory complications: Secondary | ICD-10-CM | POA: Diagnosis not present

## 2021-02-10 DIAGNOSIS — L84 Corns and callosities: Secondary | ICD-10-CM | POA: Diagnosis not present

## 2021-02-10 DIAGNOSIS — B351 Tinea unguium: Secondary | ICD-10-CM | POA: Diagnosis not present

## 2021-02-15 ENCOUNTER — Ambulatory Visit (INDEPENDENT_AMBULATORY_CARE_PROVIDER_SITE_OTHER): Payer: Medicare Other

## 2021-02-15 DIAGNOSIS — I442 Atrioventricular block, complete: Secondary | ICD-10-CM | POA: Diagnosis not present

## 2021-02-15 LAB — CUP PACEART REMOTE DEVICE CHECK
Date Time Interrogation Session: 20230122230414
Implantable Pulse Generator Implant Date: 20210412

## 2021-02-26 NOTE — Progress Notes (Signed)
[facility] Summary Report / Loop Recorder 

## 2021-03-19 DIAGNOSIS — J90 Pleural effusion, not elsewhere classified: Secondary | ICD-10-CM | POA: Diagnosis not present

## 2021-03-19 DIAGNOSIS — R0902 Hypoxemia: Secondary | ICD-10-CM | POA: Diagnosis not present

## 2021-03-21 LAB — CUP PACEART REMOTE DEVICE CHECK
Date Time Interrogation Session: 20230224230533
Implantable Pulse Generator Implant Date: 20210412

## 2021-03-22 ENCOUNTER — Ambulatory Visit (INDEPENDENT_AMBULATORY_CARE_PROVIDER_SITE_OTHER): Payer: Medicare Other

## 2021-03-22 DIAGNOSIS — I442 Atrioventricular block, complete: Secondary | ICD-10-CM

## 2021-03-29 NOTE — Progress Notes (Signed)
[facility] Summary Report / Loop Recorder 

## 2021-04-13 ENCOUNTER — Emergency Department (HOSPITAL_BASED_OUTPATIENT_CLINIC_OR_DEPARTMENT_OTHER): Payer: Medicare Other

## 2021-04-13 ENCOUNTER — Other Ambulatory Visit: Payer: Self-pay

## 2021-04-13 ENCOUNTER — Emergency Department (HOSPITAL_BASED_OUTPATIENT_CLINIC_OR_DEPARTMENT_OTHER)
Admission: EM | Admit: 2021-04-13 | Discharge: 2021-04-13 | Disposition: A | Discharge status: Home / Self Care | Payer: Medicare Other | Source: Home / Self Care | Attending: Emergency Medicine | Admitting: Emergency Medicine

## 2021-04-13 ENCOUNTER — Encounter (HOSPITAL_BASED_OUTPATIENT_CLINIC_OR_DEPARTMENT_OTHER): Payer: Self-pay | Admitting: *Deleted

## 2021-04-13 DIAGNOSIS — Z992 Dependence on renal dialysis: Secondary | ICD-10-CM | POA: Diagnosis not present

## 2021-04-13 DIAGNOSIS — D631 Anemia in chronic kidney disease: Secondary | ICD-10-CM | POA: Diagnosis not present

## 2021-04-13 DIAGNOSIS — Z833 Family history of diabetes mellitus: Secondary | ICD-10-CM | POA: Diagnosis not present

## 2021-04-13 DIAGNOSIS — L84 Corns and callosities: Secondary | ICD-10-CM | POA: Diagnosis not present

## 2021-04-13 DIAGNOSIS — E161 Other hypoglycemia: Secondary | ICD-10-CM | POA: Diagnosis not present

## 2021-04-13 DIAGNOSIS — E1159 Type 2 diabetes mellitus with other circulatory complications: Secondary | ICD-10-CM | POA: Diagnosis not present

## 2021-04-13 DIAGNOSIS — I517 Cardiomegaly: Secondary | ICD-10-CM | POA: Diagnosis not present

## 2021-04-13 DIAGNOSIS — Z823 Family history of stroke: Secondary | ICD-10-CM | POA: Diagnosis not present

## 2021-04-13 DIAGNOSIS — I63312 Cerebral infarction due to thrombosis of left middle cerebral artery: Secondary | ICD-10-CM | POA: Diagnosis not present

## 2021-04-13 DIAGNOSIS — R911 Solitary pulmonary nodule: Secondary | ICD-10-CM | POA: Diagnosis present

## 2021-04-13 DIAGNOSIS — Z79899 Other long term (current) drug therapy: Secondary | ICD-10-CM | POA: Diagnosis not present

## 2021-04-13 DIAGNOSIS — I639 Cerebral infarction, unspecified: Secondary | ICD-10-CM | POA: Diagnosis not present

## 2021-04-13 DIAGNOSIS — N186 End stage renal disease: Secondary | ICD-10-CM | POA: Diagnosis not present

## 2021-04-13 DIAGNOSIS — I953 Hypotension of hemodialysis: Secondary | ICD-10-CM | POA: Diagnosis not present

## 2021-04-13 DIAGNOSIS — Z7982 Long term (current) use of aspirin: Secondary | ICD-10-CM | POA: Diagnosis not present

## 2021-04-13 DIAGNOSIS — B351 Tinea unguium: Secondary | ICD-10-CM | POA: Diagnosis not present

## 2021-04-13 DIAGNOSIS — Z87891 Personal history of nicotine dependence: Secondary | ICD-10-CM | POA: Diagnosis not present

## 2021-04-13 DIAGNOSIS — R7989 Other specified abnormal findings of blood chemistry: Secondary | ICD-10-CM | POA: Insufficient documentation

## 2021-04-13 DIAGNOSIS — Z20822 Contact with and (suspected) exposure to covid-19: Secondary | ICD-10-CM | POA: Diagnosis present

## 2021-04-13 DIAGNOSIS — I959 Hypotension, unspecified: Secondary | ICD-10-CM | POA: Diagnosis not present

## 2021-04-13 DIAGNOSIS — I2510 Atherosclerotic heart disease of native coronary artery without angina pectoris: Secondary | ICD-10-CM | POA: Diagnosis present

## 2021-04-13 DIAGNOSIS — I679 Cerebrovascular disease, unspecified: Secondary | ICD-10-CM | POA: Diagnosis not present

## 2021-04-13 DIAGNOSIS — E7800 Pure hypercholesterolemia, unspecified: Secondary | ICD-10-CM | POA: Diagnosis not present

## 2021-04-13 DIAGNOSIS — E1122 Type 2 diabetes mellitus with diabetic chronic kidney disease: Secondary | ICD-10-CM | POA: Insufficient documentation

## 2021-04-13 DIAGNOSIS — R270 Ataxia, unspecified: Secondary | ICD-10-CM | POA: Diagnosis present

## 2021-04-13 DIAGNOSIS — R0689 Other abnormalities of breathing: Secondary | ICD-10-CM | POA: Diagnosis present

## 2021-04-13 DIAGNOSIS — Z7901 Long term (current) use of anticoagulants: Secondary | ICD-10-CM | POA: Insufficient documentation

## 2021-04-13 DIAGNOSIS — N250 Renal osteodystrophy: Secondary | ICD-10-CM | POA: Diagnosis not present

## 2021-04-13 DIAGNOSIS — I6389 Other cerebral infarction: Secondary | ICD-10-CM | POA: Diagnosis not present

## 2021-04-13 DIAGNOSIS — I120 Hypertensive chronic kidney disease with stage 5 chronic kidney disease or end stage renal disease: Secondary | ICD-10-CM | POA: Diagnosis not present

## 2021-04-13 DIAGNOSIS — Z794 Long term (current) use of insulin: Secondary | ICD-10-CM | POA: Diagnosis not present

## 2021-04-13 DIAGNOSIS — I9589 Other hypotension: Secondary | ICD-10-CM | POA: Diagnosis not present

## 2021-04-13 DIAGNOSIS — J90 Pleural effusion, not elsewhere classified: Secondary | ICD-10-CM | POA: Diagnosis not present

## 2021-04-13 DIAGNOSIS — R600 Localized edema: Secondary | ICD-10-CM | POA: Diagnosis not present

## 2021-04-13 DIAGNOSIS — I63512 Cerebral infarction due to unspecified occlusion or stenosis of left middle cerebral artery: Secondary | ICD-10-CM | POA: Diagnosis not present

## 2021-04-13 DIAGNOSIS — Z955 Presence of coronary angioplasty implant and graft: Secondary | ICD-10-CM | POA: Diagnosis not present

## 2021-04-13 DIAGNOSIS — E162 Hypoglycemia, unspecified: Secondary | ICD-10-CM | POA: Diagnosis not present

## 2021-04-13 DIAGNOSIS — I63412 Cerebral infarction due to embolism of left middle cerebral artery: Secondary | ICD-10-CM | POA: Diagnosis not present

## 2021-04-13 DIAGNOSIS — Z7902 Long term (current) use of antithrombotics/antiplatelets: Secondary | ICD-10-CM | POA: Diagnosis not present

## 2021-04-13 DIAGNOSIS — R4182 Altered mental status, unspecified: Secondary | ICD-10-CM | POA: Diagnosis not present

## 2021-04-13 DIAGNOSIS — I63233 Cerebral infarction due to unspecified occlusion or stenosis of bilateral carotid arteries: Secondary | ICD-10-CM | POA: Diagnosis not present

## 2021-04-13 DIAGNOSIS — R001 Bradycardia, unspecified: Secondary | ICD-10-CM | POA: Diagnosis not present

## 2021-04-13 DIAGNOSIS — I10 Essential (primary) hypertension: Secondary | ICD-10-CM | POA: Diagnosis not present

## 2021-04-13 DIAGNOSIS — R4701 Aphasia: Secondary | ICD-10-CM | POA: Diagnosis present

## 2021-04-13 DIAGNOSIS — R000 Tachycardia, unspecified: Secondary | ICD-10-CM | POA: Diagnosis not present

## 2021-04-13 DIAGNOSIS — D638 Anemia in other chronic diseases classified elsewhere: Secondary | ICD-10-CM | POA: Diagnosis not present

## 2021-04-13 LAB — CBC WITH DIFFERENTIAL/PLATELET
Abs Immature Granulocytes: 0.03 10*3/uL (ref 0.00–0.07)
Basophils Absolute: 0 10*3/uL (ref 0.0–0.1)
Basophils Relative: 0 %
Eosinophils Absolute: 0.3 10*3/uL (ref 0.0–0.5)
Eosinophils Relative: 3 %
HCT: 27.2 % — ABNORMAL LOW (ref 39.0–52.0)
Hemoglobin: 9 g/dL — ABNORMAL LOW (ref 13.0–17.0)
Immature Granulocytes: 0 %
Lymphocytes Relative: 11 %
Lymphs Abs: 1 10*3/uL (ref 0.7–4.0)
MCH: 33.1 pg (ref 26.0–34.0)
MCHC: 33.1 g/dL (ref 30.0–36.0)
MCV: 100 fL (ref 80.0–100.0)
Monocytes Absolute: 0.8 10*3/uL (ref 0.1–1.0)
Monocytes Relative: 10 %
Neutro Abs: 6.5 10*3/uL (ref 1.7–7.7)
Neutrophils Relative %: 76 %
Platelets: 287 10*3/uL (ref 150–400)
RBC: 2.72 MIL/uL — ABNORMAL LOW (ref 4.22–5.81)
RDW: 13.4 % (ref 11.5–15.5)
WBC: 8.6 10*3/uL (ref 4.0–10.5)
nRBC: 0 % (ref 0.0–0.2)

## 2021-04-13 LAB — COMPREHENSIVE METABOLIC PANEL
ALT: 20 U/L (ref 0–44)
AST: 19 U/L (ref 15–41)
Albumin: 3.2 g/dL — ABNORMAL LOW (ref 3.5–5.0)
Alkaline Phosphatase: 101 U/L (ref 38–126)
Anion gap: 12 (ref 5–15)
BUN: 24 mg/dL — ABNORMAL HIGH (ref 8–23)
CO2: 32 mmol/L (ref 22–32)
Calcium: 8.7 mg/dL — ABNORMAL LOW (ref 8.9–10.3)
Chloride: 92 mmol/L — ABNORMAL LOW (ref 98–111)
Creatinine, Ser: 4.85 mg/dL — ABNORMAL HIGH (ref 0.61–1.24)
GFR, Estimated: 11 mL/min — ABNORMAL LOW (ref >60)
Glucose, Bld: 215 mg/dL — ABNORMAL HIGH (ref 70–99)
Potassium: 3.5 mmol/L (ref 3.5–5.1)
Sodium: 136 mmol/L (ref 135–145)
Total Bilirubin: 0.4 mg/dL (ref 0.3–1.2)
Total Protein: 7.7 g/dL (ref 6.5–8.1)

## 2021-04-13 LAB — RESP PANEL BY RT-PCR (FLU A&B, COVID) ARPGX2
Influenza A by PCR: NEGATIVE
Influenza B by PCR: NEGATIVE
SARS Coronavirus 2 by RT PCR: NEGATIVE

## 2021-04-13 LAB — LIPASE, BLOOD: Lipase: 42 U/L (ref 11–51)

## 2021-04-13 LAB — TROPONIN I (HIGH SENSITIVITY): Troponin I (High Sensitivity): 14 ng/L (ref <18)

## 2021-04-13 LAB — CBG MONITORING, ED: Glucose-Capillary: 205 mg/dL — ABNORMAL HIGH (ref 70–99)

## 2021-04-13 IMAGING — CT CT HEAD W/O CM
4 series · 15 of 47 positions shown, 17 images · non-contrast
Comparison: None.

CLINICAL DATA: Altered mental status.



[Series 2: head wo · axial · 0.46mm/px · z∈[+214,+334]mm · 7 of 32 slices shown, 9 images]
[im 4/32  brain]
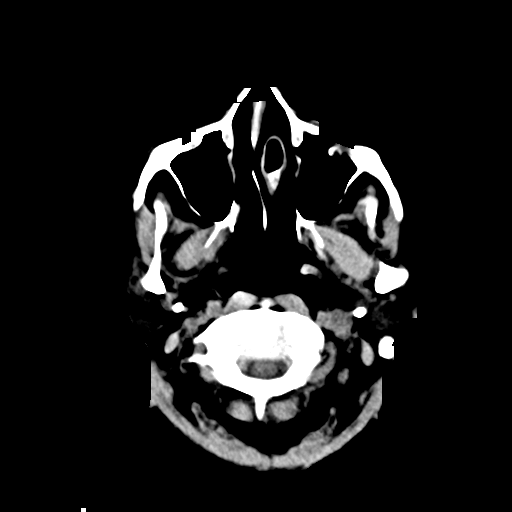
[im 4/32  bone]
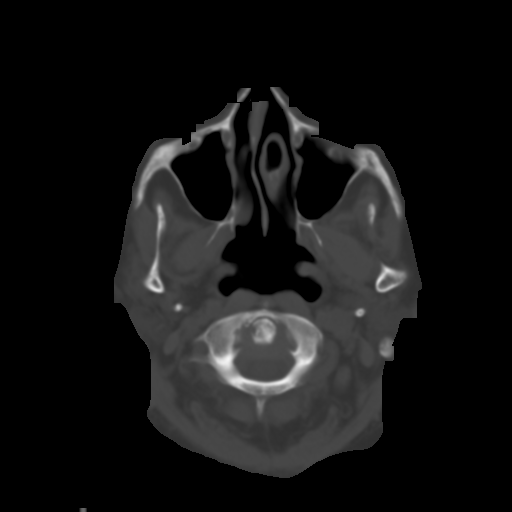
[im 8/32  brain]
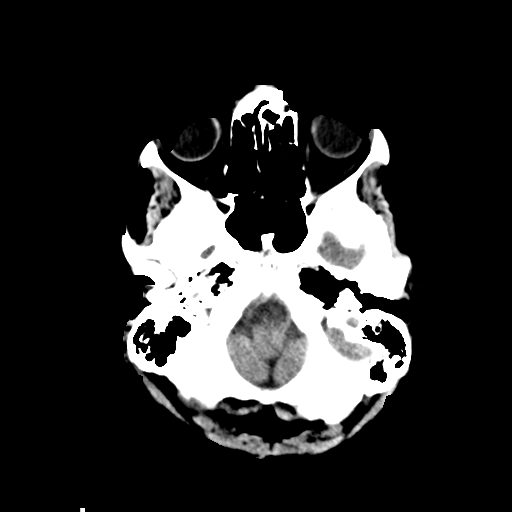
[im 12/32  brain]
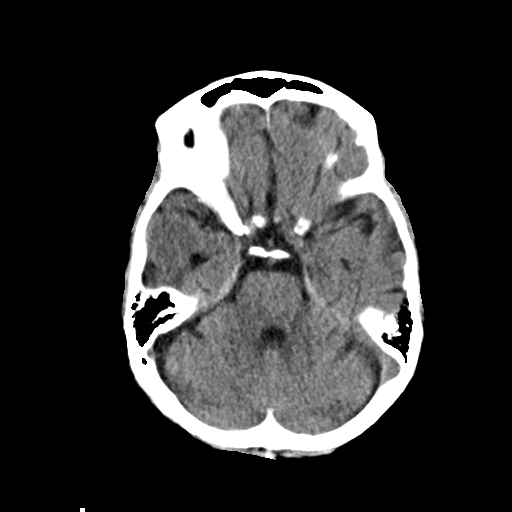
[im 16/32  brain]
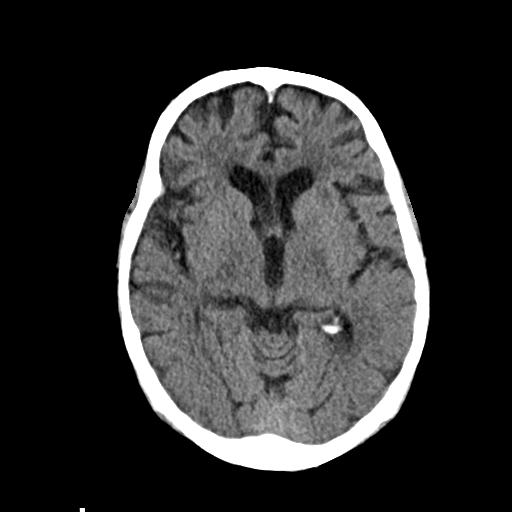
[im 20/32  brain]
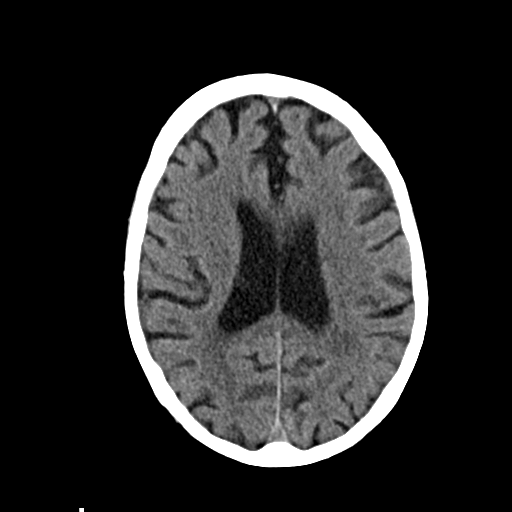
[im 20/32  bone]
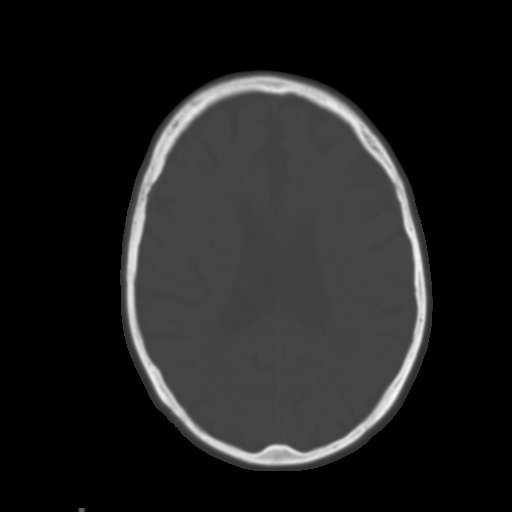
[im 24/32  brain]
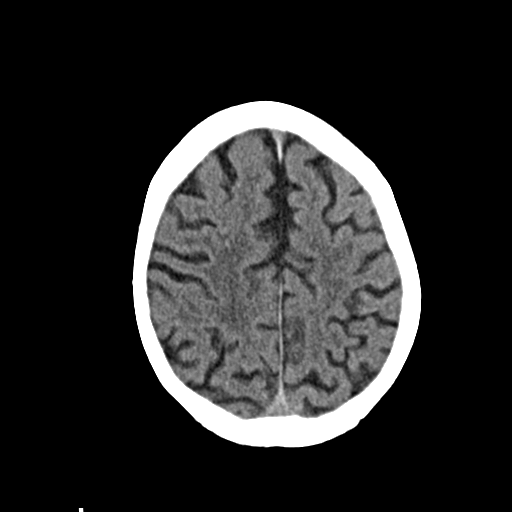
[im 28/32  brain]
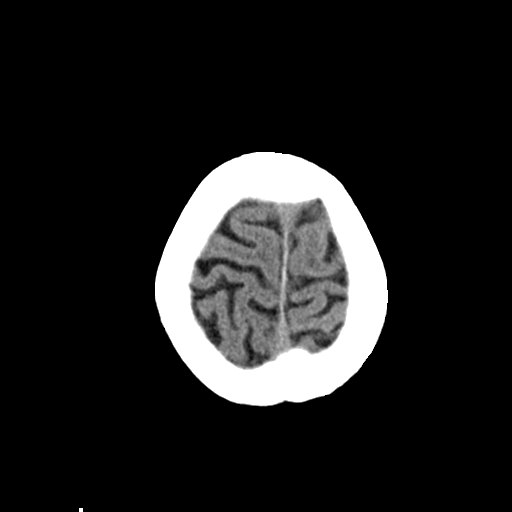

[Series 3: head bone · axial · 0.46mm/px · z∈[+212,+228]mm · 2 of 80 slices shown]
[im 8/80  bone]
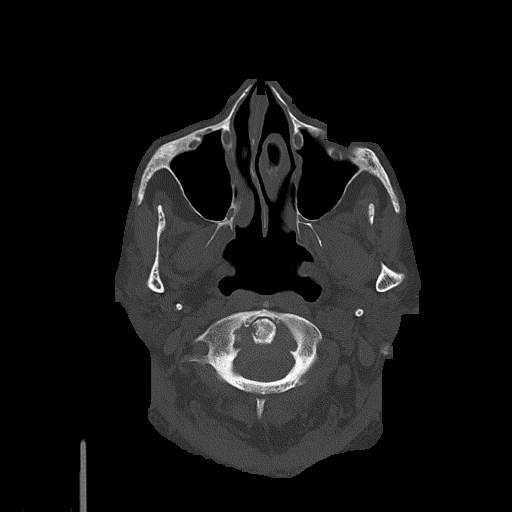
[im 16/80  bone]
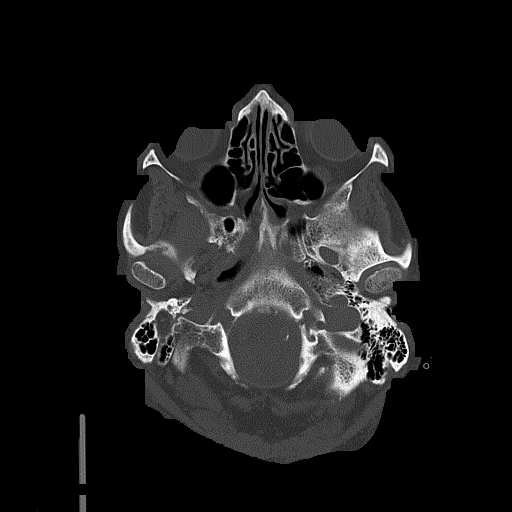

[Series 4: coronal soft · coronal · 0.31mm/px · 3 of 67 slices shown]
[im 30/67  brain]
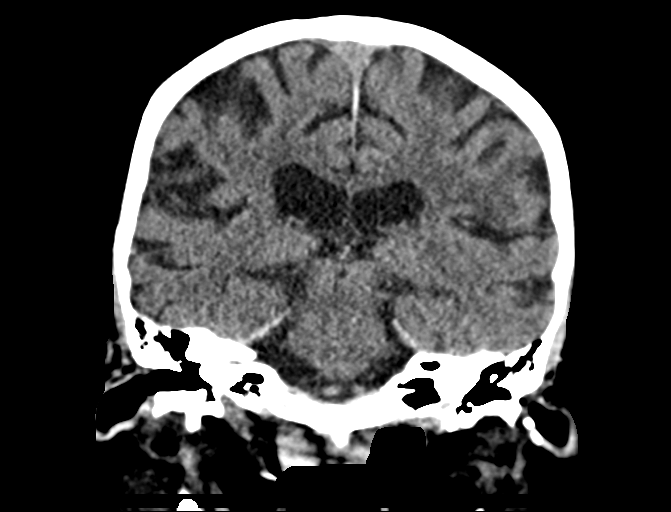
[im 37/67  brain]
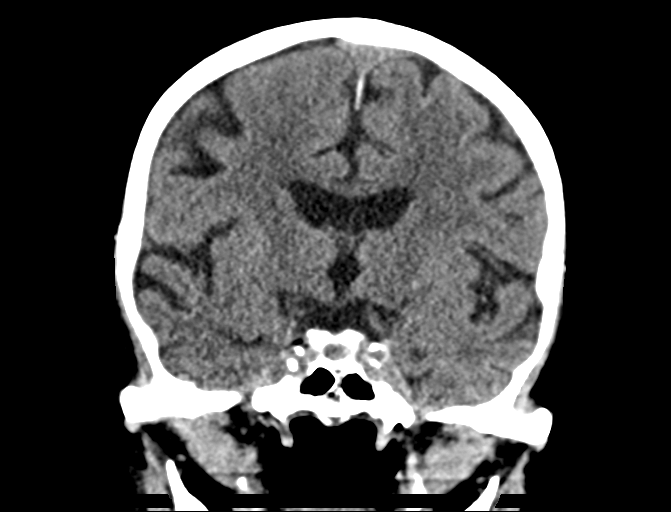
[im 45/67  brain]
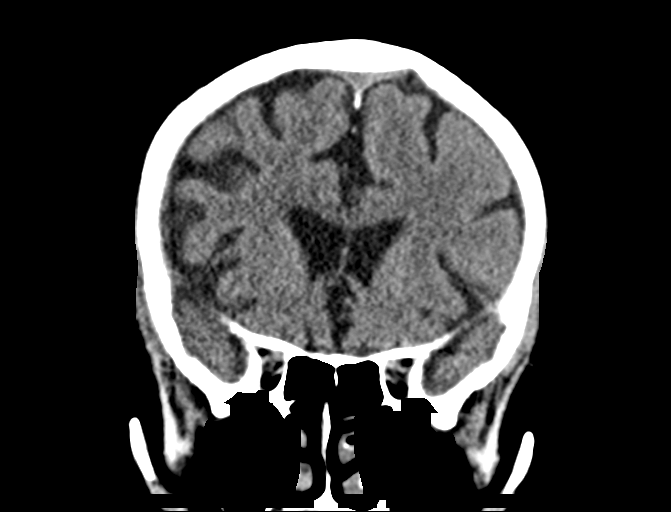

[Series 5: sag soft · sagittal · 0.31mm/px · 3 of 66 slices shown]
[im 22/66  brain]
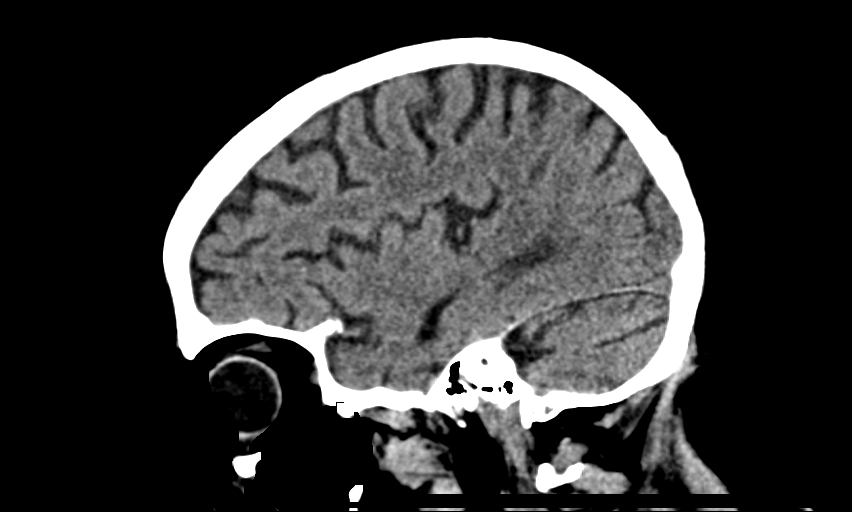
[im 33/66  brain]
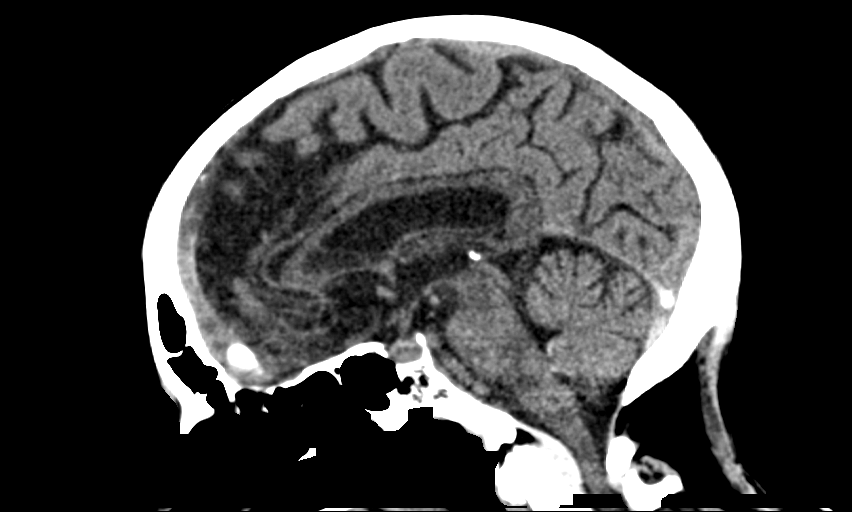
[im 44/66  brain]
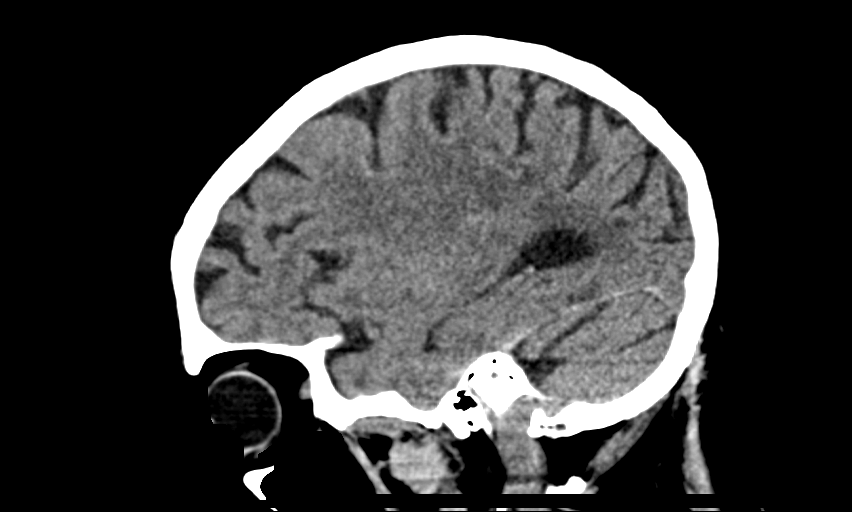

[15 of 47 positions shown; findings below may reference images not displayed]

FINDINGS: Brain: Normal for age atrophy. No intracranial hemorrhage, mass
effect, or midline shift. No hydrocephalus. The basilar cisterns are
patent. Mild for age chronic small vessel ischemic change. No
evidence of territorial infarct or acute ischemia. No extra-axial or
intracranial fluid collection.

Vascular: Atherosclerosis of skullbase vasculature without
hyperdense vessel or abnormal calcification.

Skull: No fracture or focal lesion.

Sinuses/Orbits: Paranasal sinuses and mastoid air cells are clear.
The visualized orbits are unremarkable. Bilateral cataract
resection.

Other: None.
IMPRESSION: 1. No acute intracranial abnormality.
2. Age-related atrophy and chronic small vessel ischemic change.

## 2021-04-13 IMAGING — DX DG CHEST 1V PORT
1 series · 1 of 1 positions shown · non-contrast
Comparison: Chest x-ray [DATE]. PET-CT [DATE].

CLINICAL DATA: Altered mental status.

EXAM:
PORTABLE CHEST 1 VIEW

[chest ap]
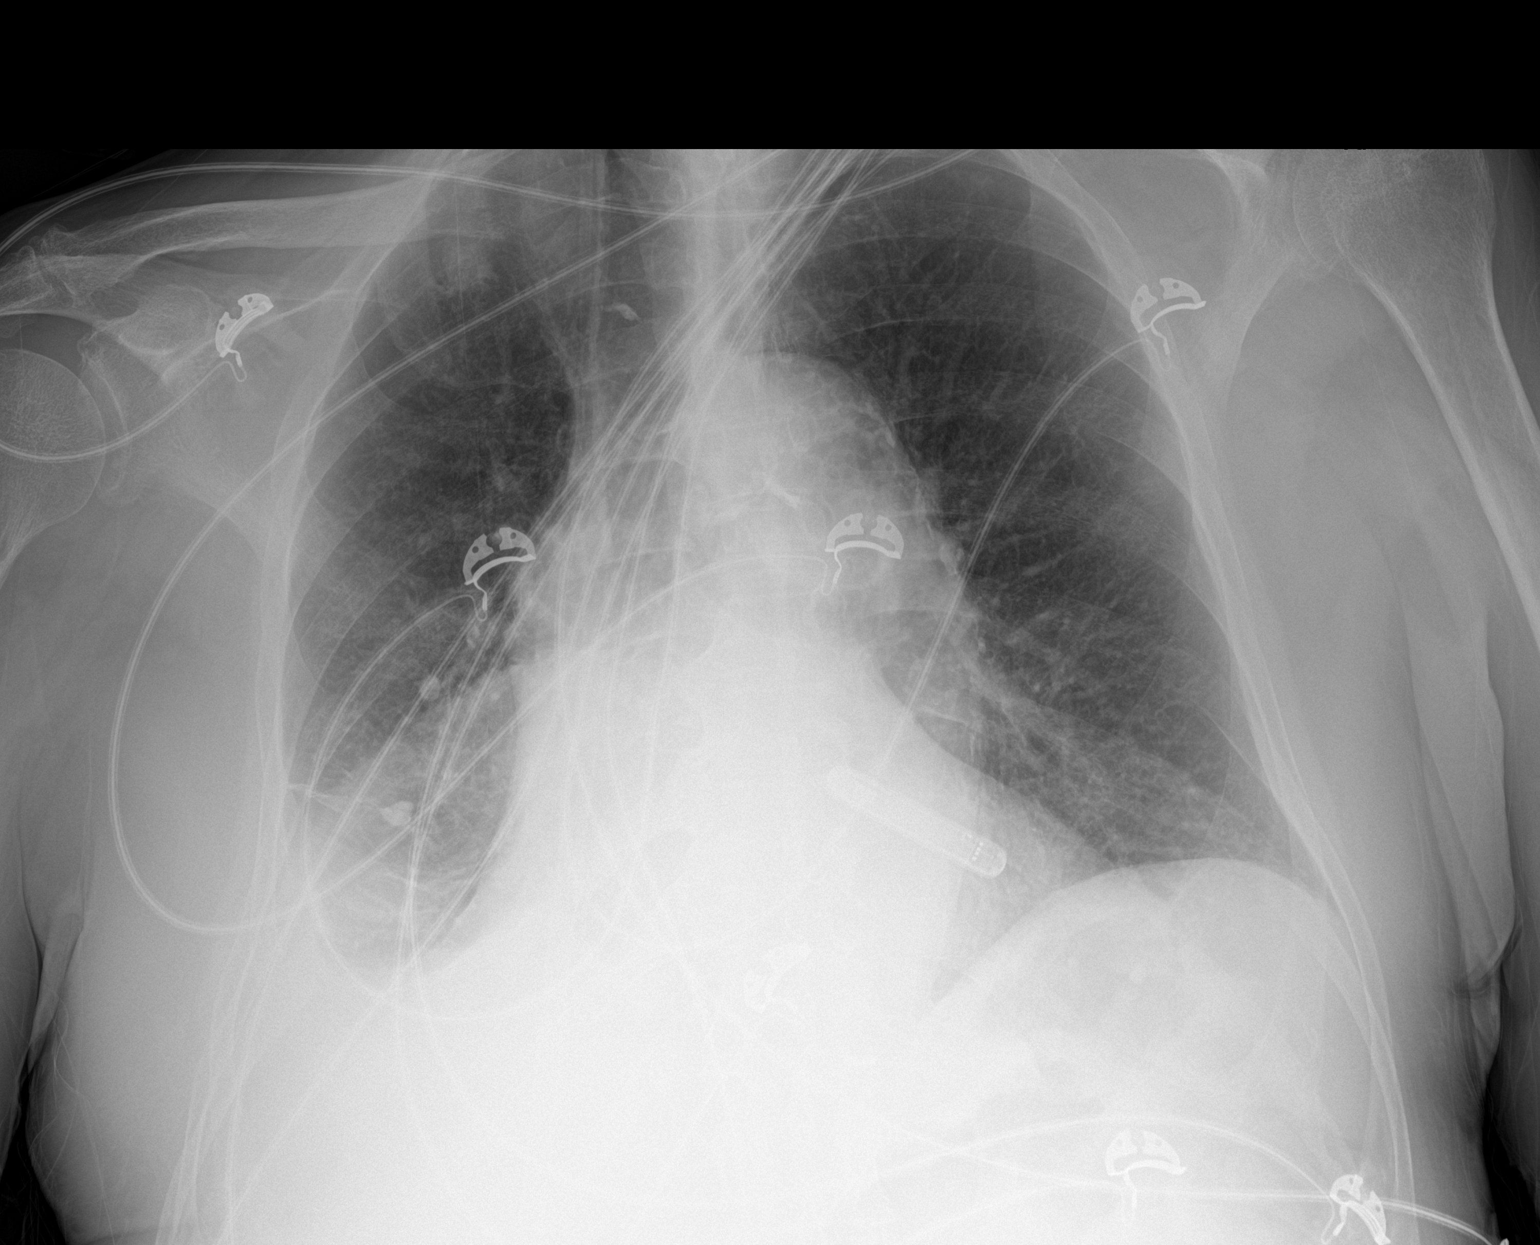

[1 of 1 positions shown; findings below may reference images not displayed]

FINDINGS: Loop recorder device overlies the chest, unchanged. The aorta is
tortuous and the heart is enlarged, unchanged. Small right pleural
effusion persists. There are calcified nodules in the right lung,
unchanged. There is a questionable focal nodular density in the
right lung apex measuring 11 mm, not seen on prior. Left lung is
clear. No fractures are identified.
IMPRESSION: 1. Stable cardiomegaly with small right pleural effusion.
2. Questionable 11 mm nodular density in the right lung apex.
Recommend follow-up chest CT.

## 2021-04-13 NOTE — ED Provider Notes (Signed)
?[facility] ?Provider Note ? ? ?CSN: [id number] ?Arrival date & time: 04/13/21  1947 ? ?  ? ?History ? ?Chief Complaint  ?Patient presents with  ? Altered Mental Status  ? ? ?Raynaldo Falco is a 88 y.o. male. ? ?Patient here for evaluation of altered mental status.  Patient with history of end-stage renal disease on hemodialysis.  History of high cholesterol, hypertension, diabetes as well.  Wife states he woke up kind of confused this morning, was slow with his speech and movements and overall was, lethargic.  Did not have any unilateral weakness, numbness, vision changes.  He went to dialysis and seems to be improving but wanted to bring him in for evaluation.  Patient has no complaints.  He denies any chest pain or shortness of breath.  He knows where he is.  He has no weakness or numbness.  No speech changes or vision changes.  Sounds like he is doing better since he went to dialysis today.  Denies any fevers or chills.Has not had any loss of consciousness or falls.  No recent illnesses. ? ?The history is provided by the patient.  ?Altered Mental Status ? ?  ? ?Home Medications ?Prior to Admission medications   ?Medication Sig Start Date End Date Taking? Authorizing Provider  ?acetaminophen (TYLENOL) 500 MG tablet Take 500 mg by mouth 3 (three) times daily as needed for moderate pain.    [provider]  ?atorvastatin (LIPITOR) 80 MG tablet Take 80 mg by mouth at bedtime.    [provider]  ?cinacalcet (SENSIPAR) 30 MG tablet Take by mouth 3 (three) times a week. Harrell Lark, Sat 09/18/20   [provider]  ?clopidogrel (PLAVIX) 75 MG tablet Take 1 tablet (75 mg total) by mouth daily with breakfast. Restart after 5 days 09/18/20   Domenic Polite, MD  ?Fluticasone Propionate,sensor, (ARMONAIR DIGIHALER) 232 MCG/ACT AEPB Inhale 1 puff into the lungs 2 (two) times daily.    [provider]  ?insulin NPH-regular Human (70-30) 100 UNIT/ML injection Inject  10 Units into the skin daily with breakfast. Takes 20 units in the morning if blood sugar is over 140 09/13/20   Domenic Polite, MD  ?isosorbide dinitrate (ISORDIL) 20 MG tablet Take 20 mg by mouth 2 (two) times daily. 09/18/20   [provider]  ?Multiple Vitamin (MULTIVITAMIN WITH MINERALS) TABS tablet Take 1 tablet by mouth daily.    [provider]  ?nitroGLYCERIN (NITROSTAT) 0.4 MG SL tablet Place 1 tablet (0.4 mg total) under the tongue every 5 (five) minutes x 3 doses as needed for chest pain. 08/10/18   [name], MD  ?Nutritional Supplements (NEPRO/CARBSTEADY PO) Take 237 mLs by mouth daily. vanilla    [provider]  ?sevelamer carbonate (RENVELA) 800 MG tablet Take 1,600 mg by mouth 3 (three) times daily. 05/22/19   [provider]  ?   ? ?Allergies    ?Hydrochlorothiazide   ? ?Review of Systems   ?Review of Systems ? ?Physical Exam ?Updated Vital Signs ?BP 122/64   Pulse 92   Temp 98.8 ?F (37.1 ?C)   Resp 19   Ht '5\' 8"'$  (1.727 m)   Wt 71.8 kg   SpO2 92%   BMI 24.07 kg/m?  ?Physical Exam ?Vitals and nursing note reviewed.  ?Constitutional:   ?   General: He is not in acute distress. ?   Appearance: He is well-developed.  ?HENT:  ?   Head: Normocephalic and atraumatic.  ?  Nose: Nose normal.  ?   Mouth/Throat:  ?   Mouth: Mucous membranes are moist.  ?Eyes:  ?   Extraocular Movements: Extraocular movements intact.  ?   Conjunctiva/sclera: Conjunctivae normal.  ?   Pupils: Pupils are equal, round, and reactive to light.  ?Cardiovascular:  ?   Rate and Rhythm: Normal rate and regular rhythm.  ?   Pulses: Normal pulses.  ?   Heart sounds: Normal heart sounds. No murmur heard. ?Pulmonary:  ?   Effort: Pulmonary effort is normal. No respiratory distress.  ?   Breath sounds: Normal breath sounds.  ?Abdominal:  ?   Palpations: Abdomen is soft.  ?   Tenderness: There is no abdominal tenderness.  ?Musculoskeletal:     ?   General: No swelling.  ?   Cervical back:  Neck supple.  ?Skin: ?   General: Skin is warm and dry.  ?   Capillary Refill: Capillary refill takes less than 2 seconds.  ?Neurological:  ?   General: No focal deficit present.  ?   Mental Status: He is alert and oriented to person, place, and time.  ?   Cranial Nerves: No cranial nerve deficit.  ?   Sensory: No sensory deficit.  ?   Motor: No weakness.  ?   Coordination: Coordination normal.  ?   Comments: 5+ out of 5 strength throughout, normal sensation, no drift, normal finger-nose-finger, normal speech, no visual field deficit  ?Psychiatric:     ?   Mood and Affect: Mood normal.  ? ? ?ED Results / Procedures / Treatments   ?Labs ?(all labs ordered are listed, but only abnormal results are displayed) ?Labs Reviewed  ?CBC WITH DIFFERENTIAL/PLATELET - Abnormal; Notable for the following components:  ?    Result Value  ? RBC 2.72 (*)   ? Hemoglobin 9.0 (*)   ? HCT 27.2 (*)   ? All other components within normal limits  ?COMPREHENSIVE METABOLIC PANEL - Abnormal; Notable for the following components:  ? Chloride 92 (*)   ? Glucose, Bld 215 (*)   ? BUN 24 (*)   ? Creatinine, Ser 4.85 (*)   ? Calcium 8.7 (*)   ? Albumin 3.2 (*)   ? GFR, Estimated 11 (*)   ? All other components within normal limits  ?CBG MONITORING, ED - Abnormal; Notable for the following components:  ? Glucose-Capillary 205 (*)   ? All other components within normal limits  ?RESP PANEL BY RT-PCR (FLU A&B, COVID) [id number]  ?LIPASE, BLOOD  ?TROPONIN I (HIGH SENSITIVITY)  ? ? ?EKG ?None ? ?Radiology ?CT Head Wo Contrast ? ?Result Date: 04/13/2021 ?CLINICAL DATA:  Altered mental status. EXAM: CT HEAD WITHOUT CONTRAST TECHNIQUE: Contiguous axial images were obtained from the base of the skull through the vertex without intravenous contrast. RADIATION DOSE REDUCTION: This exam was performed according to the departmental dose-optimization program which includes automated exposure control, adjustment of the mA and/or kV according to patient size and/or use of  iterative reconstruction technique. COMPARISON:  None. FINDINGS: Brain: Normal for age atrophy. No intracranial hemorrhage, mass effect, or midline shift. No hydrocephalus. The basilar cisterns are patent. Mild for age chronic small vessel ischemic change. No evidence of territorial infarct or acute ischemia. No extra-axial or intracranial fluid collection. Vascular: Atherosclerosis of skullbase vasculature without hyperdense vessel or abnormal calcification. Skull: No fracture or focal lesion. Sinuses/Orbits: Paranasal sinuses and mastoid air cells are clear. The visualized orbits are unremarkable. Bilateral cataract resection. Other: None.  IMPRESSION: 1. No acute intracranial abnormality. 2. Age-related atrophy and chronic small vessel ischemic change. Electronically Signed   By: Keith Rake M.D.   On: 04/13/2021 20:37  ? ?DG Chest Portable 1 View ? ?Result Date: 04/13/2021 ?CLINICAL DATA:  Altered mental status. EXAM: PORTABLE CHEST 1 VIEW COMPARISON:  Chest x-ray 07/06/2020. PET-CT 09/07/2020. FINDINGS: Loop recorder device overlies the chest, unchanged. The aorta is tortuous and the heart is enlarged, unchanged. Small right pleural effusion persists. There are calcified nodules in the right lung, unchanged. There is a questionable focal nodular density in the right lung apex measuring 11 mm, not seen on prior. Left lung is clear. No fractures are identified. IMPRESSION: 1. Stable cardiomegaly with small right pleural effusion. 2. Questionable 11 mm nodular density in the right lung apex. Recommend follow-up chest CT. Electronically Signed   By: Ronney Asters M.D.   On: 04/13/2021 20:24   ? ?Procedures ?Procedures  ? ? ?Medications Ordered in ED ?Medications - No data to display ? ?ED Course/ Medical Decision Making/ A&P ?  ?                        ?Medical Decision Making ?Amount and/or Complexity of Data Reviewed ?Labs: ordered. ?Radiology: ordered. ? ? ?Jaymien Landin is here with altered mental status.   Normal vitals.  No fever.  Patient denies any chest pain, shortness of breath, abdominal pain.  Wife states that he woke up this morning confused and generally weak and slow with his actions and with his wor

## 2021-04-13 NOTE — Discharge Instructions (Signed)
Lab work and imaging today is unremarkable.  Follow-up with your primary care doctor.  Return if symptoms are worse. ?

## 2021-04-13 NOTE — ED Triage Notes (Signed)
Wife states he was normal when he went to bed last night. He woke at 7:30 am he woke and seemed confused and his speech was slurred. He went to dialysis and as the day progressed his confusion got worse. Pt is on home oxygen. Wife states he seems sob. Has not eaten since this am. Seems weaker than normal to his wife. He knows his age and speech is normal at triage.  ?

## 2021-04-14 DIAGNOSIS — L84 Corns and callosities: Secondary | ICD-10-CM | POA: Diagnosis not present

## 2021-04-14 DIAGNOSIS — E1159 Type 2 diabetes mellitus with other circulatory complications: Secondary | ICD-10-CM | POA: Diagnosis not present

## 2021-04-14 DIAGNOSIS — B351 Tinea unguium: Secondary | ICD-10-CM | POA: Diagnosis not present

## 2021-04-15 ENCOUNTER — Other Ambulatory Visit: Payer: Self-pay

## 2021-04-15 ENCOUNTER — Inpatient Hospital Stay (HOSPITAL_COMMUNITY)
Admission: EM | Admit: 2021-04-15 | Discharge: 2021-04-17 | DRG: 064 | Disposition: A | Discharge status: Home Health | Payer: Medicare Other | Attending: Internal Medicine | Admitting: Internal Medicine

## 2021-04-15 ENCOUNTER — Emergency Department (HOSPITAL_COMMUNITY): Payer: Medicare Other

## 2021-04-15 ENCOUNTER — Inpatient Hospital Stay (HOSPITAL_COMMUNITY): Payer: Medicare Other

## 2021-04-15 ENCOUNTER — Encounter (HOSPITAL_COMMUNITY): Payer: Self-pay

## 2021-04-15 DIAGNOSIS — E1129 Type 2 diabetes mellitus with other diabetic kidney complication: Secondary | ICD-10-CM | POA: Diagnosis present

## 2021-04-15 DIAGNOSIS — E162 Hypoglycemia, unspecified: Secondary | ICD-10-CM | POA: Diagnosis present

## 2021-04-15 DIAGNOSIS — I959 Hypotension, unspecified: Secondary | ICD-10-CM | POA: Diagnosis present

## 2021-04-15 DIAGNOSIS — I2510 Atherosclerotic heart disease of native coronary artery without angina pectoris: Secondary | ICD-10-CM | POA: Diagnosis present

## 2021-04-15 DIAGNOSIS — R29702 NIHSS score 2: Secondary | ICD-10-CM | POA: Diagnosis present

## 2021-04-15 DIAGNOSIS — I953 Hypotension of hemodialysis: Secondary | ICD-10-CM | POA: Diagnosis not present

## 2021-04-15 DIAGNOSIS — E7800 Pure hypercholesterolemia, unspecified: Secondary | ICD-10-CM | POA: Diagnosis present

## 2021-04-15 DIAGNOSIS — D631 Anemia in chronic kidney disease: Secondary | ICD-10-CM | POA: Diagnosis present

## 2021-04-15 DIAGNOSIS — Z823 Family history of stroke: Secondary | ICD-10-CM

## 2021-04-15 DIAGNOSIS — R911 Solitary pulmonary nodule: Secondary | ICD-10-CM | POA: Diagnosis present

## 2021-04-15 DIAGNOSIS — I120 Hypertensive chronic kidney disease with stage 5 chronic kidney disease or end stage renal disease: Secondary | ICD-10-CM | POA: Diagnosis present

## 2021-04-15 DIAGNOSIS — I639 Cerebral infarction, unspecified: Secondary | ICD-10-CM | POA: Diagnosis present

## 2021-04-15 DIAGNOSIS — D638 Anemia in other chronic diseases classified elsewhere: Secondary | ICD-10-CM | POA: Diagnosis present

## 2021-04-15 DIAGNOSIS — E1122 Type 2 diabetes mellitus with diabetic chronic kidney disease: Secondary | ICD-10-CM | POA: Diagnosis present

## 2021-04-15 DIAGNOSIS — Z7902 Long term (current) use of antithrombotics/antiplatelets: Secondary | ICD-10-CM | POA: Diagnosis not present

## 2021-04-15 DIAGNOSIS — R270 Ataxia, unspecified: Secondary | ICD-10-CM | POA: Diagnosis present

## 2021-04-15 DIAGNOSIS — Z20822 Contact with and (suspected) exposure to covid-19: Secondary | ICD-10-CM | POA: Diagnosis present

## 2021-04-15 DIAGNOSIS — Z79899 Other long term (current) drug therapy: Secondary | ICD-10-CM | POA: Diagnosis not present

## 2021-04-15 DIAGNOSIS — I10 Essential (primary) hypertension: Secondary | ICD-10-CM | POA: Diagnosis not present

## 2021-04-15 DIAGNOSIS — Z992 Dependence on renal dialysis: Secondary | ICD-10-CM | POA: Diagnosis not present

## 2021-04-15 DIAGNOSIS — K219 Gastro-esophageal reflux disease without esophagitis: Secondary | ICD-10-CM | POA: Diagnosis present

## 2021-04-15 DIAGNOSIS — I63233 Cerebral infarction due to unspecified occlusion or stenosis of bilateral carotid arteries: Secondary | ICD-10-CM | POA: Diagnosis not present

## 2021-04-15 DIAGNOSIS — N186 End stage renal disease: Secondary | ICD-10-CM | POA: Diagnosis present

## 2021-04-15 DIAGNOSIS — Z7982 Long term (current) use of aspirin: Secondary | ICD-10-CM

## 2021-04-15 DIAGNOSIS — I63412 Cerebral infarction due to embolism of left middle cerebral artery: Secondary | ICD-10-CM | POA: Diagnosis present

## 2021-04-15 DIAGNOSIS — Z794 Long term (current) use of insulin: Secondary | ICD-10-CM | POA: Diagnosis not present

## 2021-04-15 DIAGNOSIS — R4701 Aphasia: Secondary | ICD-10-CM | POA: Diagnosis present

## 2021-04-15 DIAGNOSIS — I9589 Other hypotension: Secondary | ICD-10-CM | POA: Diagnosis not present

## 2021-04-15 DIAGNOSIS — R0689 Other abnormalities of breathing: Secondary | ICD-10-CM | POA: Diagnosis present

## 2021-04-15 DIAGNOSIS — Z87891 Personal history of nicotine dependence: Secondary | ICD-10-CM

## 2021-04-15 DIAGNOSIS — Z833 Family history of diabetes mellitus: Secondary | ICD-10-CM | POA: Diagnosis not present

## 2021-04-15 DIAGNOSIS — E161 Other hypoglycemia: Secondary | ICD-10-CM | POA: Diagnosis not present

## 2021-04-15 DIAGNOSIS — I6389 Other cerebral infarction: Secondary | ICD-10-CM | POA: Diagnosis not present

## 2021-04-15 DIAGNOSIS — R001 Bradycardia, unspecified: Secondary | ICD-10-CM | POA: Diagnosis not present

## 2021-04-15 DIAGNOSIS — R600 Localized edema: Secondary | ICD-10-CM | POA: Diagnosis not present

## 2021-04-15 DIAGNOSIS — I63512 Cerebral infarction due to unspecified occlusion or stenosis of left middle cerebral artery: Secondary | ICD-10-CM | POA: Diagnosis not present

## 2021-04-15 DIAGNOSIS — Z955 Presence of coronary angioplasty implant and graft: Secondary | ICD-10-CM | POA: Diagnosis not present

## 2021-04-15 DIAGNOSIS — N250 Renal osteodystrophy: Secondary | ICD-10-CM | POA: Diagnosis not present

## 2021-04-15 DIAGNOSIS — I63312 Cerebral infarction due to thrombosis of left middle cerebral artery: Secondary | ICD-10-CM | POA: Diagnosis not present

## 2021-04-15 DIAGNOSIS — Z888 Allergy status to other drugs, medicaments and biological substances status: Secondary | ICD-10-CM

## 2021-04-15 DIAGNOSIS — I679 Cerebrovascular disease, unspecified: Secondary | ICD-10-CM | POA: Diagnosis not present

## 2021-04-15 LAB — BASIC METABOLIC PANEL
Anion gap: 16 — ABNORMAL HIGH (ref 5–15)
BUN: 38 mg/dL — ABNORMAL HIGH (ref 8–23)
CO2: 27 mmol/L (ref 22–32)
Calcium: 8.7 mg/dL — ABNORMAL LOW (ref 8.9–10.3)
Chloride: 96 mmol/L — ABNORMAL LOW (ref 98–111)
Creatinine, Ser: 8.71 mg/dL — ABNORMAL HIGH (ref 0.61–1.24)
GFR, Estimated: 5 mL/min — ABNORMAL LOW (ref >60)
Glucose, Bld: 160 mg/dL — ABNORMAL HIGH (ref 70–99)
Potassium: 3.7 mmol/L (ref 3.5–5.1)
Sodium: 139 mmol/L (ref 135–145)

## 2021-04-15 LAB — RESP PANEL BY RT-PCR (FLU A&B, COVID) ARPGX2
Influenza A by PCR: NEGATIVE
Influenza B by PCR: NEGATIVE
SARS Coronavirus 2 by RT PCR: NEGATIVE

## 2021-04-15 LAB — CBC
HCT: 27.8 % — ABNORMAL LOW (ref 39.0–52.0)
Hemoglobin: 8.8 g/dL — ABNORMAL LOW (ref 13.0–17.0)
MCH: 32.6 pg (ref 26.0–34.0)
MCHC: 31.7 g/dL (ref 30.0–36.0)
MCV: 103 fL — ABNORMAL HIGH (ref 80.0–100.0)
Platelets: 293 10*3/uL (ref 150–400)
RBC: 2.7 MIL/uL — ABNORMAL LOW (ref 4.22–5.81)
RDW: 13.4 % (ref 11.5–15.5)
WBC: 8.9 10*3/uL (ref 4.0–10.5)
nRBC: 0 % (ref 0.0–0.2)

## 2021-04-15 LAB — CBG MONITORING, ED
Glucose-Capillary: 99 mg/dL (ref 70–99)
Glucose-Capillary: 97 mg/dL (ref 70–99)

## 2021-04-15 IMAGING — MR MR HEAD W/O CM
6 of 11 series · 24 of 48 positions shown · non-contrast
Comparison: CT head [DATE]

CLINICAL DATA: Neuro deficit, stroke suspected

EXAM:
MRI HEAD WITHOUT CONTRAST
TECHNIQUE: Multiplanar, multiecho pulse sequences of the brain and surrounding
structures were obtained without intravenous contrast.

[Series 2: DWI · axial · 3.0mm · 0.94mm/px · z∈[-80,+66]mm · 7 of 100 slices shown (1 of 2)]
[im 1/100]
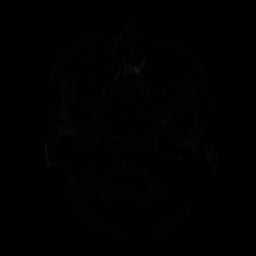
[im 17/100]
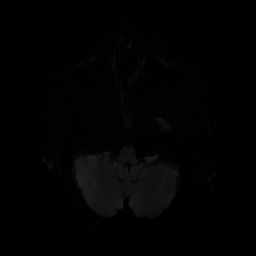
[im 34/100]
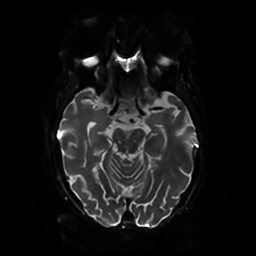
[im 50/100]
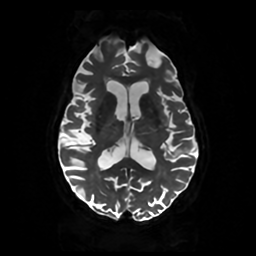
[im 67/100]
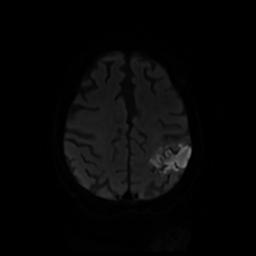
[im 83/100]
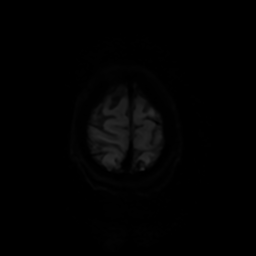
[im 100/100]
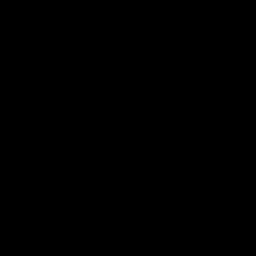

[Series 3: DWI · coronal · 4.0mm · 0.94mm/px · 5 of 74 slices shown (2 of 2)]
[im 1/74]
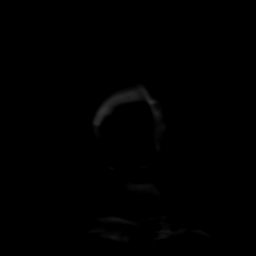
[im 19/74]
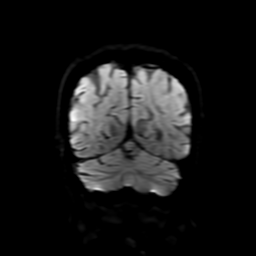
[im 37/74]
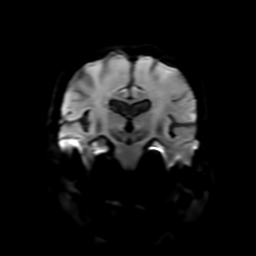
[im 55/74]
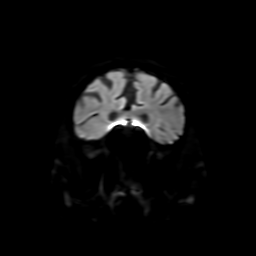
[im 74/74]
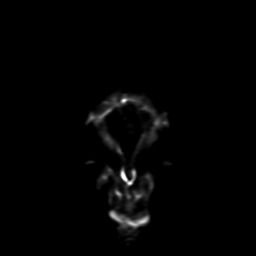

[Series 4: FLAIR · sagittal · 5.0mm · 0.23mm/px · 2 of 26 slices shown (1 of 2)]
[im 1/26]
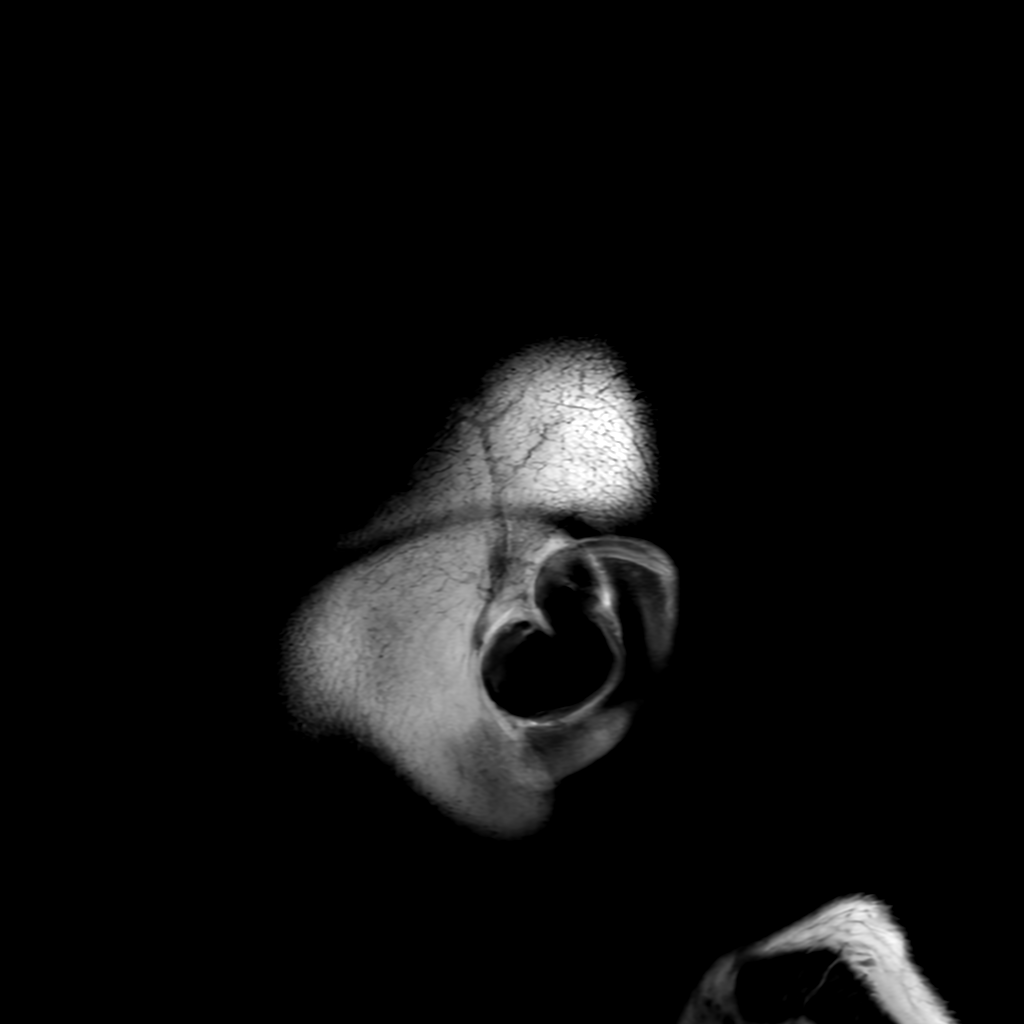
[im 26/26]
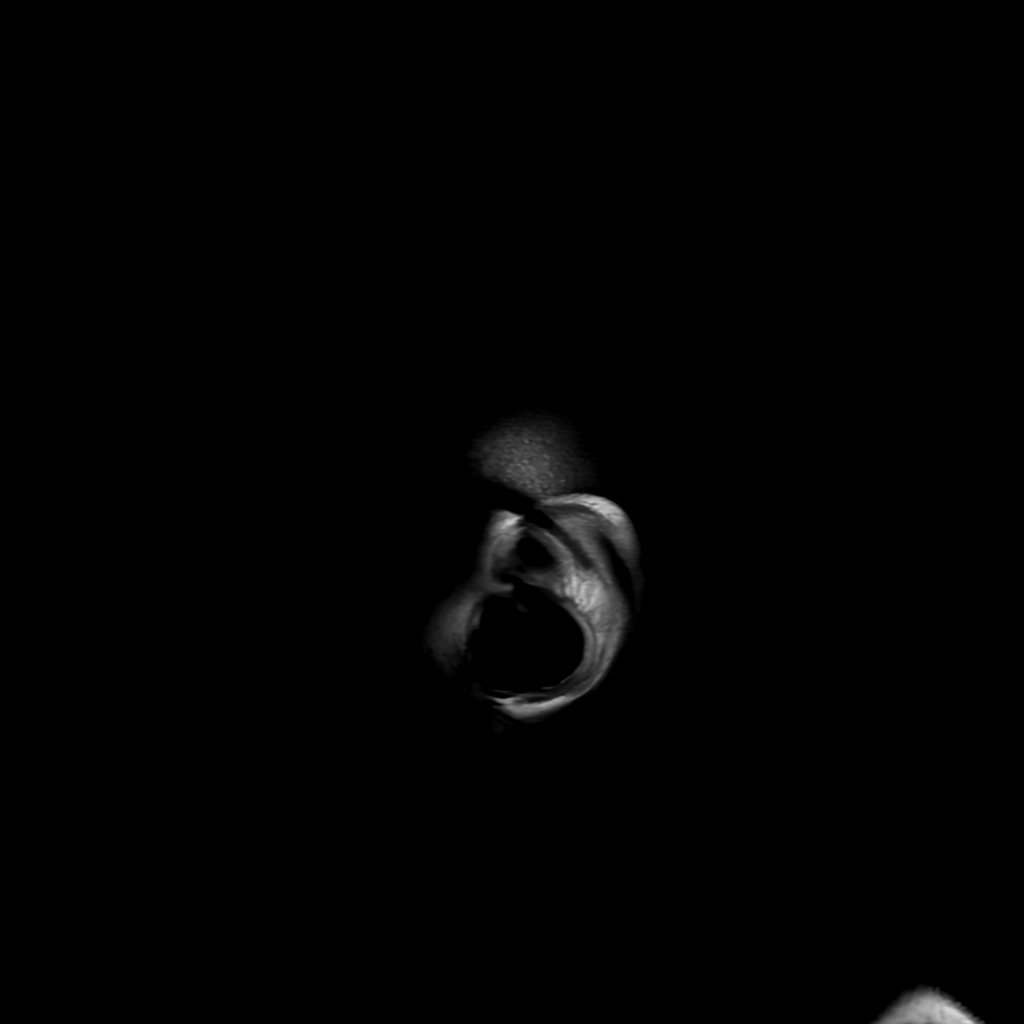

[Series 6: FLAIR · axial · 4.0mm · 0.45mm/px · z∈[-77,+72]mm · 3 of 35 slices shown (2 of 2)]
[im 1/35]
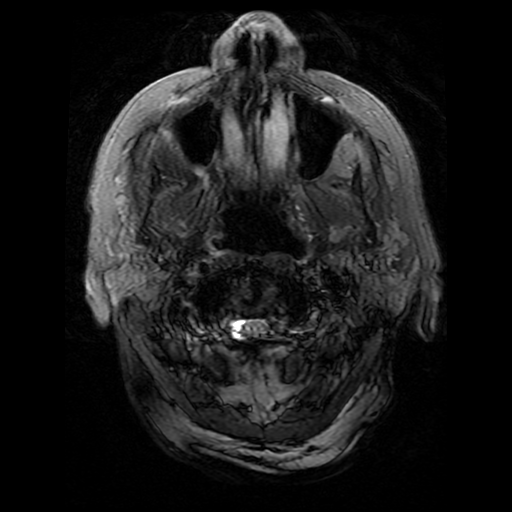
[im 18/35]
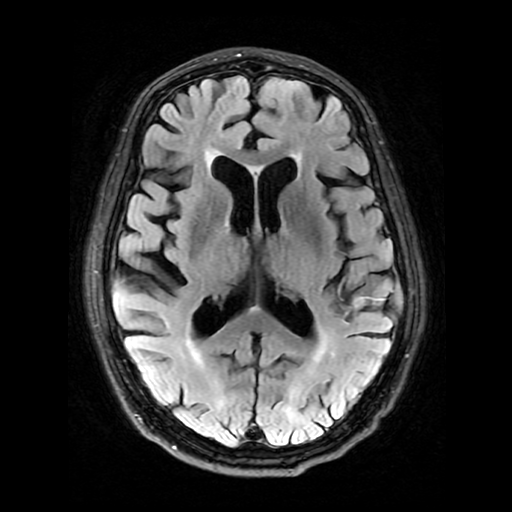
[im 35/35]
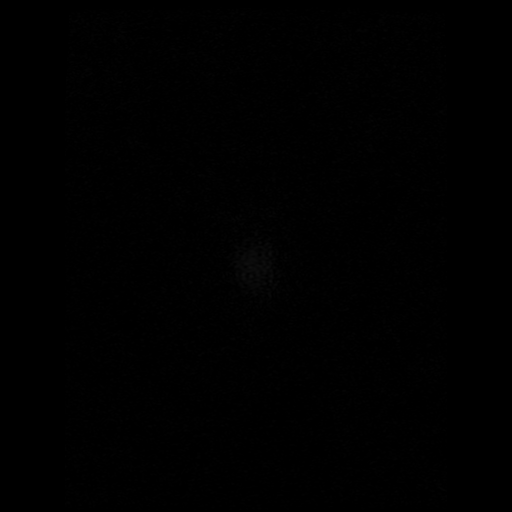

[Series 250: ADC · axial · 3.0mm · 0.94mm/px · z∈[-80,+63]mm · 4 of 49 slices shown (1 of 2)]
[im 1/49]
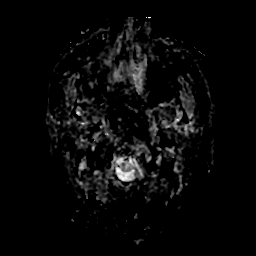
[im 17/49]
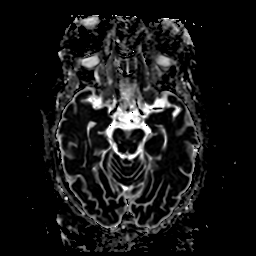
[im 33/49]
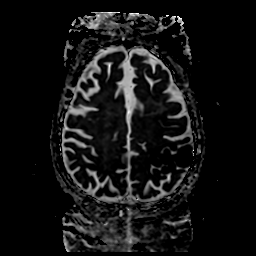
[im 49/49]
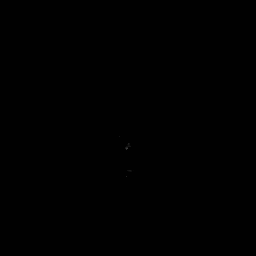

[Series 350: ADC · coronal · 4.0mm · 0.94mm/px · 3 of 37 slices shown (2 of 2)]
[im 1/37]
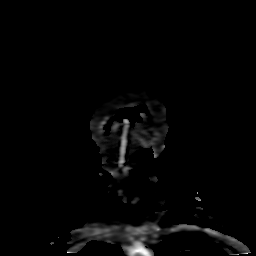
[im 19/37]
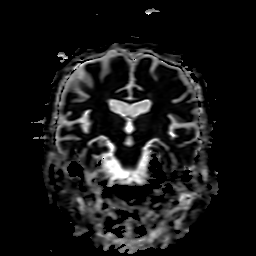
[im 37/37]
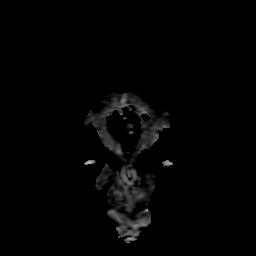

[24 of 48 positions shown; findings below may reference images not displayed]

FINDINGS: Brain: There is a moderate-sized area of diffusion restriction in
the left parietal lobe in the MCA distribution with associated
T2/FLAIR signal abnormality, likely reflecting early subacute
infarct. Additional punctate infarcts are seen in the left caudate
body and superior frontal gyrus. There are curvilinear foci of
susceptibility artifact within the infarct territory suggesting
petechial hemorrhage. There is mild sulcal effacement without
significant mass effect.

There is a background of mild-to-moderate global parenchymal volume
loss with prominence of the ventricular system and extra-axial CSF
spaces. Patchy foci of FLAIR signal abnormality in the remainder of
the subcortical and periventricular white matter likely reflects
sequela of mild chronic white matter microangiopathy.

There is no solid mass lesion.  There is no midline shift.

Vascular: Major intracranial flow voids are present. There is SWI
signal dropout in the left sylvian fissure corresponding to
calcifications seen on the recent CT, suspicious for a calcified
embolus causing the above-described infarct.

Skull and upper cervical spine: Normal marrow signal.

Sinuses/Orbits: The paranasal sinuses are clear. Bilateral lens
implants are in place. The globes and orbits are otherwise
unremarkable.

Other: None.
IMPRESSION: 1. Moderate-sized early subacute infarct in the left MCA
distribution, suspected due to a calcified embolus within the
sylvian fissure. Curvilinear SWI signal dropout within the infarct
territory is consistent with petechial hemorrhage. There is mild
edema with sulcal effacement but no midline shift.
2. Additional punctate infarcts in the left caudate body and
superior frontal gyrus.

## 2021-04-15 MED ORDER — CINACALCET HCL 30 MG PO TABS
30.0000 mg | ORAL_TABLET | ORAL | Status: DC
  Administered 2021-04-17: 30 mg via ORAL
  Filled 2021-04-15: qty 1

## 2021-04-15 MED ORDER — ASPIRIN 325 MG PO TABS
325.0000 mg | ORAL_TABLET | Freq: Once | ORAL | Status: AC
  Administered 2021-04-15: 325 mg via ORAL
  Filled 2021-04-15: qty 1

## 2021-04-15 MED ORDER — HEPARIN SODIUM (PORCINE) 5000 UNIT/ML IJ SOLN
5000.0000 [IU] | Freq: Three times a day (TID) | INTRAMUSCULAR | Status: DC
  Administered 2021-04-15 – 2021-04-17 (×5): 5000 [IU] via SUBCUTANEOUS
  Filled 2021-04-15 (×5): qty 1

## 2021-04-15 MED ORDER — CHLORHEXIDINE GLUCONATE CLOTH 2 % EX PADS
6.0000 | MEDICATED_PAD | Freq: Every day | CUTANEOUS | Status: DC
  Administered 2021-04-16: 6 via TOPICAL

## 2021-04-15 MED ORDER — BUDESONIDE 0.25 MG/2ML IN SUSP
0.2500 mg | Freq: Two times a day (BID) | RESPIRATORY_TRACT | Status: DC
  Administered 2021-04-15 – 2021-04-17 (×4): 0.25 mg via RESPIRATORY_TRACT
  Filled 2021-04-15 (×4): qty 2

## 2021-04-15 MED ORDER — ASPIRIN EC 81 MG PO TBEC
81.0000 mg | DELAYED_RELEASE_TABLET | Freq: Every day | ORAL | Status: DC
  Administered 2021-04-16 – 2021-04-17 (×2): 81 mg via ORAL
  Filled 2021-04-15 (×2): qty 1

## 2021-04-15 MED ORDER — ACETAMINOPHEN 500 MG PO TABS: 500.0000 mg | ORAL_TABLET | Freq: Three times a day (TID) | ORAL | Status: DC | PRN

## 2021-04-15 MED ORDER — ASPIRIN 300 MG RE SUPP: 300.0000 mg | Freq: Every day | RECTAL | Status: DC

## 2021-04-15 MED ORDER — ASPIRIN 325 MG PO TABS: 325.0000 mg | ORAL_TABLET | Freq: Every day | ORAL | Status: DC

## 2021-04-15 MED ORDER — CLOPIDOGREL BISULFATE 75 MG PO TABS
75.0000 mg | ORAL_TABLET | Freq: Every day | ORAL | Status: DC
  Administered 2021-04-16 – 2021-04-17 (×2): 75 mg via ORAL
  Filled 2021-04-15 (×2): qty 1

## 2021-04-15 MED ORDER — STROKE: EARLY STAGES OF RECOVERY BOOK
Freq: Once | Status: AC
  Filled 2021-04-15 (×2): qty 1

## 2021-04-15 MED ORDER — DARBEPOETIN ALFA 200 MCG/0.4ML IJ SOSY
200.0000 ug | PREFILLED_SYRINGE | INTRAMUSCULAR | Status: DC
  Administered 2021-04-16: 200 ug via INTRAVENOUS
  Filled 2021-04-15 (×2): qty 0.4

## 2021-04-15 MED ORDER — FLUTICASONE PROPIONATE(SENSOR) 232 MCG/ACT IN AEPB: 1.0000 | INHALATION_SPRAY | Freq: Two times a day (BID) | RESPIRATORY_TRACT | Status: DC

## 2021-04-15 MED ORDER — DIPHENHYDRAMINE HCL 25 MG PO CAPS: 25.0000 mg | ORAL_CAPSULE | Freq: Four times a day (QID) | ORAL | Status: DC | PRN

## 2021-04-15 MED ORDER — INSULIN ASPART 100 UNIT/ML IJ SOLN
0.0000 [IU] | Freq: Three times a day (TID) | INTRAMUSCULAR | Status: DC
  Administered 2021-04-16: 2 [IU] via SUBCUTANEOUS
  Administered 2021-04-17: 1 [IU] via SUBCUTANEOUS
  Administered 2021-04-17: 2 [IU] via SUBCUTANEOUS

## 2021-04-15 MED ORDER — LABETALOL HCL 5 MG/ML IV SOLN: 10.0000 mg | INTRAVENOUS | Status: DC | PRN

## 2021-04-15 MED ORDER — ATORVASTATIN CALCIUM 80 MG PO TABS
80.0000 mg | ORAL_TABLET | Freq: Every day | ORAL | Status: DC
Start: 2021-04-15 — End: 2021-04-18
  Administered 2021-04-15 – 2021-04-16 (×2): 80 mg via ORAL
  Filled 2021-04-15 (×2): qty 1

## 2021-04-15 NOTE — Assessment & Plan Note (Signed)
Takes 2 L of oxygen at home at baseline.  Declines history of COPD.  As per the wife, uses oxygen for fluid buildup in the lungs. ?

## 2021-04-15 NOTE — ED Notes (Signed)
Patient transported to MRI 

## 2021-04-15 NOTE — H&P (Signed)
?History and Physical  ? ? ?[name] CZY:[id number] DOB: 22-Mar-1930 DOA: 04/15/2021 ? ?PCP: Windy Fast, MD  ? ?Patient coming from: Home ? ? ? ?Chief Complaint: Weakness on the right side, confusion,slurred speech ? ?HPI: Nicholas [name] is a 88 y.o. male with medical history significant of hypertension, hyperlipidemia, ESRD on dialysis on TTS schedule ,coronary artery disease, diabetes type 2 who presented from home with strokelike symptoms.  Patient lives with wife.  Patient is a poor historian so information was also collected from his wife on phone.  As per the patient his wife, he was having intermittent episodes of confusion, slurred speech, weakness of her right hand over the past 3 to 4 days.  He initially presented to the ED on 3/21 with the symptoms.  CT head done at that time did not show any acute intracranial abnormalities.  He was then discharged to home. ?At home, he continued to have the symptoms.  As per the wife, this time his right-hand weakness was more pronounced .He was having difficulty writing or typing.  They  called 911 and presented again to the emergency department.   Code stroke was called on admission.There is no report of loss of consciousness, vision loss, confusion or seizure.   ?No report of fever, chills, cough, chest pain, shortness of breath, palpitations, abdominal pain, nausea, vomiting, diarrhea , hematochezia or melena. ?Patient seen and examined at the bedside in the emergency department.  During my evaluation, he was hemodynamically stable.  He was alert and oriented and denied any weakness on any side. ? ? ?ED Course: Initially hypertensive on presentation, blood pressure gradually normalized.  MRI of the brain showed  moderate-sized early subacute infarct in the left MCA distribution.,  Additional punctate infarcts in the left caudate body and superior frontal gyrus.  Admitted for stroke work-up.  Neurology consulted ? ?Review of Systems: As per HPI otherwise 10 point  review of systems negative.  ? ? ?Past Medical History:  ?Diagnosis Date  ? Arthritis   ? Chronic kidney disease   ? Diabetes mellitus ([place])   ? Dialysis patient [facility])   ? GERD (gastroesophageal reflux disease)   ? occ  ? Heart murmur   ? Hypercholesteremia   ? Hypertension   ? Psoriasis   ? ? ?Past Surgical History:  ?Procedure Laterality Date  ? CARDIAC CATHETERIZATION  09  ? "some blockage" with collaterals by cath at [facility] ~ 2009; no intervention required; reportedly, no routine cardiology f/u recommended   ? COLONOSCOPY WITH PROPOFOL N/A 07/06/2020  ? Procedure: COLONOSCOPY WITH PROPOFOL;  Surgeon: Ronald Lobo, MD;  Location: [facility];  Service: Endoscopy;  Laterality: N/A;  ? CORONARY ATHERECTOMY N/A 08/09/2018  ? Procedure: CORONARY ATHERECTOMY;  Surgeon: Jettie Booze, MD;  Location: [facility] CV LAB;  Service: Cardiovascular;  Laterality: N/A;  ? CORONARY STENT INTERVENTION N/A 08/09/2018  ? Procedure: CORONARY STENT INTERVENTION;  Surgeon: Jettie Booze, MD;  Location: [facility];  Service: Cardiovascular;  Laterality: N/A;  ? ESOPHAGOGASTRODUODENOSCOPY (EGD) WITH PROPOFOL N/A 07/06/2020  ? Procedure: ESOPHAGOGASTRODUODENOSCOPY (EGD) WITH PROPOFOL;  Surgeon: Ronald Lobo, MD;  Location: [facility];  Service: Endoscopy;  Laterality: N/A;  ? INGUINAL HERNIA REPAIR Right 04/13/2012  ? Procedure: HERNIA REPAIR INGUINAL INCARCERATED;  Surgeon: Gayland Curry, MD;  Location: [facility];  Service: General;  Laterality: Right;  ? INSERTION OF MESH Right 04/13/2012  ? Procedure: INSERTION OF MESH;  Surgeon: Gayland Curry, MD;  Location: [facility];  Service: General;  Laterality: Right;  ? IR THORACENTESIS ASP PLEURAL SPACE W/IMG GUIDE  06/08/2020  ? LEFT HEART CATH N/A 08/09/2018  ? Procedure: Left Heart Cath;  Surgeon: Jettie Booze, MD;  Location: [place] [facility];  Service: Cardiovascular;  Laterality: N/A;  ? LEFT HEART CATH AND CORONARY ANGIOGRAPHY N/A 08/08/2018  ? Procedure: LEFT  HEART CATH AND CORONARY ANGIOGRAPHY;  Surgeon: Troy Sine, MD;  Location: [facility];  Service: Cardiovascular;  Laterality: N/A;  ? LEFT HEART CATH AND CORONARY ANGIOGRAPHY N/A 06/05/2020  ? Procedure: LEFT HEART CATH AND CORONARY ANGIOGRAPHY;  Surgeon: Belva Crome, MD;  Location: [place] [facility];  Service: Cardiovascular;  Laterality: N/A;  ? VASECTOMY    ? ? ? reports that he quit smoking about 34 years ago. His smoking use included cigarettes. He has a 15.00 pack-year smoking history. He has never used smokeless tobacco. He reports that he does not drink alcohol and does not use drugs. ? ?Allergies  ?Allergen Reactions  ? Hydrochlorothiazide Other (See Comments)  ?  Unknown per [facility] records  ? ? ?Family History  ?Problem Relation Age of Onset  ? Diabetes Mother   ? Stroke Father   ? Diabetes Sister   ? Diabetes Brother   ? ? ? ?Prior to Admission medications   ?Medication Sig Start Date End Date Taking? Authorizing Provider  ?acetaminophen (TYLENOL) 500 MG tablet Take 500 mg by mouth 3 (three) times daily as needed for moderate pain.   Yes [provider]  ?atorvastatin (LIPITOR) 80 MG tablet Take 80 mg by mouth at bedtime.   Yes [provider]  ?clopidogrel (PLAVIX) 75 MG tablet Take 1 tablet (75 mg total) by mouth daily with breakfast. Restart after 5 days 09/18/20  Yes Domenic Polite, MD  ?diphenhydrAMINE (BENADRYL) 25 mg capsule Take 1 capsule by mouth as needed. 04/06/21  Yes [provider]  ?Fluticasone Propionate,sensor, (ARMONAIR DIGIHALER) 232 MCG/ACT AEPB Inhale 1 puff into the lungs 2 (two) times daily.   Yes [provider]  ?insulin NPH-regular Human (70-30) 100 UNIT/ML injection Inject 10 Units into the skin daily with breakfast. Takes 20 units in the morning if blood sugar is over 140 09/13/20  Yes Domenic Polite, MD  ?isosorbide dinitrate (ISORDIL) 20 MG tablet Take 20 mg by mouth 2 (two) times daily. 09/18/20  Yes [provider]   ?nitroGLYCERIN (NITROSTAT) 0.4 MG SL tablet Place 1 tablet (0.4 mg total) under the tongue every 5 (five) minutes x 3 doses as needed for chest pain. 08/10/18  Yes [name], MD  ?cinacalcet (SENSIPAR) 30 MG tablet Take by mouth 3 (three) times a week. Harrell Lark, Sat 09/18/20   [provider]  ? ? ?Physical Exam: ?Vitals:  ? 04/15/21 1618 04/15/21 1630 04/15/21 1700 04/15/21 1730  ?BP: 120/72 132/75 (!) 161/74 122/70  ?Pulse: 96 94 92 94  ?Resp: (!) 22 (!) '21 20 20  '$ ?Temp:      ?TempSrc:      ?SpO2: 100% 100% 92% 99%  ?Weight:      ?Height:      ? ? ?Constitutional: NAD, calm, comfortable ?Vitals:  ? 04/15/21 1618 04/15/21 1630 04/15/21 1700 04/15/21 1730  ?BP: 120/72 132/75 (!) 161/74 122/70  ?Pulse: 96 94 92 94  ?Resp: (!) 22 (!) '21 20 20  '$ ?Temp:      ?TempSrc:      ?SpO2: 100% 100% 92% 99%  ?Weight:      ?  Height:      ? ?Eyes: PERRL, lids and conjunctivae normal ?ENMT: Mucous membranes are moist.  ?Neck: normal, supple, no masses, no thyromegaly ?Respiratory: clear to auscultation bilaterally, no wheezing, no crackles. Normal respiratory effort. No accessory muscle use.  ?Cardiovascular: Regular rate and rhythm, no murmurs / rubs / gallops. No extremity edema.  ?Abdomen: no tenderness, no masses palpated. No hepatosplenomegaly. Bowel sounds positive.  ?Musculoskeletal: no clubbing / cyanosis. No joint deformity upper and lower extremities.  AV fistula on the left arm ?Skin: no rashes, lesions, ulcers. No induration ?Neurologic: CN 2-12 grossly intact.  Strength 5/5 in all 4.  ?Psychiatric: Normal judgment and insight. Alert and oriented x 3. Normal mood.  ? ?Foley Catheter:None ? ?Labs on Admission: I have personally reviewed following labs and imaging studies ? ?CBC: ?Recent Labs  ?Lab 04/13/21 ?2033 04/15/21 ?1040  ?WBC 8.6 8.9  ?NEUTROABS 6.5  --   ?HGB 9.0* 8.8*  ?HCT 27.2* 27.8*  ?MCV 100.0 103.0*  ?PLT 287 293  ? ?Basic Metabolic Panel: ?Recent Labs  ?Lab 04/13/21 ?2033 04/15/21 ?1040   ?NA 136 139  ?K 3.5 3.7  ?CL 92* 96*  ?CO2 32 27  ?GLUCOSE 215* 160*  ?BUN 24* 38*  ?CREATININE 4.85* 8.71*  ?CALCIUM 8.7* 8.7*  ? ?GFR: ?Estimated Creatinine Clearance: 5.7 mL/min (A) (by C-G formula based on

## 2021-04-15 NOTE — Assessment & Plan Note (Addendum)
Dialyzed on TTS schedule.  Currently appears euvolemic. Nephrology following ?

## 2021-04-15 NOTE — Progress Notes (Signed)
I was informed of this patients admission for code stroke.  Today is his dialysis day but there are no acute needs for dialysis tonight.  I was able to find his dialysis orders from the [facility]-  we will plan to run him tomorrow  ? ? [facility]  [facility] hours, 2/2.5 bath, BFR 400 via left AVF-  EDW 70- runs on a revaclear max.  Last hgb was in the 8's ? ?Full consult to be done tomorrow  ? ?Louis Meckel ? ?

## 2021-04-15 NOTE — Assessment & Plan Note (Addendum)
Takes insulin at home.  Continue sliding-scale insulin for now.  Monitor blood sugars.hemoglobin A1c of 7.6 ?

## 2021-04-15 NOTE — Plan of Care (Signed)
m °

## 2021-04-15 NOTE — Assessment & Plan Note (Addendum)
S/P stent placement .Takes Plavix, statin at home which we will continue. ?

## 2021-04-15 NOTE — Assessment & Plan Note (Addendum)
Associated with chronic kidney disease.  Hemoglobin stable in the range of 8-9 ?

## 2021-04-15 NOTE — ED Provider Notes (Addendum)
?[facility] ?Provider Note ? ? ?CSN: [id number] ?Arrival date & time: 04/15/21  4196 ? ?  ? ?History ? ?Chief Complaint  ?Patient presents with  ? stroke sx  ? ? ?Nicholas [name] is a 88 y.o. male. ? ?Pt with hx esrd/hd t/th/sat presents family indicating seems as if impaired dexterity of right hand in past 3-4 days - giving example that his ability to write and or type into computer seems impaired. Also have noticed his ability to do things such as play solitaire/remembering rules is impaired. Pt denies specific physical c/o. No fever or chills. No headache. No change in speech or vision. Denies one side of face or body numbness or weakness. No neck or back/spine pain. No chest pain or sob. No abd pain or nv. EMS was called this AM, noted glucose mildly low at 62, gave fluids/food and repeat normal.  ? ?The history is provided by medical records, the EMS personnel and a relative.  ? ?  ? ?Home Medications ?Prior to Admission medications   ?Medication Sig Start Date End Date Taking? Authorizing Provider  ?acetaminophen (TYLENOL) 500 MG tablet Take 500 mg by mouth 3 (three) times daily as needed for moderate pain.    [provider]  ?atorvastatin (LIPITOR) 80 MG tablet Take 80 mg by mouth at bedtime.    [provider]  ?cinacalcet (SENSIPAR) 30 MG tablet Take by mouth 3 (three) times a week. Harrell Lark, Sat 09/18/20   [provider]  ?clopidogrel (PLAVIX) 75 MG tablet Take 1 tablet (75 mg total) by mouth daily with breakfast. Restart after 5 days 09/18/20   Domenic Polite, MD  ?Fluticasone Propionate,sensor, (ARMONAIR DIGIHALER) 232 MCG/ACT AEPB Inhale 1 puff into the lungs 2 (two) times daily.    [provider]  ?insulin NPH-regular Human (70-30) 100 UNIT/ML injection Inject 10 Units into the skin daily with breakfast. Takes 20 units in the morning if blood sugar is over 140 09/13/20   Domenic Polite, MD  ?isosorbide dinitrate (ISORDIL) 20 MG  tablet Take 20 mg by mouth 2 (two) times daily. 09/18/20   [provider]  ?Multiple Vitamin (MULTIVITAMIN WITH MINERALS) TABS tablet Take 1 tablet by mouth daily.    [provider]  ?nitroGLYCERIN (NITROSTAT) 0.4 MG SL tablet Place 1 tablet (0.4 mg total) under the tongue every 5 (five) minutes x 3 doses as needed for chest pain. 08/10/18   [name], MD  ?Nutritional Supplements (NEPRO/CARBSTEADY PO) Take 237 mLs by mouth daily. vanilla    [provider]  ?sevelamer carbonate (RENVELA) 800 MG tablet Take 1,600 mg by mouth 3 (three) times daily. 05/22/19   [provider]  ?   ? ?Allergies    ?Hydrochlorothiazide   ? ?Review of Systems   ?Review of Systems  ?Constitutional:  Negative for chills and fever.  ?HENT:  Negative for sore throat and trouble swallowing.   ?Eyes:  Negative for visual disturbance.  ?Respiratory:  Negative for shortness of breath.   ?Cardiovascular:  Negative for chest pain.  ?Gastrointestinal:  Negative for abdominal pain, diarrhea and vomiting.  ?Genitourinary:  Negative for dysuria and flank pain.  ?Musculoskeletal:  Negative for back pain and neck pain.  ?Skin:  Negative for rash.  ?Neurological:  Negative for speech difficulty and headaches.  ?Hematological:  Does not bruise/bleed easily.  ?Psychiatric/Behavioral:  Negative for agitation.   ? ?Physical Exam ?Updated Vital Signs ?BP 107/62   Pulse 98   Temp 98.3 ?  F (36.8 ?C) (Oral)   Resp 14   Ht 1.727 m ('5\' 8"'$ )   Wt 71.8 kg   SpO2 96%   BMI 24.07 kg/m?  ?Physical Exam ?Vitals and nursing note reviewed.  ?Constitutional:   ?   Appearance: Normal appearance. He is well-developed.  ?HENT:  ?   Head: Atraumatic.  ?   Nose: Nose normal.  ?   Mouth/Throat:  ?   Mouth: Mucous membranes are moist.  ?   Pharynx: Oropharynx is clear.  ?Eyes:  ?   General: No scleral icterus. ?   Conjunctiva/sclera: Conjunctivae normal.  ?   Pupils: Pupils are equal, round, and reactive to light.  ?Neck:  ?    Vascular: No carotid bruit.  ?   Trachea: No tracheal deviation.  ?Cardiovascular:  ?   Rate and Rhythm: Normal rate and regular rhythm.  ?   Pulses: Normal pulses.  ?   Heart sounds: Normal heart sounds. No murmur heard. ?  No friction rub. No gallop.  ?Pulmonary:  ?   Effort: Pulmonary effort is normal. No accessory muscle usage or respiratory distress.  ?   Breath sounds: Normal breath sounds.  ?Abdominal:  ?   General: Bowel sounds are normal. There is no distension.  ?   Palpations: Abdomen is soft.  ?   Tenderness: There is no abdominal tenderness.  ?Genitourinary: ?   Comments: No cva tenderness. ?Musculoskeletal:     ?   General: No swelling.  ?   Cervical back: Normal range of motion and neck supple. No rigidity.  ?   Right lower leg: No edema.  ?   Left lower leg: No edema.  ?   Comments: CTLS spine, non tender, aligned, no step off. ?Good rom bil extremities without pain or focal bony tenderness.   ?Skin: ?   General: Skin is warm and dry.  ?   Findings: No rash.  ?Neurological:  ?   Mental Status: He is alert and oriented to person, place, and time.  ?   Cranial Nerves: No cranial nerve deficit.  ?   Comments: Alert, speech clear. No gross dysarthria or aphasia noted. No facial asymmetry or droop. Right hand, will weakness and impaired dexterity compared to left. ?mild right leg weakness.  No pronator drift. Sens grossly intact.   ?Psychiatric:     ?   Mood and Affect: Mood normal.  ? ? ?ED Results / Procedures / Treatments   ?Labs ?(all labs ordered are listed, but only abnormal results are displayed) ?Results for orders placed or performed during the hospital encounter of 04/15/21  ?Basic metabolic panel  ?Result Value Ref Range  ? Sodium 139 135 - 145 mmol/L  ? Potassium 3.7 3.5 - 5.1 mmol/L  ? Chloride 96 (L) 98 - 111 mmol/L  ? CO2 27 22 - 32 mmol/L  ? Glucose, Bld 160 (H) 70 - 99 mg/dL  ? BUN 38 (H) 8 - 23 mg/dL  ? Creatinine, Ser 8.71 (H) 0.61 - 1.24 mg/dL  ? Calcium 8.7 (L) 8.9 - 10.3 mg/dL  ?  GFR, Estimated 5 (L) >60 mL/min  ? Anion gap 16 (H) 5 - 15  ?CBC  ?Result Value Ref Range  ? WBC 8.9 4.0 - 10.5 K/uL  ? RBC 2.70 (L) 4.22 - 5.81 MIL/uL  ? Hemoglobin 8.8 (L) 13.0 - 17.0 g/dL  ? HCT 27.8 (L) 39.0 - 52.0 %  ? MCV 103.0 (H) 80.0 - 100.0 fL  ? MCH  32.6 26.0 - 34.0 pg  ? MCHC 31.7 30.0 - 36.0 g/dL  ? RDW 13.4 11.5 - 15.5 %  ? Platelets 293 150 - 400 K/uL  ? nRBC 0.0 0.0 - 0.2 %  ?CBG monitoring, ED  ?Result Value Ref Range  ? Glucose-Capillary 99 70 - 99 mg/dL  ? ?CT Head Wo Contrast ? ?Result Date: 04/13/2021 ?CLINICAL DATA:  Altered mental status. EXAM: CT HEAD WITHOUT CONTRAST TECHNIQUE: Contiguous axial images were obtained from the base of the skull through the vertex without intravenous contrast. RADIATION DOSE REDUCTION: This exam was performed according to the departmental dose-optimization program which includes automated exposure control, adjustment of the mA and/or kV according to patient size and/or use of iterative reconstruction technique. COMPARISON:  None. FINDINGS: Brain: Normal for age atrophy. No intracranial hemorrhage, mass effect, or midline shift. No hydrocephalus. The basilar cisterns are patent. Mild for age chronic small vessel ischemic change. No evidence of territorial infarct or acute ischemia. No extra-axial or intracranial fluid collection. Vascular: Atherosclerosis of skullbase vasculature without hyperdense vessel or abnormal calcification. Skull: No fracture or focal lesion. Sinuses/Orbits: Paranasal sinuses and mastoid air cells are clear. The visualized orbits are unremarkable. Bilateral cataract resection. Other: None. IMPRESSION: 1. No acute intracranial abnormality. 2. Age-related atrophy and chronic small vessel ischemic change. Electronically Signed   By: Keith Rake M.D.   On: 04/13/2021 20:37  ? ?MR Brain Wo Contrast (neuro protocol) ? ?Result Date: 04/15/2021 ?CLINICAL DATA:  Neuro deficit, stroke suspected EXAM: MRI HEAD WITHOUT CONTRAST TECHNIQUE:  Multiplanar, multiecho pulse sequences of the brain and surrounding structures were obtained without intravenous contrast. COMPARISON:  CT head 04/13/2021 FINDINGS: Brain: There is a moderate-sized area of diffusion rest

## 2021-04-15 NOTE — ED Triage Notes (Signed)
Pt arrived via [facility] from home for c/o lack of coordination of right hand that started 4 days ago. Pt's LKW 04/11/2021 at 1100. Pt dialysis pt Tues, Wed, Sat. Pt hasn't gone to dialysis yet today. Per EMS, original glucose was 62, they had pt eat a banana and drink orange juice and it brought the glucose up to 93. Per EMS, pt told them the hypotension was normal for him and it was asymptomatic. Pt is A&Ox4. VSS ?

## 2021-04-15 NOTE — Patient Outreach (Addendum)
Received a referral notification for Nicholas [name]. ?I have notified our [place] that he is in the hospital.  ?  ?Arville Care, CBCS, CMAA ?[facility] Management Assistant ?[facility] ?[phone]   ?

## 2021-04-15 NOTE — Assessment & Plan Note (Addendum)
Takes Lipitor 80 mg daily at home which will be continued. LDL of 116 ?

## 2021-04-15 NOTE — Assessment & Plan Note (Signed)
Chest x-ray done on 3/21 showed questionable 11 mm nodular density in the right lung apex.  Radiology recommends follow-up chest CT, can be done as an outpatient. ?

## 2021-04-15 NOTE — Assessment & Plan Note (Addendum)
Presented with right hand weakness, intermittent episodes of confusion, slurred speech.  These symptoms have completely resolved now.  ? Code stroke was called in the ED. ?MRI showed moderate-sized early subacute infarct in the left MCA distribution,additional punctate infarcts in the left caudate body and superior frontal gyrus. ?Stroke work-up initiated.  Neurology following ?Given a dose of aspirin 325 mg once.  Continue aspirin 81 mg.continue Plavix that he takes at home. Continue Lipitor. ?A1c of 7.6,LDL of 116 ?PT/OT/speech evaluation ?Echocardiogram has been ordered.  Frequent neurochecks. ?

## 2021-04-15 NOTE — Assessment & Plan Note (Addendum)
Mildly hypotensive on presentation.  Takes Imdur at home, will hold for now. Use as needed medication for severe hypertension ?

## 2021-04-15 NOTE — Consult Note (Addendum)
Neurology Consultation ?Reason for Consult: Stroke on MRI ?Requesting Physician: Lajean Saver ? ?CC: Right hand weakness ? ?History is obtained from: Patient and chart review  ? ?HPI: Nicholas [name] is a 88 y.o. male with a past medical history significant for hypertension, hyperlipidemia, diabetes, chronic kidney disease on dialysis, psoriasis, 2 L home oxygen use for "fluid buildup in the lungs" ? ?Notably, he presented to the ED on 3/21 with an episode of confusion, slow speech and movements overall and lethargy, without clear lateralization of weakness, he was also hypoglycemic and felt that his symptoms improved dramatically with glucose administration. Per notes he successfully completed dialysis and appeared back to neurological baseline.  Head CT was read as negative, and he was discharged.  However he represented due to recurrent symptoms and MRI demonstrates a sizable left parietal stroke ? ?Patient is able to give me a good history including recounting his hypoglycemia and that he had dialysis on Tuesday and is due today, though he did erroneously state that he came to the ED on Wednesday when he in fact came Tuesday.  He has noted that at times his words have been a bit jumbled and pronunciation but he has not had difficulty understanding people.  He also has noticed difficulty focusing for example being unable to play solitaire and feeling like he is unable to shave his face this morning (though notably this was not due to any perceived weakness per his report to me) ? ?LKW: 3-4 days PTA ?tPA given?: No, out of the window ?IA performed?: No, out of the window ?Premorbid modified rankin scale:  0 - 1 ? ?ROS: All other review of systems was negative except as noted in the HPI ? ?Past Medical History:  ?Diagnosis Date  ? Arthritis   ? Chronic kidney disease   ? Diabetes mellitus ([place])   ? Dialysis patient [facility])   ? GERD (gastroesophageal reflux disease)   ? occ  ? Heart murmur   ? Hypercholesteremia   ?  Hypertension   ? Psoriasis   ? ?Past Surgical History:  ?Procedure Laterality Date  ? CARDIAC CATHETERIZATION  09  ? "some blockage" with collaterals by cath at [facility] ~ 2009; no intervention required; reportedly, no routine cardiology f/u recommended   ? COLONOSCOPY WITH PROPOFOL N/A 07/06/2020  ? Procedure: COLONOSCOPY WITH PROPOFOL;  Surgeon: Ronald Lobo, MD;  Location: [facility];  Service: Endoscopy;  Laterality: N/A;  ? CORONARY ATHERECTOMY N/A 08/09/2018  ? Procedure: CORONARY ATHERECTOMY;  Surgeon: Jettie Booze, MD;  Location: [facility];  Service: Cardiovascular;  Laterality: N/A;  ? CORONARY STENT INTERVENTION N/A 08/09/2018  ? Procedure: CORONARY STENT INTERVENTION;  Surgeon: Jettie Booze, MD;  Location: [facility];  Service: Cardiovascular;  Laterality: N/A;  ? ESOPHAGOGASTRODUODENOSCOPY (EGD) WITH PROPOFOL N/A 07/06/2020  ? Procedure: ESOPHAGOGASTRODUODENOSCOPY (EGD) WITH PROPOFOL;  Surgeon: Ronald Lobo, MD;  Location: [facility];  Service: Endoscopy;  Laterality: N/A;  ? INGUINAL HERNIA REPAIR Right 04/13/2012  ? Procedure: HERNIA REPAIR INGUINAL INCARCERATED;  Surgeon: Gayland Curry, MD;  Location: [facility];  Service: General;  Laterality: Right;  ? INSERTION OF MESH Right 04/13/2012  ? Procedure: INSERTION OF MESH;  Surgeon: Gayland Curry, MD;  Location: [facility];  Service: General;  Laterality: Right;  ? IR THORACENTESIS ASP PLEURAL SPACE W/IMG GUIDE  06/08/2020  ? LEFT HEART CATH N/A 08/09/2018  ? Procedure: Left Heart Cath;  Surgeon: Jettie Booze, MD;  Location: [facility];  Service: Cardiovascular;  Laterality: N/A;  ? LEFT HEART CATH AND CORONARY ANGIOGRAPHY N/A 08/08/2018  ? Procedure: LEFT HEART CATH AND CORONARY ANGIOGRAPHY;  Surgeon: Troy Sine, MD;  Location: [facility];  Service: Cardiovascular;  Laterality: N/A;  ? LEFT HEART CATH AND CORONARY ANGIOGRAPHY N/A 06/05/2020  ? Procedure: LEFT HEART CATH AND CORONARY ANGIOGRAPHY;  Surgeon:  Belva Crome, MD;  Location: [facility];  Service: Cardiovascular;  Laterality: N/A;  ? VASECTOMY    ? ?Current Outpatient Medications  ?Medication Instructions  ? acetaminophen (TYLENOL) 500 mg, Oral, 3 times daily PRN  ? atorvastatin (LIPITOR) 80 mg, Oral, Daily at bedtime  ? cinacalcet (SENSIPAR) 30 MG tablet Oral, 3 times weekly, Tues, Thur, Sat  ? clopidogrel (PLAVIX) 75 mg, Oral, Daily with breakfast, Restart after 5 days  ? diphenhydrAMINE (BENADRYL) 25 mg capsule 1 capsule, Oral, As needed  ? Fluticasone Propionate,sensor, (ARMONAIR DIGIHALER) 232 MCG/ACT AEPB 1 puff, Inhalation, 2 times daily  ? insulin NPH-regular Human (70-30) 100 UNIT/ML injection 10 Units, Subcutaneous, Daily with breakfast, Takes 20 units in the morning if blood sugar is over 140  ? isosorbide dinitrate (ISORDIL) 20 mg, Oral, 2 times daily  ? nitroGLYCERIN (NITROSTAT) 0.4 mg, Sublingual, Every 5 min x3 PRN  ? ? ? ?Family History  ?Problem Relation Age of Onset  ? Diabetes Mother   ? Stroke Father   ? Diabetes Sister   ? Diabetes Brother   ? ? ?Social History:  reports that he quit smoking about 34 years ago. His smoking use included cigarettes. He has a 15.00 pack-year smoking history. He has never used smokeless tobacco. He reports that he does not drink alcohol and does not use drugs. ? ? ?Exam: ?Current vital signs: ?BP 120/72   Pulse 96   Temp 98.3 ?F (36.8 ?C) (Oral)   Resp (!) 22   Ht '5\' 8"'$  (1.727 m)   Wt 71.8 kg   SpO2 100%   BMI 24.07 kg/m?  ?Vital signs in last 24 hours: ?Temp:  [98.3 ?F (36.8 ?C)] 98.3 ?F (36.8 ?C) (03/23 7741) ?Pulse Rate:  [89-98] 96 (03/23 1618) ?Resp:  [14-28] 22 (03/23 1618) ?BP: (93-123)/(43-72) 120/72 (03/23 1618) ?SpO2:  [92 %-100 %] 100 % (03/23 1618) ?Weight:  [71.8 kg] 71.8 kg (03/23 0928) ? ? ?Physical Exam  ?Constitutional: Appears well-developed and well-nourished.  ?Psych: Affect appropriate to situation, calm and cooperative  ?Eyes: No scleral injection ?HENT: No oropharyngeal  obstruction.  ?MSK: no joint deformities.  ?Cardiovascular: Perfusing extremities well ?Respiratory: Effort normal, non-labored breathing ?GI: Soft.  No distension. There is no tenderness.  ?Skin: Warm dry and intact visible skin ? ?Neuro: ?Mental Status: ?Patient is awake, alert, oriented to person, place, month, year, and situation. ?Patient is able to give a clear and coherent history, though at times a bit tangential. ? ?Cranial Nerves: ?II: Visual Fields are full to confrontation, intermittently he does not report the fingers in the upper fields bilaterally correctly, but on subsequent testing he seems to orient readily to stimuli in all 4 fields. Pupils are equal, round, and reactive to light.   ?III,IV, VI: EOMI without ptosis or diploplia.  ?V: Facial sensation is symmetric to temperature ?VII: Facial movement is symmetric.  ?VIII: hearing is intact to voice ?X: Uvula elevates symmetrically ?XI: Shoulder shrug is symmetric. ?XII: tongue is midline without atrophy or fasciculations.  ?Motor: ?5/5 strength was present in all four extremities.  ?Sensory: ?Sensation is symmetric to light touch  and temperature in the arms and legs. ?Deep Tendon Reflexes: ?2+ and symmetric in the biceps ?Cerebellar: ?FNF is impaired in the right upper extremity and HKS is intact bilaterally ?Gait:  ?Deferred in acute setting  ? ?NIHSS total 2 ?Score breakdown: One-point for ataxia of the right upper extremity and one-point for mild aphasia ?Performed at 6:45 PM ? ? ?I have reviewed labs in epic and the results pertinent to this consultation are: ? ? ?Basic Metabolic Panel: ?Recent Labs  ?Lab 04/13/21 ?2033 04/15/21 ?1040  ?NA 136 139  ?K 3.5 3.7  ?CL 92* 96*  ?CO2 32 27  ?GLUCOSE 215* 160*  ?BUN 24* 38*  ?CREATININE 4.85* 8.71*  ?CALCIUM 8.7* 8.7*  ? ? ?CBC: ?Recent Labs  ?Lab 04/13/21 ?2033 04/15/21 ?1040  ?WBC 8.6 8.9  ?NEUTROABS 6.5  --   ?HGB 9.0* 8.8*  ?HCT 27.2* 27.8*  ?MCV 100.0 103.0*  ?PLT 287 293  ? ? ?Coagulation  Studies: ?No results for input(s): LABPROT, INR in the last 72 hours.  ? ? ?Lab Results  ?Component Value Date  ? CHOL 198 06/05/2020  ? HDL 62 06/05/2020  ? LDLCALC 122 (H) 06/05/2020  ? TRIG 71 06/05/2020  ? CHOLHDL

## 2021-04-16 ENCOUNTER — Inpatient Hospital Stay (HOSPITAL_COMMUNITY): Payer: Medicare Other

## 2021-04-16 ENCOUNTER — Other Ambulatory Visit (HOSPITAL_COMMUNITY): Payer: Medicare Other

## 2021-04-16 DIAGNOSIS — Z992 Dependence on renal dialysis: Secondary | ICD-10-CM

## 2021-04-16 DIAGNOSIS — I9589 Other hypotension: Secondary | ICD-10-CM

## 2021-04-16 DIAGNOSIS — Z794 Long term (current) use of insulin: Secondary | ICD-10-CM

## 2021-04-16 DIAGNOSIS — I10 Essential (primary) hypertension: Secondary | ICD-10-CM | POA: Diagnosis not present

## 2021-04-16 DIAGNOSIS — I6389 Other cerebral infarction: Secondary | ICD-10-CM | POA: Diagnosis not present

## 2021-04-16 DIAGNOSIS — I63512 Cerebral infarction due to unspecified occlusion or stenosis of left middle cerebral artery: Secondary | ICD-10-CM

## 2021-04-16 DIAGNOSIS — N186 End stage renal disease: Secondary | ICD-10-CM

## 2021-04-16 DIAGNOSIS — I679 Cerebrovascular disease, unspecified: Secondary | ICD-10-CM

## 2021-04-16 DIAGNOSIS — E1122 Type 2 diabetes mellitus with diabetic chronic kidney disease: Secondary | ICD-10-CM

## 2021-04-16 DIAGNOSIS — I639 Cerebral infarction, unspecified: Secondary | ICD-10-CM

## 2021-04-16 DIAGNOSIS — D638 Anemia in other chronic diseases classified elsewhere: Secondary | ICD-10-CM

## 2021-04-16 DIAGNOSIS — E7800 Pure hypercholesterolemia, unspecified: Secondary | ICD-10-CM

## 2021-04-16 LAB — GLUCOSE, CAPILLARY
Glucose-Capillary: 155 mg/dL — ABNORMAL HIGH (ref 70–99)
Glucose-Capillary: 80 mg/dL (ref 70–99)
Glucose-Capillary: 181 mg/dL — ABNORMAL HIGH (ref 70–99)
Glucose-Capillary: 189 mg/dL — ABNORMAL HIGH (ref 70–99)

## 2021-04-16 LAB — CBC WITH DIFFERENTIAL/PLATELET
Abs Immature Granulocytes: 0.01 10*3/uL (ref 0.00–0.07)
Basophils Absolute: 0 10*3/uL (ref 0.0–0.1)
Basophils Relative: 1 %
Eosinophils Absolute: 0.2 10*3/uL (ref 0.0–0.5)
Eosinophils Relative: 4 %
HCT: 24 % — ABNORMAL LOW (ref 39.0–52.0)
Hemoglobin: 8.1 g/dL — ABNORMAL LOW (ref 13.0–17.0)
Immature Granulocytes: 0 %
Lymphocytes Relative: 11 %
Lymphs Abs: 0.8 10*3/uL (ref 0.7–4.0)
MCH: 33.6 pg (ref 26.0–34.0)
MCHC: 33.8 g/dL (ref 30.0–36.0)
MCV: 99.6 fL (ref 80.0–100.0)
Monocytes Absolute: 0.7 10*3/uL (ref 0.1–1.0)
Monocytes Relative: 11 %
Neutro Abs: 4.8 10*3/uL (ref 1.7–7.7)
Neutrophils Relative %: 73 %
Platelets: 272 10*3/uL (ref 150–400)
RBC: 2.41 MIL/uL — ABNORMAL LOW (ref 4.22–5.81)
RDW: 13.2 % (ref 11.5–15.5)
WBC: 6.6 10*3/uL (ref 4.0–10.5)
nRBC: 0 % (ref 0.0–0.2)

## 2021-04-16 LAB — LIPID PANEL
Cholesterol: 198 mg/dL (ref 0–200)
HDL: 65 mg/dL (ref >40)
LDL Cholesterol: 116 mg/dL — ABNORMAL HIGH (ref 0–99)
Total CHOL/HDL Ratio: 3 RATIO
Triglycerides: 87 mg/dL (ref <150)
VLDL: 17 mg/dL (ref 0–40)

## 2021-04-16 LAB — BASIC METABOLIC PANEL
Anion gap: 13 (ref 5–15)
BUN: 45 mg/dL — ABNORMAL HIGH (ref 8–23)
CO2: 30 mmol/L (ref 22–32)
Calcium: 8.5 mg/dL — ABNORMAL LOW (ref 8.9–10.3)
Chloride: 99 mmol/L (ref 98–111)
Creatinine, Ser: 10.09 mg/dL — ABNORMAL HIGH (ref 0.61–1.24)
GFR, Estimated: 5 mL/min — ABNORMAL LOW (ref >60)
Glucose, Bld: 233 mg/dL — ABNORMAL HIGH (ref 70–99)
Potassium: 4.1 mmol/L (ref 3.5–5.1)
Sodium: 142 mmol/L (ref 135–145)

## 2021-04-16 LAB — ECHOCARDIOGRAM COMPLETE
Area-P 1/2: 2.3 cm2
Height: 68 in
MV VTI: 1.49 cm2
S' Lateral: 2.7 cm
Weight: 2557.34 oz

## 2021-04-16 LAB — HEMOGLOBIN A1C
Hgb A1c MFr Bld: 7.6 % — ABNORMAL HIGH (ref 4.8–5.6)
Mean Plasma Glucose: 171.42 mg/dL

## 2021-04-16 IMAGING — MR MR MRA HEAD W/O CM
1 series · 20 of 48 positions shown · non-contrast
Comparison: Brain MRI from yesterday

CLINICAL DATA: Stroke follow-up

EXAM:
MRA HEAD WITHOUT CONTRAST
TECHNIQUE: Angiographic images of the Circle of Willis were acquired using MRA
technique without intravenous contrast.

[Series 5: 3d cow · axial · 0.5mm · 0.41mm/px · z∈[-57,+32]mm · 20 of 188 slices shown]
[im 1/188]
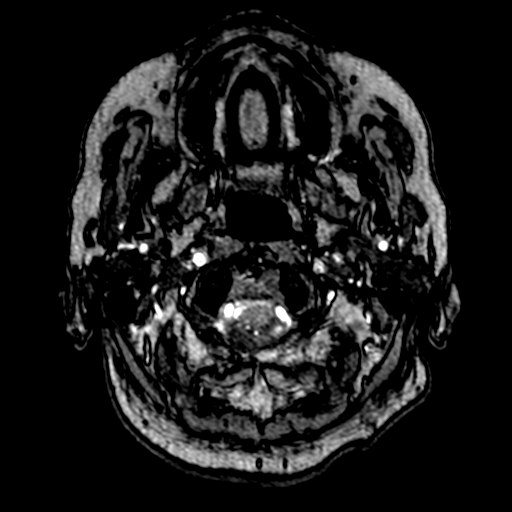
[im 4/188]
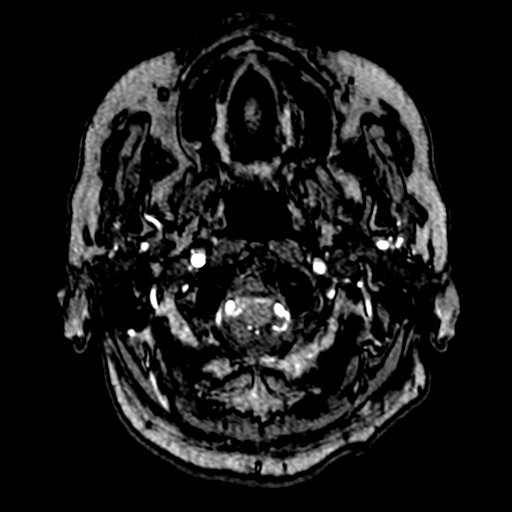
[im 8/188]
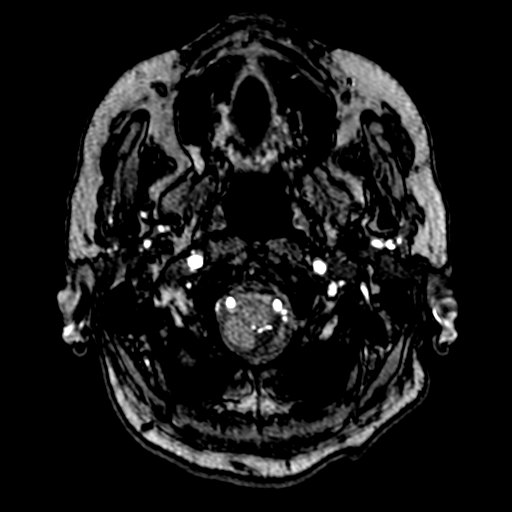
[im 12/188]
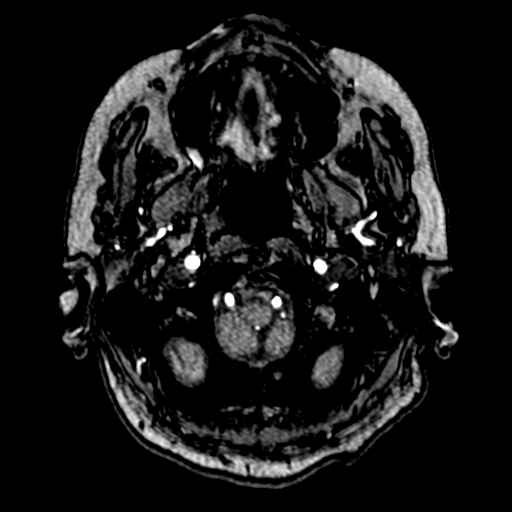
[im 16/188]
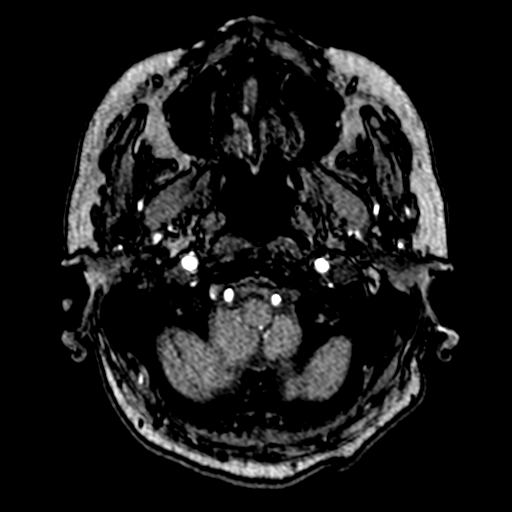
[im 20/188]
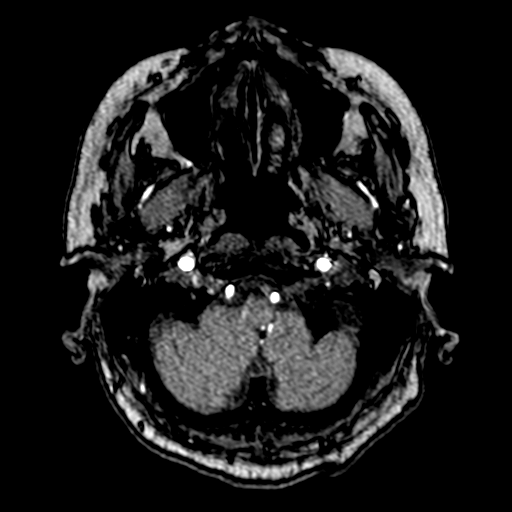
[im 24/188]
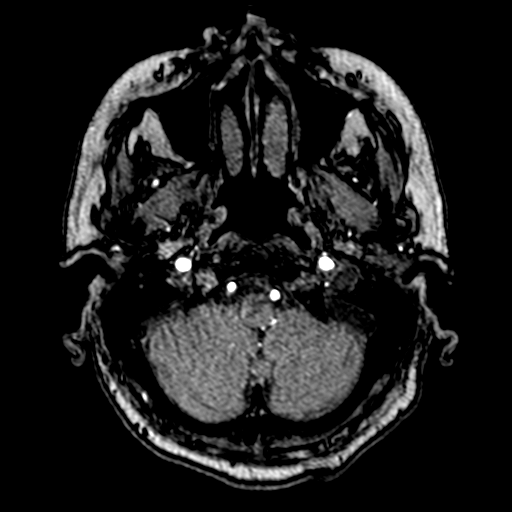
[im 28/188]
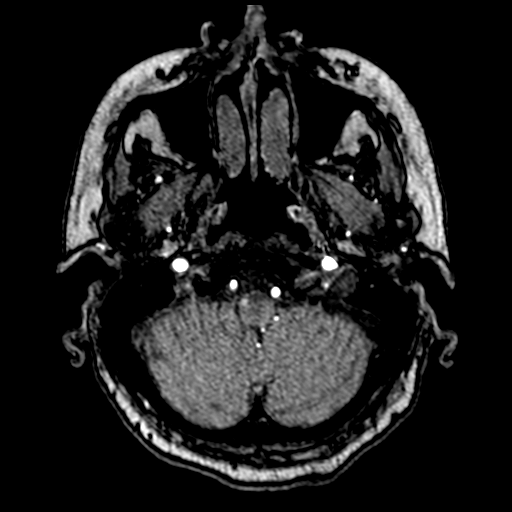
[im 32/188]
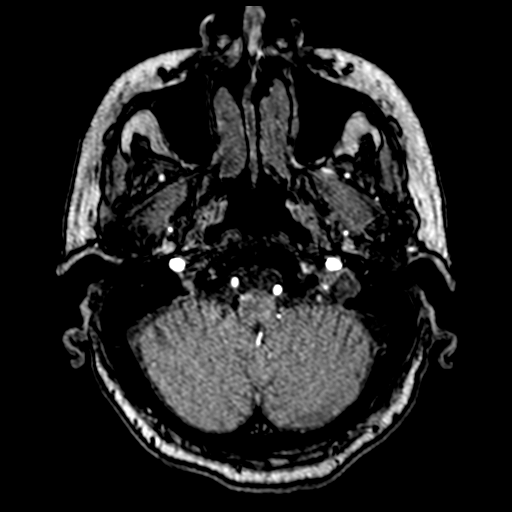
[im 36/188]
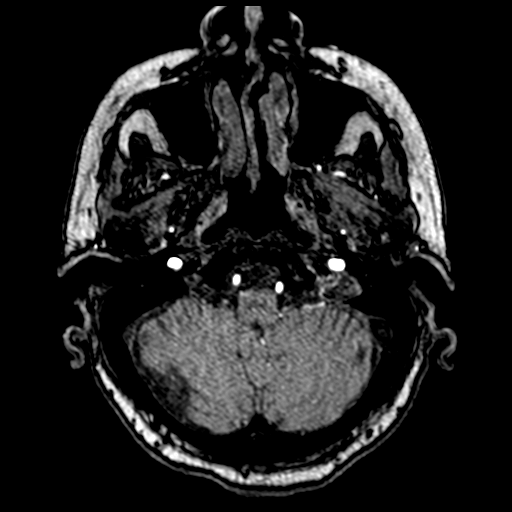
[im 40/188]
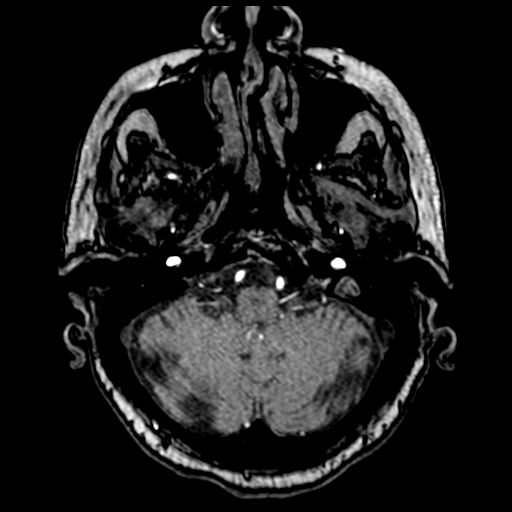
[im 44/188]
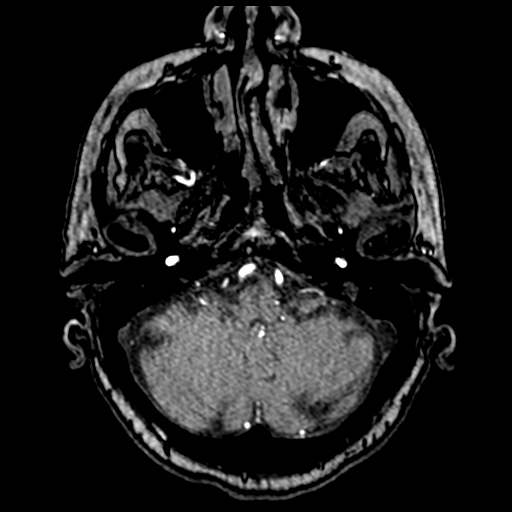
[im 60/188]
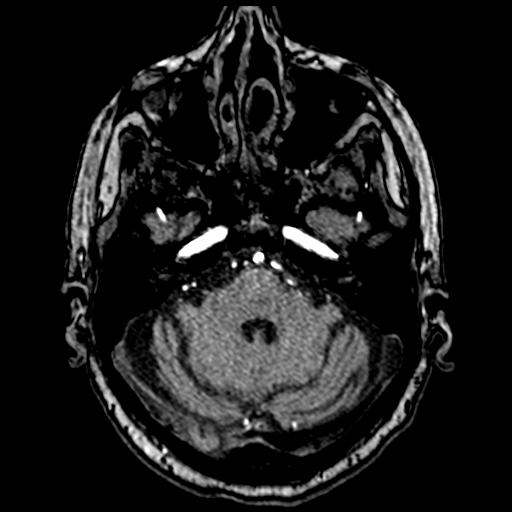
[im 84/188]
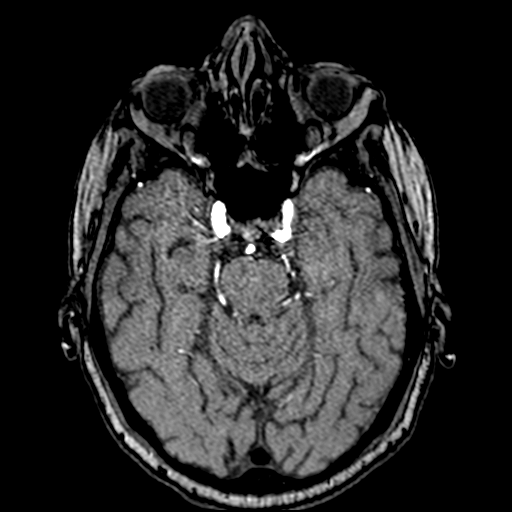
[im 96/188]
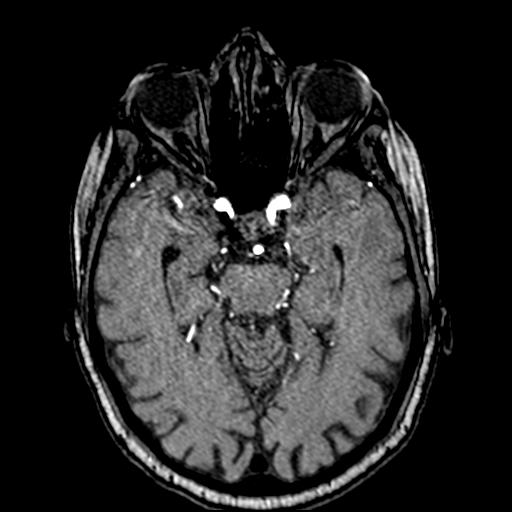
[im 108/188]
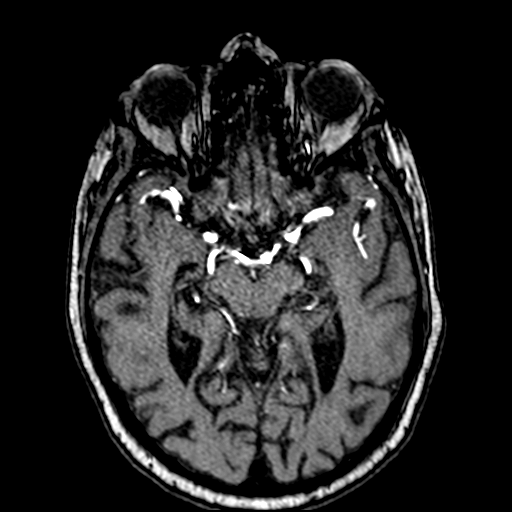
[im 132/188]
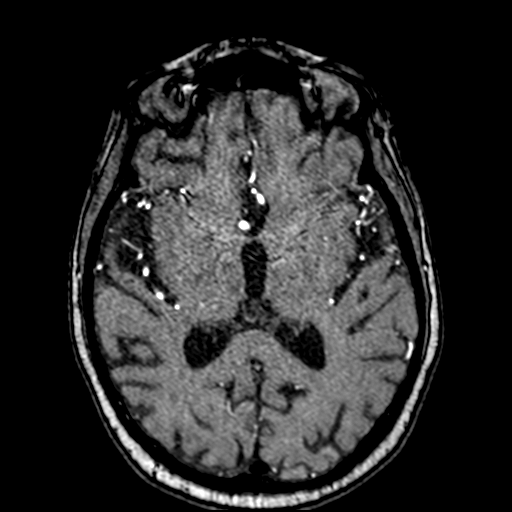
[im 156/188]
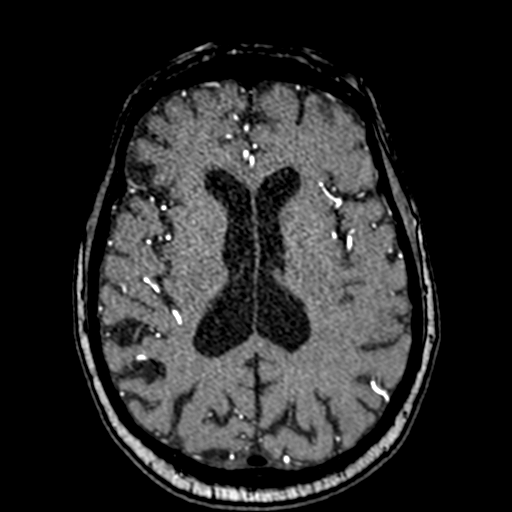
[im 160/188]
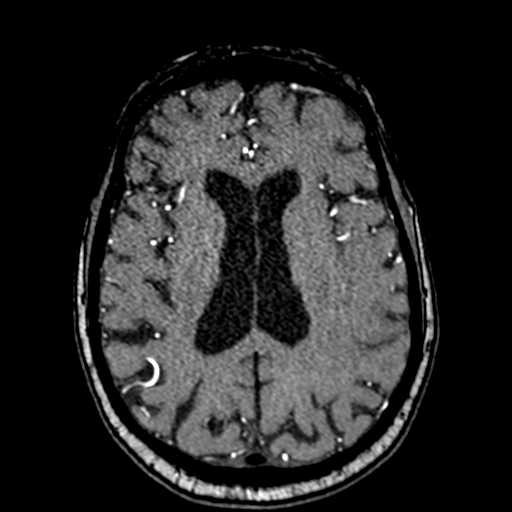
[im 180/188]
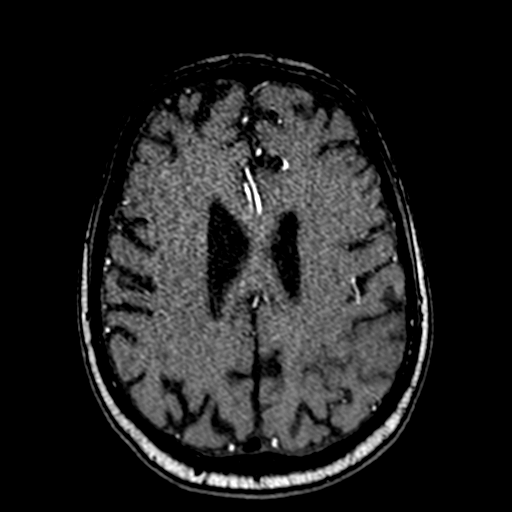

[20 of 48 positions shown; findings below may reference images not displayed]

FINDINGS: Anterior circulation: Diminished flow in a left MCA branch marked on
series 5, correlating with the acute infarct. Apparent luminal
filling defect at the left M2 level which is nonocclusive. No
contralateral or ACA embolism or branch occlusion seen. Mild for age
atheromatous irregularity of the carotid siphons. Negative for
aneurysm

Posterior circulation: Vertebral, basilar, and branching vessels
appear smoothly contoured and diffusely patent. Negative for
aneurysm.

Anatomic variants: None significant

Other: Reformats were generated and reviewed in PACS
IMPRESSION: Diminished flow in a left MCA branch correlating with the known
acute infarct. Suspected nonocclusive filling defect in a more
proximal left M2 branch, likely sequela of embolism.

Elsewhere unremarkable intracranial arteries for age.

## 2021-04-16 IMAGING — MR MR MRA NECK W/O CM
6 of 10 series · 36 of 48 positions shown · non-contrast
Comparison: No pertinent prior exam.

CLINICAL DATA: Stroke follow-up

EXAM:
MRA NECK WITHOUT CONTRAST
TECHNIQUE: Angiographic images of the neck were acquired using MRA technique
without intravenous contrast. Carotid stenosis measurements (when
applicable) are obtained utilizing NASCET criteria, using the distal
internal carotid diameter as the denominator.

[Series 13: tof_fl3d_tra_iso · axial · 0.6mm · 0.52mm/px · z∈[-185,-100]mm · 20 of 146 slices shown]
[im 1/146]
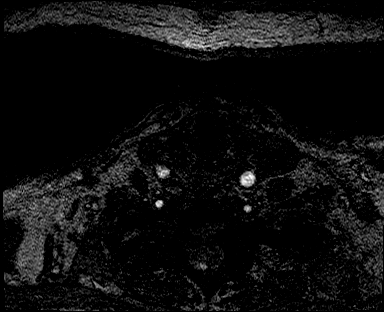
[im 8/146]
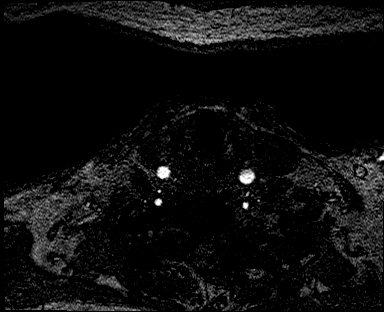
[im 16/146]
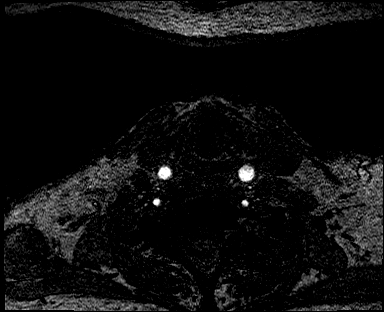
[im 23/146]
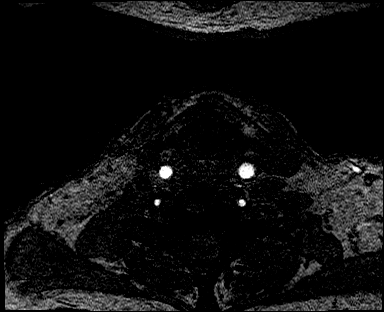
[im 31/146]
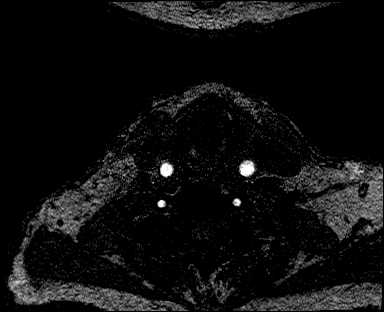
[im 39/146]
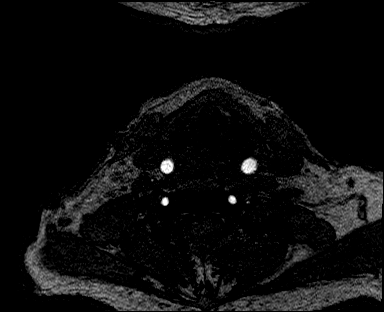
[im 46/146]
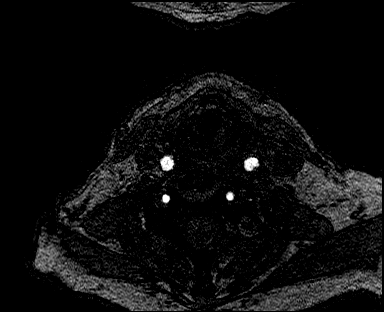
[im 54/146]
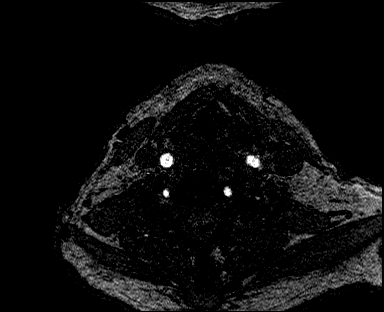
[im 62/146]
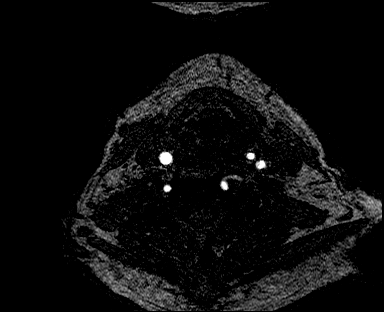
[im 69/146]
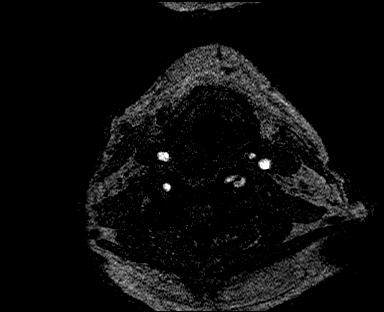
[im 77/146]
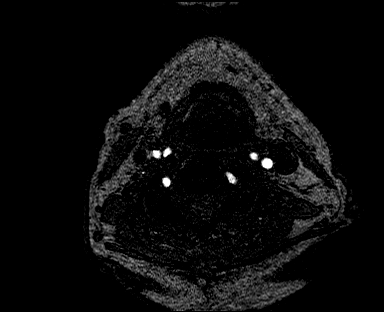
[im 84/146]
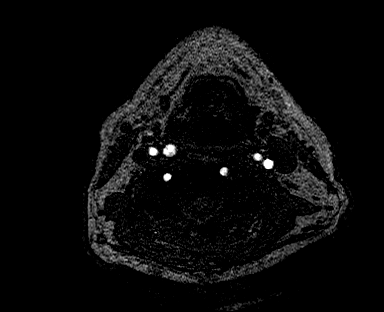
[im 92/146]
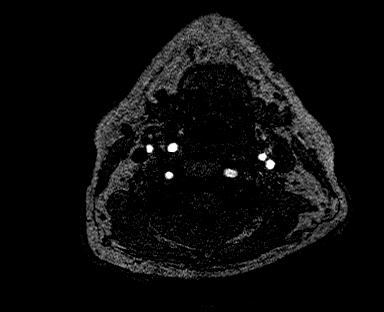
[im 100/146]
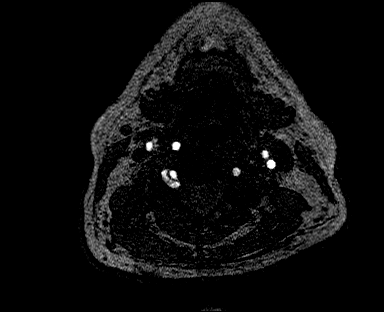
[im 107/146]
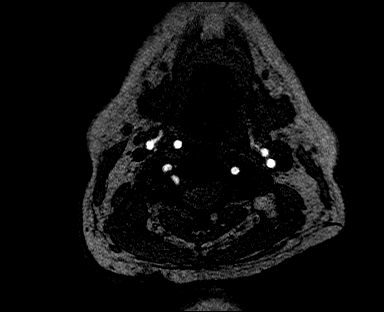
[im 115/146]
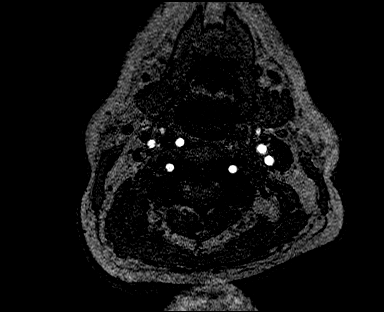
[im 123/146]
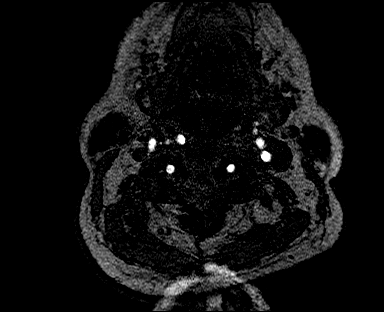
[im 130/146]
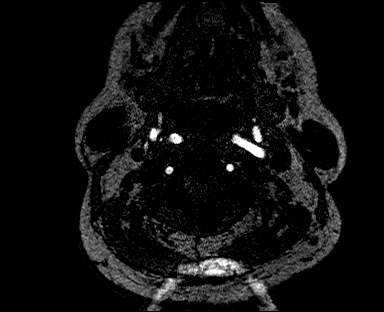
[im 138/146]
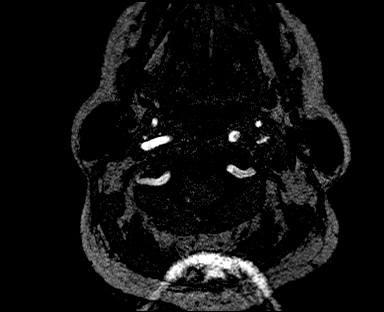
[im 146/146]
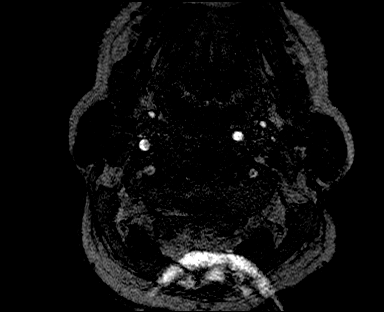

[Series 16: tof_fl2d_tra · axial · 3.5mm · 0.98mm/px · z∈[-279,-7]mm · 12 of 120 slices shown]
[im 1/120]
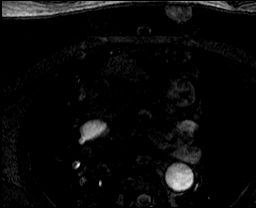
[im 8/120]
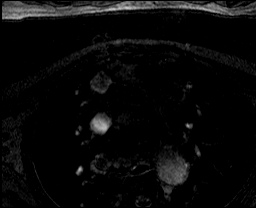
[im 15/120]
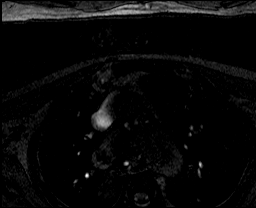
[im 23/120]
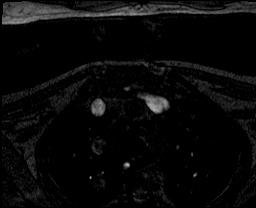
[im 38/120]
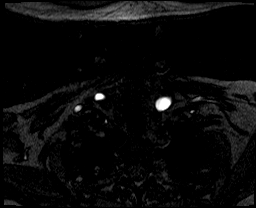
[im 53/120]
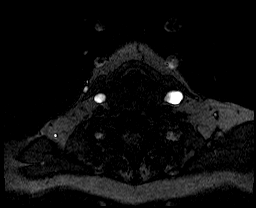
[im 60/120]
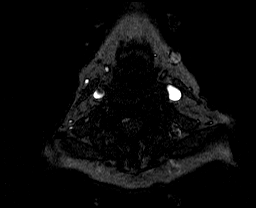
[im 67/120]
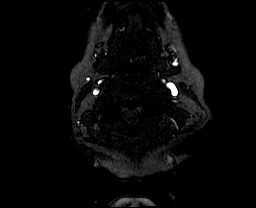
[im 82/120]
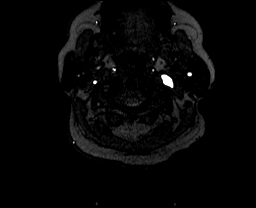
[im 97/120]
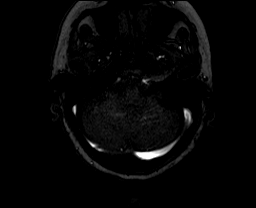
[im 105/120]
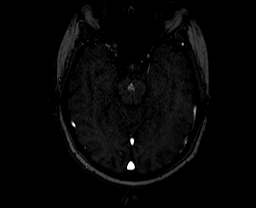
[im 112/120]
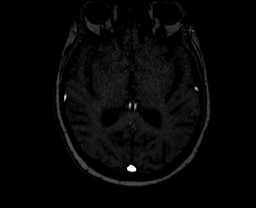

[Series 19: tof_fl2d_tra_mip_tra · axial · 295.0mm · 0.98mm/px · 1 of 1 slices shown]
[im 1/1]
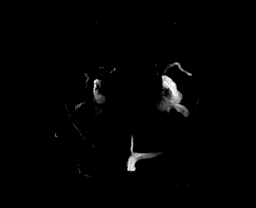

[Series 20: tof_fl2d_tra_mip_radials · oblique · 0.98mm/px · 1 of 4 slices shown]
[im 1/4]
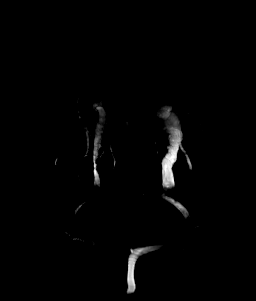

[Series 1021: lt icca tumble · axial · 1.0mm · 0.23mm/px · 1 of 2 slices shown]
[im 1/2]
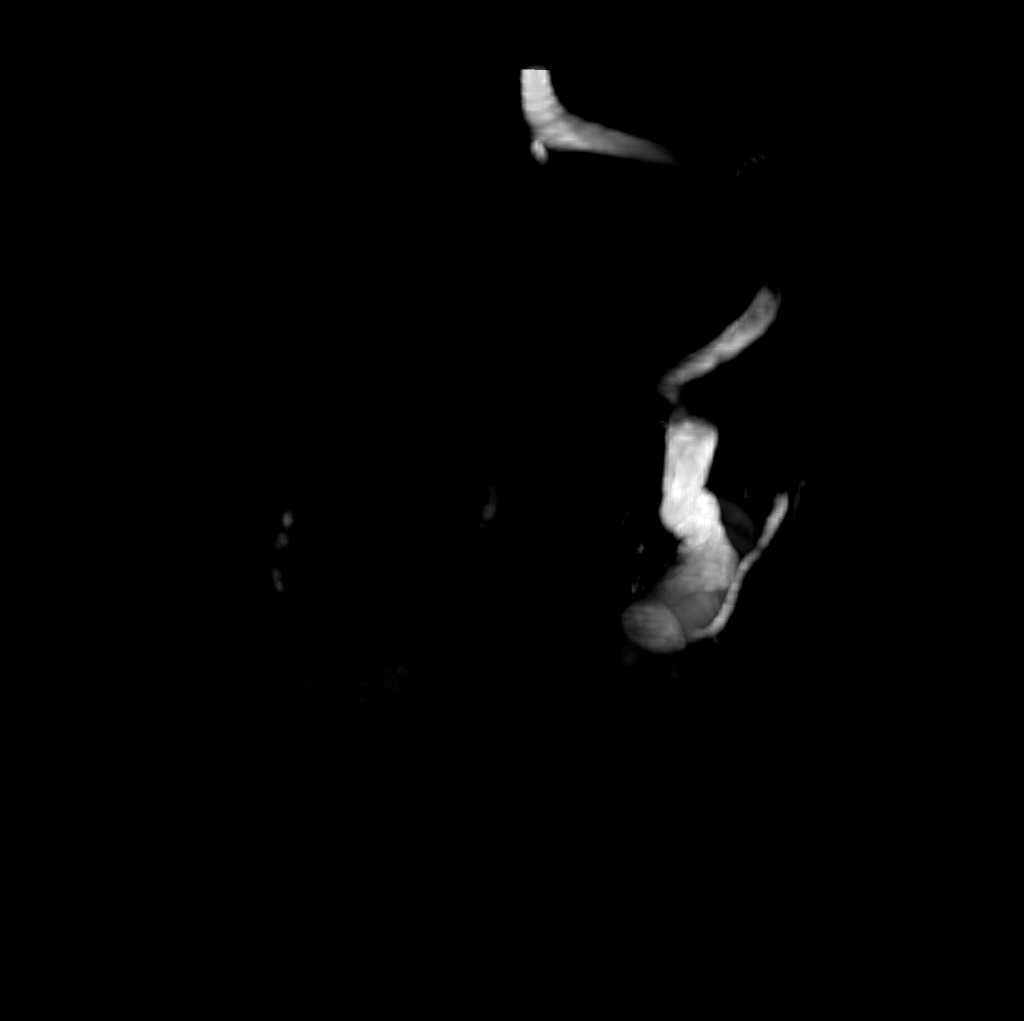

[Series 1033: rt icca tumble · axial · 1.0mm · 0.32mm/px · 1 of 4 slices shown]
[im 1/4]
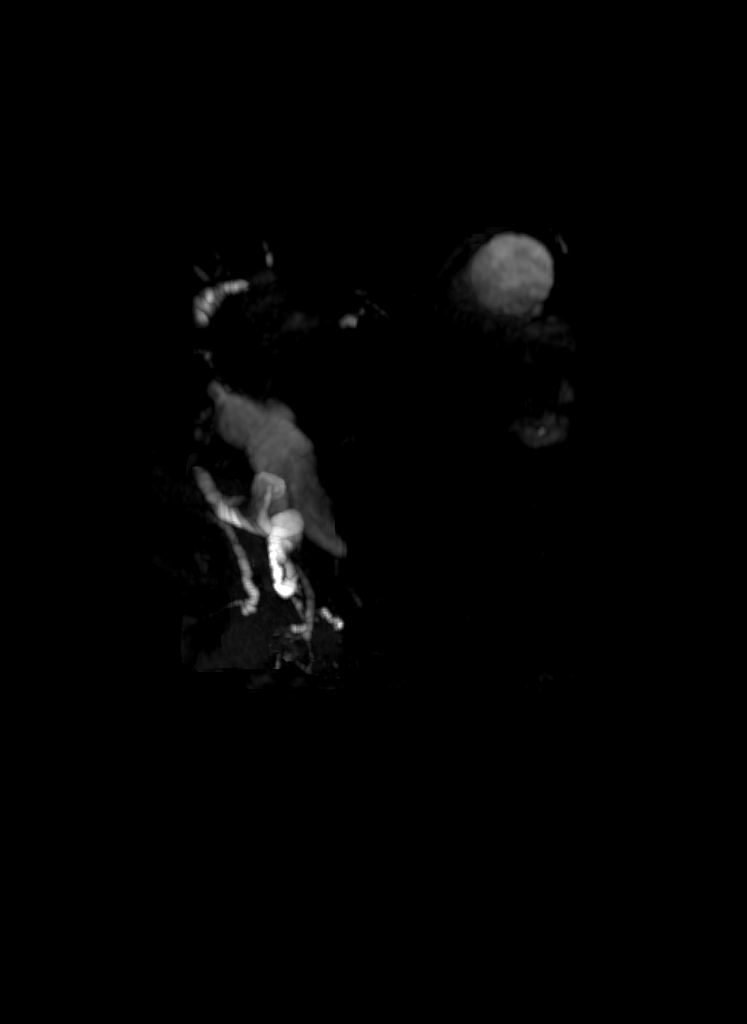

[36 of 48 positions shown; findings below may reference images not displayed]

FINDINGS: Aortic arch: Covered on phase contrast but encoding gradient
highlights veins rather than arteries. The arch and great vessels
are not assessed.

Carotid and vertebral arteries: By time-of-flight there is antegrade
flow in the carotid and vertebral arteries with non flow reducing
stenosis at both proximal ICA from atheromatous plaque. The
vertebral arteries are tortuous without flow limiting stenosis or
beading. The right vertebral artery projects into a right-sided
transverse foramen.
IMPRESSION: No flow limiting stenosis in the covered carotid and vertebral
arteries.

## 2021-04-16 MED ORDER — PENTAFLUOROPROP-TETRAFLUOROETH EX AERO: 1.0000 "application " | INHALATION_SPRAY | CUTANEOUS | Status: DC | PRN

## 2021-04-16 MED ORDER — LIDOCAINE-PRILOCAINE 2.5-2.5 % EX CREA
1.0000 "application " | TOPICAL_CREAM | CUTANEOUS | Status: DC | PRN
  Filled 2021-04-16: qty 5

## 2021-04-16 MED ORDER — SODIUM CHLORIDE 0.9 % IV SOLN: 100.0000 mL | INTRAVENOUS | Status: DC | PRN

## 2021-04-16 MED ORDER — ALTEPLASE 2 MG IJ SOLR
2.0000 mg | Freq: Once | INTRAMUSCULAR | Status: DC | PRN
  Filled 2021-04-16: qty 2

## 2021-04-16 MED ORDER — HEPARIN SODIUM (PORCINE) 1000 UNIT/ML DIALYSIS
1000.0000 [IU] | INTRAMUSCULAR | Status: DC | PRN
  Filled 2021-04-16: qty 1

## 2021-04-16 MED ORDER — LIDOCAINE HCL (PF) 1 % IJ SOLN: 5.0000 mL | INTRAMUSCULAR | Status: DC | PRN

## 2021-04-16 NOTE — Progress Notes (Signed)
Bilateral lower extremity venous duplex completed. ?Refer to "CV Proc" under chart review to view preliminary results. ? ?04/16/2021 3:25 PM ?Kelby Aline., MHA, RVT, RDCS, RDMS   ?

## 2021-04-16 NOTE — Consult Note (Signed)
Renal Service ?Consult Note ?[facility] ? ?Nicholas [name] ?04/16/2021 ?Nicholas [name], Nicholas [name] ?Requesting Physician: Dr. Tawanna Solo ? ?Reason for Consult: ESRD pt w/ acute CVA ?HPI: The patient is a 88 y.o. year-old w/ hx of DM2, ESRD on HD, HL, HTN who presented w/ poor coordination in the R hand x 4 days. Pt on HD TTS. In ED BP's were high. MRI showed L MCA CVA. Admitted for CVA w/u. Asked to see for ESRD.   ? ?Pt seen on HD.  No c/o at this time.  On HD since 2018, TTS schedule. BP's are usually / often low at OP HD per the pt. Hx CAD/ MI. Lives w/ his wife and son in [place]. ? ? ?ROS - denies CP, no joint pain, no HA, no blurry vision, no rash, no diarrhea, no nausea/ vomiting ? ? ?Past Medical History  ?Past Medical History:  ?Diagnosis Date  ? Arthritis   ? Chronic kidney disease   ? Diabetes mellitus ([place])   ? Dialysis patient [facility])   ? GERD (gastroesophageal reflux disease)   ? occ  ? Heart murmur   ? Hypercholesteremia   ? Hypertension   ? Psoriasis   ? ?Past Surgical History  ?Past Surgical History:  ?Procedure Laterality Date  ? CARDIAC CATHETERIZATION  09  ? "some blockage" with collaterals by cath at [facility] ~ 2009; no intervention required; reportedly, no routine cardiology f/u recommended   ? COLONOSCOPY WITH PROPOFOL N/A 07/06/2020  ? Procedure: COLONOSCOPY WITH PROPOFOL;  Surgeon: Ronald Lobo, Nicholas [name];  Location: [place];  Service: Endoscopy;  Laterality: N/A;  ? CORONARY ATHERECTOMY N/A 08/09/2018  ? Procedure: CORONARY ATHERECTOMY;  Surgeon: Jettie Booze, Nicholas [name];  Location: [employer] CV LAB;  Service: Cardiovascular;  Laterality: N/A;  ? CORONARY STENT INTERVENTION N/A 08/09/2018  ? Procedure: CORONARY STENT INTERVENTION;  Surgeon: Jettie Booze, Nicholas [name];  Location: [place] CV LAB;  Service: Cardiovascular;  Laterality: N/A;  ? ESOPHAGOGASTRODUODENOSCOPY (EGD) WITH PROPOFOL N/A 07/06/2020  ? Procedure: ESOPHAGOGASTRODUODENOSCOPY (EGD) WITH PROPOFOL;  Surgeon: Ronald Lobo,  Nicholas [name];  Location: [place];  Service: Endoscopy;  Laterality: N/A;  ? INGUINAL HERNIA REPAIR Right 04/13/2012  ? Procedure: HERNIA REPAIR INGUINAL INCARCERATED;  Surgeon: Gayland Curry, Nicholas [name];  Location: [place];  Service: General;  Laterality: Right;  ? INSERTION OF MESH Right 04/13/2012  ? Procedure: INSERTION OF MESH;  Surgeon: Gayland Curry, Nicholas [name];  Location: [place];  Service: General;  Laterality: Right;  ? IR THORACENTESIS ASP PLEURAL SPACE W/IMG GUIDE  06/08/2020  ? LEFT HEART CATH N/A 08/09/2018  ? Procedure: Left Heart Cath;  Surgeon: Jettie Booze, Nicholas [name];  Location: [place] CV LAB;  Service: Cardiovascular;  Laterality: N/A;  ? LEFT HEART CATH AND CORONARY ANGIOGRAPHY N/A 08/08/2018  ? Procedure: LEFT HEART CATH AND CORONARY ANGIOGRAPHY;  Surgeon: Troy Sine, Nicholas [name];  Location: [place] CV LAB;  Service: Cardiovascular;  Laterality: N/A;  ? LEFT HEART CATH AND CORONARY ANGIOGRAPHY N/A 06/05/2020  ? Procedure: LEFT HEART CATH AND CORONARY ANGIOGRAPHY;  Surgeon: Belva Crome, Nicholas [name];  Location: [place] CV LAB;  Service: Cardiovascular;  Laterality: N/A;  ? VASECTOMY    ? ?Family History  ?Family History  ?Problem Relation Age of Onset  ? Diabetes Mother   ? Stroke Father   ? Diabetes Sister   ? Diabetes Brother   ? ?Social History  reports that he quit smoking about 34 years ago. His smoking use included cigarettes. He has  a 15.00 pack-year smoking history. He has never used smokeless tobacco. He reports that he does not drink alcohol and does not use drugs. ?Allergies  ?Allergies  ?Allergen Reactions  ? Hydrochlorothiazide Other (See Comments)  ?  Unknown per [place] records  ? ?Home medications ?Prior to Admission medications   ?Medication Sig Start Date End Date Taking? Authorizing Provider  ?acetaminophen (TYLENOL) 500 MG tablet Take 500 mg by mouth 3 (three) times daily as needed for moderate pain.   Yes Provider, Historical, Nicholas [name]  ?atorvastatin (LIPITOR) 80 MG tablet Take 80 mg by mouth at bedtime.   Yes  Provider, Historical, Nicholas [name]  ?clopidogrel (PLAVIX) 75 MG tablet Take 1 tablet (75 mg total) by mouth daily with breakfast. Restart after 5 days 09/18/20  Yes Domenic Polite, Nicholas [name]  ?diphenhydrAMINE (BENADRYL) 25 mg capsule Take 1 capsule by mouth as needed. 04/06/21  Yes Provider, Historical, Nicholas [name]  ?Fluticasone Propionate,sensor, (ARMONAIR DIGIHALER) 232 MCG/ACT AEPB Inhale 1 puff into the lungs 2 (two) times daily.   Yes Provider, Historical, Nicholas [name]  ?insulin NPH-regular Human (70-30) 100 UNIT/ML injection Inject 10 Units into the skin daily with breakfast. Takes 20 units in the morning if blood sugar is over 140 09/13/20  Yes Domenic Polite, Nicholas [name]  ?isosorbide dinitrate (ISORDIL) 20 MG tablet Take 20 mg by mouth 2 (two) times daily. 09/18/20  Yes Provider, Historical, Nicholas [name]  ?nitroGLYCERIN (NITROSTAT) 0.4 MG SL tablet Place 1 tablet (0.4 mg total) under the tongue every 5 (five) minutes x 3 doses as needed for chest pain. 08/10/18  Yes [name], Nicholas [name]  ?cinacalcet (SENSIPAR) 30 MG tablet Take by mouth 3 (three) times a week. Harrell Lark, Sat 09/18/20   Provider, Historical, Nicholas [name]  ? ? ? ?Vitals:  ? 04/16/21 1030 04/16/21 1055 04/16/21 1127 04/16/21 1156  ?BP: (!) 92/54 (!) 104/54 (!) 111/56 (!) 111/59  ?Pulse:   91 92  ?Resp: (!) '21 20 15 18  '$ ?Temp:  98.7 ?F (37.1 ?C) 99.3 ?F (37.4 ?C) 98.2 ?F (36.8 ?C)  ?TempSrc:  Oral Oral Oral  ?SpO2:  100% 100% 100%  ?Weight:  72.7 kg    ?Height:      ? ?Exam ?Gen alert, no distress ?No rash, cyanosis or gangrene ?Sclera anicteric, throat clear  ?No jvd or bruits ?Chest clear bilat to bases, no rales/ wheezing ?RRR no MRG ?Abd soft ntnd no mass or ascites +bs ?GU normal ?MS no joint effusions or deformity ?Ext no LE or UE edema, no wounds or ulcers ?Neuro is alert, Ox 3 , nf ?    ? ? ? ? ? Home meds include - lipitor, plavix, 70-30 insulin, isordil, sl ntg, sensipar 30 tiw, prns/ vits/ supps ? ? ? ? OP HD: TTS [facility] ? AVF ? ? ?Assessment/ Plan: ?Acute CVA - w/ R sided symptoms,  +acute CVA by MRI.  Per pmd. ?ESRD - on HD TTS. Missed HD yest, getting HD this am. Plan HD tomorrow 3h to get back on schedule.  ?BP/ volume - BP's soft, 0.5 kg UF w/ HD today. Lower threshold for pulling fluid. No gross vol ^ on exam. Add midodrine 5 mg pre HD TTS. ?Anemia esrd - Hb 8-9 range here. Get tsat. Get records.  ?MBD ckd - CCa in range, will add on phos, cont sensipar.  ?DM - on insulin ?  ? ? ? ?Kelly Splinter  Nicholas [name] ?04/16/2021, 3:27 PM ?Recent Labs  ?Lab 04/13/21 ?2033 04/15/21 ?1040 04/16/21 ?0210  ?HGB 9.0* 8.8* 8.1*  ?ALBUMIN  3.2*  --   --   ?CALCIUM 8.7* 8.7* 8.5*  ?CREATININE 4.85* 8.71* 10.09*  ?K 3.5 3.7 4.1  ? ? ?

## 2021-04-16 NOTE — Consult Note (Signed)
? ?  [facility] CM Inpatient Consult ? ? ?04/16/2021 ? ?Nicholas [name] ?[birth year] ?[id number] ? ?[facility] [facility] [ACO] Patient: Medicare ACO [place] ? ? ?Referral request: We have reviewed your referral request and followed up with patient. ? ?Primary Care Provider:  Windy Fast, MD, Is listed with the [facility] in [place].  Patient endorses provider as PCP. ? ?Patient screened for referral request for  hospitalization with noted high risk score for unplanned readmission risk and to assess for potential [facility] service needs for post hospital transition.  Met with patient, wife and son at the bedside to review post hospital follow up needs.  Patient endorses Dr. Sherral Hammers as his primary care provider and Dr. Daneen Schick, as his cardiologist and Dr. Caryl Comes for his "monitoring" doctor.  Patient states, "I have me primary care needs met at the [facility] and don;t feel like I need another primary care."   ? ? ? ?Plan:  Continue to follow progress and disposition to assess for post hospital care management needs.   ? ?For questions contact:  ? ?Natividad Brood, RN BSN CCM ?[facility] Liaison ? [phone] business mobile phone ?Toll free office [phone]  ?Fax number: [phone] ?[email or web address]  ?  ? ?

## 2021-04-16 NOTE — Evaluation (Signed)
Occupational Therapy Evaluation Patient Details Name: Nicholas [name] MRN: [id number] DOB: 1930/12/17 Today's Date: 04/16/2021   History of Present Illness 88 y.o. M admitted on 04/15/21 due to R sided weakness, confusion, and slurred speech. MRI shows Early subacute infarct of L MCA and Additional punctate infarcts in the left caudate body and superior frontal gyrus. PMH significant for HTN, GERD, Arthritis, DM2, ESRD on dialysis. (Simultaneous filing. User may not have seen previous data.)   Clinical Impression   Pt admitted for concerns listed above. PTA pt reported that he was independent with BADL's and functional mobility using a RW. At this time, pt presents with weakness, balance deficits, decreased activity tolerance, and cognitive deficits. He is requiring min to mod A for ADL's and mod A for functional mobility/transfers, using a RW. Recommending [facility] therapy services and acute OT will continue to follow.        Recommendations for follow up therapy are one component of a multi-disciplinary discharge planning process, led by the attending physician.  Recommendations may be updated based on patient status, additional functional criteria and insurance authorization.   Follow Up Recommendations  Home health OT    Assistance Recommended at Discharge Frequent or constant Supervision/Assistance  Patient can return home with the following A little help with walking and/or transfers;A lot of help with bathing/dressing/bathroom;Direct supervision/assist for medications management;Direct supervision/assist for financial management;Assistance with cooking/housework    Functional Status Assessment  Patient has had a recent decline in their functional status and demonstrates the ability to make significant improvements in function in a reasonable and predictable amount of time.  Equipment Recommendations  None recommended by OT    Recommendations for Other Services       Precautions /  Restrictions Precautions Precautions: Fall Restrictions Weight Bearing Restrictions: No      Mobility Bed Mobility Overal bed mobility: Needs Assistance Bed Mobility: Supine to Sit     Supine to sit: Min guard     General bed mobility comments: Increased time, use of railings, min guard for safety    Transfers Overall transfer level: Needs assistance Equipment used: Rolling walker (2 wheels) Transfers: Sit to/from Stand Sit to Stand: Mod assist           General transfer comment: Mod A to power up and steady, pulling up on his sons hand      Balance Overall balance assessment: Needs assistance Sitting-balance support: No upper extremity supported, Feet supported Sitting balance-Leahy Scale: Good     Standing balance support: Bilateral upper extremity supported, Reliant on assistive device for balance Standing balance-Leahy Scale: Poor Standing balance comment: reliant on RW and/or external support                           ADL either performed or assessed with clinical judgement   ADL Overall ADL's : Needs assistance/impaired Eating/Feeding: Set up;Sitting   Grooming: Set up;Sitting   Upper Body Bathing: Minimal assistance;Sitting   Lower Body Bathing: Moderate assistance;Maximal assistance;Sitting/lateral leans;Sit to/from stand   Upper Body Dressing : Minimal assistance;Sitting   Lower Body Dressing: Moderate assistance;Maximal assistance;Sitting/lateral leans;Sit to/from stand   Toilet Transfer: Minimal assistance;Ambulation   Toileting- Clothing Manipulation and Hygiene: Moderate assistance;Sitting/lateral lean;Sit to/from stand       Functional mobility during ADLs: Minimal assistance;Moderate assistance;Rolling walker (2 wheels) General ADL Comments: Pt requiring increased assist due to weakness and balance deficits.     Vision Baseline Vision/History: 1 Wears glasses Ability to  See in Adequate Light: 0 Adequate Patient Visual  Report: No change from baseline Vision Assessment?: No apparent visual deficits     Perception     Praxis      Pertinent Vitals/Pain Pain Assessment Pain Assessment: No/denies pain     Hand Dominance Right   Extremity/Trunk Assessment Upper Extremity Assessment Upper Extremity Assessment: Generalized weakness   Lower Extremity Assessment Lower Extremity Assessment: Defer to PT evaluation   Cervical / Trunk Assessment Cervical / Trunk Assessment: Kyphotic   Communication Communication Communication: No difficulties   Cognition Arousal/Alertness: Awake/alert Behavior During Therapy: WFL for tasks assessed/performed Overall Cognitive Status: Impaired/Different from baseline                                 General Comments: Slow to respond, family answering for him, unsure of baseline     General Comments  VSS on RA    Exercises     Shoulder Instructions      Home Living Family/patient expects to be discharged to:: Private residence (Simultaneous filing. User may not have seen previous data.) Living Arrangements: Spouse/significant other (Simultaneous filing. User may not have seen previous data.) Available Help at Discharge: Family;Available 24 hours/day (Simultaneous filing. User may not have seen previous data.) Type of Home: House (Simultaneous filing. User may not have seen previous data.) Home Access: Stairs to enter (Simultaneous filing. User may not have seen previous data.) Entrance Stairs-Number of Steps: 1 (Simultaneous filing. User may not have seen previous data.) Entrance Stairs-Rails: None (Simultaneous filing. User may not have seen previous data.) Home Layout: Two level;Bed/bath upstairs (Simultaneous filing. User may not have seen previous data.) Alternate Level Stairs-Number of Steps: Uses stair lift (Simultaneous filing. User may not have seen previous data.) Alternate Level Stairs-Rails: Can reach both Bathroom Shower/Tub: Walk-in  shower (Simultaneous filing. User may not have seen previous data.)   Bathroom Toilet: Handicapped height (Simultaneous filing. User may not have seen previous data.) Bathroom Accessibility: Yes How Accessible: Accessible via walker Home Equipment: Rollator (4 wheels);Cane - single point;Shower seat - built in;Wheelchair - manual (Simultaneous filing. User may not have seen previous data.)   Additional Comments: reports 2L O2 at home      Prior Functioning/Environment Prior Level of Function : Independent/Modified Independent             Mobility Comments: Ambulated with RW ADLs Comments: Indep most days, says sometimes he would need assist        OT Problem List: Decreased strength;Decreased range of motion;Decreased activity tolerance;Impaired balance (sitting and/or standing);Decreased coordination;Decreased cognition;Decreased safety awareness;Decreased knowledge of use of DME or AE      OT Treatment/Interventions: Self-care/ADL training;Therapeutic exercise;Neuromuscular education;Energy conservation;DME and/or AE instruction;Therapeutic activities;Cognitive remediation/compensation;Patient/family education;Balance training    OT Goals(Current goals can be found in the care plan section) Acute Rehab OT Goals Patient Stated Goal: To get stronger OT Goal Formulation: With patient Time For Goal Achievement: 04/30/21 Potential to Achieve Goals: Good ADL Goals Pt Will Perform Lower Body Bathing: with min guard assist;sitting/lateral leans;sit to/from stand Pt Will Perform Lower Body Dressing: with min guard assist;sitting/lateral leans;sit to/from stand Pt Will Transfer to Toilet: with min guard assist;ambulating Pt Will Perform Toileting - Clothing Manipulation and hygiene: with min guard assist;sitting/lateral leans;sit to/from stand Additional ADL Goal #1: Pt will demonstrate increased activity tolerance to perform standing ADL tasks with supervision for 5 mins.  OT  Frequency: Min 2X/week  Co-evaluation              AM-PAC OT "6 Clicks" Daily Activity     Outcome Measure Help from another person eating meals?: A Little Help from another person taking care of personal grooming?: A Little Help from another person toileting, which includes using toliet, bedpan, or urinal?: A Lot Help from another person bathing (including washing, rinsing, drying)?: A Lot Help from another person to put on and taking off regular upper body clothing?: A Little Help from another person to put on and taking off regular lower body clothing?: A Lot 6 Click Score: 15   End of Session Equipment Utilized During Treatment: Gait belt;Rolling walker (2 wheels) Nurse Communication: Mobility status  Activity Tolerance: Patient tolerated treatment well Patient left: in bed;with call bell/phone within reach;with family/visitor present (sitting EOB eating lunch with family)  OT Visit Diagnosis: Unsteadiness on feet (R26.81);Other abnormalities of gait and mobility (R26.89);Muscle weakness (generalized) (M62.81)                Time: 0981-1914 OT Time Calculation (min): 17 min Charges:  OT General Charges $OT Visit: 1 Visit OT Evaluation $OT Eval Moderate Complexity: 1 Mod  Randa Riss H., OTR/L Acute Rehabilitation  Selita Staiger Elane Bing Plume 04/16/2021, 2:31 PM

## 2021-04-16 NOTE — Progress Notes (Signed)
PT Cancellation Note ? ?Patient Details ?Name: Nicholas [name] ?MRN: [id number] ?DOB: 28-Oct-1930 ? ? ?Cancelled Treatment:    Reason Eval/Treat Not Completed: Patient at procedure or test/unavailable. Pt remains out of room for testing. Will try again tomorrow. ? ? ?Shary Decamp [facility] ?04/16/2021, 3:40 PM ?Suanne Marker PT ?Acute Rehabilitation Services ?Pager [phone] ?Office [phone] ? ?

## 2021-04-16 NOTE — Progress Notes (Signed)
?  Echocardiogram ?2D Echocardiogram has been performed. ? ?Nicholas [name] ?04/16/2021, 4:10 PM ?

## 2021-04-16 NOTE — Procedures (Signed)
? ?  I was present at this dialysis session, have reviewed the session itself and made  appropriate changes ?Kelly Splinter MD ?[employer] ?pager [phone]   ?04/16/2021, 4:02 PM ? ? ?

## 2021-04-16 NOTE — Progress Notes (Addendum)
STROKE TEAM PROGRESS NOTE  ? ?ATTENDING NOTE: ?I reviewed above note and agree with the assessment and plan. Pt was seen and examined.  ? ?88 year old male with history of hypertension, hyperlipidemia, diabetes, ESRD on hemodialysis, CAD presented to ED for confusion, speech delay, AMS and lethargy on 04/13/2021.  Found to have hypoglycemia, treated with glucose and feeling better, CT negative, discharged from ED.  However, patient readmitted due to recurrent symptoms.  MRI showed left parietal MCA territory infarct, MRA head and neck showed left M2 stenosis correlating to the infarct.  2D echo pending.  LDL 116, A1c 7.6, DVT negative.  Loop recorder interrogation showed no A-fib.  Of note, patient BP was low during dialysis. ? ?On exam, patient son and wife at bedside.  Patient awake alert, however not orientated to time or place, however no aphasia, able to name repeat, no focal deficit.  Etiology for patient stroke concerning for intracranial stenosis with left M2 stenosis in the setting of low BP.  Recommend close BP monitoring at home and during dialysis, and avoid low BP.  Discussed with Dr. Tawanna Solo, no midodrine needed due to intermittent hypertension.  Continue aspirin 81 and Plavix 75 DAPT for 3 months and then Plavix alone given intracranial stenosis.  Continue Lipitor 80.  Aggressive risk factor modification including better DM control.  PT started recommend home health. ? ?For detailed assessment and plan, please refer to above as I have made changes wherever appropriate.  ? ?Neurology will sign off. Please call with questions. Pt will follow up with stroke clinic NP at [facility] in about 4 weeks. Thanks for the consult. ? ? ?Rosalin Hawking, MD PhD ?Stroke Neurology ?04/16/2021 ?5:06 PM ? ? ? ?INTERVAL HISTORY ?His son and wife at the bedside.  Patient was awake, alert, in no distress.  Patient still exhibited some language and comprehension deficit.  Patient had difficulty following commands during visual field  testing, finger-to-nose cerebellar, and distal strength testing.  Patient difficulties following simple and midline crossing commands.  Patient is able to repeat, use phrases, but required more effort on " happy hippopotamus/no ands, ifs or buts about it".  He had no difficulty naming.  Otherwise patient had no focal weakness or sensory deficit.  Per family at bedside, patient still has some speech delay, but denied dysphagia. ? ?Patient had no further concerns. ? ?Vitals:  ? 04/16/21 1000 04/16/21 1030 04/16/21 1127 04/16/21 1156  ?BP: (!) 86/47 (!) 92/54 (!) 111/56 (!) 111/59  ?Pulse:   91 92  ?Resp: (!) 22 (!) '21 15 18  '$ ?Temp:   99.3 ?F (37.4 ?C) 98.2 ?F (36.8 ?C)  ?TempSrc:   Oral Oral  ?SpO2:   100% 100%  ?Weight:      ?Height:      ? ?CBC:  ?Recent Labs  ?Lab 04/13/21 ?2033 04/15/21 ?1040 04/16/21 ?0210  ?WBC 8.6 8.9 6.6  ?NEUTROABS 6.5  --  4.8  ?HGB 9.0* 8.8* 8.1*  ?HCT 27.2* 27.8* 24.0*  ?MCV 100.0 103.0* 99.6  ?PLT 287 293 272  ? ?Basic Metabolic Panel:  ?Recent Labs  ?Lab 04/15/21 ?1040 04/16/21 ?0210  ?NA 139 142  ?K 3.7 4.1  ?CL 96* 99  ?CO2 27 30  ?GLUCOSE 160* 233*  ?BUN 38* 45*  ?CREATININE 8.71* 10.09*  ?CALCIUM 8.7* 8.5*  ? ?Lipid Panel:  ?Recent Labs  ?Lab 04/16/21 ?0210  ?CHOL 198  ?TRIG 87  ?HDL 65  ?CHOLHDL 3.0  ?VLDL 17  ?LDLCALC 116*  ? ?HgbA1c:  ?Recent  Labs  ?Lab 04/16/21 ?0210  ?HGBA1C 7.6*  ? ?Urine Drug Screen:  ?No results for input(s): LABOPIA, COCAINSCRNUR, LABBENZ, AMPHETMU, THCU, LABBARB in the last 168 hours. ?Alcohol Level No results for input(s): ETH in the last 168 hours. ? ?IMAGING past 24 hours ?MR ANGIO HEAD WO CONTRAST ? ?Result Date: 04/16/2021 ?CLINICAL DATA:  Stroke follow-up EXAM: MRA HEAD WITHOUT CONTRAST TECHNIQUE: Angiographic images of the Circle of Willis were acquired using MRA technique without intravenous contrast. COMPARISON:  Brain MRI from yesterday FINDINGS: Anterior circulation: Diminished flow in a left MCA branch marked on series 5, correlating with the  acute infarct. Apparent luminal filling defect at the left M2 level which is nonocclusive. No contralateral or ACA embolism or branch occlusion seen. Mild for age atheromatous irregularity of the carotid siphons. Negative for aneurysm Posterior circulation: Vertebral, basilar, and branching vessels appear smoothly contoured and diffusely patent. Negative for aneurysm. Anatomic variants: None significant Other: Reformats were generated and reviewed in PACS IMPRESSION: Diminished flow in a left MCA branch correlating with the known acute infarct. Suspected nonocclusive filling defect in a more proximal left M2 branch, likely sequela of embolism. Elsewhere unremarkable intracranial arteries for age. Electronically Signed   By: Jorje Guild M.D.   On: 04/16/2021 05:17  ? ?MR ANGIO NECK WO CONTRAST ? ?Result Date: 04/16/2021 ?CLINICAL DATA:  Stroke follow-up EXAM: MRA NECK WITHOUT CONTRAST TECHNIQUE: Angiographic images of the neck were acquired using MRA technique without intravenous contrast. Carotid stenosis measurements (when applicable) are obtained utilizing NASCET criteria, using the distal internal carotid diameter as the denominator. COMPARISON:  No pertinent prior exam. FINDINGS: Aortic arch: Covered on phase contrast but encoding gradient highlights veins rather than arteries. The arch and great vessels are not assessed. Carotid and vertebral arteries: By time-of-flight there is antegrade flow in the carotid and vertebral arteries with non flow reducing stenosis at both proximal ICA from atheromatous plaque. The vertebral arteries are tortuous without flow limiting stenosis or beading. The right vertebral artery projects into a right-sided transverse foramen. IMPRESSION: No flow limiting stenosis in the covered carotid and vertebral arteries. Electronically Signed   By: Jorje Guild M.D.   On: 04/16/2021 05:20  ? ?MR Brain Wo Contrast (neuro protocol) ? ?Result Date: 04/15/2021 ?CLINICAL DATA:  Neuro  deficit, stroke suspected EXAM: MRI HEAD WITHOUT CONTRAST TECHNIQUE: Multiplanar, multiecho pulse sequences of the brain and surrounding structures were obtained without intravenous contrast. COMPARISON:  CT head 04/13/2021 FINDINGS: Brain: There is a moderate-sized area of diffusion restriction in the left parietal lobe in the MCA distribution with associated T2/FLAIR signal abnormality, likely reflecting early subacute infarct. Additional punctate infarcts are seen in the left caudate body and superior frontal gyrus. There are curvilinear foci of susceptibility artifact within the infarct territory suggesting petechial hemorrhage. There is mild sulcal effacement without significant mass effect. There is a background of mild-to-moderate global parenchymal volume loss with prominence of the ventricular system and extra-axial CSF spaces. Patchy foci of FLAIR signal abnormality in the remainder of the subcortical and periventricular white matter likely reflects sequela of mild chronic white matter microangiopathy. There is no solid mass lesion.  There is no midline shift. Vascular: Major intracranial flow voids are present. There is SWI signal dropout in the left sylvian fissure corresponding to calcifications seen on the recent CT, suspicious for a calcified embolus causing the above-described infarct. Skull and upper cervical spine: Normal marrow signal. Sinuses/Orbits: The paranasal sinuses are clear. Bilateral lens implants are in place. The globes  and orbits are otherwise unremarkable. Other: None. IMPRESSION: 1. Moderate-sized early subacute infarct in the left MCA distribution, suspected due to a calcified embolus within the sylvian fissure. Curvilinear SWI signal dropout within the infarct territory is consistent with petechial hemorrhage. There is mild edema with sulcal effacement but no midline shift. 2. Additional punctate infarcts in the left caudate body and superior frontal gyrus. Electronically Signed    By: Valetta Mole M.D.   On: 04/15/2021 16:05   ? ?PHYSICAL EXAM ?Constitutional: Appears well-developed and well-nourished. In no apparent distress. ?Psych: Affect appropriate to situation ?Eyes: No scleral in

## 2021-04-16 NOTE — Progress Notes (Signed)
PT Cancellation Note ? ?Patient Details ?Name: Nicholas [name] ?MRN: [id number] ?DOB: 11/30/1930 ? ? ?Cancelled Treatment:    Reason Eval/Treat Not Completed: Patient at procedure or test/unavailable. Pt taken for vascular studies as initiating my eval. Will continue attempts ? ? ? ?Shary Decamp [facility] ?04/16/2021, 2:44 PM ?Suanne Marker PT ?Acute Rehabilitation Services ?Pager [phone] ?Office [phone] ? ?

## 2021-04-16 NOTE — Progress Notes (Signed)
?PROGRESS NOTE ? ?Nicholas [name]  VEL:[id number] DOB: 04/13/1930 DOA: 04/15/2021 ?PCP: Windy Fast, MD  ? ?Brief Narrative: ?Nicholas [name] is a 88 y.o. male with medical history significant of hypertension, hyperlipidemia, ESRD on dialysis on TTS schedule ,coronary artery disease, diabetes type 2 who presented from home with strokelike symptoms with  intermittent episodes of confusion, slurred speech, weakness of her right hand over the past 3 to 4 days.MRI of the brain showed  moderate-sized early subacute infarct in the left MCA distribution.,  Additional punctate infarcts in the left caudate body and superior frontal gyrus.  Admitted for stroke work-up.  Neurology consulted ? ? ?Assessment & Plan: ? ?Principal Problem: ?  Stroke [facility]) ?Active Problems: ?  Lung nodule seen on imaging study ?  Hypertension ?  Hypercholesteremia ?  ESRD (end stage renal disease) on dialysis [facility]) ?  Anemia of chronic disease ?  Type II diabetes mellitus with renal manifestations ([place]) ?  CAD (coronary artery disease) ?  Chronic respiratory insufficiency ? ? ?Assessment and Plan: ?* Stroke [facility] ?Presented with right hand weakness, intermittent episodes of confusion, slurred speech.  These symptoms have completely resolved now.  ? Code stroke was called in the ED. ?MRI showed moderate-sized early subacute infarct in the left MCA distribution,additional punctate infarcts in the left caudate body and superior frontal gyrus. ?Stroke work-up initiated.  Neurology following ?Given a dose of aspirin 325 mg once.  Continue aspirin 81 mg.continue Plavix that he takes at home. Continue Lipitor. ?A1c of 7.6,LDL of 116 ?PT/OT/speech evaluation ?Echocardiogram has been ordered.  Frequent neurochecks. ? ?Chronic respiratory insufficiency ?Takes 2 L of oxygen at home at baseline.  Declines history of COPD.  As per the wife, uses oxygen for fluid buildup in the lungs. ? ?CAD (coronary artery disease) ?S/P stent placement .Takes Plavix, statin at home  which we will continue. ? ?Type II diabetes mellitus with renal manifestations ([place]) ?Takes insulin at home.  Continue sliding-scale insulin for now.  Monitor blood sugars.hemoglobin A1c of 7.6 ? ?Anemia of chronic disease ?Associated with chronic kidney disease.  Hemoglobin stable in the range of 8-9 ? ?ESRD (end stage renal disease) on dialysis [facility]) ?Dialyzed on TTS schedule.  Currently appears euvolemic. Nephrology following ? ?Hypercholesteremia ?Takes Lipitor 80 mg daily at home which will be continued. LDL of 116 ? ?Hypertension ?Mildly hypotensive on presentation.  Takes Imdur at home, will hold for now. Use as needed medication for severe hypertension ? ?Lung nodule seen on imaging study ?Chest x-ray done on 3/21 showed questionable 11 mm nodular density in the right lung apex.  Radiology recommends follow-up chest CT, can be done as an outpatient. ? ? ? ? ?  ? ? ?  ?  ? ?DVT prophylaxis:heparin injection 5,000 Units Start: 04/15/21 1900 ? ? ?  Code Status: Prior ? ?Family Communication: Wife on phone on 3/23 ? ?Patient status:Inpatient ? ?Patient is from :Home ? ?Anticipated discharge CH:ENID ? ?Estimated DC date: After full stroke work-up ? ? ?Consultants: Neurology, nephrology ? ?Procedures: Hemodialysis ? ?Antimicrobials:  ?Anti-infectives (From admission, onward)  ? ? None  ? ?  ? ? ?Subjective: ?Patient seen and examined at bedside this morning.  Hemodynamically stable.  He was at dialysis.  Denies any complaints today.  No weakness in any extremities, speech is clear.  He is alert and oriented ? ?Objective: ?Vitals:  ? 04/15/21 1955 04/15/21 2100 04/15/21 2330 04/16/21 0319  ?BP: 109/63  (!) 107/59 (!) 96/59  ?Pulse: 98  94 80  ?  Resp: '20  17 17  '$ ?Temp: 98.5 ?F (36.9 ?C)  98.7 ?F (37.1 ?C) 98.5 ?F (36.9 ?C)  ?TempSrc: Oral  Oral Oral  ?SpO2: 100% 99% 99% 100%  ?Weight:      ?Height:      ? ?No intake or output data in the 24 hours ending 04/16/21 0739 ?Filed Weights  ? 04/15/21 0928  ?Weight: 71.8 kg   ? ? ?Examination: ? ?General exam: Overall comfortable, not in distress,pleasant elderly male ?HEENT: PERRL ?Respiratory system:  no wheezes or crackles  ?Cardiovascular system: S1 & S2 heard, RRR.  ?Gastrointestinal system: Abdomen is nondistended, soft and nontender. ?Central nervous system: Alert and oriented ?Extremities: No edema, no clubbing ,no cyanosis, AV fistula on the left arm ?Skin: No rashes, no ulcers,no icterus   ? ? ?Data Reviewed: I have personally reviewed following labs and imaging studies ? ?CBC: ?Recent Labs  ?Lab 04/13/21 ?2033 04/15/21 ?1040 04/16/21 ?0210  ?WBC 8.6 8.9 6.6  ?NEUTROABS 6.5  --  4.8  ?HGB 9.0* 8.8* 8.1*  ?HCT 27.2* 27.8* 24.0*  ?MCV 100.0 103.0* 99.6  ?PLT 287 293 272  ? ?Basic Metabolic Panel: ?Recent Labs  ?Lab 04/13/21 ?2033 04/15/21 ?1040 04/16/21 ?0210  ?NA 136 139 142  ?K 3.5 3.7 4.1  ?CL 92* 96* 99  ?CO2 32 27 30  ?GLUCOSE 215* 160* 233*  ?BUN 24* 38* 45*  ?CREATININE 4.85* 8.71* 10.09*  ?CALCIUM 8.7* 8.7* 8.5*  ? ? ? ?Recent Results (from the past 240 hour(s))  ?Resp Panel by RT-PCR (Flu A&B, Covid) Nasopharyngeal Swab     Status: None  ? Collection Time: 04/13/21  8:33 PM  ? Specimen: Nasopharyngeal Swab; Nasopharyngeal(NP) swabs in vial transport medium  ?Result Value Ref Range Status  ? SARS Coronavirus 2 by RT PCR NEGATIVE NEGATIVE Final  ?  Comment: (NOTE) ?SARS-CoV-2 target nucleic acids are NOT DETECTED. ? ?The SARS-CoV-2 RNA is generally detectable in upper respiratory ?specimens during the acute phase of infection. The lowest ?concentration of SARS-CoV-2 viral copies this assay can detect is ?138 copies/mL. A negative result does not preclude SARS-Cov-2 ?infection and should not be used as the sole basis for treatment or ?other patient management decisions. A negative result may occur with  ?improper specimen collection/handling, submission of specimen other ?than nasopharyngeal swab, presence of viral mutation(s) within the ?areas targeted by this assay, and  inadequate number of viral ?copies(<138 copies/mL). A negative result must be combined with ?clinical observations, patient history, and epidemiological ?information. The expected result is Negative. ? ?Fact Sheet for Patients:  ?EntrepreneurPulse.com.au ? ?Fact Sheet for Healthcare Providers:  ?IncredibleEmployment.be ? ?This test is no t yet approved or cleared by the [place] FDA and  ?has been authorized for detection and/or diagnosis of SARS-CoV-2 by ?FDA under an Emergency Use Authorization (EUA). This EUA will remain  ?in effect (meaning this test can be used) for the duration of the ?COVID-19 declaration under Section 564(b)(1) of the Act, 21 ?U.S.C.section 360bbb-3(b)(1), unless the authorization is terminated  ?or revoked sooner.  ? ? ?  ? Influenza A by PCR NEGATIVE NEGATIVE Final  ? Influenza B by PCR NEGATIVE NEGATIVE Final  ?  Comment: (NOTE) ?The Xpert Xpress SARS-CoV-2/FLU/RSV plus assay is intended as an aid ?in the diagnosis of influenza from Nasopharyngeal swab specimens and ?should not be used as a sole basis for treatment. Nasal washings and ?aspirates are unacceptable for Xpert Xpress SARS-CoV-2/FLU/RSV ?testing. ? ?Fact Sheet for Patients: ?EntrepreneurPulse.com.au ? ?Fact Sheet for Healthcare  Providers: ?IncredibleEmployment.be ? ?This test is not yet approved or cleared by the [place] FDA and ?has been authorized for detection and/or diagnosis of SARS-CoV-2 by ?FDA under an Emergency Use Authorization (EUA). This EUA will remain ?in effect (meaning this test can be used) for the duration of the ?COVID-19 declaration under Section 564(b)(1) of the Act, 21 U.S.C. ?section 360bbb-3(b)(1), unless the authorization is terminated or ?revoked. ? ?Performed at [facility], [address], [place] [address] ?  ?Resp Panel by RT-PCR (Flu A&B, Covid) Nasopharyngeal Swab     Status: None  ? Collection  Time: 04/15/21  5:50 PM  ? Specimen: Nasopharyngeal Swab; Nasopharyngeal(NP) swabs in vial transport medium  ?Result Value Ref Range Status  ? SARS Coronavirus 2 by RT PCR NEGATIVE NEGATIVE Final  ?

## 2021-04-16 NOTE — Progress Notes (Signed)
PT Cancellation Note ? ?Patient Details ?Name: Nicholas [name] ?MRN: [id number] ?DOB: 1930/11/20 ? ? ?Cancelled Treatment:    Reason Eval/Treat Not Completed: Patient at procedure or test/unavailable. Pt currently in HD. Will follow up later. ? ? ?Shary Decamp [facility] ?04/16/2021, 8:35 AM ?Suanne Marker PT ?Acute Rehabilitation Services ?Pager [phone] ?Office [phone] ? ? ? ?

## 2021-04-17 DIAGNOSIS — I63512 Cerebral infarction due to unspecified occlusion or stenosis of left middle cerebral artery: Secondary | ICD-10-CM | POA: Diagnosis not present

## 2021-04-17 LAB — BASIC METABOLIC PANEL
Anion gap: 9 (ref 5–15)
BUN: 23 mg/dL (ref 8–23)
CO2: 31 mmol/L (ref 22–32)
Calcium: 8.6 mg/dL — ABNORMAL LOW (ref 8.9–10.3)
Chloride: 99 mmol/L (ref 98–111)
Creatinine, Ser: 6.34 mg/dL — ABNORMAL HIGH (ref 0.61–1.24)
GFR, Estimated: 8 mL/min — ABNORMAL LOW (ref >60)
Glucose, Bld: 144 mg/dL — ABNORMAL HIGH (ref 70–99)
Potassium: 3.7 mmol/L (ref 3.5–5.1)
Sodium: 139 mmol/L (ref 135–145)

## 2021-04-17 LAB — GLUCOSE, CAPILLARY
Glucose-Capillary: 140 mg/dL — ABNORMAL HIGH (ref 70–99)
Glucose-Capillary: 188 mg/dL — ABNORMAL HIGH (ref 70–99)
Glucose-Capillary: 106 mg/dL — ABNORMAL HIGH (ref 70–99)

## 2021-04-17 LAB — HEPATITIS B SURFACE ANTIBODY,QUALITATIVE: Hep B S Ab: REACTIVE — AB

## 2021-04-17 LAB — HEPATITIS B SURFACE ANTIGEN: Hepatitis B Surface Ag: NONREACTIVE

## 2021-04-17 MED ORDER — MIDODRINE HCL 5 MG PO TABS: 5.0000 mg | ORAL_TABLET | Freq: Three times a day (TID) | ORAL | 1 refills | Status: AC

## 2021-04-17 MED ORDER — ASPIRIN 81 MG PO TBEC: 81.0000 mg | DELAYED_RELEASE_TABLET | Freq: Every day | ORAL | 2 refills | Status: DC

## 2021-04-17 MED ORDER — MIDODRINE HCL 5 MG PO TABS
5.0000 mg | ORAL_TABLET | Freq: Three times a day (TID) | ORAL | Status: DC
  Administered 2021-04-17: 5 mg via ORAL
  Filled 2021-04-17: qty 1

## 2021-04-17 NOTE — Care Management (Signed)
?  Transition of Care (TOC) Screening Note ? ? ?Patient Details  ?Name: Nicholas [name] ?Date of Birth: 12/27/1930 ? ? ?Transition of Care (TOC) CM/SW Contact:    ?Carles Collet, RN ?Phone Number: ?04/17/2021, 9:42 AM ? ? ? ?Transition of Care Department [facility]) has reviewed patient and we will continue to monitor patient advancement through interdisciplinary progression rounds.  ? ?Patient admitted from home w spouse, CVA, PT OT recs pending.  ?

## 2021-04-17 NOTE — Procedures (Signed)
? ?  I was present at this dialysis session, have reviewed the session itself and made  appropriate changes ?Kelly Splinter MD ?[employer] ?pager [phone]   ?04/17/2021, 3:59 PM ? ? ?

## 2021-04-17 NOTE — Progress Notes (Signed)
Discharged to home after IV access removed and discharge instructions given.   ?

## 2021-04-17 NOTE — Evaluation (Signed)
Speech Language Pathology Evaluation ?Patient Details ?Name: Nicholas [name] ?MRN: [id number] ?DOB: Dec 16, 1930 ?Today's Date: 04/17/2021 ?Time: 2841-3244 ?SLP Time Calculation (min) (ACUTE ONLY): 23 min ? ?Problem List:  ?Patient Active Problem List  ? Diagnosis Date Noted  ? Stroke [facility]) 04/15/2021  ? Chronic respiratory insufficiency 04/15/2021  ? History of loop recorder 01/28/2021  ? Syncope 09/09/2020  ? Acute GI bleeding 09/09/2020  ? Bradycardia   ? Complete heart block ([place])   ? Acute blood loss anemia 07/04/2020  ? Left renal mass 06/06/2020  ? Chest pain 06/05/2020  ? Hypotension   ? Syncope and collapse 03/17/2019  ? Acute anemia 09/28/2018  ? GIB (gastrointestinal bleeding) 09/27/2018  ? Anemia due to blood loss 09/27/2018  ? Type II diabetes mellitus with renal manifestations ([place]) 09/27/2018  ? GERD (gastroesophageal reflux disease) 09/27/2018  ? CAD (coronary artery disease) 09/27/2018  ? Chronic systolic CHF (congestive heart failure) ([place]) 09/27/2018  ? NSTEMI (non-ST elevated myocardial infarction) ([place]) 08/06/2018  ? Hypertension   ? Hypercholesteremia   ? ESRD (end stage renal disease) on dialysis [facility])   ? Anemia of chronic disease   ? Lung nodule seen on imaging study 04/30/2012  ? ?Past Medical History:  ?Past Medical History:  ?Diagnosis Date  ? Arthritis   ? Chronic kidney disease   ? Diabetes mellitus ([place])   ? Dialysis patient [facility])   ? GERD (gastroesophageal reflux disease)   ? occ  ? Heart murmur   ? Hypercholesteremia   ? Hypertension   ? Psoriasis   ? ?Past Surgical History:  ?Past Surgical History:  ?Procedure Laterality Date  ? CARDIAC CATHETERIZATION  09  ? "some blockage" with collaterals by cath at [facility] ~ 2009; no intervention required; reportedly, no routine cardiology f/u recommended   ? COLONOSCOPY WITH PROPOFOL N/A 07/06/2020  ? Procedure: COLONOSCOPY WITH PROPOFOL;  Surgeon: Ronald Lobo, MD;  Location: [place];  Service: Endoscopy;  Laterality: N/A;  ? CORONARY ATHERECTOMY  N/A 08/09/2018  ? Procedure: CORONARY ATHERECTOMY;  Surgeon: Jettie Booze, MD;  Location: [place] [facility];  Service: Cardiovascular;  Laterality: N/A;  ? CORONARY STENT INTERVENTION N/A 08/09/2018  ? Procedure: CORONARY STENT INTERVENTION;  Surgeon: Jettie Booze, MD;  Location: [place] [facility];  Service: Cardiovascular;  Laterality: N/A;  ? ESOPHAGOGASTRODUODENOSCOPY (EGD) WITH PROPOFOL N/A 07/06/2020  ? Procedure: ESOPHAGOGASTRODUODENOSCOPY (EGD) WITH PROPOFOL;  Surgeon: Ronald Lobo, MD;  Location: [place];  Service: Endoscopy;  Laterality: N/A;  ? INGUINAL HERNIA REPAIR Right 04/13/2012  ? Procedure: HERNIA REPAIR INGUINAL INCARCERATED;  Surgeon: Gayland Curry, MD;  Location: [place];  Service: General;  Laterality: Right;  ? INSERTION OF MESH Right 04/13/2012  ? Procedure: INSERTION OF MESH;  Surgeon: Gayland Curry, MD;  Location: [place];  Service: General;  Laterality: Right;  ? IR THORACENTESIS ASP PLEURAL SPACE W/IMG GUIDE  06/08/2020  ? LEFT HEART CATH N/A 08/09/2018  ? Procedure: Left Heart Cath;  Surgeon: Jettie Booze, MD;  Location: [place] [facility];  Service: Cardiovascular;  Laterality: N/A;  ? LEFT HEART CATH AND CORONARY ANGIOGRAPHY N/A 08/08/2018  ? Procedure: LEFT HEART CATH AND CORONARY ANGIOGRAPHY;  Surgeon: Troy Sine, MD;  Location: [place] [facility];  Service: Cardiovascular;  Laterality: N/A;  ? LEFT HEART CATH AND CORONARY ANGIOGRAPHY N/A 06/05/2020  ? Procedure: LEFT HEART CATH AND CORONARY ANGIOGRAPHY;  Surgeon: Belva Crome, MD;  Location: [place] [facility];  Service: Cardiovascular;  Laterality: N/A;  ?  VASECTOMY    ? ?HPI:  ?88 y.o. male with medical history significant of hypertension, hyperlipidemia, ESRD on dialysis on TTS schedule ,coronary artery disease, diabetes type 2 who presented from home with strokelike symptoms.  Patient lives with wife.  Patient is a poor historian so information was also collected from his wife on phone.  As per the  patient his wife, he was having intermittent episodes of confusion, slurred speech, weakness of her right hand over the past 3 to 4 days.  He initially presented to the ED on 3/21 with the symptoms.  CT head done at that time did not show any acute intracranial abnormalities.  He was then discharged to home.  At home, he continued to have the symptoms.  As per the wife, this time his right-hand weakness was more pronounced .He was having difficulty writing or typing.  They  called 911 and presented again to the emergency department.   Code stroke was called on admission; MRI 04/15/21 indicated moderate sized early subacute infarct in L MCA; Passed [facility]; SLE generated  ? ?Assessment / Plan / Recommendation ?Clinical Impression ? Pt administered SLUMS assessment (Phillips Mental Status Examination) with a score of 78/24 elicited with deficits noted within the areas of attention, memory recall, simple problem solving, organization, auditory comprehension and executive functioning.  Pt unable to recall objects after a time constraint without categorization cue for 4/5, but recalled 1 without cueing.  Simple calculation task and digit repetition backwards difficult for pt to complete.  Oriented x4, but only recalled 25% of simple paragraph when asked questions relating to content of paragraph.  Pt uses R hand dominantly, so graphic expression/written language not assessed.  Pt able to read simple environmental signs within the room accurately, but complex reading tasks not assessed.  Pt with precise articulation (100% intelligible) within conversation, but perseveration noted intermittently during conversation and pt c/o infrequent anomia.  No family present to determine baseline cognition.  ST will f/u for cognitive/linguistic changes and recommend f/u at d/c either CIR and/or home health per family preference.   ?   ?SLP Assessment ? SLP Recommendation/Assessment: Patient needs continued Speech Language  Pathology Services ?SLP Visit Diagnosis: Attention and concentration deficit;Cognitive communication deficit (R41.841) ?Attention and concentration deficit following: Cerebral infarction  ?  ?Recommendations for follow up therapy are one component of a multi-disciplinary discharge planning process, led by the attending physician.  Recommendations may be updated based on patient status, additional functional criteria and insurance authorization. ?   ?Follow Up Recommendations ? Follow physician's recommendations for discharge plan and follow up therapies  ?  ?Assistance Recommended at Discharge ? Intermittent Supervision/Assistance  ?Functional Status Assessment Patient has had a recent decline in their functional status and demonstrates the ability to make significant improvements in function in a reasonable and predictable amount of time.  ?Frequency and Duration min 2x/week  ?1 week ?  ?   ?SLP Evaluation ?Cognition ? Overall Cognitive Status: Impaired/Different from baseline ?Arousal/Alertness: Awake/alert ?Orientation Level: Oriented X4 ?Year: 2023 ?Month: March ?Day of Week: Correct ?Attention: Sustained ?Sustained Attention: Impaired ?Sustained Attention Impairment: Verbal basic;Functional basic ?Memory: Impaired ?Memory Impairment: Decreased recall of new information;Retrieval deficit;Decreased short term memory ?Decreased Short Term Memory: Verbal basic;Functional basic ?Awareness: Appears intact ?Problem Solving: Impaired ?Problem Solving Impairment: Verbal basic;Functional basic ?Behaviors: Perseveration  ?  ?   ?Comprehension ? Auditory Comprehension ?Overall Auditory Comprehension: Impaired ?Yes/No Questions: Within Functional Limits ?Commands: Impaired ?Multistep Basic Commands: 25-49% accurate ?Conversation: Simple ?Interfering  Components: Attention;Working memory ?EffectiveTechniques: Repetition;Increased volume;Visual/Gestural cues ?Visual Recognition/Discrimination ?Discrimination: Within Function  Limits ?Reading Comprehension ?Reading Status: Not tested (Pt able to read environmental signs)  ?  ?Expression Expression ?Primary Mode of Expression: Verbal ?Verbal Expression ?Overall Verbal Expression: Impa

## 2021-04-17 NOTE — Discharge Summary (Signed)
Physician Discharge Summary  ?Nicholas [name] VFI:[id number] DOB: [birth year] DOA: 04/15/2021 ? ?PCP: Windy Fast, MD ? ?Admit date: 04/15/2021 ?Discharge date: 04/17/2021 ? ?Admitted From: Home ?Disposition:  Home ? ?Discharge Condition:Stable ?CODE STATUS:FULL ?Diet recommendation: Renal  ? ?Brief/Interim Summary: ? ?[name] is a 88 y.o. male with medical history significant of hypertension, hyperlipidemia, ESRD on dialysis on TTS schedule ,coronary artery disease, diabetes type 2 who presented from home with strokelike symptoms with  intermittent episodes of confusion, slurred speech, weakness of her right hand over the past 3 to 4 days.MRI of the brain showed  moderate-sized early subacute infarct in the left MCA distribution.Admitted for stroke work-up.  Neurology consulted,stroke work up completed. PT/OT recommended HH. Medically stable for dc today after dialysis. ? ?Following problems were addressed during his hospitalization: ? ? ?Ischemic Stroke  ?Presented with right hand weakness, intermittent episodes of confusion, slurred speech.  These symptoms have completely resolved now.  ? Code stroke was called in the ED. ?MRI showed moderate-sized early subacute infarct in the left MCA distribution,additional punctate infarcts in the left caudate body and superior frontal gyrus. MRA head and neck showed left M2 stenosis correlating to the infarct.  Stroke might be associated with hypotension ?Stroke work-up initiated,completed now.  Neurology  was following ?Given a dose of aspirin 325 mg once. ?A1c of 7.6,LDL of 116 ?Neurology recommended to continue aspirin Plavix for 3 months and then continue Plavix alone.  Continue Lipitor 80 mg daily ?PT/OT/speech evaluation done, recommended home health ?Echocardiogram showed EF of 60 to 41%, grade 1 diastolic dysfunction, no intracardiac source of emboli.  No evidence of A-fib in telemetry ?He will follow-up with [facility] neurology in a month ? ?Chronic respiratory  insufficiency ?Takes 2 L of oxygen at home at baseline.  Declines history of COPD.  As per the wife, uses oxygen for fluid buildup in the lungs. ?  ?CAD (coronary artery disease) ?S/P stent placement .Takes Plavix, statin at home which we will continue. ?  ?Type II diabetes mellitus with renal manifestations ([place]) ?Takes insulin at home.  hemoglobin A1c of 7.6 ?  ?Anemia of chronic disease ?Associated with chronic kidney disease.  Hemoglobin stable in the range of 8-9 ?  ?ESRD (end stage renal disease) on dialysis [facility]) ?Dialyzed on TTS schedule.  Nephrology following for dialysis ?  ?Hypercholesteremia ?Takes Lipitor 80 mg daily at home which will be continued. LDL of 116 ?  ?Hypertension ?  Takes Imdur at home, discontinued.  Started on midodrine for persistent low blood pressure ? ?Lung nodule seen on imaging study ?Chest x-ray done on 3/21 showed questionable 11 mm nodular density in the right lung apex.  Radiology recommends follow-up chest CT, can be done as an outpatient. ?  ?  ?  ?  ?Discharge Diagnoses:  ?Principal Problem: ?  Stroke [facility]) ?Active Problems: ?  Lung nodule seen on imaging study ?  Hypertension ?  Hypercholesteremia ?  ESRD (end stage renal disease) on dialysis [facility]) ?  Anemia of chronic disease ?  Type II diabetes mellitus with renal manifestations ([place]) ?  CAD (coronary artery disease) ?  Chronic respiratory insufficiency ? ? ? ?Discharge Instructions ? ?Discharge Instructions   ? ? Ambulatory referral to Neurology   Complete by: As directed ?  ? Follow up with stroke clinic NP (Jessica Camino or Cecille Rubin, if both not available, consider Zachery Dauer, or [place]) at [facility] in about 4 weeks. Thanks.  ? Diet - low sodium heart healthy  Complete by: As directed ?  ? Renal diet  ? Discharge instructions   Complete by: As directed ?  ? 1)Please take prescribed medications as instructed ?2)Follow up with [facility] neurology in 4 weeks,name and number of the provider group has been  attached ?3)Monitor your blood pressure at home ?4)Your chest Xray showed questionable 11 mm nodular density in the right lung apex.  We recommend to do a  follow-up chest CT as an outpatient non-emergently.  ? Increase activity slowly   Complete by: As directed ?  ? ?  ? ?Allergies as of 04/17/2021   ? ?   Reactions  ? Hydrochlorothiazide Other (See Comments)  ? Unknown per [place] records  ? ?  ? ?  ?Medication List  ?  ? ?STOP taking these medications   ? ?isosorbide dinitrate 20 MG tablet ?Commonly known as: ISORDIL ?  ? ?  ? ?TAKE these medications   ? ?acetaminophen 500 MG tablet ?Commonly known as: TYLENOL ?Take 500 mg by mouth 3 (three) times daily as needed for moderate pain. ?  ?ArmonAir Digihaler 232 MCG/ACT Aepb ?Generic drug: Fluticasone Propionate(sensor) ?Inhale 1 puff into the lungs 2 (two) times daily. ?  ?aspirin 81 MG EC tablet ?Take 1 tablet (81 mg total) by mouth daily. Swallow whole. ?Start taking on: April 18, 2021 ?  ?atorvastatin 80 MG tablet ?Commonly known as: LIPITOR ?Take 80 mg by mouth at bedtime. ?  ?cinacalcet 30 MG tablet ?Commonly known as: SENSIPAR ?Take by mouth 3 (three) times a week. [place], Sat ?  ?clopidogrel 75 MG tablet ?Commonly known as: PLAVIX ?Take 1 tablet (75 mg total) by mouth daily with breakfast. Restart after 5 days ?  ?diphenhydrAMINE 25 mg capsule ?Commonly known as: BENADRYL ?Take 1 capsule by mouth as needed. ?  ?insulin NPH-regular Human (70-30) 100 UNIT/ML injection ?Inject 10 Units into the skin daily with breakfast. Takes 20 units in the morning if blood sugar is over 140 ?  ?midodrine 5 MG tablet ?Commonly known as: PROAMATINE ?Take 1 tablet (5 mg total) by mouth 3 (three) times daily with meals. ?  ?nitroGLYCERIN 0.4 MG SL tablet ?Commonly known as: NITROSTAT ?Place 1 tablet (0.4 mg total) under the tongue every 5 (five) minutes x 3 doses as needed for chest pain. ?  ? ?  ? ? Follow-up Information   ? ? [facility]. Schedule an  appointment as soon as possible for a visit in 1 month(s).   ?Specialty: Neurology ?Why: stroke clinic ?Contact information: ?[address] [place] ?[phone] ? ?  ?  ? ?  ?  ? ?  ? ?Allergies  ?Allergen Reactions  ? Hydrochlorothiazide Other (See Comments)  ?  Unknown per [place] records  ? ? ?Consultations: ? ?Neurology ? ?Procedures/Studies: ?CT Head Wo Contrast ? ?Result Date: 04/13/2021 ?CLINICAL DATA:  Altered mental status. EXAM: CT HEAD WITHOUT CONTRAST TECHNIQUE: Contiguous axial images were obtained from the base of the skull through the vertex without intravenous contrast. RADIATION DOSE REDUCTION: This exam was performed according to the departmental dose-optimization program which includes automated exposure control, adjustment of the mA and/or kV according to patient size and/or use of iterative reconstruction technique. COMPARISON:  None. FINDINGS: Brain: Normal for age atrophy. No intracranial hemorrhage, mass effect, or midline shift. No hydrocephalus. The basilar cisterns are patent. Mild for age chronic small vessel ischemic change. No evidence of territorial infarct or acute ischemia. No extra-axial or intracranial fluid collection. Vascular:  Atherosclerosis of skullbase vasculature without hyperdense vessel or abnormal calcification. Skull: No fracture or focal lesion. Sinuses/Orbits: Paranasal sinuses and mastoid air cells are clear. The visualized orbits are unremarkable. Bilateral cataract resection. Other: None. IMPRESSION: 1. No acute intracranial abnormality. 2. Age-related atrophy and chronic small vessel ischemic change. Electronically Signed   By: Keith Rake M.D.   On: 04/13/2021 20:37  ? ?MR ANGIO HEAD WO CONTRAST ? ?Result Date: 04/16/2021 ?CLINICAL DATA:  Stroke follow-up EXAM: MRA HEAD WITHOUT CONTRAST TECHNIQUE: Angiographic images of the Circle of Willis were acquired using MRA technique without intravenous contrast. COMPARISON:  Brain MRI  from yesterday FINDINGS: Anterior circulation: Diminished flow in a left MCA branch marked on series 5, correlating with the acute infarct. Apparent luminal filling defect at the left M2 level which is nonocclusive. No contra

## 2021-04-17 NOTE — Progress Notes (Signed)
OT Cancellation Note ? ?Patient Details ?Name: Nicholas [name] ?MRN: [id number] ?DOB: 08-12-1930 ? ? ?Cancelled Treatment:    Reason Eval/Treat Not Completed: Patient at procedure or test/ unavailable (OT to f/u as appropriate) ? ?[address] Pusch ?04/17/2021, 2:34 PM ?

## 2021-04-17 NOTE — Plan of Care (Signed)
?  Problem: Health Behavior/Discharge Planning: ?Goal: Ability to manage health-related needs will improve ?Outcome: Adequate for Discharge ?  ?Problem: Clinical Measurements: ?Goal: Ability to maintain clinical measurements within normal limits will improve ?Outcome: Adequate for Discharge ?Goal: Will remain free from infection ?Outcome: Adequate for Discharge ?  ?Problem: Activity: ?Goal: Risk for activity intolerance will decrease ?Outcome: Adequate for Discharge ?  ?Problem: Nutrition: ?Goal: Adequate nutrition will be maintained ?Outcome: Adequate for Discharge ?  ?Problem: Coping: ?Goal: Level of anxiety will decrease ?Outcome: Adequate for Discharge ?  ?Problem: Elimination: ?Goal: Will not experience complications related to bowel motility ?Outcome: Adequate for Discharge ?Goal: Will not experience complications related to urinary retention ?Outcome: Adequate for Discharge ?  ?Problem: Pain Managment: ?Goal: General experience of comfort will improve ?Outcome: Adequate for Discharge ?  ?Problem: Safety: ?Goal: Ability to remain free from injury will improve ?Outcome: Adequate for Discharge ?  ?Problem: Skin Integrity: ?Goal: Risk for impaired skin integrity will decrease ?Outcome: Adequate for Discharge ?  ?Problem: Education: ?Goal: Knowledge of disease or condition will improve ?Outcome: Adequate for Discharge ?Goal: Knowledge of secondary prevention will improve (SELECT ALL) ?Outcome: Adequate for Discharge ?Goal: Knowledge of patient specific risk factors will improve (INDIVIDUALIZE FOR PATIENT) ?Outcome: Adequate for Discharge ?Goal: Individualized Educational Video(s) ?Outcome: Adequate for Discharge ?  ?Problem: Coping: ?Goal: Will verbalize positive feelings about self ?Outcome: Adequate for Discharge ?Goal: Will identify appropriate support needs ?Outcome: Adequate for Discharge ?  ?Problem: Health Behavior/Discharge Planning: ?Goal: Ability to manage health-related needs will improve ?Outcome:  Adequate for Discharge ?  ?Problem: Self-Care: ?Goal: Ability to participate in self-care as condition permits will improve ?Outcome: Adequate for Discharge ?Goal: Verbalization of feelings and concerns over difficulty with self-care will improve ?Outcome: Adequate for Discharge ?  ?Problem: Nutrition: ?Goal: Risk of aspiration will decrease ?Outcome: Adequate for Discharge ?Goal: Dietary intake will improve ?Outcome: Adequate for Discharge ?  ?Problem: Ischemic Stroke/TIA Tissue Perfusion: ?Goal: Complications of ischemic stroke/TIA will be minimized ?Outcome: Adequate for Discharge ?  ?Problem: Spontaneous Subarachnoid Hemorrhage Tissue Perfusion: ?Goal: Complications of Spontaneous Subarachnoid Hemorrhage will be minimized ?Outcome: Adequate for Discharge ?  ?

## 2021-04-17 NOTE — Plan of Care (Signed)
?  Problem: Activity: ?Goal: Risk for activity intolerance will decrease ?Outcome: Not Progressing ?  ?Problem: Coping: ?Goal: Level of anxiety will decrease ?Outcome: Not Progressing ?  ?Problem: Safety: ?Goal: Ability to remain free from injury will improve ?Outcome: Not Progressing ?  ?

## 2021-04-17 NOTE — TOC Transition Note (Signed)
Transition of Care (TOC) - CM/SW Discharge Note ? ? ?Patient Details  ?Name: Nicholas [name] ?MRN: [id number] ?Date of Birth: 02-Sep-1930 ? ?Transition of Care (TOC) CM/SW Contact:  ?Carles Collet, RN ?Phone Number: ?04/17/2021, 3:25 PM ? ? ?Clinical Narrative:    ?Patient unavailable, spoke w wife. She is agreeable to [facility] services, no preference for provider.  ?Referral accepted by [facility]. ?[name] states they have all needed DME at home. ?No other TOC needs identified.  ? ? ? ?Final next level of care: [place] ?Barriers to Discharge: No Barriers Identified ? ? ?Patient Goals and CMS Choice ?Patient states their goals for this hospitalization and ongoing recovery are:: to go home ?CMS Medicare.gov Compare Post Acute Care list provided to:: Other (Comment Required) ?Choice offered to / list presented to : Spouse ? ?Discharge Placement ?  ?           ?  ?  ?  ?  ? ?Discharge Plan and Services ?  ?  ?           ?  ?  ?  ?  ?  ?HH Arranged: PT, OT ?[place] Agency: [place] ?Date HH Agency Contacted: 04/17/21 ?Time [place]: 4982 ?Representative spoke with at [place]: Tommi Rumps ? ?Social Determinants of Health (SDOH) Interventions ?  ? ? ?Readmission Risk Interventions ?   ? View : No data to display.  ?  ?  ?  ? ? ? ? ? ?

## 2021-04-18 LAB — HEPATITIS B SURFACE ANTIBODY, QUANTITATIVE: Hep B S AB Quant (Post): 22.6 m[IU]/mL (ref >9.9)

## 2021-04-20 ENCOUNTER — Telehealth: Payer: Self-pay | Admitting: Interventional Cardiology

## 2021-04-20 NOTE — Telephone Encounter (Signed)
Pt c/o medication issue: ? ?1. Name of Medication: isosorbide dinitrate  ? ?2. How are you currently taking this medication (dosage and times per day)?   ? ?3. Are you having a reaction (difficulty breathing--STAT)? no ? ?4. What is your medication issue? Calling to see why patient is no longer on this medication  ? ?

## 2021-04-20 NOTE — Telephone Encounter (Signed)
Called pt back and daughter answered.  Daughter not on DPR.  Pt is at dialysis so unable to give permission.  Daughter will have her mother call when she returns home as she is on the [place].  ? ?Imdur was stopped at most recent hospitalization due to hypotensive episodes.  Pt currently on dialysis, so concerns for increased drops in BP, therefore, Imdur was stopped.   ?

## 2021-04-20 NOTE — Telephone Encounter (Signed)
Spoke with pt and made him aware of information.  Pt verbalized understanding and was appreciative for call.  He is aware to call the office if any angina off the Imdur.  ?

## 2021-04-21 DIAGNOSIS — I1311 Hypertensive heart and chronic kidney disease without heart failure, with stage 5 chronic kidney disease, or end stage renal disease: Secondary | ICD-10-CM | POA: Diagnosis not present

## 2021-04-21 DIAGNOSIS — D631 Anemia in chronic kidney disease: Secondary | ICD-10-CM | POA: Diagnosis not present

## 2021-04-21 DIAGNOSIS — Z794 Long term (current) use of insulin: Secondary | ICD-10-CM | POA: Diagnosis not present

## 2021-04-21 DIAGNOSIS — Z95818 Presence of other cardiac implants and grafts: Secondary | ICD-10-CM | POA: Diagnosis not present

## 2021-04-21 DIAGNOSIS — E7800 Pure hypercholesterolemia, unspecified: Secondary | ICD-10-CM | POA: Diagnosis not present

## 2021-04-21 DIAGNOSIS — E1122 Type 2 diabetes mellitus with diabetic chronic kidney disease: Secondary | ICD-10-CM | POA: Diagnosis not present

## 2021-04-21 DIAGNOSIS — Z9181 History of falling: Secondary | ICD-10-CM | POA: Diagnosis not present

## 2021-04-21 DIAGNOSIS — R29898 Other symptoms and signs involving the musculoskeletal system: Secondary | ICD-10-CM | POA: Diagnosis not present

## 2021-04-21 DIAGNOSIS — I083 Combined rheumatic disorders of mitral, aortic and tricuspid valves: Secondary | ICD-10-CM | POA: Diagnosis not present

## 2021-04-21 DIAGNOSIS — I69398 Other sequelae of cerebral infarction: Secondary | ICD-10-CM | POA: Diagnosis not present

## 2021-04-21 DIAGNOSIS — N186 End stage renal disease: Secondary | ICD-10-CM | POA: Diagnosis not present

## 2021-04-21 DIAGNOSIS — Z9981 Dependence on supplemental oxygen: Secondary | ICD-10-CM | POA: Diagnosis not present

## 2021-04-21 DIAGNOSIS — Z7902 Long term (current) use of antithrombotics/antiplatelets: Secondary | ICD-10-CM | POA: Diagnosis not present

## 2021-04-21 DIAGNOSIS — J90 Pleural effusion, not elsewhere classified: Secondary | ICD-10-CM | POA: Diagnosis not present

## 2021-04-21 DIAGNOSIS — I2510 Atherosclerotic heart disease of native coronary artery without angina pectoris: Secondary | ICD-10-CM | POA: Diagnosis not present

## 2021-04-21 DIAGNOSIS — I69328 Other speech and language deficits following cerebral infarction: Secondary | ICD-10-CM | POA: Diagnosis not present

## 2021-04-21 DIAGNOSIS — I252 Old myocardial infarction: Secondary | ICD-10-CM | POA: Diagnosis not present

## 2021-04-21 DIAGNOSIS — Z955 Presence of coronary angioplasty implant and graft: Secondary | ICD-10-CM | POA: Diagnosis not present

## 2021-04-21 DIAGNOSIS — Z7951 Long term (current) use of inhaled steroids: Secondary | ICD-10-CM | POA: Diagnosis not present

## 2021-04-21 DIAGNOSIS — R918 Other nonspecific abnormal finding of lung field: Secondary | ICD-10-CM | POA: Diagnosis not present

## 2021-04-21 DIAGNOSIS — Z7982 Long term (current) use of aspirin: Secondary | ICD-10-CM | POA: Diagnosis not present

## 2021-04-23 ENCOUNTER — Other Ambulatory Visit: Payer: Self-pay | Admitting: *Deleted

## 2021-04-23 ENCOUNTER — Encounter: Payer: Self-pay | Admitting: *Deleted

## 2021-04-23 DIAGNOSIS — N186 End stage renal disease: Secondary | ICD-10-CM | POA: Diagnosis not present

## 2021-04-23 DIAGNOSIS — I1311 Hypertensive heart and chronic kidney disease without heart failure, with stage 5 chronic kidney disease, or end stage renal disease: Secondary | ICD-10-CM | POA: Diagnosis not present

## 2021-04-23 DIAGNOSIS — I69328 Other speech and language deficits following cerebral infarction: Secondary | ICD-10-CM | POA: Diagnosis not present

## 2021-04-23 DIAGNOSIS — R29898 Other symptoms and signs involving the musculoskeletal system: Secondary | ICD-10-CM | POA: Diagnosis not present

## 2021-04-23 DIAGNOSIS — I69398 Other sequelae of cerebral infarction: Secondary | ICD-10-CM | POA: Diagnosis not present

## 2021-04-23 DIAGNOSIS — E1122 Type 2 diabetes mellitus with diabetic chronic kidney disease: Secondary | ICD-10-CM | POA: Diagnosis not present

## 2021-04-23 NOTE — Patient Instructions (Signed)
Visit Information ? ?Thank you for taking time to visit with me today. Please don't hesitate to contact me if I can be of assistance to you before our next scheduled telephone appointment. ? ?Following are the goals we discussed today:  ?Monitor blood pressure daily. ?Follow low cholesterol/low salt diet ? ?Our next appointment is by telephone in 2 weeks. ? ?The patient verbalized understanding of instructions, educational materials, and care plan provided today and agreed to receive a mailed copy of patient instructions, educational materials, and care plan.  ? ?The patient has been provided with contact information for the care management team and has been advised to call with any health related questions or concerns.  ? ?Valente David, RN, MSN, CCM ?[facility] Care Management  ?Community Care Manager ?[phone] ? ? ? ? ?

## 2021-04-23 NOTE — Patient Outreach (Signed)
Received a red flag Emmi stroke notification ?I have assigned Valente David, RN to call for follow up and determine if there are any Case Management needs.  ?  ?[place] Care, CBCS, CMAA ?[place] Management Assistant ?[place] Management ?[phone]   ?

## 2021-04-23 NOTE — Patient Outreach (Signed)
?[facility]) Care Management ?Telephonic RN Care Manager Note ? ? ?04/23/2021 ?Name:  Nicholas [name] MRN:  [id number] DOB:  Mar 08, 1930 ? ?Summary: ?RED ON EMMI ALERT - Stroke ?Day # 1 ?Date: 3/28 ?Red Alert Reason: Scheduled follow up appointment?  NO ?Know how and when to take medications?  NO ?Problems setting up rehab?   YES ? ? ?Day # 3 ?Date: 3/3 ?Red Alert Reason: Feeling worse overall?  YES ? ?Outreach attempt #1, successful.  Identity verified.  This care manager introduced self and stated purpose of call.  [facility] care management services explained.   ? ?Social:  Spoke with member's wife, DPR on file.  She would also like her daughter's information to be placed in chart for coordination of care Nicholas [name], [phone]).  Son's contact information is already in chart, he lives with them to provide additional support. ? ?Wife report member does need assistance with some ADLs currently, however she feel he is doing well post discharge.  He is using walker for mobility, active with [facility] for PT and OT, next visit today.  He does still have some weakness in the right hand, which is the reason they reported member was feeling worse.  He has not had any additional issues.   ? ?Conditions: Per chart, has history of HTN (although currently experiencing hypotension), CAD, CHF, GERD, DM with A1C of 7.6, ESRD on HD TTS, and hypercholesteremia.  Blood pressure, blood sugar, and weights all monitored daily.  Today's blood pressure 90/60, wife report this has increased with the use of Midodrine.  Blood sugar was 163, on sliding scale insulin.  State it is usually lower in the mornings, was slightly elevated today.  Weight has remained stable at 156 pounds. ? ?Medications: Reviewed with wife, report adherence. Denies any questions regarding management/administration or need for financial assistance.  ? ?Appointments: Has follow up appointment with PCP at the [facility] next month, wife unsure of  exact date and she does not have her calendar handy.  State the clinic will call if member can be seen sooner as April was the next available appointment.  He also has office visit with neurology for 5/10.  Either son or daughter will provide transportation to appointments.   ? ?Advance Directives: Wife is POA. She also grants permission to speak with son Nicholas [name] and daughter Nicholas [name]. ? ?Consent: Agrees to [facility] involvement.   ? ?Subjective: ?Nicholas [name] is an 88 y.o. year old male who is a primary patient of Nicholas Fast, MD. The care management team was consulted for assistance with care management and/or care coordination needs.   ? ?Telephonic RN Care Manager completed Telephone Visit today. ? ?Objective:  ? ?Medications Reviewed Today   ? ? Reviewed by Valente David, RN (Registered Nurse) on 04/23/21 at 70  Med List Status: <None>  ? ?Medication Order Taking? Sig Documenting Provider Last Dose Status Informant  ?acetaminophen (TYLENOL) 500 MG tablet [id number]  Take 500 mg by mouth 3 (three) times daily as needed for moderate pain. [provider]  Active Self, Child  ?aspirin EC 81 MG EC tablet [id number]  Take 1 tablet (81 mg total) by mouth daily. Swallow whole. [name], MD  Active   ?atorvastatin (LIPITOR) 80 MG tablet [id number]  Take 80 mg by mouth at bedtime. [provider]  Active Self, Child  ?cinacalcet (SENSIPAR) 30 MG tablet [id number]  Take by mouth 3 (three) times a week. Harrell Lark, Sat [provider]  Active Self, Child  ?  clopidogrel (PLAVIX) 75 MG tablet [id number]  Take 1 tablet (75 mg total) by mouth daily with breakfast. Restart after 5 days Domenic Polite, MD  Active Self, Child  ?diphenhydrAMINE (BENADRYL) 25 mg capsule [id number]  Take 1 capsule by mouth as needed. [provider]  Active Self, Child  ?Fluticasone Propionate,sensor, (ARMONAIR DIGIHALER) 232 MCG/ACT AEPB [id number]  Inhale 1 puff into the lungs 2 (two) times daily. [provider]  Active Self, Child  ?insulin NPH-regular Human (70-30) 100 UNIT/ML injection [id number]  Inject 10 Units into the skin daily with breakfast. Takes 20 units in the morning if blood sugar is over 140 Domenic Polite, MD  Active Self, Child  ?midodrine (PROAMATINE) 5 MG tablet [id number]  Take 1 tablet (5 mg total) by mouth 3 (three) times daily with meals. [name], MD  Active   ?nitroGLYCERIN (NITROSTAT) 0.4 MG SL tablet [id number]  Place 1 tablet (0.4 mg total) under the tongue every 5 (five) minutes x 3 doses as needed for chest pain. [name], MD  Active Self, Child  ?Med List Note Payton Doughty, CPhT 08/06/18 2010): Dialysis Tuesday, Thursday, Saturday - [place]  ? ?  ?  ? ?  ? ? ? ?SDOH:  (Social Determinants of Health) assessments and interventions performed:  ? ? ? ?Care Plan ? ?Review of patient past medical history, allergies, medications, health status, including review of consultants reports, laboratory and other test data, was performed as part of comprehensive evaluation for care management services.  ? ?Care Plan : [facility] Plan of Care (Adult)  ?Updates made by Valente David, RN since 04/23/2021 12:00 AM  ?  ? ?Problem: Care Coordination and knowledge needs post hospital discharge for stroke   ?Priority: High  ?  ? ?Long-Range Goal: Member and family will adequately manage chronic conditions and decrease risk for recurrent stroke   ?Start Date: 04/23/2021  ?Expected End Date: 10/20/2021  ?Priority: High  ?Note:   ?Current Barriers:  ?Chronic Disease Management support and education needs related to HLD and Stroke  ? ?RNCM Clinical Goal(s):  ?Patient will verbalize understanding of plan for management of HLD and Stroke as evidenced by Member/family verbalizing adequate plan of care ?take all medications exactly as prescribed and will call provider for medication related questions as evidenced by Member/family reporting adherence ?attend all scheduled medical  appointments: PCP and Neurology as evidenced by Member/family report attendance ?continue to work with RN Care Manager to address care management and care coordination needs related to  HLD and Stroke as evidenced by adherence to CM Team Scheduled appointments ?work with home health to attend therapy sessions with PT/OT as evidenced by increased strength and indpendence  through collaboration with Consulting civil engineer, provider, and care team.  ? ?Interventions: ?Inter-disciplinary care team collaboration (see longitudinal plan of care) ?Evaluation of current treatment plan related to  self management and patient's adherence to plan as established by provider ? ? ?Hyperlipidemia Interventions:  (Status:  New goal.) Long Term Goal ?Medication review performed; medication list updated in electronic medical record.  ?Provider established cholesterol goals reviewed ?Provided HLD educational materials ?Reviewed importance of limiting foods high in cholesterol ? ?Stroke:  (Status:New goal.) Long Term Goal ?Reviewed Importance of taking all medications as prescribed ?Advised to report any changes in symptoms or exercise tolerance ?Assessed for signs and symptoms of stroke ?Reviewed referrals to home health ?Reviewed the importance of exercise ?Assessed for cognitive impairment ?Assessed for fall status and safety in the home ? ?Patient  Goals/Self-Care Activities: ?call for medicine refill 2 or 3 days before it runs out ?take all medications exactly as prescribed ?call doctor with any symptoms you believe are related to your medicine ? ?Follow Up Plan:  The patient has been provided with contact information for the care management team and has been advised to call with any health related questions or concerns.  ? ?  ? ? ? ?Plan:  Telephone follow up appointment with care management team member scheduled for:  2 weeks ?The patient has been provided with contact information for the care management team and has been advised to call  with any health related questions or concerns.  ? ?Valente David, RN, MSN, CCM ?[facility] Care Management  ?Community Care Manager ?[phone] ? ? ? ?

## 2021-04-26 ENCOUNTER — Ambulatory Visit (INDEPENDENT_AMBULATORY_CARE_PROVIDER_SITE_OTHER): Payer: Medicare Other

## 2021-04-26 DIAGNOSIS — N186 End stage renal disease: Secondary | ICD-10-CM | POA: Diagnosis not present

## 2021-04-26 DIAGNOSIS — I442 Atrioventricular block, complete: Secondary | ICD-10-CM | POA: Diagnosis not present

## 2021-04-26 DIAGNOSIS — I69398 Other sequelae of cerebral infarction: Secondary | ICD-10-CM | POA: Diagnosis not present

## 2021-04-26 DIAGNOSIS — E1122 Type 2 diabetes mellitus with diabetic chronic kidney disease: Secondary | ICD-10-CM | POA: Diagnosis not present

## 2021-04-26 DIAGNOSIS — I1311 Hypertensive heart and chronic kidney disease without heart failure, with stage 5 chronic kidney disease, or end stage renal disease: Secondary | ICD-10-CM | POA: Diagnosis not present

## 2021-04-26 DIAGNOSIS — R29898 Other symptoms and signs involving the musculoskeletal system: Secondary | ICD-10-CM | POA: Diagnosis not present

## 2021-04-26 DIAGNOSIS — I69328 Other speech and language deficits following cerebral infarction: Secondary | ICD-10-CM | POA: Diagnosis not present

## 2021-04-26 LAB — CUP PACEART REMOTE DEVICE CHECK
Date Time Interrogation Session: 20230331230717
Implantable Pulse Generator Implant Date: 20210412

## 2021-04-28 DIAGNOSIS — I69398 Other sequelae of cerebral infarction: Secondary | ICD-10-CM | POA: Diagnosis not present

## 2021-04-28 DIAGNOSIS — I1311 Hypertensive heart and chronic kidney disease without heart failure, with stage 5 chronic kidney disease, or end stage renal disease: Secondary | ICD-10-CM | POA: Diagnosis not present

## 2021-04-28 DIAGNOSIS — I69328 Other speech and language deficits following cerebral infarction: Secondary | ICD-10-CM | POA: Diagnosis not present

## 2021-04-28 DIAGNOSIS — N186 End stage renal disease: Secondary | ICD-10-CM | POA: Diagnosis not present

## 2021-04-28 DIAGNOSIS — R29898 Other symptoms and signs involving the musculoskeletal system: Secondary | ICD-10-CM | POA: Diagnosis not present

## 2021-04-28 DIAGNOSIS — E1122 Type 2 diabetes mellitus with diabetic chronic kidney disease: Secondary | ICD-10-CM | POA: Diagnosis not present

## 2021-04-30 DIAGNOSIS — I69398 Other sequelae of cerebral infarction: Secondary | ICD-10-CM | POA: Diagnosis not present

## 2021-04-30 DIAGNOSIS — I1311 Hypertensive heart and chronic kidney disease without heart failure, with stage 5 chronic kidney disease, or end stage renal disease: Secondary | ICD-10-CM | POA: Diagnosis not present

## 2021-04-30 DIAGNOSIS — I69328 Other speech and language deficits following cerebral infarction: Secondary | ICD-10-CM | POA: Diagnosis not present

## 2021-04-30 DIAGNOSIS — N186 End stage renal disease: Secondary | ICD-10-CM | POA: Diagnosis not present

## 2021-04-30 DIAGNOSIS — R29898 Other symptoms and signs involving the musculoskeletal system: Secondary | ICD-10-CM | POA: Diagnosis not present

## 2021-04-30 DIAGNOSIS — E1122 Type 2 diabetes mellitus with diabetic chronic kidney disease: Secondary | ICD-10-CM | POA: Diagnosis not present

## 2021-05-03 DIAGNOSIS — I69398 Other sequelae of cerebral infarction: Secondary | ICD-10-CM | POA: Diagnosis not present

## 2021-05-03 DIAGNOSIS — E1122 Type 2 diabetes mellitus with diabetic chronic kidney disease: Secondary | ICD-10-CM | POA: Diagnosis not present

## 2021-05-03 DIAGNOSIS — R29898 Other symptoms and signs involving the musculoskeletal system: Secondary | ICD-10-CM | POA: Diagnosis not present

## 2021-05-03 DIAGNOSIS — N186 End stage renal disease: Secondary | ICD-10-CM | POA: Diagnosis not present

## 2021-05-03 DIAGNOSIS — I1311 Hypertensive heart and chronic kidney disease without heart failure, with stage 5 chronic kidney disease, or end stage renal disease: Secondary | ICD-10-CM | POA: Diagnosis not present

## 2021-05-03 DIAGNOSIS — I69328 Other speech and language deficits following cerebral infarction: Secondary | ICD-10-CM | POA: Diagnosis not present

## 2021-05-05 DIAGNOSIS — I1311 Hypertensive heart and chronic kidney disease without heart failure, with stage 5 chronic kidney disease, or end stage renal disease: Secondary | ICD-10-CM | POA: Diagnosis not present

## 2021-05-05 DIAGNOSIS — E1122 Type 2 diabetes mellitus with diabetic chronic kidney disease: Secondary | ICD-10-CM | POA: Diagnosis not present

## 2021-05-05 DIAGNOSIS — R29898 Other symptoms and signs involving the musculoskeletal system: Secondary | ICD-10-CM | POA: Diagnosis not present

## 2021-05-05 DIAGNOSIS — I69328 Other speech and language deficits following cerebral infarction: Secondary | ICD-10-CM | POA: Diagnosis not present

## 2021-05-05 DIAGNOSIS — N186 End stage renal disease: Secondary | ICD-10-CM | POA: Diagnosis not present

## 2021-05-05 DIAGNOSIS — I69398 Other sequelae of cerebral infarction: Secondary | ICD-10-CM | POA: Diagnosis not present

## 2021-05-07 ENCOUNTER — Other Ambulatory Visit: Payer: Self-pay | Admitting: *Deleted

## 2021-05-07 DIAGNOSIS — E1122 Type 2 diabetes mellitus with diabetic chronic kidney disease: Secondary | ICD-10-CM | POA: Diagnosis not present

## 2021-05-07 DIAGNOSIS — N186 End stage renal disease: Secondary | ICD-10-CM | POA: Diagnosis not present

## 2021-05-07 DIAGNOSIS — I69398 Other sequelae of cerebral infarction: Secondary | ICD-10-CM | POA: Diagnosis not present

## 2021-05-07 DIAGNOSIS — R29898 Other symptoms and signs involving the musculoskeletal system: Secondary | ICD-10-CM | POA: Diagnosis not present

## 2021-05-07 DIAGNOSIS — I69328 Other speech and language deficits following cerebral infarction: Secondary | ICD-10-CM | POA: Diagnosis not present

## 2021-05-07 DIAGNOSIS — I1311 Hypertensive heart and chronic kidney disease without heart failure, with stage 5 chronic kidney disease, or end stage renal disease: Secondary | ICD-10-CM | POA: Diagnosis not present

## 2021-05-07 NOTE — Patient Outreach (Signed)
?[facility]) Care Management ?Telephonic RN Care Manager Note ? ? ?05/07/2021 ?Name:  Nicholas [name] MRN:  [id number] DOB:  Feb 05, 1930 ? ?Summary: ?Outgoing call placed to member, successful to wife.  Denies any urgent concerns, encouraged to contact this care manager with questions.   ? ? ?Subjective: ?Nicholas [name] is an 88 y.o. year old male who is a primary patient of Windy Fast, MD. The care management team was consulted for assistance with care management and/or care coordination needs.   ? ?Telephonic RN Care Manager completed Telephone Visit today. ? ?Objective:  ? ?Medications Reviewed Today   ? ? Reviewed by Valente David, RN (Registered Nurse) on 04/23/21 at 23  Med List Status: <None>  ? ?Medication Order Taking? Sig Documenting Provider Last Dose Status Informant  ?acetaminophen (TYLENOL) 500 MG tablet [id number]  Take 500 mg by mouth 3 (three) times daily as needed for moderate pain. [provider]  Active Self, Child  ?aspirin EC 81 MG EC tablet [id number]  Take 1 tablet (81 mg total) by mouth daily. Swallow whole. [name], MD  Active   ?atorvastatin (LIPITOR) 80 MG tablet [id number]  Take 80 mg by mouth at bedtime. [provider]  Active Self, Child  ?cinacalcet (SENSIPAR) 30 MG tablet [id number]  Take by mouth 3 (three) times a week. Harrell Lark, Sat [provider]  Active Self, Child  ?clopidogrel (PLAVIX) 75 MG tablet [id number]  Take 1 tablet (75 mg total) by mouth daily with breakfast. Restart after 5 days Domenic Polite, MD  Active Self, Child  ?diphenhydrAMINE (BENADRYL) 25 mg capsule [id number]  Take 1 capsule by mouth as needed. [provider]  Active Self, Child  ?Fluticasone Propionate,sensor, (ARMONAIR DIGIHALER) 232 MCG/ACT AEPB [id number]  Inhale 1 puff into the lungs 2 (two) times daily. [provider]  Active Self, Child  ?insulin NPH-regular Human (70-30) 100 UNIT/ML injection [id number]  Inject 10 Units into the  skin daily with breakfast. Takes 20 units in the morning if blood sugar is over 140 Domenic Polite, MD  Active Self, Child  ?midodrine (PROAMATINE) 5 MG tablet [id number]  Take 1 tablet (5 mg total) by mouth 3 (three) times daily with meals. [name], MD  Active   ?nitroGLYCERIN (NITROSTAT) 0.4 MG SL tablet [id number]  Place 1 tablet (0.4 mg total) under the tongue every 5 (five) minutes x 3 doses as needed for chest pain. [name], MD  Active Self, Child  ?Med List Note Payton Doughty, CPhT 08/06/18 2010): Dialysis Tuesday, Thursday, Saturday - [place]  ? ?  ?  ? ?  ? ? ? ?SDOH:  (Social Determinants of Health) assessments and interventions performed:  ? ? ? ?Care Plan ? ?Review of patient past medical history, allergies, medications, health status, including review of consultants reports, laboratory and other test data, was performed as part of comprehensive evaluation for care management services.  ? ?Care Plan : [facility] Plan of Care (Adult)  ?Updates made by Valente David, RN since 05/07/2021 12:00 AM  ?  ? ?Problem: Care Coordination and knowledge needs post hospital discharge for stroke   ?Priority: High  ?  ? ?Long-Range Goal: Member and family will adequately manage chronic conditions and decrease risk for recurrent stroke   ?Start Date: 04/23/2021  ?Expected End Date: 10/20/2021  ?This Visit's Progress: On track  ?Priority: High  ?Note:   ?Current Barriers:  ?Chronic Disease Management support and education needs related to HLD and Stroke  ? ?RNCM Clinical  Goal(s):  ?Patient will verbalize understanding of plan for management of HLD and Stroke as evidenced by Member/family verbalizing adequate plan of care ?take all medications exactly as prescribed and will call provider for medication related questions as evidenced by Member/family reporting adherence ?attend all scheduled medical appointments: PCP and Neurology as evidenced by Member/family report attendance ?continue to work with  RN Care Manager to address care management and care coordination needs related to  HLD and Stroke as evidenced by adherence to CM Team Scheduled appointments ?work with home health to attend therapy sessions with PT/OT as evidenced by increased strength and indpendence  through collaboration with Consulting civil engineer, provider, and care team.  ? ?Interventions: ?Inter-disciplinary care team collaboration (see longitudinal plan of care) ?Evaluation of current treatment plan related to  self management and patient's adherence to plan as established by provider ? ? ?Hyperlipidemia Interventions:  (Status:  Goal on track:  Yes.) Long Term Goal ?Medication review performed; medication list updated in electronic medical record.  ?Provider established cholesterol goals reviewed ?Provided HLD educational materials ?Reviewed importance of limiting foods high in cholesterol ? ?Stroke:  (Status:Goal on track:  Yes.) Long Term Goal ?Reviewed Importance of taking all medications as prescribed ?Advised to report any changes in symptoms or exercise tolerance ?Assessed for signs and symptoms of stroke ?Reviewed referrals to home health ?Reviewed the importance of exercise ?Assessed for cognitive impairment ?Assessed for fall status and safety in the home ? ?Patient Goals/Self-Care Activities: ?call for medicine refill 2 or 3 days before it runs out ?take all medications exactly as prescribed ?call doctor with any symptoms you believe are related to your medicine ? ?Follow Up Plan:  The patient has been provided with contact information for the care management team and has been advised to call with any health related questions or concerns.  ? ? ?Update 4/14 - Spoke with wife, she report member is improving.  He has been gaining strength and using his right side more.  Walking is more stable with walker and he is able to write more legibly now.  He remains active with home health PT/OT through [facility].  Was seen by PCP on 4/7 at the [place],  has also seen neurology at the [facility] since last outreach on 3/31.  Blood pressure has remained stable with Midodrine, unsure what it was today as home health assessed it earlier (wasn't told what it was but that it was "good.")  He has not experienced any signs/symptoms of recurrent stroke, denies other complaints. ? ? ? ?  ? ? ? ?Plan:  Telephone follow up appointment with care management team member scheduled for:  1 month ?The patient has been provided with contact information for the care management team and has been advised to call with any health related questions or concerns.  ? ?Valente David, RN, MSN, CCM ?[facility] Care Management  ?Community Care Manager ?[phone] ? ? ? ?

## 2021-05-10 DIAGNOSIS — I1311 Hypertensive heart and chronic kidney disease without heart failure, with stage 5 chronic kidney disease, or end stage renal disease: Secondary | ICD-10-CM | POA: Diagnosis not present

## 2021-05-10 DIAGNOSIS — I69398 Other sequelae of cerebral infarction: Secondary | ICD-10-CM | POA: Diagnosis not present

## 2021-05-10 DIAGNOSIS — I69328 Other speech and language deficits following cerebral infarction: Secondary | ICD-10-CM | POA: Diagnosis not present

## 2021-05-10 DIAGNOSIS — E1122 Type 2 diabetes mellitus with diabetic chronic kidney disease: Secondary | ICD-10-CM | POA: Diagnosis not present

## 2021-05-10 DIAGNOSIS — R29898 Other symptoms and signs involving the musculoskeletal system: Secondary | ICD-10-CM | POA: Diagnosis not present

## 2021-05-10 DIAGNOSIS — N186 End stage renal disease: Secondary | ICD-10-CM | POA: Diagnosis not present

## 2021-05-10 NOTE — Progress Notes (Signed)
[facility] Summary Report / Loop Recorder 

## 2021-05-12 ENCOUNTER — Observation Stay (HOSPITAL_BASED_OUTPATIENT_CLINIC_OR_DEPARTMENT_OTHER)
Admission: EM | Admit: 2021-05-12 | Discharge: 2021-05-14 | Disposition: A | Discharge status: Home / Self Care | Payer: Medicare Other | Source: Home / Self Care | Attending: Emergency Medicine | Admitting: Emergency Medicine

## 2021-05-12 ENCOUNTER — Encounter (HOSPITAL_BASED_OUTPATIENT_CLINIC_OR_DEPARTMENT_OTHER): Payer: Self-pay | Admitting: Emergency Medicine

## 2021-05-12 ENCOUNTER — Other Ambulatory Visit: Payer: Self-pay

## 2021-05-12 DIAGNOSIS — I63512 Cerebral infarction due to unspecified occlusion or stenosis of left middle cerebral artery: Secondary | ICD-10-CM | POA: Diagnosis not present

## 2021-05-12 DIAGNOSIS — R29898 Other symptoms and signs involving the musculoskeletal system: Secondary | ICD-10-CM | POA: Diagnosis not present

## 2021-05-12 DIAGNOSIS — R55 Syncope and collapse: Secondary | ICD-10-CM | POA: Diagnosis not present

## 2021-05-12 DIAGNOSIS — D62 Acute posthemorrhagic anemia: Secondary | ICD-10-CM | POA: Diagnosis not present

## 2021-05-12 DIAGNOSIS — Z794 Long term (current) use of insulin: Secondary | ICD-10-CM

## 2021-05-12 DIAGNOSIS — Z992 Dependence on renal dialysis: Secondary | ICD-10-CM

## 2021-05-12 DIAGNOSIS — I120 Hypertensive chronic kidney disease with stage 5 chronic kidney disease or end stage renal disease: Secondary | ICD-10-CM | POA: Diagnosis not present

## 2021-05-12 DIAGNOSIS — K5791 Diverticulosis of intestine, part unspecified, without perforation or abscess with bleeding: Secondary | ICD-10-CM | POA: Diagnosis not present

## 2021-05-12 DIAGNOSIS — I639 Cerebral infarction, unspecified: Secondary | ICD-10-CM | POA: Diagnosis present

## 2021-05-12 DIAGNOSIS — E1129 Type 2 diabetes mellitus with other diabetic kidney complication: Secondary | ICD-10-CM | POA: Diagnosis not present

## 2021-05-12 DIAGNOSIS — I2510 Atherosclerotic heart disease of native coronary artery without angina pectoris: Secondary | ICD-10-CM | POA: Diagnosis present

## 2021-05-12 DIAGNOSIS — M898X9 Other specified disorders of bone, unspecified site: Secondary | ICD-10-CM | POA: Diagnosis not present

## 2021-05-12 DIAGNOSIS — Z515 Encounter for palliative care: Secondary | ICD-10-CM | POA: Diagnosis not present

## 2021-05-12 DIAGNOSIS — Z7902 Long term (current) use of antithrombotics/antiplatelets: Secondary | ICD-10-CM | POA: Diagnosis not present

## 2021-05-12 DIAGNOSIS — K922 Gastrointestinal hemorrhage, unspecified: Secondary | ICD-10-CM | POA: Diagnosis present

## 2021-05-12 DIAGNOSIS — I959 Hypotension, unspecified: Secondary | ICD-10-CM | POA: Diagnosis not present

## 2021-05-12 DIAGNOSIS — Z79899 Other long term (current) drug therapy: Secondary | ICD-10-CM | POA: Diagnosis not present

## 2021-05-12 DIAGNOSIS — I1311 Hypertensive heart and chronic kidney disease without heart failure, with stage 5 chronic kidney disease, or end stage renal disease: Secondary | ICD-10-CM | POA: Diagnosis not present

## 2021-05-12 DIAGNOSIS — K219 Gastro-esophageal reflux disease without esophagitis: Secondary | ICD-10-CM | POA: Diagnosis present

## 2021-05-12 DIAGNOSIS — Z87891 Personal history of nicotine dependence: Secondary | ICD-10-CM | POA: Diagnosis not present

## 2021-05-12 DIAGNOSIS — E7800 Pure hypercholesterolemia, unspecified: Secondary | ICD-10-CM | POA: Diagnosis present

## 2021-05-12 DIAGNOSIS — K5730 Diverticulosis of large intestine without perforation or abscess without bleeding: Secondary | ICD-10-CM | POA: Diagnosis not present

## 2021-05-12 DIAGNOSIS — L409 Psoriasis, unspecified: Secondary | ICD-10-CM | POA: Diagnosis present

## 2021-05-12 DIAGNOSIS — Z888 Allergy status to other drugs, medicaments and biological substances status: Secondary | ICD-10-CM | POA: Diagnosis not present

## 2021-05-12 DIAGNOSIS — E11649 Type 2 diabetes mellitus with hypoglycemia without coma: Secondary | ICD-10-CM | POA: Diagnosis not present

## 2021-05-12 DIAGNOSIS — N186 End stage renal disease: Secondary | ICD-10-CM

## 2021-05-12 DIAGNOSIS — I69398 Other sequelae of cerebral infarction: Secondary | ICD-10-CM | POA: Diagnosis not present

## 2021-05-12 DIAGNOSIS — R000 Tachycardia, unspecified: Secondary | ICD-10-CM | POA: Diagnosis not present

## 2021-05-12 DIAGNOSIS — E1122 Type 2 diabetes mellitus with diabetic chronic kidney disease: Secondary | ICD-10-CM

## 2021-05-12 DIAGNOSIS — D500 Iron deficiency anemia secondary to blood loss (chronic): Secondary | ICD-10-CM | POA: Diagnosis not present

## 2021-05-12 DIAGNOSIS — D631 Anemia in chronic kidney disease: Secondary | ICD-10-CM | POA: Diagnosis not present

## 2021-05-12 DIAGNOSIS — Z8673 Personal history of transient ischemic attack (TIA), and cerebral infarction without residual deficits: Secondary | ICD-10-CM | POA: Diagnosis not present

## 2021-05-12 DIAGNOSIS — I69328 Other speech and language deficits following cerebral infarction: Secondary | ICD-10-CM | POA: Diagnosis not present

## 2021-05-12 DIAGNOSIS — R001 Bradycardia, unspecified: Secondary | ICD-10-CM | POA: Diagnosis not present

## 2021-05-12 DIAGNOSIS — K921 Melena: Secondary | ICD-10-CM | POA: Diagnosis not present

## 2021-05-12 DIAGNOSIS — I25118 Atherosclerotic heart disease of native coronary artery with other forms of angina pectoris: Secondary | ICD-10-CM | POA: Diagnosis not present

## 2021-05-12 DIAGNOSIS — Z7189 Other specified counseling: Secondary | ICD-10-CM | POA: Diagnosis not present

## 2021-05-12 DIAGNOSIS — R0689 Other abnormalities of breathing: Secondary | ICD-10-CM | POA: Diagnosis not present

## 2021-05-12 DIAGNOSIS — J9611 Chronic respiratory failure with hypoxia: Secondary | ICD-10-CM | POA: Diagnosis not present

## 2021-05-12 DIAGNOSIS — Z8719 Personal history of other diseases of the digestive system: Secondary | ICD-10-CM | POA: Diagnosis not present

## 2021-05-12 DIAGNOSIS — Z955 Presence of coronary angioplasty implant and graft: Secondary | ICD-10-CM | POA: Diagnosis not present

## 2021-05-12 DIAGNOSIS — R739 Hyperglycemia, unspecified: Secondary | ICD-10-CM | POA: Diagnosis not present

## 2021-05-12 DIAGNOSIS — Z7982 Long term (current) use of aspirin: Secondary | ICD-10-CM | POA: Diagnosis not present

## 2021-05-12 DIAGNOSIS — N250 Renal osteodystrophy: Secondary | ICD-10-CM | POA: Diagnosis not present

## 2021-05-12 LAB — COMPREHENSIVE METABOLIC PANEL
ALT: 13 U/L (ref 0–44)
AST: 14 U/L — ABNORMAL LOW (ref 15–41)
Albumin: 2.9 g/dL — ABNORMAL LOW (ref 3.5–5.0)
Alkaline Phosphatase: 91 U/L (ref 38–126)
Anion gap: 11 (ref 5–15)
BUN: 48 mg/dL — ABNORMAL HIGH (ref 8–23)
CO2: 29 mmol/L (ref 22–32)
Calcium: 8.3 mg/dL — ABNORMAL LOW (ref 8.9–10.3)
Chloride: 99 mmol/L (ref 98–111)
Creatinine, Ser: 7.69 mg/dL — ABNORMAL HIGH (ref 0.61–1.24)
GFR, Estimated: 6 mL/min — ABNORMAL LOW (ref >60)
Glucose, Bld: 219 mg/dL — ABNORMAL HIGH (ref 70–99)
Potassium: 4.7 mmol/L (ref 3.5–5.1)
Sodium: 139 mmol/L (ref 135–145)
Total Bilirubin: 0.3 mg/dL (ref 0.3–1.2)
Total Protein: 6.8 g/dL (ref 6.5–8.1)

## 2021-05-12 MED ORDER — PANTOPRAZOLE SODIUM 40 MG IV SOLR
40.0000 mg | Freq: Once | INTRAVENOUS | Status: AC
  Administered 2021-05-12: 40 mg via INTRAVENOUS
  Filled 2021-05-12: qty 10

## 2021-05-12 NOTE — Progress Notes (Signed)
Patient: Nicholas [name], Nicholas [name]  ?DOB: 07/22/1930 ?XIP:[id number] ?Transferring facility: [employer] [facility] ?Requesting provider: Dr. Deno Etienne (EDP at [facility]) ?Reason for transfer: admission for further evaluation and management of suspected acute upper GI bleed ? ? ?88 year old male with medical history notable for ESRD on HD, ischemic CVA, who presented to [facility] ED on 05/12/21 complaining of melena.  ? ?Patient with 2 episodes of melena over the last day, with first such episode occurring in the PM of 05/11/21 followed by second episode of melena this afternoon, with the second such episode prompting the patient to present to [place] ED.  Otherwise, he is reportedly without any additional complaints, including no chest pain or shortness of breath.  In the setting of a history of prior ischemic CVA, the patient is on aspirin/Plavix.  ? ?Vital signs reportedly stable, with HR in the 90's and BP's running in the 100's/60's, which is reportedly consistent with the patient's baseline blood pressure in the setting of end-stage renal disease. ? ?Labs were notable for hemoglobin 6.6, compared to most recent prior value of 8.1 when checked 3 weeks ago, with hemoglobin of approximately 8 reportedly consistent with the patient's baseline.  ? ?EDP discussed patient's case with on-call [facility], Dr. Paulita Fujita, Who will formally consult and see the patient at [facility]. In the meantime, the patient has received IV Protonix and is NPO. ? ?Blood not available for transfusion at the med center. Should patient have an ensuing episode of melena while at [place] would then arrange for ED to ED transfer to expedite the transfer process.  ? ?Subsequently, I accepted this patient for transfer for observation to a PCU bed at [facility] for further work-up and management of suspected presenting acute upper gastrointestinal bleeding complicated by acute on chronic anemia.  ? ? ? ?Check [email or web address] for on-call coverage. ?  ?Nursing staff,  Please call [place] number on Amion as soon as patient's arrival, so appropriate admitting provider can evaluate the pt. ? ? ? ? ?Babs Bertin, DO ?Hospitalist ? ?

## 2021-05-12 NOTE — ED Triage Notes (Signed)
Pt is here from home with c/o bloody stools times 2 in the last 24 hrs hx of diverticulitis , also dialysis pt  ?

## 2021-05-12 NOTE — ED Provider Notes (Addendum)
Bed ?[facility] ?Provider Note ? ? ?CSN: [id number] ?Arrival date & time: 05/12/21  2001 ? ?  ? ?History ? ?Chief Complaint  ?Patient presents with  ? GI Bleeding  ? ? ?Nicholas [name] is a 88 y.o. male. ? ?88 yo M with a chief complaints of gross melena.  The patient had a bowel movement last night that was dark with maroon-colored blood and then had a much larger bowel movement today that was the same.  He denies any abdominal pain denies fevers denies feeling weak or lightheaded.  He gets dialysis Tuesday Thursday and Saturday and denies any missed sessions. ? ? ? ?  ? ?Home Medications ?Prior to Admission medications   ?Medication Sig Start Date End Date Taking? Authorizing Provider  ?acetaminophen (TYLENOL) 500 MG tablet Take 500 mg by mouth 3 (three) times daily as needed for moderate pain.    [provider]  ?aspirin EC 81 MG EC tablet Take 1 tablet (81 mg total) by mouth daily. Swallow whole. 04/18/21 07/17/21  [name], MD  ?atorvastatin (LIPITOR) 80 MG tablet Take 80 mg by mouth at bedtime.    [provider]  ?cinacalcet (SENSIPAR) 30 MG tablet Take by mouth 3 (three) times a week. Harrell Lark, Sat 09/18/20   [provider]  ?clopidogrel (PLAVIX) 75 MG tablet Take 1 tablet (75 mg total) by mouth daily with breakfast. Restart after 5 days 09/18/20   Domenic Polite, MD  ?diphenhydrAMINE (BENADRYL) 25 mg capsule Take 1 capsule by mouth as needed. 04/06/21   [provider]  ?Fluticasone Propionate,sensor, (ARMONAIR DIGIHALER) 232 MCG/ACT AEPB Inhale 1 puff into the lungs 2 (two) times daily.    [provider]  ?insulin NPH-regular Human (70-30) 100 UNIT/ML injection Inject 10 Units into the skin daily with breakfast. Takes 20 units in the morning if blood sugar is over 140 09/13/20   Domenic Polite, MD  ?midodrine (PROAMATINE) 5 MG tablet Take 1 tablet (5 mg total) by mouth 3 (three) times daily with meals. 04/17/21   [name],  MD  ?nitroGLYCERIN (NITROSTAT) 0.4 MG SL tablet Place 1 tablet (0.4 mg total) under the tongue every 5 (five) minutes x 3 doses as needed for chest pain. 08/10/18   [name], MD  ?   ? ?Allergies    ?Hydrochlorothiazide   ? ?Review of Systems   ?Review of Systems ? ?Physical Exam ?Updated Vital Signs ?BP (!) 103/54   Pulse 93   Temp 98.6 ?F (37 ?C)   Resp 20   Ht '5\' 8"'$  (1.727 m)   Wt 70.8 kg   SpO2 96%   BMI 23.72 kg/m?  ?Physical Exam ?Vitals and nursing note reviewed.  ?Constitutional:   ?   Appearance: He is well-developed.  ?HENT:  ?   Head: Normocephalic and atraumatic.  ?Eyes:  ?   Pupils: Pupils are equal, round, and reactive to light.  ?Neck:  ?   Vascular: No JVD.  ?Cardiovascular:  ?   Rate and Rhythm: Normal rate and regular rhythm.  ?   Heart sounds: No murmur heard. ?  No friction rub. No gallop.  ?Pulmonary:  ?   Effort: No respiratory distress.  ?   Breath sounds: No wheezing.  ?Abdominal:  ?   General: There is no distension.  ?   Tenderness: There is no abdominal tenderness. There is no guarding or rebound.  ?   Comments: Benign abdominal exam  ?Musculoskeletal:     ?  General: Normal range of motion.  ?   Cervical back: Normal range of motion and neck supple.  ?   Comments: Left AV fistula with palpable thrill  ?Skin: ?   Coloration: Skin is not pale.  ?   Findings: No rash.  ?Neurological:  ?   Mental Status: He is alert and oriented to person, place, and time.  ?Psychiatric:     ?   Behavior: Behavior normal.  ? ? ?ED Results / Procedures / Treatments   ?Labs ?(all labs ordered are listed, but only abnormal results are displayed) ?Labs Reviewed  ?CBC WITH DIFFERENTIAL/PLATELET  ?COMPREHENSIVE METABOLIC PANEL  ? ? ?EKG ?None ? ?Radiology ?No results found. ? ?Procedures ?Procedures  ? ? ?Medications Ordered in ED ?Medications  ?pantoprazole (PROTONIX) injection 40 mg (40 mg Intravenous Given 05/12/21 2108)  ? ? ?ED Course/ Medical Decision Making/ A&P ?  ?                         ?Medical Decision Making ?Amount and/or Complexity of Data Reviewed ?Labs: ordered. ? ?Risk ?Prescription drug management. ?Decision regarding hospitalization. ? ? ?88 yo M with a significant past medical history of end-stage renal disease on dialysis every Tuesday Thursday and Saturday and prior stroke on aspirin and Plavix with a chief complaints of gross melena.  This has been going on since last night.  We will give a dose of Protonix.  Check blood counts. ? ?Hemoglobin 6.6 when compared to 8.1 3 weeks ago.  Will discuss with GI. ? ?I discussed case with Dr. Paulita Fujita, GI, agreed with current plan.  Will discuss with hospitalist for admission at [facility]. ? ?I discussed the case with the hospitalist to put in a bed request.  I inquired with bed management when they anticipated that the patient would get a bed and they thought likely tomorrow.  Do not feel that this was in the patient's best interest with known anemia that would require transfusion and was having at diagnosis of an upper GI bleed.  I discussed this with Dr. Ronnald Nian who accepts the patient in ED to ED transfer. ? ?CRITICAL CARE ?Performed by: Cecilio Asper ? ? ?Total critical care time: 35 minutes ? ?Critical care time was exclusive of separately billable procedures and treating other patients. ? ?Critical care was necessary to treat or prevent imminent or life-threatening deterioration. ? ?Critical care was time spent personally by me on the following activities: development of treatment plan with patient and/or surrogate as well as nursing, discussions with consultants, evaluation of patient's response to treatment, examination of patient, obtaining history from patient or surrogate, ordering and performing treatments and interventions, ordering and review of laboratory studies, ordering and review of radiographic studies, pulse oximetry and re-evaluation of patient's condition. ? ?The patients results and plan were reviewed and discussed.    ?Any x-rays performed were independently reviewed by myself.  ? ?Differential diagnosis were considered with the presenting HPI. ? ?Medications  ?pantoprazole (PROTONIX) injection 40 mg (40 mg Intravenous Given 05/12/21 2108)  ? ? ?Vitals:  ? 05/12/21 2014 05/12/21 2015 05/12/21 2100 05/12/21 2200  ?BP: (!) 103/54   (!) 101/58  ?Pulse: 93  93   ?Resp: 20     ?Temp: 98.6 ?F (37 ?C)     ?SpO2: 96%  99%   ?Weight:  70.8 kg    ?Height:  '5\' 8"'$  (1.727 m)    ? ? ?Final diagnoses:  ?Upper GI bleed  ? ? ?  Admission/ observation were discussed with the admitting physician, patient and/or family and they are comfortable with the plan.  ? ? ? ? ? ? ? ?Final Clinical Impression(s) / ED Diagnoses ?Final diagnoses:  ?None  ? ? ?Rx / DC Orders ?ED Discharge Orders   ? ? None  ? ?  ? ? ?  ?Deno Etienne, DO ?05/12/21 2211 ? ?  ?Deno Etienne, DO ?05/12/21 2250 ? ?

## 2021-05-13 ENCOUNTER — Encounter (HOSPITAL_COMMUNITY): Payer: Self-pay | Admitting: Family Medicine

## 2021-05-13 DIAGNOSIS — N186 End stage renal disease: Secondary | ICD-10-CM | POA: Diagnosis not present

## 2021-05-13 DIAGNOSIS — K922 Gastrointestinal hemorrhage, unspecified: Secondary | ICD-10-CM | POA: Diagnosis not present

## 2021-05-13 DIAGNOSIS — D631 Anemia in chronic kidney disease: Secondary | ICD-10-CM | POA: Diagnosis not present

## 2021-05-13 DIAGNOSIS — E1129 Type 2 diabetes mellitus with other diabetic kidney complication: Secondary | ICD-10-CM | POA: Diagnosis not present

## 2021-05-13 DIAGNOSIS — N250 Renal osteodystrophy: Secondary | ICD-10-CM | POA: Diagnosis not present

## 2021-05-13 DIAGNOSIS — K921 Melena: Secondary | ICD-10-CM | POA: Diagnosis not present

## 2021-05-13 DIAGNOSIS — I120 Hypertensive chronic kidney disease with stage 5 chronic kidney disease or end stage renal disease: Secondary | ICD-10-CM | POA: Diagnosis not present

## 2021-05-13 DIAGNOSIS — Z794 Long term (current) use of insulin: Secondary | ICD-10-CM | POA: Diagnosis not present

## 2021-05-13 DIAGNOSIS — Z992 Dependence on renal dialysis: Secondary | ICD-10-CM | POA: Diagnosis not present

## 2021-05-13 LAB — CBC WITH DIFFERENTIAL/PLATELET
Abs Immature Granulocytes: 0.04 10*3/uL (ref 0.00–0.07)
Basophils Absolute: 0 10*3/uL (ref 0.0–0.1)
Basophils Relative: 1 %
Eosinophils Absolute: 0.2 10*3/uL (ref 0.0–0.5)
Eosinophils Relative: 3 %
HCT: 20.6 % — ABNORMAL LOW (ref 39.0–52.0)
Hemoglobin: 6.6 g/dL — CL (ref 13.0–17.0)
Immature Granulocytes: 1 %
Lymphocytes Relative: 15 %
Lymphs Abs: 1.4 10*3/uL (ref 0.7–4.0)
MCH: 31.4 pg (ref 26.0–34.0)
MCHC: 32 g/dL (ref 30.0–36.0)
MCV: 98.1 fL (ref 80.0–100.0)
Monocytes Absolute: 0.7 10*3/uL (ref 0.1–1.0)
Monocytes Relative: 8 %
Neutro Abs: 6.5 10*3/uL (ref 1.7–7.7)
Neutrophils Relative %: 72 %
Platelets: 307 10*3/uL (ref 150–400)
RBC: 2.1 MIL/uL — ABNORMAL LOW (ref 4.22–5.81)
RDW: 15.2 % (ref 11.5–15.5)
WBC: 8.9 10*3/uL (ref 4.0–10.5)
nRBC: 0 % (ref 0.0–0.2)

## 2021-05-13 LAB — HEPATITIS B SURFACE ANTIGEN: Hepatitis B Surface Ag: NONREACTIVE

## 2021-05-13 LAB — CBG MONITORING, ED
Glucose-Capillary: 171 mg/dL — ABNORMAL HIGH (ref 70–99)
Glucose-Capillary: 137 mg/dL — ABNORMAL HIGH (ref 70–99)
Glucose-Capillary: 126 mg/dL — ABNORMAL HIGH (ref 70–99)

## 2021-05-13 LAB — HEMATOCRIT
HCT: 20.1 % — ABNORMAL LOW (ref 39.0–52.0)
HCT: 24 % — ABNORMAL LOW (ref 39.0–52.0)

## 2021-05-13 LAB — RETICULOCYTES
Immature Retic Fract: 13.9 % (ref 2.3–15.9)
RBC.: 2.39 MIL/uL — ABNORMAL LOW (ref 4.22–5.81)
Retic Count, Absolute: 30.1 10*3/uL (ref 19.0–186.0)
Retic Ct Pct: 1.3 % (ref 0.4–3.1)

## 2021-05-13 LAB — CBC
HCT: 22.9 % — ABNORMAL LOW (ref 39.0–52.0)
Hemoglobin: 7.5 g/dL — ABNORMAL LOW (ref 13.0–17.0)
MCH: 31 pg (ref 26.0–34.0)
MCHC: 32.8 g/dL (ref 30.0–36.0)
MCV: 94.6 fL (ref 80.0–100.0)
Platelets: 257 10*3/uL (ref 150–400)
RBC: 2.42 MIL/uL — ABNORMAL LOW (ref 4.22–5.81)
RDW: 16.3 % — ABNORMAL HIGH (ref 11.5–15.5)
WBC: 6.9 10*3/uL (ref 4.0–10.5)
nRBC: 0 % (ref 0.0–0.2)

## 2021-05-13 LAB — FERRITIN: Ferritin: 1064 ng/mL — ABNORMAL HIGH (ref 24–336)

## 2021-05-13 LAB — IRON AND TIBC
Iron: 58 ug/dL (ref 45–182)
Saturation Ratios: 25 % (ref 17.9–39.5)
TIBC: 237 ug/dL — ABNORMAL LOW (ref 250–450)
UIBC: 179 ug/dL

## 2021-05-13 LAB — HEMOGLOBIN
Hemoglobin: 6.5 g/dL — CL (ref 13.0–17.0)
Hemoglobin: 7.6 g/dL — ABNORMAL LOW (ref 13.0–17.0)

## 2021-05-13 LAB — GLUCOSE, CAPILLARY
Glucose-Capillary: 155 mg/dL — ABNORMAL HIGH (ref 70–99)
Glucose-Capillary: 202 mg/dL — ABNORMAL HIGH (ref 70–99)

## 2021-05-13 LAB — VITAMIN B12: Vitamin B-12: 285 pg/mL (ref 180–914)

## 2021-05-13 LAB — FOLATE: Folate: 7.5 ng/mL (ref >5.9)

## 2021-05-13 LAB — PHOSPHORUS: Phosphorus: 5.6 mg/dL — ABNORMAL HIGH (ref 2.5–4.6)

## 2021-05-13 LAB — PREPARE RBC (CROSSMATCH)

## 2021-05-13 LAB — HEPATITIS B SURFACE ANTIBODY,QUALITATIVE: Hep B S Ab: REACTIVE — AB

## 2021-05-13 MED ORDER — INSULIN ASPART 100 UNIT/ML IJ SOLN
0.0000 [IU] | INTRAMUSCULAR | Status: DC
  Administered 2021-05-13: 1 [IU] via SUBCUTANEOUS
  Administered 2021-05-13: 2 [IU] via SUBCUTANEOUS
  Administered 2021-05-13: 1 [IU] via SUBCUTANEOUS
  Administered 2021-05-14: 2 [IU] via SUBCUTANEOUS

## 2021-05-13 MED ORDER — PANTOPRAZOLE SODIUM 40 MG PO TBEC
40.0000 mg | DELAYED_RELEASE_TABLET | Freq: Every day | ORAL | Status: DC
  Administered 2021-05-13 – 2021-05-14 (×2): 40 mg via ORAL
  Filled 2021-05-13 (×2): qty 1

## 2021-05-13 MED ORDER — ACETAMINOPHEN 500 MG PO TABS
1000.0000 mg | ORAL_TABLET | Freq: Once | ORAL | Status: AC
  Administered 2021-05-13: 1000 mg via ORAL
  Filled 2021-05-13: qty 2

## 2021-05-13 MED ORDER — SODIUM CHLORIDE 0.9% FLUSH
3.0000 mL | Freq: Two times a day (BID) | INTRAVENOUS | Status: DC
  Administered 2021-05-13 – 2021-05-14 (×2): 3 mL via INTRAVENOUS

## 2021-05-13 MED ORDER — ACETAMINOPHEN 650 MG RE SUPP: 650.0000 mg | Freq: Four times a day (QID) | RECTAL | Status: DC | PRN

## 2021-05-13 MED ORDER — ONDANSETRON HCL 4 MG/2ML IJ SOLN: 4.0000 mg | Freq: Four times a day (QID) | INTRAMUSCULAR | Status: DC | PRN

## 2021-05-13 MED ORDER — SODIUM CHLORIDE 0.9% IV SOLUTION: Freq: Once | INTRAVENOUS | Status: DC

## 2021-05-13 MED ORDER — PANTOPRAZOLE SODIUM 40 MG IV SOLR
40.0000 mg | Freq: Two times a day (BID) | INTRAVENOUS | Status: DC
  Administered 2021-05-13: 40 mg via INTRAVENOUS
  Filled 2021-05-13: qty 10

## 2021-05-13 MED ORDER — CHLORHEXIDINE GLUCONATE CLOTH 2 % EX PADS
6.0000 | MEDICATED_PAD | Freq: Every day | CUTANEOUS | Status: DC
  Administered 2021-05-14: 6 via TOPICAL

## 2021-05-13 MED ORDER — ACETAMINOPHEN 325 MG PO TABS
650.0000 mg | ORAL_TABLET | Freq: Four times a day (QID) | ORAL | Status: DC | PRN
  Administered 2021-05-13: 650 mg via ORAL
  Filled 2021-05-13: qty 2

## 2021-05-13 MED ORDER — ONDANSETRON HCL 4 MG PO TABS: 4.0000 mg | ORAL_TABLET | Freq: Four times a day (QID) | ORAL | Status: DC | PRN

## 2021-05-13 NOTE — ED Notes (Signed)
PT at bedside.

## 2021-05-13 NOTE — Consult Note (Signed)
Referring Provider: TRH ?Primary Care Physician:  Windy Fast, MD ?Primary Gastroenterologist:  Dr. Michail Sermon ? ?Reason for Consultation: Blood in stool ? ?HPI: Nicholas [name] is a 88 y.o. male medical history significant for ESRD on hemodialysis, type 2 diabetes mellitus, CAD, and ischemic left MCA stroke 1 month ago (now on ASA and plavix), presents for evaluation of blood in stool. ? ?Patient states he saw blood in his stool while going to the bathroom 05/11/2021 and 05/12/2021. In total he reports about 3-5 episodes of rectal bleeding.  He states blood was bright red, moderate amount.  He states his wife thinks the blood was dark red.  Denies abdominal pain, nausea, vomiting, chest pain, lightheadedness, shortness of breath. ? ?Had an episode similar in June 2022 patient was admitted and underwent EGD/colonoscopy.  EGD was normal, diverticulosis on colonoscopy patient was suspected to have diverticular bleed. ? ?Past Medical History:  ?Diagnosis Date  ? Arthritis   ? Chronic kidney disease   ? Diabetes mellitus ([place])   ? Dialysis patient [facility])   ? GERD (gastroesophageal reflux disease)   ? occ  ? Heart murmur   ? Hypercholesteremia   ? Hypertension   ? Psoriasis   ? ? ?Past Surgical History:  ?Procedure Laterality Date  ? CARDIAC CATHETERIZATION  09  ? "some blockage" with collaterals by cath at [facility] ~ 2009; no intervention required; reportedly, no routine cardiology f/u recommended   ? COLONOSCOPY WITH PROPOFOL N/A 07/06/2020  ? Procedure: COLONOSCOPY WITH PROPOFOL;  Surgeon: Ronald Lobo, MD;  Location: [place];  Service: Endoscopy;  Laterality: N/A;  ? CORONARY ATHERECTOMY N/A 08/09/2018  ? Procedure: CORONARY ATHERECTOMY;  Surgeon: Jettie Booze, MD;  Location: [place] [facility];  Service: Cardiovascular;  Laterality: N/A;  ? CORONARY STENT INTERVENTION N/A 08/09/2018  ? Procedure: CORONARY STENT INTERVENTION;  Surgeon: Jettie Booze, MD;  Location: [place] [facility];  Service:  Cardiovascular;  Laterality: N/A;  ? ESOPHAGOGASTRODUODENOSCOPY (EGD) WITH PROPOFOL N/A 07/06/2020  ? Procedure: ESOPHAGOGASTRODUODENOSCOPY (EGD) WITH PROPOFOL;  Surgeon: Ronald Lobo, MD;  Location: [place];  Service: Endoscopy;  Laterality: N/A;  ? INGUINAL HERNIA REPAIR Right 04/13/2012  ? Procedure: HERNIA REPAIR INGUINAL INCARCERATED;  Surgeon: Gayland Curry, MD;  Location: [place];  Service: General;  Laterality: Right;  ? INSERTION OF MESH Right 04/13/2012  ? Procedure: INSERTION OF MESH;  Surgeon: Gayland Curry, MD;  Location: [place];  Service: General;  Laterality: Right;  ? IR THORACENTESIS ASP PLEURAL SPACE W/IMG GUIDE  06/08/2020  ? LEFT HEART CATH N/A 08/09/2018  ? Procedure: Left Heart Cath;  Surgeon: Jettie Booze, MD;  Location: [place] [facility];  Service: Cardiovascular;  Laterality: N/A;  ? LEFT HEART CATH AND CORONARY ANGIOGRAPHY N/A 08/08/2018  ? Procedure: LEFT HEART CATH AND CORONARY ANGIOGRAPHY;  Surgeon: Troy Sine, MD;  Location: [place] [facility];  Service: Cardiovascular;  Laterality: N/A;  ? LEFT HEART CATH AND CORONARY ANGIOGRAPHY N/A 06/05/2020  ? Procedure: LEFT HEART CATH AND CORONARY ANGIOGRAPHY;  Surgeon: Belva Crome, MD;  Location: [place] [facility];  Service: Cardiovascular;  Laterality: N/A;  ? VASECTOMY    ? ? ?Prior to Admission medications   ?Medication Sig Start Date End Date Taking? Authorizing Provider  ?acetaminophen (TYLENOL) 500 MG tablet Take 500 mg by mouth 3 (three) times daily as needed for moderate pain.    [provider]  ?aspirin EC 81 MG EC tablet Take 1 tablet (81 mg total)  by mouth daily. Swallow whole. 04/18/21 07/17/21  [name], MD  ?atorvastatin (LIPITOR) 80 MG tablet Take 80 mg by mouth at bedtime.    [provider]  ?cinacalcet (SENSIPAR) 30 MG tablet Take by mouth 3 (three) times a week. Harrell Lark, Sat 09/18/20   [provider]  ?clopidogrel (PLAVIX) 75 MG tablet Take 1 tablet (75 mg total) by mouth  daily with breakfast. Restart after 5 days 09/18/20   Domenic Polite, MD  ?diphenhydrAMINE (BENADRYL) 25 mg capsule Take 1 capsule by mouth as needed. 04/06/21   [provider]  ?Fluticasone Propionate,sensor, (ARMONAIR DIGIHALER) 232 MCG/ACT AEPB Inhale 1 puff into the lungs 2 (two) times daily.    [provider]  ?insulin NPH-regular Human (70-30) 100 UNIT/ML injection Inject 10 Units into the skin daily with breakfast. Takes 20 units in the morning if blood sugar is over 140 09/13/20   Domenic Polite, MD  ?midodrine (PROAMATINE) 5 MG tablet Take 1 tablet (5 mg total) by mouth 3 (three) times daily with meals. 04/17/21   [name], MD  ?nitroGLYCERIN (NITROSTAT) 0.4 MG SL tablet Place 1 tablet (0.4 mg total) under the tongue every 5 (five) minutes x 3 doses as needed for chest pain. 08/10/18   [name], MD  ? ? ?Scheduled Meds: ? sodium chloride   Intravenous Once  ? insulin aspart  0-6 Units Subcutaneous Q4H  ? pantoprazole (PROTONIX) IV  40 mg Intravenous Q12H  ? sodium chloride flush  3 mL Intravenous Q12H  ? ?Continuous Infusions: ?PRN Meds:.acetaminophen **OR** acetaminophen, ondansetron **OR** ondansetron (ZOFRAN) IV ? ?Allergies as of 05/12/2021 - Review Complete 05/12/2021  ?Allergen Reaction Noted  ? Hydrochlorothiazide Other (See Comments) 02/21/2017  ? ? ?Family History  ?Problem Relation Age of Onset  ? Diabetes Mother   ? Stroke Father   ? Diabetes Sister   ? Diabetes Brother   ? ? ?Social History  ? ?Socioeconomic History  ? Marital status: Married  ?  Spouse name: Not on file  ? Number of children: Not on file  ? Years of education: Not on file  ? Highest education level: Not on file  ?Occupational History  ? Not on file  ?Tobacco Use  ? Smoking status: Former  ?  Packs/day: 1.00  ?  Years: 15.00  ?  Pack years: 15.00  ?  Types: Cigarettes  ?  Quit date: 03/29/1987  ?  Years since quitting: 34.1  ? Smokeless tobacco: Never  ?Substance and Sexual Activity  ? Alcohol  use: No  ? Drug use: No  ? Sexual activity: Not on file  ?Other Topics Concern  ? Not on file  ?Social History Narrative  ? Not on file  ? ?Social Determinants of Health  ? ?Financial Resource Strain: Not on file  ?Food Insecurity: No Food Insecurity  ? Worried About Charity fundraiser in the Last Year: Never true  ? Ran Out of Food in the Last Year: Never true  ?Transportation Needs: No Transportation Needs  ? Lack of Transportation (Medical): No  ? Lack of Transportation (Non-Medical): No  ?Physical Activity: Not on file  ?Stress: Not on file  ?Social Connections: Not on file  ?Intimate Partner Violence: Not on file  ? ? ?Review of Systems: Review of Systems  ?Constitutional:  Negative for chills and fever.  ?HENT:  Negative for hearing loss and tinnitus.   ?Eyes:  Negative for blurred vision and double vision.  ?Respiratory:  Negative for cough and  hemoptysis.   ?Cardiovascular:  Negative for chest pain and palpitations.  ?Gastrointestinal:  Positive for blood in stool. Negative for abdominal pain, constipation, diarrhea, heartburn, melena, nausea and vomiting.  ?Genitourinary:  Negative for dysuria and urgency.  ?Musculoskeletal:  Negative for myalgias and neck pain.  ?Skin:  Negative for itching and rash.  ?Neurological:  Negative for seizures and loss of consciousness.  ?Psychiatric/Behavioral:  Negative for substance abuse. The patient is not nervous/anxious.     ? ?Physical Exam:Physical Exam ?Constitutional:   ?   Appearance: He is obese.  ?HENT:  ?   Head: Normocephalic and atraumatic.  ?   Nose: Nose normal. No congestion.  ?   Mouth/Throat:  ?   Mouth: Mucous membranes are moist.  ?   Pharynx: Oropharynx is clear.  ?Eyes:  ?   Extraocular Movements: Extraocular movements intact.  ?   Comments: Conjunctival pallor  ?Cardiovascular:  ?   Rate and Rhythm: Normal rate and regular rhythm.  ?Pulmonary:  ?   Effort: Pulmonary effort is normal.  ?   Breath sounds: Normal breath sounds.  ?Abdominal:  ?    General: Bowel sounds are normal. There is no distension.  ?   Palpations: Abdomen is soft. There is no mass.  ?   Tenderness: There is no abdominal tenderness. There is no guarding or rebound.  ?   Hernia: No hernia is prese

## 2021-05-13 NOTE — Progress Notes (Addendum)
?PROGRESS NOTE ? ? ? ?Nicholas [name]  KDX:[id number] DOB: December 15, 1930 DOA: 05/12/2021 ?PCP: Windy Fast, MD  ?88 y.o. male with medical history significant for ESRD on hemodialysis, type 2 diabetes mellitus, CAD, and ischemic left MCA stroke 1 month ago, presented to emergency department after 2 bloody stools.   He has been taking aspirin and Plavix since the recent stroke ?-In the ED hemoglobin was 6.6 ? ? ?Subjective: ?-Feels okay, no further episodes today ? ?Assessment and Plan: ? ?Acute lower GI bleeding, likely diverticular ?Acute blood loss anemia ?-Transfused 1 unit of PRBC this am ?-Bleeding likely diverticular, pandiverticulosis noted on colonoscopy 6/22 ?-Gastroenterology consulted ?-Clear liquid diet ?-Gi recommends bleeding scan if recurs ?-Resume aspirin/Plavix tomorrow if no further active bleeding ?-Repeat CBC, check anemia panel ?  ?Hx recent ischemic CVA  ?- Admitted 04/15/21 with ischemic left MCA stroke, reports some subtle difficulty with complex tasks but is improving  ?-Antiplatelet agents on hold today ?  ?ESRD  ?- Last dialyzed 4/18  ?-Nephrology consulted, due for HD today ?  ?Chronic hypoxic respiratory failure/COPD ?- Stable on his usual 2 Lpm supplemental O2  ?  ?CAD  ?- No anginal complaints  ?- Hold antiplatelets pending GI evaluation   ?  ?Type II DM  ?- A1c was 7.6% in March 2023  ?-On insulin 70/30 at baseline, currently on hold, continue sliding scale insulin  ?  ?DVT prophylaxis: SCDs  ?Code Status: Discussed CODE STATUS with the patient again, he wishes to remain full code ?Family Communication: Discussed patient in detail, no family at bedside ?Disposition Plan: Home in 1 to 2 days  ? ?Intensity of care: On account of advanced age, ESRD, acute lower GI bleed with acute blood loss anemia would need to be monitored for at least another 24 hours in the hospital and my clinical opinion ? ?Consultants: Gastroenterology ? ? ?Procedures:  ? ?Antimicrobials:  ? ? ?Objective: ?Vitals:  ?  05/13/21 0630 05/13/21 0700 05/13/21 0715 05/13/21 0800  ?BP: 114/61 (!) 106/54 (!) 118/57 (!) 115/57  ?Pulse: 73 74 75 76  ?Resp: '16 15 15 14  '$ ?Temp:   97.8 ?F (36.6 ?C)   ?TempSrc:   Oral   ?SpO2: 100% 100% 100% 100%  ?Weight:      ?Height:      ? ? ?Intake/Output Summary (Last 24 hours) at 05/13/2021 0945 ?Last data filed at 05/13/2021 0557 ?Gross per 24 hour  ?Intake 315 ml  ?Output --  ?Net 315 ml  ? ?Filed Weights  ? 05/12/21 2015  ?Weight: 70.8 kg  ? ? ?Examination: ? ?General exam: Pleasant elderly male sitting up in bed, AAOx3, no distress ?HEENT: No JVD ?CVS: S1-S2, regular rhythm ?Lungs: Decreased breath sounds to bases otherwise clear ?Abdomen: Soft, nontender, bowel sounds present ?Extremities: No edema ?Skin: No rashes ?Psychiatry:  Mood & affect appropriate.  ? ? ? ?Data Reviewed:  ? ?CBC: ?Recent Labs  ?Lab 05/12/21 ?2102 05/13/21 ?0200 05/13/21 ?0729  ?WBC 8.9  --   --   ?NEUTROABS 6.5  --   --   ?HGB 6.6* 6.5* 7.6*  ?HCT 20.6* 20.1* 24.0*  ?MCV 98.1  --   --   ?PLT 307  --   --   ? ?Basic Metabolic Panel: ?Recent Labs  ?Lab 05/12/21 ?2102  ?NA 139  ?K 4.7  ?CL 99  ?CO2 29  ?GLUCOSE 219*  ?BUN 48*  ?CREATININE 7.69*  ?CALCIUM 8.3*  ? ?GFR: ?Estimated Creatinine Clearance: 6.4 mL/min (A) (by C-G formula  based on SCr of 7.69 mg/dL (H)). ?Liver Function Tests: ?Recent Labs  ?Lab 05/12/21 ?2102  ?AST 14*  ?ALT 13  ?ALKPHOS 91  ?BILITOT 0.3  ?PROT 6.8  ?ALBUMIN 2.9*  ? ?No results for input(s): LIPASE, AMYLASE in the last 168 hours. ?No results for input(s): AMMONIA in the last 168 hours. ?Coagulation Profile: ?No results for input(s): INR, PROTIME in the last 168 hours. ?Cardiac Enzymes: ?No results for input(s): CKTOTAL, CKMB, CKMBINDEX, TROPONINI in the last 168 hours. ?BNP (last 3 results) ?No results for input(s): PROBNP in the last 8760 hours. ?HbA1C: ?No results for input(s): HGBA1C in the last 72 hours. ?CBG: ?Recent Labs  ?Lab 05/13/21 ?0600 05/13/21 ?6644  ?GLUCAP 171* 137*  ? ?Lipid Profile: ?No  results for input(s): CHOL, HDL, LDLCALC, TRIG, CHOLHDL, LDLDIRECT in the last 72 hours. ?Thyroid Function Tests: ?No results for input(s): TSH, T4TOTAL, FREET4, T3FREE, THYROIDAB in the last 72 hours. ?Anemia Panel: ?No results for input(s): VITAMINB12, FOLATE, FERRITIN, TIBC, IRON, RETICCTPCT in the last 72 hours. ?Urine analysis: ?No results found for: COLORURINE, APPEARANCEUR, [place], [place], [place], [place], [place], KETONESUR, PROTEINUR, [place], NITRITE, LEUKOCYTESUR ?Sepsis Labs: ?'@LABRCNTIP'$ (procalcitonin:4,lacticidven:4) ? ?)No results found for this or any previous visit (from the past 240 hour(s)).  ? ?Radiology Studies: ?No results found. ? ? ?Scheduled Meds: ? sodium chloride   Intravenous Once  ? insulin aspart  0-6 Units Subcutaneous Q4H  ? pantoprazole (PROTONIX) IV  40 mg Intravenous Q12H  ? sodium chloride flush  3 mL Intravenous Q12H  ? ?Continuous Infusions: ? ? LOS: 0 days  ? ? ?Time spent: 20mn ? ? ? ?PDomenic Polite MD ?[facility] ? ? ?05/13/2021, 9:45 AM  ?  ?

## 2021-05-13 NOTE — Consult Note (Signed)
Renal Service ?Consult Note ?Nicholas [name] ? ?[name] ?05/13/2021 ?[name], MD ?Requesting Physician: Dr. Broadus John, P.  ? ?Reason for Consult: ESRD pt w/ GI bleed ?HPI: The patient is a 88 y.o. year-old w/ hx of DJD, DM2, ESRD on HD, HL, HTN on psoriasis who presented to ED yesterday for bloody stools x 2 in the last 24 hrs. Pt was admitted in March here for acute L MCA stroke. In ED BP 105-130/ 50-75, HR 80, RR 18, afeb.  Hb 6.6, chemistries okay. Glu 219. Pt given IV PPI and GI was consulted. PRBC's ordered and pt admitted.  ? ?Pt seen in ED room. No c/o's at this time.  Last HD was on Tuesday 4/18.  Has been on HD several years. No sob or pnd / orthopnea, no leg edema. Was here for CVA 3 wks ago, has been getting better and better w/ regards to using his R hand and arm since dc.   ? ?ROS - denies CP, no joint pain, no HA, no blurry vision, no rash, no diarrhea, no nausea/ vomiting ? ? ?Past Medical History  ?Past Medical History:  ?Diagnosis Date  ? Arthritis   ? Chronic kidney disease   ? Diabetes mellitus ([place])   ? Dialysis patient [facility])   ? GERD (gastroesophageal reflux disease)   ? occ  ? Heart murmur   ? Hypercholesteremia   ? Hypertension   ? Psoriasis   ? ?Past Surgical History  ?Past Surgical History:  ?Procedure Laterality Date  ? CARDIAC CATHETERIZATION  09  ? "some blockage" with collaterals by cath at [facility] ~ 2009; no intervention required; reportedly, no routine cardiology f/u recommended   ? COLONOSCOPY WITH PROPOFOL N/A 07/06/2020  ? Procedure: COLONOSCOPY WITH PROPOFOL;  Surgeon: Ronald Lobo, MD;  Location: [place];  Service: Endoscopy;  Laterality: N/A;  ? CORONARY ATHERECTOMY N/A 08/09/2018  ? Procedure: CORONARY ATHERECTOMY;  Surgeon: Jettie Booze, MD;  Location: [place] [facility];  Service: Cardiovascular;  Laterality: N/A;  ? CORONARY STENT INTERVENTION N/A 08/09/2018  ? Procedure: CORONARY STENT INTERVENTION;  Surgeon: Jettie Booze, MD;  Location:  [place] [facility];  Service: Cardiovascular;  Laterality: N/A;  ? ESOPHAGOGASTRODUODENOSCOPY (EGD) WITH PROPOFOL N/A 07/06/2020  ? Procedure: ESOPHAGOGASTRODUODENOSCOPY (EGD) WITH PROPOFOL;  Surgeon: Ronald Lobo, MD;  Location: [place];  Service: Endoscopy;  Laterality: N/A;  ? INGUINAL HERNIA REPAIR Right 04/13/2012  ? Procedure: HERNIA REPAIR INGUINAL INCARCERATED;  Surgeon: Gayland Curry, MD;  Location: [place];  Service: General;  Laterality: Right;  ? INSERTION OF MESH Right 04/13/2012  ? Procedure: INSERTION OF MESH;  Surgeon: Gayland Curry, MD;  Location: [place];  Service: General;  Laterality: Right;  ? IR THORACENTESIS ASP PLEURAL SPACE W/IMG GUIDE  06/08/2020  ? LEFT HEART CATH N/A 08/09/2018  ? Procedure: Left Heart Cath;  Surgeon: Jettie Booze, MD;  Location: [place] [facility];  Service: Cardiovascular;  Laterality: N/A;  ? LEFT HEART CATH AND CORONARY ANGIOGRAPHY N/A 08/08/2018  ? Procedure: LEFT HEART CATH AND CORONARY ANGIOGRAPHY;  Surgeon: Troy Sine, MD;  Location: [place] [facility];  Service: Cardiovascular;  Laterality: N/A;  ? LEFT HEART CATH AND CORONARY ANGIOGRAPHY N/A 06/05/2020  ? Procedure: LEFT HEART CATH AND CORONARY ANGIOGRAPHY;  Surgeon: Belva Crome, MD;  Location: [place] [facility];  Service: Cardiovascular;  Laterality: N/A;  ? VASECTOMY    ? ?Family History  ?Family History  ?Problem Relation Age of  Onset  ? Diabetes Mother   ? Stroke Father   ? Diabetes Sister   ? Diabetes Brother   ? ?Social History  reports that he quit smoking about 34 years ago. His smoking use included cigarettes. He has a 15.00 pack-year smoking history. He has never used smokeless tobacco. He reports that he does not drink alcohol and does not use drugs. ?Allergies  ?Allergies  ?Allergen Reactions  ? Hydrochlorothiazide Other (See Comments)  ?  Unknown per [place] records  ? ?Home medications ?Prior to Admission medications   ?Medication Sig Start Date End Date Taking? Authorizing Provider   ?acetaminophen (TYLENOL) 500 MG tablet Take 500 mg by mouth 3 (three) times daily as needed for moderate pain.   Yes [provider]  ?aspirin EC 81 MG EC tablet Take 1 tablet (81 mg total) by mouth daily. Swallow whole. 04/18/21 07/17/21 Yes [name], MD  ?atorvastatin (LIPITOR) 80 MG tablet Take 80 mg by mouth at bedtime.   Yes [provider]  ?cinacalcet (SENSIPAR) 30 MG tablet Take by mouth 3 (three) times a week. Harrell Lark, Sat 09/18/20  Yes [provider]  ?clopidogrel (PLAVIX) 75 MG tablet Take 1 tablet (75 mg total) by mouth daily with breakfast. Restart after 5 days 09/18/20  Yes Domenic Polite, MD  ?diphenhydrAMINE (BENADRYL) 25 mg capsule Take 25 mg by mouth daily as needed for itching. 04/06/21  Yes [provider]  ?Ensure Plus (ENSURE PLUS) LIQD Take 237 mLs by mouth 2 (two) times daily between meals. Chocolate   Yes [provider]  ?Fluticasone Propionate,sensor, (ARMONAIR DIGIHALER) 232 MCG/ACT AEPB Inhale 1 puff into the lungs 2 (two) times daily as needed (shortness of breath).   Yes [provider]  ?HYDROcodone-acetaminophen (NORCO/VICODIN) 5-325 MG tablet Take 1 tablet by mouth every 12 (twelve) hours as needed for moderate pain.   Yes [provider]  ?insulin NPH-regular Human (70-30) 100 UNIT/ML injection Inject 10 Units into the skin daily with breakfast. Takes 20 units in the morning if blood sugar is over 140 ?Patient taking differently: Inject 20 Units into the skin See admin instructions. Takes 20 units in the morning if blood sugar is over 140 09/13/20  Yes Domenic Polite, MD  ?midodrine (PROAMATINE) 5 MG tablet Take 1 tablet (5 mg total) by mouth 3 (three) times daily with meals. 04/17/21  Yes [name], MD  ?nitroGLYCERIN (NITROSTAT) 0.4 MG SL tablet Place 1 tablet (0.4 mg total) under the tongue every 5 (five) minutes x 3 doses as needed for chest pain. 08/10/18  Yes [name], MD  ? ? ? ?Vitals:  ?  05/13/21 0700 05/13/21 0715 05/13/21 0800 05/13/21 1015  ?BP: (!) 106/54 (!) 118/57 (!) 115/57 (!) 105/57  ?Pulse: 74 75 76 80  ?Resp: '15 15 14 18  '$ ?Temp:  97.8 ?F (36.6 ?C)    ?TempSrc:  Oral    ?SpO2: 100% 100% 100% 97%  ?Weight:      ?Height:      ? ?Exam ?Gen alert, no distress ?No rash, cyanosis or gangrene ?Sclera anicteric, throat clear  ?No jvd or bruits ?Chest clear bilat to bases, no rales/ wheezing ?RRR no MRG ?Abd soft ntnd no mass or ascites +bs ?GU normal ?MS no joint effusions or deformity ?Ext no LE or UE edema, no wounds or ulcers ?Neuro is alert, Ox 3 , nf ?    ? ? ? ? ? Home meds include - asa, lipitor, sensipar 30 tts, plavix, ensure  plus, armonair, norco prn, 70-30 insulin qam, midodrine 5 tid, sl ntg, prns/ vits/ supps ? ? ? ? OP HD: [facility] [phone], ext [phone]) ?   3h  70kg  3K/2.5 bath  400/500  LUA AVF  Hep none ? - last OP Hb was 9.2 on 4/06 ? - not on esa/ fe ? - no vdra ? ? ?Assessment/ Plan: ?GI bleed - w/ melena, Hb 6.6. SP 1 u prbc's this am. GI consulted. Per pmd ?Recent acute CVA - w/ R sided weakness ?ESRD - on HD TTS. No missed HD. Plan HD today.  ?ling fluid. No gross vol ^ on exam. Gets midodrine 5 mg tid for BP support. ?Anemia esrd - not on esa at OP unit, last Hb was 9.2 on 4/06. Transfuse Hb < 7.   ?MBD ckd - CCa in range, will add on phos. Cont sensipar.  ?DM - on insulin ?  ? ? ? ?Kelly Splinter  MD ?05/13/2021, 11:03 AM ?Recent Labs  ?Lab 05/12/21 ?2102 05/13/21 ?0200 05/13/21 ?0729  ?HGB 6.6* 6.5* 7.6*  ?ALBUMIN 2.9*  --   --   ?CALCIUM 8.3*  --   --   ?CREATININE 7.69*  --   --   ?K 4.7  --   --   ? ? ?

## 2021-05-13 NOTE — H&P (Signed)
?History and Physical  ? ? ?Nicholas [name] ZOX:[id number] DOB: January 24, 1931 DOA: 05/12/2021 ? ?PCP: Windy Fast, MD  ? ?Patient coming from: Home  ? ?Chief Complaint: Bloody stools  ? ?HPI: Nicholas [name] is a pleasant 88 y.o. male with medical history significant for ESRD on hemodialysis, type 2 diabetes mellitus, CAD, and ischemic left MCA stroke 1 month ago, now presenting to emergency department after 2 bloody stools.  Patient reports that he passed dark red blood per rectum the night of 05/11/2021, and another larger episode today.  He denies any associated abdominal pain, nausea, vomiting, chest pain, lightheadedness, or shortness of breath.  He has been taking aspirin and Plavix since the recent stroke, does not take any PPI or H2 blocker, and denies using any over-the-counter pain medications. ? ?Patient believes that the stool appeared similar to the episode he had in June 2022 when he was admitted, had normal EGD, diverticulosis on colonoscopy, and was suspected to have diverticular bleeding at that time. ? ?He reports completing dialysis on 05/11/2021 without incident. ? ?[facility] ED Course: Upon arrival to the ED, patient is found to be afebrile, saturating mid 90s on 2 L/min of supplemental oxygen, and with blood pressure stable in the low 811B systolic.  CBC features a hemoglobin of 6.6.  Chemistry panel notable for glucose 219 and BUN 48.  Patient was given 40 mg IV Protonix, GI was consulted by the ED physician, and patient was transferred to [facility] for admission. ? ?Review of Systems:  ?All other systems reviewed and apart from HPI, are negative. ? ?Past Medical History:  ?Diagnosis Date  ? Arthritis   ? Chronic kidney disease   ? Diabetes mellitus ([place])   ? Dialysis patient [facility])   ? GERD (gastroesophageal reflux disease)   ? occ  ? Heart murmur   ? Hypercholesteremia   ? Hypertension   ? Psoriasis   ? ? ?Past Surgical History:  ?Procedure Laterality Date  ? CARDIAC CATHETERIZATION  09  ? "some  blockage" with collaterals by cath at [facility] ~ 2009; no intervention required; reportedly, no routine cardiology f/u recommended   ? COLONOSCOPY WITH PROPOFOL N/A 07/06/2020  ? Procedure: COLONOSCOPY WITH PROPOFOL;  Surgeon: Ronald Lobo, MD;  Location: [place];  Service: Endoscopy;  Laterality: N/A;  ? CORONARY ATHERECTOMY N/A 08/09/2018  ? Procedure: CORONARY ATHERECTOMY;  Surgeon: Jettie Booze, MD;  Location: [place] CV LAB;  Service: Cardiovascular;  Laterality: N/A;  ? CORONARY STENT INTERVENTION N/A 08/09/2018  ? Procedure: CORONARY STENT INTERVENTION;  Surgeon: Jettie Booze, MD;  Location: [place] CV LAB;  Service: Cardiovascular;  Laterality: N/A;  ? ESOPHAGOGASTRODUODENOSCOPY (EGD) WITH PROPOFOL N/A 07/06/2020  ? Procedure: ESOPHAGOGASTRODUODENOSCOPY (EGD) WITH PROPOFOL;  Surgeon: Ronald Lobo, MD;  Location: [place];  Service: Endoscopy;  Laterality: N/A;  ? INGUINAL HERNIA REPAIR Right 04/13/2012  ? Procedure: HERNIA REPAIR INGUINAL INCARCERATED;  Surgeon: Gayland Curry, MD;  Location: [place];  Service: General;  Laterality: Right;  ? INSERTION OF MESH Right 04/13/2012  ? Procedure: INSERTION OF MESH;  Surgeon: Gayland Curry, MD;  Location: [place];  Service: General;  Laterality: Right;  ? IR THORACENTESIS ASP PLEURAL SPACE W/IMG GUIDE  06/08/2020  ? LEFT HEART CATH N/A 08/09/2018  ? Procedure: Left Heart Cath;  Surgeon: Jettie Booze, MD;  Location: [place] CV LAB;  Service: Cardiovascular;  Laterality: N/A;  ? LEFT HEART CATH AND CORONARY ANGIOGRAPHY N/A 08/08/2018  ? Procedure: LEFT HEART CATH  AND CORONARY ANGIOGRAPHY;  Surgeon: Troy Sine, MD;  Location: [place] [facility];  Service: Cardiovascular;  Laterality: N/A;  ? LEFT HEART CATH AND CORONARY ANGIOGRAPHY N/A 06/05/2020  ? Procedure: LEFT HEART CATH AND CORONARY ANGIOGRAPHY;  Surgeon: Belva Crome, MD;  Location: [place] CV LAB;  Service: Cardiovascular;  Laterality: N/A;  ? VASECTOMY    ? ? ?Social  History:  ? reports that he quit smoking about 34 years ago. His smoking use included cigarettes. He has a 15.00 pack-year smoking history. He has never used smokeless tobacco. He reports that he does not drink alcohol and does not use drugs. ? ?Allergies  ?Allergen Reactions  ? Hydrochlorothiazide Other (See Comments)  ?  Unknown per [place] records  ? ? ?Family History  ?Problem Relation Age of Onset  ? Diabetes Mother   ? Stroke Father   ? Diabetes Sister   ? Diabetes Brother   ? ? ? ?Prior to Admission medications   ?Medication Sig Start Date End Date Taking? Authorizing Provider  ?acetaminophen (TYLENOL) 500 MG tablet Take 500 mg by mouth 3 (three) times daily as needed for moderate pain.    [provider]  ?aspirin EC 81 MG EC tablet Take 1 tablet (81 mg total) by mouth daily. Swallow whole. 04/18/21 07/17/21  [name], MD  ?atorvastatin (LIPITOR) 80 MG tablet Take 80 mg by mouth at bedtime.    [provider]  ?cinacalcet (SENSIPAR) 30 MG tablet Take by mouth 3 (three) times a week. Harrell Lark, Sat 09/18/20   [provider]  ?clopidogrel (PLAVIX) 75 MG tablet Take 1 tablet (75 mg total) by mouth daily with breakfast. Restart after 5 days 09/18/20   Domenic Polite, MD  ?diphenhydrAMINE (BENADRYL) 25 mg capsule Take 1 capsule by mouth as needed. 04/06/21   [provider]  ?Fluticasone Propionate,sensor, (ARMONAIR DIGIHALER) 232 MCG/ACT AEPB Inhale 1 puff into the lungs 2 (two) times daily.    [provider]  ?insulin NPH-regular Human (70-30) 100 UNIT/ML injection Inject 10 Units into the skin daily with breakfast. Takes 20 units in the morning if blood sugar is over 140 09/13/20   Domenic Polite, MD  ?midodrine (PROAMATINE) 5 MG tablet Take 1 tablet (5 mg total) by mouth 3 (three) times daily with meals. 04/17/21   [name], MD  ?nitroGLYCERIN (NITROSTAT) 0.4 MG SL tablet Place 1 tablet (0.4 mg total) under the tongue every 5 (five) minutes x 3 doses as  needed for chest pain. 08/10/18   [name], MD  ? ? ?Physical Exam: ?Vitals:  ? 05/12/21 2245 05/12/21 2315 05/12/21 2330 05/13/21 0030  ?BP: (!) 110/54 (!) 102/55 (!) 103/50 118/69  ?Pulse: 83 86 86 90  ?Resp:    18  ?Temp:      ?SpO2: 100% 100% 99% 100%  ?Weight:      ?Height:      ? ? ?Constitutional: NAD, calm  ?Eyes: PERTLA, lids and conjunctivae normal ?ENMT: Mucous membranes are moist. Posterior pharynx clear of any exudate or lesions.   ?Neck: supple, no masses  ?Respiratory: no wheezing, no crackles. No accessory muscle use.  ?Cardiovascular: S1 & S2 heard, regular rate and rhythm. No extremity edema.   ?Abdomen: No distension, no tenderness, soft. Bowel sounds active.  ?Musculoskeletal: no clubbing / cyanosis. No joint deformity upper and lower extremities.   ?Skin: no significant rashes, lesions, ulcers. Warm, dry, well-perfused. ?Neurologic: CN 2-12 grossly intact. Moving all extremities. Alert and oriented.  ?  Psychiatric: Very pleasant. Cooperative.  ? ? ?Labs and Imaging on Admission: I have personally reviewed following labs and imaging studies ? ?CBC: ?Recent Labs  ?Lab 05/12/21 ?2102  ?WBC 8.9  ?NEUTROABS 6.5  ?HGB 6.6*  ?HCT 20.6*  ?MCV 98.1  ?PLT 307  ? ?Basic Metabolic Panel: ?Recent Labs  ?Lab 05/12/21 ?2102  ?NA 139  ?K 4.7  ?CL 99  ?CO2 29  ?GLUCOSE 219*  ?BUN 48*  ?CREATININE 7.69*  ?CALCIUM 8.3*  ? ?GFR: ?Estimated Creatinine Clearance: 6.4 mL/min (A) (by C-G formula based on SCr of 7.69 mg/dL (H)). ?Liver Function Tests: ?Recent Labs  ?Lab 05/12/21 ?2102  ?AST 14*  ?ALT 13  ?ALKPHOS 91  ?BILITOT 0.3  ?PROT 6.8  ?ALBUMIN 2.9*  ? ?No results for input(s): LIPASE, AMYLASE in the last 168 hours. ?No results for input(s): AMMONIA in the last 168 hours. ?Coagulation Profile: ?No results for input(s): INR, PROTIME in the last 168 hours. ?Cardiac Enzymes: ?No results for input(s): CKTOTAL, CKMB, CKMBINDEX, TROPONINI in the last 168 hours. ?BNP (last 3 results) ?No results for input(s):  PROBNP in the last 8760 hours. ?HbA1C: ?No results for input(s): HGBA1C in the last 72 hours. ?CBG: ?No results for input(s): GLUCAP in the last 168 hours. ?Lipid Profile: ?No results for input(s): CHOL, HDL,

## 2021-05-13 NOTE — Care Management Obs Status (Signed)
MEDICARE OBSERVATION STATUS NOTIFICATION ? ? ?Patient Details  ?Name: Nicholas [name] ?MRN: [id number] ?Date of Birth: 29-Apr-1930 ? ? ?Medicare Observation Status Notification Given:  Yes ?Notice given,to wife, permission to sign given ? ?Verdell Carmine, RN ?05/13/2021, 1:56 PM ?

## 2021-05-13 NOTE — Evaluation (Signed)
Physical Therapy Evaluation ?Patient Details ?Name: Nicholas [name] ?MRN: [id number] ?DOB: 10/09/1930 ?Today's Date: 05/13/2021 ? ?History of Present Illness ? 88 y.o. M admitted on 4/19 with acute lower GI bleed acute blood loss anemia. Transfused 1 unit of PRBC.   PMH - recent lt CVA, HTN, GERD, Arthritis, DM2, ESRD on dialysis.  ?Clinical Impression ? Pt presents to PT with decreased mobility due to decreased strength and balance. Expect pt will make good progress and recommend return home with family and continuation of his HHPT.    ?   ? ?Recommendations for follow up therapy are one component of a multi-disciplinary discharge planning process, led by the attending physician.  Recommendations may be updated based on patient status, additional functional criteria and insurance authorization. ? ?Follow Up Recommendations Home health PT ? ?  ?Assistance Recommended at Discharge Intermittent Supervision/Assistance  ?Patient can return home with the following ? A little help with walking and/or transfers;Help with stairs or ramp for entrance;Assist for transportation ? ?  ?Equipment Recommendations None recommended by PT  ?Recommendations for Other Services ?    ?  ?Functional Status Assessment Patient has had a recent decline in their functional status and demonstrates the ability to make significant improvements in function in a reasonable and predictable amount of time.  ? ?  ?Precautions / Restrictions Precautions ?Precautions: Fall  ? ?  ? ?Mobility ? Bed Mobility ?Overal bed mobility: Needs Assistance ?Bed Mobility: Supine to Sit, Sit to Supine ?  ?  ?Supine to sit: Min guard ?Sit to supine: Min assist ?  ?General bed mobility comments: Assist to bring legs back up onto stretcher ?  ? ?Transfers ?Overall transfer level: Needs assistance ?Equipment used: Rollator (4 wheels) ?Transfers: Sit to/from Stand ?Sit to Stand: Min guard ?  ?  ?  ?  ?  ?General transfer comment: Assist for safety ?   ? ?Ambulation/Gait ?Ambulation/Gait assistance: Min guard ?Gait Distance (Feet): 100 Feet ?Assistive device: Rollator (4 wheels) ?Gait Pattern/deviations: Step-through pattern, Decreased stride length, Trunk flexed ?Gait velocity: decr ?Gait velocity interpretation: 1.31 - 2.62 ft/sec, indicative of limited community ambulator ?  ?General Gait Details: Assist for safety. No overt loss of balance. Pt usually uses rolling walker not rollator ? ?Stairs ?  ?  ?  ?  ?  ? ?Wheelchair Mobility ?  ? ?Modified Rankin (Stroke Patients Only) ?  ? ?  ? ?Balance Overall balance assessment: Needs assistance ?Sitting-balance support: No upper extremity supported, Feet supported ?Sitting balance-Leahy Scale: Fair ?  ?  ?Standing balance support: Bilateral upper extremity supported ?Standing balance-Leahy Scale: Poor ?Standing balance comment: UE support and min guard for static standing ?  ?  ?  ?  ?  ?  ?  ?  ?  ?  ?  ?   ? ? ? ?Pertinent Vitals/Pain Pain Assessment ?Pain Assessment: No/denies pain  ? ? ?Home Living Family/patient expects to be discharged to:: Private residence ?Living Arrangements: Spouse/significant other;Other relatives (grandson) ?Available Help at Discharge: Family;Available 24 hours/day ?Type of Home: House ?Home Access: Stairs to enter ?Entrance Stairs-Rails: None ?Entrance Stairs-Number of Steps: 1 ?Alternate Level Stairs-Number of Steps: stair lift ?Home Layout: Two level;Bed/bath upstairs ?Home Equipment: Kasandra Knudsen - single point;Shower seat - built in;Wheelchair - Publishing copy (2 wheels) ?   ?  ?Prior Function Prior Level of Function : Needs assist ?  ?  ?  ?  ?  ?  ?Mobility Comments: Uses rolling walker ?  ?  ? ? ?Hand  Dominance  ? Dominant Hand: Right ? ?  ?Extremity/Trunk Assessment  ? Upper Extremity Assessment ?Upper Extremity Assessment: Defer to OT evaluation ?  ? ?Lower Extremity Assessment ?Lower Extremity Assessment: Generalized weakness ?  ? ?   ?Communication  ? Communication: No  difficulties  ?Cognition Arousal/Alertness: Awake/alert ?Behavior During Therapy: [facility] for tasks assessed/performed ?Overall Cognitive Status: Within Functional Limits for tasks assessed ?  ?  ?  ?  ?  ?  ?  ?  ?  ?  ?  ?  ?  ?  ?  ?  ?  ?  ?  ? ?  ?General Comments   ? ?  ?Exercises    ? ?Assessment/Plan  ?  ?PT Assessment Patient needs continued PT services  ?PT Problem List Decreased strength;Decreased activity tolerance;Decreased balance;Decreased mobility ? ?   ?  ?PT Treatment Interventions DME instruction;Gait training;Functional mobility training;Therapeutic activities;Therapeutic exercise;Balance training;Patient/family education   ? ?PT Goals (Current goals can be found in the Care Plan section)  ?Acute Rehab PT Goals ?Patient Stated Goal: return home ?PT Goal Formulation: With patient ?Time For Goal Achievement: 05/27/21 ?Potential to Achieve Goals: Good ? ?  ?Frequency Min 3X/week ?  ? ? ?Co-evaluation   ?  ?  ?  ?  ? ? ?  ?AM-PAC PT "6 Clicks" Mobility  ?Outcome Measure Help needed turning from your back to your side while in a flat bed without using bedrails?: None ?Help needed moving from lying on your back to sitting on the side of a flat bed without using bedrails?: A Little ?Help needed moving to and from a bed to a chair (including a wheelchair)?: A Little ?Help needed standing up from a chair using your arms (e.g., wheelchair or bedside chair)?: A Little ?Help needed to walk in hospital room?: A Little ?Help needed climbing 3-5 steps with a railing? : A Little ?6 Click Score: 19 ? ?  ?End of Session Equipment Utilized During Treatment: Other (comment) (used pt's belt as gait belt) ?Activity Tolerance: Patient tolerated treatment well ?Patient left: in bed;with call bell/phone within reach;with family/visitor present (on stretcher) ?Nurse Communication: Mobility status ?PT Visit Diagnosis: Other abnormalities of gait and mobility (R26.89) ?  ? ?Time: 8502-7741 ?PT Time Calculation (min) (ACUTE  ONLY): 18 min ? ? ?Charges:   PT Evaluation ?$PT Eval Moderate Complexity: 1 Mod ?  ?  ?   ? ? ?[facility] ?Acute Rehabilitation Services ?Office [phone] ? ? ?Shary Decamp [facility] ?05/13/2021, 1:55 PM ?

## 2021-05-13 NOTE — Progress Notes (Signed)
Pt admitted to rm 5 from ED. Initiated tele. CHG given. Oriented pt to the unit. VSS. Call bell within reach.  ? ?Lavenia Atlas, RN ? ?

## 2021-05-14 DIAGNOSIS — N250 Renal osteodystrophy: Secondary | ICD-10-CM | POA: Diagnosis not present

## 2021-05-14 DIAGNOSIS — N186 End stage renal disease: Secondary | ICD-10-CM | POA: Diagnosis not present

## 2021-05-14 DIAGNOSIS — E1129 Type 2 diabetes mellitus with other diabetic kidney complication: Secondary | ICD-10-CM | POA: Diagnosis not present

## 2021-05-14 DIAGNOSIS — K5730 Diverticulosis of large intestine without perforation or abscess without bleeding: Secondary | ICD-10-CM | POA: Diagnosis not present

## 2021-05-14 DIAGNOSIS — Z794 Long term (current) use of insulin: Secondary | ICD-10-CM | POA: Diagnosis not present

## 2021-05-14 DIAGNOSIS — K922 Gastrointestinal hemorrhage, unspecified: Secondary | ICD-10-CM | POA: Diagnosis not present

## 2021-05-14 DIAGNOSIS — D631 Anemia in chronic kidney disease: Secondary | ICD-10-CM | POA: Diagnosis not present

## 2021-05-14 DIAGNOSIS — Z992 Dependence on renal dialysis: Secondary | ICD-10-CM | POA: Diagnosis not present

## 2021-05-14 DIAGNOSIS — K921 Melena: Secondary | ICD-10-CM | POA: Diagnosis not present

## 2021-05-14 DIAGNOSIS — I120 Hypertensive chronic kidney disease with stage 5 chronic kidney disease or end stage renal disease: Secondary | ICD-10-CM | POA: Diagnosis not present

## 2021-05-14 LAB — BASIC METABOLIC PANEL
Anion gap: 8 (ref 5–15)
BUN: 20 mg/dL (ref 8–23)
CO2: 30 mmol/L (ref 22–32)
Calcium: 8.2 mg/dL — ABNORMAL LOW (ref 8.9–10.3)
Chloride: 98 mmol/L (ref 98–111)
Creatinine, Ser: 4.19 mg/dL — ABNORMAL HIGH (ref 0.61–1.24)
GFR, Estimated: 13 mL/min — ABNORMAL LOW (ref >60)
Glucose, Bld: 186 mg/dL — ABNORMAL HIGH (ref 70–99)
Potassium: 3.7 mmol/L (ref 3.5–5.1)
Sodium: 136 mmol/L (ref 135–145)

## 2021-05-14 LAB — CBC
HCT: 21.8 % — ABNORMAL LOW (ref 39.0–52.0)
Hemoglobin: 7.4 g/dL — ABNORMAL LOW (ref 13.0–17.0)
MCH: 31.8 pg (ref 26.0–34.0)
MCHC: 33.9 g/dL (ref 30.0–36.0)
MCV: 93.6 fL (ref 80.0–100.0)
Platelets: 239 10*3/uL (ref 150–400)
RBC: 2.33 MIL/uL — ABNORMAL LOW (ref 4.22–5.81)
RDW: 16.2 % — ABNORMAL HIGH (ref 11.5–15.5)
WBC: 6.6 10*3/uL (ref 4.0–10.5)
nRBC: 0 % (ref 0.0–0.2)

## 2021-05-14 LAB — BPAM RBC
Blood Product Expiration Date: 202305172359
ISSUE DATE / TIME: 202304200300
Unit Type and Rh: 600

## 2021-05-14 LAB — TYPE AND SCREEN
ABO/RH(D): A NEG
Antibody Screen: NEGATIVE
Unit division: 0

## 2021-05-14 LAB — GLUCOSE, CAPILLARY
Glucose-Capillary: 144 mg/dL — ABNORMAL HIGH (ref 70–99)
Glucose-Capillary: 142 mg/dL — ABNORMAL HIGH (ref 70–99)
Glucose-Capillary: 229 mg/dL — ABNORMAL HIGH (ref 70–99)
Glucose-Capillary: 192 mg/dL — ABNORMAL HIGH (ref 70–99)

## 2021-05-14 MED ORDER — PSYLLIUM 95 % PO PACK
1.0000 | PACK | Freq: Two times a day (BID) | ORAL | Status: DC
  Administered 2021-05-14: 1 via ORAL
  Filled 2021-05-14 (×2): qty 1

## 2021-05-14 MED ORDER — CLOPIDOGREL BISULFATE 75 MG PO TABS
75.0000 mg | ORAL_TABLET | Freq: Every day | ORAL | Status: DC
  Administered 2021-05-14: 75 mg via ORAL
  Filled 2021-05-14: qty 1

## 2021-05-14 MED ORDER — MIDODRINE HCL 5 MG PO TABS: 5.0000 mg | ORAL_TABLET | Freq: Three times a day (TID) | ORAL | Status: DC

## 2021-05-14 MED ORDER — PSYLLIUM 95 % PO PACK: 1.0000 | PACK | Freq: Every day | ORAL | 0 refills | Status: DC

## 2021-05-14 MED ORDER — CHLORHEXIDINE GLUCONATE CLOTH 2 % EX PADS: 6.0000 | MEDICATED_PAD | Freq: Every day | CUTANEOUS | Status: DC

## 2021-05-14 MED ORDER — ASPIRIN EC 81 MG PO TBEC
81.0000 mg | DELAYED_RELEASE_TABLET | Freq: Every day | ORAL | Status: DC
  Administered 2021-05-14: 81 mg via ORAL
  Filled 2021-05-14: qty 1

## 2021-05-14 NOTE — Progress Notes (Addendum)
Pt's chart reviewed. Pt receives out-pt HD at the [facility] HD [facility] [place] [employer]. Pt can receive treatment tomorrow at 12:00 pm. Spoke to pt via phone and he states that he would have transportation to out-pt HD if d/c in the am. Pt thought he may be discharged today and requested that navigator contact MD to clarify. Attending contacted and she spoke to pt. Plan is for pt to d/c today and resume out-pt HD tomorrow. Returned call to RN at [facility] to make clinic aware that pt will resume care tomorrow and that pt mentioned arriving at 11:00/11:30. RN was agreeable to that time. Clinic requests d/c summary be faxed for continuation of care. Will fax summary as soon as it is available.  ? ?Melven Sartorius ?Renal Navigator ?[phone] ? ?Addendum at 5:18 pm: ?Faxed attending progress note for today and renal note from yesterday and today (fax# [phone]). ?

## 2021-05-14 NOTE — Plan of Care (Signed)
?  Problem: Education: ?Goal: Knowledge of General Education information will improve ?Description: Including pain rating scale, medication(s)/side effects and non-pharmacologic comfort measures ?Outcome: Adequate for Discharge ?  ?Problem: Health Behavior/Discharge Planning: ?Goal: Ability to manage health-related needs will improve ?Outcome: Adequate for Discharge ?  ?Problem: Clinical Measurements: ?Goal: Ability to maintain clinical measurements within normal limits will improve ?Outcome: Adequate for Discharge ?Goal: Will remain free from infection ?Outcome: Adequate for Discharge ?Goal: Diagnostic test results will improve ?Outcome: Adequate for Discharge ?Goal: Respiratory complications will improve ?Outcome: Adequate for Discharge ?Goal: Cardiovascular complication will be avoided ?Outcome: Adequate for Discharge ?  ?Problem: Coping: ?Goal: Level of anxiety will decrease ?Outcome: Adequate for Discharge ?  ?Problem: Elimination: ?Goal: Will not experience complications related to bowel motility ?Outcome: Adequate for Discharge ?Goal: Will not experience complications related to urinary retention ?Outcome: Adequate for Discharge ?  ?Problem: Skin Integrity: ?Goal: Risk for impaired skin integrity will decrease ?Outcome: Adequate for Discharge ?  ?Problem: Safety: ?Goal: Ability to remain free from injury will improve ?Outcome: Adequate for Discharge ?  ?Problem: Pain Managment: ?Goal: General experience of comfort will improve ?Outcome: Adequate for Discharge ?  ?

## 2021-05-14 NOTE — Progress Notes (Signed)
Physical Therapy Treatment ?Patient Details ?Name: Nicholas [name] ?MRN: [id number] ?DOB: 07-13-1930 ?Today's Date: 05/14/2021 ? ? ?History of Present Illness 88 y.o. M admitted on 4/19 with acute lower GI bleed acute blood loss anemia. Transfused 1 unit of PRBC.   PMH - recent lt CVA, HTN, GERD, Arthritis, DM2, ESRD on dialysis. ? ?  ?PT Comments  ? ? Pt with generalized fatigue with mobility. Continue to recommend pt mobilize with staff to incr activity and recommend HHPT for home.    ?Recommendations for follow up therapy are one component of a multi-disciplinary discharge planning process, led by the attending physician.  Recommendations may be updated based on patient status, additional functional criteria and insurance authorization. ? ?Follow Up Recommendations ? Home health PT ?  ?  ?Assistance Recommended at Discharge Intermittent Supervision/Assistance  ?Patient can return home with the following A little help with walking and/or transfers;Help with stairs or ramp for entrance;Assist for transportation ?  ?Equipment Recommendations ? None recommended by PT  ?  ?Recommendations for Other Services   ? ? ?  ?Precautions / Restrictions Precautions ?Precautions: Fall ?Restrictions ?Weight Bearing Restrictions: No  ?  ? ?Mobility ? Bed Mobility ?Overal bed mobility: Needs Assistance ?Bed Mobility: Supine to Sit ?  ?  ?Supine to sit: Supervision, HOB elevated ?  ?  ?General bed mobility comments: Incr time to perform ?  ? ?Transfers ?Overall transfer level: Needs assistance ?Equipment used: Rolling walker (2 wheels) ?Transfers: Sit to/from Stand ?Sit to Stand: Min guard ?  ?  ?  ?  ?  ?General transfer comment: Assist for safety ?  ? ?Ambulation/Gait ?Ambulation/Gait assistance: Supervision ?Gait Distance (Feet): 80 Feet ?Assistive device: Rolling walker (2 wheels) ?Gait Pattern/deviations: Step-through pattern, Decreased stride length, Trunk flexed ?Gait velocity: decr ?Gait velocity interpretation: 1.31 - 2.62  ft/sec, indicative of limited community ambulator ?  ?General Gait Details: Assist for safety. ? ? ?Stairs ?  ?  ?  ?  ?  ? ? ?Wheelchair Mobility ?  ? ?Modified Rankin (Stroke Patients Only) ?  ? ? ?  ?Balance Overall balance assessment: Needs assistance ?Sitting-balance support: No upper extremity supported, Feet supported ?Sitting balance-Leahy Scale: Fair ?  ?  ?Standing balance support: Bilateral upper extremity supported ?Standing balance-Leahy Scale: Poor ?Standing balance comment: UE support and min guard for static standing ?  ?  ?  ?  ?  ?  ?  ?  ?  ?  ?  ?  ? ?  ?Cognition Arousal/Alertness: Awake/alert ?Behavior During Therapy: [facility] for tasks assessed/performed ?Overall Cognitive Status: Within Functional Limits for tasks assessed ?  ?  ?  ?  ?  ?  ?  ?  ?  ?  ?  ?  ?  ?  ?  ?  ?  ?  ?  ? ?  ?Exercises   ? ?  ?General Comments   ?  ?  ? ?Pertinent Vitals/Pain Pain Assessment ?Pain Assessment: No/denies pain  ? ? ?Home Living   ?  ?  ?  ?  ?  ?  ?  ?  ?  ?   ?  ?Prior Function    ?  ?  ?   ? ?PT Goals (current goals can now be found in the care plan section) Acute Rehab PT Goals ?Patient Stated Goal: return home ?Progress towards PT goals: Progressing toward goals ? ?  ?Frequency ? ? ? Min 3X/week ? ? ? ?  ?PT Plan Current  plan remains appropriate  ? ? ?Co-evaluation   ?  ?  ?  ?  ? ?  ?AM-PAC PT "6 Clicks" Mobility   ?Outcome Measure ? Help needed turning from your back to your side while in a flat bed without using bedrails?: None ?Help needed moving from lying on your back to sitting on the side of a flat bed without using bedrails?: A Little ?Help needed moving to and from a bed to a chair (including a wheelchair)?: A Little ?Help needed standing up from a chair using your arms (e.g., wheelchair or bedside chair)?: A Little ?Help needed to walk in hospital room?: A Little ?Help needed climbing 3-5 steps with a railing? : A Little ?6 Click Score: 19 ? ?  ?End of Session Equipment Utilized During  Treatment: Gait belt;Oxygen ?Activity Tolerance: Patient limited by fatigue ?Patient left: with call bell/phone within reach;in chair;with chair alarm set (on stretcher) ?Nurse Communication: Mobility status ?PT Visit Diagnosis: Other abnormalities of gait and mobility (R26.89) ?  ? ? ?Time: 4268-3419 ?PT Time Calculation (min) (ACUTE ONLY): 36 min ? ?Charges:  $Gait Training: 23-37 mins          ?          ? ?[facility] PT ?Acute Rehabilitation Services ?Office [phone] ? ? ? ?Shary Decamp [facility] ?05/14/2021, 10:22 AM ? ?

## 2021-05-14 NOTE — Progress Notes (Signed)
Subjective: ?Patient has not had a bowel movement since Wednesday, he is passing gas and denies abdominal pain. ?He tolerated clear liquid diet well yesterday and his diet is being advanced to renal diet today. ?He received hemodialysis yesterday. ? ?Objective: ?Vital signs in last 24 hours: ?Temp:  [97.5 ?F (36.4 ?C)-98.1 ?F (36.7 ?C)] 97.9 ?F (36.6 ?C) (04/21 0754) ?Pulse Rate:  [74-92] 81 (04/21 0754) ?Resp:  [13-28] 18 (04/21 0754) ?BP: (97-132)/(51-69) 105/60 (04/21 0754) ?SpO2:  [97 %-100 %] 100 % (04/21 0754) ?Weight:  [70.4 kg-72.2 kg] 70.4 kg (04/20 1748) ?Weight change: 1.439 kg ?Last BM Date : 05/12/21 ? ?PE: Elderly, not in distress, able to speak in full sentences ?GENERAL: Mild pallor, no icterus  ?ABDOMEN: Soft, nondistended, nontender, normoactive bowel sounds ?EXTREMITIES: No deformity, no edema ? ?Lab Results: ?Results for orders placed or performed during the hospital encounter of 05/12/21 (from the past 48 hour(s))  ?CBC with Differential     Status: Abnormal  ? Collection Time: 05/12/21  9:02 PM  ?Result Value Ref Range  ? WBC 8.9 4.0 - 10.5 K/uL  ? RBC 2.10 (L) 4.22 - 5.81 MIL/uL  ? Hemoglobin 6.6 (LL) 13.0 - 17.0 g/dL  ?  Comment: This critical result has verified and been called to [facility] RN by Bing Plume on 04 19 2023 at 2117, and has been read back.  ?REPEATED TO VERIFY ?CORRECTED ON 04/20 AT 0914: PREVIOUSLY REPORTED AS 6.6 This critical result has verified and been called to [facility] RN by Bing Plume on 04 19 2023 at 2117, and has been read back.  ?  ? HCT 20.6 (L) 39.0 - 52.0 %  ? MCV 98.1 80.0 - 100.0 fL  ? MCH 31.4 26.0 - 34.0 pg  ? MCHC 32.0 30.0 - 36.0 g/dL  ? RDW 15.2 11.5 - 15.5 %  ? Platelets 307 150 - 400 K/uL  ? nRBC 0.0 0.0 - 0.2 %  ? Neutrophils Relative % 72 %  ? Neutro Abs 6.5 1.7 - 7.7 K/uL  ? Lymphocytes Relative 15 %  ? Lymphs Abs 1.4 0.7 - 4.0 K/uL  ? Monocytes Relative 8 %  ? Monocytes Absolute 0.7 0.1 - 1.0 K/uL  ? Eosinophils Relative 3 %  ? Eosinophils  Absolute 0.2 0.0 - 0.5 K/uL  ? Basophils Relative 1 %  ? Basophils Absolute 0.0 0.0 - 0.1 K/uL  ? Immature Granulocytes 1 %  ? Abs Immature Granulocytes 0.04 0.00 - 0.07 K/uL  ?  Comment: Performed at [facility], [address], [place], [place] [address]  ?Comprehensive metabolic panel     Status: Abnormal  ? Collection Time: 05/12/21  9:02 PM  ?Result Value Ref Range  ? Sodium 139 135 - 145 mmol/L  ? Potassium 4.7 3.5 - 5.1 mmol/L  ? Chloride 99 98 - 111 mmol/L  ? CO2 29 22 - 32 mmol/L  ? Glucose, Bld 219 (H) 70 - 99 mg/dL  ?  Comment: Glucose reference range applies only to samples taken after fasting for at least 8 hours.  ? BUN 48 (H) 8 - 23 mg/dL  ? Creatinine, Ser 7.69 (H) 0.61 - 1.24 mg/dL  ? Calcium 8.3 (L) 8.9 - 10.3 mg/dL  ? Total Protein 6.8 6.5 - 8.1 g/dL  ? Albumin 2.9 (L) 3.5 - 5.0 g/dL  ? AST 14 (L) 15 - 41 U/L  ? ALT 13 0 - 44 U/L  ? Alkaline Phosphatase 91 38 - 126  U/L  ? Total Bilirubin 0.3 0.3 - 1.2 mg/dL  ? GFR, Estimated 6 (L) >60 mL/min  ?  Comment: (NOTE) ?Calculated using the CKD-EPI Creatinine Equation (2021) ?  ? Anion gap 11 5 - 15  ?  Comment: Performed at [facility], [address], [place], [place] [address]  ?Type and screen [place]     Status: None  ? Collection Time: 05/13/21  1:00 AM  ?Result Value Ref Range  ? ABO/RH(D) A NEG   ? Antibody Screen NEG   ? Sample Expiration 05/16/2021,2359   ? Unit Number [id number]   ? Blood Component Type RED CELLS,LR   ? Unit division 00   ? Status of Unit ISSUED,FINAL   ? Transfusion Status OK TO TRANSFUSE   ? Crossmatch Result    ?  Compatible ?Performed at [facility], [place] [address] [place], [place] [address] ?  ?Prepare RBC (crossmatch)     Status: None  ? Collection Time: 05/13/21  1:09 AM  ?Result Value Ref Range  ? Order Confirmation    ?  ORDER PROCESSED BY BLOOD BANK ?Performed at [facility], [place] [address] [place], [place] [address] ?  ?Hemoglobin     Status: Abnormal  ?  Collection Time: 05/13/21  2:00 AM  ?Result Value Ref Range  ? Hemoglobin 6.5 (LL) 13.0 - 17.0 g/dL  ?  Comment: REPEATED TO VERIFY ?THIS CRITICAL RESULT HAS VERIFIED AND BEEN CALLED TO A. POWELL, RN BY JENNIFER COLE ON 04 20 2023 AT 0235, AND HAS BEEN READ BACK.  ?Performed at [facility], [place] [address] [place], [place] [address] ?  ?Hematocrit     Status: Abnormal  ? Collection Time: 05/13/21  2:00 AM  ?Result Value Ref Range  ? HCT 20.1 (L) 39.0 - 52.0 %  ?  Comment: Performed at [facility], [place] [address], [place], [place] [address]  ?CBG monitoring, ED     Status: Abnormal  ? Collection Time: 05/13/21  6:00 AM  ?Result Value Ref Range  ? Glucose-Capillary 171 (H) 70 - 99 mg/dL  ?  Comment: Glucose reference range applies only to samples taken after fasting for at least 8 hours.  ?Hemoglobin     Status: Abnormal  ? Collection Time: 05/13/21  7:29 AM  ?Result Value Ref Range  ? Hemoglobin 7.6 (L) 13.0 - 17.0 g/dL  ?  Comment: Performed at [facility], [place] [address], [place], [place] [address]  ?Hematocrit     Status: Abnormal  ? Collection Time: 05/13/21  7:29 AM  ?Result Value Ref Range  ? HCT 24.0 (L) 39.0 - 52.0 %  ?  Comment: Performed at [facility], [place] [address] [place],  [address]  ?CBG monitoring, ED     Status: Abnormal  ? Collection Time: 05/13/21  7:37 AM  ?Result Value Ref Range  ? Glucose-Capillary 137 (H) 70 - 99 mg/dL  ?  Comment: Glucose reference range applies only to samples taken after fasting for at least 8 hours.  ? Comment 1 Notify RN   ? Comment 2 Document in Chart   ?CBG monitoring, ED     Status: Abnormal  ? Collection Time: 05/13/21 12:07 PM  ?Result Value Ref Range  ? Glucose-Capillary 126 (H) 70 - 99 mg/dL  ?  Comment: Glucose reference range applies only to samples taken after fasting for at least 8 hours.  ?  Comment 1 Notify RN   ? Comment 2 Document in Chart   ?Reticulocytes     Status: Abnormal  ? Collection Time: 05/13/21  2:00 PM  ?Result  Value Ref Range  ? Retic Ct Pct 1.3 0.4 - 3.1 %  ? RBC. 2.39 (L) 4.22 - 5.81 MIL/uL  ? Retic Count, Absolute 30.1 19.0 - 186.0 K/uL  ? Immature Retic Fract 13.9 2.3 - 15.9 %  ?  Comment: Performed at [facility], [place] [address] [place], [place] [address]  ?Vitamin B12     Status: None  ? Collection Time: 05/13/21  4:43 PM  ?Result Value Ref Range  ? Vitamin B-12 285 180 - 914 pg/mL  ?  Comment: (NOTE) ?This assay is not validated for testing neonatal or ?myeloproliferative syndrome specimens for Vitamin B12 levels. ?Performed at [facility], [place] [address], [place], [place] ?[address] ?  ?Folate     Status: None  ? Collection Time: 05/13/21  4:43 PM  ?Result Value Ref Range  ? Folate 7.5 >5.9 ng/mL  ?  Comment: Performed at [facility], [place] [address] [place], [place] [address]  ?Iron and TIBC     Status: Abnormal  ? Collection Time: 05/13/21  4:43 PM  ?Result Value Ref Range  ? Iron 58 45 - 182 ug/dL  ? TIBC 237 (L) 250 - 450 ug/dL  ? Saturation Ratios 25 17.9 - 39.5 %  ? UIBC 179 ug/dL  ?  Comment: Performed at [facility], [place] [address], [place], [place] [address]  ?Ferritin     Status: Abnormal  ? Collection Time: 05/13/21  4:43 PM  ?Result Value Ref Range  ? Ferritin 1,064 (H) 24 - 336 ng/mL  ?  Comment: Performed at [facility], [place] [address], [place], [place] [address]  ?CBC     Status: Abnormal  ? Collection Time: 05/13/21  4:43 PM  ?Result Value Ref Range  ? WBC 6.9 4.0 - 10.5 K/uL  ? RBC 2.42 (L) 4.22 - 5.81 MIL/uL  ? Hemoglobin 7.5 (L) 13.0 - 17.0 g/dL  ? HCT 22.9 (L) 39.0 - 52.0 %  ? MCV 94.6 80.0 - 100.0 fL  ? MCH 31.0 26.0 - 34.0 pg  ? MCHC 32.8 30.0 - 36.0 g/dL  ? RDW 16.3 (H) 11.5 - 15.5 %  ? Platelets 257 150 - 400 K/uL  ? nRBC 0.0 0.0 - 0.2 %  ?  Comment: Performed at [facility], [place] [address], [place], [place] [address]  ?Phosphorus     Status: Abnormal  ? Collection Time: 05/13/21  4:43 PM  ?Result Value Ref Range  ? Phosphorus 5.6 (H) 2.5 - 4.6  mg/dL  ?  Comment: Performed at [facility], [place] [address] [place], [place] [address]  ?Glucose, capillary     Status: Abnormal  ? Collection Time: 05/13/21  4:48 PM  ?Result Value Ref Range  ?

## 2021-05-14 NOTE — Care Management CC44 (Signed)
Condition Code 44 Documentation Completed ? ?Patient Details  ?Name: Nicholas [name] ?MRN: [id number] ?Date of Birth: [birth year] ? ? ?Condition Code 44 given:  Yes ?Patient signature on Condition Code 44 notice:  Yes ?Documentation of 2 MD's agreement:  Yes ?Code 44 added to claim:  Yes ? ? ? ?Bethena Roys, RN ?05/14/2021, 3:52 PM ? ?

## 2021-05-14 NOTE — Progress Notes (Signed)
? [facility] ?Progress Note  ? ?Subjective:   Patient seen and examined at bedside.  Reports no bleeding since admission but has also not had a BM.  Admits to flatulence. Denies CP, SOB, abdominal pain, n/v/d, weakness and fatigue.  Dialysis went well yesterday but states he only usually runs 3hrs and did not like having to run 3.5h.  ? ?Objective ?Vitals:  ? 05/13/21 2323 05/14/21 0417 05/14/21 0754 05/14/21 1120  ?BP: 116/65  105/60   ?Pulse: 92 80 81 81  ?Resp: '16  18 17  '$ ?Temp: 98 ?F (36.7 ?C) 98.1 ?F (36.7 ?C) 97.9 ?F (36.6 ?C) 97.8 ?F (36.6 ?C)  ?TempSrc: Oral Oral Oral Oral  ?SpO2: 99% 100% 100% 100%  ?Weight:      ?Height:      ? ?Physical Exam ?General:well appearing, pleasant male in NAD ?Heart:RRR, no mrg ?Lungs:CTAB, nml WOB on 2L O2 via [place] ?Abdomen:soft, NTND ?Extremities:no LE edema ?Dialysis Access: LU AVF +b/t  ? ?Filed Weights  ? 05/12/21 2015 05/13/21 1551 05/13/21 1748  ?Weight: 70.8 kg 72.2 kg 70.4 kg  ? ? ?Intake/Output Summary (Last 24 hours) at 05/14/2021 1132 ?Last data filed at 05/13/2021 2220 ?Gross per 24 hour  ?Intake 150 ml  ?Output 1000 ml  ?Net -850 ml  ? ? ?Additional Objective ?Labs: ?Basic Metabolic Panel: ?Recent Labs  ?Lab 05/12/21 ?2102 05/13/21 ?1643 05/14/21 ?0132  ?NA 139  --  136  ?K 4.7  --  3.7  ?CL 99  --  98  ?CO2 29  --  30  ?GLUCOSE 219*  --  186*  ?BUN 48*  --  20  ?CREATININE 7.69*  --  4.19*  ?CALCIUM 8.3*  --  8.2*  ?PHOS  --  5.6*  --   ? ?Liver Function Tests: ?Recent Labs  ?Lab 05/12/21 ?2102  ?AST 14*  ?ALT 13  ?ALKPHOS 91  ?BILITOT 0.3  ?PROT 6.8  ?ALBUMIN 2.9*  ? ?CBC: ?Recent Labs  ?Lab 05/12/21 ?2102 05/13/21 ?0200 05/13/21 ?0729 05/13/21 ?1643 05/14/21 ?0132  ?WBC 8.9  --   --  6.9 6.6  ?NEUTROABS 6.5  --   --   --   --   ?HGB 6.6*   < > 7.6* 7.5* 7.4*  ?HCT 20.6*   < > 24.0* 22.9* 21.8*  ?MCV 98.1  --   --  94.6 93.6  ?PLT 307  --   --  257 239  ? < > = values in this interval not displayed.  ? ?CBG: ?Recent Labs  ?Lab 05/13/21 ?1648  05/13/21 ?2333 05/14/21 ?0421 05/14/21 ?8119 05/14/21 ?1128  ?GLUCAP 155* 202* 144* 142* 229*  ? ?Iron Studies:  ?Recent Labs  ?  05/13/21 ?1643  ?IRON 58  ?TIBC 237*  ?FERRITIN 1,064*  ? ?Lab Results  ?Component Value Date  ? INR 1.0 09/09/2020  ? INR 1.0 03/18/2019  ? INR 1.0 09/27/2018  ? ?Studies/Results: ?No results found. ? ?Medications: ? ? sodium chloride   Intravenous Once  ? aspirin EC  81 mg Oral Daily  ? Chlorhexidine Gluconate Cloth  6 each Topical Q0600  ? clopidogrel  75 mg Oral Daily  ? insulin aspart  0-6 Units Subcutaneous Q4H  ? pantoprazole  40 mg Oral Q1200  ? psyllium  1 packet Oral BID  ? sodium chloride flush  3 mL Intravenous Q12H  ? ? ?Dialysis Orders: ?TTS [facility] [phone], [phone]) ?   3h  70kg  3K/2.5 bath  400/500  LUA  AVF  Hep none ? - last OP Hb was 9.2 on 4/06 ? - not on esa/ fe ? - no vdra ?  ?Home meds include - asa, lipitor, sensipar 30 tts, plavix, ensure plus, armonair, norco prn, 70-30 insulin qam, midodrine 5 tid, sl ntg, prns/ vits/ supps ?  ?Assessment/ Plan: ?GI bleed - w/ melena, Hb 6.6 on admit, improved to 7.4 s/p 1 u prbc.  GI consulted - colonoscopy from 6/22 with pandiverticulosis - with dual antiplatelet therapy he will remain at risk for recurrent bleeding. Advised high fiber diet. "If reoccurs plan for NM GI blood loss scan and if positive CTA , consult IR for embolization." ?Recent acute CVA - w/ R sided weakness ?ESRD - on HD TTS. No missed HD. HD tomorrow per regular schedule. 3hrs ?Hypotension/volume - No gross volume on exam. Gets midodrine 5 mg tid for BP support.  UF as tolerated.  ?Anemia esrd - not on esa at OP unit, last Hb was 9.2 on 4/06. Transfuse Hb < 7.  Hgb improved to 7.4 s/p 1 unit pRBC. ?MBD ckd - CCa in range, phos mildly elevated. Cont sensipar. Not on binders. ?DM - on insulin ?Nutrition - Renal diet w/fluid restriction. Alb 2.9 +protein supplements.  ? ?Jen Mow, PA-C ?[facility] ?05/14/2021,11:32 AM ?  LOS: 1 day  ? ? ?

## 2021-05-14 NOTE — Progress Notes (Signed)
Discharge instructions reviewed with pt.  ?Copy of instructions given to pt. Pt waiting for wife to arrive to take him home. Pt aware of 1 script sent to his pharmacy to pick up. ?

## 2021-05-14 NOTE — Progress Notes (Signed)
?PROGRESS NOTE ? ? ? ?Nicholas [name]  VQM:[id number] DOB: [birth year] DOA: 05/12/2021 ?PCP: Windy Fast, MD  ?88 y.o. male with medical history significant for ESRD on hemodialysis, type 2 diabetes mellitus, CAD, and ischemic left MCA stroke 1 month ago, presented to emergency department after 2 bloody stools.   He has been taking aspirin and Plavix since the recent stroke ?-In the ED hemoglobin was 6.6 ? ? ?Subjective: ?-Feels okay, no further episodes today ? ?Assessment and Plan: ? ?Acute lower GI bleeding, likely diverticular ?Acute blood loss anemia ?-Transfused 1 unit of PRBC yesterday ?-Bleeding likely diverticular, pandiverticulosis noted on colonoscopy 6/22 ?-Gastroenterology following, conservative management  ?-No further bleeding yesterday, advance to regular diet ?-Restarted aspirin and Plavix today ?-Hemoglobin stable at 7.5, anemia panel consistent with chronic disease,  repeat hb this afternoon and in am ?-Home tomorrow if stable ?  ?Hx recent ischemic CVA  ?- Admitted 04/15/21 with ischemic left MCA stroke, reports some subtle difficulty with complex tasks but is improving  ?-Restarting dual antiplatelet therapy today ?  ?ESRD  ?- Last dialyzed 4/18  ?-Nephrology consulted, dialyzed yesterday ?  ?Chronic hypoxic respiratory failure/COPD ?- Stable on his usual 2 Lpm supplemental O2  ?  ?CAD  ?- No anginal complaints  ?-Resumed antiplatelet agents ?  ?Type II DM  ?- A1c was 7.6% in March 2023  ?-On insulin 70/30 at baseline, currently on sliding scale insulin only and CBGs are stable ?  ?DVT prophylaxis: SCDs  ?Code Status: Discussed CODE STATUS with the patient again, he wishes to remain full code ?Family Communication: Discussed patient in detail, no family at bedside ?Disposition Plan: Home tomorrow ? ?Consultants: Gastroenterology ? ? ?Procedures:  ? ?Antimicrobials:  ? ? ?Objective: ?Vitals:  ? 05/13/21 2207 05/13/21 2323 05/14/21 0417 05/14/21 0754  ?BP: (!) 121/57 116/65  105/60  ?Pulse: 88 92 80  81  ?Resp: '17 16  18  '$ ?Temp: 98.1 ?F (36.7 ?C) 98 ?F (36.7 ?C) 98.1 ?F (36.7 ?C) 97.9 ?F (36.6 ?C)  ?TempSrc: Oral Oral Oral Oral  ?SpO2: 99% 99% 100% 100%  ?Weight:      ?Height:      ? ? ?Intake/Output Summary (Last 24 hours) at 05/14/2021 1037 ?Last data filed at 05/13/2021 2220 ?Gross per 24 hour  ?Intake 150 ml  ?Output 1000 ml  ?Net -850 ml  ? ?Filed Weights  ? 05/12/21 2015 05/13/21 1551 05/13/21 1748  ?Weight: 70.8 kg 72.2 kg 70.4 kg  ? ? ?Examination: ? ?General exam: Pleasant elderly male sitting up in bed, AAOx3, no distress ?HEENT: No JVD ?CVS: S1-S2, regular rate rhythm ?Lungs: Decreased breath sounds the bases otherwise clear ?Abdomen: Soft, nontender, bowel sounds present ?Extremities: No edema ?Skin: No rashes ?Psychiatry:  Mood & affect appropriate.  ? ? ? ?Data Reviewed:  ? ?CBC: ?Recent Labs  ?Lab 05/12/21 ?2102 05/13/21 ?0200 05/13/21 ?0729 05/13/21 ?1643 05/14/21 ?0132  ?WBC 8.9  --   --  6.9 6.6  ?NEUTROABS 6.5  --   --   --   --   ?HGB 6.6* 6.5* 7.6* 7.5* 7.4*  ?HCT 20.6* 20.1* 24.0* 22.9* 21.8*  ?MCV 98.1  --   --  94.6 93.6  ?PLT 307  --   --  257 239  ? ?Basic Metabolic Panel: ?Recent Labs  ?Lab 05/12/21 ?2102 05/13/21 ?1643 05/14/21 ?0132  ?NA 139  --  136  ?K 4.7  --  3.7  ?CL 99  --  98  ?CO2 29  --  30  ?GLUCOSE 219*  --  186*  ?BUN 48*  --  20  ?CREATININE 7.69*  --  4.19*  ?CALCIUM 8.3*  --  8.2*  ?PHOS  --  5.6*  --   ? ?GFR: ?Estimated Creatinine Clearance: 11.8 mL/min (A) (by C-G formula based on SCr of 4.19 mg/dL (H)). ?Liver Function Tests: ?Recent Labs  ?Lab 05/12/21 ?2102  ?AST 14*  ?ALT 13  ?ALKPHOS 91  ?BILITOT 0.3  ?PROT 6.8  ?ALBUMIN 2.9*  ? ?No results for input(s): LIPASE, AMYLASE in the last 168 hours. ?No results for input(s): AMMONIA in the last 168 hours. ?Coagulation Profile: ?No results for input(s): INR, PROTIME in the last 168 hours. ?Cardiac Enzymes: ?No results for input(s): CKTOTAL, CKMB, CKMBINDEX, TROPONINI in the last 168 hours. ?BNP (last 3 results) ?No  results for input(s): PROBNP in the last 8760 hours. ?HbA1C: ?No results for input(s): HGBA1C in the last 72 hours. ?CBG: ?Recent Labs  ?Lab 05/13/21 ?1207 05/13/21 ?1648 05/13/21 ?2333 05/14/21 ?0421 05/14/21 ?0747  ?GLUCAP 126* 155* 202* 144* 142*  ? ?Lipid Profile: ?No results for input(s): CHOL, HDL, LDLCALC, TRIG, CHOLHDL, LDLDIRECT in the last 72 hours. ?Thyroid Function Tests: ?No results for input(s): TSH, T4TOTAL, FREET4, T3FREE, THYROIDAB in the last 72 hours. ?Anemia Panel: ?Recent Labs  ?  05/13/21 ?1400 05/13/21 ?1643  ?[id number]  --  285  ?FOLATE  --  7.5  ?FERRITIN  --  1,064*  ?TIBC  --  237*  ?IRON  --  58  ?RETICCTPCT 1.3  --   ? ?Urine analysis: ?No results found for: COLORURINE, APPEARANCEUR, [place], [place], [place], [place], [place], KETONESUR, PROTEINUR, [place], NITRITE, LEUKOCYTESUR ?Sepsis Labs: ?'@LABRCNTIP'$ (procalcitonin:4,lacticidven:4) ? ?)No results found for this or any previous visit (from the past 240 hour(s)).  ? ?Radiology Studies: ?No results found. ? ? ?Scheduled Meds: ? sodium chloride   Intravenous Once  ? aspirin EC  81 mg Oral Daily  ? Chlorhexidine Gluconate Cloth  6 each Topical Q0600  ? clopidogrel  75 mg Oral Daily  ? insulin aspart  0-6 Units Subcutaneous Q4H  ? pantoprazole  40 mg Oral Q1200  ? psyllium  1 packet Oral BID  ? sodium chloride flush  3 mL Intravenous Q12H  ? ?Continuous Infusions: ? ? LOS: 1 day  ? ? ?Time spent: 82mn ? ? ? ?PDomenic Polite MD ?[facility] ? ? ?05/14/2021, 10:37 AM  ?  ?

## 2021-05-14 NOTE — Care Management Obs Status (Signed)
MEDICARE OBSERVATION STATUS NOTIFICATION ? ? ?Patient Details  ?Name: Nicholas [name] ?MRN: [id number] ?Date of Birth: 08-29-1930 ? ? ?Medicare Observation Status Notification Given:  Yes ? ? ? ?Bethena Roys, RN ?05/14/2021, 3:52 PM ?

## 2021-05-14 NOTE — Consult Note (Signed)
? ?  [facility] CM Inpatient Consult ? ? ?05/14/2021 ? ?Nicholas [name] ?Mar 29, 1930 ?[id number] ? ?[place] Organization [ACO] Patient: Medicare ACO Reach ? ?Primary Care Provider:  Windy Fast, MD ?[facility] Administration ? ?Patient is currently active with [facility] for chronic disease management services.  Patient has been engaged by a [place].  Our community based plan of care has focused on disease management and community resource support.   ? ?Plan: Follow for progress and disposition needs. ? ?Of note, [facility] Care Management services does not replace or interfere with any services that are needed or arranged by inpatient [facility] care management team.  For additional questions or referrals please contact: ? ?Natividad Brood, RN BSN CCM ?[facility] Liaison ? [phone] business mobile phone ?Toll free office [phone]  ?Fax number: [phone] ?[email or web address] ? ? ? ? ?

## 2021-05-14 NOTE — Care Management (Signed)
05-14-21 1512 Patient is currently active with [facility] for Physical Therapy. Orders are in Epic and referral has been submitted to [facility]. Start of care to begin within 24-48 hours post transition home. Son is at the bedside for transport home via private vehicle. No further needs from Case Manager at this time. ?

## 2021-05-15 ENCOUNTER — Inpatient Hospital Stay (HOSPITAL_COMMUNITY)
Admission: EM | Admit: 2021-05-15 | Discharge: 2021-05-19 | DRG: 377 | Disposition: A | Discharge status: Home Health | Payer: Medicare Other | Attending: Internal Medicine | Admitting: Internal Medicine

## 2021-05-15 ENCOUNTER — Encounter (HOSPITAL_COMMUNITY): Payer: Self-pay

## 2021-05-15 ENCOUNTER — Other Ambulatory Visit: Payer: Self-pay

## 2021-05-15 ENCOUNTER — Emergency Department (HOSPITAL_COMMUNITY): Payer: Medicare Other

## 2021-05-15 DIAGNOSIS — K5730 Diverticulosis of large intestine without perforation or abscess without bleeding: Secondary | ICD-10-CM | POA: Diagnosis not present

## 2021-05-15 DIAGNOSIS — Z794 Long term (current) use of insulin: Secondary | ICD-10-CM | POA: Diagnosis not present

## 2021-05-15 DIAGNOSIS — E11649 Type 2 diabetes mellitus with hypoglycemia without coma: Secondary | ICD-10-CM | POA: Diagnosis present

## 2021-05-15 DIAGNOSIS — K922 Gastrointestinal hemorrhage, unspecified: Secondary | ICD-10-CM

## 2021-05-15 DIAGNOSIS — I120 Hypertensive chronic kidney disease with stage 5 chronic kidney disease or end stage renal disease: Secondary | ICD-10-CM | POA: Diagnosis present

## 2021-05-15 DIAGNOSIS — R55 Syncope and collapse: Secondary | ICD-10-CM

## 2021-05-15 DIAGNOSIS — J9611 Chronic respiratory failure with hypoxia: Secondary | ICD-10-CM | POA: Diagnosis present

## 2021-05-15 DIAGNOSIS — E7800 Pure hypercholesterolemia, unspecified: Secondary | ICD-10-CM | POA: Diagnosis present

## 2021-05-15 DIAGNOSIS — Z79899 Other long term (current) drug therapy: Secondary | ICD-10-CM

## 2021-05-15 DIAGNOSIS — I2510 Atherosclerotic heart disease of native coronary artery without angina pectoris: Secondary | ICD-10-CM | POA: Diagnosis present

## 2021-05-15 DIAGNOSIS — E1122 Type 2 diabetes mellitus with diabetic chronic kidney disease: Secondary | ICD-10-CM | POA: Diagnosis present

## 2021-05-15 DIAGNOSIS — Z87891 Personal history of nicotine dependence: Secondary | ICD-10-CM

## 2021-05-15 DIAGNOSIS — L409 Psoriasis, unspecified: Secondary | ICD-10-CM | POA: Diagnosis present

## 2021-05-15 DIAGNOSIS — Z8719 Personal history of other diseases of the digestive system: Secondary | ICD-10-CM | POA: Diagnosis not present

## 2021-05-15 DIAGNOSIS — Z992 Dependence on renal dialysis: Secondary | ICD-10-CM | POA: Diagnosis not present

## 2021-05-15 DIAGNOSIS — M898X9 Other specified disorders of bone, unspecified site: Secondary | ICD-10-CM | POA: Diagnosis present

## 2021-05-15 DIAGNOSIS — Z888 Allergy status to other drugs, medicaments and biological substances status: Secondary | ICD-10-CM

## 2021-05-15 DIAGNOSIS — I63512 Cerebral infarction due to unspecified occlusion or stenosis of left middle cerebral artery: Secondary | ICD-10-CM

## 2021-05-15 DIAGNOSIS — K219 Gastro-esophageal reflux disease without esophagitis: Secondary | ICD-10-CM | POA: Diagnosis present

## 2021-05-15 DIAGNOSIS — Z955 Presence of coronary angioplasty implant and graft: Secondary | ICD-10-CM | POA: Diagnosis not present

## 2021-05-15 DIAGNOSIS — Z8673 Personal history of transient ischemic attack (TIA), and cerebral infarction without residual deficits: Secondary | ICD-10-CM

## 2021-05-15 DIAGNOSIS — R000 Tachycardia, unspecified: Secondary | ICD-10-CM | POA: Diagnosis not present

## 2021-05-15 DIAGNOSIS — R001 Bradycardia, unspecified: Secondary | ICD-10-CM | POA: Diagnosis not present

## 2021-05-15 DIAGNOSIS — N186 End stage renal disease: Secondary | ICD-10-CM

## 2021-05-15 DIAGNOSIS — K5791 Diverticulosis of intestine, part unspecified, without perforation or abscess with bleeding: Principal | ICD-10-CM | POA: Diagnosis present

## 2021-05-15 DIAGNOSIS — I959 Hypotension, unspecified: Secondary | ICD-10-CM | POA: Diagnosis present

## 2021-05-15 DIAGNOSIS — N250 Renal osteodystrophy: Secondary | ICD-10-CM | POA: Diagnosis not present

## 2021-05-15 DIAGNOSIS — Z7982 Long term (current) use of aspirin: Secondary | ICD-10-CM | POA: Diagnosis not present

## 2021-05-15 DIAGNOSIS — Z7189 Other specified counseling: Secondary | ICD-10-CM | POA: Diagnosis not present

## 2021-05-15 DIAGNOSIS — I25118 Atherosclerotic heart disease of native coronary artery with other forms of angina pectoris: Secondary | ICD-10-CM | POA: Diagnosis not present

## 2021-05-15 DIAGNOSIS — R0689 Other abnormalities of breathing: Secondary | ICD-10-CM | POA: Diagnosis not present

## 2021-05-15 DIAGNOSIS — Z7902 Long term (current) use of antithrombotics/antiplatelets: Secondary | ICD-10-CM

## 2021-05-15 DIAGNOSIS — I639 Cerebral infarction, unspecified: Secondary | ICD-10-CM | POA: Diagnosis present

## 2021-05-15 DIAGNOSIS — E1129 Type 2 diabetes mellitus with other diabetic kidney complication: Secondary | ICD-10-CM | POA: Diagnosis not present

## 2021-05-15 DIAGNOSIS — D500 Iron deficiency anemia secondary to blood loss (chronic): Secondary | ICD-10-CM | POA: Diagnosis present

## 2021-05-15 DIAGNOSIS — Z515 Encounter for palliative care: Secondary | ICD-10-CM

## 2021-05-15 DIAGNOSIS — R739 Hyperglycemia, unspecified: Secondary | ICD-10-CM | POA: Diagnosis not present

## 2021-05-15 DIAGNOSIS — Z9852 Vasectomy status: Secondary | ICD-10-CM

## 2021-05-15 DIAGNOSIS — D631 Anemia in chronic kidney disease: Secondary | ICD-10-CM | POA: Diagnosis present

## 2021-05-15 DIAGNOSIS — Z833 Family history of diabetes mellitus: Secondary | ICD-10-CM

## 2021-05-15 LAB — COMPREHENSIVE METABOLIC PANEL
ALT: 12 U/L (ref 0–44)
AST: 12 U/L — ABNORMAL LOW (ref 15–41)
Albumin: 2.7 g/dL — ABNORMAL LOW (ref 3.5–5.0)
Alkaline Phosphatase: 83 U/L (ref 38–126)
Anion gap: 14 (ref 5–15)
BUN: 62 mg/dL — ABNORMAL HIGH (ref 8–23)
CO2: 27 mmol/L (ref 22–32)
Calcium: 8.2 mg/dL — ABNORMAL LOW (ref 8.9–10.3)
Chloride: 99 mmol/L (ref 98–111)
Creatinine, Ser: 8.05 mg/dL — ABNORMAL HIGH (ref 0.61–1.24)
GFR, Estimated: 6 mL/min — ABNORMAL LOW (ref >60)
Glucose, Bld: 182 mg/dL — ABNORMAL HIGH (ref 70–99)
Potassium: 4.7 mmol/L (ref 3.5–5.1)
Sodium: 140 mmol/L (ref 135–145)
Total Bilirubin: 0.5 mg/dL (ref 0.3–1.2)
Total Protein: 6.1 g/dL — ABNORMAL LOW (ref 6.5–8.1)

## 2021-05-15 LAB — CBC WITH DIFFERENTIAL/PLATELET
Abs Immature Granulocytes: 0.03 10*3/uL (ref 0.00–0.07)
Basophils Absolute: 0 10*3/uL (ref 0.0–0.1)
Basophils Relative: 0 %
Eosinophils Absolute: 0.1 10*3/uL (ref 0.0–0.5)
Eosinophils Relative: 1 %
HCT: 21.2 % — ABNORMAL LOW (ref 39.0–52.0)
Hemoglobin: 7 g/dL — ABNORMAL LOW (ref 13.0–17.0)
Immature Granulocytes: 0 %
Lymphocytes Relative: 10 %
Lymphs Abs: 0.8 10*3/uL (ref 0.7–4.0)
MCH: 32 pg (ref 26.0–34.0)
MCHC: 33 g/dL (ref 30.0–36.0)
MCV: 96.8 fL (ref 80.0–100.0)
Monocytes Absolute: 0.8 10*3/uL (ref 0.1–1.0)
Monocytes Relative: 9 %
Neutro Abs: 6.6 10*3/uL (ref 1.7–7.7)
Neutrophils Relative %: 80 %
Platelets: 265 10*3/uL (ref 150–400)
RBC: 2.19 MIL/uL — ABNORMAL LOW (ref 4.22–5.81)
RDW: 15.3 % (ref 11.5–15.5)
WBC: 8.3 10*3/uL (ref 4.0–10.5)
nRBC: 0 % (ref 0.0–0.2)

## 2021-05-15 LAB — PREPARE RBC (CROSSMATCH)

## 2021-05-15 LAB — HEMOGLOBIN A1C
Hgb A1c MFr Bld: 7 % — ABNORMAL HIGH (ref 4.8–5.6)
Mean Plasma Glucose: 154.2 mg/dL

## 2021-05-15 LAB — GLUCOSE, CAPILLARY: Glucose-Capillary: 178 mg/dL — ABNORMAL HIGH (ref 70–99)

## 2021-05-15 LAB — HEPATITIS B SURFACE ANTIBODY, QUANTITATIVE: Hep B S AB Quant (Post): 100.4 m[IU]/mL (ref >9.9)

## 2021-05-15 IMAGING — DX DG CHEST 1V PORT
1 series · 1 of 1 positions shown · non-contrast
Comparison: AP chest [DATE] and [DATE]

CLINICAL DATA: Syncope. End-stage renal disease. Loss of
consciousness.

EXAM:
PORTABLE CHEST 1 VIEW

[chest ap]
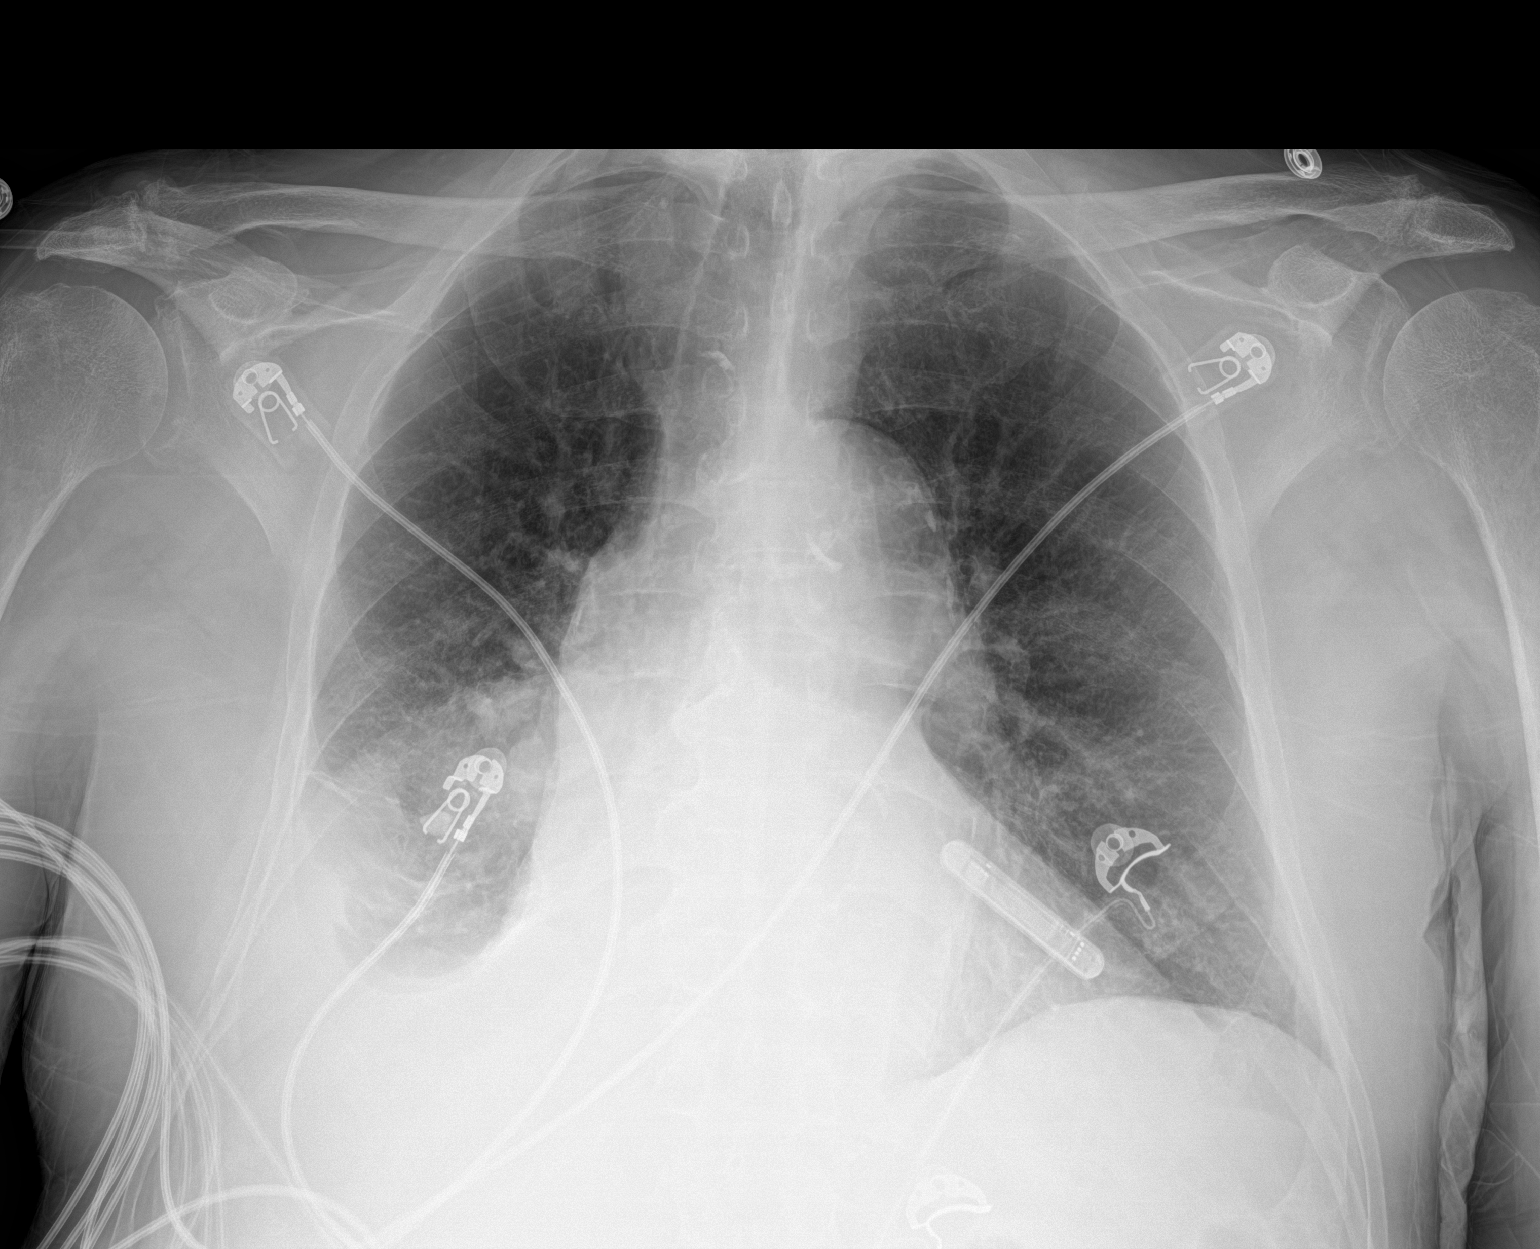

[1 of 1 positions shown; findings below may reference images not displayed]

FINDINGS: Cardiac silhouette is again moderately enlarged. Mediastinal
contours are unchanged and within normal limits. Cardiac loop
recorder again overlies the left hemithorax. Small right pleural
effusion with associated basilar opacification. Increased lucencies
within the upper lungs are again seen suggesting chronic
emphysematous change. The previously questioned nodular right upper
lung density is less apparent and may have previously represented
the head of the right first rib. The current appearance appears very
similar to [DATE] radiograph. The left lung is clear.

Moderate multilevel degenerative bridging osteophytes of the
thoracic spine.
IMPRESSION: Stable small right pleural effusion with right basilar atelectasis.
Cannot exclude underlying pneumonia.

## 2021-05-15 MED ORDER — ACETAMINOPHEN 650 MG RE SUPP: 650.0000 mg | Freq: Four times a day (QID) | RECTAL | Status: DC | PRN

## 2021-05-15 MED ORDER — ONDANSETRON HCL 4 MG PO TABS: 4.0000 mg | ORAL_TABLET | Freq: Four times a day (QID) | ORAL | Status: DC | PRN

## 2021-05-15 MED ORDER — CINACALCET HCL 30 MG PO TABS: 30.0000 mg | ORAL_TABLET | ORAL | Status: DC

## 2021-05-15 MED ORDER — ACETAMINOPHEN 325 MG PO TABS
650.0000 mg | ORAL_TABLET | Freq: Four times a day (QID) | ORAL | Status: DC | PRN
  Administered 2021-05-15: 650 mg via ORAL
  Filled 2021-05-15: qty 2

## 2021-05-15 MED ORDER — PSYLLIUM 95 % PO PACK
1.0000 | PACK | Freq: Two times a day (BID) | ORAL | Status: DC
  Administered 2021-05-16 – 2021-05-18 (×6): 1 via ORAL
  Filled 2021-05-15 (×9): qty 1

## 2021-05-15 MED ORDER — INSULIN ASPART 100 UNIT/ML IJ SOLN
0.0000 [IU] | INTRAMUSCULAR | Status: DC
  Administered 2021-05-16: 3 [IU] via SUBCUTANEOUS
  Administered 2021-05-17: 2 [IU] via SUBCUTANEOUS

## 2021-05-15 MED ORDER — SODIUM CHLORIDE 0.9 % IV SOLN: 10.0000 mL/h | Freq: Once | INTRAVENOUS | Status: DC

## 2021-05-15 MED ORDER — ATORVASTATIN CALCIUM 80 MG PO TABS
80.0000 mg | ORAL_TABLET | Freq: Every day | ORAL | Status: DC
  Administered 2021-05-15 – 2021-05-18 (×4): 80 mg via ORAL
  Filled 2021-05-15 (×4): qty 1

## 2021-05-15 MED ORDER — CINACALCET HCL 30 MG PO TABS
30.0000 mg | ORAL_TABLET | ORAL | Status: DC
  Administered 2021-05-18: 30 mg via ORAL
  Filled 2021-05-15: qty 1

## 2021-05-15 MED ORDER — SODIUM CHLORIDE 0.9% IV SOLUTION: Freq: Once | INTRAVENOUS | Status: AC

## 2021-05-15 MED ORDER — SODIUM CHLORIDE 0.9% FLUSH
3.0000 mL | Freq: Two times a day (BID) | INTRAVENOUS | Status: DC
  Administered 2021-05-15 – 2021-05-19 (×8): 3 mL via INTRAVENOUS

## 2021-05-15 MED ORDER — ONDANSETRON HCL 4 MG/2ML IJ SOLN
4.0000 mg | Freq: Four times a day (QID) | INTRAMUSCULAR | Status: DC | PRN
  Administered 2021-05-15 – 2021-05-16 (×2): 4 mg via INTRAVENOUS
  Filled 2021-05-15 (×2): qty 2

## 2021-05-15 MED ORDER — MELATONIN 3 MG PO TABS
3.0000 mg | ORAL_TABLET | Freq: Once | ORAL | Status: AC
  Administered 2021-05-15: 3 mg via ORAL
  Filled 2021-05-15: qty 1

## 2021-05-15 NOTE — ED Provider Notes (Signed)
?[facility] ?Provider Note ? ? ?CSN: [id number] ?Arrival date & time: 05/15/21  1205 ? ?  ? ?History ? ?Chief Complaint  ?Patient presents with  ? Loss of Consciousness  ? ? ?Nicholas [name] is a 88 y.o. male. ? ?Patient just discharged from the hospital yesterday following GI bleed that is listed as an upper GI bleed.  Patient was transfused blood.  He on Wednesday and he had dialysis on Thursday.  He has been on dialysis for about 5 years he normally is dialyzed Tuesday Thursdays and Saturdays.  Also has type 2 diabetes coronary artery disease and has had a previous stroke.  Patient was on his way to dialysis today when as he approached the car he started feeling very sick described as woozy and family came to him open the door kind and grabbed and then sat him in the car he passed out.  They stated that he was out for about 8 to 10 minutes.  But he was sitting the whole time.  Patient has had a history of passing out in the past.  Is usually briefer than this.  Patient denies any complaints at all now.  However he did say that yesterday he did have 2 bloody bowel movements dark in color and did stain the toilet water all red.  Of importance no vomiting of blood. ? ? ?  ? ?Home Medications ?Prior to Admission medications   ?Medication Sig Start Date End Date Taking? Authorizing Provider  ?acetaminophen (TYLENOL) 500 MG tablet Take 500 mg by mouth 3 (three) times daily as needed for moderate pain.    [provider]  ?aspirin EC 81 MG EC tablet Take 1 tablet (81 mg total) by mouth daily. Swallow whole. 04/18/21 07/17/21  [name], MD  ?atorvastatin (LIPITOR) 80 MG tablet Take 80 mg by mouth at bedtime.    [provider]  ?cinacalcet (SENSIPAR) 30 MG tablet Take by mouth 3 (three) times a week. Harrell Lark, Sat 09/18/20   [provider]  ?clopidogrel (PLAVIX) 75 MG tablet Take 1 tablet (75 mg total) by mouth daily with breakfast. Restart after 5 days  09/18/20   Domenic Polite, MD  ?diphenhydrAMINE (BENADRYL) 25 mg capsule Take 25 mg by mouth daily as needed for itching. 04/06/21   [provider]  ?Ensure Plus (ENSURE PLUS) LIQD Take 237 mLs by mouth 2 (two) times daily between meals. Chocolate    [provider]  ?Fluticasone Propionate,sensor, (ARMONAIR DIGIHALER) 232 MCG/ACT AEPB Inhale 1 puff into the lungs 2 (two) times daily as needed (shortness of breath).    [provider]  ?HYDROcodone-acetaminophen (NORCO/VICODIN) 5-325 MG tablet Take 1 tablet by mouth every 12 (twelve) hours as needed for moderate pain.    [provider]  ?insulin NPH-regular Human (70-30) 100 UNIT/ML injection Inject 10 Units into the skin daily with breakfast. Takes 20 units in the morning if blood sugar is over 140 ?Patient taking differently: Inject 20 Units into the skin See admin instructions. Takes 20 units in the morning if blood sugar is over 140 09/13/20   Domenic Polite, MD  ?midodrine (PROAMATINE) 5 MG tablet Take 1 tablet (5 mg total) by mouth 3 (three) times daily with meals. 04/17/21   [name], MD  ?nitroGLYCERIN (NITROSTAT) 0.4 MG SL tablet Place 1 tablet (0.4 mg total) under the tongue every 5 (five) minutes x 3 doses as needed for chest pain. 08/10/18   [name], MD  ?psyllium (  HYDROCIL/METAMUCIL) 95 % PACK Take 1 packet by mouth daily. 05/14/21   Domenic Polite, MD  ?   ? ?Allergies    ?Hydrochlorothiazide   ? ?Review of Systems   ?Review of Systems  ?Constitutional:  Negative for chills and fever.  ?HENT:  Negative for ear pain and sore throat.   ?Eyes:  Negative for pain and visual disturbance.  ?Respiratory:  Negative for cough and shortness of breath.   ?Cardiovascular:  Negative for chest pain and palpitations.  ?Gastrointestinal:  Positive for blood in stool. Negative for abdominal pain and vomiting.  ?Genitourinary:  Negative for dysuria and hematuria.  ?Musculoskeletal:  Negative for arthralgias and back  pain.  ?Skin:  Negative for color change and rash.  ?Neurological:  Positive for syncope. Negative for seizures.  ?All other systems reviewed and are negative. ? ?Physical Exam ?Updated Vital Signs ?BP 118/61 (BP Location: Right Arm)   Pulse 97   Temp 97.9 ?F (36.6 ?C) (Oral)   Resp 20   Ht 1.727 m ('5\' 8"'$ )   Wt 70.4 kg   SpO2 100%   BMI 23.60 kg/m?  ?Physical Exam ?Vitals and nursing note reviewed.  ?Constitutional:   ?   General: He is not in acute distress. ?   Appearance: Normal appearance. He is well-developed.  ?HENT:  ?   Head: Normocephalic and atraumatic.  ?Eyes:  ?   Extraocular Movements: Extraocular movements intact.  ?   Conjunctiva/sclera: Conjunctivae normal.  ?   Pupils: Pupils are equal, round, and reactive to light.  ?Cardiovascular:  ?   Rate and Rhythm: Normal rate and regular rhythm.  ?   Heart sounds: No murmur heard. ?Pulmonary:  ?   Effort: Pulmonary effort is normal. No respiratory distress.  ?   Breath sounds: Normal breath sounds.  ?Abdominal:  ?   Palpations: Abdomen is soft.  ?   Tenderness: There is no abdominal tenderness.  ?Musculoskeletal:     ?   General: No swelling.  ?   Cervical back: Normal range of motion and neck supple.  ?   Comments: AV fistula left upper extremity  ?Skin: ?   General: Skin is warm and dry.  ?   Capillary Refill: Capillary refill takes less than 2 seconds.  ?Neurological:  ?   Mental Status: He is alert and oriented to person, place, and time. Mental status is at baseline.  ?Psychiatric:     ?   Mood and Affect: Mood normal.  ? ? ?ED Results / Procedures / Treatments   ?Labs ?(all labs ordered are listed, but only abnormal results are displayed) ?Labs Reviewed  ?CBC WITH DIFFERENTIAL/PLATELET  ?COMPREHENSIVE METABOLIC PANEL  ?TYPE AND SCREEN  ? ? ?EKG ?EKG Interpretation ? ?Date/Time:  Saturday May 15 2021 12:15:33 EDT ?Ventricular Rate:  100 ?PR Interval:  196 ?QRS Duration: 109 ?QT Interval:  361 ?QTC Calculation: 466 ?R Axis:   1 ?Text  Interpretation: Sinus tachycardia Low voltage, extremity leads Borderline repolarization abnormality No significant change since last tracing Confirmed by Fredia Sorrow [phone]) on 05/15/2021 12:48:43 PM ? ?Radiology ?No results found. ? ?Procedures ?Procedures  ? ? ?Medications Ordered in ED ?Medications - No data to display ? ?ED Course/ Medical Decision Making/ A&P ?  ?                        ?Medical Decision Making ?Amount and/or Complexity of Data Reviewed ?Labs: ordered. ?Radiology: ordered. ? ? ?Patient with a syncopal  episode.  On his way to dialysis.  The only complicating factor is that he was just discharged for an upper GI bleed.  Had 2 bloody bowel movements yesterday.  One was dark 1 was read so the question is whether he is lost significant mount of blood again.  And there was sort of a vasovagal event due to some hypotension.  At rest patient is completely asymptomatic.  Vital signs are very normal.  Blood pressure 109/51 but he has not been ambulated we will order orthostatic blood pressures.  We will get chest x-ray and labs.  I think if there is any concerns about worsening GI bleeding would probably benefit from readmission. ?He would be a readmission to the hospitalist service. ? ?Final Clinical Impression(s) / ED Diagnoses ?Final diagnoses:  ?Vasovagal syncope  ?ESRD on dialysis [facility])  ?Gastrointestinal hemorrhage, unspecified gastrointestinal hemorrhage type  ? ? ?Rx / DC Orders ?ED Discharge Orders   ? ? None  ? ?  ? ? ?  ?Fredia Sorrow, MD ?05/15/21 1641 ? ?

## 2021-05-15 NOTE — H&P (Signed)
?History and Physical  ? ? ?Nicholas [name] WNU:[id number] DOB: 1930/12/13 DOA: 05/15/2021 ? ?PCP: Windy Fast, MD  ? ?Patient coming from: Home  ? ?Chief Complaint:  syncope  ? ?HPI: Nicholas [name] is a pleasant 88 y.o. male with medical history significant for ESRD on hemodialysis, type 2 diabetes mellitus, coronary artery disease, ischemic left MCA stroke 1 month ago, and recent admission for GI bleeding who was discharged from the hospital yesterday and now returns after syncopal episode.  Patient was seen by GI during the recent admission, bleeding attributed to his pandiverticulosis with recent initiation of DAPT, he was transfused 1 unit, remained stable, and went home yesterday.  He had 2 bloody bowel movements shortly after returning home, no abdominal pain or vomiting, felt well this morning, but became flushed and lightheaded while walking to the car to go to dialysis, and then had a witnessed loss of consciousness lasting several minutes and followed by fatigue.  This is similar to his prior recurrent syncopal episodes for which she was evaluated by EP, noted to have intermittent AV block and sinus bradycardia attributed to hypervagotonia.  ? ?ED Course: Upon arrival to the ED, patient is found to be afebrile and saturating well on his usual 2 L/min of supplemental oxygen with systolic blood pressure in the 90s to low 100s.  Orthostatic vital signs were negative.  EKG with sinus tachycardia, rate 100.  Chest x-ray with no acute findings.  Hemoglobin is 7.0, down from 7.4 yesterday.  1 unit RBC was ordered from the ED. ? ?Review of Systems:  ?All other systems reviewed and apart from HPI, are negative. ? ?Past Medical History:  ?Diagnosis Date  ? Arthritis   ? Chronic kidney disease   ? Diabetes mellitus ([place])   ? Dialysis patient [facility])   ? GERD (gastroesophageal reflux disease)   ? occ  ? Heart murmur   ? Hypercholesteremia   ? Hypertension   ? Psoriasis   ? ? ?Past Surgical History:  ?Procedure Laterality  Date  ? CARDIAC CATHETERIZATION  09  ? "some blockage" with collaterals by cath at [facility] ~ 2009; no intervention required; reportedly, no routine cardiology f/u recommended   ? COLONOSCOPY WITH PROPOFOL N/A 07/06/2020  ? Procedure: COLONOSCOPY WITH PROPOFOL;  Surgeon: Ronald Lobo, MD;  Location: [facility];  Service: Endoscopy;  Laterality: N/A;  ? CORONARY ATHERECTOMY N/A 08/09/2018  ? Procedure: CORONARY ATHERECTOMY;  Surgeon: Jettie Booze, MD;  Location: [place] CV LAB;  Service: Cardiovascular;  Laterality: N/A;  ? CORONARY STENT INTERVENTION N/A 08/09/2018  ? Procedure: CORONARY STENT INTERVENTION;  Surgeon: Jettie Booze, MD;  Location: [place] CV LAB;  Service: Cardiovascular;  Laterality: N/A;  ? ESOPHAGOGASTRODUODENOSCOPY (EGD) WITH PROPOFOL N/A 07/06/2020  ? Procedure: ESOPHAGOGASTRODUODENOSCOPY (EGD) WITH PROPOFOL;  Surgeon: Ronald Lobo, MD;  Location: [facility];  Service: Endoscopy;  Laterality: N/A;  ? INGUINAL HERNIA REPAIR Right 04/13/2012  ? Procedure: HERNIA REPAIR INGUINAL INCARCERATED;  Surgeon: Gayland Curry, MD;  Location: [facility];  Service: General;  Laterality: Right;  ? INSERTION OF MESH Right 04/13/2012  ? Procedure: INSERTION OF MESH;  Surgeon: Gayland Curry, MD;  Location: [place];  Service: General;  Laterality: Right;  ? IR THORACENTESIS ASP PLEURAL SPACE W/IMG GUIDE  06/08/2020  ? LEFT HEART CATH N/A 08/09/2018  ? Procedure: Left Heart Cath;  Surgeon: Jettie Booze, MD;  Location: [place] CV LAB;  Service: Cardiovascular;  Laterality: N/A;  ? LEFT HEART CATH AND CORONARY  ANGIOGRAPHY N/A 08/08/2018  ? Procedure: LEFT HEART CATH AND CORONARY ANGIOGRAPHY;  Surgeon: Troy Sine, MD;  Location: [place] CV LAB;  Service: Cardiovascular;  Laterality: N/A;  ? LEFT HEART CATH AND CORONARY ANGIOGRAPHY N/A 06/05/2020  ? Procedure: LEFT HEART CATH AND CORONARY ANGIOGRAPHY;  Surgeon: Belva Crome, MD;  Location: [facility] CV LAB;  Service: Cardiovascular;   Laterality: N/A;  ? VASECTOMY    ? ? ?Social History:  ? reports that he quit smoking about 34 years ago. His smoking use included cigarettes. He has a 15.00 pack-year smoking history. He has never used smokeless tobacco. He reports that he does not drink alcohol and does not use drugs. ? ?Allergies  ?Allergen Reactions  ? Hydrochlorothiazide Other (See Comments)  ?  Unknown per [facility] records  ? ? ?Family History  ?Problem Relation Age of Onset  ? Diabetes Mother   ? Stroke Father   ? Diabetes Sister   ? Diabetes Brother   ? ? ? ?Prior to Admission medications   ?Medication Sig Start Date End Date Taking? Authorizing Provider  ?acetaminophen (TYLENOL) 500 MG tablet Take 500 mg by mouth 3 (three) times daily as needed for moderate pain.    [provider]  ?aspirin EC 81 MG EC tablet Take 1 tablet (81 mg total) by mouth daily. Swallow whole. 04/18/21 07/17/21  [name], MD  ?atorvastatin (LIPITOR) 80 MG tablet Take 80 mg by mouth at bedtime.    [provider]  ?cinacalcet (SENSIPAR) 30 MG tablet Take by mouth 3 (three) times a week. Harrell Lark, Sat 09/18/20   [provider]  ?clopidogrel (PLAVIX) 75 MG tablet Take 1 tablet (75 mg total) by mouth daily with breakfast. Restart after 5 days 09/18/20   Domenic Polite, MD  ?diphenhydrAMINE (BENADRYL) 25 mg capsule Take 25 mg by mouth daily as needed for itching. 04/06/21   [provider]  ?Ensure Plus (ENSURE PLUS) LIQD Take 237 mLs by mouth 2 (two) times daily between meals. Chocolate    [provider]  ?Fluticasone Propionate,sensor, (ARMONAIR DIGIHALER) 232 MCG/ACT AEPB Inhale 1 puff into the lungs 2 (two) times daily as needed (shortness of breath).    [provider]  ?HYDROcodone-acetaminophen (NORCO/VICODIN) 5-325 MG tablet Take 1 tablet by mouth every 12 (twelve) hours as needed for moderate pain.    [provider]  ?insulin NPH-regular Human (70-30) 100 UNIT/ML injection Inject 10 Units into the  skin daily with breakfast. Takes 20 units in the morning if blood sugar is over 140 ?Patient taking differently: Inject 20 Units into the skin See admin instructions. Takes 20 units in the morning if blood sugar is over 140 09/13/20   Domenic Polite, MD  ?midodrine (PROAMATINE) 5 MG tablet Take 1 tablet (5 mg total) by mouth 3 (three) times daily with meals. 04/17/21   [name], MD  ?nitroGLYCERIN (NITROSTAT) 0.4 MG SL tablet Place 1 tablet (0.4 mg total) under the tongue every 5 (five) minutes x 3 doses as needed for chest pain. 08/10/18   [name], MD  ?psyllium (HYDROCIL/METAMUCIL) 95 % PACK Take 1 packet by mouth daily. 05/14/21   Domenic Polite, MD  ? ? ?Physical Exam: ?Vitals:  ? 05/15/21 1800 05/15/21 1815 05/15/21 1830 05/15/21 1848  ?BP: 109/63 (!) 106/58 (!) 93/54 103/62  ?Pulse:    88  ?Resp: 18 (!) '22 18 20  '$ ?Temp:      ?TempSrc:      ?SpO2:  100%  ?Weight:      ?Height:      ? ? ?Constitutional: NAD, calm  ?Eyes: PERTLA, lids and conjunctivae normal ?ENMT: Mucous membranes are moist. Posterior pharynx clear of any exudate or lesions.   ?Neck: supple, no masses  ?Respiratory: no wheezing, no crackles. No accessory muscle use.  ?Cardiovascular: S1 & S2 heard, regular rate and rhythm. No extremity edema.   ?Abdomen: No distension, no tenderness, soft. Bowel sounds active.  ?Musculoskeletal: no clubbing / cyanosis. No joint deformity upper and lower extremities.   ?Skin: no significant rashes, lesions, ulcers. Warm, dry, well-perfused. ?Neurologic: CN 2-12 grossly intact. Moving all extremities. Alert and oriented.  ?Psychiatric: Very pleasant. Cooperative.  ? ? ?Labs and Imaging on Admission: I have personally reviewed following labs and imaging studies ? ?CBC: ?Recent Labs  ?Lab 05/12/21 ?2102 05/13/21 ?0200 05/13/21 ?0729 05/13/21 ?1643 05/14/21 ?0132 05/15/21 ?9030  ?WBC 8.9  --   --  6.9 6.6 8.3  ?NEUTROABS 6.5  --   --   --   --  6.6  ?HGB 6.6* 6.5* 7.6* 7.5* 7.4* 7.0*  ?HCT 20.6*  20.1* 24.0* 22.9* 21.8* 21.2*  ?MCV 98.1  --   --  94.6 93.6 96.8  ?PLT 307  --   --  257 239 265  ? ?Basic Metabolic Panel: ?Recent Labs  ?Lab 05/12/21 ?2102 05/13/21 ?1643 05/14/21 ?0132 05/15/21 ?0923

## 2021-05-15 NOTE — ED Notes (Signed)
Got patient undressed into a gown on the monitor did ekg shown to Dr Rogene Houston patient is resting with call bell got patient a pillow  ?

## 2021-05-15 NOTE — ED Triage Notes (Signed)
BIB EMS from home for witnessed syncopal episode w/LOC witnessed by family for approx 10-96mn. C/o weakness. Pt reports walking to the car and feeling "woozy," all of a sudden. Pt was here yest for blood in stool  ?

## 2021-05-15 NOTE — ED Notes (Signed)
Patient provided with cranberry and [place] sandwich per verbal order to feed patient from ED MD.  ?

## 2021-05-15 NOTE — ED Provider Notes (Signed)
Care assumed at signout.  In essence this elderly male is presenting 1 day after discharge, now following episode of syncope.  Patient has known GI bleed.  Patient required transfusion given his syncope, will be admitted for monitoring, management.  GI is aware, Dr. Deno Etienne ?Per [facility]:RFVO patient needs a NM GI blood loss scan.. if positive he will need CTA and IR consult. Nothing much to do from GI standpoint as he has diffuse diverticulosis and is the likely cause of GIB. ?  ?Carmin Muskrat, MD ?05/15/21 1910 ? ?

## 2021-05-16 DIAGNOSIS — R55 Syncope and collapse: Secondary | ICD-10-CM | POA: Diagnosis not present

## 2021-05-16 DIAGNOSIS — Z7189 Other specified counseling: Secondary | ICD-10-CM | POA: Diagnosis not present

## 2021-05-16 DIAGNOSIS — R0689 Other abnormalities of breathing: Secondary | ICD-10-CM | POA: Diagnosis not present

## 2021-05-16 DIAGNOSIS — N186 End stage renal disease: Secondary | ICD-10-CM | POA: Diagnosis not present

## 2021-05-16 DIAGNOSIS — K922 Gastrointestinal hemorrhage, unspecified: Secondary | ICD-10-CM | POA: Diagnosis not present

## 2021-05-16 LAB — GLUCOSE, CAPILLARY
Glucose-Capillary: 111 mg/dL — ABNORMAL HIGH (ref 70–99)
Glucose-Capillary: 121 mg/dL — ABNORMAL HIGH (ref 70–99)
Glucose-Capillary: 155 mg/dL — ABNORMAL HIGH (ref 70–99)
Glucose-Capillary: 197 mg/dL — ABNORMAL HIGH (ref 70–99)
Glucose-Capillary: 226 mg/dL — ABNORMAL HIGH (ref 70–99)
Glucose-Capillary: 262 mg/dL — ABNORMAL HIGH (ref 70–99)
Glucose-Capillary: 49 mg/dL — ABNORMAL LOW (ref 70–99)
Glucose-Capillary: 53 mg/dL — ABNORMAL LOW (ref 70–99)

## 2021-05-16 LAB — HEMOGLOBIN AND HEMATOCRIT, BLOOD
HCT: 24.1 % — ABNORMAL LOW (ref 39.0–52.0)
Hemoglobin: 7.7 g/dL — ABNORMAL LOW (ref 13.0–17.0)

## 2021-05-16 LAB — CBC
HCT: 21.8 % — ABNORMAL LOW (ref 39.0–52.0)
HCT: 23.9 % — ABNORMAL LOW (ref 39.0–52.0)
HCT: 23.3 % — ABNORMAL LOW (ref 39.0–52.0)
Hemoglobin: 7.2 g/dL — ABNORMAL LOW (ref 13.0–17.0)
Hemoglobin: 7.8 g/dL — ABNORMAL LOW (ref 13.0–17.0)
Hemoglobin: 7.5 g/dL — ABNORMAL LOW (ref 13.0–17.0)
MCH: 30.8 pg (ref 26.0–34.0)
MCH: 30.7 pg (ref 26.0–34.0)
MCH: 30.1 pg (ref 26.0–34.0)
MCHC: 33 g/dL (ref 30.0–36.0)
MCHC: 32.6 g/dL (ref 30.0–36.0)
MCHC: 32.2 g/dL (ref 30.0–36.0)
MCV: 93.2 fL (ref 80.0–100.0)
MCV: 94.1 fL (ref 80.0–100.0)
MCV: 93.6 fL (ref 80.0–100.0)
Platelets: 222 10*3/uL (ref 150–400)
Platelets: 230 10*3/uL (ref 150–400)
Platelets: 255 10*3/uL (ref 150–400)
RBC: 2.34 MIL/uL — ABNORMAL LOW (ref 4.22–5.81)
RBC: 2.54 MIL/uL — ABNORMAL LOW (ref 4.22–5.81)
RBC: 2.49 MIL/uL — ABNORMAL LOW (ref 4.22–5.81)
RDW: 15.4 % (ref 11.5–15.5)
RDW: 15.9 % — ABNORMAL HIGH (ref 11.5–15.5)
RDW: 15.6 % — ABNORMAL HIGH (ref 11.5–15.5)
WBC: 8.5 10*3/uL (ref 4.0–10.5)
WBC: 8.2 10*3/uL (ref 4.0–10.5)
WBC: 8.7 10*3/uL (ref 4.0–10.5)
nRBC: 0 % (ref 0.0–0.2)
nRBC: 0 % (ref 0.0–0.2)
nRBC: 0 % (ref 0.0–0.2)

## 2021-05-16 LAB — BASIC METABOLIC PANEL
Anion gap: 14 (ref 5–15)
Anion gap: 16 — ABNORMAL HIGH (ref 5–15)
BUN: 71 mg/dL — ABNORMAL HIGH (ref 8–23)
BUN: 82 mg/dL — ABNORMAL HIGH (ref 8–23)
CO2: 26 mmol/L (ref 22–32)
CO2: 24 mmol/L (ref 22–32)
Calcium: 7.8 mg/dL — ABNORMAL LOW (ref 8.9–10.3)
Calcium: 7.7 mg/dL — ABNORMAL LOW (ref 8.9–10.3)
Chloride: 101 mmol/L (ref 98–111)
Chloride: 97 mmol/L — ABNORMAL LOW (ref 98–111)
Creatinine, Ser: 8.61 mg/dL — ABNORMAL HIGH (ref 0.61–1.24)
Creatinine, Ser: 9.44 mg/dL — ABNORMAL HIGH (ref 0.61–1.24)
GFR, Estimated: 5 mL/min — ABNORMAL LOW (ref >60)
GFR, Estimated: 5 mL/min — ABNORMAL LOW (ref >60)
Glucose, Bld: 73 mg/dL (ref 70–99)
Glucose, Bld: 182 mg/dL — ABNORMAL HIGH (ref 70–99)
Potassium: 4.4 mmol/L (ref 3.5–5.1)
Potassium: 5 mmol/L (ref 3.5–5.1)
Sodium: 141 mmol/L (ref 135–145)
Sodium: 137 mmol/L (ref 135–145)

## 2021-05-16 LAB — TYPE AND SCREEN
ABO/RH(D): A NEG
Antibody Screen: NEGATIVE
Unit division: 0

## 2021-05-16 LAB — TROPONIN I (HIGH SENSITIVITY)
Troponin I (High Sensitivity): 459 ng/L (ref <18)
Troponin I (High Sensitivity): 423 ng/L (ref <18)

## 2021-05-16 LAB — BPAM RBC
Blood Product Expiration Date: 202305182359
ISSUE DATE / TIME: 202304222237
Unit Type and Rh: 600

## 2021-05-16 MED ORDER — NITROGLYCERIN 0.4 MG SL SUBL
0.4000 mg | SUBLINGUAL_TABLET | SUBLINGUAL | Status: DC | PRN
  Administered 2021-05-16: 0.4 mg via SUBLINGUAL
  Filled 2021-05-16: qty 1

## 2021-05-16 MED ORDER — ENSURE ENLIVE PO LIQD
237.0000 mL | Freq: Two times a day (BID) | ORAL | Status: DC
  Administered 2021-05-17 – 2021-05-18 (×2): 237 mL via ORAL
  Filled 2021-05-16 (×3): qty 237

## 2021-05-16 MED ORDER — SENNOSIDES-DOCUSATE SODIUM 8.6-50 MG PO TABS
2.0000 | ORAL_TABLET | Freq: Two times a day (BID) | ORAL | Status: DC
  Administered 2021-05-16 – 2021-05-18 (×4): 2 via ORAL
  Filled 2021-05-16 (×5): qty 2

## 2021-05-16 MED ORDER — PANTOPRAZOLE SODIUM 40 MG PO TBEC
40.0000 mg | DELAYED_RELEASE_TABLET | Freq: Every day | ORAL | Status: DC
  Administered 2021-05-16 – 2021-05-19 (×4): 40 mg via ORAL
  Filled 2021-05-16 (×4): qty 1

## 2021-05-16 MED ORDER — MIDODRINE HCL 5 MG PO TABS
10.0000 mg | ORAL_TABLET | Freq: Three times a day (TID) | ORAL | Status: DC
Start: 2021-05-17 — End: 2021-05-17
  Administered 2021-05-17: 5 mg via ORAL
  Filled 2021-05-16 (×2): qty 2

## 2021-05-16 MED ORDER — FLUTICASONE PROPIONATE(SENSOR) 232 MCG/ACT IN AEPB: 1.0000 | INHALATION_SPRAY | Freq: Two times a day (BID) | RESPIRATORY_TRACT | Status: DC | PRN

## 2021-05-16 MED ORDER — BUDESONIDE 0.25 MG/2ML IN SUSP: 0.2500 mg | Freq: Two times a day (BID) | RESPIRATORY_TRACT | Status: DC | PRN

## 2021-05-16 MED ORDER — MIDODRINE HCL 5 MG PO TABS
5.0000 mg | ORAL_TABLET | Freq: Three times a day (TID) | ORAL | Status: DC
  Administered 2021-05-16: 5 mg via ORAL
  Filled 2021-05-16: qty 1

## 2021-05-16 MED ORDER — DIPHENHYDRAMINE HCL 25 MG PO CAPS: 25.0000 mg | ORAL_CAPSULE | Freq: Every day | ORAL | Status: DC | PRN

## 2021-05-16 MED ORDER — HYDROCODONE-ACETAMINOPHEN 5-325 MG PO TABS: 1.0000 | ORAL_TABLET | Freq: Two times a day (BID) | ORAL | Status: DC | PRN

## 2021-05-16 MED ORDER — SODIUM CHLORIDE 0.9 % IV BOLUS
250.0000 mL | Freq: Once | INTRAVENOUS | Status: AC
  Administered 2021-05-16: 250 mL via INTRAVENOUS

## 2021-05-16 MED ORDER — SUCRALFATE 1 GM/10ML PO SUSP
1.0000 g | Freq: Four times a day (QID) | ORAL | Status: DC
Start: 2021-05-16 — End: 2021-05-19
  Administered 2021-05-16 – 2021-05-19 (×10): 1 g via ORAL
  Filled 2021-05-16 (×10): qty 10

## 2021-05-16 MED ORDER — INSULIN ASPART PROT & ASPART (70-30 MIX) 100 UNIT/ML ~~LOC~~ SUSP
10.0000 [IU] | Freq: Two times a day (BID) | SUBCUTANEOUS | Status: DC
  Filled 2021-05-16: qty 10

## 2021-05-16 MED ORDER — DEXTROSE 50 % IV SOLN
INTRAVENOUS | Status: AC
  Administered 2021-05-16: 25 mL
  Filled 2021-05-16: qty 50

## 2021-05-16 NOTE — Progress Notes (Signed)
Patient complains of chest discomfort this am at 0830am. Stated at home he takes [facility] for this type of pain. Nitro x1 given with relief. 2nd episode of CP this PM. MD notified both times. CP protocol followed.  ?

## 2021-05-16 NOTE — Consult Note (Signed)
? ?                                                                                ?Consultation Note ?Date: 05/16/2021  ? ?Patient Name: Nicholas [name]  ?DOB: 19-Feb-1930  MRN: [id number]  Age / Sex: 88 y.o., male  ?PCP: Windy Fast, MD ?Referring Physician: Hosie Poisson, MD ? ?Reason for Consultation:  goals of care ? ?HPI/Patient Profile: 88 y.o. male  with past medical history of ESRD on HD, DM, history of stroke one month ago (started on DAPT), CAD, recent hospitalization and discharge yesterday for GI bleeding (attributed to pan diverticulosis and initiation of DAPT) admitted on 05/15/2021 with syncopal episode.  He has required transfusion of one unit of blood. Palliative medicine consulted for [place].  ? ?Primary Decision Maker ?PATIENT ? ?Discussion: ?Chart reviewed including labs, progress notes, imaging.  ?Met with patient and his son [name] at bedside.  ?Patient lives at home with his spouse and son [name]. He has several other children. Being social, and being with his family bring him joy.  ?He acknowledges that he is not able to function the way that he used to. However, he feels he currently is maintaining a good quality of life. He ambulates in his home using a rolling walker, and uses a wheelchair when he goes out.  ?We reviewed his chronic and current medical issues.  ?His primary goal of care is to return home with his family and spouse and be able to continue to interact with them.  ?Code status was discussed- he is very clear that he would like CPR and he has discussed this with his family. We discussed that CPR may not accomplish his goal of being able to interact with family. Encouraged patient/family to consider DNR/DNI status understanding evidenced based poor outcomes in similar hospitalized patients, as the cause of the arrest is likely associated with chronic/terminal disease rather than a reversible acute cardio-pulmonary event.  ?Mr. Lex does have a living will and his family knows where it  is located.  ?I encouraged Mr. Geffre to bring a copy of his living will for his medical records so that his medical providers can know his wishes as well.  ?Mr. Makki and his son had questions about the plan of care for him and patient's diet.  ? ?SUMMARY OF RECOMMENDATIONS ?-ESRD on HD, GI bleeding- GOC are to stabilize patient so that he can return home to his family- long term goals of care are to be able to be awake and alert and interact with his family ?-Advanced care planning- full scope, full code- patient has living will- requested that he provide a copy for his medical record ?   ? ?Code Status/Advance Care Planning: ?Full code ? ? ?Prognosis:   ?Unable to determine ? ?Discharge Planning: To Be Determined ? ?Primary Diagnoses: ?Present on Admission: ? Acute GI bleeding ? CAD (coronary artery disease) ? Type II diabetes mellitus with renal manifestations ([place]) ? Stroke [facility]) ? Syncope, vasovagal ? Chronic respiratory insufficiency ? ? ?Review of Systems  ?Constitutional:  Positive for fatigue.  ?Respiratory:  Negative for shortness of breath.   ? ?Physical Exam ?Vitals and nursing note reviewed.  ?  Pulmonary:  ?   Effort: Pulmonary effort is normal.  ?Neurological:  ?   Mental Status: He is alert and oriented to person, place, and time.  ?Psychiatric:     ?   Mood and Affect: Mood normal.     ?   Behavior: Behavior normal.  ? ? ?Vital Signs: BP (!) 100/41 (BP Location: Right Leg)   Pulse 81   Temp 98.1 ?F (36.7 ?C) (Oral)   Resp 15   Ht _0  (1.727 m)   Wt 69.5 kg   SpO2 100%   BMI 23.30 kg/m?  ?Pain Scale: 0-10 ?  ?Pain Score: 3  ? ? ?SpO2: SpO2: 100 % ?O2 Device:SpO2: 100 % ?O2 Flow Rate: .O2 Flow Rate (L/min): 2 L/min ? ?IO: Intake/output summary:  ?Intake/Output Summary (Last 24 hours) at 05/16/2021 1622 ?Last data filed at 05/16/2021 0300 ?Gross per 24 hour  ?Intake 325 ml  ?Output --  ?Net 325 ml  ? ? ?LBM: Last BM Date : 05/14/21 ?Baseline Weight: Weight: 70.4 kg ?Most recent weight: Weight:  69.5 kg     ?P ? ? ?Thank you for this consult. Palliative medicine will continue to follow and assist as needed.  ? ? ?Signed by: ?Mariana Kaufman, AGNP-C ?Palliative Medicine ? ?  ?Please contact Palliative Medicine Team phone at [phone] for questions and concerns.  ?For individual provider: See Amion ? ? ? ? ? ? ? ? ? ? ? ? ? ? ?

## 2021-05-16 NOTE — Plan of Care (Signed)

## 2021-05-16 NOTE — Progress Notes (Signed)
Patient had a CBG of 49. 2 glasses of juice given. Rechecked and was 47. Hypoglycemic protocol followed. Recheck 191.  ?

## 2021-05-16 NOTE — Progress Notes (Signed)
? ? ? [facility] ?                                                                            ? ? ?Nicholas [name], is a 88 y.o. male, DOB - Sep 04, 1930, JGG:[id number] ?Admit date - 05/15/2021    ?Outpatient Primary MD for the patient is Windy Fast, MD ? ?LOS - 1  days ? ? ? ?Brief summary  ? ?Nicholas [name] is a pleasant 89 y.o. male with medical history significant for ESRD on hemodialysis, type 2 diabetes mellitus, coronary artery disease, ischemic left MCA stroke 1 month ago, and recent admission for GI bleeding who was discharged from the hospital yesterday and now returns after syncopal episode.  Patient was seen by GI during the recent admission, bleeding attributed to his pandiverticulosis with recent initiation of DAPT, he was transfused 1 unit, remained stable, and went home  .  He had 2 bloody bowel movements shortly after returning home, no abdominal pain or vomiting, felt well , but became flushed and lightheaded while walking to the car to go to dialysis, and then had a witnessed loss of consciousness lasting several minutes and followed by fatigue. ? ? ?Assessment & Plan  ? ? ?Assessment and Plan: ? ?Syncope probably secondary to vasovagal episode ?-EKG is normal sinus rhythm ?-No arrhythmias  on telemetry ?-Orthostatic vital signs are negative at this time. ?-His last echocardiogram last month reviewed, no indication to repeat it at this time. ? ? ? ?GI bleeding probably secondary to pandiverticulosis.  Patient currently on Plavix after CVA last month.  Patient reports having 2 bloody bowel movements at home after being discharged.  Hemoglobin was 7 underwent 1 unit of PRBC transfusion and improved to 7.7. ?ED [facility] physician spoke with Dr. Therisa Doyne and if patient bleeds again to get NM GI blood loss scan and possible CTA with IR consultation. ? ? ? ?End-stage renal disease on dialysis on Tuesdays Thursdays and Saturdays. ?Patient missed his dialysis on Saturday.  He appears to be stable  electrolytes within normal limits. ?We will request nephrology consult in a.m. for maintenance HD. ? ? ? ?Type 2 diabetes mellitus insulin-dependent with hypoglycemia ?Uncontrolled with hypoglycemia and hyperglycemia.  ?Last A1c 7.6% in March 2023.CBG (last 3)  ?Recent Labs  ?  05/16/21 ?0827 05/16/21 ?1211 05/16/21 ?1655  ?GLUCAP 121* 197* 155*  ? ?Resume SSI. Hols the 70/30 mmhg.  ? ? ? ? ?Hx of recent CVA:  ?Holding plavix, continue with lipitor.  ? ? ?CAD:  ?Continue with statin.  ?No chest pain or sob.  ? ? ? ? ?Estimated body mass index is 23.3 kg/m? as calculated from the following: ?  Height as of this encounter: '5\' 8"'$  (1.727 m). ?  Weight as of this encounter: 69.5 kg. ? ?Code Status: Full Code. ?DVT Prophylaxis:  SCDs Start: 05/15/21 1955 ? ? ?Level of Care: Level of care: Telemetry Medical ?Family Communication: discussed with son over the phone.  ? ?Disposition Plan:     Remains inpatient appropriate:  gi bleed ? ?Procedures:  ?None  ? ?Consultants:   ?None. ? ?Antimicrobials:  ? ?Anti-infectives (From admission, onward)  ? ? None  ? ?  ? ? ? ?  Medications ? ?Scheduled Meds: ? atorvastatin  80 mg Oral QHS  ? [START ON 05/18/2021] cinacalcet  30 mg Oral Q T,Th,Sa-HD  ? feeding supplement  237 mL Oral BID BM  ? insulin aspart  0-6 Units Subcutaneous Q4H  ? insulin aspart protamine- aspart  10 Units Subcutaneous BID WC  ? midodrine  5 mg Oral TID WC  ? pantoprazole  40 mg Oral Q0600  ? psyllium  1 packet Oral BID  ? senna-docusate  2 tablet Oral BID  ? sodium chloride flush  3 mL Intravenous Q12H  ? ?Continuous Infusions: ? sodium chloride    ? ?PRN Meds:.acetaminophen **OR** acetaminophen, budesonide, diphenhydrAMINE, HYDROcodone-acetaminophen, nitroGLYCERIN, ondansetron **OR** ondansetron (ZOFRAN) IV ? ? ? ?Subjective:  ? ?Nicholas [name] was seen and examined today.  Chest pain resolved.  ? ?Objective:  ? ?Vitals:  ? 05/16/21 0800 05/16/21 0827 05/16/21 0830 05/16/21 1504  ?BP: (!) 94/55 (!) 107/42 (!) 120/57  (!) 100/41  ?Pulse:    81  ?Resp: 17 (!) '21 12 15  '$ ?Temp:    98.1 ?F (36.7 ?C)  ?TempSrc:    Oral  ?SpO2:      ?Weight:      ?Height:      ? ? ?Intake/Output Summary (Last 24 hours) at 05/16/2021 1648 ?Last data filed at 05/16/2021 0300 ?Gross per 24 hour  ?Intake 325 ml  ?Output --  ?Net 325 ml  ? ?Filed Weights  ? 05/15/21 1219 05/15/21 2053  ?Weight: 70.4 kg 69.5 kg  ? ? ? ?Exam ?General exam: Appears calm and comfortable  ?Respiratory system: Clear to auscultation. Respiratory effort normal. ?Cardiovascular system: S1 & S2 heard, RRR. No JVD, No pedal edema. ?Gastrointestinal system: Abdomen is nondistended, soft and nontender. Normal bowel sounds heard. ?Central nervous system: Alert and oriented. No focal neurological deficits. ?Extremities: Symmetric 5 x 5 power. ?Skin: No rashes, lesions or ulcers ?Psychiatry: Mood & affect appropriate.  ? ? ?Data Reviewed:  I have personally reviewed following labs and imaging studies ? ? ?CBC ?Lab Results  ?Component Value Date  ? WBC 8.5 05/16/2021  ? RBC 2.34 (L) 05/16/2021  ? HGB 7.7 (L) 05/16/2021  ? HCT 24.1 (L) 05/16/2021  ? MCV 93.2 05/16/2021  ? MCH 30.8 05/16/2021  ? PLT 222 05/16/2021  ? MCHC 33.0 05/16/2021  ? RDW 15.4 05/16/2021  ? LYMPHSABS 0.8 05/15/2021  ? MONOABS 0.8 05/15/2021  ? EOSABS 0.1 05/15/2021  ? BASOSABS 0.0 05/15/2021  ? ? ? ?Last metabolic panel ?Lab Results  ?Component Value Date  ? NA 141 05/16/2021  ? K 4.4 05/16/2021  ? CL 101 05/16/2021  ? CO2 26 05/16/2021  ? BUN 71 (H) 05/16/2021  ? CREATININE 8.61 (H) 05/16/2021  ? GLUCOSE 73 05/16/2021  ? GFRNONAA 5 (L) 05/16/2021  ? GFRAA 8 (L) 03/20/2019  ? CALCIUM 7.8 (L) 05/16/2021  ? PHOS 5.6 (H) 05/13/2021  ? PROT 6.1 (L) 05/15/2021  ? ALBUMIN 2.7 (L) 05/15/2021  ? BILITOT 0.5 05/15/2021  ? ALKPHOS 83 05/15/2021  ? AST 12 (L) 05/15/2021  ? ALT 12 05/15/2021  ? ANIONGAP 14 05/16/2021  ? ? ?CBG (last 3)  ?Recent Labs  ?  05/16/21 ?0801 05/16/21 ?0827 05/16/21 ?1211  ?GLUCAP 49* 121* 197*  ?   ? ? ?Coagulation Profile: ?No results for input(s): INR, PROTIME in the last 168 hours. ? ? ?Radiology Studies: ?DG Chest Port 1 View ? ?Result Date: 05/15/2021 ?CLINICAL DATA:  Syncope. End-stage renal disease. Loss of consciousness. EXAM:  PORTABLE CHEST 1 VIEW COMPARISON:  AP chest 04/13/2021 and 09/09/2020 FINDINGS: Cardiac silhouette is again moderately enlarged. Mediastinal contours are unchanged and within normal limits. Cardiac loop recorder again overlies the left hemithorax. Small right pleural effusion with associated basilar opacification. Increased lucencies within the upper lungs are again seen suggesting chronic emphysematous change. The previously questioned nodular right upper lung density is less apparent and may have previously represented the head of the right first rib. The current appearance appears very similar to 09/09/2020 radiograph. The left lung is clear. Moderate multilevel degenerative bridging osteophytes of the thoracic spine. IMPRESSION: Stable small right pleural effusion with right basilar atelectasis. Cannot exclude underlying pneumonia. Electronically Signed   By: Yvonne Kendall M.D.   On: 05/15/2021 17:14   ? ? ? ? ?Hosie Poisson M.D. ?[facility] ?05/16/2021, 4:48 PM ? ?Available via Epic secure chat 7am-7pm ?After 7 pm, please refer to night coverage provider listed on amion. ? ? ? ?

## 2021-05-16 NOTE — Progress Notes (Signed)
Trops are 459. On call MD aware, please see orders. No chest pain at this time. Vitals stable. ?

## 2021-05-17 ENCOUNTER — Inpatient Hospital Stay (HOSPITAL_COMMUNITY): Payer: Medicare Other

## 2021-05-17 DIAGNOSIS — R0689 Other abnormalities of breathing: Secondary | ICD-10-CM | POA: Diagnosis not present

## 2021-05-17 DIAGNOSIS — N186 End stage renal disease: Secondary | ICD-10-CM | POA: Diagnosis not present

## 2021-05-17 DIAGNOSIS — K922 Gastrointestinal hemorrhage, unspecified: Secondary | ICD-10-CM | POA: Diagnosis not present

## 2021-05-17 DIAGNOSIS — I25118 Atherosclerotic heart disease of native coronary artery with other forms of angina pectoris: Secondary | ICD-10-CM | POA: Diagnosis not present

## 2021-05-17 DIAGNOSIS — R55 Syncope and collapse: Secondary | ICD-10-CM | POA: Diagnosis not present

## 2021-05-17 LAB — RENAL FUNCTION PANEL
Albumin: 2.6 g/dL — ABNORMAL LOW (ref 3.5–5.0)
Albumin: 2.6 g/dL — ABNORMAL LOW (ref 3.5–5.0)
Anion gap: 18 — ABNORMAL HIGH (ref 5–15)
Anion gap: 15 (ref 5–15)
BUN: 86 mg/dL — ABNORMAL HIGH (ref 8–23)
BUN: 67 mg/dL — ABNORMAL HIGH (ref 8–23)
CO2: 22 mmol/L (ref 22–32)
CO2: 24 mmol/L (ref 22–32)
Calcium: 7.7 mg/dL — ABNORMAL LOW (ref 8.9–10.3)
Calcium: 7.9 mg/dL — ABNORMAL LOW (ref 8.9–10.3)
Chloride: 99 mmol/L (ref 98–111)
Chloride: 99 mmol/L (ref 98–111)
Creatinine, Ser: 10.5 mg/dL — ABNORMAL HIGH (ref 0.61–1.24)
Creatinine, Ser: 7.96 mg/dL — ABNORMAL HIGH (ref 0.61–1.24)
GFR, Estimated: 4 mL/min — ABNORMAL LOW (ref >60)
GFR, Estimated: 6 mL/min — ABNORMAL LOW (ref >60)
Glucose, Bld: 255 mg/dL — ABNORMAL HIGH (ref 70–99)
Glucose, Bld: 84 mg/dL (ref 70–99)
Phosphorus: 6 mg/dL — ABNORMAL HIGH (ref 2.5–4.6)
Phosphorus: 4.4 mg/dL (ref 2.5–4.6)
Potassium: 5 mmol/L (ref 3.5–5.1)
Potassium: 4.3 mmol/L (ref 3.5–5.1)
Sodium: 139 mmol/L (ref 135–145)
Sodium: 138 mmol/L (ref 135–145)

## 2021-05-17 LAB — CBC
HCT: 25.2 % — ABNORMAL LOW (ref 39.0–52.0)
HCT: 23.7 % — ABNORMAL LOW (ref 39.0–52.0)
Hemoglobin: 8.1 g/dL — ABNORMAL LOW (ref 13.0–17.0)
Hemoglobin: 7.7 g/dL — ABNORMAL LOW (ref 13.0–17.0)
MCH: 30.3 pg (ref 26.0–34.0)
MCH: 30.2 pg (ref 26.0–34.0)
MCHC: 32.1 g/dL (ref 30.0–36.0)
MCHC: 32.5 g/dL (ref 30.0–36.0)
MCV: 94.4 fL (ref 80.0–100.0)
MCV: 92.9 fL (ref 80.0–100.0)
Platelets: 256 10*3/uL (ref 150–400)
Platelets: 301 10*3/uL (ref 150–400)
RBC: 2.67 MIL/uL — ABNORMAL LOW (ref 4.22–5.81)
RBC: 2.55 MIL/uL — ABNORMAL LOW (ref 4.22–5.81)
RDW: 15.5 % (ref 11.5–15.5)
RDW: 15.5 % (ref 11.5–15.5)
WBC: 9.4 10*3/uL (ref 4.0–10.5)
WBC: 7.2 10*3/uL (ref 4.0–10.5)
nRBC: 0 % (ref 0.0–0.2)
nRBC: 0 % (ref 0.0–0.2)

## 2021-05-17 LAB — GLUCOSE, CAPILLARY
Glucose-Capillary: 136 mg/dL — ABNORMAL HIGH (ref 70–99)
Glucose-Capillary: 137 mg/dL — ABNORMAL HIGH (ref 70–99)
Glucose-Capillary: 137 mg/dL — ABNORMAL HIGH (ref 70–99)
Glucose-Capillary: 170 mg/dL — ABNORMAL HIGH (ref 70–99)
Glucose-Capillary: 209 mg/dL — ABNORMAL HIGH (ref 70–99)

## 2021-05-17 LAB — HEPATITIS B SURFACE ANTIGEN: Hepatitis B Surface Ag: NONREACTIVE

## 2021-05-17 LAB — TROPONIN I (HIGH SENSITIVITY)
Troponin I (High Sensitivity): 437 ng/L (ref <18)
Troponin I (High Sensitivity): 383 ng/L (ref <18)
Troponin I (High Sensitivity): 385 ng/L (ref <18)

## 2021-05-17 LAB — HEPATITIS B SURFACE ANTIBODY,QUALITATIVE: Hep B S Ab: REACTIVE — AB

## 2021-05-17 IMAGING — NM NM GI BLOOD LOSS
2 series · 12 of 12 positions shown · non-contrast
Comparison: CT AP [DATE]

CLINICAL DATA: History of diverticulitis and in stage renal disease
on dialysis. Recent admission for GI bleed.

EXAM:
NUCLEAR MEDICINE GASTROINTESTINAL BLEEDING SCAN
TECHNIQUE: Sequential abdominal images were obtained following intravenous
administration of [7F] labeled red blood cells.
RADIOPHARMACEUTICALS:  26.2 mCi [7F] pertechnetate in-vitro
labeled red cells.

[Series 1: gi bleed · 4.14mm/px · 6 of 60 frames shown (1 of 2)]
[frame 6/60]
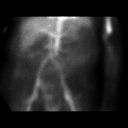
[frame 16/60]
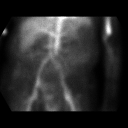
[frame 26/60]
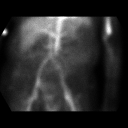
[frame 36/60]
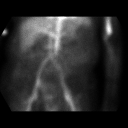
[frame 46/60]
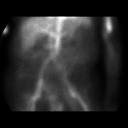
[frame 56/60]
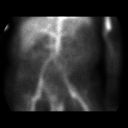

[Series 2: gi bleed · 4.14mm/px · 6 of 60 frames shown (2 of 2)]
[frame 6/60]
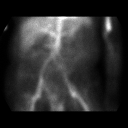
[frame 16/60]
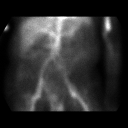
[frame 26/60]
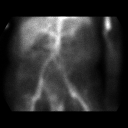
[frame 36/60]
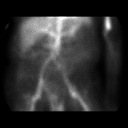
[frame 46/60]
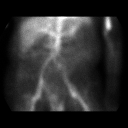
[frame 56/60]
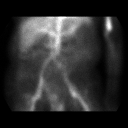

[12 of 12 positions shown; findings below may reference images not displayed]

FINDINGS: Dynamic imaging was performed for 120 minutes. No abnormal foci of
increased radiotracer uptake is identified to suggest active GI
bleed.
IMPRESSION: No signs to suggest active GI bleed.

## 2021-05-17 MED ORDER — SODIUM CHLORIDE 0.9 % IV SOLN: 100.0000 mL | INTRAVENOUS | Status: DC | PRN

## 2021-05-17 MED ORDER — INSULIN ASPART PROT & ASPART (70-30 MIX) 100 UNIT/ML ~~LOC~~ SUSP
6.0000 [IU] | Freq: Two times a day (BID) | SUBCUTANEOUS | Status: DC
  Administered 2021-05-18 – 2021-05-19 (×2): 6 [IU] via SUBCUTANEOUS
  Filled 2021-05-17: qty 10

## 2021-05-17 MED ORDER — LIDOCAINE HCL (PF) 1 % IJ SOLN: 5.0000 mL | INTRAMUSCULAR | Status: DC | PRN

## 2021-05-17 MED ORDER — ALTEPLASE 2 MG IJ SOLR: 2.0000 mg | Freq: Once | INTRAMUSCULAR | Status: DC | PRN

## 2021-05-17 MED ORDER — MIDODRINE HCL 5 MG PO TABS
5.0000 mg | ORAL_TABLET | Freq: Three times a day (TID) | ORAL | Status: DC
Start: 2021-05-17 — End: 2021-05-19
  Administered 2021-05-17 – 2021-05-19 (×6): 5 mg via ORAL
  Filled 2021-05-17 (×5): qty 1

## 2021-05-17 MED ORDER — PENTAFLUOROPROP-TETRAFLUOROETH EX AERO: 1.0000 "application " | INHALATION_SPRAY | CUTANEOUS | Status: DC | PRN

## 2021-05-17 MED ORDER — LIDOCAINE-PRILOCAINE 2.5-2.5 % EX CREA: 1.0000 "application " | TOPICAL_CREAM | CUTANEOUS | Status: DC | PRN

## 2021-05-17 MED ORDER — HEPARIN SODIUM (PORCINE) 1000 UNIT/ML DIALYSIS: 1000.0000 [IU] | INTRAMUSCULAR | Status: DC | PRN

## 2021-05-17 MED ORDER — TECHNETIUM TC 99M-LABELED RED BLOOD CELLS IV KIT
25.0000 | PACK | Freq: Once | INTRAVENOUS | Status: AC | PRN
  Administered 2021-05-17: 25 via INTRAVENOUS

## 2021-05-17 NOTE — Evaluation (Signed)
Occupational Therapy Evaluation ?Patient Details ?Name: Nicholas [name] ?MRN: [id number] ?DOB: 1930-02-09 ?Today's Date: 05/17/2021 ? ? ?History of Present Illness 88 y.o. M admitted on 4/19 with acute lower GI bleed acute blood loss anemia. Transfused 1 unit of PRBC.   PMH - recent lt CVA, HTN, GERD, Arthritis, DM2, ESRD on dialysis.  ? ?Clinical Impression ?  ?This 88 yo male admitted with above presents to acute OT with PLOF of not doing IADLs (wife does them, "bless her") and occasionally assist him with LB ADLs. He will continue to benefit from acute OT without need for follow up).  ?   ? ?Recommendations for follow up therapy are one component of a multi-disciplinary discharge planning process, led by the attending physician.  Recommendations may be updated based on patient status, additional functional criteria and insurance authorization.  ? ?Follow Up Recommendations ? No OT follow up  ?  ?Assistance Recommended at Discharge Intermittent Supervision/Assistance  ?Patient can return home with the following Assistance with cooking/housework;Assist for transportation;Direct supervision/assist for medications management ? ?  ?Functional Status Assessment ? Patient has had a recent decline in their functional status and demonstrates the ability to make significant improvements in function in a reasonable and predictable amount of time.  ?Equipment Recommendations ? None recommended by OT  ?  ?   ?Precautions / Restrictions Precautions ?Precautions: Fall ?Restrictions ?Weight Bearing Restrictions: No  ? ?  ? ?Mobility Bed Mobility ?  ?  ?  ?  ?  ?  ?  ?General bed mobility comments: pt up in recliner upon arrival ?  ? ?Transfers ?Overall transfer level: Needs assistance ?Equipment used: Rolling walker (2 wheels) ?Transfers: Sit to/from Stand ?Sit to Stand: Min guard ?  ?  ?  ?  ?  ?  ?  ? ?  ?Balance Overall balance assessment: Needs assistance ?Sitting-balance support: No upper extremity supported, Feet  supported ?Sitting balance-Leahy Scale: Good ?  ?  ?Standing balance support: No upper extremity supported ?Standing balance-Leahy Scale: Fair ?  ?  ?  ?  ?  ?  ?  ?  ?  ?  ?  ?  ?   ? ?ADL either performed or assessed with clinical judgement  ? ?ADL Overall ADL's : Needs assistance/impaired ?Eating/Feeding: Independent;Sitting ?  ?Grooming: Set up;Sitting ?  ?Upper Body Bathing: Set up;Sitting ?  ?Lower Body Bathing: Min guard;Sit to/from stand ?  ?Upper Body Dressing : Set up;Sitting ?  ?Lower Body Dressing: Min guard;Sit to/from stand ?  ?Toilet Transfer: Min guard ?  ?Toileting- Water quality scientist and Hygiene: Min guard;Sit to/from stand ?  ?  ?  ?  ?   ? ? ? ?Vision Ability to See in Adequate Light: 0 Adequate ?Patient Visual Report: No change from baseline ?   ?   ?   ?   ? ?Pertinent Vitals/Pain Pain Assessment ?Pain Assessment: No/denies pain  ? ? ? ?Hand Dominance Right ?  ?Extremity/Trunk Assessment Upper Extremity Assessment ?Upper Extremity Assessment: Overall WFL for tasks assessed ?  ?  ?  ?  ?  ?Communication Communication ?Communication: No difficulties ?  ?Cognition Arousal/Alertness: Awake/alert ?Behavior During Therapy: [facility] for tasks assessed/performed ?Overall Cognitive Status: Within Functional Limits for tasks assessed ?  ?  ?  ?  ?  ?  ?  ?  ?  ?  ?  ?  ?  ?  ?  ?  ?  ?  ?  ?   ?   ?   ? ? ?  Home Living Family/patient expects to be discharged to:: Private residence ?Living Arrangements: Spouse/significant other;Children ?Available Help at Discharge: Family;Available 24 hours/day ?Type of Home: House ?Home Access: Stairs to enter ?Entrance Stairs-Number of Steps: 1 ?Entrance Stairs-Rails: None ?Home Layout: Two level;Bed/bath upstairs ?Alternate Level Stairs-Number of Steps: stair lift ?Alternate Level Stairs-Rails: Can reach both ?Bathroom Shower/Tub: Walk-in shower ?  ?Bathroom Toilet: Handicapped height ?Bathroom Accessibility: Yes ?How Accessible: Accessible via walker ?Home Equipment:  Kasandra Knudsen - single point;Shower seat - built in;Wheelchair - Publishing copy (2 wheels);Grab bars - tub/shower;Hand held shower head ?  ?Additional Comments: reports 2L O2 at home ?  ? ?  ?Prior Functioning/Environment Prior Level of Function : Independent/Modified Independent ?  ?  ?  ?  ?  ?  ?Mobility Comments: Uses rolling walker ?ADLs Comments: Indep most days, says he sometimes needs assist with LB ADLs ?  ? ?  ?  ?OT Problem List: Impaired balance (sitting and/or standing) ?  ?   ?OT Treatment/Interventions: Self-care/ADL training;Patient/family education;Balance training  ?  ?OT Goals(Current goals can be found in the care plan section) Acute Rehab OT Goals ?Patient Stated Goal: to feel better and go home ?OT Goal Formulation: With patient ?Time For Goal Achievement: 05/31/21 ?Potential to Achieve Goals: Good  ?OT Frequency: Min 2X/week ?  ? ?   ?AM-PAC OT "6 Clicks" Daily Activity     ?Outcome Measure Help from another person eating meals?: None ?Help from another person taking care of personal grooming?: A Little ?Help from another person toileting, which includes using toliet, bedpan, or urinal?: A Little ?Help from another person bathing (including washing, rinsing, drying)?: A Little ?Help from another person to put on and taking off regular upper body clothing?: A Little ?Help from another person to put on and taking off regular lower body clothing?: A Little ?6 Click Score: 19 ?  ?End of Session Equipment Utilized During Treatment: Oxygen (2 liters) ? ?Activity Tolerance: Patient tolerated treatment well ?Patient left: in chair;with call bell/phone within reach ? ?OT Visit Diagnosis: Unsteadiness on feet (R26.81)  ?              ?Time: 4854-6270 ?OT Time Calculation (min): 25 min ?Charges:  OT General Charges ?$OT Visit: 1 Visit ?OT Evaluation ?$OT Eval Moderate Complexity: 1 Mod ?OT Treatments ?$Self Care/Home Management : 8-22 mins ? ?[place] Circle, OTR/L ?Acute Rehab Services ?Pager  [phone] ?Office [phone] ? ? ? ?Almon Register ?05/17/2021, 1:02 PM ?

## 2021-05-17 NOTE — Progress Notes (Signed)
? ? ? [facility] ?                                                                            ? ? ?Nicholas [name], is a 88 y.o. male, DOB - 1930-11-16, SHF:[id number] ?Admit date - 05/15/2021    ?Outpatient Primary MD for the patient is Windy Fast, MD ? ?LOS - 2  days ? ? ? ?Brief summary  ? ?Nicholas [name] is a pleasant 88 y.o. male with medical history significant for ESRD on hemodialysis, type 2 diabetes mellitus, coronary artery disease, ischemic left MCA stroke 1 month ago, and recent admission for GI bleeding who was discharged from the hospital yesterday and now returns after syncopal episode.  Patient was seen by GI during the recent admission, bleeding attributed to his pandiverticulosis with recent initiation of DAPT, he was transfused 1 unit, remained stable, and went home  .  He had 2 bloody bowel movements shortly after returning home, no abdominal pain or vomiting, felt well , but became flushed and lightheaded while walking to the car to go to dialysis, and then had a witnessed loss of consciousness lasting several minutes and followed by fatigue. ? ? ?Assessment & Plan  ? ? ?Assessment and Plan: ? ?Syncope probably secondary to vasovagal episode ?-EKG is normal sinus rhythm ?-No arrhythmias  on telemetry ?-Orthostatic vital signs are negative at this time. ?-His last echocardiogram last month reviewed, no indication to repeat it at this time. ? ? ? ?GI bleeding probably secondary to pandiverticulosis.  Patient currently on Plavix after CVA last month.  Patient reports having 2 bloody bowel movements at home after being discharged.  Hemoglobin was 7 underwent 1 unit of PRBC transfusion and improved to 7.7 decreased to 7.5.  ?One BM this am, with some blood, will get NM RBC scan and possible CTA with IR consultation if positive.  ? ? ? ?End-stage renal disease on dialysis on Tuesdays Thursdays and Saturdays. ?Patient missed his dialysis on Saturday.  He appears to be stable, electrolytes within  normal limits. ?Nephrology on board. ? ? ? ?Type 2 diabetes mellitus insulin-dependent with hypoglycemia ?Uncontrolled with hypoglycemia and hyperglycemia.  ?Last A1c 7.6% in March 2023.CBG (last 3)  ?CBG (last 3)  ?Recent Labs  ?  05/17/21 ?0433 05/17/21 ?0747 05/17/21 ?1129  ?GLUCAP 137* 136* 209*  ? ?No hypoglycemic episodes since yesterday, will restart his home novolog 70/30 mmhg.  ? ? ? ? ?Hx of recent left MCA infarct ? Was on DAPT , on hold for GI bleed.  ? ? ?CAD:  ?Continue with statin.  ? ? ?Chest pain intermittent yesterday, requesting for NTG. At rest non radiating, sub sternal burning pain, spontaneously resolved, and also with NTG.  ?Ordered sucralfate and Protonix with some improvement.  ?EKG shows NSR, no ischemic changes, troponin elevated.  ?Elevated troponins from ?demand ischemia from blood loss and hypotension and ESRD.  ?Last echocardiogram reviewed.  ?Will request cardiology to see if he needs further ischemic work up.  ? ? ? ?Estimated body mass index is 23.3 kg/m? as calculated from the following: ?  Height as of this encounter: '5\' 8"'$  (1.727 m). ?  Weight as of this encounter: 69.5  kg. ? ?Code Status: Full Code. ?DVT Prophylaxis:  SCDs Start: 05/15/21 1955 ? ? ?Level of Care: Level of care: Telemetry Medical ?Family Communication: discussed with son over the phone.  ? ?Disposition Plan:     Remains inpatient appropriate:  gi bleed ? ?Procedures:  ?NM RBC scan.  ? ?Consultants:   ?Gastroenterology ?Nephrology ?Cardiology ?Palliative care.  ? ?Antimicrobials:  ? ?Anti-infectives (From admission, onward)  ? ? None  ? ?  ? ? ? ?Medications ? ?Scheduled Meds: ? atorvastatin  80 mg Oral QHS  ? [START ON 05/18/2021] cinacalcet  30 mg Oral Q T,Th,Sa-HD  ? feeding supplement  237 mL Oral BID BM  ? insulin aspart  0-6 Units Subcutaneous Q4H  ? midodrine  5 mg Oral TID WC  ? pantoprazole  40 mg Oral Q0600  ? psyllium  1 packet Oral BID  ? senna-docusate  2 tablet Oral BID  ? sodium chloride flush  3 mL  Intravenous Q12H  ? sucralfate  1 g Oral Q6H  ? ?Continuous Infusions: ? sodium chloride    ? sodium chloride    ? sodium chloride    ? sodium chloride    ? sodium chloride    ? ?PRN Meds:.sodium chloride, sodium chloride, sodium chloride, sodium chloride, acetaminophen **OR** acetaminophen, alteplase, budesonide, diphenhydrAMINE, heparin, HYDROcodone-acetaminophen, lidocaine (PF), lidocaine-prilocaine, nitroGLYCERIN, ondansetron **OR** ondansetron (ZOFRAN) IV, pentafluoroprop-tetrafluoroeth ? ? ? ?Subjective:  ? ?Nicholas [name] was seen and examined today.  Wants to have HD done today.  ?No chest pain this morning. No sob. Some blood in the BM. ? ?Objective:  ? ?Vitals:  ? 05/16/21 1959 05/16/21 2011 05/17/21 0436 05/17/21 0740  ?BP: 124/90  (!) 114/55 120/61  ?Pulse: 88  84 87  ?Resp: '18  17 18  '$ ?Temp:  97.7 ?F (36.5 ?C) 98.3 ?F (36.8 ?C) 98.1 ?F (36.7 ?C)  ?TempSrc:  Oral Oral Oral  ?SpO2:   100% 98%  ?Weight:      ?Height:      ? ? ?Intake/Output Summary (Last 24 hours) at 05/17/2021 1228 ?Last data filed at 05/17/2021 0815 ?Gross per 24 hour  ?Intake 290 ml  ?Output 0 ml  ?Net 290 ml  ? ? ?Filed Weights  ? 05/15/21 1219 05/15/21 2053  ?Weight: 70.4 kg 69.5 kg  ? ? ? ?Exam ?General exam: Appears calm and comfortable  ?Respiratory system: Clear to auscultation. Respiratory effort normal. ?Cardiovascular system: S1 & S2 heard, RRR. No JVD, ?Gastrointestinal system: Abdomen is nondistended, soft and nontender. Normal bowel sounds heard. ?Central nervous system: Alert and oriented. No focal neurological deficits. ?Extremities: Symmetric 5 x 5 power. ?Skin: No rashes, lesions or ulcers ?Psychiatry:  Mood & affect appropriate.  ? ? ? ?Data Reviewed:  I have personally reviewed following labs and imaging studies ? ? ?CBC ?Lab Results  ?Component Value Date  ? WBC 9.4 05/17/2021  ? RBC 2.67 (L) 05/17/2021  ? HGB 8.1 (L) 05/17/2021  ? HCT 25.2 (L) 05/17/2021  ? MCV 94.4 05/17/2021  ? MCH 30.3 05/17/2021  ? PLT 256 05/17/2021   ? MCHC 32.1 05/17/2021  ? RDW 15.5 05/17/2021  ? LYMPHSABS 0.8 05/15/2021  ? MONOABS 0.8 05/15/2021  ? EOSABS 0.1 05/15/2021  ? BASOSABS 0.0 05/15/2021  ? ? ? ?Last metabolic panel ?Lab Results  ?Component Value Date  ? NA 139 05/17/2021  ? K 5.0 05/17/2021  ? CL 99 05/17/2021  ? CO2 22 05/17/2021  ? BUN 86 (H) 05/17/2021  ? CREATININE 10.50 (H)  05/17/2021  ? GLUCOSE 255 (H) 05/17/2021  ? GFRNONAA 4 (L) 05/17/2021  ? GFRAA 8 (L) 03/20/2019  ? CALCIUM 7.7 (L) 05/17/2021  ? PHOS 6.0 (H) 05/17/2021  ? PROT 6.1 (L) 05/15/2021  ? ALBUMIN 2.6 (L) 05/17/2021  ? BILITOT 0.5 05/15/2021  ? ALKPHOS 83 05/15/2021  ? AST 12 (L) 05/15/2021  ? ALT 12 05/15/2021  ? ANIONGAP 18 (H) 05/17/2021  ? ? ?CBG (last 3)  ?Recent Labs  ?  05/17/21 ?0433 05/17/21 ?0747 05/17/21 ?1129  ?GLUCAP 137* 136* 209*  ? ?  ? ? ?Coagulation Profile: ?No results for input(s): INR, PROTIME in the last 168 hours. ? ? ?Radiology Studies: ?DG Chest Port 1 View ? ?Result Date: 05/15/2021 ?CLINICAL DATA:  Syncope. End-stage renal disease. Loss of consciousness. EXAM: PORTABLE CHEST 1 VIEW COMPARISON:  AP chest 04/13/2021 and 09/09/2020 FINDINGS: Cardiac silhouette is again moderately enlarged. Mediastinal contours are unchanged and within normal limits. Cardiac loop recorder again overlies the left hemithorax. Small right pleural effusion with associated basilar opacification. Increased lucencies within the upper lungs are again seen suggesting chronic emphysematous change. The previously questioned nodular right upper lung density is less apparent and may have previously represented the head of the right first rib. The current appearance appears very similar to 09/09/2020 radiograph. The left lung is clear. Moderate multilevel degenerative bridging osteophytes of the thoracic spine. IMPRESSION: Stable small right pleural effusion with right basilar atelectasis. Cannot exclude underlying pneumonia. Electronically Signed   By: Yvonne Kendall M.D.   On: 05/15/2021  17:14   ? ? ? ? ?Hosie Poisson M.D. ?[facility] ?05/17/2021, 12:28 PM ? ?Available via Epic secure chat 7am-7pm ?After 7 pm, please refer to night coverage provider listed on amion. ? ? ? ?

## 2021-05-17 NOTE — Evaluation (Signed)
Physical Therapy Evaluation ?Patient Details ?Name: Nicholas [name] ?MRN: [id number] ?DOB: August 14, 1930 ?Today's Date: 05/17/2021 ? ?History of Present Illness ? Pt is an 88 y.o. male recently admitted 4/19-4/21/23- with lower GIB, now readmitted 05/15/21 with syncopal episode (LOC lasting several minutes), bloody BM. Per workup, suspect syncope related to vasovagal. Pt had GI bleeding scan 4/24, no signs to suggest active GIB. PMH includes ischemic L MCA stroke (03/2021), ESRD (HD TTS), DM2, CAD, arthritis. ?  ?Clinical Impression ? Pt presents with an overall decrease in functional mobility secondary to above. PTA, pt typically mod indep household ambulator with RW, lives with wife who assists with ADL/iADLs as needed; pt reports starting with HHPT after recent admission. Pt declines OOB mobility despite max encouragement, reports wanting to wait until after HD. Educ re: activity recommendations, energy conservation strategies (handout provided) and importance of mobility; requested mobility specialists add to caseload. Pt would benefit from continued acute PT services to maximize functional mobility and independence prior to d/c with continued HHPT services.     ? ?Recommendations for follow up therapy are one component of a multi-disciplinary discharge planning process, led by the attending physician.  Recommendations may be updated based on patient status, additional functional criteria and insurance authorization. ? ?Follow Up Recommendations Home health PT ? ?  ?Assistance Recommended at Discharge Intermittent Supervision/Assistance  ?Patient can return home with the following ? A little help with walking and/or transfers;Help with stairs or ramp for entrance;Assist for transportation;A little help with bathing/dressing/bathroom ? ?  ?Equipment Recommendations None recommended by PT  ?Recommendations for Other Services ?    ?  ?Functional Status Assessment Patient has had a recent decline in their functional status and  demonstrates the ability to make significant improvements in function in a reasonable and predictable amount of time.  ? ?  ?Precautions / Restrictions Precautions ?Precautions: Fall;Other (comment) ?Precaution Comments: h/o syncopal episode leading to admission ?Restrictions ?Weight Bearing Restrictions: No  ? ?  ? ?Mobility ? Bed Mobility ?Overal bed mobility: Modified Independent ?  ?  ?  ?  ?  ?  ?General bed mobility comments: mod indep to reposition in bed, pt adamantly declining sitting EOB or OOB mobility despite encouragement ?  ? ?Transfers ?  ?  ?  ?  ?  ?  ?  ?  ?  ?  ?  ? ?Ambulation/Gait ?  ?  ?  ?  ?  ?  ?  ?  ? ?Stairs ?  ?  ?  ?  ?  ? ?Wheelchair Mobility ?  ? ?Modified Rankin (Stroke Patients Only) ?  ? ?  ? ?Balance Overall balance assessment: Needs assistance ?  ?  ?  ?  ?  ?  ?  ?  ?  ?  ?  ?  ?  ?  ?  ?  ?  ?  ?   ? ? ? ?Pertinent Vitals/Pain Pain Assessment ?Pain Assessment: No/denies pain  ? ? ?Home Living Family/patient expects to be discharged to:: Private residence ?Living Arrangements: Spouse/significant other;Children ?Available Help at Discharge: Family;Available 24 hours/day ?Type of Home: House ?Home Access: Stairs to enter ?Entrance Stairs-Rails: None ?Entrance Stairs-Number of Steps: 1 ?Alternate Level Stairs-Number of Steps: stair lift ?Home Layout: Two level;Bed/bath upstairs ?Home Equipment: Kasandra Knudsen - single point;Shower seat - built in;Wheelchair - Publishing copy (2 wheels);Grab bars - tub/shower;Hand held shower head ?Additional Comments: Wears 2L O2 baseline  ?  ?Prior Function Prior Level of Function : Needs assist ?  ?  ?  ?  ?  ?  ?  Mobility Comments: Mod indep limited ambulator with RW. Reports having started with HHPT since recent admission ?ADLs Comments: Indep most days, says he sometimes needs assist with LB ADLs; wife assists with ADL/iADLs as needed ?  ? ? ?Hand Dominance  ? Dominant Hand: Right ? ?  ?Extremity/Trunk Assessment  ? Upper Extremity Assessment ?Upper  Extremity Assessment: Overall WFL for tasks assessed ?  ? ?Lower Extremity Assessment ?Lower Extremity Assessment: Generalized weakness ?  ? ?   ?Communication  ? Communication: No difficulties  ?Cognition Arousal/Alertness: Awake/alert ?Behavior During Therapy: [facility] for tasks assessed/performed ?Overall Cognitive Status: Within Functional Limits for tasks assessed ?  ?  ?  ?  ?  ?  ?  ?  ?  ?  ?  ?  ?  ?  ?  ?  ?  ?  ?  ? ?  ?General Comments General comments (skin integrity, edema, etc.): educ pt re: activity recommendations, importance of mobility, energy conservation strategies (handout provided) ? ?  ?Exercises    ? ?Assessment/Plan  ?  ?PT Assessment Patient needs continued PT services  ?PT Problem List Decreased strength;Decreased activity tolerance;Decreased balance;Decreased mobility;Cardiopulmonary status limiting activity ? ?   ?  ?PT Treatment Interventions DME instruction;Gait training;Functional mobility training;Therapeutic activities;Therapeutic exercise;Balance training;Patient/family education   ? ?PT Goals (Current goals can be found in the Care Plan section)  ?Acute Rehab PT Goals ?Patient Stated Goal: return home, figure out what is wrong ? ?  ?Frequency Min 3X/week ?  ? ? ?Co-evaluation   ?  ?  ?  ?  ? ? ?  ?AM-PAC PT "6 Clicks" Mobility  ?Outcome Measure Help needed turning from your back to your side while in a flat bed without using bedrails?: None ?Help needed moving from lying on your back to sitting on the side of a flat bed without using bedrails?: None ?Help needed moving to and from a bed to a chair (including a wheelchair)?: A Little ?Help needed standing up from a chair using your arms (e.g., wheelchair or bedside chair)?: A Little ?Help needed to walk in hospital room?: A Little ?Help needed climbing 3-5 steps with a railing? : A Little ?6 Click Score: 20 ? ?  ?End of Session Equipment Utilized During Treatment: Oxygen ?Activity Tolerance: Patient limited by fatigue ?Patient left: in  bed;with call bell/phone within reach;with bed alarm set ?Nurse Communication: Mobility status ?PT Visit Diagnosis: Other abnormalities of gait and mobility (R26.89) ?  ? ?Time: 8756-4332 ?PT Time Calculation (min) (ACUTE ONLY): 17 min ? ? ?Charges:   PT Evaluation ?$PT Eval Moderate Complexity: 1 Mod ?  ?  ?   ?Mabeline Caras, PT, DPT ?Acute Rehabilitation Services  ?Pager [phone] ?Office [phone] ? ?Derry Lory ?05/17/2021, 4:25 PM ? ?

## 2021-05-17 NOTE — Progress Notes (Signed)
?[facility] ?Progress Note  ? ?Subjective:    ?Returned to ED on 4/22 after DC 4/21 for syncope and ongoing concern for LGIB ?Last HD was 4/20 ?K 5.0, BUN 86 ?Hb now 8.1 has req transfusion ?Nuc Med bleeding scan negative earlier today ? ? ?Objective ?Vitals:  ? 05/16/21 1959 05/16/21 2011 05/17/21 0436 05/17/21 0740  ?BP: 124/90  (!) 114/55 120/61  ?Pulse: 88  84 87  ?Resp: '18  17 18  '$ ?Temp:  97.7 ?F (36.5 ?C) 98.3 ?F (36.8 ?C) 98.1 ?F (36.7 ?C)  ?TempSrc:  Oral Oral Oral  ?SpO2:   100% 98%  ?Weight:      ?Height:      ? ?Physical Exam ?General:well appearing, pleasant male in NAD ?Heart:RRR, no mrg ?Lungs:CTAB, nml WOB on 2L O2 via [place] ?Abdomen:soft, NTND ?Extremities:no LE edema ?Dialysis Access: LU AVF +b/t  ? ?Filed Weights  ? 05/15/21 1219 05/15/21 2053  ?Weight: 70.4 kg 69.5 kg  ? ? ?Intake/Output Summary (Last 24 hours) at 05/17/2021 1153 ?Last data filed at 05/16/2021 2200 ?Gross per 24 hour  ?Intake 240 ml  ?Output 0 ml  ?Net 240 ml  ? ? ? ?Additional Objective ?Labs: ?Basic Metabolic Panel: ?Recent Labs  ?Lab 05/13/21 ?1643 05/14/21 ?0132 05/16/21 ?0426 05/16/21 ?2235 05/17/21 ?1022  ?NA  --    < > 141 137 139  ?K  --    < > 4.4 5.0 5.0  ?CL  --    < > 101 97* 99  ?CO2  --    < > '26 24 22  '$ ?GLUCOSE  --    < > 73 182* 255*  ?BUN  --    < > 71* 82* 86*  ?CREATININE  --    < > 8.61* 9.44* 10.50*  ?CALCIUM  --    < > 7.8* 7.7* 7.7*  ?PHOS 5.6*  --   --   --  6.0*  ? < > = values in this interval not displayed.  ? ? ?Liver Function Tests: ?Recent Labs  ?Lab 05/12/21 ?2102 05/15/21 ?1634 05/17/21 ?1022  ?AST 14* 12*  --   ?ALT 13 12  --   ?ALKPHOS 91 83  --   ?BILITOT 0.3 0.5  --   ?PROT 6.8 6.1*  --   ?ALBUMIN 2.9* 2.7* 2.6*  ? ? ?CBC: ?Recent Labs  ?Lab 05/12/21 ?2102 05/13/21 ?0200 05/15/21 ?1634 05/16/21 ?0426 05/16/21 ?6712 05/16/21 ?1655 05/16/21 ?2235 05/17/21 ?1022  ?WBC 8.9   < > 8.3 8.5  --  8.2 8.7 9.4  ?NEUTROABS 6.5  --  6.6  --   --   --   --   --   ?HGB 6.6*   < > 7.0* 7.2*   < >  7.8* 7.5* 8.1*  ?HCT 20.6*   < > 21.2* 21.8*   < > 23.9* 23.3* 25.2*  ?MCV 98.1   < > 96.8 93.2  --  94.1 93.6 94.4  ?PLT 307   < > 265 222  --  230 255 256  ? < > = values in this interval not displayed.  ? ? ?CBG: ?Recent Labs  ?Lab 05/16/21 ?2009 05/17/21 ?0000 05/17/21 ?4580 05/17/21 ?0747 05/17/21 ?1129  ?GLUCAP 226* 170* 137* 136* 209*  ? ? ?Iron Studies:  ? Latest Reference Range & Units 05/13/21 16:43  ?Saturation Ratios 17.9 - 39.5 % 25  ?Ferritin 24 - 336 ng/mL 1,064 (H)  ?(H): Data is abnormally high ? ?Lab Results  ?Component  Value Date  ? INR 1.0 09/09/2020  ? INR 1.0 03/18/2019  ? INR 1.0 09/27/2018  ? ?Studies/Results: ?DG Chest Port 1 View ? ?Result Date: 05/15/2021 ?CLINICAL DATA:  Syncope. End-stage renal disease. Loss of consciousness. EXAM: PORTABLE CHEST 1 VIEW COMPARISON:  AP chest 04/13/2021 and 09/09/2020 FINDINGS: Cardiac silhouette is again moderately enlarged. Mediastinal contours are unchanged and within normal limits. Cardiac loop recorder again overlies the left hemithorax. Small right pleural effusion with associated basilar opacification. Increased lucencies within the upper lungs are again seen suggesting chronic emphysematous change. The previously questioned nodular right upper lung density is less apparent and may have previously represented the head of the right first rib. The current appearance appears very similar to 09/09/2020 radiograph. The left lung is clear. Moderate multilevel degenerative bridging osteophytes of the thoracic spine. IMPRESSION: Stable small right pleural effusion with right basilar atelectasis. Cannot exclude underlying pneumonia. Electronically Signed   By: Yvonne Kendall M.D.   On: 05/15/2021 17:14   ? ?Medications: ? sodium chloride    ? sodium chloride    ? sodium chloride    ? ? atorvastatin  80 mg Oral QHS  ? [START ON 05/18/2021] cinacalcet  30 mg Oral Q T,Th,Sa-HD  ? feeding supplement  237 mL Oral BID BM  ? insulin aspart  0-6 Units Subcutaneous Q4H   ? midodrine  5 mg Oral TID WC  ? pantoprazole  40 mg Oral Q0600  ? psyllium  1 packet Oral BID  ? senna-docusate  2 tablet Oral BID  ? sodium chloride flush  3 mL Intravenous Q12H  ? sucralfate  1 g Oral Q6H  ? ? ?Dialysis Orders: ?TTS [facility] [phone], ext [phone]) ?   3h  70kg  3K/2.5 bath  400/500  LUA AVF  Hep none ? - last OP Hb was 9.2 on 4/06 ? - not on esa/ fe ? - no vdra ?  ?Home meds include - asa, lipitor, sensipar 30 tts, plavix, ensure plus, armonair, norco prn, 70-30 insulin qam, midodrine 5 tid, sl ntg, prns/ vits/ supps ?  ?Assessment/ Plan: ?GI bleed - recent workup.  Readmit w/ melena, ongoing concern after DC.  Neg Nuc med GI scan earlier today 4/24. Per primary GI ?Recent acute CVA - w/ R sided weakness ?ESRD - on HD TTS. Missed 4/22, off schedule today. Short Tx tom back on schedule.   ?Hypotension/volume - No gross volume on exam. Gets midodrine 5 mg tid for BP support.  UF as tolerated.  ?Anemia esrd - not on esa at OP unit, Transfuse Hb < 7.  Hgb improved to 7.4 s/p 1 unit pRBC.  Give ESA tomorrow with HD.   ?MBD ckd - CCa in range, phos mildly elevated. Cont sensipar. Not on binders. ?DM - on insulin ?Nutrition - Renal diet w/fluid restriction. Alb 2.6 +protein supplements.  ? ?Rexene Agent, MD  ?[facility] ?05/17/2021,11:53 AM ? LOS: 2 days  ? ? ?

## 2021-05-17 NOTE — Consult Note (Signed)
?Cardiology Consultation:  ? ?Patient ID: Nicholas [name] ?MRN: [id number]; DOB: 07-28-1930 ? ?Admit date: 05/15/2021 ?Date of Consult: 05/17/2021 ? ?PCP:  Windy Fast, MD ?  ?[facility] Providers ?Cardiologist:  Sinclair Grooms, MD  ?Electrophysiologist:  Virl Axe, MD     ? ? ?Patient Profile:  ? ?Nicholas [name] is a 88 y.o. male with a hx of ESRD on dialysis, recurrent syncope s/p ILR, CAD, history of cardiomyopathy with improved EF, type II diabetes, anemia, COPD who is being seen 05/17/2021 for the evaluation of chest pain at the request of Dr. Karleen Hampshire. ? ?History of Present Illness:  ? ?Mr. Moffa reports occasional intermittent chest discomfort at home. He is minimally active but has dyspnea on exertion as well. He also reports history of GERD. When he has either chest pain or dyspnea on exertion, he takes a nitroglycerin and the pain resolves. ? ?Yesterday he had his typical chest discomfort. He asked for a nitro pill but trialed other recommended treatments first. When this did not improve his pain, he again asked for a nitro. This instantly resolved his pain. He denies shortness of breath, diaphoresis, or nausea at the time. The pain was no more intense than it typically is at home. He has not had any additional chest pain since that time.  ? ?hsTn checked, elevated on initial at 459, downtrended. Prior hsTn 14 one month ago. He does have known CAD. Last cath 05/2020, recommended for medical management. ? ?Recently treated for GI bleed. ? ?Past Medical History:  ?Diagnosis Date  ? Arthritis   ? Chronic kidney disease   ? Diabetes mellitus ([place])   ? Dialysis patient [facility], [place])   ? GERD (gastroesophageal reflux disease)   ? occ  ? Heart murmur   ? Hypercholesteremia   ? Hypertension   ? Psoriasis   ? ? ?Past Surgical History:  ?Procedure Laterality Date  ? CARDIAC CATHETERIZATION  09  ? "some blockage" with collaterals by cath at [facility] ~ 2009; no intervention required; reportedly, no routine cardiology f/u  recommended   ? COLONOSCOPY WITH PROPOFOL N/A 07/06/2020  ? Procedure: COLONOSCOPY WITH PROPOFOL;  Surgeon: Ronald Lobo, MD;  Location: [place];  Service: Endoscopy;  Laterality: N/A;  ? CORONARY ATHERECTOMY N/A 08/09/2018  ? Procedure: CORONARY ATHERECTOMY;  Surgeon: Jettie Booze, MD;  Location: [place] CV LAB;  Service: Cardiovascular;  Laterality: N/A;  ? CORONARY STENT INTERVENTION N/A 08/09/2018  ? Procedure: CORONARY STENT INTERVENTION;  Surgeon: Jettie Booze, MD;  Location: [place] [facility];  Service: Cardiovascular;  Laterality: N/A;  ? ESOPHAGOGASTRODUODENOSCOPY (EGD) WITH PROPOFOL N/A 07/06/2020  ? Procedure: ESOPHAGOGASTRODUODENOSCOPY (EGD) WITH PROPOFOL;  Surgeon: Ronald Lobo, MD;  Location: [place];  Service: Endoscopy;  Laterality: N/A;  ? INGUINAL HERNIA REPAIR Right 04/13/2012  ? Procedure: HERNIA REPAIR INGUINAL INCARCERATED;  Surgeon: Gayland Curry, MD;  Location: [place];  Service: General;  Laterality: Right;  ? INSERTION OF MESH Right 04/13/2012  ? Procedure: INSERTION OF MESH;  Surgeon: Gayland Curry, MD;  Location: [place];  Service: General;  Laterality: Right;  ? IR THORACENTESIS ASP PLEURAL SPACE W/IMG GUIDE  06/08/2020  ? LEFT HEART CATH N/A 08/09/2018  ? Procedure: Left Heart Cath;  Surgeon: Jettie Booze, MD;  Location: [place] [facility];  Service: Cardiovascular;  Laterality: N/A;  ? LEFT HEART CATH AND CORONARY ANGIOGRAPHY N/A 08/08/2018  ? Procedure: LEFT HEART CATH AND CORONARY ANGIOGRAPHY;  Surgeon: Troy Sine, MD;  Location: [facility]  INVASIVE CV LAB;  Service: Cardiovascular;  Laterality: N/A;  ? LEFT HEART CATH AND CORONARY ANGIOGRAPHY N/A 06/05/2020  ? Procedure: LEFT HEART CATH AND CORONARY ANGIOGRAPHY;  Surgeon: Belva Crome, MD;  Location: [place] [facility];  Service: Cardiovascular;  Laterality: N/A;  ? VASECTOMY    ?  ? ?Home Medications:  ?Prior to Admission medications   ?Medication Sig Start Date End Date Taking? Authorizing Provider   ?acetaminophen (TYLENOL) 500 MG tablet Take 500 mg by mouth 3 (three) times daily as needed for mild pain, moderate pain or headache.   Yes [provider]  ?albuterol (VENTOLIN HFA) 108 (90 Base) MCG/ACT inhaler Inhale 2 puffs into the lungs 4 (four) times daily as needed for shortness of breath.   Yes [provider]  ?aspirin EC 81 MG EC tablet Take 1 tablet (81 mg total) by mouth daily. Swallow whole. 04/18/21 07/17/21 Yes [name], MD  ?atorvastatin (LIPITOR) 80 MG tablet Take 80 mg by mouth at bedtime.   Yes [provider]  ?cinacalcet (SENSIPAR) 30 MG tablet Take 30 mg by mouth See admin instructions. Take 30 mg by mouth prior to dialysis on Tues/Thurs/Sat 09/18/20  Yes [provider]  ?clopidogrel (PLAVIX) 75 MG tablet Take 1 tablet (75 mg total) by mouth daily with breakfast. Restart after 5 days ?Patient taking differently: Take 75 mg by mouth daily with breakfast. 09/18/20  Yes Domenic Polite, MD  ?diphenhydrAMINE (BENADRYL) 25 mg capsule Take 25 mg by mouth daily as needed for itching. 04/06/21  Yes [provider]  ?Ensure Plus (ENSURE PLUS) LIQD Take 237 mLs by mouth 2 (two) times daily between meals. Chocolate   Yes [provider]  ?HYDROcodone-acetaminophen (NORCO/VICODIN) 5-325 MG tablet Take 1 tablet by mouth every 12 (twelve) hours as needed for moderate pain.   Yes [provider]  ?insulin NPH-regular Human (70-30) 100 UNIT/ML injection Inject 10 Units into the skin daily with breakfast. Takes 20 units in the morning if blood sugar is over 140 ?Patient taking differently: Inject 20 Units into the skin See admin instructions. Inject 20 units into the skin in the morning if blood sugar is over 140 09/13/20  Yes Domenic Polite, MD  ?midodrine (PROAMATINE) 5 MG tablet Take 1 tablet (5 mg total) by mouth 3 (three) times daily with meals. 04/17/21  Yes [name], MD  ?mometasone (ASMANEX, 60 METERED DOSES,) 220 MCG/ACT inhaler  Inhale 2 puffs into the lungs at bedtime as needed (for flares).   Yes [provider]  ?nitroGLYCERIN (NITROSTAT) 0.4 MG SL tablet Place 1 tablet (0.4 mg total) under the tongue every 5 (five) minutes x 3 doses as needed for chest pain. 08/10/18  Yes [name], MD  ?Fluticasone Propionate,sensor, (ARMONAIR DIGIHALER) 232 MCG/ACT AEPB Inhale 1 puff into the lungs 2 (two) times daily as needed (shortness of breath). ?Patient not taking: Reported on 05/16/2021    [provider]  ?psyllium (HYDROCIL/METAMUCIL) 95 % PACK Take 1 packet by mouth daily. ?Patient not taking: Reported on 05/16/2021 05/14/21   Domenic Polite, MD  ? ? ?Inpatient Medications: ?Scheduled Meds: ? atorvastatin  80 mg Oral QHS  ? [START ON 05/18/2021] cinacalcet  30 mg Oral Q T,Th,Sa-HD  ? feeding supplement  237 mL Oral BID BM  ? insulin aspart  0-6 Units Subcutaneous Q4H  ? insulin aspart protamine- aspart  6 Units Subcutaneous BID WC  ? midodrine  5 mg Oral TID WC  ? pantoprazole  40 mg Oral  [id number]  ? psyllium  1 packet Oral BID  ? senna-docusate  2 tablet Oral BID  ? sodium chloride flush  3 mL Intravenous Q12H  ? sucralfate  1 g Oral Q6H  ? ?Continuous Infusions: ? sodium chloride    ? sodium chloride    ? sodium chloride    ? sodium chloride    ? sodium chloride    ? ?PRN Meds: ?sodium chloride, sodium chloride, sodium chloride, sodium chloride, acetaminophen **OR** acetaminophen, alteplase, budesonide, diphenhydrAMINE, heparin, HYDROcodone-acetaminophen, lidocaine (PF), lidocaine-prilocaine, nitroGLYCERIN, ondansetron **OR** ondansetron (ZOFRAN) IV, pentafluoroprop-tetrafluoroeth ? ?Allergies:    ?Allergies  ?Allergen Reactions  ? Hydrochlorothiazide Nausea And Vomiting  ? Isosorbide Dinitrate Other (See Comments)  ?  Lowered the B/P too far  ? ? ?Social History:   ?Social History  ? ?Socioeconomic History  ? Marital status: Married  ?  Spouse name: Not on file  ? Number of children: Not on file  ? Years of  education: Not on file  ? Highest education level: Not on file  ?Occupational History  ? Not on file  ?Tobacco Use  ? Smoking status: Former  ?  Packs/day: 1.00  ?  Years: 15.00  ?  Pack years: 15.00  ?  Types: Cigarette

## 2021-05-18 DIAGNOSIS — R55 Syncope and collapse: Secondary | ICD-10-CM | POA: Diagnosis not present

## 2021-05-18 DIAGNOSIS — K922 Gastrointestinal hemorrhage, unspecified: Secondary | ICD-10-CM | POA: Diagnosis not present

## 2021-05-18 DIAGNOSIS — R0689 Other abnormalities of breathing: Secondary | ICD-10-CM | POA: Diagnosis not present

## 2021-05-18 DIAGNOSIS — N186 End stage renal disease: Secondary | ICD-10-CM | POA: Diagnosis not present

## 2021-05-18 LAB — BASIC METABOLIC PANEL
Anion gap: 12 (ref 5–15)
BUN: 30 mg/dL — ABNORMAL HIGH (ref 8–23)
CO2: 28 mmol/L (ref 22–32)
Calcium: 8.2 mg/dL — ABNORMAL LOW (ref 8.9–10.3)
Chloride: 95 mmol/L — ABNORMAL LOW (ref 98–111)
Creatinine, Ser: 5.61 mg/dL — ABNORMAL HIGH (ref 0.61–1.24)
GFR, Estimated: 9 mL/min — ABNORMAL LOW (ref >60)
Glucose, Bld: 135 mg/dL — ABNORMAL HIGH (ref 70–99)
Potassium: 3.8 mmol/L (ref 3.5–5.1)
Sodium: 135 mmol/L (ref 135–145)

## 2021-05-18 LAB — CBC
HCT: 23 % — ABNORMAL LOW (ref 39.0–52.0)
HCT: 23.3 % — ABNORMAL LOW (ref 39.0–52.0)
Hemoglobin: 7.6 g/dL — ABNORMAL LOW (ref 13.0–17.0)
Hemoglobin: 7.8 g/dL — ABNORMAL LOW (ref 13.0–17.0)
MCH: 30.5 pg (ref 26.0–34.0)
MCH: 31.2 pg (ref 26.0–34.0)
MCHC: 33 g/dL (ref 30.0–36.0)
MCHC: 33.5 g/dL (ref 30.0–36.0)
MCV: 92.4 fL (ref 80.0–100.0)
MCV: 93.2 fL (ref 80.0–100.0)
Platelets: 259 10*3/uL (ref 150–400)
Platelets: 285 10*3/uL (ref 150–400)
RBC: 2.49 MIL/uL — ABNORMAL LOW (ref 4.22–5.81)
RBC: 2.5 MIL/uL — ABNORMAL LOW (ref 4.22–5.81)
RDW: 15.2 % (ref 11.5–15.5)
RDW: 15.1 % (ref 11.5–15.5)
WBC: 7.5 10*3/uL (ref 4.0–10.5)
WBC: 7.7 10*3/uL (ref 4.0–10.5)
nRBC: 0 % (ref 0.0–0.2)
nRBC: 0 % (ref 0.0–0.2)

## 2021-05-18 LAB — GLUCOSE, CAPILLARY
Glucose-Capillary: 149 mg/dL — ABNORMAL HIGH (ref 70–99)
Glucose-Capillary: 133 mg/dL — ABNORMAL HIGH (ref 70–99)
Glucose-Capillary: 108 mg/dL — ABNORMAL HIGH (ref 70–99)
Glucose-Capillary: 241 mg/dL — ABNORMAL HIGH (ref 70–99)
Glucose-Capillary: 208 mg/dL — ABNORMAL HIGH (ref 70–99)

## 2021-05-18 LAB — HEPATITIS B SURFACE ANTIBODY, QUANTITATIVE: Hep B S AB Quant (Post): 98.5 m[IU]/mL (ref >9.9)

## 2021-05-18 MED ORDER — CLOPIDOGREL BISULFATE 75 MG PO TABS
75.0000 mg | ORAL_TABLET | Freq: Every day | ORAL | Status: DC
Start: 2021-05-18 — End: 2021-05-19
  Administered 2021-05-18 – 2021-05-19 (×2): 75 mg via ORAL
  Filled 2021-05-18 (×2): qty 1

## 2021-05-18 MED ORDER — INSULIN ASPART 100 UNIT/ML IJ SOLN
0.0000 [IU] | Freq: Three times a day (TID) | INTRAMUSCULAR | Status: DC
  Administered 2021-05-18: 2 [IU] via SUBCUTANEOUS

## 2021-05-18 NOTE — Progress Notes (Signed)
? ? ? [facility] ?                                                                            ? ? ?Nicholas [name], is a 88 y.o. male, DOB - 05/21/1930, NID:[id number] ?Admit date - 05/15/2021    ?Outpatient Primary MD for the patient is Windy Fast, MD ? ?LOS - 3  days ? ? ? ?Brief summary  ? ?Nicholas [name] is a pleasant 88 y.o. male with medical history significant for ESRD on hemodialysis, type 2 diabetes mellitus, coronary artery disease, ischemic left MCA stroke 1 month ago, and recent admission for GI bleeding who was discharged from the hospital yesterday and now returns after syncopal episode.  Patient was seen by GI during the recent admission, bleeding attributed to his pandiverticulosis with recent initiation of DAPT, he was transfused 1 unit, remained stable, and went home  .  He had 2 bloody bowel movements shortly after returning home, no abdominal pain or vomiting, felt well , but became flushed and lightheaded while walking to the car to go to dialysis, and then had a witnessed loss of consciousness lasting several minutes and followed by fatigue. ?He was admitted for evaluation of GI bleed. NM RBC scan is negative for active bleed. Plavix was restarted , monitor overnight , recheck hemoglobin and if stable can be discharged in am.  ? ? ?Assessment & Plan  ? ? ?Assessment and Plan: ? ?Syncope probably secondary to vasovagal episode ?-EKG is normal sinus rhythm ?-No arrhythmias  on telemetry ?-Orthostatic vital signs are negative at this time. ?-His last echocardiogram last month reviewed, no indication to repeat it at this time. ? ? ? ?GI bleeding probably secondary to pandiverticulosis.  Patient  was  on Plavix after CVA last month.  Patient reports having 2 bloody bowel movements at home after being discharged.  Hemoglobin was 7 underwent 1 unit of PRBC transfusion and improved to 7.7 and stabilized.  ? NM RBC scan is negative. Restarted plavix, and monitor overnight and possible d.c in am  if no bleeding. GI consulted, recommends starting aspirin after a few weeks if no recurrent bleeding.  ? ? ? ?End-stage renal disease on dialysis on Tuesdays Thursdays and Saturdays. ?Nephrology on board.  ? ? ? ?Type 2 diabetes mellitus insulin-dependent with hypoglycemia ?Uncontrolled with hypoglycemia and hyperglycemia.  ?Last A1c 7.6% in March 2023.CBG (last 3)  ?CBG (last 3)  ?Recent Labs  ?  05/18/21 ?0748 05/18/21 ?1238 05/18/21 ?1655  ?GLUCAP 133* 108* 241*  ? ? ?Continue with SSI.  ? ? ? ? ?Hx of recent left MCA infarct ?Resume plavix and restart aspirin after a few weeks if no recurrent bleeding.  ? ? ?CAD:  ?Continue with statin.  ? ? ?Chest pain intermittent since admission requesting for NTG,  pain At rest non radiating, sub sternal burning pain, spontaneously resolved, and also with NTG.  ?Ordered sucralfate and Protonix with some improvement.  ?EKG shows NSR, no ischemic changes, troponin elevated.  ?Elevated troponins from ?demand ischemia from blood loss and hypotension and ESRD.  ?Last echocardiogram reviewed.  ?Cardiology, and no further work up needed at this time.  ? ? ? ?Estimated body mass  index is 23.03 kg/m? as calculated from the following: ?  Height as of this encounter: '5\' 8"'$  (1.727 m). ?  Weight as of this encounter: 68.7 kg. ? ?Code Status: Full Code. ?DVT Prophylaxis:  SCDs Start: 05/15/21 1955 ? ? ?Level of Care: Level of care: Telemetry Medical ?Family Communication: discussed with son over the phone.  ? ?Disposition Plan:     Remains inpatient appropriate:  gi bleed ? ?Procedures:  ?NM RBC scan.  ? ?Consultants:   ?Gastroenterology ?Nephrology ?Cardiology ?Palliative care.  ? ?Antimicrobials:  ? ?Anti-infectives (From admission, onward)  ? ? None  ? ?  ? ? ? ?Medications ? ?Scheduled Meds: ? atorvastatin  80 mg Oral QHS  ? cinacalcet  30 mg Oral Q T,Th,Sa-HD  ? clopidogrel  75 mg Oral Daily  ? feeding supplement  237 mL Oral BID BM  ? insulin aspart  0-6 Units Subcutaneous TID WC  ?  insulin aspart protamine- aspart  6 Units Subcutaneous BID WC  ? midodrine  5 mg Oral TID WC  ? pantoprazole  40 mg Oral Q0600  ? psyllium  1 packet Oral BID  ? senna-docusate  2 tablet Oral BID  ? sodium chloride flush  3 mL Intravenous Q12H  ? sucralfate  1 g Oral Q6H  ? ?Continuous Infusions: ? sodium chloride    ? ?PRN Meds:.acetaminophen **OR** acetaminophen, budesonide, diphenhydrAMINE, HYDROcodone-acetaminophen, nitroGLYCERIN, ondansetron **OR** ondansetron (ZOFRAN) IV ? ? ? ?Subjective:  ? ?Nicholas [name] was seen and examined today.  Discussed the plan. No new complaints.  ? ?Objective:  ? ?Vitals:  ? 05/18/21 0952 05/18/21 1000 05/18/21 1030 05/18/21 1058  ?BP: (!) 101/52 104/63 (!) 111/53 (!) 119/57  ?Pulse: 79 82 82 81  ?Resp:    15  ?Temp:    98 ?F (36.7 ?C)  ?TempSrc:    Oral  ?SpO2:    100%  ?Weight:    68.7 kg  ?Height:      ? ? ?Intake/Output Summary (Last 24 hours) at 05/18/2021 1706 ?Last data filed at 05/18/2021 1058 ?Gross per 24 hour  ?Intake 240 ml  ?Output 887 ml  ?Net -647 ml  ? ? ?Filed Weights  ? 05/17/21 2000 05/18/21 0811 05/18/21 1058  ?Weight: 70.2 kg 68.9 kg 68.7 kg  ? ? ? ?Exam ?General exam: Appears calm and comfortable  ?Respiratory system: Clear to auscultation. Respiratory effort normal. ?Cardiovascular system: S1 & S2 heard, RRR. No JVD, murmurs, rubs, gallops or clicks. No pedal edema. ?Gastrointestinal system: Abdomen is nondistended, soft and nontender. No organomegaly or masses felt. Normal bowel sounds heard. ?Central nervous system: Alert and oriented. No focal neurological deficits. ?Extremities: Symmetric 5 x 5 power. ?Skin: No rashes, lesions or ulcers ?Psychiatry: Judgement and insight appear normal. Mood & affect appropriate.  ? ? ? ? ?Data Reviewed:  I have personally reviewed following labs and imaging studies ? ? ?CBC ?Lab Results  ?Component Value Date  ? WBC 7.7 05/18/2021  ? RBC 2.50 (L) 05/18/2021  ? HGB 7.8 (L) 05/18/2021  ? HCT 23.3 (L) 05/18/2021  ? MCV 93.2  05/18/2021  ? MCH 31.2 05/18/2021  ? PLT 285 05/18/2021  ? MCHC 33.5 05/18/2021  ? RDW 15.1 05/18/2021  ? LYMPHSABS 0.8 05/15/2021  ? MONOABS 0.8 05/15/2021  ? EOSABS 0.1 05/15/2021  ? BASOSABS 0.0 05/15/2021  ? ? ? ?Last metabolic panel ?Lab Results  ?Component Value Date  ? NA 135 05/18/2021  ? K 3.8 05/18/2021  ? CL 95 (L) 05/18/2021  ?  CO2 28 05/18/2021  ? BUN 30 (H) 05/18/2021  ? CREATININE 5.61 (H) 05/18/2021  ? GLUCOSE 135 (H) 05/18/2021  ? GFRNONAA 9 (L) 05/18/2021  ? GFRAA 8 (L) 03/20/2019  ? CALCIUM 8.2 (L) 05/18/2021  ? PHOS 4.4 05/17/2021  ? PROT 6.1 (L) 05/15/2021  ? ALBUMIN 2.6 (L) 05/17/2021  ? BILITOT 0.5 05/15/2021  ? ALKPHOS 83 05/15/2021  ? AST 12 (L) 05/15/2021  ? ALT 12 05/15/2021  ? ANIONGAP 12 05/18/2021  ? ? ?CBG (last 3)  ?Recent Labs  ?  05/18/21 ?0748 05/18/21 ?1238 05/18/21 ?1655  ?GLUCAP 133* 108* 241*  ? ?  ? ? ?Coagulation Profile: ?No results for input(s): INR, PROTIME in the last 168 hours. ? ? ?Radiology Studies: ?NM GI Blood Loss ? ?Result Date: 05/17/2021 ?CLINICAL DATA:  History of diverticulitis and in stage renal disease on dialysis. Recent admission for GI bleed. EXAM: NUCLEAR MEDICINE GASTROINTESTINAL BLEEDING SCAN TECHNIQUE: Sequential abdominal images were obtained following intravenous administration of Tc-67mlabeled red blood cells. RADIOPHARMACEUTICALS:  26.2 mCi Tc-937mertechnetate in-vitro labeled red cells. COMPARISON:  CT AP 06/06/2020 FINDINGS: Dynamic imaging was performed for 120 minutes. No abnormal foci of increased radiotracer uptake is identified to suggest active GI bleed. IMPRESSION: No signs to suggest active GI bleed. Electronically Signed   By: TaKerby Moors.D.   On: 05/17/2021 15:25   ? ? ? ? ?ViHosie Poisson.D. ?[facility] ?05/18/2021, 5:06 PM ? ?Available via Epic secure chat 7am-7pm ?After 7 pm, please refer to night coverage provider listed on amion. ? ? ? ?

## 2021-05-18 NOTE — Care Management Important Message (Signed)
Important Message ? ?Patient Details  ?Name: Nicholas [name] ?MRN: [id number] ?Date of Birth: Jan 01, 1931 ? ? ?Medicare Important Message Given:  Yes ? ? ? ? ?[name] ?05/18/2021, 3:55 PM ?

## 2021-05-18 NOTE — Procedures (Signed)
I was present at this dialysis session. I have reviewed the session itself and made appropriate changes.  ? ?Seen on HD.  UF goal 1L.  Had a good night. No further obvious GIB.  Hb stable today.  Cardiology notes reviewed. ? ?Using AVF.   ? ?Filed Weights  ? 05/17/21 1620 05/17/21 2000 05/18/21 0811  ?Weight: 70.8 kg 70.2 kg 68.9 kg  ? ? ?Recent Labs  ?Lab 05/17/21 ?1212 05/18/21 ?0622  ?NA 138 135  ?K 4.3 3.8  ?CL 99 95*  ?CO2 24 28  ?GLUCOSE 84 135*  ?BUN 67* 30*  ?CREATININE 7.96* 5.61*  ?CALCIUM 7.9* 8.2*  ?PHOS 4.4  --   ? ? ?Recent Labs  ?Lab 05/12/21 ?2102 05/13/21 ?0200 05/15/21 ?1634 05/16/21 ?0426 05/17/21 ?1212 05/18/21 ?0622 05/18/21 ?0849  ?WBC 8.9   < > 8.3   < > 7.2 7.5 7.7  ?NEUTROABS 6.5  --  6.6  --   --   --   --   ?HGB 6.6*   < > 7.0*   < > 7.7* 7.6* 7.8*  ?HCT 20.6*   < > 21.2*   < > 23.7* 23.0* 23.3*  ?MCV 98.1   < > 96.8   < > 92.9 92.4 93.2  ?PLT 307   < > 265   < > 301 259 285  ? < > = values in this interval not displayed.  ? ? ?Scheduled Meds: ? atorvastatin  80 mg Oral QHS  ? cinacalcet  30 mg Oral Q T,Th,Sa-HD  ? clopidogrel  75 mg Oral Daily  ? feeding supplement  237 mL Oral BID BM  ? insulin aspart  0-6 Units Subcutaneous Q4H  ? insulin aspart protamine- aspart  6 Units Subcutaneous BID WC  ? midodrine  5 mg Oral TID WC  ? pantoprazole  40 mg Oral Q0600  ? psyllium  1 packet Oral BID  ? senna-docusate  2 tablet Oral BID  ? sodium chloride flush  3 mL Intravenous Q12H  ? sucralfate  1 g Oral Q6H  ? ?Continuous Infusions: ? sodium chloride    ? ?PRN Meds:.acetaminophen **OR** acetaminophen, budesonide, diphenhydrAMINE, HYDROcodone-acetaminophen, nitroGLYCERIN, ondansetron **OR** ondansetron (ZOFRAN) IV   ?[name]  MD ?05/18/2021, 9:39 AM ?  ?

## 2021-05-18 NOTE — Progress Notes (Signed)
[facility] Progress Note ? ?Yong Grieser 88 y.o. 24-Jul-1930 ? ?CC: GI bleed ? ? ?HPI : ?This is a 88 year old patient readmitted to the hospital with GI bleeding.  Patient known to our service from recent admission.  Past medical history of end-stage renal disease on dialysis, history of stroke and was started on aspirin and Plavix around a month ago. ? ?Patient with history of iron deficiency anemia in the past.  Looks like he had EGD with push enteroscopy and APC of gastric AVM as well as colonoscopy and APC of cecal AVM in 2021 in [facility].  Also had capsule endoscopy at [facility] but report not available to review.  ? ?He also had EGD and colonoscopy in June 2022.  EGD was normal.  Colonoscopy showed diverticulosis. ? ?Presented again with GI bleed which has resolved now.  No bowel movement today.  Bleeding scan negative yesterday. ? ?ROS : Negative for active chest pain.  Negative for nausea and vomiting.  Negative for abdominal pain ? ?Objective: ?Vital signs in last 24 hours: ?Vitals:  ? 05/18/21 1030 05/18/21 1058  ?BP: (!) 111/53 (!) 119/57  ?Pulse: 82 81  ?Resp:  15  ?Temp:  98 ?F (36.7 ?C)  ?SpO2:  100%  ? ? ?Physical Exam: ? ?General : Elderly patient.  Not in acute distress. ?HEENT -normal cephalic atraumatic, extraocular movement intact ?Abdomen : Soft, nontender, nondistended, bowel sounds present.  No peritoneal signs ?Neuro -alert and oriented x3 ?Psych -mood and affect normal ? ?Lab Results: ?Recent Labs  ?  05/17/21 ?1022 05/17/21 ?1212 05/18/21 ?0622  ?NA 139 138 135  ?K 5.0 4.3 3.8  ?CL 99 99 95*  ?CO2 '22 24 28  '$ ?GLUCOSE 255* 84 135*  ?BUN 86* 67* 30*  ?CREATININE 10.50* 7.96* 5.61*  ?CALCIUM 7.7* 7.9* 8.2*  ?PHOS 6.0* 4.4  --   ? ?Recent Labs  ?  05/15/21 ?1634 05/17/21 ?1022 05/17/21 ?1212  ?AST 12*  --   --   ?ALT 12  --   --   ?ALKPHOS 83  --   --   ?BILITOT 0.5  --   --   ?PROT 6.1*  --   --   ?ALBUMIN 2.7* 2.6* 2.6*  ? ?Recent Labs  ?  05/15/21 ?1634 05/16/21 ?0426  05/18/21 ?0622 05/18/21 ?0849  ?WBC 8.3   < > 7.5 7.7  ?NEUTROABS 6.6  --   --   --   ?HGB 7.0*   < > 7.6* 7.8*  ?HCT 21.2*   < > 23.0* 23.3*  ?MCV 96.8   < > 92.4 93.2  ?PLT 265   < > 259 285  ? < > = values in this interval not displayed.  ? ?No results for input(s): LABPROT, INR in the last 72 hours. ? ? ? ?Assessment/Plan: ?-Recurrent hematochezia.  Hemoglobin stable.  GI bleeding scan negative.  Looks like he had EGD with push enteroscopy, colonoscopy and capsule endoscopy in 2021.  Also had EGD and colonoscopy in June 2022. ?-History of stroke.  Was on aspirin and Plavix  ? ?Recommendations ?------------------------- ?- ok to resume Plavix from GI standpoint.  Consider adding aspirin after few weeks if no recurrent bleeding. ?-Advance diet as tolerated ?-No further inpatient GI work-up planned.  GI will sign off.  Call us back if needed. ? ? ? ?Otis Brace MD, FACP ?05/18/2021, 1:41 PM ? ?Contact #  [phone]  ?

## 2021-05-18 NOTE — Progress Notes (Signed)
Pt receives out-pt HD at [facility] HD unit on TTS. Will assist as needed.  ? ?Melven Sartorius ?Renal Navigator ?[phone] ?

## 2021-05-18 NOTE — TOC Initial Note (Addendum)
Transition of Care (TOC) - Initial/Assessment Note  ? ? ?Patient Details  ?Name: Nicholas [name] ?MRN: [id number] ?Date of Birth: [birth year] ? ?Transition of Care [facility]) CM/SW Contact:    ?Sharin Mons, RN ?Phone Number: ?05/18/2021, 8:28 AM ? ?Clinical Narrative:   ? Readmitted with syncopal episode. Previous admitted  4/19-4/21/23- with lower GIB.            ?NCM spoke with pt/pt's son regarding d/c planning. Son states pt resides with wife and himself. PTA required min. assist with ADL's. Active with [facility], PT/OT. Pt agreeable to resumption of services with [facility] once d/c, NCM made liaison aware. Resumption orders for home healrh services will be needed from MD @ d/c. ?Pt with DME @ home RW, W/C. ?Pt without transportation issues or problems affording Rx medication. ? ?TOC team following and will assist as needs presents.... ? ?Expected Discharge Plan: [place] ?Barriers to Discharge: Continued Medical Work up ? ? ?Patient Goals and CMS Choice ?  ?  ?Choice offered to / list presented to : Patient ? ?Expected Discharge Plan and Services ?Expected Discharge Plan: [place] ?  ?Discharge Planning Services: CM Consult ?  ?Living arrangements for the past 2 months: [place] ?                ?  ?  ?  ?  ?  ?HH Arranged: PT, OT ?[place] Agency: [place] ?  ?  ?Representative spoke with at [place]: Tommi Rumps ? ?Prior Living Arrangements/Services ?Living arrangements for the past 2 months: [place] ?Lives with:: Adult Children, Spouse ?Patient language and need for interpreter reviewed:: Yes ?Do you feel safe going back to the place where you live?: Yes      ?Need for Family Participation in Patient Care: Yes (Comment) ?Care giver support system in place?: Yes (comment) ?Current home services: DME (RW,W/C) ?Criminal Activity/Legal Involvement Pertinent to Current Situation/Hospitalization: No - Comment as needed ? ?Activities of Daily  Living ?Home Assistive Devices/Equipment: Gilford Rile (specify type) ?ADL Screening (condition at time of admission) ?Patient's cognitive ability adequate to safely complete daily activities?: Yes ?Is the patient deaf or have difficulty hearing?: No ?Does the patient have difficulty seeing, even when wearing glasses/contacts?: No ?Does the patient have difficulty concentrating, remembering, or making decisions?: No ?Patient able to express need for assistance with ADLs?: No ?Does the patient have difficulty dressing or bathing?: Yes ?Independently performs ADLs?: Yes (appropriate for developmental age) ?Does the patient have difficulty walking or climbing stairs?: Yes ?Weakness of Legs: Both ?Weakness of Arms/Hands: Both ? ?Permission Sought/Granted ?  ?Permission granted to share information with : Yes, Verbal Permission Granted ? Share Information with NAME: Deontra Pereyra (Son) [phone] ?   ?   ?   ? ?Emotional Assessment ?  ?  ?  ?Orientation: : Oriented to Self, Oriented to Place, Oriented to  Time, Oriented to Situation ?Alcohol / Substance Use: Not Applicable ?Psych Involvement: No (comment) ? ?Admission diagnosis:  Vasovagal syncope [R55] ?Acute GI bleeding [K92.2] ?ESRD on dialysis ([place]) [N18.6, Z99.2] ?Gastrointestinal hemorrhage, unspecified gastrointestinal hemorrhage type [K92.2] ?Patient Active Problem List  ? Diagnosis Date Noted  ? Stroke [facility]) 04/15/2021  ? Chronic respiratory insufficiency 04/15/2021  ? History of loop recorder 01/28/2021  ? Syncope, vasovagal 09/09/2020  ? Acute GI bleeding 09/09/2020  ? Bradycardia   ? Complete heart block ([place])   ? Acute blood loss anemia 07/04/2020  ?  Left renal mass 06/06/2020  ? Chest pain 06/05/2020  ? Hypotension   ? Syncope and collapse 03/17/2019  ? Acute anemia 09/28/2018  ? GIB (gastrointestinal bleeding) 09/27/2018  ? Anemia due to blood loss 09/27/2018  ? Type II diabetes mellitus with renal manifestations ([place]) 09/27/2018  ? GERD (gastroesophageal  reflux disease) 09/27/2018  ? CAD (coronary artery disease) 09/27/2018  ? NSTEMI (non-ST elevated myocardial infarction) ([place]) 08/06/2018  ? Hypertension   ? Hypercholesteremia   ? ESRD (end stage renal disease) on dialysis [facility])   ? Anemia of chronic disease   ? Lung nodule seen on imaging study 04/30/2012  ? ?PCP:  Windy Fast, MD ?Pharmacy:   ?[facility], [address] ?[phone] ?[place] [address] ?Phone: [phone] Fax: [phone] ? ?[facility] [address] ?[place] [address] ?Phone: [phone] Fax: [phone] ? ? ? ? ?Social Determinants of Health (SDOH) Interventions ?  ? ?Readmission Risk Interventions ?   ? View : No data to display.  ?  ?  ?  ? ? ? ?

## 2021-05-18 NOTE — Plan of Care (Signed)

## 2021-05-19 DIAGNOSIS — R55 Syncope and collapse: Secondary | ICD-10-CM | POA: Diagnosis not present

## 2021-05-19 LAB — CBC WITH DIFFERENTIAL/PLATELET
Abs Immature Granulocytes: 0.03 10*3/uL (ref 0.00–0.07)
Basophils Absolute: 0 10*3/uL (ref 0.0–0.1)
Basophils Relative: 0 %
Eosinophils Absolute: 0.2 10*3/uL (ref 0.0–0.5)
Eosinophils Relative: 3 %
HCT: 21.4 % — ABNORMAL LOW (ref 39.0–52.0)
Hemoglobin: 7.1 g/dL — ABNORMAL LOW (ref 13.0–17.0)
Immature Granulocytes: 0 %
Lymphocytes Relative: 12 %
Lymphs Abs: 0.9 10*3/uL (ref 0.7–4.0)
MCH: 31 pg (ref 26.0–34.0)
MCHC: 33.2 g/dL (ref 30.0–36.0)
MCV: 93.4 fL (ref 80.0–100.0)
Monocytes Absolute: 1 10*3/uL (ref 0.1–1.0)
Monocytes Relative: 13 %
Neutro Abs: 5.3 10*3/uL (ref 1.7–7.7)
Neutrophils Relative %: 72 %
Platelets: 285 10*3/uL (ref 150–400)
RBC: 2.29 MIL/uL — ABNORMAL LOW (ref 4.22–5.81)
RDW: 15.1 % (ref 11.5–15.5)
WBC: 7.3 10*3/uL (ref 4.0–10.5)
nRBC: 0 % (ref 0.0–0.2)

## 2021-05-19 LAB — GLUCOSE, CAPILLARY: Glucose-Capillary: 103 mg/dL — ABNORMAL HIGH (ref 70–99)

## 2021-05-19 NOTE — Progress Notes (Signed)
D/C order noted. [facility] HD unit and spoke to [employer]. Clinic advised of pt's d/c today and that pt will resume care tomorrow. D/C summary and last renal note faxed to [facility] for continuation of care.  ? ?Melven Sartorius ?Renal Navigator ?[phone] ?

## 2021-05-19 NOTE — Discharge Instructions (Signed)
Hold aspirin on discharge until follow-up with Primary care physician ?

## 2021-05-19 NOTE — Progress Notes (Signed)
Pt is being discharged from the hospital. All questions are answered.  ?

## 2021-05-19 NOTE — Progress Notes (Signed)
Physical Therapy Treatment ?Patient Details ?Name: Nicholas [name] ?MRN: [id number] ?DOB: 02-24-1930 ?Today's Date: 05/19/2021 ? ? ?History of Present Illness Pt is an 88 y.o. male recently admitted 4/19-4/21/23 with lower GIB, now readmitted 05/15/21 with syncopal episode (LOC lasting several minutes), bloody BM. Per workup, suspect syncope related to vasovagal. Pt had GI bleeding scan 4/24, no signs to suggest active GIB. PMH includes ischemic L MCA stroke (03/2021), ESRD (HD TTS), DM2, CAD, arthritis. ?  ?PT Comments  ? ? Pt progressing with mobility, preparing for d/c home today. Pt performing seated/standing ADL tasks at supervision-level, requiring assist for LB dressing. Pt demonstrates good awareness of energy conservation strategies; reviewed educ regarding this and activity recommendations. Pt reports no further questions or concerns. If to remain admitted, will continue to follow acutely. ?   ?Recommendations for follow up therapy are one component of a multi-disciplinary discharge planning process, led by the attending physician.  Recommendations may be updated based on patient status, additional functional criteria and insurance authorization. ? ?Follow Up Recommendations ? Home health PT ?  ?  ?Assistance Recommended at Discharge Intermittent Supervision/Assistance  ?Patient can return home with the following A little help with walking and/or transfers;Help with stairs or ramp for entrance;Assist for transportation;A little help with bathing/dressing/bathroom ?  ?Equipment Recommendations ? None recommended by PT  ?  ?Recommendations for Other Services   ? ? ?  ?Precautions / Restrictions Precautions ?Precautions: Fall;Other (comment) ?Precaution Comments: h/o syncopal episode leading to admission ?Restrictions ?Weight Bearing Restrictions: No  ?  ? ?Mobility ? Bed Mobility ?  ?  ?  ?  ?  ?  ?  ?General bed mobility comments: received sitting in recliner ?  ? ?Transfers ?Overall transfer level: Needs  assistance ?Equipment used: None ?Transfers: Sit to/from Stand ?Sit to Stand: Supervision ?  ?  ?  ?  ?  ?General transfer comment: multiple sit<>stands from recliner without DME to don clothes ?  ? ?Ambulation/Gait ?  ?  ?  ?  ?  ?  ?  ?  ? ? ?Stairs ?  ?  ?  ?  ?  ? ? ?Wheelchair Mobility ?  ? ?Modified Rankin (Stroke Patients Only) ?  ? ? ?  ?Balance Overall balance assessment: Needs assistance ?Sitting-balance support: No upper extremity supported, Feet supported ?Sitting balance-Leahy Scale: Good ?Sitting balance - Comments: requires assist to don shoes/socks ?  ?Standing balance support: No upper extremity supported ?Standing balance-Leahy Scale: Fair ?Standing balance comment: able to stand to don and adjust pants without UE support ?  ?  ?  ?  ?  ?  ?  ?  ?  ?  ?  ?  ? ?  ?Cognition Arousal/Alertness: Awake/alert ?Behavior During Therapy: [facility] for tasks assessed/performed ?Overall Cognitive Status: Within Functional Limits for tasks assessed ?  ?  ?  ?  ?  ?  ?  ?  ?  ?  ?  ?  ?  ?  ?  ?  ?  ?  ?  ? ?  ?Exercises   ? ?  ?General Comments General comments (skin integrity, edema, etc.): pt donning/doffing O2 [place] appropriately with ADL tasks (getting dressed), requiring 1x cue to put O2 back on once finished dressing; pt reports, "I'm not sure I even need this" - unable to confirm due to difficulty getting reliable pulse ox reading via finger probe. pt preparing for d/c home, reports having necessary DME and assist from family for ADL tasks.  demonstrates good awareness of energy conserv strategies ?  ?  ? ?Pertinent Vitals/Pain    ? ? ?Home Living   ?  ?  ?  ?  ?  ?  ?  ?  ?  ?   ?  ?Prior Function    ?  ?  ?   ? ?PT Goals (current goals can now be found in the care plan section) Progress towards PT goals: Progressing toward goals ? ?  ?Frequency ? ? ? Min 3X/week ? ? ? ?  ?PT Plan Current plan remains appropriate  ? ? ?Co-evaluation   ?  ?  ?  ?  ? ?  ?AM-PAC PT "6 Clicks" Mobility   ?Outcome Measure ? Help  needed turning from your back to your side while in a flat bed without using bedrails?: None ?Help needed moving from lying on your back to sitting on the side of a flat bed without using bedrails?: None ?Help needed moving to and from a bed to a chair (including a wheelchair)?: A Little ?Help needed standing up from a chair using your arms (e.g., wheelchair or bedside chair)?: A Little ?Help needed to walk in hospital room?: A Little ?Help needed climbing 3-5 steps with a railing? : A Little ?6 Click Score: 20 ? ?  ?End of Session Equipment Utilized During Treatment: Oxygen ?Activity Tolerance: Patient tolerated treatment well ?Patient left: in chair;with call bell/phone within reach;with chair alarm set ?Nurse Communication: Mobility status ?PT Visit Diagnosis: Other abnormalities of gait and mobility (R26.89) ?  ? ? ?Time: 4540-9811 ?PT Time Calculation (min) (ACUTE ONLY): 15 min ? ?Charges:  $Therapeutic Activity: 8-22 mins          ?          ? ?Nicholas [name], PT, DPT ?Acute Rehabilitation Services  ?Pager [phone] ?Office [phone] ? ?Nicholas [name] ?05/19/2021, 1:19 PM ? ?

## 2021-05-19 NOTE — Discharge Summary (Signed)
?Physician Discharge Summary  ?Nicholas [name] BJY:[id number] DOB: Jan 19, 1931 DOA: 05/15/2021 ? ?PCP: Windy Fast, MD ? ?Admit date: 05/15/2021 ?Discharge date: 05/19/2021 ? ?Admitted From: Home ?Disposition: Home ? ?Recommendations for Outpatient Follow-up:  ?Follow up with PCP in 1-2 weeks ?Follow-up with [facility] gastroenterology in 2 weeks ?Continue to hold aspirin on discharge, follow-up with PCP for further guidance on restarting; GI recommended holding for a few weeks. ?Please obtain CBC at next HD session ? ?Home Health: PT/OT ?Equipment/Devices: None ? ?Discharge Condition: Stable ?CODE STATUS: Full code ?Diet recommendation: Renal diet ? ?History of present illness: ? ?Nicholas [name] is a 88 year old male with past medical history significant for ESRD on HD TTS, type 2 diabetes mellitus, CAD, recent left MCA ischemic stroke 1 month ago who presents to [facility] ED on 4/22 with recurrent bloody stools.  Patient recently discharged, bleeding attributed to his pandiverticulosis with recent initiation of dual antiplatelet therapy; patient was transfused 1 unit PRBC during that admission and he remained stable and subsequently discharged home.  Patient on the following day had recurrent 2 bloody bowel movements with no abdominal pain or vomiting.  Only concern with some lightheadedness with subsequent presyncopal episode while walking to his car to go to dialysis.  She was admitted for evaluation of recurrent GI bleed. ? ?Hospital course: ? ?Syncope likely secondary to vasovagal episode ?Patient presenting after presyncopal versus syncopal episode after walking to his car to go to dialysis.  EKG with normal sinus rhythm, no arrhythmias noted on telemetry.  Recent TTE 04/16/2021 with LVEF 60 to 65%, no LV regional wall motion abnormalities, grade 1 diastolic dysfunction, trivial MR, LA mildly dilated, no aortic stenosis, IVC normal in size.  Etiology likely secondary to blood loss anemia from recurrent diverticular bleed.   Patient was monitored closely during his hospitalization and transfuse 1 unit PRBC with resolution of symptoms.  Patient work with physical therapy/Occupational Therapy with recommendations of home health on discharge.  Recommend repeat CBC at next HD appointment, recommend transfusion if hemoglobin less than 7.0. ? ?Lower GI bleed likely secondary to pandiverticulosis ?Complicated by recent initiation of dual antiplatelet therapy with Plavix/aspirin following ischemic CVA last month.  Patient reports 2 bloody bowel movements at home after being recently discharged.  Patient was transfused 1 unit PRBC at time of admission for hemoglobin 7.0.  Nuclear medicine RBC scan negative.  Patient was seen by gastroenterology during hospitalization who followed throughout.  GI recommended restarting Plavix and continue to hold aspirin on discharge for a few weeks.  No further recurrent bloody bowel movements while inpatient.  Patient's symptoms have resolved and ready for discharge home.  Recommend repeat CBC at next HD session and transfuse if hemoglobin less than 7.0. ? ?ESRD on HD ?Nephrology was consulted and followed during hospital course.  Continue hemodialysis per Tuesday/Thursday/Saturday schedule. ? ?Type 2 diabetes mellitus ?Hemoglobin A1c 7.29 March 2021.  Continue home insulin regimen. ? ?History of recent left MCA infarct ?Restarted Plavix.  Continue to hold aspirin for a few weeks per GI recommendations.  Outpatient follow-up with PCP/neurology. ? ?CAD ?Continue statin ? ? ?Discharge Diagnoses:  ?Principal Problem: ?  Syncope, vasovagal ?Active Problems: ?  ESRD (end stage renal disease) on dialysis [facility]) ?  Type II diabetes mellitus with renal manifestations ([place]) ?  CAD (coronary artery disease) ?  Acute GI bleeding ?  Stroke [facility]) ?  Chronic respiratory insufficiency ? ? ? ?Discharge Instructions ? ?Discharge Instructions   ? ? Diet - low sodium heart healthy  Complete by: As directed ?  ? Increase activity  slowly   Complete by: As directed ?  ? ?  ? ?Allergies as of 05/19/2021   ? ?   Reactions  ? Hydrochlorothiazide Nausea And Vomiting  ? Isosorbide Dinitrate Other (See Comments)  ? Lowered the B/P too far  ? ?  ? ?  ?Medication List  ?  ? ?STOP taking these medications   ? ?[place]on[employer] Digihaler 232 MCG/ACT Aepb ?Generic drug: Fluticasone Propionate(sensor) ?  ?aspirin 81 MG EC tablet ?  ? ?  ? ?TAKE these medications   ? ?acetaminophen 500 MG tablet ?Commonly known as: TYLENOL ?Take 500 mg by mouth 3 (three) times daily as needed for mild pain, moderate pain or headache. ?  ?albuterol 108 (90 Base) MCG/ACT inhaler ?Commonly known as: VENTOLIN HFA ?Inhale 2 puffs into the lungs 4 (four) times daily as needed for shortness of breath. ?  ?Asmanex (60 Metered Doses) 220 MCG/ACT inhaler ?Generic drug: mometasone ?Inhale 2 puffs into the lungs at bedtime as needed (for flares). ?  ?atorvastatin 80 MG tablet ?Commonly known as: LIPITOR ?Take 80 mg by mouth at bedtime. ?  ?cinacalcet 30 MG tablet ?Commonly known as: SENSIPAR ?Take 30 mg by mouth See admin instructions. Take 30 mg by mouth prior to dialysis on Tues/Thurs/Sat ?  ?clopidogrel 75 MG tablet ?Commonly known as: PLAVIX ?Take 1 tablet (75 mg total) by mouth daily with breakfast. Restart after 5 days ?What changed: additional instructions ?  ?diphenhydrAMINE 25 mg capsule ?Commonly known as: BENADRYL ?Take 25 mg by mouth daily as needed for itching. ?  ?Ensure Plus Liqd ?Take 237 mLs by mouth 2 (two) times daily between meals. Chocolate ?  ?HYDROcodone-acetaminophen 5-325 MG tablet ?Commonly known as: NORCO/VICODIN ?Take 1 tablet by mouth every 12 (twelve) hours as needed for moderate pain. ?  ?insulin NPH-regular Human (70-30) 100 UNIT/ML injection ?Inject 10 Units into the skin daily with breakfast. Takes 20 units in the morning if blood sugar is over 140 ?What changed:  ?how much to take ?when to take this ?additional instructions ?  ?midodrine 5 MG tablet ?Commonly  known as: PROAMATINE ?Take 1 tablet (5 mg total) by mouth 3 (three) times daily with meals. ?  ?nitroGLYCERIN 0.4 MG SL tablet ?Commonly known as: NITROSTAT ?Place 1 tablet (0.4 mg total) under the tongue every 5 (five) minutes x 3 doses as needed for chest pain. ?  ?psyllium 95 % Pack ?Commonly known as: HYDROCIL/METAMUCIL ?Take 1 packet by mouth daily. ?  ? ?  ? ? Follow-up Information   ? ? Windy Fast, MD Follow up.   ?Specialty: Internal Medicine ?Contact information: ?[address] ?[place] [address] ? ? ?  ?  ? ? Belva Crome, MD .   ?Specialty: Cardiology ?Contact information: ?[address] ?[address] ?[place] [address] ?[phone] ? ? ?  ?  ? ? Deboraha Sprang, MD .   ?Specialty: Cardiology ?Contact information: ?[address] ?[address] ?[place] [address] ?[phone] ? ? ?  ?  ? ? Care, [facility] Follow up.   ?Specialty: Home Health Services ?Contact information: ?[address] ?[address] ?[place] [address] ?[phone] ? ? ?  ?  ? ? Otis Brace, MD. Schedule an appointment as soon as possible for a visit in 2 week(s).   ?Specialty: Gastroenterology ?Contact information: ?[address] ?[place] [address] ?[phone] ? ? ?  ?  ? ?  ?  ? ?  ? ?  Allergies  ?Allergen Reactions  ? Hydrochlorothiazide Nausea And Vomiting  ? Isosorbide Dinitrate Other (See Comments)  ?  Lowered the B/P too far  ? ? ?Consultations: ?[facility] gastroenterology ?Cardiology ?Nephrology ?Palliative care ? ? ?Procedures/Studies: ?NM GI Blood Loss ? ?Result Date: 05/17/2021 ?CLINICAL DATA:  History of diverticulitis and in stage renal disease on dialysis. Recent admission for GI bleed. EXAM: NUCLEAR MEDICINE GASTROINTESTINAL BLEEDING SCAN TECHNIQUE: Sequential abdominal images were obtained following intravenous administration of Tc-2mlabeled red blood cells. RADIOPHARMACEUTICALS:  26.2 mCi Tc-933mertechnetate in-vitro labeled red cells. COMPARISON:  CT AP 06/06/2020  FINDINGS: Dynamic imaging was performed for 120 minutes. No abnormal foci of increased radiotracer uptake is identified to suggest active GI bleed. IMPRESSION: No signs to suggest active GI bleed. Electro

## 2021-05-19 NOTE — Plan of Care (Signed)

## 2021-05-19 NOTE — Progress Notes (Signed)
Occupational Therapy Treatment ?Patient Details ?Name: Nicholas [name] ?MRN: [id number] ?DOB: [birth year] ?Today's Date: 05/19/2021 ? ? ?History of present illness Pt is an 88 y.o. male recently admitted 4/19-4/21/23- with lower GIB, now readmitted 05/15/21 with syncopal episode (LOC lasting several minutes), bloody BM. Per workup, suspect syncope related to vasovagal. Pt had GI bleeding scan 4/24, no signs to suggest active GIB. PMH includes ischemic L MCA stroke (03/2021), ESRD (HD TTS), DM2, CAD, arthritis. ?  ?OT comments ? Patient received in recliner and agreeable to OT session. Patient was able to get to EOB and transfer to recliner with min guard assist. Patient ambulated bathroom and was min guard for clothing management and was able to perform toilet hygiene seated. Patient was min guard for standing at sink for hand hygiene. Patient instructed on reacher use for doffing socks and sock aide for donning with min assist. Patient demonstrated good understanding of AE use and says he will pursue further. Patient is making good progress and will continue to be followed by acute OT in this setting.   ? ?Recommendations for follow up therapy are one component of a multi-disciplinary discharge planning process, led by the attending physician.  Recommendations may be updated based on patient status, additional functional criteria and insurance authorization. ?   ?Follow Up Recommendations ? No OT follow up  ?  ?Assistance Recommended at Discharge Intermittent Supervision/Assistance  ?Patient can return home with the following ? Assistance with cooking/housework;Assist for transportation;Direct supervision/assist for medications management ?  ?Equipment Recommendations ? None recommended by OT  ?  ?Recommendations for Other Services   ? ?  ?Precautions / Restrictions Precautions ?Precautions: Fall;Other (comment) ?Precaution Comments: h/o syncopal episode leading to admission ?Restrictions ?Weight Bearing Restrictions: No   ? ? ?  ? ?Mobility Bed Mobility ?Overal bed mobility: Modified Independent ?Bed Mobility: Supine to Sit ?  ?  ?Supine to sit: Supervision, HOB elevated ?  ?  ?General bed mobility comments: patient able to get to EOB with increased time and supervision ?  ? ?Transfers ?Overall transfer level: Needs assistance ?Equipment used: Rolling walker (2 wheels) ?Transfers: Sit to/from Stand ?Sit to Stand: Min guard ?  ?  ?  ?  ?  ?General transfer comment: performed transfers to recliner and toilet ?  ?  ?Balance Overall balance assessment: Needs assistance ?Sitting-balance support: No upper extremity supported, Feet supported ?Sitting balance-Leahy Scale: Good ?  ?  ?Standing balance support: No upper extremity supported ?Standing balance-Leahy Scale: Fair ?Standing balance comment: able to stand at sink for hand hygiene without UE support ?  ?  ?  ?  ?  ?  ?  ?  ?  ?  ?  ?   ? ?ADL either performed or assessed with clinical judgement  ? ?ADL Overall ADL's : Needs assistance/impaired ?  ?  ?Grooming: Wash/dry hands;Min guard;Standing ?Grooming Details (indicate cue type and reason): stood at sink for hand hygeine following toileting ?  ?  ?  ?  ?  ?  ?Lower Body Dressing: Minimal assistance;With adaptive equipment;Sitting/lateral leans ?Lower Body Dressing Details (indicate cue type and reason): education on reacher and sock aide use for LB dressing. Patient instructed on doffing socks with reacher and donning with sock aid and min assist to perform due to difficulty getting sock on sock aide. ?Toilet Transfer: Press photographer (2 wheels) ?Toilet Transfer Details (indicate cue type and reason): transferred to regular toilet with 3n1 over commode ?Toileting- Clothing Manipulation and Hygiene: Sitting/lateral lean;Min  guard;Supervision/safety ?Toileting - Clothing Manipulation Details (indicate cue type and reason): performed toilet hygiene seated and min guard for clothing management ?  ?  ?   ?  ?  ? ?Extremity/Trunk Assessment   ?  ?  ?  ?  ?  ? ?Vision   ?  ?  ?Perception   ?  ?Praxis   ?  ? ?Cognition Arousal/Alertness: Awake/alert ?Behavior During Therapy: [facility] for tasks assessed/performed ?Overall Cognitive Status: Within Functional Limits for tasks assessed ?  ?  ?  ?  ?  ?  ?  ?  ?  ?  ?  ?  ?  ?  ?  ?  ?General Comments: demonstrated good understanding of AE use ?  ?  ?   ?Exercises   ? ?  ?Shoulder Instructions   ? ? ?  ?General Comments    ? ? ?Pertinent Vitals/ Pain       Pain Assessment ?Pain Assessment: No/denies pain ? ?Home Living   ?  ?  ?  ?  ?  ?  ?  ?  ?  ?  ?  ?  ?  ?  ?  ?  ?  ?  ? ?  ?Prior Functioning/Environment    ?  ?  ?  ?   ? ?Frequency ? Min 2X/week  ? ? ? ? ?  ?Progress Toward Goals ? ?OT Goals(current goals can now be found in the care plan section) ? Progress towards OT goals: Progressing toward goals ? ?Acute Rehab OT Goals ?Patient Stated Goal: go home ?OT Goal Formulation: With patient ?Time For Goal Achievement: 05/31/21 ?Potential to Achieve Goals: Good ?ADL Goals ?Pt Will Perform Grooming: Independently;standing ?Pt Will Perform Lower Body Dressing: with modified independence;sit to/from stand ?Pt Will Transfer to Toilet: Independently;ambulating;regular height toilet;grab bars ?Pt Will Perform Toileting - Clothing Manipulation and hygiene: Independently;sit to/from stand ?Additional ADL Goal #1: Pt will be independent in and OOB for basic ADLs  ?Plan Discharge plan remains appropriate   ? ?Co-evaluation ? ? ?   ?  ?  ?  ?  ? ?  ?AM-PAC OT "6 Clicks" Daily Activity     ?Outcome Measure ? ? Help from another person eating meals?: None ?Help from another person taking care of personal grooming?: A Little ?Help from another person toileting, which includes using toliet, bedpan, or urinal?: A Little ?Help from another person bathing (including washing, rinsing, drying)?: A Little ?Help from another person to put on and taking off regular upper body clothing?: A  Little ?Help from another person to put on and taking off regular lower body clothing?: A Little ?6 Click Score: 19 ? ?  ?End of Session Equipment Utilized During Treatment: Rolling walker (2 wheels);Oxygen ? ?OT Visit Diagnosis: Unsteadiness on feet (R26.81) ?  ?Activity Tolerance Patient tolerated treatment well ?  ?Patient Left in chair;with call bell/phone within reach;with chair alarm set ?  ?Nurse Communication Mobility status ?  ? ?   ? ?Time: 0737-1062 ?OT Time Calculation (min): 32 min ? ?Charges: OT General Charges ?$OT Visit: 1 Visit ?OT Treatments ?$Self Care/Home Management : 23-37 mins ? ?Lodema Hong, OTA ?Acute Rehabilitation Services  ?Pager [phone] ?Office [phone] ? ? ?[place] ?05/19/2021, 12:09 PM ?

## 2021-05-20 ENCOUNTER — Other Ambulatory Visit: Payer: Self-pay | Admitting: *Deleted

## 2021-05-20 NOTE — Patient Outreach (Signed)
[facility]) Care Management ? ?05/20/2021 ? ?[name] ?[birth year] ?[id number] ? ? ? ?Member admitted to hospital 4/19-4/21 for GI bleed and again 4/22-4/26 for vasovagal syncope related to bleed.  Call placed to member/wife, unsuccessful.  HIPAA compliant voice message left, will follow up within the next 3-4 business days. ? ?Valente David, RN, MSN, CCM ?[facility] Care Management  ?Community Care Manager ?[phone] ? ? ?

## 2021-05-21 DIAGNOSIS — N186 End stage renal disease: Secondary | ICD-10-CM | POA: Diagnosis not present

## 2021-05-21 DIAGNOSIS — R918 Other nonspecific abnormal finding of lung field: Secondary | ICD-10-CM | POA: Diagnosis not present

## 2021-05-21 DIAGNOSIS — I1311 Hypertensive heart and chronic kidney disease without heart failure, with stage 5 chronic kidney disease, or end stage renal disease: Secondary | ICD-10-CM | POA: Diagnosis not present

## 2021-05-21 DIAGNOSIS — Z7902 Long term (current) use of antithrombotics/antiplatelets: Secondary | ICD-10-CM | POA: Diagnosis not present

## 2021-05-21 DIAGNOSIS — D631 Anemia in chronic kidney disease: Secondary | ICD-10-CM | POA: Diagnosis not present

## 2021-05-21 DIAGNOSIS — I083 Combined rheumatic disorders of mitral, aortic and tricuspid valves: Secondary | ICD-10-CM | POA: Diagnosis not present

## 2021-05-21 DIAGNOSIS — R29898 Other symptoms and signs involving the musculoskeletal system: Secondary | ICD-10-CM | POA: Diagnosis not present

## 2021-05-21 DIAGNOSIS — Z7951 Long term (current) use of inhaled steroids: Secondary | ICD-10-CM | POA: Diagnosis not present

## 2021-05-21 DIAGNOSIS — Z9981 Dependence on supplemental oxygen: Secondary | ICD-10-CM | POA: Diagnosis not present

## 2021-05-21 DIAGNOSIS — I252 Old myocardial infarction: Secondary | ICD-10-CM | POA: Diagnosis not present

## 2021-05-21 DIAGNOSIS — Z794 Long term (current) use of insulin: Secondary | ICD-10-CM | POA: Diagnosis not present

## 2021-05-21 DIAGNOSIS — E1122 Type 2 diabetes mellitus with diabetic chronic kidney disease: Secondary | ICD-10-CM | POA: Diagnosis not present

## 2021-05-21 DIAGNOSIS — Z7982 Long term (current) use of aspirin: Secondary | ICD-10-CM | POA: Diagnosis not present

## 2021-05-21 DIAGNOSIS — Z9181 History of falling: Secondary | ICD-10-CM | POA: Diagnosis not present

## 2021-05-21 DIAGNOSIS — E7800 Pure hypercholesterolemia, unspecified: Secondary | ICD-10-CM | POA: Diagnosis not present

## 2021-05-21 DIAGNOSIS — Z95818 Presence of other cardiac implants and grafts: Secondary | ICD-10-CM | POA: Diagnosis not present

## 2021-05-21 DIAGNOSIS — Z955 Presence of coronary angioplasty implant and graft: Secondary | ICD-10-CM | POA: Diagnosis not present

## 2021-05-21 DIAGNOSIS — J90 Pleural effusion, not elsewhere classified: Secondary | ICD-10-CM | POA: Diagnosis not present

## 2021-05-21 DIAGNOSIS — I69398 Other sequelae of cerebral infarction: Secondary | ICD-10-CM | POA: Diagnosis not present

## 2021-05-21 DIAGNOSIS — I2510 Atherosclerotic heart disease of native coronary artery without angina pectoris: Secondary | ICD-10-CM | POA: Diagnosis not present

## 2021-05-21 DIAGNOSIS — I69328 Other speech and language deficits following cerebral infarction: Secondary | ICD-10-CM | POA: Diagnosis not present

## 2021-05-22 NOTE — Discharge Summary (Signed)
Physician Discharge Summary  ?Nicholas [name] QZE:[id number] DOB: 1930/10/10 DOA: 05/12/2021 ? ?PCP: Windy Fast, MD ? ?Admit date: 05/12/2021 ?Discharge date: 05/14/2021 ? ?Time spent: 35 minutes ? ?Recommendations for Outpatient Follow-up:  ?PCP in 1 week, please check CBC at follow-up ?Gastroenterology in 1 month ?Please continue goals of care, CODE STATUS discussions ? ? ?Discharge Diagnoses:  ?Principal Problem: ?Diverticular bleeding ?  ESRD (end stage renal disease) on dialysis [facility]) ?  Type II diabetes mellitus with renal manifestations ([place]) ?  CAD (coronary artery disease) ?  Acute blood loss anemia ?  Stroke [facility]) ?  Chronic respiratory insufficiency ? ? ?Discharge Condition: Stable ? ?Diet recommendation: Renal diabetic ? ?Filed Weights  ? 05/12/21 2015 05/13/21 1551 05/13/21 1748  ?Weight: 70.8 kg 72.2 kg 70.4 kg  ? ? ?History of present illness:  ?88 y.o. male with medical history significant for ESRD on hemodialysis, type 2 diabetes mellitus, CAD, and ischemic left MCA stroke 1 month ago, presented to emergency department after 2 bloody stools.   He has been taking aspirin and Plavix since the recent stroke ?-In the ED hemoglobin was 6.6 ? ?Hospital Course:  ? ?Acute lower GI bleeding, likely diverticular ?Acute blood loss anemia ?-Transfused 1 unit of PRBC yesterday ?-Bleeding likely diverticular, pandiverticulosis noted on colonoscopy 6/22 ?-Gastroenterology following, conservative management  ?-No further bleeding, advanced to regular diet ?-Seen by gastroenterology restarted aspirin and Plavix today, it has been emphasized that with need for dual antiplatelet therapy and pandiverticulosis he will remain at risk for recurrent diverticular bleeding ?-Hemoglobin stable at 7.5, anemia panel consistent with chronic disease, feels well, anxious to go home, discharged home in a stable condition in the absence of ongoing active bleeding ?  ?Hx recent ischemic CVA  ?- Admitted 04/15/21 with ischemic left MCA  stroke, reports some subtle difficulty with complex tasks but is improving  ?-Restarting dual antiplatelet therapy yesterday ?  ?ESRD  ?- Last dialyzed 4/18  ?-Nephrology consulted, dialyzed yesterday ?  ?Chronic hypoxic respiratory failure/COPD ?- Stable on his usual 2 Lpm supplemental O2  ?  ?CAD  ?- No anginal complaints  ?-Resumed antiplatelet agents ?  ?Type II DM  ?- A1c was 7.6% in March 2023  ?-On insulin 70/30 at baseline, stable ?  ? ?  ?Consultants: Gastroenterology ? ? ? ?Discharge Exam: ?Vitals:  ? 05/14/21 1120 05/14/21 1544  ?BP: (!) 96/44 115/60  ?Pulse: 81 92  ?Resp: 17 20  ?Temp: 97.8 ?F (36.6 ?C) 98 ?F (36.7 ?C)  ?SpO2: 100% 100%  ? ? ? ?Discharge Instructions ? ? ?Discharge Instructions   ? ? Diet - low sodium heart healthy   Complete by: As directed ?  ? Diet Carb Modified   Complete by: As directed ?  ? Increase activity slowly   Complete by: As directed ?  ? ?  ? ?Allergies as of 05/14/2021   ? ?   Reactions  ? Hydrochlorothiazide Other (See Comments)  ? Unknown per [place] [facility] records  ? ?  ? ?  ?Medication List  ?  ? ?TAKE these medications   ? ?acetaminophen 500 MG tablet ?Commonly known as: TYLENOL ?Take 500 mg by mouth 3 (three) times daily as needed for mild pain, moderate pain or headache. ?  ?atorvastatin 80 MG tablet ?Commonly known as: LIPITOR ?Take 80 mg by mouth at bedtime. ?  ?cinacalcet 30 MG tablet ?Commonly known as: SENSIPAR ?Take 30 mg by mouth See admin instructions. Take 30 mg by mouth prior to dialysis on  Tues/Thurs/Sat ?  ?clopidogrel 75 MG tablet ?Commonly known as: PLAVIX ?Take 1 tablet (75 mg total) by mouth daily with breakfast. Restart after 5 days ?What changed: additional instructions ?  ?diphenhydrAMINE 25 mg capsule ?Commonly known as: BENADRYL ?Take 25 mg by mouth daily as needed for itching. ?  ?Ensure Plus Liqd ?Take 237 mLs by mouth 2 (two) times daily between meals. Chocolate ?  ?HYDROcodone-acetaminophen 5-325 MG tablet ?Commonly known as: NORCO/VICODIN ?Take 1  tablet by mouth every 12 (twelve) hours as needed for moderate pain. ?  ?insulin NPH-regular Human (70-30) 100 UNIT/ML injection ?Inject 10 Units into the skin daily with breakfast. Takes 20 units in the morning if blood sugar is over 140 ?What changed:  ?how much to take ?when to take this ?additional instructions ?  ?midodrine 5 MG tablet ?Commonly known as: PROAMATINE ?Take 1 tablet (5 mg total) by mouth 3 (three) times daily with meals. ?  ?nitroGLYCERIN 0.4 MG SL tablet ?Commonly known as: NITROSTAT ?Place 1 tablet (0.4 mg total) under the tongue every 5 (five) minutes x 3 doses as needed for chest pain. ?  ?psyllium 95 % Pack ?Commonly known as: HYDROCIL/METAMUCIL ?Take 1 packet by mouth daily. ?  ? ?  ? ?Allergies  ?Allergen Reactions  ? Hydrochlorothiazide Nausea And Vomiting  ? Isosorbide Dinitrate Other (See Comments)  ?  Lowered the B/P too far  ? ? Follow-up Information   ? ? Care, [facility] Follow up.   ?Specialty: Home Health Services ?Why: Physical Therapy-office to call with visit times ?Contact information: ?[address] ?[address] ?[place] [address] ?[phone] ? ? ?  ?  ? ?  ?  ? ?  ? ? ? ?The results of significant diagnostics from this hospitalization (including imaging, microbiology, ancillary and laboratory) are listed below for reference.   ? ?Significant Diagnostic Studies: ?NM GI Blood Loss ? ?Result Date: 05/17/2021 ?CLINICAL DATA:  History of diverticulitis and in stage renal disease on dialysis. Recent admission for GI bleed. EXAM: NUCLEAR MEDICINE GASTROINTESTINAL BLEEDING SCAN TECHNIQUE: Sequential abdominal images were obtained following intravenous administration of Tc-74mlabeled red blood cells. RADIOPHARMACEUTICALS:  26.2 mCi Tc-967mertechnetate in-vitro labeled red cells. COMPARISON:  CT AP 06/06/2020 FINDINGS: Dynamic imaging was performed for 120 minutes. No abnormal foci of increased radiotracer uptake is identified to suggest active GI bleed. IMPRESSION: No  signs to suggest active GI bleed. Electronically Signed   By: TaKerby Moors.D.   On: 05/17/2021 15:25  ? ?DG Chest Port 1 View ? ?Result Date: 05/15/2021 ?CLINICAL DATA:  Syncope. End-stage renal disease. Loss of consciousness. EXAM: PORTABLE CHEST 1 VIEW COMPARISON:  AP chest 04/13/2021 and 09/09/2020 FINDINGS: Cardiac silhouette is again moderately enlarged. Mediastinal contours are unchanged and within normal limits. Cardiac loop recorder again overlies the left hemithorax. Small right pleural effusion with associated basilar opacification. Increased lucencies within the upper lungs are again seen suggesting chronic emphysematous change. The previously questioned nodular right upper lung density is less apparent and may have previously represented the head of the right first rib. The current appearance appears very similar to 09/09/2020 radiograph. The left lung is clear. Moderate multilevel degenerative bridging osteophytes of the thoracic spine. IMPRESSION: Stable small right pleural effusion with right basilar atelectasis. Cannot exclude underlying pneumonia. Electronically Signed   By: RoYvonne Kendall.D.   On: 05/15/2021 17:14  ? ?CUP PACEART REMOTE DEVICE CHECK ? ?Result Date: 04/26/2021 ?ILR summary report received. Battery status OK. Normal device function. No  new symptom, tachy, brady, or pause episodes. No new AF episodes. Monthly summary reports and ROV/PRN LA  ? ?Microbiology: ?No results found for this or any previous visit (from the past 240 hour(s)).  ? ?Labs: ?Basic Metabolic Panel: ?Recent Labs  ?Lab 05/16/21 ?0426 05/16/21 ?2235 05/17/21 ?1022 05/17/21 ?1212 05/18/21 ?0622  ?NA 141 137 139 138 135  ?K 4.4 5.0 5.0 4.3 3.8  ?CL 101 97* 99 99 95*  ?CO2 '26 24 22 24 28  '$ ?GLUCOSE 73 182* 255* 84 135*  ?BUN 71* 82* 86* 67* 30*  ?CREATININE 8.61* 9.44* 10.50* 7.96* 5.61*  ?CALCIUM 7.8* 7.7* 7.7* 7.9* 8.2*  ?PHOS  --   --  6.0* 4.4  --   ? ?Liver Function Tests: ?Recent Labs  ?Lab 05/15/21 ?1634  05/17/21 ?1022 05/17/21 ?1212  ?AST 12*  --   --   ?ALT 12  --   --   ?ALKPHOS 83  --   --   ?BILITOT 0.5  --   --   ?PROT 6.1*  --   --   ?ALBUMIN 2.7* 2.6* 2.6*  ? ?No results for input(s): LIPASE, AMYLASE in the last

## 2021-05-24 ENCOUNTER — Other Ambulatory Visit: Payer: Self-pay | Admitting: *Deleted

## 2021-05-24 NOTE — Patient Outreach (Signed)
?[facility]) Care Management ?Telephonic RN Care Manager Note ? ? ?05/24/2021 ?Name:  Nicholas [name] MRN:  [id number] DOB:  [birth year] ? ?Summary: ?Outgoing call placed to member, successful.  Wife also present during conversation.  Denies any urgent concerns, encouraged to contact this care manager with questions.   ? ?Subjective: ?Nicholas [name] is an 88 y.o. year old male who is a primary patient of Windy Fast, MD. The care management team was consulted for assistance with care management and/or care coordination needs.   ? ?Telephonic RN Care Manager completed Telephone Visit today. ? ?Objective:  ? ?Medications Reviewed Today   ? ? Reviewed by Dessie Coma, CPhT (Pharmacy Technician) on 05/16/21 at 2109  Med List Status: Complete  ? ?Medication Order Taking? Sig Documenting Provider Last Dose Status Informant  ?acetaminophen (TYLENOL) 500 MG tablet [id number] Yes Take 500 mg by mouth 3 (three) times daily as needed for mild pain, moderate pain or headache. [provider] unk Active Self  ?albuterol (VENTOLIN HFA) 108 (90 Base) MCG/ACT inhaler [id number] Yes Inhale 2 puffs into the lungs 4 (four) times daily as needed for shortness of breath. [provider] unk Active Multiple Informants  ?         ?Med Note Duffy Bruce, Legrand Como   Sun May 16, 2021  7:45 PM) CONFIRMED BY [facility] (I called them)  ?aspirin EC 81 MG EC tablet [id number] Yes Take 1 tablet (81 mg total) by mouth daily. Swallow whole. [name], MD 05/15/2021 0800 Active Self  ?atorvastatin (LIPITOR) 80 MG tablet [id number] Yes Take 80 mg by mouth at bedtime. [provider] unk Active Self  ?cinacalcet (SENSIPAR) 30 MG tablet [id number] Yes Take 30 mg by mouth See admin instructions. Take 30 mg by mouth prior to dialysis on Tues/Thurs/Sat [provider] Past Week Active Self  ?clopidogrel (PLAVIX) 75 MG tablet [id number] Yes Take 1 tablet (75 mg total) by mouth daily with breakfast. Restart after 5 days   ?Patient taking differently: Take 75 mg by mouth daily with breakfast.  ? Domenic Polite, MD 05/15/2021 0800 Active Self  ?diphenhydrAMINE (BENADRYL) 25 mg capsule [id number] Yes Take 25 mg by mouth daily as needed for itching. [provider] unk Active Self  ?Ensure Plus (ENSURE PLUS) LIQD [id number] Yes Take 237 mLs by mouth 2 (two) times daily between meals. Chocolate [provider] Past Week Active Self  ?Fluticasone Propionate,sensor, (ARMONAIR DIGIHALER) 232 MCG/ACT AEPB [id number] No Inhale 1 puff into the lungs 2 (two) times daily as needed (shortness of breath).  ?Patient not taking: Reported on 05/16/2021  ? [provider] Not Taking Active Self  ?HYDROcodone-acetaminophen (NORCO/VICODIN) 5-325 MG tablet [id number] Yes Take 1 tablet by mouth every 12 (twelve) hours as needed for moderate pain. [provider] Past Week Active Self  ?insulin NPH-regular Human (70-30) 100 UNIT/ML injection [id number] Yes Inject 10 Units into the skin daily with breakfast. Takes 20 units in the morning if blood sugar is over 140  ?Patient taking differently: Inject 20 Units into the skin See admin instructions. Inject 20 units into the skin in the morning if blood sugar is over 140  ? Domenic Polite, MD unk Active Self  ?midodrine (PROAMATINE) 5 MG tablet [id number] Yes Take 1 tablet (5 mg total) by mouth 3 (three) times daily with meals. [name], MD 05/16/2021 1657 Active Self  ?         ?Med Note Duffy Bruce, Legrand Como   Sun May 16, 2021  7:40 PM) Last dose was received at [facility]- per [facility]  ?mometasone (ASMANEX, 60 METERED DOSES,) 220 MCG/ACT inhaler [id number] Yes Inhale 2 puffs into the lungs at bedtime as needed (for flares). [provider] unk Active Multiple Informants  ?         ?Med Note Duffy Bruce, Legrand Como   Sun May 16, 2021  7:45 PM) [facility]MED BY [facility] (I called them)  ?nitroGLYCERIN (NITROSTAT) 0.4 MG SL tablet [id number] Yes Place 1 tablet (0.4 mg total) under the tongue  every 5 (five) minutes x 3 doses as needed for chest pain. [name], MD 05/15/2021 Active Self  ?psyllium (HYDROCIL/METAMUCIL) 95 % PACK [id number] No Take 1 packet by mouth daily.  ?Patient not taking: Reported on 05/16/2021  ? Domenic Polite, MD Not Taking Active Self  ?Med List Note Payton Doughty, CPhT 08/06/18 2010): Dialysis Tuesday, Thursday, Saturday - [place]  ? ?  ?  ? ?  ? ? ? ?SDOH:  (Social Determinants of Health) assessments and interventions performed:  ? ? ? ?Care Plan ? ?Review of patient past medical history, allergies, medications, health status, including review of consultants reports, laboratory and other test data, was performed as part of comprehensive evaluation for care management services.  ? ?Care Plan : [facility] Plan of Care (Adult)  ?Updates made by Valente David, RN since 05/24/2021 12:00 AM  ?  ? ?Problem: Care Coordination and knowledge needs post hospital discharge for stroke   ?Priority: High  ?  ? ?Long-Range Goal: Member and family will adequately manage chronic conditions and decrease risk for recurrent stroke   ?Start Date: 04/23/2021  ?Expected End Date: 10/20/2021  ?This Visit's Progress: On track  ?Recent Progress: On track  ?Priority: High  ?Note:   ?Current Barriers:  ?Chronic Disease Management support and education needs related to HLD and Stroke  ? ?RNCM Clinical Goal(s):  ?Patient will verbalize understanding of plan for management of HLD and Stroke as evidenced by Member/family verbalizing adequate plan of care ?take all medications exactly as prescribed and will call provider for medication related questions as evidenced by Member/family reporting adherence ?attend all scheduled medical appointments: PCP and Neurology as evidenced by Member/family report attendance ?continue to work with RN Care Manager to address care management and care coordination needs related to  HLD and Stroke as evidenced by adherence to CM Team Scheduled appointments ?work with  home health to attend therapy sessions with PT/OT as evidenced by increased strength and indpendence  through collaboration with Consulting civil engineer, provider, and care team.  ? ?Interventions: ?Inter-disciplinary care team collaboration (see longitudinal plan of care) ?Evaluation of current treatment plan related to  self management and patient's adherence to plan as established by provider ? ? ?Hyperlipidemia Interventions:  (Status:  Goal on track:  Yes.) Long Term Goal ?Medication review performed; medication list updated in electronic medical record.  ?Provider established cholesterol goals reviewed ?Provided HLD educational materials ?Reviewed importance of limiting foods high in cholesterol ? ?Stroke:  (Status:Goal on track:  Yes.) Long Term Goal ?Reviewed Importance of taking all medications as prescribed ?Advised to report any changes in symptoms or exercise tolerance ?Assessed for signs and symptoms of stroke ?Reviewed referrals to home health ?Reviewed the importance of exercise ?Assessed for cognitive impairment ?Assessed for fall status and safety in the home ? ?Patient Goals/Self-Care Activities: ?call for medicine refill 2 or 3 days before it runs out ?take all medications exactly as prescribed ?call doctor with any symptoms you  believe are related to your medicine ? ?Follow Up Plan:  The patient has been provided with contact information for the care management team and has been advised to call with any health related questions or concerns.  ? ? ?Update 4/14 - Spoke with wife, she report member is improving.  He has been gaining strength and using his right side more.  Walking is more stable with walker and he is able to write more legibly now.  He remains active with home health PT/OT through [place].  Was seen by PCP on 4/7 at the [place], has also seen neurology at the [facility] since last outreach on 3/31.  Blood pressure has remained stable with Midodrine, unsure what it was today as home health assessed it  earlier (wasn't told what it was but that it was "good.")  He has not experienced any signs/symptoms of recurrent stroke, denies other complaints. ? ? ?Update 5/1 - Member admitted to hospital 4/19-4/21 f

## 2021-05-25 ENCOUNTER — Encounter: Payer: Self-pay | Admitting: Physician Assistant

## 2021-05-25 ENCOUNTER — Ambulatory Visit: Payer: Medicare Other | Admitting: *Deleted

## 2021-05-25 NOTE — Progress Notes (Deleted)
Cardiology Office Note    Date:  05/25/2021   ID:  Nicholas [name], DOB 02-13-1930, MRN [id number]  PCP:  Nicholas Fast, MD  Cardiologist:  Nicholas Grooms, MD  Electrophysiologist:  Nicholas Axe, MD   Chief Complaint: f/u CAD  History of Present Illness:   Nicholas [name] is a 88 y.o. male with history of CAD with NSTEMI 2020 s/p atherectomy/DES to prox LAD, ESRD on HD, syncope s/p MDT ILR 04/2019, prior intermittent AVB/sinus bradycardia in context of n/v felt c/w hypervagotonia, COPD with chronic respiratory failure on home O2, stroke, GIB, anemia of chronic disease s/p prior transfusions, arthritis, DM, GERD, psoriasis, HTN, HLD, pleural effusion s/p prior thoracentesis who is seen for follow-up today. He is followed by Dr. Tamala [name] for general cardiology and Dr. Caryl [name] for his h/o syncope. Last cath 05/2020 showed findings below - per cath report, "The patient's age, comorbidities, and unwillingness to consider coronary bypass grafting leave few options for management. PCI would be single remaining vessel and high risk. Discussed with patient who prefers conservative management at this point." With regards to his ILR, there have been prior episodes of intermittent bradycardia/AV block noted but these occurred primarily in the setting of n/v, felt alluding to a vagal etiology, and pacemaker has not been pursued. He has had multiple hospital admissions lately. He was admitted 03/2021 with stroke requiring DAPT (loop reported to not show any afib, ASA added to PTA Plavix). He was readmitted twice in 04/2021 initially for GIB then readmitted soon after with syncope felt due to vasovagal episode in setting of worsening anemia and lower GIB felt secondary to pandiverticulosis. He also had chest pain. Dr. Judeth [name] note indicates syncope was felt to be vasovagal. Conservative approach recommended. He was discharged on Plavix monotherapy. Palliative care was consulted and GOC was to stabilize patient to return  home to his family, full scope of care, full code.  Device interrog?  CAD H/o recurrent syncope s/p MDT ILR with prior AV block/bradycardia Essential HTN Hyperlipidemia History of stroke    Labwork independently reviewed: 04/2021 Hgb 7.1, plt wnl, K 3.8, Cr 5.61, troponin peak 459, A1c 7, LFTS ok except albumin 2.7 03/2021 LDL 116, trig 87 08/2020 TSH wnl   Cardiology Studies:   Studies reviewed are outlined and summarized above. Reports included below if pertinent.   2d echo 04/16/21   1. Left ventricular ejection fraction, by estimation, is 60 to 65%. The  left ventricle has normal function. The left ventricle has no regional  wall motion abnormalities. Left ventricular diastolic parameters are  consistent with Grade I diastolic  dysfunction (impaired relaxation).   2. Right ventricular systolic function is normal. The right ventricular  size is normal. There is mildly elevated pulmonary artery systolic  pressure.   3. Left atrial size was mildly dilated.   4. The mitral valve is grossly normal. Trivial mitral valve  regurgitation. No evidence of mitral stenosis.   5. The aortic valve has an indeterminant number of cusps. There is  moderate calcification of the aortic valve. There is mild thickening of  the aortic valve. Aortic valve regurgitation is not visualized. Aortic  valve sclerosis is present, with no  evidence of aortic valve stenosis.   6. The inferior vena cava is normal in size with greater than 50%  respiratory variability, suggesting right atrial pressure of 3 mmHg.   Comparison(s): No significant change from prior study.   Conclusion(s)/Recommendation(s): Otherwise normal echocardiogram, with  minor abnormalities described in the  report. No intracardiac source of  embolism detected on this transthoracic study. Consider a transesophageal  echocardiogram to exclude cardiac  source of embolism if clinically indicated.   Cath 05/2020 Mid LAD lesion is 60%  stenosed.   40 to 60% eccentric distal left main not greatly different than on prior angiography.  Left main is difficult to lay out and not easily visualized due to a loop recorder that block imaging. The peviously stented proximal LAD contains 30% in-stent restenosis.  The jailed diagonal is totally occluded but supplied by collaterals.  The mid LAD beyond the stent contains a 50-70 % eccentric narrowing unchanged from prior imaging. Circumflex territory is small.  The proximal vessel contains 80% stenosis and is unchanged compared to the prior angiogram.  A large obtuse marginal is totally occluded and supplied by left to left collaterals. Right coronary is totally occluded in the distal vessel receives right to right and left to right collaterals. LVEDP 10 mmHg.  Ventriculography was not performed.   RECOMMENDATIONS:   Compared to prior images, left main is about the same as before.  Symptoms could be related to the left main.  The patient's age, comorbidities, and unwillingness to consider coronary bypass grafting leave few options for management.  PCI would be single remaining vessel and high risk.  Discussed with patient who prefers conservative management at this point. Continue medical therapy. Guarded prognosis.    Past Medical History:  Diagnosis Date   Arthritis    Chronic kidney disease    Diabetes mellitus ([place])    Dialysis patient ([place])    GERD (gastroesophageal reflux disease)    occ   Heart murmur    Hypercholesteremia    Hypertension    Psoriasis     Past Surgical History:  Procedure Laterality Date   CARDIAC CATHETERIZATION  09   "some blockage" with collaterals by cath at [facility] ~ 2009; no intervention required; reportedly, no routine cardiology f/u recommended    COLONOSCOPY WITH PROPOFOL N/A 07/06/2020   Procedure: COLONOSCOPY WITH PROPOFOL;  Surgeon: Nicholas Lobo, MD;  Location: [place];  Service: Endoscopy;  Laterality: N/A;   CORONARY ATHERECTOMY N/A  08/09/2018   Procedure: CORONARY ATHERECTOMY;  Surgeon: Nicholas Booze, MD;  Location: [place] CV LAB;  Service: Cardiovascular;  Laterality: N/A;   CORONARY STENT INTERVENTION N/A 08/09/2018   Procedure: CORONARY STENT INTERVENTION;  Surgeon: Nicholas Booze, MD;  Location: [place] CV LAB;  Service: Cardiovascular;  Laterality: N/A;   ESOPHAGOGASTRODUODENOSCOPY (EGD) WITH PROPOFOL N/A 07/06/2020   Procedure: ESOPHAGOGASTRODUODENOSCOPY (EGD) WITH PROPOFOL;  Surgeon: Nicholas Lobo, MD;  Location: [place];  Service: Endoscopy;  Laterality: N/A;   INGUINAL HERNIA REPAIR Right 04/13/2012   Procedure: HERNIA REPAIR INGUINAL INCARCERATED;  Surgeon: Gayland Curry, MD;  Location: [place];  Service: General;  Laterality: Right;   INSERTION OF MESH Right 04/13/2012   Procedure: INSERTION OF MESH;  Surgeon: Gayland Curry, MD;  Location: [place];  Service: General;  Laterality: Right;   IR THORACENTESIS ASP PLEURAL SPACE W/IMG GUIDE  06/08/2020   LEFT HEART CATH N/A 08/09/2018   Procedure: Left Heart Cath;  Surgeon: Nicholas Booze, MD;  Location: [place] CV LAB;  Service: Cardiovascular;  Laterality: N/A;   LEFT HEART CATH AND CORONARY ANGIOGRAPHY N/A 08/08/2018   Procedure: LEFT HEART CATH AND CORONARY ANGIOGRAPHY;  Surgeon: Troy Sine, MD;  Location: [place] CV LAB;  Service: Cardiovascular;  Laterality: N/A;   LEFT HEART CATH AND CORONARY  ANGIOGRAPHY N/A 06/05/2020   Procedure: LEFT HEART CATH AND CORONARY ANGIOGRAPHY;  Surgeon: Belva Crome, MD;  Location: [place] CV LAB;  Service: Cardiovascular;  Laterality: N/A;   VASECTOMY      Current Medications: No outpatient medications have been marked as taking for the 06/01/21 encounter (Appointment) with Charlie Pitter, PA-C.   ***   Allergies:   Hydrochlorothiazide and Isosorbide dinitrate   Social History   Socioeconomic History   Marital status: Married    Spouse name: Not on file   Number of children: Not on  file   Years of education: Not on file   Highest education level: Not on file  Occupational History   Not on file  Tobacco Use   Smoking status: Former    Packs/day: 1.00    Years: 15.00    Pack years: 15.00    Types: Cigarettes    Quit date: 03/29/1987    Years since quitting: 34.1   Smokeless tobacco: Never  Substance and Sexual Activity   Alcohol use: No   Drug use: No   Sexual activity: Not on file  Other Topics Concern   Not on file  Social History Narrative   Not on file   Social Determinants of Health   Financial Resource Strain: Not on file  Food Insecurity: No Food Insecurity   Worried About Running Out of Food in the Last Year: Never true   Loyal in the Last Year: Never true  Transportation Needs: No Transportation Needs   Lack of Transportation (Medical): No   Lack of Transportation (Non-Medical): No  Physical Activity: Not on file  Stress: Not on file  Social Connections: Not on file     Family History:  The patient's ***family history includes Diabetes in his brother, mother, and sister; Stroke in his father.  ROS:   Please see the history of present illness. Otherwise, review of systems is positive for ***.  All other systems are reviewed and otherwise negative.    EKG(s)/Additional Labs   EKG:  EKG is ordered today, personally reviewed, demonstrating ***  Recent Labs: 06/04/2020: B Natriuretic Peptide 268.2 09/09/2020: TSH 4.302 09/10/2020: Magnesium 2.0 05/15/2021: ALT 12 05/18/2021: BUN 30; Creatinine, Ser 5.61; Potassium 3.8; Sodium 135 05/19/2021: Hemoglobin 7.1; Platelets 285  Recent Lipid Panel    Component Value Date/Time   CHOL 198 04/16/2021 0210   TRIG 87 04/16/2021 0210   HDL 65 04/16/2021 0210   CHOLHDL 3.0 04/16/2021 0210   VLDL 17 04/16/2021 0210   LDLCALC 116 (H) 04/16/2021 0210    PHYSICAL EXAM:    VS:  There were no vitals taken for this visit.  BMI: There is no height or weight on file to calculate BMI.  GEN:  Well nourished, well developed male in no acute distress HEENT: normocephalic, atraumatic Neck: no JVD, carotid bruits, or masses Cardiac: ***RRR; no murmurs, rubs, or gallops, no edema  Respiratory:  clear to auscultation bilaterally, normal work of breathing GI: soft, nontender, nondistended, + BS MS: no deformity or atrophy Skin: warm and dry, no rash Neuro:  Alert and Oriented x 3, Strength and sensation are intact, follows commands Psych: euthymic mood, full affect  Wt Readings from Last 3 Encounters:  05/18/21 151 lb 7.3 oz (68.7 kg)  05/13/21 155 lb 3.3 oz (70.4 kg)  04/17/21 156 lb 12 oz (71.1 kg)     ASSESSMENT & PLAN:   ***     Disposition: F/u with ***  Medication Adjustments/Labs and Tests Ordered: Current medicines are reviewed at length with the patient today.  Concerns regarding medicines are outlined above. Medication changes, Labs and Tests ordered today are summarized above and listed in the Patient Instructions accessible in Encounters.   Signed, Charlie Pitter, PA-C  05/25/2021 12:53 PM    [place] Phone: [phone]; Fax: [phone]

## 2021-05-26 ENCOUNTER — Telehealth: Payer: Self-pay | Admitting: Internal Medicine

## 2021-05-26 DIAGNOSIS — I69398 Other sequelae of cerebral infarction: Secondary | ICD-10-CM | POA: Diagnosis not present

## 2021-05-26 DIAGNOSIS — N186 End stage renal disease: Secondary | ICD-10-CM | POA: Diagnosis not present

## 2021-05-26 DIAGNOSIS — R29898 Other symptoms and signs involving the musculoskeletal system: Secondary | ICD-10-CM | POA: Diagnosis not present

## 2021-05-26 DIAGNOSIS — I69328 Other speech and language deficits following cerebral infarction: Secondary | ICD-10-CM | POA: Diagnosis not present

## 2021-05-26 DIAGNOSIS — I1311 Hypertensive heart and chronic kidney disease without heart failure, with stage 5 chronic kidney disease, or end stage renal disease: Secondary | ICD-10-CM | POA: Diagnosis not present

## 2021-05-26 DIAGNOSIS — E1122 Type 2 diabetes mellitus with diabetic chronic kidney disease: Secondary | ICD-10-CM | POA: Diagnosis not present

## 2021-05-26 NOTE — Telephone Encounter (Signed)
Error

## 2021-05-27 ENCOUNTER — Other Ambulatory Visit: Payer: Self-pay | Admitting: *Deleted

## 2021-05-27 NOTE — Patient Outreach (Signed)
[facility]) Care Management ? ?05/27/2021 ? ?Nicholas [name] ?1930/06/29 ?[id number] ? ? ?Member's case was discussed with multidisciplinary team due to 30 day readmission.  Update provided, member has progressed since discharge, active with provider follow ups, palliative, and home health.  Will follow up with member within the next week as planned. ? ?Valente David, RN, MSN, CCM ?[facility] Care Management  ?Community Care Manager ?[phone] ?  ?

## 2021-05-28 DIAGNOSIS — N186 End stage renal disease: Secondary | ICD-10-CM | POA: Diagnosis not present

## 2021-05-28 DIAGNOSIS — E1122 Type 2 diabetes mellitus with diabetic chronic kidney disease: Secondary | ICD-10-CM | POA: Diagnosis not present

## 2021-05-28 DIAGNOSIS — I69328 Other speech and language deficits following cerebral infarction: Secondary | ICD-10-CM | POA: Diagnosis not present

## 2021-05-28 DIAGNOSIS — I1311 Hypertensive heart and chronic kidney disease without heart failure, with stage 5 chronic kidney disease, or end stage renal disease: Secondary | ICD-10-CM | POA: Diagnosis not present

## 2021-05-28 DIAGNOSIS — R29898 Other symptoms and signs involving the musculoskeletal system: Secondary | ICD-10-CM | POA: Diagnosis not present

## 2021-05-28 DIAGNOSIS — I69398 Other sequelae of cerebral infarction: Secondary | ICD-10-CM | POA: Diagnosis not present

## 2021-05-28 LAB — CUP PACEART REMOTE DEVICE CHECK
Date Time Interrogation Session: 20230503230743
Implantable Pulse Generator Implant Date: 20210412

## 2021-05-28 MED ORDER — TECHNETIUM TC 99M-LABELED RED BLOOD CELLS IV KIT
26.2000 | PACK | Freq: Once | INTRAVENOUS | Status: AC | PRN
  Administered 2021-05-17: 26.2 via INTRAVENOUS

## 2021-05-31 ENCOUNTER — Ambulatory Visit (INDEPENDENT_AMBULATORY_CARE_PROVIDER_SITE_OTHER): Payer: Medicare Other

## 2021-05-31 DIAGNOSIS — I69398 Other sequelae of cerebral infarction: Secondary | ICD-10-CM | POA: Diagnosis not present

## 2021-05-31 DIAGNOSIS — I442 Atrioventricular block, complete: Secondary | ICD-10-CM | POA: Diagnosis not present

## 2021-05-31 DIAGNOSIS — I69328 Other speech and language deficits following cerebral infarction: Secondary | ICD-10-CM | POA: Diagnosis not present

## 2021-05-31 DIAGNOSIS — N186 End stage renal disease: Secondary | ICD-10-CM | POA: Diagnosis not present

## 2021-05-31 DIAGNOSIS — R29898 Other symptoms and signs involving the musculoskeletal system: Secondary | ICD-10-CM | POA: Diagnosis not present

## 2021-05-31 DIAGNOSIS — I1311 Hypertensive heart and chronic kidney disease without heart failure, with stage 5 chronic kidney disease, or end stage renal disease: Secondary | ICD-10-CM | POA: Diagnosis not present

## 2021-05-31 DIAGNOSIS — E1122 Type 2 diabetes mellitus with diabetic chronic kidney disease: Secondary | ICD-10-CM | POA: Diagnosis not present

## 2021-06-01 ENCOUNTER — Ambulatory Visit: Payer: Medicare Other | Admitting: Physician Assistant

## 2021-06-01 DIAGNOSIS — E785 Hyperlipidemia, unspecified: Secondary | ICD-10-CM

## 2021-06-01 DIAGNOSIS — R55 Syncope and collapse: Secondary | ICD-10-CM

## 2021-06-01 DIAGNOSIS — I10 Essential (primary) hypertension: Secondary | ICD-10-CM

## 2021-06-01 DIAGNOSIS — I2510 Atherosclerotic heart disease of native coronary artery without angina pectoris: Secondary | ICD-10-CM

## 2021-06-01 DIAGNOSIS — Z8673 Personal history of transient ischemic attack (TIA), and cerebral infarction without residual deficits: Secondary | ICD-10-CM

## 2021-06-02 ENCOUNTER — Encounter: Payer: Self-pay | Admitting: Adult Health

## 2021-06-02 ENCOUNTER — Ambulatory Visit (INDEPENDENT_AMBULATORY_CARE_PROVIDER_SITE_OTHER): Payer: Medicare Other | Admitting: Adult Health

## 2021-06-02 ENCOUNTER — Encounter: Payer: Self-pay | Admitting: Physician Assistant

## 2021-06-02 ENCOUNTER — Ambulatory Visit (INDEPENDENT_AMBULATORY_CARE_PROVIDER_SITE_OTHER): Payer: Medicare Other | Admitting: Physician Assistant

## 2021-06-02 VITALS — BP 102/59 | HR 86 | Ht 68.0 in | Wt 157.2 lb

## 2021-06-02 VITALS — BP 90/42 | HR 68 | Ht 67.0 in | Wt 157.4 lb

## 2021-06-02 DIAGNOSIS — I959 Hypotension, unspecified: Secondary | ICD-10-CM

## 2021-06-02 DIAGNOSIS — R55 Syncope and collapse: Secondary | ICD-10-CM

## 2021-06-02 DIAGNOSIS — E7800 Pure hypercholesterolemia, unspecified: Secondary | ICD-10-CM | POA: Diagnosis not present

## 2021-06-02 DIAGNOSIS — I25118 Atherosclerotic heart disease of native coronary artery with other forms of angina pectoris: Secondary | ICD-10-CM | POA: Diagnosis not present

## 2021-06-02 DIAGNOSIS — N186 End stage renal disease: Secondary | ICD-10-CM

## 2021-06-02 DIAGNOSIS — I63412 Cerebral infarction due to embolism of left middle cerebral artery: Secondary | ICD-10-CM | POA: Diagnosis not present

## 2021-06-02 DIAGNOSIS — Z992 Dependence on renal dialysis: Secondary | ICD-10-CM

## 2021-06-02 DIAGNOSIS — I2510 Atherosclerotic heart disease of native coronary artery without angina pectoris: Secondary | ICD-10-CM

## 2021-06-02 DIAGNOSIS — E1122 Type 2 diabetes mellitus with diabetic chronic kidney disease: Secondary | ICD-10-CM | POA: Diagnosis not present

## 2021-06-02 DIAGNOSIS — R0689 Other abnormalities of breathing: Secondary | ICD-10-CM

## 2021-06-02 DIAGNOSIS — Z794 Long term (current) use of insulin: Secondary | ICD-10-CM | POA: Diagnosis not present

## 2021-06-02 NOTE — Assessment & Plan Note (Signed)
He is on chronic O2. ?

## 2021-06-02 NOTE — Progress Notes (Signed)
?Cardiology Office Note:   ? ?Date:  06/02/2021  ? ?ID:  Nicholas [name], DOB 09-11-1930, MRN [id number] ? ?PCP:  Windy Fast, MD  ?[facility] Providers ?Cardiologist:  Sinclair Grooms, MD ?Electrophysiologist:  Virl Axe, MD     ?Referring MD: Windy Fast, MD  ? ?Chief Complaint:  Hospitalization Follow-up (Unstable angina in the setting of lower GI bleeding) ?  ? ?Patient Profile: ?ESRD; dialysis TThSa ?Recurrent syncope ?S/p ILR ?Eval by EP - AV block w narrow QRS (no pacer needed) ?Coronary artery disease  ?NSTEMI 06/2018 s/p orbital atherectomy, DES to LAD ?D1 80 (jailed by stent); OM1 100; pLCx 80  ?Chronic angina ?Cath 05/2020:  dLM 40-60; pLAD stent patent w 30 ISR, D1 jailed 100 w collats; mLAD 50-70; pLCx 80; OM 100 (L-L collats); RCA 100 (R-R and L-R collats)>> Med Rx  ?HFimpEF (heart failure with improved ejection fraction)  ?Ischemic CM; EF 35-40 >> improved to normal ?Echocardiogram 03/2021: EF 60-65 ?Diverticulosis s/p prior GI bleed ?Chronic Obstructive Pulmonary Disease ?O2 dependent ?Diabetes mellitus  ?Hypertension  ?Hyperlipidemia  ?NSVT  ?Anemia of chronic disease  ?Ascending thoracic aortic aneurysm ?CT 07/2020: 4.1 cm  ?Aortic atherosclerosis  ?Recurrent R pleural effusion ? ?Prior CV Studies: ?LEFT HEART CATH AND CORONARY ANGIOGRAPHY 06/05/2020 ?dLM 40-60 ?pLAD stent patent w 30 ISR, D1 jailed 100 w collats; mLAD 50-70 ?pLCx 80; OM 100 (L-L collats) ?RCA 100 (R-R and L-R collats)>> Med Rx  ? ? ?  ?CARDIAC TELEMETRY MONITORING-INTERPRETATION ONLY 03/20/2019 ?Narrative ?? Normal sinus rhythm ?? PACs and PVCs are noted. ?? Nonsustained VT at rates less than 140 and duration 5 beats was noted. ?? No atrial fibrillation. ?? Brief ectopic atrial tachycardia. ? ?Overall, PACs, PVCs, nonsustained SVT, but no malignant arrhythmias noted.  No pauses are noted. ?  ?ECHO COMPLETE WO IMAGING ENHANCING AGENT 04/16/2021 ?EF 60-65, no RWMA, GR 1 DD, normal RVSF, mildly elevated PASP, mild LAE, trivial  MR, AV sclerosis without stenosis ?   ? ?History of Present Illness:   ?Nicholas [name] is a 88 y.o. male with the above problem list.  He was last seen by Dr. Tamala Julian in August 2022.  He was also last seen by Dr. Caryl Comes in January 2023.  He has been admitted to the hospital 3 times since March.  He was initially admitted in March with a left MCA territory CVA that was thought to be related to hypotension.  His isosorbide was discontinued and he was placed on midodrine for hypotension.  He was discharged on aspirin and Plavix.  He was readmitted in April with lower GI bleeding likely related to diverticulosis.  He received transfusion with PRBCs.  His antiplatelet regimen was held briefly.  He was readmitted 4/22-4/26 with recurrent lower GI bleeding.  He had a near syncopal event prior to admission that was felt to be vasovagal.  He required transfusion of PRBCs again.  He was seen by cardiology for chest pain and elevated troponins.  His chest pain was relieved by nitroglycerin.  His elevated troponins were fairly flat.  His antiplatelet therapy was held.  He was discharged on Plavix but not aspirin. ? ?He returns for follow-up.  He is here today with his wife.  He continues to have occasional episodes of chest discomfort.  There has been no increase in the pattern.  He takes nitroglycerin as needed with relief.  He has not had further syncope.  He is on chronic O2.  He typically sleeps sitting up.  He  has been able to get to his dry weight at dialysis.  He does not have significant lower extremity edema. ?   ?Past Medical History:  ?Diagnosis Date  ? Anemia   ? Arthritis   ? AV block   ? CAD (coronary artery disease)   ? Chronic kidney disease   ? Chronic respiratory failure ([place])   ? COPD (chronic obstructive pulmonary disease) ([place])   ? Diabetes mellitus ([place])   ? Dialysis patient [facility])   ? End stage renal disease ([place])   ? GERD (gastroesophageal reflux disease)   ? occ  ? GI bleeding   ? Heart murmur   ? History of  loop recorder   ? Hypercholesteremia   ? Hypertension   ? Psoriasis   ? Sinus bradycardia   ? Syncope   ? ?Current Medications: ?Current Meds  ?Medication Sig  ? acetaminophen (TYLENOL) 500 MG tablet Take 500 mg by mouth 3 (three) times daily as needed for mild pain, moderate pain or headache.  ? albuterol (VENTOLIN HFA) 108 (90 Base) MCG/ACT inhaler Inhale 2 puffs into the lungs 4 (four) times daily as needed for shortness of breath.  ? atorvastatin (LIPITOR) 80 MG tablet Take 80 mg by mouth at bedtime.  ? cinacalcet (SENSIPAR) 30 MG tablet Take 30 mg by mouth See admin instructions. Take 30 mg by mouth prior to dialysis on Tues/Thurs/Sat  ? clopidogrel (PLAVIX) 75 MG tablet Take 1 tablet (75 mg total) by mouth daily with breakfast. Restart after 5 days  ? diphenhydrAMINE (BENADRYL) 25 mg capsule Take 25 mg by mouth daily as needed for itching.  ? Ensure Plus (ENSURE PLUS) LIQD Take 237 mLs by mouth 2 (two) times daily between meals. Chocolate  ? insulin NPH-regular Human (70-30) 100 UNIT/ML injection Inject 10 Units into the skin daily with breakfast. Takes 20 units in the morning if blood sugar is over 140  ? midodrine (PROAMATINE) 5 MG tablet Take 1 tablet (5 mg total) by mouth 3 (three) times daily with meals.  ? nitroGLYCERIN (NITROSTAT) 0.4 MG SL tablet Place 1 tablet (0.4 mg total) under the tongue every 5 (five) minutes x 3 doses as needed for chest pain.  ? psyllium (HYDROCIL/METAMUCIL) 95 % PACK Take 1 packet by mouth daily.  ?  ?Allergies:   Hydrochlorothiazide and Isosorbide dinitrate  ? ?Social History  ? ?Tobacco Use  ? Smoking status: Former  ?  Packs/day: 1.00  ?  Years: 15.00  ?  Pack years: 15.00  ?  Types: Cigarettes  ?  Quit date: 03/29/1987  ?  Years since quitting: 34.2  ? Smokeless tobacco: Never  ?Substance Use Topics  ? Alcohol use: No  ? Drug use: No  ?  ?Family Hx: ?The patient's family history includes Diabetes in his brother, mother, and sister; Stroke in his father and mother. ? ?Review  of Systems  ?Gastrointestinal:  Negative for hematochezia and melena.   ? ?EKGs/Labs/Other Test Reviewed:   ? ?EKG:  EKG is not ordered today.  The ekg ordered today demonstrates n/a ? ?Recent Labs: ?06/04/2020: B Natriuretic Peptide 268.2 ?09/09/2020: TSH 4.302 ?09/10/2020: Magnesium 2.0 ?05/15/2021: ALT 12 ?05/18/2021: BUN 30; Creatinine, Ser 5.61; Potassium 3.8; Sodium 135 ?05/19/2021: Hemoglobin 7.1; Platelets 285  ? ?Recent Lipid Panel ?Recent Labs  ?  04/16/21 ?0210  ?CHOL 198  ?TRIG 87  ?HDL 65  ?VLDL 17  ?LDLCALC 116*  ?  ? ?Risk Assessment/Calculations:   ?  ?    ?Physical Exam:   ? ?  VS:  BP (!) 90/42   Pulse 68   Ht '5\' 7"'$  (1.702 m)   Wt 157 lb 6.4 oz (71.4 kg)   BMI 24.65 kg/m?    ? ?Wt Readings from Last 3 Encounters:  ?06/02/21 157 lb 6.4 oz (71.4 kg)  ?06/02/21 157 lb 3.2 oz (71.3 kg)  ?05/18/21 151 lb 7.3 oz (68.7 kg)  ?  ?Constitutional:   ?   Appearance: Not in distress. Chronically ill-appearing.  ?Neck:  ?   Vascular: No JVR.  ?Pulmonary:  ?   Effort: Pulmonary effort is normal.  ?   Breath sounds: No wheezing. No rales.  ?Cardiovascular:  ?   Normal rate. Regular rhythm. Normal S1. Normal S2.   ?   Murmurs: There is a grade 1/6 systolic murmur at the URSB.  ?Edema: ?   Peripheral edema absent.  ?Abdominal:  ?   Palpations: Abdomen is soft.  ?Musculoskeletal:  ?   Cervical back: Neck supple. Skin: ?   General: Skin is warm and dry.  ?Neurological:  ?   General: No focal deficit present.  ?   Mental Status: Alert and oriented to person, place and time.  ?   Cranial Nerves: Cranial nerves are intact.  ?   ?    ?ASSESSMENT & PLAN:   ?Coronary artery disease ?History of non-STEMI in 2020 treated with orbital atherectomy and DES to the LAD.  He has significant residual disease elsewhere.  Most recent cardiac catheterization in 2022 demonstrated a patent stent in the LAD.  He has severe disease in the LCx and occluded OM and occluded RCA.  He is managed medically.  He was recently admitted with a stroke  that was felt to be related to hypotension.  He was taken off of isosorbide at that time.  He had recurring admissions for lower GI bleeding.  His most recent admission was complicated by worsening angina.  H

## 2021-06-02 NOTE — Patient Instructions (Signed)
Continue plavix 75 mg daily  ?BP goal 130-150  ?Cholesterol LDL goal 70 ?HgbA1c goal <6.5 %- on insulin ?Maintain healthy diet and exercise ?If your symptoms worsen or you develop new symptoms please let us know.  ? ?

## 2021-06-02 NOTE — Assessment & Plan Note (Signed)
History of non-STEMI in 2020 treated with orbital atherectomy and DES to the LAD.  He has significant residual disease elsewhere.  Most recent cardiac catheterization in 2022 demonstrated a patent stent in the LAD.  He has severe disease in the LCx and occluded OM and occluded RCA.  He is managed medically.  He was recently admitted with a stroke that was felt to be related to hypotension.  He was taken off of isosorbide at that time.  He had recurring admissions for lower GI bleeding.  His most recent admission was complicated by worsening angina.  He had elevated enzymes without a clear trend.  It appears that this was probably most likely related to demand ischemia.  He has occasional anginal symptoms and takes nitroglycerin as needed.  His blood pressure is quite low today.  I do not think she would be a candidate for ranolazine as he is on dialysis.  Continue current therapy with Plavix 75 mg daily, Lipitor 80 mg daily and nitroglycerin as needed.  Follow-up with Dr. Tamala Julian in 6 months. ?

## 2021-06-02 NOTE — Patient Instructions (Signed)
Medication Instructions:  ?Your physician recommends that you continue on your current medications as directed. Please refer to the Current Medication list given to you today. ? ?*If you need a refill on your cardiac medications before your next appointment, please call your pharmacy* ? ? ?Lab Work: ?None ordered ? ?If you have labs (blood work) drawn today and your tests are completely normal, you will receive your results only by: ?MyChart Message (if you have MyChart) OR ?A paper copy in the mail ?If you have any lab test that is abnormal or we need to change your treatment, we will call you to review the results. ? ? ?Testing/Procedures: ?None ordered ? ? ?Follow-Up: ?At [facility], you and your health needs are our priority.  As part of our continuing mission to provide you with exceptional heart care, we have created designated Provider Care Teams.  These Care Teams include your primary Cardiologist (physician) and Advanced Practice Providers (APPs -  Physician Assistants and Nurse Practitioners) who all work together to provide you with the care you need, when you need it. ? ?We recommend signing up for the patient portal called "MyChart".  Sign up information is provided on this After Visit Summary.  MyChart is used to connect with patients for Virtual Visits (Telemedicine).  Patients are able to view lab/test results, encounter notes, upcoming appointments, etc.  Non-urgent messages can be sent to your provider as well.   ?To learn more about what you can do with MyChart, go to [email or web address].   ? ?Your next appointment:   ?6 month(s) ? ?The format for your next appointment:   ?In Person ? ?Provider:   ?Sinclair Grooms, MD   ? ? ?Other Instructions ? ? ?Important Information About Sugar ? ? ? ? ?  ?

## 2021-06-02 NOTE — Assessment & Plan Note (Signed)
Continue Lipitor 80 mg daily.

## 2021-06-02 NOTE — Assessment & Plan Note (Signed)
History of ILR.  He has not had recurrent syncope. ?

## 2021-06-02 NOTE — Progress Notes (Signed)
? ? ?PATIENT: Nicholas [name] ?DOB: Jun 19, 1930 ? ?REASON FOR VISIT: follow up ?HISTORY FROM: patient ?PRIMARY NEUROLOGIST: Dr. Leonie Man ? ?Chief Complaint  ?Patient presents with  ? Follow-up  ?  Pt in 5  with wife  pt is currently on oxygen. Pt states he is currently taking PT/OT. Pt states he is here to follow up after his stroke   ? ? ? ?HISTORY OF PRESENT ILLNESS: ?Today 06/02/21 ?  History Copied for Dr. Erlinda Hong note ?Mr. Nicholas [name] is a 88 y.o. male with history of DM2, HTN, HLD, ESRD (HD TTS), CAD s/p stent, 2L O2 requirement at home, who presented to the ED on 3/21 with an episode of confusion, slow speech and movements overall and lethargy, without clear lateralization of weakness, he was also hypoglycemic and felt that his symptoms improved dramatically with glucose administration. Per notes he successfully completed dialysis and appeared back to neurological baseline.  Head CT was read as negative, and he was discharged.  However he represented due to recurrent symptoms and MRI demonstrates a sizable left parietal stroke.   ?  ?Stroke: Left MCA parietal infarcts likely due to Left M2 stenosis in the setting of hypotension ?Code Stroke CT head: Not obtained given CTH on 04/13/2021, which was negative for acute abnormalities. ?MRI: Moderate-sized early subacute infarct in the left MCA distribution, suspected due to a calcified embolus within the sylvian fissure. Curvilinear SWI signal dropout within the infarct territory is consistent with petechial hemorrhage. There is mild edema with sulcal effacement but no midline shift. Additional punctate infarcts in the left caudate body and superior frontal gyrus ?MRA: Diminished flow in a left MCA branch correlating with the known acute infarct. Suspected nonocclusive filling defect in a more proximal left M2 branch, likely sequela of embolism. ?2D Echo: Pending ?Loop recorder in place, interrogation showed no evidence of afib ?LE venous duplex no DVT ?LDL 116   (04/16/2021) ?HgbA1c 7.6 (04/16/2021)  ?Plavix 75 mg daily prior to admission, now on aspirin 81 mg daily and clopidogrel 75 mg daily  for 3 months and then Plavix alone given severe intracranial stenosis ? ?Today- Here with is wife. On Midodrine to help with low BP. Wife reports that his SBP is never about 130. Currently just on plavix. Reports that he was told to stop ASA due to history of diverticulitis. Reports that he has trouble still with writing. Sometimes he has a hard time knowing how to write a word he is thinking. No additional stroke-like symptoms. Did complete therapy. ? ?REVIEW OF SYSTEMS: Out of a complete 14 system review of symptoms, the patient complains only of the following symptoms, and all other reviewed systems are negative. ? ?ALLERGIES: ?Allergies  ?Allergen Reactions  ? Hydrochlorothiazide Nausea And Vomiting  ? Isosorbide Dinitrate Other (See Comments)  ?  Lowered the B/P too far  ? ? ?HOME MEDICATIONS: ?Outpatient Medications Prior to Visit  ?Medication Sig Dispense Refill  ? acetaminophen (TYLENOL) 500 MG tablet Take 500 mg by mouth 3 (three) times daily as needed for mild pain, moderate pain or headache.    ? albuterol (VENTOLIN HFA) 108 (90 Base) MCG/ACT inhaler Inhale 2 puffs into the lungs 4 (four) times daily as needed for shortness of breath.    ? atorvastatin (LIPITOR) 80 MG tablet Take 80 mg by mouth at bedtime.    ? cinacalcet (SENSIPAR) 30 MG tablet Take 30 mg by mouth See admin instructions. Take 30 mg by mouth prior to dialysis on Tues/Thurs/Sat    ?  clopidogrel (PLAVIX) 75 MG tablet Take 1 tablet (75 mg total) by mouth daily with breakfast. Restart after 5 days (Patient taking differently: Take 75 mg by mouth daily with breakfast.) 30 tablet 3  ? diphenhydrAMINE (BENADRYL) 25 mg capsule Take 25 mg by mouth daily as needed for itching.    ? Ensure Plus (ENSURE PLUS) LIQD Take 237 mLs by mouth 2 (two) times daily between meals. Chocolate    ? insulin NPH-regular Human (70-30)  100 UNIT/ML injection Inject 10 Units into the skin daily with breakfast. Takes 20 units in the morning if blood sugar is over 140 (Patient taking differently: Inject 20 Units into the skin See admin instructions. Inject 20 units into the skin in the morning if blood sugar is over 140) 1 mL 0  ? midodrine (PROAMATINE) 5 MG tablet Take 1 tablet (5 mg total) by mouth 3 (three) times daily with meals. 90 tablet 1  ? mometasone (ASMANEX, 60 METERED DOSES,) 220 MCG/ACT inhaler Inhale 2 puffs into the lungs at bedtime as needed (for flares).    ? nitroGLYCERIN (NITROSTAT) 0.4 MG SL tablet Place 1 tablet (0.4 mg total) under the tongue every 5 (five) minutes x 3 doses as needed for chest pain. 30 tablet 1  ? psyllium (HYDROCIL/METAMUCIL) 95 % PACK Take 1 packet by mouth daily. 240 each 0  ? HYDROcodone-acetaminophen (NORCO/VICODIN) 5-325 MG tablet Take 1 tablet by mouth every 12 (twelve) hours as needed for moderate pain.    ? ?No facility-administered medications prior to visit.  ? ? ?PAST MEDICAL HISTORY: ?Past Medical History:  ?Diagnosis Date  ? Anemia   ? Arthritis   ? AV block   ? CAD (coronary artery disease)   ? Chronic kidney disease   ? Chronic respiratory failure ([place])   ? COPD (chronic obstructive pulmonary disease) ([place])   ? Diabetes mellitus ([place])   ? Dialysis patient [facility])   ? End stage renal disease ([place])   ? GERD (gastroesophageal reflux disease)   ? occ  ? GI bleeding   ? Heart murmur   ? History of loop recorder   ? Hypercholesteremia   ? Hypertension   ? Psoriasis   ? Sinus bradycardia   ? Syncope   ? ? ?PAST SURGICAL HISTORY: ?Past Surgical History:  ?Procedure Laterality Date  ? CARDIAC CATHETERIZATION  09  ? "some blockage" with collaterals by cath at [facility] ~ 2009; no intervention required; reportedly, no routine cardiology f/u recommended   ? COLONOSCOPY WITH PROPOFOL N/A 07/06/2020  ? Procedure: COLONOSCOPY WITH PROPOFOL;  Surgeon: Ronald Lobo, MD;  Location: [place];  Service: Endoscopy;   Laterality: N/A;  ? CORONARY ATHERECTOMY N/A 08/09/2018  ? Procedure: CORONARY ATHERECTOMY;  Surgeon: Jettie Booze, MD;  Location: [place] CV LAB;  Service: Cardiovascular;  Laterality: N/A;  ? CORONARY STENT INTERVENTION N/A 08/09/2018  ? Procedure: CORONARY STENT INTERVENTION;  Surgeon: Jettie Booze, MD;  Location: [place] CV LAB;  Service: Cardiovascular;  Laterality: N/A;  ? ESOPHAGOGASTRODUODENOSCOPY (EGD) WITH PROPOFOL N/A 07/06/2020  ? Procedure: ESOPHAGOGASTRODUODENOSCOPY (EGD) WITH PROPOFOL;  Surgeon: Ronald Lobo, MD;  Location: [place];  Service: Endoscopy;  Laterality: N/A;  ? INGUINAL HERNIA REPAIR Right 04/13/2012  ? Procedure: HERNIA REPAIR INGUINAL INCARCERATED;  Surgeon: Gayland Curry, MD;  Location: [place];  Service: General;  Laterality: Right;  ? INSERTION OF MESH Right 04/13/2012  ? Procedure: INSERTION OF MESH;  Surgeon: Gayland Curry, MD;  Location: [place];  Service: General;  Laterality: Right;  ? IR THORACENTESIS ASP PLEURAL SPACE W/IMG GUIDE  06/08/2020  ? LEFT HEART CATH N/A 08/09/2018  ? Procedure: Left Heart Cath;  Surgeon: Jettie Booze, MD;  Location: [place] CV LAB;  Service: Cardiovascular;  Laterality: N/A;  ? LEFT HEART CATH AND CORONARY ANGIOGRAPHY N/A 08/08/2018  ? Procedure: LEFT HEART CATH AND CORONARY ANGIOGRAPHY;  Surgeon: Troy Sine, MD;  Location: [place] CV LAB;  Service: Cardiovascular;  Laterality: N/A;  ? LEFT HEART CATH AND CORONARY ANGIOGRAPHY N/A 06/05/2020  ? Procedure: LEFT HEART CATH AND CORONARY ANGIOGRAPHY;  Surgeon: Belva Crome, MD;  Location: [place] CV LAB;  Service: Cardiovascular;  Laterality: N/A;  ? VASECTOMY    ? ? ?FAMILY HISTORY: ?Family History  ?Problem Relation Age of Onset  ? Diabetes Mother   ? Stroke Mother   ? Stroke Father   ? Diabetes Sister   ? Diabetes Brother   ? ? ?SOCIAL HISTORY: ?Social History  ? ?Socioeconomic History  ? Marital status: Married  ?  Spouse name: Not on file  ? Number of  children: Not on file  ? Years of education: Not on file  ? Highest education level: Not on file  ?Occupational History  ? Not on file  ?Tobacco Use  ? Smoking status: Former  ?  Packs/day: 1.00  ?  Years: 15.00  ?  Pa

## 2021-06-02 NOTE — Assessment & Plan Note (Signed)
He remains on dialysis every Tuesday, Thursday, Saturday. ?

## 2021-06-03 NOTE — Progress Notes (Signed)
I agree with the above plan 

## 2021-06-04 ENCOUNTER — Other Ambulatory Visit: Payer: Self-pay | Admitting: *Deleted

## 2021-06-04 ENCOUNTER — Ambulatory Visit: Payer: Medicare Other | Admitting: *Deleted

## 2021-06-04 NOTE — Patient Outreach (Signed)
[facility]) Care Management ? ?06/04/2021 ? ?[name] ?14-Nov-1930 ?[id number] ? ? ?Outgoing call placed to member/wife, unsuccessful.  HIPAA compliant voice message left, will follow up within the next 2-3 business days. ? ?Valente David, RN, MSN, CCM ?[facility] Care Management  ?Community Care Manager ?[phone] ? ?

## 2021-06-07 ENCOUNTER — Other Ambulatory Visit: Payer: Self-pay | Admitting: *Deleted

## 2021-06-07 DIAGNOSIS — I69398 Other sequelae of cerebral infarction: Secondary | ICD-10-CM | POA: Diagnosis not present

## 2021-06-07 DIAGNOSIS — R29898 Other symptoms and signs involving the musculoskeletal system: Secondary | ICD-10-CM | POA: Diagnosis not present

## 2021-06-07 DIAGNOSIS — E1122 Type 2 diabetes mellitus with diabetic chronic kidney disease: Secondary | ICD-10-CM | POA: Diagnosis not present

## 2021-06-07 DIAGNOSIS — I1311 Hypertensive heart and chronic kidney disease without heart failure, with stage 5 chronic kidney disease, or end stage renal disease: Secondary | ICD-10-CM | POA: Diagnosis not present

## 2021-06-07 DIAGNOSIS — N186 End stage renal disease: Secondary | ICD-10-CM | POA: Diagnosis not present

## 2021-06-07 DIAGNOSIS — I69328 Other speech and language deficits following cerebral infarction: Secondary | ICD-10-CM | POA: Diagnosis not present

## 2021-06-07 NOTE — Patient Outreach (Signed)
?[facility]) Care Management ?Telephonic RN Care Manager Note ? ? ?06/07/2021 ?Name:  Nicholas [name] MRN:  [id number] DOB:  06-17-1930 ? ?Summary: ?Outreach attempt #2, successful.  Denies any urgent concerns, encouraged to contact this care manager with questions.   ? ? ?Subjective: ?Nicholas [name] is an 88 y.o. year old male who is a primary patient of Windy Fast, MD. The care management team was consulted for assistance with care management and/or care coordination needs.   ? ?Telephonic RN Care Manager completed Telephone Visit today. ? ?Objective:  ? ?Medications Reviewed Today   ? ? Reviewed by Sharmon Revere (Physician Assistant) on 06/02/21 at 1501  Med List Status: <None>  ? ?Medication Order Taking? Sig Documenting Provider Last Dose Status Informant  ?acetaminophen (TYLENOL) 500 MG tablet [id number] Yes Take 500 mg by mouth 3 (three) times daily as needed for mild pain, moderate pain or headache. [provider] Taking Active Self  ?albuterol (VENTOLIN HFA) 108 (90 Base) MCG/ACT inhaler [id number] Yes Inhale 2 puffs into the lungs 4 (four) times daily as needed for shortness of breath. [provider] Taking Active Multiple Informants  ?         ?Med Note Briant Cedar   Wed Jun 02, 2021  2:05 PM)    ?atorvastatin (LIPITOR) 80 MG tablet [id number] Yes Take 80 mg by mouth at bedtime. [provider] Taking Active Self  ?cinacalcet (SENSIPAR) 30 MG tablet [id number] Yes Take 30 mg by mouth See admin instructions. Take 30 mg by mouth prior to dialysis on Tues/Thurs/Sat [provider] Taking Active Self  ?clopidogrel (PLAVIX) 75 MG tablet [id number] Yes Take 1 tablet (75 mg total) by mouth daily with breakfast. Restart after 5 days Domenic Polite, MD Taking Active Self  ?diphenhydrAMINE (BENADRYL) 25 mg capsule [id number] Yes Take 25 mg by mouth daily as needed for itching. [provider] Taking Active Self  ?Ensure Plus (ENSURE PLUS) LIQD  [id number] Yes Take 237 mLs by mouth 2 (two) times daily between meals. Chocolate [provider] Taking Active Self  ?insulin NPH-regular Human (70-30) 100 UNIT/ML injection [id number] Yes Inject 10 Units into the skin daily with breakfast. Takes 20 units in the morning if blood sugar is over 140 Domenic Polite, MD Taking Active Self  ?midodrine (PROAMATINE) 5 MG tablet [id number] Yes Take 1 tablet (5 mg total) by mouth 3 (three) times daily with meals. [name], MD Taking Active Self  ?         ?Med Note Briant Cedar   Wed Jun 02, 2021  2:05 PM)    ?nitroGLYCERIN (NITROSTAT) 0.4 MG SL tablet [id number] Yes Place 1 tablet (0.4 mg total) under the tongue every 5 (five) minutes x 3 doses as needed for chest pain. [name], MD Taking Active Self  ?psyllium (HYDROCIL/METAMUCIL) 95 % PACK [id number] Yes Take 1 packet by mouth daily. Domenic Polite, MD Taking Active Self  ?Med List Note Payton Doughty, CPhT 08/06/18 2010): Dialysis Tuesday, Thursday, Saturday - [place]  ? ?  ?  ? ?  ? ? ? ?SDOH:  (Social Determinants of Health) assessments and interventions performed:  ? ? ? ?Care Plan ? ?Review of patient past medical history, allergies, medications, health status, including review of consultants reports, laboratory and other test data, was performed as part of comprehensive evaluation for care management services.  ? ?Care Plan : [facility] Plan of Care (Adult)  ?Updates made by Valente David, RN since 06/07/2021 12:00 AM  ?  ? ?  Problem: Care Coordination and knowledge needs post hospital discharge for stroke   ?Priority: High  ?  ? ?Long-Range Goal: Member and family will adequately manage chronic conditions and decrease risk for recurrent stroke   ?Start Date: 04/23/2021  ?Expected End Date: 10/20/2021  ?This Visit's Progress: On track  ?Recent Progress: On track  ?Priority: High  ?Note:   ?Current Barriers:  ?Chronic Disease Management support and education needs related to HLD and  Stroke  ? ?RNCM Clinical Goal(s):  ?Patient will verbalize understanding of plan for management of HLD and Stroke as evidenced by Member/family verbalizing adequate plan of care ?take all medications exactly as prescribed and will call provider for medication related questions as evidenced by Member/family reporting adherence ?attend all scheduled medical appointments: PCP and Neurology as evidenced by Member/family report attendance ?continue to work with RN Care Manager to address care management and care coordination needs related to  HLD and Stroke as evidenced by adherence to CM Team Scheduled appointments ?work with home health to attend therapy sessions with PT/OT as evidenced by increased strength and indpendence  through collaboration with Consulting civil engineer, provider, and care team.  ? ?Interventions: ?Inter-disciplinary care team collaboration (see longitudinal plan of care) ?Evaluation of current treatment plan related to  self management and patient's adherence to plan as established by provider ? ? ?Hyperlipidemia Interventions:  (Status:  Goal on track:  Yes.) Long Term Goal ?Medication review performed; medication list updated in electronic medical record.  ?Provider established cholesterol goals reviewed ?Provided HLD educational materials ?Reviewed importance of limiting foods high in cholesterol ? ?Stroke:  (Status:Goal on track:  Yes.) Long Term Goal ?Reviewed Importance of taking all medications as prescribed ?Advised to report any changes in symptoms or exercise tolerance ?Assessed for signs and symptoms of stroke ?Reviewed referrals to home health ?Reviewed the importance of exercise ?Assessed for cognitive impairment ?Assessed for fall status and safety in the home ? ?Patient Goals/Self-Care Activities: ?call for medicine refill 2 or 3 days before it runs out ?take all medications exactly as prescribed ?call doctor with any symptoms you believe are related to your medicine ? ?Follow Up Plan:   The patient has been provided with contact information for the care management team and has been advised to call with any health related questions or concerns.  ? ? ?Update 4/14 - Spoke with wife, she report member is improving.  He has been gaining strength and using his right side more.  Walking is more stable with walker and he is able to write more legibly now.  He remains active with home health PT/OT through [facility].  Was seen by PCP on 4/7 at the [place], has also seen neurology at the [facility] since last outreach on 3/31.  Blood pressure has remained stable with Midodrine, unsure what it was today as home health assessed it earlier (wasn't told what it was but that it was "good.")  He has not experienced any signs/symptoms of recurrent stroke, denies other complaints. ? ? ?Update 5/1 - Member admitted to hospital 4/19-4/21 for GI bleed (DAPT and irritation of diverticulosis) and again 4/22-4/26 for vasovagal syncope related to bleed while on dialysis.  He continued to have bleeding once he got home and passed out at HD the next day.  Anticoagulation therapy was not initially changed after discharge, ASA has been since stopped.  Today member state he has not had any further issues with bleeding, tolerating dialysis treatments without trouble.  Aware that he should follow up  with both primary and GI, state he spoke to primary since discharge and they are working on getting follow up appointment scheduled.  Wife will call GI office to schedule visit.  Advised that he has appointments scheduled for 5/9 with cardiology and 5/10 with neurology.  Report he has already followed up with neurology at the [facility], wife will call [place] neurology to notify them and inquire if there is a need to keep appointment.  She will also call to reschedule cardiology appointment as he has dialysis on 5/9.  Member was to have [facility] restarted for home health PT however they have not heard from them.  Call placed, confirm [facility] has received  referral to restart services and will call member directly to schedule home visit. ? ? ?Update 5/15 - Wife report member has been doing much better, denies any further bleeding.  Confirms that [facility] has restar

## 2021-06-09 DIAGNOSIS — R29898 Other symptoms and signs involving the musculoskeletal system: Secondary | ICD-10-CM | POA: Diagnosis not present

## 2021-06-09 DIAGNOSIS — N186 End stage renal disease: Secondary | ICD-10-CM | POA: Diagnosis not present

## 2021-06-09 DIAGNOSIS — E1122 Type 2 diabetes mellitus with diabetic chronic kidney disease: Secondary | ICD-10-CM | POA: Diagnosis not present

## 2021-06-09 DIAGNOSIS — I69398 Other sequelae of cerebral infarction: Secondary | ICD-10-CM | POA: Diagnosis not present

## 2021-06-09 DIAGNOSIS — I69328 Other speech and language deficits following cerebral infarction: Secondary | ICD-10-CM | POA: Diagnosis not present

## 2021-06-09 DIAGNOSIS — I1311 Hypertensive heart and chronic kidney disease without heart failure, with stage 5 chronic kidney disease, or end stage renal disease: Secondary | ICD-10-CM | POA: Diagnosis not present

## 2021-06-14 ENCOUNTER — Ambulatory Visit: Payer: Medicare Other | Admitting: *Deleted

## 2021-06-16 DIAGNOSIS — R29898 Other symptoms and signs involving the musculoskeletal system: Secondary | ICD-10-CM | POA: Diagnosis not present

## 2021-06-16 DIAGNOSIS — N186 End stage renal disease: Secondary | ICD-10-CM | POA: Diagnosis not present

## 2021-06-16 DIAGNOSIS — E1122 Type 2 diabetes mellitus with diabetic chronic kidney disease: Secondary | ICD-10-CM | POA: Diagnosis not present

## 2021-06-16 DIAGNOSIS — E1159 Type 2 diabetes mellitus with other circulatory complications: Secondary | ICD-10-CM | POA: Diagnosis not present

## 2021-06-16 DIAGNOSIS — I69328 Other speech and language deficits following cerebral infarction: Secondary | ICD-10-CM | POA: Diagnosis not present

## 2021-06-16 DIAGNOSIS — I69398 Other sequelae of cerebral infarction: Secondary | ICD-10-CM | POA: Diagnosis not present

## 2021-06-16 DIAGNOSIS — B351 Tinea unguium: Secondary | ICD-10-CM | POA: Diagnosis not present

## 2021-06-16 DIAGNOSIS — L84 Corns and callosities: Secondary | ICD-10-CM | POA: Diagnosis not present

## 2021-06-16 DIAGNOSIS — I1311 Hypertensive heart and chronic kidney disease without heart failure, with stage 5 chronic kidney disease, or end stage renal disease: Secondary | ICD-10-CM | POA: Diagnosis not present

## 2021-06-16 NOTE — Progress Notes (Signed)
[facility] Summary Report / Loop Recorder 

## 2021-06-30 DIAGNOSIS — D649 Anemia, unspecified: Secondary | ICD-10-CM | POA: Diagnosis not present

## 2021-06-30 DIAGNOSIS — Z8719 Personal history of other diseases of the digestive system: Secondary | ICD-10-CM | POA: Diagnosis not present

## 2021-07-05 ENCOUNTER — Other Ambulatory Visit: Payer: Self-pay | Admitting: *Deleted

## 2021-07-05 ENCOUNTER — Ambulatory Visit (INDEPENDENT_AMBULATORY_CARE_PROVIDER_SITE_OTHER): Payer: Medicare Other

## 2021-07-05 DIAGNOSIS — I442 Atrioventricular block, complete: Secondary | ICD-10-CM

## 2021-07-05 NOTE — Patient Outreach (Signed)
[facility]) Care Management Telephonic RN Care Manager Note   07/05/2021 Name:  Nicholas [name] MRN:  [id number] DOB:  1930/04/24  Summary: Nicholas [name] call placed to member, successful to wife.  Denies any urgent concerns, encouraged to contact this care manager with questions.     Subjective: Nicholas [name] is an 88 y.o. year old male who is a primary patient of Nicholas Fast, MD. The care management team was consulted for assistance with care management and/or care coordination needs.    Telephonic RN Care Manager completed Telephone Visit today.  Objective:   Medications Reviewed Today     Reviewed by Sharmon Revere (Physician Assistant) on 06/02/21 at 1501  Med List Status: <None>   Medication Order Taking? Sig Documenting Provider Last Dose Status Informant  acetaminophen (TYLENOL) 500 MG tablet [id number] Yes Take 500 mg by mouth 3 (three) times daily as needed for mild pain, moderate pain or headache. [provider] Taking Active Self  albuterol (VENTOLIN HFA) 108 (90 Base) MCG/ACT inhaler [id number] Yes Inhale 2 puffs into the lungs 4 (four) times daily as needed for shortness of breath. [provider] Taking Active Multiple Informants           Med Note Olena Heckle, MICHELE   Wed Jun 02, 2021  2:05 PM)    atorvastatin (LIPITOR) 80 MG tablet [id number] Yes Take 80 mg by mouth at bedtime. [provider] Taking Active Self  cinacalcet (SENSIPAR) 30 MG tablet [id number] Yes Take 30 mg by mouth See admin instructions. Take 30 mg by mouth prior to dialysis on Tues/Thurs/Sat [provider] Taking Active Self  clopidogrel (PLAVIX) 75 MG tablet [id number] Yes Take 1 tablet (75 mg total) by mouth daily with breakfast. Restart after 5 days Domenic Polite, MD Taking Active Self  diphenhydrAMINE (BENADRYL) 25 mg capsule [id number] Yes Take 25 mg by mouth daily as needed for itching. [provider] Taking Active Self  Ensure Plus  (ENSURE PLUS) LIQD [id number] Yes Take 237 mLs by mouth 2 (two) times daily between meals. Chocolate [provider] Taking Active Self  insulin NPH-regular Human (70-30) 100 UNIT/ML injection [id number] Yes Inject 10 Units into the skin daily with breakfast. Takes 20 units in the morning if blood sugar is over 140 Domenic Polite, MD Taking Active Self  midodrine (PROAMATINE) 5 MG tablet [id number] Yes Take 1 tablet (5 mg total) by mouth 3 (three) times daily with meals. [name], MD Taking Active Self           Med Note Olena Heckle, MICHELE   Wed Jun 02, 2021  2:05 PM)    nitroGLYCERIN (NITROSTAT) 0.4 MG SL tablet [id number] Yes Place 1 tablet (0.4 mg total) under the tongue every 5 (five) minutes x 3 doses as needed for chest pain. [name], MD Taking Active Self  psyllium (HYDROCIL/METAMUCIL) 95 % PACK [id number] Yes Take 1 packet by mouth daily. Domenic Polite, MD Taking Active Self  Med List Note Payton Doughty, CPhT 08/06/18 2010): Dialysis Tuesday, Thursday, Saturday - [place] [facility]             SDOH:  (Social Determinants of Health) assessments and interventions performed:     Care Plan  Review of patient past medical history, allergies, medications, health status, including review of consultants reports, laboratory and other test data, was performed as part of comprehensive evaluation for care management services.   Care Plan : RNCM Plan of Care (Adult)  Updates made by Valente David, RN  since 07/05/2021 12:00 AM     Problem: Care Coordination and knowledge needs post hospital discharge for stroke   Priority: High     Long-Range Goal: Member and family will adequately manage chronic conditions and decrease risk for recurrent stroke   Start Date: 04/23/2021  Expected End Date: 10/20/2021  This Visit's Progress: On track  Recent Progress: On track  Priority: High  Note:   Current Barriers:  Chronic Disease Management support and education needs  related to HLD and Stroke   RNCM Clinical Goal(s):  Patient will verbalize understanding of plan for management of HLD and Stroke as evidenced by Member/family verbalizing adequate plan of care take all medications exactly as prescribed and will call provider for medication related questions as evidenced by Member/family reporting adherence attend all scheduled medical appointments: PCP and Neurology as evidenced by Member/family report attendance continue to work with RN Care Manager to address care management and care coordination needs related to  HLD and Stroke as evidenced by adherence to CM Team Scheduled appointments through collaboration with RN Care manager, provider, and care team.   Interventions: Inter-disciplinary care team collaboration (see longitudinal plan of care) Evaluation of current treatment plan related to  self management and patient's adherence to plan as established by provider   Hyperlipidemia Interventions:  (Status:  Goal on track:  Yes.) Long Term Goal Medication review performed; medication list updated in electronic medical record.  Provider established cholesterol goals reviewed Provided HLD educational materials Reviewed importance of limiting foods high in cholesterol  Stroke:  (Status:Goal on track:  Yes.) Long Term Goal Reviewed Importance of taking all medications as prescribed Advised to report any changes in symptoms or exercise tolerance Assessed for signs and symptoms of stroke Reviewed referrals to home health Reviewed the importance of exercise Assessed for cognitive impairment Assessed for fall status and safety in the home  Patient Goals/Self-Care Activities: call for medicine refill 2 or 3 days before it runs out take all medications exactly as prescribed call doctor with any symptoms you believe are related to your medicine  Follow Up Plan:  The patient has been provided with contact information for the care management team and has been  advised to call with any health related questions or concerns.    Update 4/14 - Spoke with wife, she report member is improving.  He has been gaining strength and using his right side more.  Walking is more stable with walker and he is able to write more legibly now.  He remains active with home health PT/OT through [place] [facility].  Was seen by PCP on 4/7 at the [place], has also seen neurology at the [facility] since last outreach on 3/31.  Blood pressure has remained stable with Midodrine, unsure what it was today as home health assessed it earlier (wasn't told what it was but that it was "good.")  He has not experienced any signs/symptoms of recurrent stroke, denies other complaints.   Update 5/1 - Member admitted to hospital 4/19-4/21 for GI bleed (DAPT and irritation of diverticulosis) and again 4/22-4/26 for vasovagal syncope related to bleed while on dialysis.  He continued to have bleeding once he got home and passed out at HD the next day.  Anticoagulation therapy was not initially changed after discharge, ASA has been since stopped.  Today member state he has not had any further issues with bleeding, tolerating dialysis treatments without trouble.  Aware that he should follow up with both primary and GI, state he spoke to primary  since discharge and they are working on getting follow up appointment scheduled.  Wife will call GI office to schedule visit.  Advised that he has appointments scheduled for 5/9 with cardiology and 5/10 with neurology.  Report he has already followed up with neurology at the [facility], wife will call [place] neurology to notify them and inquire if there is a need to keep appointment.  She will also call to reschedule cardiology appointment as he has dialysis on 5/9.  Member was to have [facility] restarted for home health PT however they have not heard from them.  Call placed, confirm [facility] has received referral to restart services and will call member directly to schedule home visit.   Update 5/15 -  Wife report member has been doing much better, denies any further bleeding.  Confirms that [facility] has restarted services for PT.  Completed appointments with cardiology on 5/9 and with neurology on 5/10, will see GI in the next couple weeks.     Update 6/12 - Spoke to wife, state member is "doing really good."  Home health services has completed, state member's mobility is much better.  Followed up with GI as well as PCP last week, no further bleeding.  Follow up with PCP again and neurology in the next 6 months.           Plan:  Telephone follow up appointment with care management team member scheduled for:  1 month The patient has been provided with contact information for the care management team and has been advised to call with any health related questions or concerns.   Valente David, RN, MSN, [place] Manager [phone]

## 2021-07-06 LAB — CUP PACEART REMOTE DEVICE CHECK
Date Time Interrogation Session: 20230605231541
Implantable Pulse Generator Implant Date: 20210412

## 2021-07-22 NOTE — Progress Notes (Signed)
[facility] Summary Report / Loop Recorder 

## 2021-08-02 ENCOUNTER — Ambulatory Visit: Payer: Medicare Other | Admitting: *Deleted

## 2021-08-05 LAB — CUP PACEART REMOTE DEVICE CHECK
Date Time Interrogation Session: 20230708230626
Implantable Pulse Generator Implant Date: 20210412

## 2021-08-06 ENCOUNTER — Other Ambulatory Visit: Payer: Self-pay | Admitting: *Deleted

## 2021-08-06 NOTE — Patient Outreach (Signed)
  Care Coordination   Follow Up Visit Note   08/06/2021 Name: Nicholas [name] MRN: [id number] DOB: April 21, 1930  Keneth Borg is a 88 y.o. year old male who sees Windy Fast, MD for primary care. I spoke with  [name] by phone today  What matters to the patients health and wellness today?  Continue HD and remain active with follow up appointments.   SDOH assessments and interventions completed:   No   Care Coordination Interventions Activated:  No Care Coordination Interventions:   No, not indicated  Follow up plan: No further intervention required.  Denies any urgent concerns, encouraged to contact this care manager with questions.    Encounter Outcome:  Pt. Visit Completed   Valente David, RN, MSN, [place] Manager [phone]

## 2021-08-09 ENCOUNTER — Ambulatory Visit (INDEPENDENT_AMBULATORY_CARE_PROVIDER_SITE_OTHER): Payer: Medicare Other

## 2021-08-09 DIAGNOSIS — R55 Syncope and collapse: Secondary | ICD-10-CM

## 2021-08-18 DIAGNOSIS — L84 Corns and callosities: Secondary | ICD-10-CM | POA: Diagnosis not present

## 2021-08-18 DIAGNOSIS — B351 Tinea unguium: Secondary | ICD-10-CM | POA: Diagnosis not present

## 2021-08-18 DIAGNOSIS — E1159 Type 2 diabetes mellitus with other circulatory complications: Secondary | ICD-10-CM | POA: Diagnosis not present

## 2021-09-09 NOTE — Progress Notes (Signed)
[facility] Summary Report / Loop Recorder 

## 2021-09-13 ENCOUNTER — Ambulatory Visit (INDEPENDENT_AMBULATORY_CARE_PROVIDER_SITE_OTHER): Payer: Medicare Other

## 2021-09-13 DIAGNOSIS — I442 Atrioventricular block, complete: Secondary | ICD-10-CM

## 2021-09-14 LAB — CUP PACEART REMOTE DEVICE CHECK
Date Time Interrogation Session: 20230820231210
Implantable Pulse Generator Implant Date: 20210412

## 2021-09-17 DIAGNOSIS — J9611 Chronic respiratory failure with hypoxia: Secondary | ICD-10-CM | POA: Diagnosis not present

## 2021-09-17 DIAGNOSIS — J90 Pleural effusion, not elsewhere classified: Secondary | ICD-10-CM | POA: Diagnosis not present

## 2021-10-10 NOTE — Progress Notes (Signed)
[facility] Summary Report / Loop Recorder 

## 2021-10-18 ENCOUNTER — Ambulatory Visit (INDEPENDENT_AMBULATORY_CARE_PROVIDER_SITE_OTHER): Payer: Medicare Other

## 2021-10-18 DIAGNOSIS — I442 Atrioventricular block, complete: Secondary | ICD-10-CM

## 2021-10-19 LAB — CUP PACEART REMOTE DEVICE CHECK
Date Time Interrogation Session: 20230922230356
Implantable Pulse Generator Implant Date: 20210412

## 2021-10-24 ENCOUNTER — Emergency Department (HOSPITAL_COMMUNITY): Payer: Medicare Other

## 2021-10-24 ENCOUNTER — Encounter (HOSPITAL_COMMUNITY): Payer: Self-pay | Admitting: Emergency Medicine

## 2021-10-24 ENCOUNTER — Emergency Department (HOSPITAL_COMMUNITY)
Admission: EM | Admit: 2021-10-24 | Discharge: 2021-10-24 | Disposition: A | Discharge status: Home / Self Care | Payer: Medicare Other | Attending: Emergency Medicine | Admitting: Emergency Medicine

## 2021-10-24 DIAGNOSIS — R55 Syncope and collapse: Secondary | ICD-10-CM

## 2021-10-24 DIAGNOSIS — Z992 Dependence on renal dialysis: Secondary | ICD-10-CM | POA: Diagnosis not present

## 2021-10-24 DIAGNOSIS — Z79899 Other long term (current) drug therapy: Secondary | ICD-10-CM | POA: Insufficient documentation

## 2021-10-24 DIAGNOSIS — J9811 Atelectasis: Secondary | ICD-10-CM | POA: Diagnosis not present

## 2021-10-24 DIAGNOSIS — Z7902 Long term (current) use of antithrombotics/antiplatelets: Secondary | ICD-10-CM | POA: Insufficient documentation

## 2021-10-24 DIAGNOSIS — R918 Other nonspecific abnormal finding of lung field: Secondary | ICD-10-CM | POA: Diagnosis not present

## 2021-10-24 DIAGNOSIS — R059 Cough, unspecified: Secondary | ICD-10-CM | POA: Diagnosis not present

## 2021-10-24 DIAGNOSIS — N186 End stage renal disease: Secondary | ICD-10-CM | POA: Diagnosis not present

## 2021-10-24 DIAGNOSIS — Z794 Long term (current) use of insulin: Secondary | ICD-10-CM | POA: Insufficient documentation

## 2021-10-24 DIAGNOSIS — R739 Hyperglycemia, unspecified: Secondary | ICD-10-CM | POA: Diagnosis not present

## 2021-10-24 DIAGNOSIS — R42 Dizziness and giddiness: Secondary | ICD-10-CM | POA: Diagnosis present

## 2021-10-24 DIAGNOSIS — J90 Pleural effusion, not elsewhere classified: Secondary | ICD-10-CM | POA: Diagnosis not present

## 2021-10-24 DIAGNOSIS — I959 Hypotension, unspecified: Secondary | ICD-10-CM | POA: Diagnosis not present

## 2021-10-24 LAB — CBC WITH DIFFERENTIAL/PLATELET
Abs Immature Granulocytes: 0.03 10*3/uL (ref 0.00–0.07)
Basophils Absolute: 0 10*3/uL (ref 0.0–0.1)
Basophils Relative: 0 %
Eosinophils Absolute: 0.1 10*3/uL (ref 0.0–0.5)
Eosinophils Relative: 1 %
HCT: 26.1 % — ABNORMAL LOW (ref 39.0–52.0)
Hemoglobin: 7.9 g/dL — ABNORMAL LOW (ref 13.0–17.0)
Immature Granulocytes: 0 %
Lymphocytes Relative: 8 %
Lymphs Abs: 0.6 10*3/uL — ABNORMAL LOW (ref 0.7–4.0)
MCH: 29.4 pg (ref 26.0–34.0)
MCHC: 30.3 g/dL (ref 30.0–36.0)
MCV: 97 fL (ref 80.0–100.0)
Monocytes Absolute: 0.8 10*3/uL (ref 0.1–1.0)
Monocytes Relative: 10 %
Neutro Abs: 6.6 10*3/uL (ref 1.7–7.7)
Neutrophils Relative %: 81 %
Platelets: 304 10*3/uL (ref 150–400)
RBC: 2.69 MIL/uL — ABNORMAL LOW (ref 4.22–5.81)
RDW: 14 % (ref 11.5–15.5)
WBC: 8.2 10*3/uL (ref 4.0–10.5)
nRBC: 0 % (ref 0.0–0.2)

## 2021-10-24 LAB — TROPONIN I (HIGH SENSITIVITY)
Troponin I (High Sensitivity): 15 ng/L (ref <18)
Troponin I (High Sensitivity): 15 ng/L (ref <18)

## 2021-10-24 LAB — COMPREHENSIVE METABOLIC PANEL
ALT: 13 U/L (ref 0–44)
AST: 16 U/L (ref 15–41)
Albumin: 3 g/dL — ABNORMAL LOW (ref 3.5–5.0)
Alkaline Phosphatase: 87 U/L (ref 38–126)
Anion gap: 17 — ABNORMAL HIGH (ref 5–15)
BUN: 25 mg/dL — ABNORMAL HIGH (ref 8–23)
CO2: 25 mmol/L (ref 22–32)
Calcium: 8.4 mg/dL — ABNORMAL LOW (ref 8.9–10.3)
Chloride: 93 mmol/L — ABNORMAL LOW (ref 98–111)
Creatinine, Ser: 6.24 mg/dL — ABNORMAL HIGH (ref 0.61–1.24)
GFR, Estimated: 8 mL/min — ABNORMAL LOW (ref >60)
Glucose, Bld: 349 mg/dL — ABNORMAL HIGH (ref 70–99)
Potassium: 3.7 mmol/L (ref 3.5–5.1)
Sodium: 135 mmol/L (ref 135–145)
Total Bilirubin: 0.3 mg/dL (ref 0.3–1.2)
Total Protein: 7.2 g/dL (ref 6.5–8.1)

## 2021-10-24 LAB — MAGNESIUM: Magnesium: 2 mg/dL (ref 1.7–2.4)

## 2021-10-24 NOTE — ED Provider Triage Note (Signed)
Emergency Medicine Provider Triage Evaluation Note  Nathanyel Defenbaugh , a 88 y.o. male  was evaluated in triage.  Pt complains of passing out while at church.  He states he does not recall the episode.  He denies any prodromal symptoms including lightheadedness, diaphoresis, chest pain.  He denies any symptoms at this time.  He is alert and oriented and at his baseline.  He does have history of CAD status post PCI.  Wears supplemental O2 at baseline.  Review of Systems  Positive: As above Negative: As above  Physical Exam  BP (!) 111/56 (BP Location: Right Arm)   Pulse 97   Temp 98.4 F (36.9 C) (Oral)   Resp 16   SpO2 100%  Gen:   Awake, no distress   Resp:  Normal effort  MSK:   Moves extremities without difficulty  Other:    Medical Decision Making  Medically screening exam initiated at 12:51 PM.  Appropriate orders placed.  Erle Guster was informed that the remainder of the evaluation will be completed by another provider, this initial triage assessment does not replace that evaluation, and the importance of remaining in the ED until their evaluation is complete.     Evlyn Courier, PA-C 10/24/21 1252

## 2021-10-24 NOTE — Discharge Instructions (Signed)
As we discussed, your syncope is likely from mild dehydration and being in a crowded space  Please stay hydrated.  Please go to dialysis as scheduled on Tuesday  See your doctor for follow-up   Return to ER if you have another episode of passing out, chest pain, shortness of breath, headaches

## 2021-10-24 NOTE — ED Notes (Signed)
Pt provided bag lunch and ginger ale for PO challenge

## 2021-10-24 NOTE — ED Triage Notes (Signed)
Patient BIB [facility] from church for evaluation of a sudden loss of consciousness lasting 2-3 minutes while sitting a pew, did not fall out of seat. Patient has no complaints now, is alert, oriented, and in no apparent distress at this time.

## 2021-10-24 NOTE — ED Provider Notes (Signed)
[facility] EMERGENCY DEPARTMENT Provider Note   CSN: [id number] Arrival date & time: 10/24/21  1239     History  Chief Complaint  Patient presents with   Loss of Consciousness    Nicholas [name] is a 88 y.o. male history of ESRD on dialysis (last HD yesterday), CAD status post stent, here presenting with loss of consciousness.  Patient was at church earlier today.  He states that he did not eat much food today.  He states that he felt lightheaded and dizzy and just passed out.  Cannot quite remember what happened there.  Family was with him and lowered him to the floor and he did not have any head injury.  He did have an episode of nosebleed this morning.  Patient is on 2 L nasal cannula at baseline.  Patient has some nonproductive cough.  The history is provided by the patient.       Home Medications Prior to Admission medications   Medication Sig Start Date End Date Taking? Authorizing Provider  acetaminophen (TYLENOL) 500 MG tablet Take 500 mg by mouth 3 (three) times daily as needed for mild pain, moderate pain or headache.    [provider]  albuterol (VENTOLIN HFA) 108 (90 Base) MCG/ACT inhaler Inhale 2 puffs into the lungs 4 (four) times daily as needed for shortness of breath.    [provider]  atorvastatin (LIPITOR) 80 MG tablet Take 80 mg by mouth at bedtime.    [provider]  cinacalcet (SENSIPAR) 30 MG tablet Take 30 mg by mouth See admin instructions. Take 30 mg by mouth prior to dialysis on Tues/Thurs/Sat 09/18/20   [provider]  clopidogrel (PLAVIX) 75 MG tablet Take 1 tablet (75 mg total) by mouth daily with breakfast. Restart after 5 days 09/18/20   Domenic Polite, MD  diphenhydrAMINE (BENADRYL) 25 mg capsule Take 25 mg by mouth daily as needed for itching. 04/06/21   [provider]  Ensure Plus (ENSURE PLUS) LIQD Take 237 mLs by mouth 2 (two) times daily between meals. Chocolate    [provider]  insulin NPH-regular Human (70-30) 100 UNIT/ML injection Inject 10 Units into the skin daily with breakfast. Takes 20 units in the morning if blood sugar is over 140 09/13/20   Domenic Polite, MD  midodrine (PROAMATINE) 5 MG tablet Take 1 tablet (5 mg total) by mouth 3 (three) times daily with meals. 04/17/21   [name], MD  nitroGLYCERIN (NITROSTAT) 0.4 MG SL tablet Place 1 tablet (0.4 mg total) under the tongue every 5 (five) minutes x 3 doses as needed for chest pain. 08/10/18   [name], MD  psyllium (HYDROCIL/METAMUCIL) 95 % PACK Take 1 packet by mouth daily. 05/14/21   Domenic Polite, MD      Allergies    Hydrochlorothiazide and Isosorbide dinitrate    Review of Systems   Review of Systems  Respiratory:  Positive for cough.   All other systems reviewed and are negative.   Physical Exam Updated Vital Signs BP 121/73   Pulse 87   Temp 98.1 F (36.7 C) (Oral)   Resp 18   SpO2 100%  Physical Exam Vitals and nursing note reviewed.  Constitutional:      Comments: Chronically ill but not acutely ill  HENT:     Head: Normocephalic.     Nose: Nose normal.     Mouth/Throat:     Mouth: Mucous membranes are moist.  Eyes:  Extraocular Movements: Extraocular movements intact.     Pupils: Pupils are equal, round, and reactive to light.  Cardiovascular:     Rate and Rhythm: Normal rate and regular rhythm.     Pulses: Normal pulses.     Heart sounds: Normal heart sounds.  Pulmonary:     Comments: Diminished bilateral bases Abdominal:     General: Abdomen is flat.     Palpations: Abdomen is soft.  Musculoskeletal:        General: Normal range of motion.     Cervical back: Normal range of motion and neck supple.  Skin:    General: Skin is warm.     Capillary Refill: Capillary refill takes less than 2 seconds.  Neurological:     General: No focal deficit present.     Mental Status: He is alert.  Psychiatric:        Mood and Affect: Mood  normal.        Behavior: Behavior normal.     ED Results / Procedures / Treatments   Labs (all labs ordered are listed, but only abnormal results are displayed) Labs Reviewed  CBC WITH DIFFERENTIAL/PLATELET - Abnormal; Notable for the following components:      Result Value   RBC 2.69 (*)    Hemoglobin 7.9 (*)    HCT 26.1 (*)    Lymphs Abs 0.6 (*)    All other components within normal limits  COMPREHENSIVE METABOLIC PANEL - Abnormal; Notable for the following components:   Chloride 93 (*)    Glucose, Bld 349 (*)    BUN 25 (*)    Creatinine, Ser 6.24 (*)    Calcium 8.4 (*)    Albumin 3.0 (*)    GFR, Estimated 8 (*)    Anion gap 17 (*)    All other components within normal limits  MAGNESIUM  TROPONIN I (HIGH SENSITIVITY)  TROPONIN I (HIGH SENSITIVITY)    EKG EKG Interpretation  Date/Time:  Sunday October 24 2021 12:53:34 EDT Ventricular Rate:  99 PR Interval:  178 QRS Duration: 90 QT Interval:  358 QTC Calculation: 459 R Axis:   -26 Text Interpretation: Normal sinus rhythm Septal infarct , age undetermined Possible Lateral infarct , age undetermined Abnormal ECG When compared with ECG of 16-May-2021 21:35, PREVIOUS ECG IS PRESENT Confirmed by Wandra Arthurs ([id number]) on 10/24/2021 5:26:54 PM  Radiology DG Chest 2 View  Result Date: 10/24/2021 CLINICAL DATA:  Syncope. EXAM: CHEST - 2 VIEW COMPARISON:  Chest radiograph 05/15/2021 FINDINGS: Stable mildly enlarged cardiac silhouette. Mildly tortuous thoracic aorta with calcific atherosclerosis. Loop recorder overlies the left chest wall. Unchanged right basilar opacity and layering pleural fluid. Calcified granuloma in the right lung base. Left lung is clear. No pneumothorax. Multilevel degenerative changes in the thoracic spine. IMPRESSION: Stable exam with unchanged right basilar atelectasis and small layering right pleural effusion. Aortic Atherosclerosis (ICD10-I70.0). Electronically Signed   By: Ileana Roup M.D.   On:  10/24/2021 13:44    Procedures Procedures    Medications Ordered in ED Medications - No data to display  ED Course/ Medical Decision Making/ A&P                           Medical Decision Making [name] is a 88 y.o. male here presenting with near syncope at church.  Patient is dialysis patient and last dialysis was yesterday.  Patient passed out at church today.  Patient has no signs  of head injury.  Patient appears chronically ill.  Patient has no chest pain.  I reviewed his labs and troponin negative x2.  Patient's creatinine is baseline.  Chest x-ray showed unchanged atelectasis.  Offered admission given patient has history of CAD with stent but patient wants to go home.  Told him to stay hydrated and return if he has another episode of syncope or chest pain.   Problems Addressed: Syncope, unspecified syncope type: acute illness or injury  Amount and/or Complexity of Data Reviewed Labs: ordered. Decision-making details documented in ED Course. Radiology: ordered and independent interpretation performed. Decision-making details documented in ED Course. ECG/medicine tests: ordered and independent interpretation performed. Decision-making details documented in ED Course.    Final Clinical Impression(s) / ED Diagnoses Final diagnoses:  None    Rx / DC Orders ED Discharge Orders     None         Drenda Freeze, MD 10/24/21 [phone]

## 2021-10-29 ENCOUNTER — Telehealth: Payer: Self-pay

## 2021-10-29 NOTE — Telephone Encounter (Signed)
   Reason for call: ED-Follow up Visit  Patient  visit on 10/24/2021  at [facility] was for Fainting  Have you been able to follow up with your primary care physician? - Appt Scheduled  The patient was or was not able to obtain any needed medicine or equipment. - Yes  Are there diet recommendations that you are having difficulty following? - No  Patient expresses understanding of discharge instructions and education provided has no other needs at this time.    [facility]  [place], [place]  Main Phone: [phone]  E-mail: [email or web address]  Website: [email or web address]

## 2021-11-01 DIAGNOSIS — B351 Tinea unguium: Secondary | ICD-10-CM | POA: Diagnosis not present

## 2021-11-01 DIAGNOSIS — L84 Corns and callosities: Secondary | ICD-10-CM | POA: Diagnosis not present

## 2021-11-01 DIAGNOSIS — E1159 Type 2 diabetes mellitus with other circulatory complications: Secondary | ICD-10-CM | POA: Diagnosis not present

## 2021-11-01 NOTE — Progress Notes (Signed)
[facility] Summary Report / Loop Recorder 

## 2021-11-22 ENCOUNTER — Ambulatory Visit: Payer: Medicare Other | Attending: Internal Medicine

## 2021-11-22 DIAGNOSIS — I442 Atrioventricular block, complete: Secondary | ICD-10-CM | POA: Insufficient documentation

## 2021-11-22 LAB — CUP PACEART REMOTE DEVICE CHECK
Date Time Interrogation Session: 20231025231016
Implantable Pulse Generator Implant Date: 20210412

## 2021-12-01 ENCOUNTER — Other Ambulatory Visit: Payer: Self-pay

## 2021-12-01 ENCOUNTER — Encounter (HOSPITAL_BASED_OUTPATIENT_CLINIC_OR_DEPARTMENT_OTHER): Payer: Self-pay | Admitting: Emergency Medicine

## 2021-12-01 DIAGNOSIS — J449 Chronic obstructive pulmonary disease, unspecified: Secondary | ICD-10-CM | POA: Diagnosis not present

## 2021-12-01 DIAGNOSIS — I2510 Atherosclerotic heart disease of native coronary artery without angina pectoris: Secondary | ICD-10-CM | POA: Insufficient documentation

## 2021-12-01 DIAGNOSIS — E1122 Type 2 diabetes mellitus with diabetic chronic kidney disease: Secondary | ICD-10-CM | POA: Insufficient documentation

## 2021-12-01 DIAGNOSIS — Z79899 Other long term (current) drug therapy: Secondary | ICD-10-CM | POA: Insufficient documentation

## 2021-12-01 DIAGNOSIS — Z7951 Long term (current) use of inhaled steroids: Secondary | ICD-10-CM | POA: Diagnosis not present

## 2021-12-01 DIAGNOSIS — Z7902 Long term (current) use of antithrombotics/antiplatelets: Secondary | ICD-10-CM | POA: Insufficient documentation

## 2021-12-01 DIAGNOSIS — M79632 Pain in left forearm: Secondary | ICD-10-CM | POA: Insufficient documentation

## 2021-12-01 DIAGNOSIS — Z87891 Personal history of nicotine dependence: Secondary | ICD-10-CM | POA: Diagnosis not present

## 2021-12-01 DIAGNOSIS — N186 End stage renal disease: Secondary | ICD-10-CM | POA: Diagnosis not present

## 2021-12-01 DIAGNOSIS — Z794 Long term (current) use of insulin: Secondary | ICD-10-CM | POA: Diagnosis not present

## 2021-12-01 DIAGNOSIS — I120 Hypertensive chronic kidney disease with stage 5 chronic kidney disease or end stage renal disease: Secondary | ICD-10-CM | POA: Diagnosis not present

## 2021-12-01 DIAGNOSIS — E119 Type 2 diabetes mellitus without complications: Secondary | ICD-10-CM | POA: Diagnosis not present

## 2021-12-01 DIAGNOSIS — Z992 Dependence on renal dialysis: Secondary | ICD-10-CM | POA: Insufficient documentation

## 2021-12-01 NOTE — ED Triage Notes (Signed)
Patient c/o left forearm pain below dialysis shunt since this morning.  Patient goes to dialysis Tues/Thurs/Sat, has not missed dialysis.

## 2021-12-02 ENCOUNTER — Emergency Department (HOSPITAL_BASED_OUTPATIENT_CLINIC_OR_DEPARTMENT_OTHER)
Admission: EM | Admit: 2021-12-02 | Discharge: 2021-12-02 | Disposition: A | Discharge status: Home / Self Care | Payer: Medicare Other | Attending: Emergency Medicine | Admitting: Emergency Medicine

## 2021-12-02 DIAGNOSIS — M79632 Pain in left forearm: Secondary | ICD-10-CM | POA: Diagnosis not present

## 2021-12-02 NOTE — ED Provider Notes (Signed)
[facility] DEPT [facility]HP Provider Note: Georgena Spurling, MD, FACEP  CSN: [id number] MRN: [id number] ARRIVAL: 12/01/21 at [place]: [place]  Arm Pain   HISTORY OF PRESENT ILLNESS  12/02/21 1:50 AM Nicholas [name] is a 88 y.o. male with end-stage renal disease on hemodialysis (Tuesday/Thursday/Saturday).  He has a dialysis fistula in his left upper arm.  Beginning yesterday morning he has had intermittent, spasm-like pains of his left forearm distal to the fistula.  There is no associated swelling, erythema or warmth.  He is not currently having any discomfort there.  He is due for dialysis later today.   Past Medical History:  Diagnosis Date   Anemia    Arthritis    AV block    CAD (coronary artery disease)    Chronic kidney disease    Chronic respiratory failure (HCC)    COPD (chronic obstructive pulmonary disease) (HCC)    Diabetes mellitus ([place])    Dialysis patient ([place])    End stage renal disease ([place])    GERD (gastroesophageal reflux disease)    occ   GI bleeding    Heart murmur    History of loop recorder    Hypercholesteremia    Hypertension    Psoriasis    Sinus bradycardia    Syncope     Past Surgical History:  Procedure Laterality Date   CARDIAC CATHETERIZATION  09   "some blockage" with collaterals by cath at [facility] ~ 2009; no intervention required; reportedly, no routine cardiology f/u recommended    COLONOSCOPY WITH PROPOFOL N/A 07/06/2020   Procedure: COLONOSCOPY WITH PROPOFOL;  Surgeon: Ronald Lobo, MD;  Location: [facility] ENDOSCOPY;  Service: Endoscopy;  Laterality: N/A;   CORONARY ATHERECTOMY N/A 08/09/2018   Procedure: CORONARY ATHERECTOMY;  Surgeon: Jettie Booze, MD;  Location: [facility] CV LAB;  Service: Cardiovascular;  Laterality: N/A;   CORONARY STENT INTERVENTION N/A 08/09/2018   Procedure: CORONARY STENT INTERVENTION;  Surgeon: Jettie Booze, MD;  Location: [facility] CV LAB;  Service: Cardiovascular;  Laterality:  N/A;   ESOPHAGOGASTRODUODENOSCOPY (EGD) WITH PROPOFOL N/A 07/06/2020   Procedure: ESOPHAGOGASTRODUODENOSCOPY (EGD) WITH PROPOFOL;  Surgeon: Ronald Lobo, MD;  Location: [facility];  Service: Endoscopy;  Laterality: N/A;   INGUINAL HERNIA REPAIR Right 04/13/2012   Procedure: HERNIA REPAIR INGUINAL INCARCERATED;  Surgeon: Gayland Curry, MD;  Location: [facility];  Service: General;  Laterality: Right;   INSERTION OF MESH Right 04/13/2012   Procedure: INSERTION OF MESH;  Surgeon: Gayland Curry, MD;  Location: [facility];  Service: General;  Laterality: Right;   IR THORACENTESIS ASP PLEURAL SPACE W/IMG GUIDE  06/08/2020   LEFT HEART CATH N/A 08/09/2018   Procedure: Left Heart Cath;  Surgeon: Jettie Booze, MD;  Location: [facility] CV LAB;  Service: Cardiovascular;  Laterality: N/A;   LEFT HEART CATH AND CORONARY ANGIOGRAPHY N/A 08/08/2018   Procedure: LEFT HEART CATH AND CORONARY ANGIOGRAPHY;  Surgeon: Troy Sine, MD;  Location: [facility] CV LAB;  Service: Cardiovascular;  Laterality: N/A;   LEFT HEART CATH AND CORONARY ANGIOGRAPHY N/A 06/05/2020   Procedure: LEFT HEART CATH AND CORONARY ANGIOGRAPHY;  Surgeon: Belva Crome, MD;  Location: [facility] CV LAB;  Service: Cardiovascular;  Laterality: N/A;   VASECTOMY      Family History  Problem Relation Age of Onset   Diabetes Mother    Stroke Mother    Stroke Father    Diabetes Sister    Diabetes Brother  Social History   Tobacco Use   Smoking status: Former    Packs/day: 1.00    Years: 15.00    Total pack years: 15.00    Types: Cigarettes    Quit date: 03/29/1987    Years since quitting: 34.7   Smokeless tobacco: Never  Substance Use Topics   Alcohol use: No   Drug use: No    Prior to Admission medications   Medication Sig Start Date End Date Taking? Authorizing Provider  acetaminophen (TYLENOL) 500 MG tablet Take 500 mg by mouth 3 (three) times daily as needed for mild pain, moderate pain or headache.    [provider]  albuterol (VENTOLIN HFA) 108 (90 Base) MCG/ACT inhaler Inhale 2 puffs into the lungs 4 (four) times daily as needed for shortness of breath.    [provider]  atorvastatin (LIPITOR) 80 MG tablet Take 80 mg by mouth at bedtime.    [provider]  cinacalcet (SENSIPAR) 30 MG tablet Take 30 mg by mouth See admin instructions. Take 30 mg by mouth prior to dialysis on Tues/Thurs/Sat 09/18/20   [provider]  clopidogrel (PLAVIX) 75 MG tablet Take 1 tablet (75 mg total) by mouth daily with breakfast. Restart after 5 days 09/18/20   Domenic Polite, MD  diphenhydrAMINE (BENADRYL) 25 mg capsule Take 25 mg by mouth daily as needed for itching. 04/06/21   [provider]  Ensure Plus (ENSURE PLUS) LIQD Take 237 mLs by mouth 2 (two) times daily between meals. Chocolate    [provider]  insulin NPH-regular Human (70-30) 100 UNIT/ML injection Inject 10 Units into the skin daily with breakfast. Takes 20 units in the morning if blood sugar is over 140 09/13/20   Domenic Polite, MD  midodrine (PROAMATINE) 5 MG tablet Take 1 tablet (5 mg total) by mouth 3 (three) times daily with meals. 04/17/21   [name], MD  nitroGLYCERIN (NITROSTAT) 0.4 MG SL tablet Place 1 tablet (0.4 mg total) under the tongue every 5 (five) minutes x 3 doses as needed for chest pain. 08/10/18   [name], MD  psyllium (HYDROCIL/METAMUCIL) 95 % PACK Take 1 packet by mouth daily. 05/14/21   Domenic Polite, MD    Allergies Hydrochlorothiazide and Isosorbide dinitrate   REVIEW OF SYSTEMS  Negative except as noted here or in the History of Present Illness.   PHYSICAL EXAMINATION  Initial Vital Signs Blood pressure 115/61, pulse 90, temperature 97.9 F (36.6 C), temperature source Oral, resp. rate 20, height '5\' 7"'$  (1.702 m), weight 65.8 kg, SpO2 91 %.  Examination General: Well-developed, well-nourished male in no acute distress; appearance consistent with  age of record HENT: normocephalic; atraumatic Eyes: Normal appearance Neck: supple Heart: regular rate and rhythm Lungs: clear to auscultation bilaterally Abdomen: soft; nondistended; nontender; bowel sounds present Extremities: No acute deformity; dialysis fistula left upper arm with pulse and thrill; no tenderness, swelling, erythema or warmth of left forearm with no pain on flexion or extension of left elbow Neurologic: Awake, alert and oriented; motor function intact in all extremities and symmetric; no facial droop Skin: Warm and dry Psychiatric: Normal mood and affect   RESULTS  Summary of this visit's results, reviewed and interpreted by myself:   EKG Interpretation  Date/Time:  Thursday December 02 2021 02:19:11 EST Ventricular Rate:  93 PR Interval:  206 QRS Duration: 95 QT Interval:  372 QTC Calculation: 463 R Axis:   -25 Text Interpretation: Sinus rhythm Borderline left axis deviation Probable anteroseptal  infarct, old No significant change was found Confirmed by Shanon Rosser [phone]) on 12/02/2021 2:22:17 AM       Laboratory Studies: No results found for this or any previous visit (from the past 24 hour(s)). Imaging Studies: No results found.  ED COURSE and MDM  Nursing notes, initial and subsequent vitals signs, including pulse oximetry, reviewed and interpreted by myself.  Vitals:   12/01/21 2234 12/01/21 2236 12/02/21 0136  BP:  (!) 108/56 115/61  Pulse:  93 90  Resp:  20 20  Temp:  97.9 F (36.6 C) 97.9 F (36.6 C)  TempSrc:  Oral Oral  SpO2:  90% 91%  Weight: 65.8 kg    Height: '5\' 7"'$  (1.702 m)     Medications - No data to display  The patient has a normal exam apart from the presence of the dialysis fistula.  He is asymptomatic at the present time.  His EKG is showing no ischemic changes.  He was advised to follow-up at dialysis later today.  PROCEDURES  Procedures   ED DIAGNOSES     ICD-10-CM   1. Left forearm pain  M79.632           Shanon Rosser, MD 12/02/21 [phone]

## 2021-12-04 NOTE — Progress Notes (Deleted)
Cardiology Office Note:    Date:  12/04/2021   ID:  Nicholas [name], DOB 01/11/1931, MRN [id number]  PCP:  Windy Fast, MD  Cardiologist:  Sinclair Grooms, MD   Referring MD: Windy Fast, MD   No chief complaint on file.   History of Present Illness:    Nicholas [name] is a 88 y.o. male with a hx of CAD, chronic systolic CHF,  ESRD on HD TTS, IDT2DM, HTN, HLD, anemia of chronic disease requiring transfusion, Non-ST elevation MI June 2020 , LAD with orbital atherectomy and stenting,recurrent syncope with Loop revealing intermittent complete AVB but pacer not required.    ***  Past Medical History:  Diagnosis Date   Anemia    Arthritis    AV block    CAD (coronary artery disease)    Chronic kidney disease    Chronic respiratory failure (HCC)    COPD (chronic obstructive pulmonary disease) (HCC)    Diabetes mellitus ([place])    Dialysis patient ([place])    End stage renal disease ([place])    GERD (gastroesophageal reflux disease)    occ   GI bleeding    Heart murmur    History of loop recorder    Hypercholesteremia    Hypertension    Psoriasis    Sinus bradycardia    Syncope     Past Surgical History:  Procedure Laterality Date   CARDIAC CATHETERIZATION  09   "some blockage" with collaterals by cath at [facility] ~ 2009; no intervention required; reportedly, no routine cardiology f/u recommended    COLONOSCOPY WITH PROPOFOL N/A 07/06/2020   Procedure: COLONOSCOPY WITH PROPOFOL;  Surgeon: Ronald Lobo, MD;  Location: [facility] ENDOSCOPY;  Service: Endoscopy;  Laterality: N/A;   CORONARY ATHERECTOMY N/A 08/09/2018   Procedure: CORONARY ATHERECTOMY;  Surgeon: Jettie Booze, MD;  Location: [facility] CV LAB;  Service: Cardiovascular;  Laterality: N/A;   CORONARY STENT INTERVENTION N/A 08/09/2018   Procedure: CORONARY STENT INTERVENTION;  Surgeon: Jettie Booze, MD;  Location: [facility] CV LAB;  Service: Cardiovascular;  Laterality: N/A;   ESOPHAGOGASTRODUODENOSCOPY (EGD) WITH  PROPOFOL N/A 07/06/2020   Procedure: ESOPHAGOGASTRODUODENOSCOPY (EGD) WITH PROPOFOL;  Surgeon: Ronald Lobo, MD;  Location: [facility];  Service: Endoscopy;  Laterality: N/A;   INGUINAL HERNIA REPAIR Right 04/13/2012   Procedure: HERNIA REPAIR INGUINAL INCARCERATED;  Surgeon: Gayland Curry, MD;  Location: [facility];  Service: General;  Laterality: Right;   INSERTION OF MESH Right 04/13/2012   Procedure: INSERTION OF MESH;  Surgeon: Gayland Curry, MD;  Location: [facility];  Service: General;  Laterality: Right;   IR THORACENTESIS ASP PLEURAL SPACE W/IMG GUIDE  06/08/2020   LEFT HEART CATH N/A 08/09/2018   Procedure: Left Heart Cath;  Surgeon: Jettie Booze, MD;  Location: [facility] CV LAB;  Service: Cardiovascular;  Laterality: N/A;   LEFT HEART CATH AND CORONARY ANGIOGRAPHY N/A 08/08/2018   Procedure: LEFT HEART CATH AND CORONARY ANGIOGRAPHY;  Surgeon: Troy Sine, MD;  Location: [place] CV LAB;  Service: Cardiovascular;  Laterality: N/A;   LEFT HEART CATH AND CORONARY ANGIOGRAPHY N/A 06/05/2020   Procedure: LEFT HEART CATH AND CORONARY ANGIOGRAPHY;  Surgeon: Belva Crome, MD;  Location: [facility] [place] CV LAB;  Service: Cardiovascular;  Laterality: N/A;   VASECTOMY      Current Medications: No outpatient medications have been marked as taking for the 12/06/21 encounter (Appointment) with Belva Crome, MD.     Allergies:   Hydrochlorothiazide and Isosorbide dinitrate  Social History   Socioeconomic History   Marital status: Married    Spouse name: Not on file   Number of children: Not on file   Years of education: Not on file   Highest education level: Not on file  Occupational History   Not on file  Tobacco Use   Smoking status: Former    Packs/day: 1.00    Years: 15.00    Total pack years: 15.00    Types: Cigarettes    Quit date: 03/29/1987    Years since quitting: 34.7   Smokeless tobacco: Never  Substance and Sexual Activity   Alcohol use: No   Drug use: No    Sexual activity: Not on file  Other Topics Concern   Not on file  Social History Narrative   Not on file   Social Determinants of Health   Financial Resource Strain: Not on file  Food Insecurity: No Food Insecurity (04/23/2021)   Hunger Vital Sign    Worried About Running Out of Food in the Last Year: Never true    Ran Out of Food in the Last Year: Never true  Transportation Needs: No Transportation Needs (04/23/2021)   PRAPARE - Hydrologist (Medical): No    Lack of Transportation (Non-Medical): No  Physical Activity: Not on file  Stress: Not on file  Social Connections: Not on file     Family History: The patient's family history includes Diabetes in his brother, mother, and sister; Stroke in his father and mother.  ROS:   Please see the history of present illness.    *** All other systems reviewed and are negative.  EKGs/Labs/Other Studies Reviewed:    The following studies were reviewed today:  LEFT HEART CATH AND CORONARY ANGIOGRAPHY 06/05/2020 dLM 40-60 pLAD stent patent w 30 ISR, D1 jailed 100 w collats; mLAD 50-70 pLCx 80; OM 100 (L-L collats) RCA 100 (R-R and L-R collats)>> Med Rx     ECHO 04/16/2021: IMPRESSIONS     1. Left ventricular ejection fraction, by estimation, is 60 to 65%. The  left ventricle has normal function. The left ventricle has no regional  wall motion abnormalities. Left ventricular diastolic parameters are  consistent with Grade I diastolic  dysfunction (impaired relaxation).   2. Right ventricular systolic function is normal. The right ventricular  size is normal. There is mildly elevated pulmonary artery systolic  pressure.   3. Left atrial size was mildly dilated.   4. The mitral valve is grossly normal. Trivial mitral valve  regurgitation. No evidence of mitral stenosis.   5. The aortic valve has an indeterminant number of cusps. There is  moderate calcification of the aortic valve. There is mild  thickening of  the aortic valve. Aortic valve regurgitation is not visualized. Aortic  valve sclerosis is present, with no  evidence of aortic valve stenosis.   6. The inferior vena cava is normal in size with greater than 50%  respiratory variability, suggesting right atrial pressure of 3 mmHg.   Comparison(s): No significant change from prior study.   Conclusion(s)/Recommendation(s): Otherwise normal echocardiogram, with  minor abnormalities described in the report. No intracardiac source of  embolism detected on this transthoracic study. Consider a transesophageal  echocardiogram to exclude cardiac  source of embolism if clinically indicated.   EKG:  EKG ***  Recent Labs: 10/24/2021: ALT 13; BUN 25; Creatinine, Ser 6.24; Hemoglobin 7.9; Magnesium 2.0; Platelets 304; Potassium 3.7; Sodium 135  Recent Lipid Panel  Component Value Date/Time   CHOL 198 04/16/2021 0210   TRIG 87 04/16/2021 0210   HDL 65 04/16/2021 0210   CHOLHDL 3.0 04/16/2021 0210   VLDL 17 04/16/2021 0210   LDLCALC 116 (H) 04/16/2021 0210    Physical Exam:    VS:  There were no vitals taken for this visit.    Wt Readings from Last 3 Encounters:  12/01/21 145 lb (65.8 kg)  06/02/21 157 lb 6.4 oz (71.4 kg)  06/02/21 157 lb 3.2 oz (71.3 kg)     GEN: ***. No acute distress HEENT: Normal NECK: No JVD. LYMPHATICS: No lymphadenopathy CARDIAC: *** murmur. RRR *** gallop, or edema. VASCULAR: *** Normal Pulses. No bruits. RESPIRATORY:  Clear to auscultation without rales, wheezing or rhonchi  ABDOMEN: Soft, non-tender, non-distended, No pulsatile mass, MUSCULOSKELETAL: No deformity  SKIN: Warm and dry NEUROLOGIC:  Alert and oriented x 3 PSYCHIATRIC:  Normal affect   ASSESSMENT:    1. Coronary artery disease involving native coronary artery of native heart with other form of angina pectoris ([place])   2. Complete heart block ([place])   3. Hypercholesteremia   4. ESRD on dialysis ([place])   5. Chronic systolic  CHF (congestive heart failure) (HCC)    PLAN:    In order of problems listed above:  ***   Medication Adjustments/Labs and Tests Ordered: Current medicines are reviewed at length with the patient today.  Concerns regarding medicines are outlined above.  No orders of the defined types were placed in this encounter.  No orders of the defined types were placed in this encounter.   There are no Patient Instructions on file for this visit.   Signed, Sinclair Grooms, MD  12/04/2021 1:53 PM    [facility]

## 2021-12-06 ENCOUNTER — Ambulatory Visit: Payer: Medicare Other | Admitting: Interventional Cardiology

## 2021-12-06 DIAGNOSIS — I442 Atrioventricular block, complete: Secondary | ICD-10-CM

## 2021-12-06 DIAGNOSIS — N186 End stage renal disease: Secondary | ICD-10-CM

## 2021-12-06 DIAGNOSIS — I5022 Chronic systolic (congestive) heart failure: Secondary | ICD-10-CM

## 2021-12-06 DIAGNOSIS — I25118 Atherosclerotic heart disease of native coronary artery with other forms of angina pectoris: Secondary | ICD-10-CM

## 2021-12-06 DIAGNOSIS — E7800 Pure hypercholesterolemia, unspecified: Secondary | ICD-10-CM

## 2021-12-25 NOTE — Progress Notes (Signed)
[facility] Summary Report / Loop Recorder 

## 2021-12-27 ENCOUNTER — Ambulatory Visit (INDEPENDENT_AMBULATORY_CARE_PROVIDER_SITE_OTHER): Payer: Medicare Other

## 2021-12-27 DIAGNOSIS — I442 Atrioventricular block, complete: Secondary | ICD-10-CM | POA: Diagnosis not present

## 2021-12-27 LAB — CUP PACEART REMOTE DEVICE CHECK
Date Time Interrogation Session: 20231203230906
Implantable Pulse Generator Implant Date: 20210412

## 2022-01-05 ENCOUNTER — Telehealth: Payer: Self-pay | Admitting: Interventional Cardiology

## 2022-01-05 ENCOUNTER — Telehealth: Payer: Self-pay

## 2022-01-05 NOTE — Telephone Encounter (Signed)
Pt called back regarding message received in [place] Triage.    Pt former Dr. Tamala Julian Pt overdue for 6 Month office visit.  Pt told per Dr. Tamala Julian that Dr. Marlou Porch, Dr. Johney Frame, Dr. Gasper Sells accepting new patients.    Pt chose Dr. Marlou Porch, and was scheduled for 01/07/2022 appointment.

## 2022-01-05 NOTE — Telephone Encounter (Signed)
Patient calling to see who dr Tamala Julian will recommend him to see. Please advise

## 2022-01-07 ENCOUNTER — Ambulatory Visit: Payer: Medicare Other | Attending: Cardiology | Admitting: Cardiology

## 2022-01-07 ENCOUNTER — Encounter: Payer: Self-pay | Admitting: Cardiology

## 2022-01-07 VITALS — BP 90/50 | HR 88 | Ht 67.0 in | Wt 150.0 lb

## 2022-01-07 DIAGNOSIS — N186 End stage renal disease: Secondary | ICD-10-CM | POA: Insufficient documentation

## 2022-01-07 DIAGNOSIS — Z992 Dependence on renal dialysis: Secondary | ICD-10-CM | POA: Insufficient documentation

## 2022-01-07 DIAGNOSIS — I25118 Atherosclerotic heart disease of native coronary artery with other forms of angina pectoris: Secondary | ICD-10-CM | POA: Insufficient documentation

## 2022-01-07 DIAGNOSIS — Z9889 Other specified postprocedural states: Secondary | ICD-10-CM | POA: Diagnosis not present

## 2022-01-07 DIAGNOSIS — I639 Cerebral infarction, unspecified: Secondary | ICD-10-CM

## 2022-01-07 DIAGNOSIS — I959 Hypotension, unspecified: Secondary | ICD-10-CM | POA: Insufficient documentation

## 2022-01-07 NOTE — Patient Instructions (Signed)
Medication Instructions:  The current medical regimen is effective;  continue present plan and medications.  *If you need a refill on your cardiac medications before your next appointment, please call your pharmacy*   Follow-Up: At [facility], you and your health needs are our priority.  As part of our continuing mission to provide you with exceptional heart care, we have created designated Provider Care Teams.  These Care Teams include your primary Cardiologist (physician) and Advanced Practice Providers (APPs -  Physician Assistants and Nurse Practitioners) who all work together to provide you with the care you need, when you need it.  We recommend signing up for the patient portal called "MyChart".  Sign up information is provided on this After Visit Summary.  MyChart is used to connect with patients for Virtual Visits (Telemedicine).  Patients are able to view lab/test results, encounter notes, upcoming appointments, etc.  Non-urgent messages can be sent to your provider as well.   To learn more about what you can do with MyChart, go to [email or web address].    Your next appointment:   6 month(s)  The format for your next appointment:   In Person  Provider:   Richardson Dopp, PA-C     Then, Dr Candee Furbish will plan to see you again in 1 year(s).     Important Information About Sugar

## 2022-01-07 NOTE — Progress Notes (Signed)
Cardiology Office Note:    Date:  01/07/2022   ID:  Nicholas [name], DOB June 26, 1930, MRN [id number]  PCP:  Windy Fast, MD   [place] [facility] Cardiologist:  Candee Furbish, MD Electrophysiologist:  Virl Axe, MD     Referring MD: Windy Fast, MD    History of Present Illness:    Nicholas [name] is a 88 y.o. male here for the follow-up of coronary artery disease.  Former patient of Dr. Thompson Caul.  Also seen by Dr. Caryl Comes.  He had been admitted to the hospital 3 times in 2023.  March had left MCA stroke.  This was thought to be secondary to hypotension.  Isosorbide was discontinued at that time and placed on midodrine.  Placed on aspirin and Plavix.  Nicholas [name].   In April 2023 was admitted with a GI bleed.  Received transfusions.  Antiplatelet regimen was held then restarted.  In April 2023 had recurrent lower GI bleed and near syncopal event felt to be vasovagal.  Got transfused again.  Cardiology saw him for chest pain and elevated troponins.  They were fairly flat felt to be demand ischemia/type II MI.  Antiplatelet therapy was held but then he was discharged on isolated Plavix but not aspirin.  Has episodic chest discomfort.  Chronic O2.  Is on hemodialysis.  Feels ok. Not hurting anywhere. No severe CP now. Uses NTG when he does have. Sensation runs up abd, esop "ice melts" sometimes wakes him up.   Patient Profile: ESRD; dialysis TThSa Recurrent syncope S/p ILR Eval by EP - AV block w narrow QRS (no pacer needed) Coronary artery disease  NSTEMI 06/2018 s/p orbital atherectomy, DES to LAD D1 80 (jailed by stent); OM1 100; pLCx 80  Chronic angina Cath 05/2020:  dLM 40-60; pLAD stent patent w 30 ISR, D1 jailed 100 w collats; mLAD 50-70; pLCx 80; OM 100 (L-L collats); RCA 100 (R-R and L-R collats)>> Med Rx  HFimpEF (heart failure with improved ejection fraction)  Ischemic CM; EF 35-40 >> improved to normal Echocardiogram 03/2021: EF 60-65 Diverticulosis s/p prior GI  bleed Chronic Obstructive Pulmonary Disease O2 dependent Diabetes mellitus  Hypertension  Hyperlipidemia  NSVT  Anemia of chronic disease  Ascending thoracic aortic aneurysm CT 07/2020: 4.1 cm  Aortic atherosclerosis  Recurrent R pleural effusion   Prior CV Studies: LEFT HEART CATH AND CORONARY ANGIOGRAPHY 06/05/2020 dLM 40-60 pLAD stent patent w 30 ISR, D1 jailed 100 w collats; mLAD 50-70 pLCx 80; OM 100 (L-L collats) RCA 100 (R-R and L-R collats)>> Med Rx       CARDIAC TELEMETRY MONITORING-INTERPRETATION ONLY 03/20/2019 Narrative  Normal sinus rhythm  PACs and PVCs are noted.  Nonsustained VT at rates less than 140 and duration 5 beats was noted.  No atrial fibrillation.  Brief ectopic atrial tachycardia.   Overall, PACs, PVCs, nonsustained SVT, but no malignant arrhythmias noted.  No pauses are noted.   ECHO COMPLETE WO IMAGING ENHANCING AGENT 04/16/2021 EF 60-65, no RWMA, GR 1 DD, normal RVSF, mildly elevated PASP, mild LAE, trivial MR, AV sclerosis without stenosis  Past Medical History:  Diagnosis Date   Anemia    Arthritis    AV block    CAD (coronary artery disease)    Chronic kidney disease    Chronic respiratory failure (HCC)    COPD (chronic obstructive pulmonary disease) ([place])    Diabetes mellitus ([place])    Dialysis patient ([place])    End stage renal disease ([place])    GERD (gastroesophageal  reflux disease)    occ   GI bleeding    Heart murmur    History of loop recorder    Hypercholesteremia    Hypertension    Psoriasis    Sinus bradycardia    Syncope     Past Surgical History:  Procedure Laterality Date   CARDIAC CATHETERIZATION  09   "some blockage" with collaterals by cath at [facility] ~ 2009; no intervention required; reportedly, no routine cardiology f/u recommended    COLONOSCOPY WITH PROPOFOL N/A 07/06/2020   Procedure: COLONOSCOPY WITH PROPOFOL;  Surgeon: Ronald Lobo, MD;  Location: [facility] ENDOSCOPY;  Service: Endoscopy;  Laterality: N/A;    CORONARY ATHERECTOMY N/A 08/09/2018   Procedure: CORONARY ATHERECTOMY;  Surgeon: Jettie Booze, MD;  Location: [place] CV LAB;  Service: Cardiovascular;  Laterality: N/A;   CORONARY STENT INTERVENTION N/A 08/09/2018   Procedure: CORONARY STENT INTERVENTION;  Surgeon: Jettie Booze, MD;  Location: [place] CV LAB;  Service: Cardiovascular;  Laterality: N/A;   ESOPHAGOGASTRODUODENOSCOPY (EGD) WITH PROPOFOL N/A 07/06/2020   Procedure: ESOPHAGOGASTRODUODENOSCOPY (EGD) WITH PROPOFOL;  Surgeon: Ronald Lobo, MD;  Location: [place];  Service: Endoscopy;  Laterality: N/A;   INGUINAL HERNIA REPAIR Right 04/13/2012   Procedure: HERNIA REPAIR INGUINAL INCARCERATED;  Surgeon: Gayland Curry, MD;  Location: [place];  Service: General;  Laterality: Right;   INSERTION OF MESH Right 04/13/2012   Procedure: INSERTION OF MESH;  Surgeon: Gayland Curry, MD;  Location: [place];  Service: General;  Laterality: Right;   IR THORACENTESIS ASP PLEURAL SPACE W/IMG GUIDE  06/08/2020   LEFT HEART CATH N/A 08/09/2018   Procedure: Left Heart Cath;  Surgeon: Jettie Booze, MD;  Location: [place] CV LAB;  Service: Cardiovascular;  Laterality: N/A;   LEFT HEART CATH AND CORONARY ANGIOGRAPHY N/A 08/08/2018   Procedure: LEFT HEART CATH AND CORONARY ANGIOGRAPHY;  Surgeon: Troy Sine, MD;  Location: [place] CV LAB;  Service: Cardiovascular;  Laterality: N/A;   LEFT HEART CATH AND CORONARY ANGIOGRAPHY N/A 06/05/2020   Procedure: LEFT HEART CATH AND CORONARY ANGIOGRAPHY;  Surgeon: Belva Crome, MD;  Location: [place] CV LAB;  Service: Cardiovascular;  Laterality: N/A;   VASECTOMY      Current Medications: Current Meds  Medication Sig   acetaminophen (TYLENOL) 500 MG tablet Take 500 mg by mouth 3 (three) times daily as needed for mild pain, moderate pain or headache.   albuterol (VENTOLIN HFA) 108 (90 Base) MCG/ACT inhaler Inhale 2 puffs into the lungs 4 (four) times daily as needed for  shortness of breath.   atorvastatin (LIPITOR) 80 MG tablet Take 80 mg by mouth at bedtime.   cinacalcet (SENSIPAR) 30 MG tablet Take 30 mg by mouth See admin instructions. Take 30 mg by mouth prior to dialysis on Tues/Thurs/Sat   clopidogrel (PLAVIX) 75 MG tablet Take 1 tablet (75 mg total) by mouth daily with breakfast. Restart after 5 days   diphenhydrAMINE (BENADRYL) 25 mg capsule Take 25 mg by mouth daily as needed for itching.   Ensure Plus (ENSURE PLUS) LIQD Take 237 mLs by mouth 2 (two) times daily between meals. Chocolate   insulin NPH-regular Human (70-30) 100 UNIT/ML injection Inject 10 Units into the skin daily with breakfast. Takes 20 units in the morning if blood sugar is over 140   midodrine (PROAMATINE) 5 MG tablet Take 1 tablet (5 mg total) by mouth 3 (three) times daily with meals.   nitroGLYCERIN (NITROSTAT) 0.4 MG SL tablet Place  1 tablet (0.4 mg total) under the tongue every 5 (five) minutes x 3 doses as needed for chest pain.   psyllium (HYDROCIL/METAMUCIL) 95 % PACK Take 1 packet by mouth daily.     Allergies:   Hydrochlorothiazide and Isosorbide dinitrate   Social History   Socioeconomic History   Marital status: Married    Spouse name: Not on file   Number of children: Not on file   Years of education: Not on file   Highest education level: Not on file  Occupational History   Not on file  Tobacco Use   Smoking status: Former    Packs/day: 1.00    Years: 15.00    Total pack years: 15.00    Types: Cigarettes    Quit date: 03/29/1987    Years since quitting: 34.8   Smokeless tobacco: Never  Substance and Sexual Activity   Alcohol use: No   Drug use: No   Sexual activity: Not on file  Other Topics Concern   Not on file  Social History Narrative   Not on file   Social Determinants of Health   Financial Resource Strain: Not on file  Food Insecurity: No Food Insecurity (04/23/2021)   Hunger Vital Sign    Worried About Running Out of Food in the Last Year:  Never true    Ran Out of Food in the Last Year: Never true  Transportation Needs: No Transportation Needs (04/23/2021)   PRAPARE - Hydrologist (Medical): No    Lack of Transportation (Non-Medical): No  Physical Activity: Not on file  Stress: Not on file  Social Connections: Not on file     Family History: The patient's family history includes Diabetes in his brother, mother, and sister; Stroke in his father and mother.  ROS:   Please see the history of present illness.     All other systems reviewed and are negative.  EKGs/Labs/Other Studies Reviewed:      EKG:  The ekg ordered today demonstrates none today  Recent Labs: 10/24/2021: ALT 13; BUN 25; Creatinine, Ser 6.24; Hemoglobin 7.9; Magnesium 2.0; Platelets 304; Potassium 3.7; Sodium 135  Recent Lipid Panel    Component Value Date/Time   CHOL 198 04/16/2021 0210   TRIG 87 04/16/2021 0210   HDL 65 04/16/2021 0210   CHOLHDL 3.0 04/16/2021 0210   VLDL 17 04/16/2021 0210   LDLCALC 116 (H) 04/16/2021 0210     Risk Assessment/Calculations:               Physical Exam:    VS:  BP (!) 90/50 (BP Location: Left Arm, Patient Position: Sitting, Cuff Size: Normal)   Pulse 88   Ht '5\' 7"'$  (1.702 m)   Wt 150 lb (68 kg)   BMI 23.49 kg/m     Wt Readings from Last 3 Encounters:  01/07/22 150 lb (68 kg)  12/01/21 145 lb (65.8 kg)  06/02/21 157 lb 6.4 oz (71.4 kg)     GEN: In wheelchair, oxygen on, well nourished, well developed in no acute distress HEENT: Normal NECK: No JVD; No carotid bruits LYMPHATICS: No lymphadenopathy CARDIAC: RRR, soft systolic murmur, no rubs, gallops RESPIRATORY:  Clear to auscultation without rales, wheezing or rhonchi  ABDOMEN: Soft, non-tender, non-distended MUSCULOSKELETAL:  No edema; No deformity  SKIN: Warm and dry NEUROLOGIC:  Alert and oriented x 3 PSYCHIATRIC:  Normal affect   ASSESSMENT:    1. Coronary artery disease involving native coronary artery  of native  heart with other form of angina pectoris ([place])   2. History of loop recorder   3. Hypotension, unspecified hypotension type   4. ESRD on dialysis [facility])    PLAN:    In order of problems listed above:  Coronary artery disease Prior non-STEMI in 2020 with atherectomy and DES to the LAD. Cardiac catheterization 2022 patent stent in the LAD with severe disease in the circumflex occluded OM and occluded RCA.  Medically managed.  His sensation of occasional discomfort along the mid chest wall/abdominal wall that dissipates like ice melting is likely his stable chronic angina.  I did caution him against using too much nitroglycerin especially if pain is mild.  I do not want him to faint secondary to hypotension.  Had to stop isosorbide previously because of hypotension.  Not a candidate for [place], dialysis  Recurrent GI bleed Stable no recent transfusions.  Continue current therapy with Plavix 75 mg daily, Lipitor 80 mg daily and nitroglycerin as needed.     Hypercholesteremia Continue Lipitor 80 mg daily, excellent no myalgias   ESRD (end stage renal disease) on dialysis [facility]) He remains on dialysis every Tuesday, Thursday, Saturday.  Try to keep as low of a dry weight as possible.  Blood pressure obviously plays a factor.   Chronic respiratory insufficiency He is on chronic O2.  This is contributing significantly to his shortness of breath.  Try to maintain conditioning efforts.   Syncope and collapse History of ILR.  He has not had recurrent syncope.  Dr. Caryl Comes monitoring.                 Medication Adjustments/Labs and Tests Ordered: Current medicines are reviewed at length with the patient today.  Concerns regarding medicines are outlined above.  No orders of the defined types were placed in this encounter.  No orders of the defined types were placed in this encounter.   Patient Instructions  Medication Instructions:  The current medical regimen is effective;   continue present plan and medications.  *If you need a refill on your cardiac medications before your next appointment, please call your pharmacy*   Follow-Up: At [facility], you and your health needs are our priority.  As part of our continuing mission to provide you with exceptional heart care, we have created designated Provider Care Teams.  These Care Teams include your primary Cardiologist (physician) and Advanced Practice Providers (APPs -  Physician Assistants and Nurse Practitioners) who all work together to provide you with the care you need, when you need it.  We recommend signing up for the patient portal called "MyChart".  Sign up information is provided on this After Visit Summary.  MyChart is used to connect with patients for Virtual Visits (Telemedicine).  Patients are able to view lab/test results, encounter notes, upcoming appointments, etc.  Non-urgent messages can be sent to your provider as well.   To learn more about what you can do with MyChart, go to [email or web address].    Your next appointment:   6 month(s)  The format for your next appointment:   In Person  Provider:   Richardson Dopp, PA-C     Then, Dr Candee Furbish will plan to see you again in 1 year(s).     Important Information About Sugar         Signed, Candee Furbish, MD  01/07/2022 12:32 PM    [place]

## 2022-01-10 DIAGNOSIS — N261 Atrophy of kidney (terminal): Secondary | ICD-10-CM | POA: Diagnosis not present

## 2022-01-10 DIAGNOSIS — Z85528 Personal history of other malignant neoplasm of kidney: Secondary | ICD-10-CM | POA: Diagnosis not present

## 2022-01-10 DIAGNOSIS — N2889 Other specified disorders of kidney and ureter: Secondary | ICD-10-CM | POA: Diagnosis not present

## 2022-01-10 DIAGNOSIS — Z905 Acquired absence of kidney: Secondary | ICD-10-CM | POA: Diagnosis not present

## 2022-01-31 ENCOUNTER — Ambulatory Visit (INDEPENDENT_AMBULATORY_CARE_PROVIDER_SITE_OTHER): Payer: Medicare Other

## 2022-01-31 DIAGNOSIS — I442 Atrioventricular block, complete: Secondary | ICD-10-CM | POA: Diagnosis not present

## 2022-02-01 LAB — CUP PACEART REMOTE DEVICE CHECK
Date Time Interrogation Session: 20240107231717
Implantable Pulse Generator Implant Date: 20210412

## 2022-02-03 NOTE — Progress Notes (Signed)
[facility] Summary Report / Loop Recorder 

## 2022-03-06 LAB — CUP PACEART REMOTE DEVICE CHECK
Date Time Interrogation Session: 20240209230850
Implantable Pulse Generator Implant Date: 20210412

## 2022-03-07 ENCOUNTER — Ambulatory Visit: Payer: Medicare Other

## 2022-03-07 DIAGNOSIS — I442 Atrioventricular block, complete: Secondary | ICD-10-CM | POA: Diagnosis not present

## 2022-03-07 NOTE — Progress Notes (Signed)
[facility] Summary Report / Loop Recorder 

## 2022-03-21 DIAGNOSIS — J90 Pleural effusion, not elsewhere classified: Secondary | ICD-10-CM | POA: Diagnosis not present

## 2022-03-21 DIAGNOSIS — J9611 Chronic respiratory failure with hypoxia: Secondary | ICD-10-CM | POA: Diagnosis not present

## 2022-03-30 DIAGNOSIS — J439 Emphysema, unspecified: Secondary | ICD-10-CM | POA: Diagnosis not present

## 2022-03-30 DIAGNOSIS — J90 Pleural effusion, not elsewhere classified: Secondary | ICD-10-CM | POA: Diagnosis not present

## 2022-03-30 DIAGNOSIS — J189 Pneumonia, unspecified organism: Secondary | ICD-10-CM | POA: Diagnosis not present

## 2022-04-11 ENCOUNTER — Ambulatory Visit (INDEPENDENT_AMBULATORY_CARE_PROVIDER_SITE_OTHER): Payer: Medicare Other

## 2022-04-11 DIAGNOSIS — R55 Syncope and collapse: Secondary | ICD-10-CM

## 2022-04-12 LAB — CUP PACEART REMOTE DEVICE CHECK
Date Time Interrogation Session: 20240317231539
Implantable Pulse Generator Implant Date: 20210412

## 2022-04-20 NOTE — Progress Notes (Signed)
[facility] Summary Report / Loop Recorder 

## 2022-05-16 ENCOUNTER — Ambulatory Visit (INDEPENDENT_AMBULATORY_CARE_PROVIDER_SITE_OTHER): Payer: Medicare Other

## 2022-05-16 DIAGNOSIS — R55 Syncope and collapse: Secondary | ICD-10-CM | POA: Diagnosis not present

## 2022-05-16 LAB — CUP PACEART REMOTE DEVICE CHECK
Date Time Interrogation Session: 20240421230922
Implantable Pulse Generator Implant Date: 20210412

## 2022-05-18 DIAGNOSIS — L84 Corns and callosities: Secondary | ICD-10-CM | POA: Diagnosis not present

## 2022-05-18 DIAGNOSIS — E1159 Type 2 diabetes mellitus with other circulatory complications: Secondary | ICD-10-CM | POA: Diagnosis not present

## 2022-05-18 DIAGNOSIS — B351 Tinea unguium: Secondary | ICD-10-CM | POA: Diagnosis not present

## 2022-05-23 NOTE — Progress Notes (Signed)
[facility] Summary Report / Loop Recorder 

## 2022-05-25 DIAGNOSIS — J439 Emphysema, unspecified: Secondary | ICD-10-CM | POA: Diagnosis not present

## 2022-05-25 DIAGNOSIS — J90 Pleural effusion, not elsewhere classified: Secondary | ICD-10-CM | POA: Diagnosis not present

## 2022-06-21 ENCOUNTER — Ambulatory Visit (INDEPENDENT_AMBULATORY_CARE_PROVIDER_SITE_OTHER): Payer: Medicare Other

## 2022-06-21 DIAGNOSIS — R55 Syncope and collapse: Secondary | ICD-10-CM | POA: Diagnosis not present

## 2022-06-21 LAB — CUP PACEART REMOTE DEVICE CHECK
Date Time Interrogation Session: 20240524230440
Implantable Pulse Generator Implant Date: 20210412

## 2022-06-21 NOTE — Progress Notes (Signed)
[facility] Summary Report / Loop Recorder 

## 2022-07-07 NOTE — Progress Notes (Addendum)
Cardiology Office Note:    Date:  07/08/2022  ID:  Nicholas [name], DOB 1930-03-14, MRN [id number] PCP: Sondra Come, MD  [facility] Providers Cardiologist:  Donato Schultz, MD Electrophysiologist:  Sherryl Manges, MD       Patient Profile:      ESRD; dialysis TThSa Recurrent syncope S/p ILR Eval by EP - AV block w narrow QRS (no pacer needed) Coronary artery disease w chronic angina NSTEMI 07/2018 s/p orbital atherectomy, 3.5 x 16 mm DES to LAD LHC 05/2020:  dLM 40-60; pLAD stent patent w 30 ISR, D1 jailed 100 w collats; mLAD 50-70; pLCx 80; OM 100 (L-L collats); RCA 100 (R-R and L-R collats)>> Med Rx  HFimpEF (heart failure with improved ejection fraction)  Ischemic CM; EF 35-40 >> improved to normal TTE 04/16/21: EF 60-65, no RWMA, Gr 1 DD, NL RVSF, mildly elevated PASP, mild LAE, trivial MR, AV sclerosis, RAP 3, RVSP 36.6 S/p L MCA CVA in 03/2021 (? 2? to ? BP) Isosorbide DC'd due to ? BP Diverticulosis s/p prior GI bleed Chronic Obstructive Pulmonary Disease, O2 dependent Diabetes mellitus  Hypertension  Hyperlipidemia  NSVT  Anemia of chronic disease  Ascending thoracic aortic aneurysm CT 07/2020: 4.1 cm  Aortic atherosclerosis  Recurrent R pleural effusion      History of Present Illness:   Nicholas [name] is a 89 y.o. male prior pt of Dr. Katrinka Blazing, now seeing Dr. Anne Fu, who returns for f/u of CAD. He was last seen by Dr. Anne Fu in 12/2021. He is here with his wife. He arrives in a wheelchair. He is fairly sedentary. His anginal pattern is overall stable. He has been using less NTG. Overall, he feels better. His shortness of breath is stable. He is O2 dependent. He has not had syncope. He has been getting to his dry weight at dialysis.   Review of Systems  Hematologic/Lymphatic: Does not bruise/bleed easily.   see HPI    Studies Reviewed:    EKG:  not done   Risk Assessment/Calculations:             Physical Exam:   VS:  BP (!) 110/52   Pulse 88   Ht 5\' 8"  (1.727  m)   Wt 154 lb 3.2 oz (69.9 kg)   SpO2 91%   BMI 23.45 kg/m    Wt Readings from Last 3 Encounters:  07/08/22 154 lb 3.2 oz (69.9 kg)  01/07/22 150 lb (68 kg)  12/01/21 145 lb (65.8 kg)    Constitutional:      Appearance: Not in distress. Chronically ill-appearing.  Pulmonary:     Breath sounds: Normal breath sounds. No wheezing. No rales.  Cardiovascular:     Normal rate. Regular rhythm.     Murmurs: There is no murmur.  Edema:    Peripheral edema absent.  Abdominal:     Palpations: Abdomen is soft.       ASSESSMENT AND PLAN:   Coronary artery disease History of non-STEMI in 2020 treated with orbital atherectomy and DES to the LAD.  Cardiac catheterization in 2022 demonstrated a patent stent in the LAD, severe disease in the LCx and occluded OM and occluded RCA.  He is managed medically.  His anginal pattern is stable. He is taking less NTG and feels better. Continue Clopidogrel 75 mg once daily, Atorvastatin 80 mg daily. F/u 6 mos.    Hypercholesteremia Continue Atorvastatin 80 mg daily. Check direct LDL today. Goal LDL < 55.  Hypotension BP stable. He remains  on Midodrine 5 mg three times a day.  Syncope and collapse No recurrence. No events and no AFib noted on ILR.   Thoracic aortic aneurysm (HCC) 41 mm on CT done a [facility] in 05/2022. Stable. Continue with annual surveillance.   ESRD (end stage renal disease) on dialysis (HCC) Tues, Thurs, Sat dialysis.       Dispo:  Return in about 6 months (around 01/07/2023) for Routine Follow Up w Dr. Anne Fu.  Signed, Tereso Newcomer, PA-C

## 2022-07-08 ENCOUNTER — Encounter: Payer: Self-pay | Admitting: Physician Assistant

## 2022-07-08 ENCOUNTER — Ambulatory Visit: Payer: Medicare Other | Attending: Physician Assistant | Admitting: Physician Assistant

## 2022-07-08 VITALS — BP 110/52 | HR 88 | Ht 68.0 in | Wt 154.2 lb

## 2022-07-08 DIAGNOSIS — Z992 Dependence on renal dialysis: Secondary | ICD-10-CM | POA: Insufficient documentation

## 2022-07-08 DIAGNOSIS — R55 Syncope and collapse: Secondary | ICD-10-CM

## 2022-07-08 DIAGNOSIS — J984 Other disorders of lung: Secondary | ICD-10-CM | POA: Diagnosis not present

## 2022-07-08 DIAGNOSIS — I7121 Aneurysm of the ascending aorta, without rupture: Secondary | ICD-10-CM

## 2022-07-08 DIAGNOSIS — E7800 Pure hypercholesterolemia, unspecified: Secondary | ICD-10-CM | POA: Insufficient documentation

## 2022-07-08 DIAGNOSIS — I25118 Atherosclerotic heart disease of native coronary artery with other forms of angina pectoris: Secondary | ICD-10-CM | POA: Diagnosis not present

## 2022-07-08 DIAGNOSIS — J90 Pleural effusion, not elsewhere classified: Secondary | ICD-10-CM | POA: Diagnosis not present

## 2022-07-08 DIAGNOSIS — I712 Thoracic aortic aneurysm, without rupture, unspecified: Secondary | ICD-10-CM | POA: Insufficient documentation

## 2022-07-08 DIAGNOSIS — I959 Hypotension, unspecified: Secondary | ICD-10-CM | POA: Diagnosis not present

## 2022-07-08 DIAGNOSIS — R918 Other nonspecific abnormal finding of lung field: Secondary | ICD-10-CM | POA: Diagnosis not present

## 2022-07-08 DIAGNOSIS — R053 Chronic cough: Secondary | ICD-10-CM | POA: Diagnosis not present

## 2022-07-08 DIAGNOSIS — J439 Emphysema, unspecified: Secondary | ICD-10-CM | POA: Diagnosis not present

## 2022-07-08 DIAGNOSIS — N186 End stage renal disease: Secondary | ICD-10-CM

## 2022-07-08 HISTORY — DX: Thoracic aortic aneurysm, without rupture, unspecified: I71.20

## 2022-07-08 NOTE — Assessment & Plan Note (Signed)
Tues, Thurs, Sat dialysis.

## 2022-07-08 NOTE — Assessment & Plan Note (Signed)
Continue Atorvastatin 80 mg daily. Check direct LDL today. Goal LDL < 55.

## 2022-07-08 NOTE — Assessment & Plan Note (Signed)
History of non-STEMI in 2020 treated with orbital atherectomy and DES to the LAD.  Cardiac catheterization in 2022 demonstrated a patent stent in the LAD, severe disease in the LCx and occluded OM and occluded RCA.  He is managed medically.  His anginal pattern is stable. He is taking less NTG and feels better. Continue Clopidogrel 75 mg once daily, Atorvastatin 80 mg daily. F/u 6 mos.

## 2022-07-08 NOTE — Assessment & Plan Note (Signed)
No recurrence. No events and no AFib noted on ILR.

## 2022-07-08 NOTE — Assessment & Plan Note (Signed)
41 mm on CT done a [facility] in 05/2022. Stable. Continue with annual surveillance.

## 2022-07-08 NOTE — Patient Instructions (Signed)
Medication Instructions:  Your physician recommends that you continue on your current medications as directed. Please refer to the Current Medication list given to you today.  *If you need a refill on your cardiac medications before your next appointment, please call your pharmacy*   Lab Work: TODAY:  DIRECT LDL  If you have labs (blood work) drawn today and your tests are completely normal, you will receive your results only by: MyChart Message (if you have MyChart) OR A paper copy in the mail If you have any lab test that is abnormal or we need to change your treatment, we will call you to review the results.   Testing/Procedures: None ordered   Follow-Up: At [facility], you and your health needs are our priority.  As part of our continuing mission to provide you with exceptional heart care, we have created designated Provider Care Teams.  These Care Teams include your primary Cardiologist (physician) and Advanced Practice Providers (APPs -  Physician Assistants and Nurse Practitioners) who all work together to provide you with the care you need, when you need it.  We recommend signing up for the patient portal called "MyChart".  Sign up information is provided on this After Visit Summary.  MyChart is used to connect with patients for Virtual Visits (Telemedicine).  Patients are able to view lab/test results, encounter notes, upcoming appointments, etc.  Non-urgent messages can be sent to your provider as well.   To learn more about what you can do with MyChart, go to [email or web address].    Your next appointment:   6 month(s)  Provider:   Donato Schultz, MD     Other Instructions

## 2022-07-08 NOTE — Assessment & Plan Note (Signed)
BP stable. He remains on Midodrine 5 mg three times a day.

## 2022-07-09 LAB — LDL CHOLESTEROL, DIRECT: LDL Direct: 131 mg/dL — ABNORMAL HIGH (ref 0–99)

## 2022-07-14 ENCOUNTER — Telehealth: Payer: Self-pay | Admitting: Physician Assistant

## 2022-07-14 DIAGNOSIS — E785 Hyperlipidemia, unspecified: Secondary | ICD-10-CM

## 2022-07-14 DIAGNOSIS — E7800 Pure hypercholesterolemia, unspecified: Secondary | ICD-10-CM

## 2022-07-14 MED ORDER — EZETIMIBE 10 MG PO TABS: 10.0000 mg | ORAL_TABLET | Freq: Every day | ORAL | 3 refills | Status: AC

## 2022-07-14 NOTE — Telephone Encounter (Signed)
Patient's wife is returning RMA's call for 06/14 lab results. Please advise.

## 2022-07-14 NOTE — Telephone Encounter (Signed)
Spoke to the pt wife [place], [place] explained Tereso Newcomer, PA-C recommendations:  LDL above goal. Goal is < 55. Current LDL is 131. PLAN: -Continue Atorvastatin 80 mg once daily -Start Zetia 10 mg once daily -Lipids, LFTs in 3 mos   Verified and sent new prescription to the pharmacy on file. Patient is scheduled for lab appointment on 10/02/22. Pt wife voiced understanding.

## 2022-07-15 NOTE — Progress Notes (Signed)
[facility] Summary Report / Loop Recorder 

## 2022-07-15 NOTE — Telephone Encounter (Signed)
Returned call to pt.  He wanted to make me aware that we needed to send any prescription he need to Dr. Joseph Art at the [facility], fax # [phone], or the pharmacy will not fill it.  Sent copy of pt's lab results and prescription for Zetia to Dr. Joseph Art.

## 2022-07-15 NOTE — Telephone Encounter (Signed)
Pt is requesting a callback since his wife spoke with the nurse yesterday, she'd unable to tell him how the conversation went so he'd like to speak with a nurse himself at [phone]. Please advise.

## 2022-07-21 LAB — CUP PACEART REMOTE DEVICE CHECK
Date Time Interrogation Session: 20240626230557
Implantable Pulse Generator Implant Date: 20210412

## 2022-07-25 ENCOUNTER — Ambulatory Visit (INDEPENDENT_AMBULATORY_CARE_PROVIDER_SITE_OTHER): Payer: Medicare Other

## 2022-07-25 DIAGNOSIS — R55 Syncope and collapse: Secondary | ICD-10-CM

## 2022-07-25 DIAGNOSIS — E1159 Type 2 diabetes mellitus with other circulatory complications: Secondary | ICD-10-CM | POA: Diagnosis not present

## 2022-07-25 DIAGNOSIS — L84 Corns and callosities: Secondary | ICD-10-CM | POA: Diagnosis not present

## 2022-07-25 DIAGNOSIS — B351 Tinea unguium: Secondary | ICD-10-CM | POA: Diagnosis not present

## 2022-08-11 NOTE — Progress Notes (Signed)
[facility] Summary Report / Loop Recorder 

## 2022-08-22 ENCOUNTER — Ambulatory Visit (INDEPENDENT_AMBULATORY_CARE_PROVIDER_SITE_OTHER): Payer: Medicare Other

## 2022-08-22 DIAGNOSIS — R053 Chronic cough: Secondary | ICD-10-CM | POA: Diagnosis not present

## 2022-08-22 DIAGNOSIS — R55 Syncope and collapse: Secondary | ICD-10-CM | POA: Diagnosis not present

## 2022-08-22 DIAGNOSIS — J90 Pleural effusion, not elsewhere classified: Secondary | ICD-10-CM | POA: Diagnosis not present

## 2022-08-22 DIAGNOSIS — J9611 Chronic respiratory failure with hypoxia: Secondary | ICD-10-CM | POA: Diagnosis not present

## 2022-09-07 NOTE — Progress Notes (Signed)
[facility] Summary Report / Loop Recorder 

## 2022-09-22 ENCOUNTER — Ambulatory Visit (INDEPENDENT_AMBULATORY_CARE_PROVIDER_SITE_OTHER): Payer: Medicare Other

## 2022-09-22 DIAGNOSIS — I442 Atrioventricular block, complete: Secondary | ICD-10-CM | POA: Diagnosis not present

## 2022-09-27 LAB — CUP PACEART REMOTE DEVICE CHECK
Date Time Interrogation Session: 20240831230310
Implantable Pulse Generator Implant Date: 20210412

## 2022-09-29 NOTE — Progress Notes (Signed)
[facility] Summary Report / Loop Recorder 

## 2022-10-03 ENCOUNTER — Ambulatory Visit: Payer: Medicare Other | Attending: Internal Medicine

## 2022-10-03 DIAGNOSIS — E1159 Type 2 diabetes mellitus with other circulatory complications: Secondary | ICD-10-CM | POA: Diagnosis not present

## 2022-10-03 DIAGNOSIS — B351 Tinea unguium: Secondary | ICD-10-CM | POA: Diagnosis not present

## 2022-10-03 DIAGNOSIS — L84 Corns and callosities: Secondary | ICD-10-CM | POA: Diagnosis not present

## 2022-10-14 ENCOUNTER — Other Ambulatory Visit: Payer: Medicare Other

## 2022-11-16 DIAGNOSIS — L89892 Pressure ulcer of other site, stage 2: Secondary | ICD-10-CM | POA: Diagnosis not present

## 2022-11-16 DIAGNOSIS — L03032 Cellulitis of left toe: Secondary | ICD-10-CM | POA: Diagnosis not present

## 2022-11-16 DIAGNOSIS — E11621 Type 2 diabetes mellitus with foot ulcer: Secondary | ICD-10-CM | POA: Diagnosis not present

## 2022-11-24 ENCOUNTER — Ambulatory Visit (INDEPENDENT_AMBULATORY_CARE_PROVIDER_SITE_OTHER): Payer: Medicare Other

## 2022-11-24 DIAGNOSIS — I442 Atrioventricular block, complete: Secondary | ICD-10-CM

## 2022-11-24 LAB — CUP PACEART REMOTE DEVICE CHECK
Date Time Interrogation Session: 20241030230140
Implantable Pulse Generator Implant Date: 20210412

## 2022-12-07 DIAGNOSIS — L84 Corns and callosities: Secondary | ICD-10-CM | POA: Diagnosis not present

## 2022-12-07 DIAGNOSIS — B351 Tinea unguium: Secondary | ICD-10-CM | POA: Diagnosis not present

## 2022-12-07 DIAGNOSIS — E1159 Type 2 diabetes mellitus with other circulatory complications: Secondary | ICD-10-CM | POA: Diagnosis not present

## 2022-12-07 NOTE — Progress Notes (Signed)
[facility] Summary Report / Loop Recorder 

## 2022-12-26 ENCOUNTER — Ambulatory Visit (INDEPENDENT_AMBULATORY_CARE_PROVIDER_SITE_OTHER): Payer: Medicare Other

## 2022-12-26 DIAGNOSIS — I442 Atrioventricular block, complete: Secondary | ICD-10-CM

## 2022-12-27 LAB — CUP PACEART REMOTE DEVICE CHECK
Date Time Interrogation Session: 20241202230041
Implantable Pulse Generator Implant Date: 20210412

## 2022-12-28 ENCOUNTER — Ambulatory Visit: Payer: Medicare Other | Attending: Cardiology | Admitting: Cardiology

## 2022-12-28 NOTE — Progress Notes (Unsigned)
  Cardiology Office Note:  .   Date:  12/28/2022  ID:  [name], DOB 13-May-1930, MRN [id number] PCP: Sondra Come, MD  [facility] Providers Cardiologist:  Donato Schultz, MD Electrophysiologist:  Sherryl Manges, MD { Click to update primary MD,subspecialty MD or APP then REFRESH:1}   History of Present Illness: .   Nicholas [name] is a 89 y.o. male Discussed the use of AI scribe software for clinical note transcription with the patient, who gave verbal consent to proceed.  History of Present Illness              ROS: ***  Studies Reviewed: .        *** Risk Assessment/Calculations:   {Does this patient have ATRIAL FIBRILLATION?:[phone]} No BP recorded.  {Refresh Note OR Click here to enter BP  :1}***       Physical Exam:   VS:  There were no vitals taken for this visit.   Wt Readings from Last 3 Encounters:  07/08/22 154 lb 3.2 oz (69.9 kg)  01/07/22 150 lb (68 kg)  12/01/21 145 lb (65.8 kg)    GEN: Well nourished, well developed in no acute distress NECK: No JVD; No carotid bruits CARDIAC: IRRR, no murmurs, no rubs, no gallops RESPIRATORY:  Clear to auscultation without rales, wheezing or rhonchi  ABDOMEN: Soft, non-tender, non-distended EXTREMITIES:  No edema; No deformity   ASSESSMENT AND PLAN: .    Assessment and Plan                {Are you ordering a CV Procedure (e.g. stress test, cath, DCCV, TEE, etc)?   Press F2        :[id number]}  Dispo: ***  Signed, Donato Schultz, MD

## 2022-12-29 ENCOUNTER — Encounter: Payer: Self-pay | Admitting: Cardiology

## 2023-01-26 ENCOUNTER — Ambulatory Visit (INDEPENDENT_AMBULATORY_CARE_PROVIDER_SITE_OTHER): Payer: Medicare Other

## 2023-01-26 DIAGNOSIS — I442 Atrioventricular block, complete: Secondary | ICD-10-CM

## 2023-01-26 LAB — CUP PACEART REMOTE DEVICE CHECK
Date Time Interrogation Session: 20250101230105
Implantable Pulse Generator Implant Date: 20210412

## 2023-02-19 NOTE — Progress Notes (Unsigned)
Cardiology Office Note:  .   Date:  02/22/2023  ID:  Nicholas [name], DOB 1930-07-18, MRN [id number] PCP: Sondra Come, MD  [facility] Providers Cardiologist:  Donato Schultz, MD Electrophysiologist:  Sherryl Manges, MD    History of Present Illness: .   Nicholas [name] is a 89 y.o. male with history of CAD, ESRD on HD, HFimprEF, CVA, syncope, s/p ILR, HTN, HLD, COPD on O2, NSVT, DM.  Patient comes in with his son. He's had worsening DOE on O2-walking to the bathroom or car. No fluid build up or edema.Has appt with pulmonary next month. Feels like dialysis is pulling enough fluid off. They lowered his dry weight because he's lost some weight. No palpitations, dizziness. Blood work done monthly at dialysis.Sometimes when he has bad dreams it wakes him up and he has a heaviness in his chest and it goes away quickly. Imdur used to help but it was stopped due to low BP's. Still on midodrine.  ROS:    Studies Reviewed: Marland Kitchen     EKG Interpretation Date/Time:  Wednesday February 22 2023 10:10:55 EST Ventricular Rate:  98 PR Interval:  180 QRS Duration:  94 QT Interval:  360 QTC Calculation: 459 R Axis:   -24  Text Interpretation: Normal sinus rhythm Cannot rule out Anterior infarct , age undetermined ST & T wave abnormality, consider lateral ischemia When compared with ECG of 02-Dec-2021 02:19, PREVIOUS ECG IS PRESENT Confirmed by Jacolyn Reedy [phone]) on 02/22/2023 10:19:23 AM    Prior CV Studies:     LHC 2022 Mid LAD lesion is 60% stenosed.   40 to 60% eccentric distal left main not greatly different than on prior angiography.  Left main is difficult to lay out and not easily visualized due to a loop recorder that block imaging. The peviously stented proximal LAD contains 30% in-stent restenosis.  The jailed diagonal is totally occluded but supplied by collaterals.  The mid LAD beyond the stent contains a 50-70 % eccentric narrowing unchanged from prior imaging. Circumflex territory is  small.  The proximal vessel contains 80% stenosis and is unchanged compared to the prior angiogram.  A large obtuse marginal is totally occluded and supplied by left to left collaterals. Right coronary is totally occluded in the distal vessel receives right to right and left to right collaterals. LVEDP 10 mmHg.  Ventriculography was not performed.   RECOMMENDATIONS:   Compared to prior images, left main is about the same as before.  Symptoms could be related to the left main.  The patient's age, comorbidities, and unwillingness to consider coronary bypass grafting leave few options for management.  PCI would be single remaining vessel and high risk.  Discussed with patient who prefers conservative management at this point. Continue medical therapy. Guarded prognosis.  Cath 2020 Prox LAD lesion is 90% stenosed. A drug-eluting stent was successfully placed using a STENT SYNERGY DES 3.5X16. 1st Diag lesion is 80% stenosed and jailed by the stent. TIMIT 1 flow. 1st Mrg lesion is 100% stenosed. Prox Cx lesion is 80% stenosed. Post intervention, there is a 0% residual stenosis. LV end diastolic pressure is normal. There is no aortic valve stenosis.   Continue clopidogrel along with aggressive secondary prevention.     Dialysis later today.    Risk Assessment/Calculations:             Physical Exam:   VS:  BP 108/62   Pulse 95   Ht 5\' 8"  (1.727 m)   Wt 134 lb  3.2 oz (60.9 kg)   SpO2 95%   BMI 20.41 kg/m    Wt Readings from Last 3 Encounters:  02/22/23 134 lb 3.2 oz (60.9 kg)  07/08/22 154 lb 3.2 oz (69.9 kg)  01/07/22 150 lb (68 kg)    GEN: Thin, elderly, in no acute distress NECK: No JVD; No carotid bruits CARDIAC: RRR, no murmurs, rubs, gallops RESPIRATORY:  Clear to auscultation without rales, wheezing or rhonchi  ABDOMEN: Soft, non-tender, non-distended EXTREMITIES:  No edema; No deformity   ASSESSMENT AND PLAN: .    Coronary artery disease History of non-STEMI in 2020  treated with orbital atherectomy and DES to the LAD.  Cardiac catheterization in 2022 demonstrated a patent stent in the LAD, severe disease in the LCx and occluded OM and occluded RCA.  He is managed medically.  He is having increased DOE and awakening at times with bad dreams and chest tightness. Imdur helped in the past but can't take due to hypotension. Will try Ranexa 500 mg bid to see if this helps with early f/u with Dr. Anne Fu. No evidence of CHF on exam so will hold off on echo or NST to see if symptoms improve.   Hypercholesteremia Continue Atorvastatin 80 mg daily.  No recent LDL but not fasting   Hypotension BP stable. on Midodrine 5 mg three times a day.   Syncope and collapse No recurrence. No events and no AFib noted on ILR check 01/25/23.    Thoracic aortic aneurysm (HCC) 41 mm on CT done a [facility] in 05/2022. Stable. Continue with annual surveillance.    ESRD (end stage renal disease) on dialysis (HCC) Tues, Thurs, Sat dialysis.           Dispo: f/u in 3 months Dr. Anne Fu  Signed, Jacolyn Reedy, PA-C

## 2023-02-22 ENCOUNTER — Other Ambulatory Visit: Payer: Self-pay | Admitting: *Deleted

## 2023-02-22 ENCOUNTER — Ambulatory Visit: Payer: Medicare Other | Attending: Physician Assistant | Admitting: Physician Assistant

## 2023-02-22 ENCOUNTER — Encounter: Payer: Self-pay | Admitting: Physician Assistant

## 2023-02-22 VITALS — BP 108/62 | HR 95 | Ht 68.0 in | Wt 134.2 lb

## 2023-02-22 DIAGNOSIS — I7121 Aneurysm of the ascending aorta, without rupture: Secondary | ICD-10-CM | POA: Diagnosis present

## 2023-02-22 DIAGNOSIS — R55 Syncope and collapse: Secondary | ICD-10-CM | POA: Insufficient documentation

## 2023-02-22 DIAGNOSIS — I959 Hypotension, unspecified: Secondary | ICD-10-CM | POA: Diagnosis present

## 2023-02-22 DIAGNOSIS — N186 End stage renal disease: Secondary | ICD-10-CM | POA: Diagnosis present

## 2023-02-22 DIAGNOSIS — I25118 Atherosclerotic heart disease of native coronary artery with other forms of angina pectoris: Secondary | ICD-10-CM | POA: Insufficient documentation

## 2023-02-22 DIAGNOSIS — I259 Chronic ischemic heart disease, unspecified: Secondary | ICD-10-CM | POA: Insufficient documentation

## 2023-02-22 DIAGNOSIS — Z992 Dependence on renal dialysis: Secondary | ICD-10-CM | POA: Diagnosis present

## 2023-02-22 DIAGNOSIS — R0609 Other forms of dyspnea: Secondary | ICD-10-CM | POA: Insufficient documentation

## 2023-02-22 DIAGNOSIS — E7800 Pure hypercholesterolemia, unspecified: Secondary | ICD-10-CM | POA: Insufficient documentation

## 2023-02-22 DIAGNOSIS — E785 Hyperlipidemia, unspecified: Secondary | ICD-10-CM

## 2023-02-22 MED ORDER — RANOLAZINE ER 500 MG PO TB12: 500.0000 mg | ORAL_TABLET | Freq: Two times a day (BID) | ORAL | 0 refills | Status: DC

## 2023-02-22 MED ORDER — RANOLAZINE ER 500 MG PO TB12: 500.0000 mg | ORAL_TABLET | Freq: Two times a day (BID) | ORAL | 3 refills | Status: DC

## 2023-02-22 NOTE — Patient Instructions (Addendum)
Medication Instructions:  Your physician has recommended you make the following change in your medication:   START Ranexa 500 taking 1 twice a day  *If you need a refill on your cardiac medications before your next appointment, please call your pharmacy*   Lab Work: None ordered If you have labs (blood work) drawn today and your tests are completely normal, you will receive your results only by: MyChart Message (if you have MyChart) OR A paper copy in the mail If you have any lab test that is abnormal or we need to change your treatment, we will call you to review the results.   Testing/Procedures: None ordered   Follow-Up: At [facility] you and your health needs are our priority.  As part of our continuing mission to provide you with exceptional heart care, we have created designated Provider Care Teams.  These Care Teams include your primary Cardiologist (physician) and Advanced Practice Providers (APPs -  Physician Assistants and Nurse Practitioners) who all work together to provide you with the care you need, when you need it.  We recommend signing up for the patient portal called "MyChart".  Sign up information is provided on this After Visit Summary.  MyChart is used to connect with patients for Virtual Visits (Telemedicine).  Patients are able to view lab/test results, encounter notes, upcoming appointments, etc.  Non-urgent messages can be sent to your provider as well.   To learn more about what you can do with MyChart, go to [email or web address].    Your next appointment:   3-4 month(s)  Provider:   Donato Schultz, MD     Other Instructions   1st Floor: - Lobby - Registration  - Pharmacy  - Lab - Cafe  2nd Floor: - PV Lab - Diagnostic Testing (echo, CT, nuclear med)  3rd Floor: - Vacant  4th Floor: - TCTS (cardiothoracic surgery) - AFib Clinic - Structural Heart Clinic - Vascular Surgery  - Vascular Ultrasound  5th Floor: - HeartCare  Cardiology (general and EP) - Clinical Pharmacy for coumadin, hypertension, lipid, weight-loss medications, and med management appointments    Valet parking services will be available as well.

## 2023-02-27 ENCOUNTER — Ambulatory Visit (INDEPENDENT_AMBULATORY_CARE_PROVIDER_SITE_OTHER): Payer: Medicare Other

## 2023-02-27 DIAGNOSIS — R55 Syncope and collapse: Secondary | ICD-10-CM | POA: Diagnosis not present

## 2023-03-01 LAB — CUP PACEART REMOTE DEVICE CHECK
Date Time Interrogation Session: 20250204091208
Implantable Pulse Generator Implant Date: 20210412

## 2023-03-09 NOTE — Progress Notes (Signed)
Carelink Summary Report / Loop Recorder

## 2023-04-03 ENCOUNTER — Ambulatory Visit (INDEPENDENT_AMBULATORY_CARE_PROVIDER_SITE_OTHER): Payer: Medicare Other

## 2023-04-03 DIAGNOSIS — R55 Syncope and collapse: Secondary | ICD-10-CM | POA: Diagnosis not present

## 2023-04-04 LAB — CUP PACEART REMOTE DEVICE CHECK
Date Time Interrogation Session: 20250309230247
Implantable Pulse Generator Implant Date: 20210412

## 2023-04-05 NOTE — Progress Notes (Signed)
 Carelink Summary Report / Loop Recorder

## 2023-04-05 NOTE — Addendum Note (Signed)
 Addended by: Geralyn Flash D on: 04/05/2023 11:23 AM   Modules accepted: Orders

## 2023-05-08 ENCOUNTER — Ambulatory Visit (INDEPENDENT_AMBULATORY_CARE_PROVIDER_SITE_OTHER): Payer: Medicare Other

## 2023-05-08 DIAGNOSIS — R55 Syncope and collapse: Secondary | ICD-10-CM | POA: Diagnosis not present

## 2023-05-08 NOTE — Addendum Note (Signed)
 Addended by: Edra Govern D on: 05/08/2023 10:46 AM   Modules accepted: Orders

## 2023-05-08 NOTE — Progress Notes (Signed)
 Carelink Summary Report / Loop Recorder

## 2023-05-09 LAB — CUP PACEART REMOTE DEVICE CHECK
Date Time Interrogation Session: 20250413230214
Implantable Pulse Generator Implant Date: 20210412

## 2023-05-29 ENCOUNTER — Ambulatory Visit: Payer: Medicare Other | Attending: Cardiology | Admitting: Cardiology

## 2023-05-30 ENCOUNTER — Encounter: Payer: Self-pay | Admitting: Cardiology

## 2023-06-12 ENCOUNTER — Ambulatory Visit (INDEPENDENT_AMBULATORY_CARE_PROVIDER_SITE_OTHER): Payer: Medicare Other

## 2023-06-12 DIAGNOSIS — R55 Syncope and collapse: Secondary | ICD-10-CM

## 2023-06-12 LAB — CUP PACEART REMOTE DEVICE CHECK
Date Time Interrogation Session: 20250518230925
Implantable Pulse Generator Implant Date: 20210412

## 2023-06-19 ENCOUNTER — Ambulatory Visit: Payer: Self-pay | Admitting: Cardiovascular Disease

## 2023-06-26 NOTE — Progress Notes (Signed)
 Carelink Summary Report / Loop Recorder

## 2023-06-26 NOTE — Addendum Note (Signed)
 Addended by: Edra Govern D on: 06/26/2023 05:45 PM   Modules accepted: Orders

## 2023-06-29 NOTE — Progress Notes (Unsigned)
 Cardiology Office Note    Patient Name: Nicholas [name] Date of Encounter: 06/30/2023  Primary Care Provider:  Kinnie Penton, MD Primary Cardiologist:  Dorothye Gathers, MD Primary Electrophysiologist: Richardo Chandler, MD   Past Medical History    Past Medical History:  Diagnosis Date   Anemia    Arthritis    AV block    CAD (coronary artery disease)    Chronic kidney disease    Chronic respiratory failure (HCC)    COPD (chronic obstructive pulmonary disease) (HCC)    Diabetes mellitus (HCC)    Dialysis patient (HCC)    End stage renal disease (HCC)    GERD (gastroesophageal reflux disease)    occ   GI bleeding    Heart murmur    History of loop recorder    Hypercholesteremia    Hypertension    Psoriasis    Sinus bradycardia    Syncope    Thoracic aortic aneurysm (HCC) 07/08/2022   CT 07/2020: 41 mm // CT ([facility]) 05/2022: 41 mm    History of Present Illness  Nicholas [name] is a 89 y.o. male with a PMH of CAD s/p NSTEMI 07/2018 with arthrectomy and PCI/DES to pLAD and repeat LHC 05/2020 with patent pLAD stent and 30% ISR with jailed D1 treated in LAD with 50 to 70% NP LCx 80% with OM1 100% stenosis with left to left collaterals and 100% stenosed RCA with right to right and left-to-right collaterals,ESRD on HD TTS, IDT2DM, HTN, HLD, anemia, HFrEF, right nephrectomy D/T renal cell tumor, COPD, syncope s/p ILR, Left MCA CVA in 03/2021 who presents today for 74-month follow-up.  [name] was seen initially in 2020 by Dr. Felipe Horton with complaint of chest pain and underwent LHC after being diagnosed with NSTEMI showing three-vessel CAD with total occlusion of RCA and dominant OM of the circumflex.  CVTS was consulted however patient decided against bypass surgery and underwent orbital arthrectomy and PCI with DES to pLAD with D1 jailed by stent.  2D echo was completed showing reduced EF of 35 as 40% with wall motion abnormalities.  GDMT was limited due to ESRD and patient was continued on low-dose  beta-blocker.  He was seen in the ED on 03/18/2019 with complaint of syncope.  He was noted to be hypotensive with possible cerebral hypoperfusion from anemia.  He was evaluated by Dr. Rodolfo Clan on 05/06/19 and underwent loop recorder implant.  He was seen in the ED on 06/04/2020 with complaint of substernal chest pain and underwent repeat LHC on 06/05/2020 that showed patent pLAD stent with 30% ISR and jailed D1 with 1% collaterals and M LAD 50-70% with P LCx 80% and OM with 1% left to left collaterals and RCA with 100% right to right and left-to-right collaterals treated medically.  He establish care with Dr. Renna Cary following Dr. Shirlie Dove retirement.  He was last seen by Herbert Loh, PA on 02/22/2023 and reported worsening DOE on O2 while walking to bathroom or car.  He reported heaviness in his chest with bad dreams which Imdur helped however discontinued due to hypotension.  He was still taking midodrine  as prescribed.  He was seen by his pulmonologist on 05/12/2023 and received samples of Breztri.  [name] presents today with his wife for 29-month follow-up. He experiences episodes of chest heaviness primarily when startled awake from sleep, often due to bad dreams. These episodes are described as a discomfort in the chest that feels like 'something is melting' and typically resolve within three to  five minutes. No symptoms occur during daytime activities or while awake. He has a history of shortness of breath, particularly when walking to the car or bathroom. He uses a walker to assist with mobility and notes that his ability to walk is limited by distance and speed. Some improvement in breathing has been noted since starting Breztri, an inhaler prescribed by his pulmonologist. He has a history of coronary artery disease with previous blockages and is monitored with a loop recorder. He was previously on Imdur (isosorbide ) but discontinued it due to low blood pressure. He carries nitroglycerin  tablets for  emergencies. His blood pressure is monitored during dialysis sessions, and it tends to be low. He has not been tested for sleep apnea and denies waking up gasping for air or experiencing shortness of breath upon waking. He is on midodrine , taken three times a day, and does not hold any medications prior to dialysis. No significant swelling, though minor swelling in the feet was noted by a PA at dialysis. Patient denies chest pain, palpitations, dyspnea, PND, orthopnea, nausea, vomiting, dizziness, syncope, edema, weight gain, or early satiety.  Discussed the use of AI scribe software for clinical note transcription with the patient, who gave verbal consent to proceed.  History of Present Illness   Review of Systems  Please see the history of present illness.    All other systems reviewed and are otherwise negative except as noted above.  Physical Exam     Wt Readings from Last 3 Encounters:  06/30/23 138 lb (62.6 kg)  02/22/23 134 lb 3.2 oz (60.9 kg)  07/08/22 154 lb 3.2 oz (69.9 kg)   VS: Vitals:   06/30/23 1333  BP: (!) 94/52  Pulse: (!) 55  SpO2: 99%  ,Body mass index is 21.61 kg/m. GEN: Well nourished, well developed in no acute distress Neck: No JVD; No carotid bruits Pulmonary: Clear to auscultation without rales, wheezing or rhonchi  Cardiovascular: Normal rate. Regular rhythm. Normal S1. Normal S2.   Murmurs: There is no murmur.  ABDOMEN: Soft, non-tender, non-distended EXTREMITIES:  No edema; No deformity   EKG/LABS/ Recent Cardiac Studies   ECG personally reviewed by me today -none completed today  Risk Assessment/Calculations:          Lab Results  Component Value Date   WBC 8.2 10/24/2021   HGB 7.9 (L) 10/24/2021   HCT 26.1 (L) 10/24/2021   MCV 97.0 10/24/2021   PLT 304 10/24/2021   Lab Results  Component Value Date   CREATININE 6.24 (H) 10/24/2021   BUN 25 (H) 10/24/2021   NA 135 10/24/2021   K 3.7 10/24/2021   CL 93 (L) 10/24/2021   CO2 25  10/24/2021   Lab Results  Component Value Date   CHOL 198 04/16/2021   HDL 65 04/16/2021   LDLCALC 116 (H) 04/16/2021   LDLDIRECT 131 (H) 07/08/2022   TRIG 87 04/16/2021   CHOLHDL 3.0 04/16/2021    Lab Results  Component Value Date   HGBA1C 7.0 (H) 05/15/2021   Assessment & Plan    Assessment & Plan  1.  CAD: -s/p NSTEMI 07/2018 with arthrectomy and PCI/DES to pLAD and repeat LHC 05/2020 with patent pLAD stent and 30% ISR with jailed D1 treated in LAD with 50 to 70% NP LCx 80% with OM1 100% stenosis with left to left collaterals and 100% stenosed RCA with right to right and left-to-right collaterals, -No symptoms of chest pain or arrhythmias. Loop recorder showed no arrhythmia episodes. Chest heaviness  not cardiac-related.  2.  HLD: - Lipids managed by PCP at [facility] - Continue Zetia  10 mg and Lipitor  80 mg daily  3.  Hypotension: - Patient blood pressure today is 94/52 and is currently asymptomatic - Continue midodrine  5 mg 3 times daily  4.  ESRD: -Volume currently managed by nephrology - Tuesday, Thursday, Saturday dialysis  5.  History of thoracic aneurysm: - Currently followed by [facility] with annual surveillance CT   Disposition: Follow-up with Dorothye Gathers, MD or APP in 6 months    Signed, Francene Ing, Retha Cast, NP 06/30/2023, 2:29 PM [facility]

## 2023-06-30 ENCOUNTER — Encounter: Payer: Self-pay | Admitting: Nurse Practitioner

## 2023-06-30 ENCOUNTER — Ambulatory Visit: Attending: Nurse Practitioner | Admitting: Nurse Practitioner

## 2023-06-30 VITALS — BP 94/52 | HR 55 | Ht 67.0 in | Wt 138.0 lb

## 2023-06-30 DIAGNOSIS — E7800 Pure hypercholesterolemia, unspecified: Secondary | ICD-10-CM | POA: Insufficient documentation

## 2023-06-30 DIAGNOSIS — I25118 Atherosclerotic heart disease of native coronary artery with other forms of angina pectoris: Secondary | ICD-10-CM | POA: Diagnosis present

## 2023-06-30 DIAGNOSIS — N186 End stage renal disease: Secondary | ICD-10-CM | POA: Insufficient documentation

## 2023-06-30 DIAGNOSIS — I7121 Aneurysm of the ascending aorta, without rupture: Secondary | ICD-10-CM | POA: Insufficient documentation

## 2023-06-30 DIAGNOSIS — Z992 Dependence on renal dialysis: Secondary | ICD-10-CM | POA: Diagnosis present

## 2023-06-30 DIAGNOSIS — E785 Hyperlipidemia, unspecified: Secondary | ICD-10-CM | POA: Diagnosis present

## 2023-06-30 NOTE — Patient Instructions (Signed)
 Medication Instructions:  Your physician recommends that you continue on your current medications as directed. Please refer to the Current Medication list given to you today.    Follow-Up: At [facility], you and your health needs are our priority.  As part of our continuing mission to provide you with exceptional heart care, our providers are all part of one team.  This team includes your primary Cardiologist (physician) and Advanced Practice Providers or APPs (Physician Assistants and Nurse Practitioners) who all work together to provide you with the care you need, when you need it.  Your next appointment:   6 month(s)  Provider:   Dorothye Gathers, MD or Charles Connor, NP        We recommend signing up for the patient portal called "MyChart".  Sign up information is provided on this After Visit Summary.  MyChart is used to connect with patients for Virtual Visits (Telemedicine).  Patients are able to view lab/test results, encounter notes, upcoming appointments, etc.  Non-urgent messages can be sent to your provider as well.   To learn more about what you can do with MyChart, go to [email or web address].

## 2023-07-13 ENCOUNTER — Ambulatory Visit

## 2023-07-13 DIAGNOSIS — R55 Syncope and collapse: Secondary | ICD-10-CM | POA: Diagnosis not present

## 2023-07-13 LAB — CUP PACEART REMOTE DEVICE CHECK
Date Time Interrogation Session: 20250618230706
Implantable Pulse Generator Implant Date: 20210412

## 2023-07-14 ENCOUNTER — Ambulatory Visit: Payer: Self-pay | Admitting: Cardiovascular Disease

## 2023-07-31 NOTE — Progress Notes (Signed)
 Carelink Summary Report / Loop Recorder

## 2023-08-14 ENCOUNTER — Ambulatory Visit

## 2023-08-14 DIAGNOSIS — R55 Syncope and collapse: Secondary | ICD-10-CM | POA: Diagnosis not present

## 2023-08-15 LAB — CUP PACEART REMOTE DEVICE CHECK
Date Time Interrogation Session: 20250720230758
Implantable Pulse Generator Implant Date: 20210412

## 2023-08-20 ENCOUNTER — Ambulatory Visit: Payer: Self-pay | Admitting: Cardiovascular Disease

## 2023-09-14 ENCOUNTER — Ambulatory Visit (INDEPENDENT_AMBULATORY_CARE_PROVIDER_SITE_OTHER)

## 2023-09-14 DIAGNOSIS — R55 Syncope and collapse: Secondary | ICD-10-CM

## 2023-09-14 LAB — CUP PACEART REMOTE DEVICE CHECK
Date Time Interrogation Session: 20250820230720
Implantable Pulse Generator Implant Date: 20210412

## 2023-09-14 NOTE — Progress Notes (Signed)
 Carelink Summary Report / Loop Recorder

## 2023-09-18 ENCOUNTER — Ambulatory Visit: Payer: Self-pay | Admitting: Cardiovascular Disease

## 2023-10-16 ENCOUNTER — Ambulatory Visit (INDEPENDENT_AMBULATORY_CARE_PROVIDER_SITE_OTHER)

## 2023-10-16 DIAGNOSIS — R55 Syncope and collapse: Secondary | ICD-10-CM

## 2023-10-16 LAB — CUP PACEART REMOTE DEVICE CHECK
Date Time Interrogation Session: 20250921230751
Implantable Pulse Generator Implant Date: 20210412

## 2023-10-17 NOTE — Progress Notes (Signed)
 Remote Loop Recorder Transmission

## 2023-10-18 ENCOUNTER — Ambulatory Visit: Payer: Self-pay | Admitting: Cardiovascular Disease

## 2023-10-30 NOTE — Progress Notes (Signed)
 Remote Loop Recorder Transmission

## 2023-11-15 ENCOUNTER — Encounter

## 2023-11-16 ENCOUNTER — Encounter

## 2023-11-16 ENCOUNTER — Ambulatory Visit

## 2023-11-16 DIAGNOSIS — R55 Syncope and collapse: Secondary | ICD-10-CM

## 2023-11-16 LAB — CUP PACEART REMOTE DEVICE CHECK
Date Time Interrogation Session: 20251022230429
Implantable Pulse Generator Implant Date: 20210412

## 2023-11-18 NOTE — Progress Notes (Signed)
 Remote Loop Recorder Transmission

## 2023-11-29 ENCOUNTER — Ambulatory Visit: Payer: Self-pay | Admitting: Cardiovascular Disease

## 2023-12-16 ENCOUNTER — Encounter

## 2023-12-17 ENCOUNTER — Ambulatory Visit

## 2023-12-18 ENCOUNTER — Encounter

## 2023-12-20 ENCOUNTER — Ambulatory Visit

## 2023-12-20 DIAGNOSIS — I442 Atrioventricular block, complete: Secondary | ICD-10-CM | POA: Diagnosis not present

## 2023-12-20 LAB — CUP PACEART REMOTE DEVICE CHECK
Date Time Interrogation Session: 20251125230122
Implantable Pulse Generator Implant Date: 20210412

## 2023-12-22 NOTE — Progress Notes (Signed)
 Remote Loop Recorder Transmission

## 2023-12-26 ENCOUNTER — Ambulatory Visit: Payer: Self-pay | Admitting: Cardiovascular Disease

## 2024-01-13 NOTE — Progress Notes (Signed)
 Physician's Medical Necessity Certification Form (Required for Medicare / Medicaid Beneficiaries for non-emergency, scheduled or non-scheduled, ambulance transportation)  Ambulance Company and Contact #: [facility]  [phone])  Patient's Name: Nicholas [name] Social Security Number: [id number] Address: [address] [place] [address] Phone Number: [phone] (home)  Transfer Information  Date of Service: 01/13/24 Origin of Transport:  [facility] Transport Destination:  [phone] Phone: [phone] City: [place] State:  Please Check: Residence  Medical Guidelines  Medicare Regulations, Part 410.40 (d)(1) defines Medical Necessity and Bed Confined In order for ambulance service to be covered, they must be medically necessary and reasonable.  Medical necessity is established when the patients' condition is such that transportation by other means is contraindicated.  The Health Care Financing Administration has defined Bed Confinement as: Patient is unable to get up from bed without assistance Patient is unable to ambulate Patient is unable to sit in a chair or wheelchair  If the said patient does not meet Bed Confined criteria as defined in the Medicare regulation, can this patient be transported safely unmonitored in a wheelchair van?  No  If No, please indicate the appropriate medical condition(s):  Severe Weakness, Breathing Difficulties (Acute respiratory failure with hypoxia requiring 5L O2, CKD, DJD)  ---------------------------------------------------------------------------------------------------------------------- I certify that the above information is true and correct based on my evaluation of this patient to the best of my knowledge and professional training, as supported in the medical record of this patient.   I understand that this information will be used by the Department of Health and  Health And Safety Inspector, Healthcare Financing Administration to support the determination of medical necessity for ambulance services.  I certify that the above information is true and correct based on my evaluation of this patient to the best of my knowledge and professional training, as supported in the medical record of this patient.  It is understood that I, the Medical Professional; being a Designer, Jewellery, Case Designer, Television/film Set, has received a verbal order from the assigned physician, being Dr. Lucille Olives on the Date of 01/13/2024, to authorize the medical necessity for the transport of the said patient above.  I understand that this information will be used by the Department of Health and Health And Safety Inspector, Healthcare Financing Administration to support the determination of medical necessity for ambulance services.    Signature of Medical Professional:  Bernice Mania, RN Date:  01/13/2024

## 2024-01-13 NOTE — Discharge Summary (Addendum)
 [facility] Hospitalist Discharge Summary  Identifying Information:  Nicholas [name] November 10, 1930 [id number]  Admit date: 01/11/2024  Discharge date: 01/13/2024   Discharge Service: [facility] Hospitalist  Discharge Attending Physician:Basavatti Madappa Sowmya*  Discharge to: Home with home health PT  Discharge Diagnoses:    Acute respiratory failure with hypoxia on chronic hypoxic respiratory failure with home oxygen  3 to 4 L/min   (CMD)    Essential hypertension   Type 2 diabetes mellitus with kidney complication, without long-term current use of insulin     (CMD)   ESRD (end stage renal disease) on dialysis    (CMD)   Dyslipidemia   Coronary artery disease of native artery of native heart with stable angina pectoris   Iron deficiency anemia due to chronic blood loss   Hospital Course:   Belal Scallon is a 89 year old male with a history of end-stage renal disease on hemodialysis, chronic hypoxic respiratory failure, diabetes mellitus, coronary artery disease, chronic heart failure, hypertension, and chronic debility who was admitted on 01/11/2024 for acute respiratory failure with hypoxia, presenting with lethargy, weakness, and missed dialysis. On arrival, he was found to be hypoxic with SpO2 in the 80s on room air, requiring BiPAP and subsequently 3-4 L/min of supplemental oxygen , and was noted to have missed his scheduled dialysis session.  Evaluation included serial arterial blood gases showing improvement in pH and pCO2, a chest X-ray revealing interval increased diffuse bilateral interstitial opacities and moderate right and small left pleural effusions, and laboratory studies notable for elevated BNP, creatinine, and BUN, as well as anemia. Nephrology was consulted and hemodialysis was initiated in the ED, with subsequent sessions coordinated per his usual schedule. He was weaned off BiPAP and maintained on his baseline oxygen  requirements.  During hospitalization, his acute respiratory failure  was attributed to fluid overload, which improved following dialysis. He remained on 3-4 L/min nasal cannula and was monitored for respiratory status, with no further episodes of respiratory distress reported after initial management. Steroids were discontinued as his presentation was not consistent with a COPD exacerbation.  Additional issues addressed during admission included hypotension, which was noted during dialysis and managed by discontinuing ultrafiltration; his blood pressure remained soft but stable. He was continued on sliding scale insulin  for diabetes management and received DVT prophylaxis with subcutaneous heparin . His chronic anemia was monitored, and he remained anuric throughout the admission.  A stage 3 pressure injury to the left buttock was identified and managed with wound care interventions, including Mepilex border dressings and pressure redistribution strategies. Severe protein-calorie malnutrition was diagnosed by the nutrition team, with significant weight loss documented over the preceding two months; Nepro shakes were added to his regimen and ongoing nutritional monitoring was recommended.  Physical therapy evaluation revealed significant decline in functional status over the prior months, with the patient requiring moderate to maximal assistance for all mobility and ADLs, and recommendations were made for continued therapy and home health services upon discharge. Discharge planning included arrangements for home health nursing, physical and occupational therapy, and durable medical equipment as needed, with the anticipated disposition to home with family support.  On 01/13/2024 patient was doing well he was at his baseline, he underwent dialysis as his blood pressure was low his ultrafiltration was discontinued his blood pressure was stable postdialysis and patient was doing well was medically stable and will be discharged home.  I have discussed discharge plan detail patient  verbalized understanding   _____________________________________________________________________________ Discharge Day Services: BP (!) 96/51 (BP Location: Right arm, Patient Position: Lying)  Pulse 85   Temp 97.3 F (36.3 C) (Oral)   Resp 15   Ht 1.727 m (5' 8)   Wt 54.4 kg (119 lb 14.9 oz)   SpO2 100%   BMI 18.24 kg/m  Pt seen on the day of discharge and determined appropriate for discharge. GEN: NAD, lying in bed, on 4 L of nasal cannula EYES: EOMI ENT: MMM CV: RRR PULM: CTA B ABD: soft, NT/ND, +BS EXT: No edema, left upper extremity AV fistula present with bruit and thrill    Condition at Discharge: stable  Length of Discharge: I spent 40 mins in the discharge of this patient. _____________________________________________________________________________ Discharge Medications:   Medication List     START taking these medications    Breztri Aerosphere 160-9-4.8 mcg/actuation inhaler Generic drug: budesonide -glycopyr-formoterol Inhale 2 puffs 2 (two) times a day. Rinse mouth after use.       CONTINUE taking these medications    acetaminophen  325 mg tablet Commonly known as: TYLENOL  Take 650 mg by mouth every 6 (six) hours as needed.   albuterol HFA 90 mcg/actuation inhaler Commonly known as: PROVENTIL HFA;VENTOLIN HFA;PROAIR HFA Inhale 2 puffs every 4 (four) hours as needed for wheezing.   atorvastatin  80 mg tablet Commonly known as: LIPITOR  Take 80 mg by mouth at bedtime.   clopidogreL  75 mg tablet Commonly known as: PLAVIX  Take 75 mg by mouth daily.   docusate sodium  100 mg capsule Commonly known as: COLACE Take 100 mg by mouth daily.   glucose intolerance nutritional shake Liqd liquid Commonly known as: GLUCERNA Take 1 Bottle by mouth daily.   lanthanum 1,000 mg chewable tablet Commonly known as: FOSRENOL Chew 1,000 mg in the morning and 1,000 mg at noon and 1,000 mg in the evening. Chew with meals.   midodrine  5 mg tablet Commonly known  as: PROAMATINE  Take 5 mg by mouth 3 (three) times a day.   nitroglycerin  0.4 mg SL tablet Commonly known as: NITROSTAT  Place 0.4 mg under the tongue every 5 (five) minutes as needed for chest pain.       STOP taking these medications    azithromycin 250 mg tablet Commonly known as: ZITHROMAX       Pending Test Results (if blank, then none): @PRINTGROUPPENDLAB @  Most Recent Labs:     Lab Results  Component Value Date   WBC 7.70 01/11/2024   HGB 9.8 (L) 01/11/2024   HCT 31.4 (L) 01/11/2024   PLT 290 01/11/2024    Lab Results  Component Value Date   CO2 31 01/13/2024   BUN 48 (H) 01/13/2024   GLUCOSE 149 (H) 01/13/2024   CREATININE 5.13 (H) 01/13/2024   CALCIUM  9.1 01/13/2024   ALBUMIN 3.8 01/11/2024   AST 12 (L) 01/11/2024   ALT 11 01/11/2024    Lab Results  Component Value Date   NA 136 01/13/2024   K 3.9 01/13/2024   CL 97 (L) 01/13/2024   CO2 31 01/13/2024   BUN 48 (H) 01/13/2024   CREATININE 5.13 (H) 01/13/2024   CALCIUM  9.1 01/13/2024   MG 2.5 01/13/2024   PHOS 6.2 (H) 01/13/2024    Lab Results  Component Value Date   BILITOT 0.4 01/11/2024   PROT 7.2 01/11/2024   ALBUMIN 3.8 01/11/2024   ALT 11 01/11/2024   AST 12 (L) 01/11/2024    No results found for: PT, INR, APTT Hospital Radiology:  XR Chest 1 View Result Date: 01/11/2024 XR CHEST 1 VIEW, 01/11/2024 1:11 PM INDICATION:shob COMPARISON: Chest radiograph 11/15/2023  FINDINGS: Supportive devices: Cardiac noted in the left chest wall. Cardiovascular/lungs/pleura: Cardiac silhouette is partially obscured and presumably enlarged. Interval increased conspicuity of heterogeneous opacities in the lower lung on a background of similar diffuse bilateral interstitial opacity. Bibasilar atelectasis. Moderate right and small left pleural effusions. Other: No interval osseous change.   *  Interval increased conspicuity of diffuse bilateral interstitial opacity, considerations include chronic lung  changes versus pulmonary interstitial edema versus combination of both. *  Similar cardiomegaly.   ECG 12 lead Result Date: 01/11/2024 Ventricular Rate                   93        BPM                 Atrial Rate                        93        BPM                 P-R Interval                       178       ms                  QRS Duration                       124       ms                  Q-T Interval                       368       ms                  QTC Calculation Bazett             457       ms                  Calculated P Axis                  36        degrees             Calculated R Axis                  -33       degrees             Calculated T Axis                  95        degrees             Sinus rhythm Left axis deviation Left bundle branch block When compared with ECG of 31-Jan-2018 13 37, Left bundle branch block now present Confirmed by Rojelio Dunnings  [phone]  on 01-11-2024 1 29 39 PM  Holter monitor - 3-7 days Result Date: 12/20/2023 This result has an attachment that is not available. ILR summary report received. Battery status OK. Normal device function. No new symptom, tachy, brady, or pause episodes. No new AF episodes. Monthly summary reports and ROV/PRN - CS, CVRS   _____________________________________________________________________________ Discharge Instructions:   Discharge Orders     Discharge Diet (specify)     Details:  Diet type: Renal for Dialysis   Discharge instructions - Other (specify)     Comments: Follow-up with primary care physician as outpatient in 1 to 2 weeks.  Continue home oxygen  3 to 4 L/min.  Continue hemodialysis 3 times per week per his outpatient schedule] (Tuesday Thursday Saturday)  Cleanse buttock wound with VASHE wound cleanser.  Mepilex border to sacrum including left buttock and bilateral heels to provide some pressure redistribution, to protect from shear and friction, and to promote healing, change Monday, Wednesday, Friday,  and prn soiling.  Waffle cushion for pressure redistribution.   Full Code     Home Health Skilled Nursing     Details:    Home Health Skilled Nursing Service:  Medication Cardiopulmonary Assessment Education     Medication Education: Observation and Assessment with Medication Education and Mgmt   Education Needed: Disease Process Education   Occupational Therapy Home Health Coordination     Details:    Actions: Resume Home Health   Physical Therapy Home Health Coordination     Details:    Actions: Resume Home Health      Discharge Orders     Discharge Diet (specify)     Details:    Diet type: Renal for Dialysis   Discharge instructions - Other (specify)     Comments: Follow-up with primary care physician as outpatient in 1 to 2 weeks.  Continue home oxygen  3 to 4 L/min.  Continue hemodialysis 3 times per week per his outpatient schedule] (Tuesday Thursday Saturday)  Cleanse buttock wound with VASHE wound cleanser.  Mepilex border to sacrum including left buttock and bilateral heels to provide some pressure redistribution, to protect from shear and friction, and to promote healing, change Monday, Wednesday, Friday, and prn soiling.  Waffle cushion for pressure redistribution.   Full Code     Home Health Skilled Nursing     Details:    Home Health Skilled Nursing Service:  Medication Cardiopulmonary Assessment Education     Medication Education: Observation and Assessment with Medication Education and Mgmt   Education Needed: Disease Process Education   Occupational Therapy Home Health Coordination     Details:    Actions: Resume Home Health   Physical Therapy Home Health Coordination     Details:    Actions: Resume Home Health        @PATFUTUREAPPT @  Predictive Model Details        21.8% (Medium)  Factor Value   Calculated 01/13/2024 08:04 12% Braden score 15   Readmission Risk Score v2 Model 9% Latest hemoglobin in last 72 hrs 9.8 g/dL    7% Number of ED visits  in last 90 days 2    6% Latest BUN in last 72 hrs 27 mg/dL    6% Number of appointments in last 90 days 4

## 2024-01-13 NOTE — Progress Notes (Signed)
" °   01/13/24 1153  Vital Signs  Temp 97.5 F (36.4 C)  Temp Source Oral  Heart Rate 81  Resp 16  BP (!) 105/42  BP Location Right arm  BP Method Automatic  Patient Position Lying  SpO2 100 %  O2 Device Nasal cannula  O2 Flow Rate (L/min) 5 L/min  Post-Hemodialysis Assessment  End Time 1125  Rinseback Volume (mL) 250 mL  Actual Treatment Time (minutes) 180 minutes  Minutes Short From Prescription 0  EPO Given No  Weight 54.4 kg (119 lb 14.9 oz)  Weight Obtained By Bed scale  Patient Position for Weight Lying  Weight Change % -0.55  Hemodialysis Intake (mL) 500 mL  Hemodialysis Output (Volume Removed) mL 30 mL  Crit-Line Used No  Hemodialysis Handoff Brittany C Sweat-Grispino (via secure chat)  Post-Hemodialysis Comments Treatment complete goal not met due to low BP Albumin 12.5 administered.  Duration of Treatment (minutes) 188 minutes  Patient Assessment  Best Eye Response Spontaneous  Best Verbal Response Oriented  Best Motor Response Follows commands  Glasgow Coma Scale Score 15  Bowel Sounds (All Quadrants) Present  Hemodialysis Arteriovenous Fistula 11/11/23 Upper arm  Placement Date/Time: (c) 11/11/23 (c)    Orientation: Left  Access Location: Upper arm  AV Fistula Present;Thrill;Bruit  Site Assessment Clean;Dry;Intact  Status Deaccessed    "

## 2024-01-14 NOTE — Progress Notes (Signed)
 Case Management Discharge Note        CSN: [id number] DOB: 1930/11/05 Service: General Medicine Location: 616/01  Patient Class: Inpatient  DC Disposition: : Home Health Care  Discharge DC Disposition: : Home Health Care Homecare Referral: (OT) Occupational Therapy, (PT) Physical Therapy, (SN) Skilled Nursing Homecare/Hospice Agency(s) chosen: [facility] Discharge Transport: Ambulance Discharge Transport Agency Chosen: [facility]  [phone])  Discharge Referrals Patient Preference: Chosen geographical local area/county shared with patient/family: Return/previous involvement Patient Preference for Post-Acute Provider Form completed: Return/Previous Involvement Case closed, patient/family agree with disposition plan: Yes  Patient discharged home. CM notified Zebedee with [facility], [phone]. Per previous CM note, family holding off on ordering hospital bed and hoyer lift.    Case Management Coordination Status: Coordination Complete     Bernice Mania, RN

## 2024-01-15 NOTE — Telephone Encounter (Signed)
 Copied from CRM [id number]. Topic: Deceased Patient - Deceased Patient >> February 04, 2024  9:25 AM Mitzie FALCON wrote: Jenna, [facility] is calling other request    Include all details related to the request(s) below:  Jenna, called in needing a time of death signature, Transfer to [employer] and type the [employer] Number below:  Patient/caller contact number:             [] Home  [] Mobile  [] Work [] Other   [] Okay to leave a voicemail   Medication List:  Current Outpatient Medications:    acetaminophen  (TYLENOL ) 325 mg tablet, Take 650 mg by mouth every 6 (six) hours as needed., Disp: , Rfl:    albuterol HFA (PROVENTIL HFA;VENTOLIN HFA;PROAIR HFA) 90 mcg/actuation inhaler, Inhale 2 puffs every 4 (four) hours as needed for wheezing., Disp: , Rfl:    atorvastatin  (LIPITOR ) 80 mg tablet, Take 80 mg by mouth at bedtime., Disp: , Rfl:    budesonide -glycopyr-formoterol (Breztri Aerosphere) 160-9-4.8 mcg/actuation inhaler, Inhale 2 puffs 2 (two) times a day. Rinse mouth after use., Disp: 1 each, Rfl: 5   clopidogreL  (PLAVIX ) 75 mg tablet, Take 75 mg by mouth daily., Disp: , Rfl:    docusate sodium  (COLACE) 100 mg capsule, Take 100 mg by mouth daily., Disp: , Rfl:    glucose intolerance nutritional shake (GLUCERNA) liqd liquid, Take 1 Bottle by mouth daily., Disp: , Rfl:    lanthanum (FOSRENOL) 1,000 mg chewable tablet, Chew 1,000 mg in the morning and 1,000 mg at noon and 1,000 mg in the evening. Chew with meals., Disp: , Rfl:    midodrine  (PROAMATINE ) 5 mg tablet, Take 5 mg by mouth 3 (three) times a day., Disp: , Rfl:    nitroglycerin  (NITROSTAT ) 0.4 mg SL tablet, Place 0.4 mg under the tongue every 5 (five) minutes as needed for chest pain., Disp: , Rfl:      Medication Request/Refills: Pharmacy Information (if applicable)   [] Not Applicable       []  Pharmacy listed  Send Medication Request to:                                                 [] Pharmacy not listed  (added to pharmacy list in Epic) Send Medication Request to:      Listed Pharmacies: [facility] - [place], [place] - [address] - PHONE: [phone] - FAX: [phone] [id number] - [place], [place] - [address] - PHONE: [phone] - FAX: [phone]

## 2024-01-16 ENCOUNTER — Encounter

## 2024-01-18 ENCOUNTER — Encounter

## 2024-01-20 ENCOUNTER — Ambulatory Visit

## 2024-01-25 DEATH — deceased

## 2024-02-16 ENCOUNTER — Encounter

## 2024-02-19 ENCOUNTER — Encounter

## 2024-02-20 ENCOUNTER — Ambulatory Visit

## 2024-03-18 ENCOUNTER — Encounter

## 2024-03-21 ENCOUNTER — Encounter

## 2024-04-22 ENCOUNTER — Encounter

## 2024-05-23 ENCOUNTER — Encounter

## 2024-06-24 ENCOUNTER — Encounter

## 2024-07-25 ENCOUNTER — Encounter

## 2024-08-26 ENCOUNTER — Encounter
